# Patient Record
Sex: Female | Born: 1950 | Race: White | Hispanic: No | Marital: Single | Smoking: Never smoker
Health system: Southern US, Community
[De-identification: ages and names within clinical notes are randomized; demographics above are authoritative.]

## PROBLEM LIST (undated history)

## (undated) ENCOUNTER — Ambulatory Visit: Admission: EM

## (undated) DIAGNOSIS — M545 Low back pain, unspecified: Secondary | ICD-10-CM

## (undated) DIAGNOSIS — J45909 Unspecified asthma, uncomplicated: Secondary | ICD-10-CM

## (undated) DIAGNOSIS — K635 Polyp of colon: Secondary | ICD-10-CM

## (undated) DIAGNOSIS — J349 Unspecified disorder of nose and nasal sinuses: Secondary | ICD-10-CM

## (undated) DIAGNOSIS — K219 Gastro-esophageal reflux disease without esophagitis: Secondary | ICD-10-CM

## (undated) DIAGNOSIS — J40 Bronchitis, not specified as acute or chronic: Secondary | ICD-10-CM

## (undated) DIAGNOSIS — D509 Iron deficiency anemia, unspecified: Secondary | ICD-10-CM

## (undated) DIAGNOSIS — I10 Essential (primary) hypertension: Secondary | ICD-10-CM

## (undated) DIAGNOSIS — R7303 Prediabetes: Secondary | ICD-10-CM

## (undated) DIAGNOSIS — M1990 Unspecified osteoarthritis, unspecified site: Secondary | ICD-10-CM

## (undated) DIAGNOSIS — T7840XA Allergy, unspecified, initial encounter: Secondary | ICD-10-CM

## (undated) DIAGNOSIS — E538 Deficiency of other specified B group vitamins: Secondary | ICD-10-CM

## (undated) DIAGNOSIS — Z87442 Personal history of urinary calculi: Secondary | ICD-10-CM

## (undated) DIAGNOSIS — J189 Pneumonia, unspecified organism: Secondary | ICD-10-CM

## (undated) DIAGNOSIS — M549 Dorsalgia, unspecified: Secondary | ICD-10-CM

## (undated) HISTORY — DX: Low back pain, unspecified: M54.50

## (undated) HISTORY — DX: Dorsalgia, unspecified: M54.9

## (undated) HISTORY — PX: COLONOSCOPY: SHX174

## (undated) HISTORY — DX: Low back pain: M54.5

## (undated) HISTORY — PX: TONSILLECTOMY: SUR1361

## (undated) HISTORY — DX: Polyp of colon: K63.5

## (undated) HISTORY — DX: Deficiency of other specified B group vitamins: E53.8

## (undated) HISTORY — DX: Unspecified osteoarthritis, unspecified site: M19.90

## (undated) HISTORY — DX: Unspecified disorder of nose and nasal sinuses: J34.9

## (undated) HISTORY — DX: Gastro-esophageal reflux disease without esophagitis: K21.9

## (undated) HISTORY — DX: Unspecified asthma, uncomplicated: J45.909

## (undated) HISTORY — DX: Allergy, unspecified, initial encounter: T78.40XA

## (undated) HISTORY — PX: CHOLECYSTECTOMY: SHX55

## (undated) HISTORY — DX: Iron deficiency anemia, unspecified: D50.9

## (undated) HISTORY — PX: LUMBAR FUSION: SHX111

## (undated) HISTORY — DX: Essential (primary) hypertension: I10

---

## 1997-09-15 ENCOUNTER — Ambulatory Visit (HOSPITAL_COMMUNITY): Admission: RE | Admit: 1997-09-15 | Discharge: 1997-09-15 | Payer: Self-pay | Admitting: Internal Medicine

## 1997-09-20 ENCOUNTER — Ambulatory Visit (HOSPITAL_COMMUNITY): Admission: RE | Admit: 1997-09-20 | Discharge: 1997-09-20 | Payer: Self-pay | Admitting: General Surgery

## 1999-03-13 ENCOUNTER — Ambulatory Visit (HOSPITAL_COMMUNITY): Admission: RE | Admit: 1999-03-13 | Discharge: 1999-03-13 | Payer: Self-pay | Admitting: Internal Medicine

## 1999-03-13 ENCOUNTER — Other Ambulatory Visit: Admission: RE | Admit: 1999-03-13 | Discharge: 1999-03-13 | Payer: Self-pay | Admitting: Internal Medicine

## 1999-03-13 ENCOUNTER — Encounter: Payer: Self-pay | Admitting: Internal Medicine

## 1999-04-05 ENCOUNTER — Encounter: Admission: RE | Admit: 1999-04-05 | Discharge: 1999-04-05 | Payer: Self-pay | Admitting: Internal Medicine

## 1999-04-05 ENCOUNTER — Encounter: Payer: Self-pay | Admitting: Internal Medicine

## 2000-03-25 ENCOUNTER — Encounter: Admission: RE | Admit: 2000-03-25 | Discharge: 2000-03-25 | Payer: Self-pay | Admitting: Internal Medicine

## 2000-03-25 ENCOUNTER — Ambulatory Visit (HOSPITAL_COMMUNITY): Admission: RE | Admit: 2000-03-25 | Discharge: 2000-03-25 | Payer: Self-pay | Admitting: Internal Medicine

## 2000-03-25 ENCOUNTER — Other Ambulatory Visit: Admission: RE | Admit: 2000-03-25 | Discharge: 2000-03-25 | Payer: Self-pay | Admitting: Internal Medicine

## 2000-03-25 ENCOUNTER — Encounter: Payer: Self-pay | Admitting: Internal Medicine

## 2001-08-14 ENCOUNTER — Encounter: Payer: Self-pay | Admitting: Internal Medicine

## 2001-08-14 ENCOUNTER — Encounter: Admission: RE | Admit: 2001-08-14 | Discharge: 2001-08-14 | Payer: Self-pay | Admitting: Internal Medicine

## 2002-04-26 ENCOUNTER — Encounter: Payer: Self-pay | Admitting: Emergency Medicine

## 2002-04-26 ENCOUNTER — Emergency Department (HOSPITAL_COMMUNITY): Admission: EM | Admit: 2002-04-26 | Discharge: 2002-04-26 | Payer: Self-pay | Admitting: Emergency Medicine

## 2002-08-17 ENCOUNTER — Other Ambulatory Visit: Admission: RE | Admit: 2002-08-17 | Discharge: 2002-08-17 | Payer: Self-pay | Admitting: Internal Medicine

## 2002-09-23 ENCOUNTER — Other Ambulatory Visit: Admission: RE | Admit: 2002-09-23 | Discharge: 2002-09-23 | Payer: Self-pay | Admitting: Internal Medicine

## 2003-04-06 ENCOUNTER — Encounter: Admission: RE | Admit: 2003-04-06 | Discharge: 2003-04-06 | Payer: Self-pay | Admitting: Internal Medicine

## 2003-05-11 ENCOUNTER — Encounter: Admission: RE | Admit: 2003-05-11 | Discharge: 2003-06-17 | Payer: Self-pay | Admitting: Neurosurgery

## 2003-09-14 ENCOUNTER — Other Ambulatory Visit: Admission: RE | Admit: 2003-09-14 | Discharge: 2003-09-14 | Payer: Self-pay | Admitting: Internal Medicine

## 2003-10-06 DIAGNOSIS — K635 Polyp of colon: Secondary | ICD-10-CM | POA: Insufficient documentation

## 2003-10-06 HISTORY — PX: COLONOSCOPY W/ BIOPSIES: SHX1374

## 2003-10-06 HISTORY — DX: Polyp of colon: K63.5

## 2003-10-06 LAB — HM COLONOSCOPY: HM Colonoscopy: ABNORMAL

## 2003-12-10 ENCOUNTER — Ambulatory Visit: Payer: Self-pay | Admitting: Internal Medicine

## 2004-05-22 ENCOUNTER — Ambulatory Visit: Payer: Self-pay | Admitting: Internal Medicine

## 2004-08-30 ENCOUNTER — Other Ambulatory Visit: Admission: RE | Admit: 2004-08-30 | Discharge: 2004-08-30 | Payer: Self-pay | Admitting: *Deleted

## 2005-01-23 ENCOUNTER — Ambulatory Visit: Payer: Self-pay | Admitting: Internal Medicine

## 2005-04-03 ENCOUNTER — Ambulatory Visit: Payer: Self-pay | Admitting: Internal Medicine

## 2005-04-06 ENCOUNTER — Encounter: Admission: RE | Admit: 2005-04-06 | Discharge: 2005-04-06 | Payer: Self-pay | Admitting: Internal Medicine

## 2005-04-16 ENCOUNTER — Encounter: Admission: RE | Admit: 2005-04-16 | Discharge: 2005-04-16 | Payer: Self-pay | Admitting: Internal Medicine

## 2005-09-17 ENCOUNTER — Other Ambulatory Visit: Admission: RE | Admit: 2005-09-17 | Discharge: 2005-09-17 | Payer: Self-pay | Admitting: *Deleted

## 2006-02-26 HISTORY — PX: LUMBAR FUSION: SHX111

## 2006-03-25 ENCOUNTER — Ambulatory Visit: Payer: Self-pay | Admitting: Internal Medicine

## 2006-03-29 ENCOUNTER — Ambulatory Visit: Payer: Self-pay | Admitting: Internal Medicine

## 2006-05-17 ENCOUNTER — Encounter: Admission: RE | Admit: 2006-05-17 | Discharge: 2006-05-17 | Payer: Self-pay | Admitting: *Deleted

## 2006-09-19 ENCOUNTER — Other Ambulatory Visit: Admission: RE | Admit: 2006-09-19 | Discharge: 2006-09-19 | Payer: Self-pay | Admitting: *Deleted

## 2007-02-24 ENCOUNTER — Telehealth: Payer: Self-pay | Admitting: Internal Medicine

## 2007-02-27 HISTORY — PX: PARTIAL HIP ARTHROPLASTY: SHX733

## 2007-03-11 ENCOUNTER — Encounter: Payer: Self-pay | Admitting: Internal Medicine

## 2007-03-25 ENCOUNTER — Encounter: Payer: Self-pay | Admitting: Internal Medicine

## 2007-06-18 ENCOUNTER — Inpatient Hospital Stay (HOSPITAL_COMMUNITY): Admission: RE | Admit: 2007-06-18 | Discharge: 2007-06-21 | Payer: Self-pay | Admitting: Neurosurgery

## 2007-06-24 ENCOUNTER — Encounter: Admission: RE | Admit: 2007-06-24 | Discharge: 2007-06-24 | Payer: Self-pay | Admitting: Neurosurgery

## 2007-07-22 ENCOUNTER — Encounter: Payer: Self-pay | Admitting: Internal Medicine

## 2007-07-24 ENCOUNTER — Ambulatory Visit: Payer: Self-pay | Admitting: Internal Medicine

## 2007-07-24 DIAGNOSIS — M545 Low back pain, unspecified: Secondary | ICD-10-CM | POA: Insufficient documentation

## 2007-07-24 DIAGNOSIS — R609 Edema, unspecified: Secondary | ICD-10-CM | POA: Insufficient documentation

## 2007-07-24 DIAGNOSIS — K219 Gastro-esophageal reflux disease without esophagitis: Secondary | ICD-10-CM | POA: Insufficient documentation

## 2007-07-24 DIAGNOSIS — R339 Retention of urine, unspecified: Secondary | ICD-10-CM | POA: Insufficient documentation

## 2007-07-24 LAB — CONVERTED CEMR LAB
ALT: 19 units/L (ref 0–35)
AST: 29 units/L (ref 0–37)
Albumin: 3.6 g/dL (ref 3.5–5.2)
Alkaline Phosphatase: 45 units/L (ref 39–117)
BUN: 10 mg/dL (ref 6–23)
Basophils Absolute: 0 10*3/uL (ref 0.0–0.1)
Basophils Relative: 0.2 % (ref 0.0–1.0)
Bilirubin, Direct: 0.1 mg/dL (ref 0.0–0.3)
CO2: 25 meq/L (ref 19–32)
Calcium: 9.1 mg/dL (ref 8.4–10.5)
Chloride: 107 meq/L (ref 96–112)
Creatinine, Ser: 0.8 mg/dL (ref 0.4–1.2)
Eosinophils Absolute: 0.3 10*3/uL (ref 0.0–0.7)
Eosinophils Relative: 6 % — ABNORMAL HIGH (ref 0.0–5.0)
GFR calc Af Amer: 95 mL/min
GFR calc non Af Amer: 79 mL/min
Glucose, Bld: 78 mg/dL (ref 70–99)
HCT: 33 % — ABNORMAL LOW (ref 36.0–46.0)
Hemoglobin: 11 g/dL — ABNORMAL LOW (ref 12.0–15.0)
Lymphocytes Relative: 54.4 % — ABNORMAL HIGH (ref 12.0–46.0)
MCHC: 33.2 g/dL (ref 30.0–36.0)
MCV: 86 fL (ref 78.0–100.0)
Monocytes Absolute: 0.6 10*3/uL (ref 0.1–1.0)
Monocytes Relative: 10.1 % (ref 3.0–12.0)
Neutro Abs: 1.7 10*3/uL (ref 1.4–7.7)
Neutrophils Relative %: 29.3 % — ABNORMAL LOW (ref 43.0–77.0)
Platelets: 272 10*3/uL (ref 150–400)
Potassium: 4.3 meq/L (ref 3.5–5.1)
RBC: 3.84 M/uL — ABNORMAL LOW (ref 3.87–5.11)
RDW: 14.4 % (ref 11.5–14.6)
Sodium: 141 meq/L (ref 135–145)
Total Bilirubin: 0.5 mg/dL (ref 0.3–1.2)
Total Protein: 6.7 g/dL (ref 6.0–8.3)
WBC: 5.8 10*3/uL (ref 4.5–10.5)

## 2007-07-25 ENCOUNTER — Encounter: Payer: Self-pay | Admitting: Internal Medicine

## 2007-07-28 ENCOUNTER — Telehealth: Payer: Self-pay | Admitting: Internal Medicine

## 2007-07-30 ENCOUNTER — Encounter: Admission: RE | Admit: 2007-07-30 | Discharge: 2007-07-30 | Payer: Self-pay | Admitting: Internal Medicine

## 2007-07-31 ENCOUNTER — Telehealth: Payer: Self-pay | Admitting: *Deleted

## 2007-07-31 ENCOUNTER — Telehealth: Payer: Self-pay | Admitting: Internal Medicine

## 2007-08-07 ENCOUNTER — Telehealth: Payer: Self-pay | Admitting: Internal Medicine

## 2007-08-08 ENCOUNTER — Ambulatory Visit: Payer: Self-pay | Admitting: Internal Medicine

## 2007-08-08 LAB — CONVERTED CEMR LAB
Glucose, Urine, Semiquant: NEGATIVE
Ketones, urine, test strip: NEGATIVE
Nitrite: NEGATIVE
Protein, U semiquant: NEGATIVE
Specific Gravity, Urine: 1.015
Urobilinogen, UA: 0.2
pH: 5.5

## 2007-08-09 ENCOUNTER — Encounter: Payer: Self-pay | Admitting: Internal Medicine

## 2007-08-26 ENCOUNTER — Encounter: Payer: Self-pay | Admitting: Internal Medicine

## 2007-09-16 ENCOUNTER — Encounter: Payer: Self-pay | Admitting: Internal Medicine

## 2007-09-19 ENCOUNTER — Other Ambulatory Visit: Admission: RE | Admit: 2007-09-19 | Discharge: 2007-09-19 | Payer: Self-pay | Admitting: Gynecology

## 2007-10-08 ENCOUNTER — Ambulatory Visit: Payer: Self-pay | Admitting: Internal Medicine

## 2007-10-08 DIAGNOSIS — M1990 Unspecified osteoarthritis, unspecified site: Secondary | ICD-10-CM | POA: Insufficient documentation

## 2007-10-08 DIAGNOSIS — M169 Osteoarthritis of hip, unspecified: Secondary | ICD-10-CM | POA: Insufficient documentation

## 2007-10-08 DIAGNOSIS — I10 Essential (primary) hypertension: Secondary | ICD-10-CM

## 2007-10-08 HISTORY — DX: Essential (primary) hypertension: I10

## 2007-10-31 ENCOUNTER — Encounter: Payer: Self-pay | Admitting: Internal Medicine

## 2007-12-05 ENCOUNTER — Ambulatory Visit: Payer: Self-pay | Admitting: Internal Medicine

## 2008-02-16 ENCOUNTER — Ambulatory Visit: Payer: Self-pay | Admitting: Internal Medicine

## 2008-03-17 ENCOUNTER — Encounter: Payer: Self-pay | Admitting: Internal Medicine

## 2008-03-19 ENCOUNTER — Ambulatory Visit: Payer: Self-pay | Admitting: Internal Medicine

## 2008-03-19 DIAGNOSIS — M25559 Pain in unspecified hip: Secondary | ICD-10-CM | POA: Insufficient documentation

## 2008-03-19 DIAGNOSIS — M26622 Arthralgia of left temporomandibular joint: Secondary | ICD-10-CM | POA: Insufficient documentation

## 2008-03-29 ENCOUNTER — Encounter: Payer: Self-pay | Admitting: Internal Medicine

## 2008-03-29 ENCOUNTER — Ambulatory Visit: Payer: Self-pay

## 2008-03-29 ENCOUNTER — Encounter: Payer: Self-pay | Admitting: Cardiology

## 2008-04-12 ENCOUNTER — Ambulatory Visit: Payer: Self-pay | Admitting: Internal Medicine

## 2008-04-12 LAB — CONVERTED CEMR LAB
Bilirubin Urine: NEGATIVE
Glucose, Urine, Semiquant: NEGATIVE
Nitrite: NEGATIVE
Specific Gravity, Urine: 1.02
Urobilinogen, UA: 0.2
WBC Urine, dipstick: NEGATIVE
pH: 6

## 2008-04-13 ENCOUNTER — Encounter: Payer: Self-pay | Admitting: Internal Medicine

## 2008-05-08 ENCOUNTER — Encounter: Payer: Self-pay | Admitting: Internal Medicine

## 2008-06-16 ENCOUNTER — Ambulatory Visit: Payer: Self-pay | Admitting: Internal Medicine

## 2008-08-26 ENCOUNTER — Ambulatory Visit: Payer: Self-pay | Admitting: Internal Medicine

## 2008-08-26 LAB — CONVERTED CEMR LAB
ALT: 26 units/L (ref 0–35)
AST: 29 units/L (ref 0–37)
Albumin: 3.9 g/dL (ref 3.5–5.2)
Alkaline Phosphatase: 63 units/L (ref 39–117)
BUN: 12 mg/dL (ref 6–23)
Basophils Absolute: 0 10*3/uL (ref 0.0–0.1)
Basophils Relative: 0.8 % (ref 0.0–3.0)
Bilirubin Urine: NEGATIVE
Bilirubin, Direct: 0 mg/dL (ref 0.0–0.3)
CO2: 27 meq/L (ref 19–32)
Calcium: 9 mg/dL (ref 8.4–10.5)
Chloride: 103 meq/L (ref 96–112)
Cholesterol: 188 mg/dL (ref 0–200)
Creatinine, Ser: 0.7 mg/dL (ref 0.4–1.2)
Eosinophils Absolute: 0.1 10*3/uL (ref 0.0–0.7)
Eosinophils Relative: 2.4 % (ref 0.0–5.0)
Estradiol: 19.3 pg/mL
FSH: 70 milliintl units/mL
GFR calc non Af Amer: 91.41 mL/min (ref >60)
Glucose, Bld: 94 mg/dL (ref 70–99)
Glucose, Urine, Semiquant: NEGATIVE
HCT: 32.7 % — ABNORMAL LOW (ref 36.0–46.0)
HDL: 48.7 mg/dL (ref >39.00)
Hemoglobin: 10.7 g/dL — ABNORMAL LOW (ref 12.0–15.0)
Ketones, urine, test strip: NEGATIVE
LDL Cholesterol: 112 mg/dL — ABNORMAL HIGH (ref 0–99)
LH: 31.21 milliintl units/mL
Lymphocytes Relative: 54.1 % — ABNORMAL HIGH (ref 12.0–46.0)
Lymphs Abs: 2.7 10*3/uL (ref 0.7–4.0)
MCHC: 32.7 g/dL (ref 30.0–36.0)
MCV: 77.8 fL — ABNORMAL LOW (ref 78.0–100.0)
Monocytes Absolute: 0.4 10*3/uL (ref 0.1–1.0)
Monocytes Relative: 8.6 % (ref 3.0–12.0)
Neutro Abs: 1.6 10*3/uL (ref 1.4–7.7)
Neutrophils Relative %: 34.1 % — ABNORMAL LOW (ref 43.0–77.0)
Nitrite: NEGATIVE
Platelets: 284 10*3/uL (ref 150.0–400.0)
Potassium: 3.8 meq/L (ref 3.5–5.1)
Protein, U semiquant: NEGATIVE
RBC: 4.21 M/uL (ref 3.87–5.11)
RDW: 17.8 % — ABNORMAL HIGH (ref 11.5–14.6)
Sodium: 139 meq/L (ref 135–145)
Specific Gravity, Urine: 1.02
TSH: 1.67 microintl units/mL (ref 0.35–5.50)
Total Bilirubin: 0.6 mg/dL (ref 0.3–1.2)
Total CHOL/HDL Ratio: 4
Total Protein: 7.1 g/dL (ref 6.0–8.3)
Triglycerides: 138 mg/dL (ref 0.0–149.0)
Urobilinogen, UA: 0.2
VLDL: 27.6 mg/dL (ref 0.0–40.0)
WBC: 4.8 10*3/uL (ref 4.5–10.5)
pH: 5.5

## 2008-09-06 ENCOUNTER — Other Ambulatory Visit: Admission: RE | Admit: 2008-09-06 | Discharge: 2008-09-06 | Payer: Self-pay | Admitting: Internal Medicine

## 2008-09-06 ENCOUNTER — Ambulatory Visit: Payer: Self-pay | Admitting: Internal Medicine

## 2008-09-06 ENCOUNTER — Encounter: Payer: Self-pay | Admitting: Internal Medicine

## 2008-09-06 DIAGNOSIS — N951 Menopausal and female climacteric states: Secondary | ICD-10-CM | POA: Insufficient documentation

## 2008-09-21 ENCOUNTER — Encounter: Payer: Self-pay | Admitting: Internal Medicine

## 2009-02-12 ENCOUNTER — Ambulatory Visit: Payer: Self-pay | Admitting: Internal Medicine

## 2009-02-12 LAB — CONVERTED CEMR LAB
Bilirubin Urine: NEGATIVE
Blood in Urine, dipstick: NEGATIVE
Glucose, Urine, Semiquant: NEGATIVE
Ketones, urine, test strip: NEGATIVE
Nitrite: NEGATIVE
Specific Gravity, Urine: 1.025
Urobilinogen, UA: 0.2
WBC Urine, dipstick: NEGATIVE
pH: 6

## 2009-02-26 LAB — HM MAMMOGRAPHY: HM Mammogram: NORMAL

## 2009-03-14 ENCOUNTER — Telehealth: Payer: Self-pay | Admitting: Internal Medicine

## 2009-04-05 ENCOUNTER — Ambulatory Visit: Payer: Self-pay | Admitting: Internal Medicine

## 2009-04-05 DIAGNOSIS — J209 Acute bronchitis, unspecified: Secondary | ICD-10-CM | POA: Insufficient documentation

## 2009-04-06 ENCOUNTER — Telehealth: Payer: Self-pay | Admitting: Internal Medicine

## 2009-05-03 ENCOUNTER — Encounter: Payer: Self-pay | Admitting: Internal Medicine

## 2009-05-12 ENCOUNTER — Telehealth: Payer: Self-pay | Admitting: Internal Medicine

## 2009-06-29 ENCOUNTER — Ambulatory Visit: Payer: Self-pay | Admitting: Family Medicine

## 2009-06-29 ENCOUNTER — Telehealth: Payer: Self-pay | Admitting: Internal Medicine

## 2009-06-29 DIAGNOSIS — J309 Allergic rhinitis, unspecified: Secondary | ICD-10-CM | POA: Insufficient documentation

## 2009-06-29 HISTORY — DX: Allergic rhinitis, unspecified: J30.9

## 2009-07-01 ENCOUNTER — Telehealth: Payer: Self-pay | Admitting: Family Medicine

## 2009-07-01 LAB — CONVERTED CEMR LAB
ALT: 21 units/L (ref 0–35)
AST: 24 units/L (ref 0–37)
Albumin: 3.7 g/dL (ref 3.5–5.2)
Alkaline Phosphatase: 60 units/L (ref 39–117)
BUN: 14 mg/dL (ref 6–23)
Basophils Absolute: 0 10*3/uL (ref 0.0–0.1)
Basophils Relative: 0.4 % (ref 0.0–3.0)
Bilirubin, Direct: 0.1 mg/dL (ref 0.0–0.3)
CO2: 26 meq/L (ref 19–32)
Calcium: 9 mg/dL (ref 8.4–10.5)
Chloride: 107 meq/L (ref 96–112)
Creatinine, Ser: 0.6 mg/dL (ref 0.4–1.2)
Eosinophils Absolute: 0.1 10*3/uL (ref 0.0–0.7)
Eosinophils Relative: 2.8 % (ref 0.0–5.0)
GFR calc non Af Amer: 102.92 mL/min (ref >60)
Glucose, Bld: 106 mg/dL — ABNORMAL HIGH (ref 70–99)
HCT: 31.5 % — ABNORMAL LOW (ref 36.0–46.0)
Hemoglobin: 10.7 g/dL — ABNORMAL LOW (ref 12.0–15.0)
Lymphocytes Relative: 34.8 % (ref 12.0–46.0)
Lymphs Abs: 1.5 10*3/uL (ref 0.7–4.0)
MCHC: 34 g/dL (ref 30.0–36.0)
MCV: 80.9 fL (ref 78.0–100.0)
Monocytes Absolute: 0.4 10*3/uL (ref 0.1–1.0)
Monocytes Relative: 9.7 % (ref 3.0–12.0)
Neutro Abs: 2.2 10*3/uL (ref 1.4–7.7)
Neutrophils Relative %: 52.3 % (ref 43.0–77.0)
Platelets: 273 10*3/uL (ref 150.0–400.0)
Potassium: 3.6 meq/L (ref 3.5–5.1)
RBC: 3.9 M/uL (ref 3.87–5.11)
RDW: 17.5 % — ABNORMAL HIGH (ref 11.5–14.6)
Sodium: 141 meq/L (ref 135–145)
TSH: 1.09 microintl units/mL (ref 0.35–5.50)
Total Bilirubin: 0.2 mg/dL — ABNORMAL LOW (ref 0.3–1.2)
Total Protein: 6.8 g/dL (ref 6.0–8.3)
WBC: 4.3 10*3/uL — ABNORMAL LOW (ref 4.5–10.5)

## 2009-07-11 ENCOUNTER — Ambulatory Visit: Payer: Self-pay | Admitting: Internal Medicine

## 2009-07-11 LAB — CONVERTED CEMR LAB
OCCULT 1: NEGATIVE
OCCULT 2: NEGATIVE
OCCULT 3: NEGATIVE

## 2009-09-12 ENCOUNTER — Ambulatory Visit: Payer: Self-pay | Admitting: Internal Medicine

## 2009-09-12 LAB — CONVERTED CEMR LAB
ALT: 22 units/L (ref 0–35)
AST: 26 units/L (ref 0–37)
Albumin: 3.8 g/dL (ref 3.5–5.2)
Alkaline Phosphatase: 53 units/L (ref 39–117)
BUN: 14 mg/dL (ref 6–23)
Basophils Absolute: 0 10*3/uL (ref 0.0–0.1)
Basophils Relative: 0.6 % (ref 0.0–3.0)
Bilirubin Urine: NEGATIVE
Bilirubin, Direct: 0.1 mg/dL (ref 0.0–0.3)
Blood in Urine, dipstick: NEGATIVE
CO2: 31 meq/L (ref 19–32)
Calcium: 9.1 mg/dL (ref 8.4–10.5)
Chloride: 108 meq/L (ref 96–112)
Cholesterol: 188 mg/dL (ref 0–200)
Creatinine, Ser: 0.6 mg/dL (ref 0.4–1.2)
Direct LDL: 111.7 mg/dL
Eosinophils Absolute: 0.1 10*3/uL (ref 0.0–0.7)
Eosinophils Relative: 2.4 % (ref 0.0–5.0)
GFR calc non Af Amer: 102.85 mL/min (ref >60)
Glucose, Bld: 93 mg/dL (ref 70–99)
Glucose, Urine, Semiquant: NEGATIVE
HCT: 32.3 % — ABNORMAL LOW (ref 36.0–46.0)
HDL: 46.7 mg/dL (ref >39.00)
Hemoglobin: 10.9 g/dL — ABNORMAL LOW (ref 12.0–15.0)
Ketones, urine, test strip: NEGATIVE
Lymphocytes Relative: 51 % — ABNORMAL HIGH (ref 12.0–46.0)
Lymphs Abs: 2.3 10*3/uL (ref 0.7–4.0)
MCHC: 33.6 g/dL (ref 30.0–36.0)
MCV: 82.1 fL (ref 78.0–100.0)
Monocytes Absolute: 0.2 10*3/uL (ref 0.1–1.0)
Monocytes Relative: 5.4 % (ref 3.0–12.0)
Neutro Abs: 1.8 10*3/uL (ref 1.4–7.7)
Neutrophils Relative %: 40.6 % — ABNORMAL LOW (ref 43.0–77.0)
Nitrite: NEGATIVE
Platelets: 222 10*3/uL (ref 150.0–400.0)
Potassium: 4.7 meq/L (ref 3.5–5.1)
RBC: 3.94 M/uL (ref 3.87–5.11)
RDW: 17.7 % — ABNORMAL HIGH (ref 11.5–14.6)
Sodium: 140 meq/L (ref 135–145)
Specific Gravity, Urine: 1.025
TSH: 1.03 microintl units/mL (ref 0.35–5.50)
Total Bilirubin: 0.5 mg/dL (ref 0.3–1.2)
Total CHOL/HDL Ratio: 4
Total Protein: 6.6 g/dL (ref 6.0–8.3)
Triglycerides: 226 mg/dL — ABNORMAL HIGH (ref 0.0–149.0)
Urobilinogen, UA: 0.2
VLDL: 45.2 mg/dL — ABNORMAL HIGH (ref 0.0–40.0)
Vitamin B-12: 287 pg/mL (ref 211–911)
WBC Urine, dipstick: NEGATIVE
WBC: 4.5 10*3/uL (ref 4.5–10.5)
pH: 7.5

## 2009-09-21 ENCOUNTER — Other Ambulatory Visit: Admission: RE | Admit: 2009-09-21 | Discharge: 2009-09-21 | Payer: Self-pay | Admitting: Neurosurgery

## 2009-09-21 ENCOUNTER — Ambulatory Visit: Payer: Self-pay | Admitting: Internal Medicine

## 2009-09-21 DIAGNOSIS — E538 Deficiency of other specified B group vitamins: Secondary | ICD-10-CM | POA: Insufficient documentation

## 2009-09-27 ENCOUNTER — Encounter: Payer: Self-pay | Admitting: Internal Medicine

## 2009-09-28 LAB — CONVERTED CEMR LAB: Pap Smear: NEGATIVE

## 2010-01-10 ENCOUNTER — Ambulatory Visit: Payer: Self-pay | Admitting: Internal Medicine

## 2010-01-10 LAB — CONVERTED CEMR LAB
Basophils Absolute: 0 10*3/uL (ref 0.0–0.1)
Basophils Relative: 0.5 % (ref 0.0–3.0)
Bilirubin Urine: NEGATIVE
Eosinophils Absolute: 0.1 10*3/uL (ref 0.0–0.7)
Eosinophils Relative: 1.2 % (ref 0.0–5.0)
Glucose, Urine, Semiquant: NEGATIVE
HCT: 33.5 % — ABNORMAL LOW (ref 36.0–46.0)
Hemoglobin: 11.2 g/dL — ABNORMAL LOW (ref 12.0–15.0)
Lymphocytes Relative: 43.5 % (ref 12.0–46.0)
Lymphs Abs: 3.4 10*3/uL (ref 0.7–4.0)
MCHC: 33.3 g/dL (ref 30.0–36.0)
MCV: 85 fL (ref 78.0–100.0)
Monocytes Absolute: 0.5 10*3/uL (ref 0.1–1.0)
Monocytes Relative: 6.1 % (ref 3.0–12.0)
Neutro Abs: 3.8 10*3/uL (ref 1.4–7.7)
Neutrophils Relative %: 48.7 % (ref 43.0–77.0)
Nitrite: NEGATIVE
Platelets: 242 10*3/uL (ref 150.0–400.0)
RBC: 3.95 M/uL (ref 3.87–5.11)
RDW: 16.6 % — ABNORMAL HIGH (ref 11.5–14.6)
Specific Gravity, Urine: 1.025
Urobilinogen, UA: 0.2
Vitamin B-12: 1257 pg/mL — ABNORMAL HIGH (ref 211–911)
WBC: 7.8 10*3/uL (ref 4.5–10.5)
pH: 6.5

## 2010-01-16 ENCOUNTER — Telehealth: Payer: Self-pay | Admitting: Internal Medicine

## 2010-01-18 ENCOUNTER — Ambulatory Visit: Payer: Self-pay | Admitting: Internal Medicine

## 2010-03-23 ENCOUNTER — Ambulatory Visit
Admission: RE | Admit: 2010-03-23 | Discharge: 2010-03-23 | Payer: Self-pay | Source: Home / Self Care | Attending: Family Medicine | Admitting: Family Medicine

## 2010-03-30 NOTE — Progress Notes (Signed)
Summary: Diflucan  Phone Note Call from Patient   Caller: Patient Call For: Stacie Glaze MD Summary of Call: Pt. took antibiotics from dentist and would like Dr. Lovell Sheehan to prescribe Diflucan........she states she has a yeast infection. [phone] Initial call taken by: Lynann Beaver CMA,  May 12, 2009 1:10 PM    New/Updated Medications: DIFLUCAN 150 MG TABS (FLUCONAZOLE) one by mouth now Prescriptions: DIFLUCAN 150 MG TABS (FLUCONAZOLE) one by mouth now  #1 x 0   Entered by:   Lynann Beaver CMA   Authorized by:   Stacie Glaze MD   Signed by:   Lynann Beaver CMA on 05/12/2009   Method used:   [facility]        [facility] [address] [phone][facility] (retail)       [address]       [place], [place]  [address]       Ph: [phone] or [phone]       Fax: [phone]   RxID:   [id number]

## 2010-03-30 NOTE — Assessment & Plan Note (Signed)
Summary: 3 month rov/njr pt rsc/njr   Vital Signs:  Patient profile:   60 year old female Height:      66 inches Weight:      197 pounds BMI:     31.91 Temp:     98.8 degrees F oral Pulse rate:   72 / minute Resp:     14 per minute BP sitting:   132 / 70  (left arm)  Vitals Entered By: Willy Eddy, LPN (January 18, 2010 2:04 PM) CC: roa labs, Hypertension Management Is Patient Diabetic? No   Primary Care Provider:  Stacie Glaze MD  CC:  roa labs and Hypertension Management.  History of Present Illness: The pt had a UTI and cipro was caled in but she did not get the message today her symptoms are worse... some back pain but no chills or fever peristant worsening dysuria She has a hx of freguent UTI and states that her generalized pain is 7/10  follow up anemia with B12 and cbc monitering GI stable  Hypertension History:      She denies headache, chest pain, palpitations, dyspnea with exertion, orthopnea, PND, peripheral edema, visual symptoms, neurologic problems, syncope, and side effects from treatment.        Positive major cardiovascular risk factors include female age 46 years old or older and hypertension.  Negative major cardiovascular risk factors include negative family history for ischemic heart disease and non-tobacco-user status.     Preventive Screening-Counseling & Management  Alcohol-Tobacco     Smoking Status: never     Passive Smoke Exposure: no  Problems Prior to Update: 1)  B12 Deficiency  (ICD-266.2) 2)  Allergic Rhinitis  (ICD-477.9) 3)  Bronchitis, Acute With Mild Bronchospasm  (ICD-466.0) 4)  Influenza With Other Respiratory Manifestations  (ICD-487.1) 5)  Dysuria  (ICD-788.1) 6)  Symptomatic Menopausal/female Climacteric States  (ICD-627.2) 7)  Physical Examination  (ICD-V70.0) 8)  Acute Sinusitis, Unspecified  (ICD-461.9) 9)  Hip Pain  (ICD-719.45) 10)  Family History of Cad Female 1st Degree Relative <60  (ICD-V16.49) 11)  Family  History of Cad Female 1st Degree Relative <50  (ICD-V17.3) 12)  Hypertension  (ICD-401.9) 13)  Osteoarthritis  (ICD-715.90) 14)  Acute Cystitis  (ICD-595.0) 15)  Urinary Retention  (ICD-788.20) 16)  Edema  (ICD-782.3) 17)  Gerd  (ICD-530.81) 18)  Low Back Pain  (ICD-724.2)  Current Problems (verified): 1)  B12 Deficiency  (ICD-266.2) 2)  Allergic Rhinitis  (ICD-477.9) 3)  Bronchitis, Acute With Mild Bronchospasm  (ICD-466.0) 4)  Influenza With Other Respiratory Manifestations  (ICD-487.1) 5)  Dysuria  (ICD-788.1) 6)  Symptomatic Menopausal/female Climacteric States  (ICD-627.2) 7)  Physical Examination  (ICD-V70.0) 8)  Acute Sinusitis, Unspecified  (ICD-461.9) 9)  Hip Pain  (ICD-719.45) 10)  Family History of Cad Female 1st Degree Relative <60  (ICD-V16.49) 11)  Family History of Cad Female 1st Degree Relative <50  (ICD-V17.3) 12)  Hypertension  (ICD-401.9) 13)  Osteoarthritis  (ICD-715.90) 14)  Acute Cystitis  (ICD-595.0) 15)  Urinary Retention  (ICD-788.20) 16)  Edema  (ICD-782.3) 17)  Gerd  (ICD-530.81) 18)  Low Back Pain  (ICD-724.2)  Medications Prior to Update: 1)  Nexium 40 Mg  Cpdr (Esomeprazole Magnesium) .... Once Daily 2)  Vicodin Hp 10-660 Mg  Tabs (Hydrocodone-Acetaminophen) .... As Needed 3)  Micardis 40 Mg Tabs (Telmisartan) .... One By Mouth Daily 4)  Activella 0.5-0.1 Mg Tabs (Estradiol-Norethindrone Acet) .... One By Mouth Daily 5)  Multivitamins  Caps (Multiple Vitamin) .Marland KitchenMarland KitchenMarland Kitchen  1 Once Daily 6)  Folbee Plus  Tabs (B Complex-C-Folic Acid) .... One By Mouth Daily 7)  Fluconazole 150 Mg Tabs (Fluconazole) .[name].. 1 Today and Repeat in 5 Days 8)  Cipro 500 Mg Tabs (Ciprofloxacin Hcl) .... One By Mouth Two Times A Day X 10 Days  Current Medications (verified): 1)  Nexium 40 Mg  Cpdr (Esomeprazole Magnesium) .... Once Daily 2)  Vicodin Hp 10-660 Mg  Tabs (Hydrocodone-Acetaminophen) .... As Needed 3)  Micardis 40 Mg Tabs (Telmisartan) .... One By Mouth Daily 4)  Activella  0.5-0.1 Mg Tabs (Estradiol-Norethindrone Acet) .... One By Mouth Daily 5)  Multivitamins  Caps (Multiple Vitamin) .[name].. 1 Once Daily 6)  Folbee Plus  Tabs (B Complex-C-Folic Acid) .... One By Mouth Daily 7)  Fluconazole 150 Mg Tabs (Fluconazole) .[name].. 1 Today and Repeat in 5 Days 8)  Ciprofloxacin Hcl 500 Mg Tabs (Ciprofloxacin Hcl) .... One By Mouth Two Times A Day  Allergies (verified): 1)  ! * Protonix  Past History:  Family History: Last updated: 03/19/2008 Family History of CAD Female 1st degree relative <50   father diesd with COPD at 82 Family History of CAD Female 1st degree relative <60   CABG 34 Bother  50 with stent with 90 %  Social History: Last updated: 10/08/2007 Occupation: Runner, broadcasting/film/video Married  Risk Factors: Smoking Status: never (01/18/2010) Passive Smoke Exposure: no (01/18/2010)  Past medical, surgical, family and social histories (including risk factors) reviewed, and no changes noted (except as noted below).  Past Medical History: Reviewed history from 10/08/2007 and no changes required. Low back pain GERD Osteoarthritis Hypertension  Past Surgical History: Reviewed history from 06/16/2008 and no changes required. Lumbar fusion Cholecystectomy Tonsillectomy right hip replacement  Family History: Reviewed history from 03/19/2008 and no changes required. Family History of CAD Female 1st degree relative <50   father diesd with COPD at 45 Family History of CAD Female 1st degree relative <60   CABG 34 Bother  59 with stent with 90 %  Social History: Reviewed history from 10/08/2007 and no changes required. Occupation: Runner, broadcasting/film/video Married  Review of Systems  The patient denies anorexia, fever, weight loss, weight gain, vision loss, decreased hearing, hoarseness, chest pain, syncope, dyspnea on exertion, peripheral edema, prolonged cough, headaches, hemoptysis, abdominal pain, melena, hematochezia, severe indigestion/heartburn, hematuria, incontinence, genital  sores, muscle weakness, suspicious skin lesions, transient blindness, difficulty walking, depression, unusual weight change, abnormal bleeding, enlarged lymph nodes, angioedema, and breast masses.    Physical Exam  General:  alert and overweight-appearing.   Head:  normocephalic and atraumatic.   Ears:  R ear normal and L ear normal.   Mouth:  good dentition and pharynx pink and moist.   Neck:  No deformities, masses, or tenderness noted. Lungs:  Normal respiratory effort, chest expands symmetrically. Lungs are clear to auscultation, no crackles or wheezes. Heart:  Normal rate and regular rhythm. S1 and S2 normal without gallop, murmur, click, rub or other extra sounds. Abdomen:  Bowel sounds positive,abdomen soft and non-tender without masses, organomegaly or hernias noted. Msk:  No deformity or scoliosis noted of thoracic or lumbar spine.   Pulses:  nl cap refill  Extremities:  No clubbing, cyanosis, edema, or deformity noted with normal full range of motion of all joints.     Impression & Recommendations:  Problem # 1:  PYELONEPHRITIS, ACUTE (ICD-590.10) with the back pain and the il feeling and the length of infection she meets criteria will give rocephin 1 gm and cmplete the  cipro fro 7 additional days  Problem # 2:  B12 DEFICIENCY (ICD-266.2) improved  Problem # 3:  HYPERTENSION (ICD-401.9)  Her updated medication list for this problem includes:    Micardis 40 Mg Tabs (Telmisartan) ..... One by mouth daily  BP today: 132/70 Prior BP: 136/80 (09/21/2009)  10 Yr Risk Heart Disease: 9 % Prior 10 Yr Risk Heart Disease: 13 % (09/06/2008)  Labs Reviewed: K+: 4.7 (09/12/2009) Creat: : 0.6 (09/12/2009)   Chol: 188 (09/12/2009)   HDL: 46.70 (09/12/2009)   LDL: 112 (08/26/2008)   TG: 226.0 (09/12/2009)  Problem # 4:  OSTEOARTHRITIS (ICD-715.90)  Her updated medication list for this problem includes:    Vicodin Hp 10-660 Mg Tabs (Hydrocodone-acetaminophen) .[name]... As  needed  Discussed use of medications, application of heat or cold, and exercises.   Complete Medication List: 1)  Nexium 40 Mg Cpdr (Esomeprazole magnesium) .... Once daily 2)  Vicodin Hp 10-660 Mg Tabs (Hydrocodone-acetaminophen) .... As needed 3)  Micardis 40 Mg Tabs (Telmisartan) .... One by mouth daily 4)  Activella 0.5-0.1 Mg Tabs (Estradiol-norethindrone acet) .... One by mouth daily 5)  Multivitamins Caps (Multiple vitamin) .[name].. 1 once daily 6)  Folbee Plus Tabs (B complex-c-folic acid) .... One by mouth daily 7)  Fluconazole 150 Mg Tabs (Fluconazole) .[name].. 1 today and repeat in 5 days 8)  Ciprofloxacin Hcl 500 Mg Tabs (Ciprofloxacin hcl) .... One by mouth two times a day  Hypertension Assessment/Plan:      The patient's hypertensive risk group is category B: At least one risk factor (excluding diabetes) with no target organ damage.  Her calculated 10 year risk of coronary heart disease is 9 %.  Today's blood pressure is 132/70.  Her blood pressure goal is < 140/90.  Patient Instructions: 1)  July CPX Prescriptions: ACTIVELLA 0.5-0.1 MG TABS (ESTRADIOL-NORETHINDRONE ACET) one by mouth daily  #90 x 3   Entered and Authorized by:   Stacie Glaze MD   Signed by:   Stacie Glaze MD on 01/18/2010   Method used:   Faxed to ...       [facility] (mail-order)             , [place]         Ph: [phone]       Fax: [phone]   RxID:   [id number] NEXIUM 40 MG  CPDR (ESOMEPRAZOLE MAGNESIUM) once daily  #90 x 3   Entered and Authorized by:   Stacie Glaze MD   Signed by:   Stacie Glaze MD on 01/18/2010   Method used:   Faxed to ...       [facility] (mail-order)             , [place]         Ph: [phone]       Fax: [phone]   RxID:   [id number] FLUCONAZOLE 150 MG TABS (FLUCONAZOLE) 1 today and repeat in 5 days  #2 x 0   Entered and Authorized by:   Stacie Glaze MD   Signed by:   Stacie Glaze MD on 01/18/2010   Method used:   [facility] [address] [phone]* (retail)       [address]       [place], [place]  [id number]       Ph: [phone] or [phone]  Fax: [phone]   RxID:   [id number] CIPROFLOXACIN HCL 500 MG TABS (CIPROFLOXACIN HCL) one by mouth two times a day  #14 x 0   Entered and Authorized by:   Stacie Glaze MD   Signed by:   Stacie Glaze MD on 01/18/2010   Method used:   [facility]        [facility] [address] [phone]* (retail)       [address]       [place], [place]  [id number]       Ph: [phone] or [phone]       Fax: [phone]   RxID:   [id number]    Orders Added: 1)  Est. Patient Level IV [93235]  Appended Document: Orders Update    Clinical Lists Changes  Orders: Added new Service order of Rocephin  250mg  (T7322) - Signed Added new Service order of Admin of Therapeutic Inj  intramuscular or subcutaneous (02542) - Signed       Medication Administration  Injection # 1:    Medication: Rocephin  250mg     Diagnosis: PYELONEPHRITIS, ACUTE (ICD-590.10)    Route: IM    Site: LUOQ gluteus    Exp Date: 07/25/2012    Lot #: [id number]    Mfr: Nov[employer]is    Comments: 1gm given    Patient tolerated injection without complications    Given by: Willy Eddy, LPN (January 18, 2010 2:34 PM)  Orders Added: 1)  Rocephin  250mg  [J0696] 2)  Admin of Therapeutic Inj  intramuscular or subcutaneous [62831]

## 2010-03-30 NOTE — Progress Notes (Signed)
Summary: lab results  Phone Note Call from Patient   Caller: Patient Call For: Stacie Glaze MD Summary of Call: Pt would like lab results called to this number and leave a message, please. [phone] Initial call taken by: Lynann Beaver CMA,  Jul 01, 2009 10:26 AM  Follow-up for Phone Call        pt notified by phone . Follow-up by: Evelena Peat MD,  Jul 01, 2009 10:37 AM

## 2010-03-30 NOTE — Assessment & Plan Note (Signed)
Summary: cpx--pap//--ok per bonnye//ccm   Vital Signs:  Patient profile:   60 year old female Height:      66 inches Weight:      198 pounds BMI:     32.07 Temp:     98.2 degrees F oral Pulse rate:   80 / minute Resp:     14 per minute BP sitting:   136 / 80  (left arm)  Vitals Entered By: Willy Eddy, LPN (September 21, 2009 10:42 AM) CC: cpx and pap- needs mammogram Is Patient Diabetic? No   CC:  cpx and pap- needs mammogram.  History of Present Illness: Has been feeling exhausted has noted decreased indurance she notes a visit wiht Dr Caryl Never in which she was diagnosed with anemia she has been taking MV and this has helped some iron pill have in the past constipated her, negative stool cards. The pt was asked about all immunizations, health maint. services that are appropriate to their age and was given guidance on diet exercize  and weight management   Preventive Screening-Counseling & Management  Alcohol-Tobacco     Smoking Status: never     Passive Smoke Exposure: no  Problems Prior to Update: 1)  Allergic Rhinitis  (ICD-477.9) 2)  Bronchitis, Acute With Mild Bronchospasm  (ICD-466.0) 3)  Influenza With Other Respiratory Manifestations  (ICD-487.1) 4)  Dysuria  (ICD-788.1) 5)  Symptomatic Menopausal/female Climacteric States  (ICD-627.2) 6)  Physical Examination  (ICD-V70.0) 7)  Acute Sinusitis, Unspecified  (ICD-461.9) 8)  Hip Pain  (ICD-719.45) 9)  Family History of Cad Female 1st Degree Relative <60  (ICD-V16.49) 10)  Family History of Cad Female 1st Degree Relative <50  (ICD-V17.3) 11)  Hypertension  (ICD-401.9) 12)  Osteoarthritis  (ICD-715.90) 13)  Acute Cystitis  (ICD-595.0) 14)  Urinary Retention  (ICD-788.20) 15)  Edema  (ICD-782.3) 16)  Gerd  (ICD-530.81) 17)  Low Back Pain  (ICD-724.2)  Current Problems (verified): 1)  Allergic Rhinitis  (ICD-477.9) 2)  Bronchitis, Acute With Mild Bronchospasm  (ICD-466.0) 3)  Influenza With Other  Respiratory Manifestations  (ICD-487.1) 4)  Dysuria  (ICD-788.1) 5)  Symptomatic Menopausal/female Climacteric States  (ICD-627.2) 6)  Physical Examination  (ICD-V70.0) 7)  Acute Sinusitis, Unspecified  (ICD-461.9) 8)  Hip Pain  (ICD-719.45) 9)  Family History of Cad Female 1st Degree Relative <60  (ICD-V16.49) 10)  Family History of Cad Female 1st Degree Relative <50  (ICD-V17.3) 11)  Hypertension  (ICD-401.9) 12)  Osteoarthritis  (ICD-715.90) 13)  Acute Cystitis  (ICD-595.0) 14)  Urinary Retention  (ICD-788.20) 15)  Edema  (ICD-782.3) 16)  Gerd  (ICD-530.81) 17)  Low Back Pain  (ICD-724.2)  Medications Prior to Update: 1)  Nexium 40 Mg  Cpdr (Esomeprazole Magnesium) .... Once Daily 2)  Vicodin Hp 10-660 Mg  Tabs (Hydrocodone-Acetaminophen) .... As Needed 3)  Fish Oil Concentrate 1000 Mg  Caps (Omega-3 Fatty Acids) .... Two Caps By Mouth Bid 4)  Micardis 40 Mg Tabs (Telmisartan) .... One By Mouth Daily 5)  Nasonex 50 Mcg/act Susp (Mometasone Furoate) .... Two Spray in Each Nostril Daily 6)  Activella 0.5-0.1 Mg Tabs (Estradiol-Norethindrone Acet) .... One By Mouth Daily 7)  Azithromycin 250 Mg Tabs (Azithromycin) .... Two By Mouth Now Then On By Mouth Daily For 4 Days 8)  Atuss Ds 30-4-30 Mg/76ml Susp (Pseudoephed Hcl-Cpm-Dm Hbr Tan) .... Two Tsp By Mouth Two Times A Day 9)  Diflucan 150 Mg Tabs (Fluconazole) .... One By Mouth Now  Current Medications (verified): 1)  Nexium 40 Mg  Cpdr (Esomeprazole Magnesium) .... Once Daily 2)  Vicodin Hp 10-660 Mg  Tabs (Hydrocodone-Acetaminophen) .... As Needed 3)  Micardis 40 Mg Tabs (Telmisartan) .... One By Mouth Daily 4)  Activella 0.5-0.1 Mg Tabs (Estradiol-Norethindrone Acet) .... One By Mouth Daily 5)  Multivitamins  Caps (Multiple Vitamin) .[name].. 1 Once Daily 6)  Folbee Plus  Tabs (B Complex-C-Folic Acid) .... One By Mouth Daily  Allergies (verified): 1)  ! * Protonix  Past History:  Family History: Last updated: 03/19/2008 Family  History of CAD Female 1st degree relative <50   father diesd with COPD at 49 Family History of CAD Female 1st degree relative <60   CABG 80 Bother  41 with stent with 90 %  Social History: Last updated: 10/08/2007 Occupation: Runner, broadcasting/film/video Married  Risk Factors: Smoking Status: never (09/21/2009) Passive Smoke Exposure: no (09/21/2009)  Past medical, surgical, family and social histories (including risk factors) reviewed, and no changes noted (except as noted below).  Past Medical History: Reviewed history from 10/08/2007 and no changes required. Low back pain GERD Osteoarthritis Hypertension  Past Surgical History: Reviewed history from 06/16/2008 and no changes required. Lumbar fusion Cholecystectomy Tonsillectomy right hip replacement  Family History: Reviewed history from 03/19/2008 and no changes required. Family History of CAD Female 1st degree relative <50   father diesd with COPD at 65 Family History of CAD Female 1st degree relative <60   CABG 47 Bother  87 with stent with 90 %  Social History: Reviewed history from 10/08/2007 and no changes required. Occupation: Runner, broadcasting/film/video Married  Review of Systems       The patient complains of weight gain and peripheral edema.  The patient denies anorexia, fever, weight loss, vision loss, decreased hearing, hoarseness, chest pain, syncope, dyspnea on exertion, prolonged cough, headaches, hemoptysis, abdominal pain, melena, hematochezia, severe indigestion/heartburn, hematuria, incontinence, genital sores, muscle weakness, suspicious skin lesions, transient blindness, difficulty walking, depression, unusual weight change, abnormal bleeding, enlarged lymph nodes, angioedema, and breast masses.    Physical Exam  General:  alert and overweight-appearing.   Head:  normocephalic and atraumatic.   Eyes:  pupils equal and pupils round.   Ears:  R ear normal and L ear normal.   Nose:  no external deformity and no nasal discharge.   Mouth:   good dentition and pharynx pink and moist.   Neck:  No deformities, masses, or tenderness noted. Lungs:  Normal respiratory effort, chest expands symmetrically. Lungs are clear to auscultation, no crackles or wheezes. Heart:  Normal rate and regular rhythm. S1 and S2 normal without gallop, murmur, click, rub or other extra sounds. Abdomen:  Bowel sounds positive,abdomen soft and non-tender without masses, organomegaly or hernias noted. Msk:  No deformity or scoliosis noted of thoracic or lumbar spine.   Pulses:  nl cap refill  Extremities:  No clubbing, cyanosis, edema, or deformity noted with normal full range of motion of all joints.   Neurologic:  alert & oriented X3, DTRs symmetrical and normal, and finger-to-nose normal.   Skin:  turgor normal and color normal.   Cervical Nodes:  No lymphadenopathy noted Axillary Nodes:  No palpable lymphadenopathy Inguinal Nodes:  No significant adenopathy Psych:  Cognition and judgment appear intact. Alert and cooperative with normal attention span and concentration. No apparent delusions, illusions, hallucinations   Impression & Recommendations:  Problem # 1:  PHYSICAL EXAMINATION (ICD-V70.0) Assessment Unchanged  The pt was asked about all immunizations, health maint. services that are appropriate to their age  and was given guidance on diet exercize  and weight management   Mammogram: normal (02/26/2008) Pap smear: NEGATIVE FOR INTRAEPITHELIAL LESIONS OR MALIGNANCY. (09/06/2008) Colonoscopy: abnormal (10/06/2003) Td Booster: Tdap (09/06/2008)   Chol: 188 (09/12/2009)   HDL: 46.70 (09/12/2009)   LDL: 112 (08/26/2008)   TG: 226.0 (09/12/2009) TSH: 1.03 (09/12/2009)   Next mammogram due:: 02/2009 (09/06/2008) Next Colonoscopy due:: 09/2013 (09/06/2008)  Discussed using sunscreen, use of alcohol, drug use, self breast exam, routine dental care, routine eye care, schedule for GYN exam, routine physical exam, seat belts, multiple vitamins,  osteoporosis prevention, adequate calcium intake in diet, recommendations for immunizations, mammograms and Pap smears.  Discussed exercise and checking cholesterol.  Discussed gun safety, safe sex, and contraception.  Problem # 2:  B12 DEFICIENCY (ICD-266.2) increased fatique change in b12 injectons Orders: Vit B12 1000 mcg (J3420) Admin of Therapeutic Inj  intramuscular or subcutaneous (16109)  Problem # 3:  HYPERTENSION (ICD-401.9)  Her updated medication list for this problem includes:    Micardis 40 Mg Tabs (Telmisartan) ..... One by mouth daily  BP today: 136/80 Prior BP: 110/70 (06/29/2009)  Prior 10 Yr Risk Heart Disease: 13 % (09/06/2008)  Labs Reviewed: K+: 4.7 (09/12/2009) Creat: : 0.6 (09/12/2009)   Chol: 188 (09/12/2009)   HDL: 46.70 (09/12/2009)   LDL: 112 (08/26/2008)   TG: 226.0 (09/12/2009)  Complete Medication List: 1)  Nexium 40 Mg Cpdr (Esomeprazole magnesium) .... Once daily 2)  Vicodin Hp 10-660 Mg Tabs (Hydrocodone-acetaminophen) .... As needed 3)  Micardis 40 Mg Tabs (Telmisartan) .... One by mouth daily 4)  Activella 0.5-0.1 Mg Tabs (Estradiol-norethindrone acet) .... One by mouth daily 5)  Multivitamins Caps (Multiple vitamin) .[name].. 1 once daily 6)  Folbee Plus Tabs (B complex-c-folic acid) .... One by mouth daily  Patient Instructions: 1)  wieght loss 2)  take the b12 vitamins 3)  and add iron to your diet 4)  consider optifast at [facility] 5)  CBC w/ Diff prior to visit, ICD-9:  and b12   280.0 Prescriptions: FOLBEE PLUS  TABS (B COMPLEX-C-FOLIC ACID) one by mouth daily  #90 x 3   Entered by:   Willy Eddy, LPN   Authorized by:   Stacie Glaze MD   Signed by:   Willy Eddy, LPN on 60/45/4098   Method used:   Electronically to        [facility]* (retail)             ,          Ph: [phone]       Fax: [phone]   RxID:   [id number] ACTIVELLA 0.5-0.1 MG TABS (ESTRADIOL-NORETHINDRONE ACET) one by mouth daily   #90 x 3   Entered by:   Willy Eddy, LPN   Authorized by:   Stacie Glaze MD   Signed by:   Willy Eddy, LPN on 12/23/2534   Method used:   Electronically to        [facility]* (retail)             ,          Ph: [phone]       Fax: [phone]   RxID:   [id number] MICARDIS 40 MG TABS (TELMISARTAN) one by mouth daily  #90 x 3   Entered by:   Willy Eddy, LPN   Authorized by:   Stacie Glaze MD   Signed by:   Willy Eddy, LPN  on 09/22/2009   Method used:   Electronically to        [facility]* (retail)             ,          Ph: [phone]       Fax: [phone]   RxID:   [id number] NEXIUM 40 MG  CPDR (ESOMEPRAZOLE MAGNESIUM) once daily  #90 x 3   Entered by:   Willy Eddy, LPN   Authorized by:   Stacie Glaze MD   Signed by:   Willy Eddy, LPN on 12/23/2534   Method used:   Electronically to        [facility]* (retail)             ,          Ph: [phone]       Fax: [phone]   RxID:   [id number] ACTIVELLA 0.5-0.1 MG TABS (ESTRADIOL-NORETHINDRONE ACET) one by mouth daily  #90 x 3   Entered and Authorized by:   Stacie Glaze MD   Signed by:   Stacie Glaze MD on 09/21/2009   Method used:   Faxed to ...       [facility] (mail-order)             , [place]         Ph: [phone]       Fax: [phone]   RxID:   [id number] MICARDIS 40 MG TABS (TELMISARTAN) one by mouth daily  #90 x 3   Entered and Authorized by:   Stacie Glaze MD   Signed by:   Stacie Glaze MD on 09/21/2009   Method used:   Faxed to ...       [facility] (mail-order)             , [place]         Ph: [phone]       Fax: [phone]   RxID:   [id number] NEXIUM 40 MG  CPDR (ESOMEPRAZOLE MAGNESIUM) once daily  #90 x 3   Entered and Authorized by:   Stacie Glaze MD   Signed by:   Stacie Glaze MD on 09/21/2009   Method used:   Faxed to ...       [facility] (mail-order)             , [place]         Ph: [phone]       Fax:  [phone]   RxID:   [id number] FOLBEE PLUS  TABS (B COMPLEX-C-FOLIC ACID) one by mouth daily  #90 x 3   Entered and Authorized by:   Stacie Glaze MD   Signed by:   Stacie Glaze MD on 09/21/2009   Method used:   Faxed to ...       [facility] (mail-order)             , [place]         Ph: [phone]       Fax: [phone]   RxID:   [id number]    Medication Administration  Injection # 1:    Medication: Vit B12 1000 mcg    Diagnosis: B12 DEFICIENCY (ICD-266.2)    Route: IM    Site: L deltoid    Exp Date: 01/26/2011    Lot #: [id number]    Mfr: [employer] Regent    Comments: 1.44ml given    Patient  tolerated injection without complications    Given by: Willy Eddy, LPN (September 21, 2009 1:34 PM)  Orders Added: 1)  Vit B12 1000 mcg [J3420] 2)  Admin of Therapeutic Inj  intramuscular or subcutaneous [96372] 3)  Est. Patient 40-64 years [99396] 4)  Est. Patient Level II [04540]

## 2010-03-30 NOTE — Progress Notes (Signed)
Summary: meds not at drugstore  Phone Note Call from Patient   Summary of Call: Meds from ov not at [facility] [address]  Called in. Initial call taken by: Rudy Jew, RN,  April 06, 2009 2:41 PM

## 2010-03-30 NOTE — Progress Notes (Signed)
Summary: Pt req work in appt with Dr. Lovell Sheehan today  Phone Note Call from Patient Call back at Home Phone [phone]   Caller: Patient Reason for Call: Acute Illness Summary of Call: Pt has diarrhea for several days, no energy, hair falling out. Pt wants work in appt with Dr Lovell Sheehan today.  Initial call taken by: Lucy Antigua,  Jun 29, 2009 8:39 AM  Follow-up for Phone Call        appointm,ent given for today with dr burchette--decline d ov with dr Lovell Sheehan for tomorrow,stating she had to be seen today Follow-up by: Willy Eddy, LPN,  Jun 29, 1025 9:02 AM

## 2010-03-30 NOTE — Progress Notes (Signed)
Summary: Pt has sinus inf. Req zpak call in [facility] [address]  Phone Note Call from Patient Call back at [facility] Phone [phone]   Caller: Patient Summary of Call: Pt called and has a sinus inf. Pt [facility] [address]  Initial call taken by: Lucy Antigua,  March 14, 2009 2:34 PM  Follow-up for Phone Call        may have z pack per dr Lovell Sheehan Follow-up by: Willy Eddy, LPN,  March 14, 2009 2:39 PM  Additional Follow-up for Phone Call Additional follow up Details #1::        please call pt and tell her z pack was called in- thanks Additional Follow-up by: Willy Eddy, LPN,  March 14, 2009 3:02 PM    Additional Follow-up for Phone Call Additional follow up Details #2::    I called pt and notified pt that zpak has been called in as noted above.  Follow-up by: Lucy Antigua,  March 14, 2009 4:24 PM  New/Updated Medications: ZITHROMAX Z-PAK 250 MG TABS (AZITHROMYCIN) take as directed Prescriptions: ZITHROMAX Z-PAK 250 MG TABS (AZITHROMYCIN) take as directed  #1 x 0   Entered by:   Willy Eddy, LPN   Authorized by:   Stacie Glaze MD   Signed by:   Willy Eddy, LPN on 36/64/4034   Method used:   [facility]  [address] [phone][facility] (retail[facility]       [address]       [place], [place]  [id number]       Ph: [phone] or [phone]       Fax: [phone]   RxID:   [id number]

## 2010-03-30 NOTE — Letter (Signed)
Summary: [facility]  [facility]   Imported By: Maryln Gottron 05/26/2009 12:31:31  _____________________________________________________________________  External Attachment:    Type:   Image     Comment:   External Document

## 2010-03-30 NOTE — Assessment & Plan Note (Signed)
Summary: diarrhea,fatigoue/declined appointment with drjenkins tomorro...   Vital Signs:  Patient profile:   60 year old female Weight:      200 pounds Temp:     98.5 degrees F oral BP sitting:   110 / 70  (left arm) Cuff size:   large  Vitals Entered By: Sid Falcon LPN (Jun 29, 1189 4:03 PM) CC: diarrhea, headache   History of Present Illness: Patient seen today for the following.  Two day history of nonbloody diarrhea. 3-4 loose stools per day. Denies any nausea, vomiting, abdominal pain, recent travels, or antibiotics. Has not taken any Imodium or other medication.  colonoscopy 5 years ago normal.  Prior cholecystectomy  Extreme fatigue in the past couple months. Has noticed some diffuse alopecia for the past couple months as well. Generally sleeping well. No increased stressors. Denies depressive symptoms. Does have some significant allergy symptoms with nasal congestion. Not taking any allergy medications.  Had lab work last July with anemia hemoglobin 10 range. Thyroid functions normal. Denies any appetite or weight changes.  Allergies: 1)  ! * Protonix  Past History:  Past Medical History: Last updated: 10/08/2007 Low back pain GERD Osteoarthritis Hypertension  Past Surgical History: Last updated: 06/16/2008 Lumbar fusion Cholecystectomy Tonsillectomy right hip replacement  Family History: Last updated: 03/19/2008 Family History of CAD Female 1st degree relative <50   father diesd with COPD at 63 Family History of CAD Female 1st degree relative <60   CABG 65 Bother  58 with stent with 90 %  Social History: Last updated: 10/08/2007 Occupation: Runner, broadcasting/film/video Married  Risk Factors: Smoking Status: never (04/05/2009) Passive Smoke Exposure: no (04/05/2009)  Review of Systems  The patient denies anorexia, fever, weight loss, chest pain, syncope, dyspnea on exertion, peripheral edema, prolonged cough, headaches, hemoptysis, abdominal pain, melena, hematochezia,  and severe indigestion/heartburn.    Physical Exam  General:  Well-developed,well-nourished,in no acute distress; alert,appropriate and cooperative throughout examination Head:  Normocephalic and atraumatic without obvious abnormalities. No apparent alopecia or balding. Ears:  External ear exam shows no significant lesions or deformities.  Otoscopic examination reveals clear canals, tympanic membranes are intact bilaterally without bulging, retraction, inflammation or discharge. Hearing is grossly normal bilaterally. Nose:  External nasal examination shows no deformity or inflammation. Nasal mucosa are pink and moist without lesions or exudates. Mouth:  Oral mucosa and oropharynx without lesions or exudates.  Teeth in good repair. Neck:  No deformities, masses, or tenderness noted. Lungs:  Normal respiratory effort, chest expands symmetrically. Lungs are clear to auscultation, no crackles or wheezes. Heart:  Normal rate and regular rhythm. S1 and S2 normal without gallop, murmur, click, rub or other extra sounds. Abdomen:  Bowel sounds positive,abdomen soft and non-tender without masses, organomegaly or hernias noted. Extremities:  No clubbing, cyanosis, edema, or deformity noted with normal full range of motion of all joints.   Skin:  no rash   Impression & Recommendations:  Problem # 1:  DIARRHEA (ICD-787.91) Assessment New ?viral .  Pt does not appear dehydrated.  Try OTC Imodium. Orders: TLB-CBC Platelet - w/Differential (85025-CBCD) TLB-BMP (Basic Metabolic Panel-BMET) (80048-METABOL) TLB-Hepatic/Liver Function Pnl (80076-HEPATIC) TLB-TSH (Thyroid Stimulating Hormone) (84443-TSH) Venipuncture (47829)  Problem # 2:  FATIGUE (ICD-780.79) Assessment: New Check labs (esp repeat hgb with prior low). Orders: TLB-CBC Platelet - w/Differential (85025-CBCD) TLB-BMP (Basic Metabolic Panel-BMET) (80048-METABOL) TLB-Hepatic/Liver Function Pnl (80076-HEPATIC) TLB-TSH (Thyroid Stimulating  Hormone) (84443-TSH) Venipuncture (56213)  Problem # 3:  ALLERGIC RHINITIS (ICD-477.9) Assessment: Deteriorated try over-the-counter Allegra Her updated medication  list for this problem includes:    Nasonex 50 Mcg/act Susp (Mometasone furoate) .[name]..[name] Two spray in each nostril daily  Problem # 4:  ALOPECIA (ICD-704.00) Assessment: New by hx.  None evident on exam.  Check TSH.  Complete Medication List: 1)  Nexium 40 Mg Cpdr (Esomeprazole magnesium) .... Once daily 2)  Vicodin Hp 10-660 Mg Tabs (Hydrocodone-acetaminophen) .... As needed 3)  Fish Oil Concentrate 1000 Mg Caps (Omega-3 fatty acids) .... Two caps by mouth bid 4)  Micardis 40 Mg Tabs (Telmisartan) .... One by mouth daily 5)  Nasonex 50 Mcg/act Susp (Mometasone furoate) .... Two spray in each nostril daily 6)  Activella 0.5-0.1 Mg Tabs (Estradiol-norethindrone acet) .... One by mouth daily 7)  Azithromycin 250 Mg Tabs (Azithromycin) .... Two by mouth now then on by mouth daily for 4 days 8)  Atuss Ds 30-4-30 Mg/41ml Susp (Pseudoephed hcl-cpm-dm hbr tan) .... Two tsp by mouth two times a day 9)  Diflucan 150 Mg Tabs (Fluconazole) .... One by mouth now  Patient Instructions: 1)  try over-the-counter Allegra one tablet daily 2)  Consider Imodium as needed for diarrhea 3)  Eat more potassium rich foods such as bananas, oranje juice, and salt substitutes .

## 2010-03-30 NOTE — Consult Note (Signed)
Summary: [facility]  [facility]   Imported By: Maryln Gottron 05/06/2007 13:49:49  _____________________________________________________________________  External Attachment:    Type:   Image     Comment:   External Document

## 2010-03-30 NOTE — Letter (Signed)
Summary: Out of Work  Adult nurse at [employer]  [address]   [place], [place] [address]   Phone: [phone]  Fax: [phone]    April 05, 2009   Employee:  PELAGIA [id number]    To Whom It May Concern:   For Medical reasons, please excuse the above named employee from work for the following dates:  Start:   04/05/2009  End:   04/11/2009  return to work monday ( flu)  If you need additional information, please feel free to contact our office.         Sincerely,    Stacie Glaze MD

## 2010-03-30 NOTE — Consult Note (Signed)
Summary: [facility]  [facility] [place]   Imported By: Lanelle Bal 09/04/2007 15:27:51  _____________________________________________________________________  External Attachment:    Type:   Image     Comment:   External Document

## 2010-03-30 NOTE — Assessment & Plan Note (Signed)
Summary: ST, COUGH, CONGESTION, FEVER // RS   Vital Signs:  Patient profile:   60 year old female Weight:      197 pounds Temp:     98.6 degrees F oral BP sitting:   140 / 80  (left arm) Cuff size:   regular  Vitals Entered By: Sid Falcon LPN (March 23, 2010 11:52 AM)  History of Present Illness: Patient seen with one-day history of sore throat. Two-week history of postnasal drainage, mostly dry cough, and facial pain right maxillary greater than left. No fever. Delsym cough syrup with some improvement in cough. Persistent postnasal drainage. No alleviating factors.  Allergies: 1)  ! * Protonix  Past History:  Past Medical History: Last updated: 10/08/2007 Low back pain GERD Osteoarthritis Hypertension  Past Surgical History: Last updated: 06/16/2008 Lumbar fusion Cholecystectomy Tonsillectomy right hip replacement  Family History: Last updated: 03/19/2008 Family History of CAD Female 1st degree relative <50   father diesd with COPD at 24 Family History of CAD Female 1st degree relative <60   CABG 46 Bother  58 with stent with 90 %  Social History: Last updated: 10/08/2007 Occupation: Runner, broadcasting/film/video Married  Risk Factors: Smoking Status: never (01/18/2010) Passive Smoke Exposure: no (01/18/2010) PMH-FH-SH reviewed for relevance  Review of Systems  The patient denies fever, hoarseness, prolonged cough, and hemoptysis.    Physical Exam  General:  Well-developed,well-nourished,in no acute distress; alert,appropriate and cooperative throughout examination Ears:  External ear exam shows no significant lesions or deformities.  Otoscopic examination reveals clear canals, tympanic membranes are intact bilaterally without bulging, retraction, inflammation or discharge. Hearing is grossly normal bilaterally. Mouth:  Oral mucosa and oropharynx without lesions or exudates.  Teeth in good repair. Neck:  No deformities, masses, or tenderness noted. Lungs:  Normal  respiratory effort, chest expands symmetrically. Lungs are clear to auscultation, no crackles or wheezes. Heart:  normal rate and regular rhythm.   Cervical Nodes:  No lymphadenopathy noted   Impression & Recommendations:  Problem # 1:  SORE THROAT (ICD-462) rapid strep negative. Possible acute sinusitis with two-week history of facial pain The following medications were removed from the medication list:    Ciprofloxacin Hcl 500 Mg Tabs (Ciprofloxacin hcl) ..... One by mouth two times a day Her updated medication list for this problem includes:    Azithromycin 250 Mg Tabs (Azithromycin) .[name]... 2 by mouth today then one by mouth once daily for 4 days  Orders: Rapid Strep (09811)  Complete Medication List: 1)  Nexium 40 Mg Cpdr (Esomeprazole magnesium) .... Once daily 2)  Vicodin Hp 10-660 Mg Tabs (Hydrocodone-acetaminophen) .... As needed 3)  Micardis 40 Mg Tabs (Telmisartan) .... One by mouth daily 4)  Activella 0.5-0.1 Mg Tabs (Estradiol-norethindrone acet) .... One by mouth daily 5)  Multivitamins Caps (Multiple vitamin) .[name].. 1 once daily 6)  Folbee Plus Tabs (B complex-c-folic acid) .... One by mouth daily 7)  Fluconazole 150 Mg Tabs (Fluconazole) .[name].. 1 today and repeat in 5 days 8)  Azithromycin 250 Mg Tabs (Azithromycin) .... 2 by mouth today then one by mouth once daily for 4 days  Patient Instructions: 1)  Acute sinusitis symptoms for less than 10 days are not helped by antibiotics. Use warm moist compresses, and over the counter decongestants( only as directed). Call if no improvement in 5-7 days, sooner if increasing pain, fever, or new symptoms.  Prescriptions: AZITHROMYCIN 250 MG TABS (AZITHROMYCIN) 2 by mouth today then one by mouth once daily for 4 days  #6 x  0   Entered and Authorized by:   Evelena Peat MD   Signed by:   Evelena Peat MD on 03/23/2010   Method used:   [facility]        [facility]  [address] [phone]* (retail)       [address]        [place], [place]  [id number]       Ph: [phone] or [phone]       Fax: [phone]   RxID:   [id number]    Orders Added: 1)  Rapid Strep [40347] 2)  Est. Patient Level III [42595]

## 2010-03-30 NOTE — Progress Notes (Signed)
Summary: WANTS RESULTS  Phone Note Call from Patient Call back at Home Phone [phone]   Caller: Patient- VOICE MAIL Call For: DR Lennon Alstrom Reason for Call: Lab or Test Results Summary of Call: NEED TEST RESULTS FROM YESTERDAY. PLEASE ADVISE. Initial call taken by: Warnell Forester,  July 31, 2007 3:30 PM  Follow-up for Phone Call        pt informed about normal renal US and c and s of urine..was told if feet continue to swell after completing meds to call back Follow-up by: Willy Eddy, LPN,  July 30, 628 3:37 PM

## 2010-03-30 NOTE — Progress Notes (Signed)
Summary: refills  Phone Note Refill Request Message from:  Patient  Refills Requested: Medication #1:  NEXIUM 40 MG  CPDR once daily  Medication #2:  ACTIVELLA 0.5-0.1 MG TABS one by mouth daily send to [facility]  Initial call taken by: Warnell Forester,  January 16, 2010 10:39 AM    Prescriptions: NEXIUM 40 MG  CPDR (ESOMEPRAZOLE MAGNESIUM) once daily  #90 x 3   Entered by:   Willy Eddy, LPN   Authorized by:   Stacie Glaze MD   Signed by:   Willy Eddy, LPN on 30/86/5784   Method used:   Electronically to        [facility]* (retail)             ,          Ph: [phone]       Fax: [phone]   RxID:   [id number] ACTIVELLA 0.5-0.1 MG TABS (ESTRADIOL-NORETHINDRONE ACET) one by mouth daily  #90 x 3   Entered by:   Willy Eddy, LPN   Authorized by:   Stacie Glaze MD   Signed by:   Willy Eddy, LPN on 38/75/6433   Method used:   Electronically to        [facility]* (retail)             ,          Ph: [phone]       Fax: [phone]   RxID:   [id number]

## 2010-03-30 NOTE — Letter (Signed)
Summary: [facility]  [facility]   Imported By: Maryln Gottron 10/20/2009 14:39:10  _____________________________________________________________________  External Attachment:    Type:   Image     Comment:   External Document

## 2010-03-30 NOTE — Assessment & Plan Note (Signed)
Summary: sinus inf/fever/cjr   Vital Signs:  Patient profile:   60 year old female Height:      66 inches Weight:      202 pounds BMI:     32.72 Temp:     98.8 degrees F oral Pulse rate:   80 / minute Resp:     14 per minute BP sitting:   130 / 74  (right arm) Cuff size:   large  Vitals Entered By: Willy Eddy, LPN (April 05, 2009 4:12 PM) CC: c/o sinusitis like sx with cough and diarrhea   CC:  c/o sinusitis like sx with cough and diarrhea.  History of Present Illness: awoke sat AM with myalgias and fever to 102 and chills now with severe myagia and fevers have decreased advil helps diarrhea, no rash, head ache mild, cough increased went to schol nurse and had not strept  Preventive Screening-Counseling & Management  Alcohol-Tobacco     Smoking Status: never     Passive Smoke Exposure: no  Problems Prior to Update: 1)  Bronchitis, Acute With Mild Bronchospasm  (ICD-466.0) 2)  Influenza With Other Respiratory Manifestations  (ICD-487.1) 3)  Dysuria  (ICD-788.1) 4)  Symptomatic Menopausal/female Climacteric States  (ICD-627.2) 5)  Physical Examination  (ICD-V70.0) 6)  Acute Sinusitis, Unspecified  (ICD-461.9) 7)  Hip Pain  (ICD-719.45) 8)  Family History of Cad Female 1st Degree Relative <60  (ICD-V16.49) 9)  Family History of Cad Female 1st Degree Relative <50  (ICD-V17.3) 10)  Hypertension  (ICD-401.9) 11)  Osteoarthritis  (ICD-715.90) 12)  Acute Cystitis  (ICD-595.0) 13)  Urinary Retention  (ICD-788.20) 14)  Edema  (ICD-782.3) 15)  Gerd  (ICD-530.81) 16)  Low Back Pain  (ICD-724.2)  Current Problems (verified): 1)  Dysuria  (ICD-788.1) 2)  Symptomatic Menopausal/female Climacteric States  (ICD-627.2) 3)  Physical Examination  (ICD-V70.0) 4)  Acute Sinusitis, Unspecified  (ICD-461.9) 5)  Hip Pain  (ICD-719.45) 6)  Family History of Cad Female 1st Degree Relative <60  (ICD-V16.49) 7)  Family History of Cad Female 1st Degree Relative <50   (ICD-V17.3) 8)  Hypertension  (ICD-401.9) 9)  Osteoarthritis  (ICD-715.90) 10)  Acute Cystitis  (ICD-595.0) 11)  Urinary Retention  (ICD-788.20) 12)  Edema  (ICD-782.3) 13)  Gerd  (ICD-530.81) 14)  Low Back Pain  (ICD-724.2)  Medications Prior to Update: 1)  Nexium 40 Mg  Cpdr (Esomeprazole Magnesium) .... Once Daily 2)  Vicodin Hp 10-660 Mg  Tabs (Hydrocodone-Acetaminophen) .... As Needed 3)  Fish Oil Concentrate 1000 Mg  Caps (Omega-3 Fatty Acids) .... Two Caps By Mouth Bid 4)  Micardis 40 Mg Tabs (Telmisartan) .... One By Mouth Daily 5)  Nasonex 50 Mcg/act Susp (Mometasone Furoate) .... Two Spray in Each Nostril Daily 6)  Activella 0.5-0.1 Mg Tabs (Estradiol-Norethindrone Acet) .... One By Mouth Daily 7)  Smz-Tmp Ds 800-160 Mg Tabs (Sulfamethoxazole-Trimethoprim) .... Take 2 By Mouth X 1  Then 1 By Mouth Two Times A Day  For  3 Days 8)  Zithromax Z-Pak 250 Mg Tabs (Azithromycin) .... Take As Directed  Current Medications (verified): 1)  Nexium 40 Mg  Cpdr (Esomeprazole Magnesium) .... Once Daily 2)  Vicodin Hp 10-660 Mg  Tabs (Hydrocodone-Acetaminophen) .... As Needed 3)  Fish Oil Concentrate 1000 Mg  Caps (Omega-3 Fatty Acids) .... Two Caps By Mouth Bid 4)  Micardis 40 Mg Tabs (Telmisartan) .... One By Mouth Daily 5)  Nasonex 50 Mcg/act Susp (Mometasone Furoate) .... Two Spray in Each Nostril Daily 6)  Activella 0.5-0.1 Mg Tabs (Estradiol-Norethindrone Acet) .... One By Mouth Daily 7)  Azithromycin 250 Mg Tabs (Azithromycin) .... Two By Mouth Now Then On By Mouth Daily For 4 Days 8)  Atuss Ds 30-4-30 Mg/64ml Susp (Pseudoephed Hcl-Cpm-Dm Hbr Tan) .... Two Tsp By Mouth Two Times A Day  Allergies (verified): 1)  ! * Protonix  Past History:  Family History: Last updated: 03/19/2008 Family History of CAD Female 1st degree relative <50   father diesd with COPD at 24 Family History of CAD Female 1st degree relative <60   CABG 41 Bother  25 with stent with 90 %  Social History: Last  updated: 10/08/2007 Occupation: Runner, broadcasting/film/video Married  Risk Factors: Smoking Status: never (04/05/2009) Passive Smoke Exposure: no (04/05/2009)  Past medical, surgical, family and social histories (including risk factors) reviewed, and no changes noted (except as noted below).  Past Medical History: Reviewed history from 10/08/2007 and no changes required. Low back pain GERD Osteoarthritis Hypertension  Past Surgical History: Reviewed history from 06/16/2008 and no changes required. Lumbar fusion Cholecystectomy Tonsillectomy right hip replacement  Family History: Reviewed history from 03/19/2008 and no changes required. Family History of CAD Female 1st degree relative <50   father diesd with COPD at 52 Family History of CAD Female 1st degree relative <60   CABG 22 Bother  52 with stent with 90 %  Social History: Reviewed history from 10/08/2007 and no changes required. Occupation: Runner, broadcasting/film/video Married  Review of Systems       The patient complains of decreased hearing, hoarseness, prolonged cough, and headaches.  The patient denies anorexia, fever, weight loss, weight gain, vision loss, chest pain, syncope, dyspnea on exertion, peripheral edema, hemoptysis, abdominal pain, melena, hematochezia, severe indigestion/heartburn, hematuria, incontinence, genital sores, muscle weakness, suspicious skin lesions, transient blindness, difficulty walking, depression, unusual weight change, abnormal bleeding, enlarged lymph nodes, angioedema, and breast masses.    Physical Exam  General:  alert and well-developed.   Head:  normocephalic and atraumatic.   Eyes:  vision grossly intact and pupils equal.   Ears:  R ear normal and L ear normal.   Nose:  mucosal erythema and mucosal edema.   Mouth:  posterior lymphoid hypertrophy and postnasal drip.   Neck:  No deformities, masses, or tenderness noted. Lungs:  normal respiratory effort, no dullness, and no fremitus.   Heart:  normal rate and  regular rhythm.   Abdomen:  mild suprapubic tenderness  no g or rebound  and no flank pain.  BS pos  no hepatomegaly and no splenomegaly.     Impression & Recommendations:  Problem # 1:  INFLUENZA WITH OTHER RESPIRATORY MANIFESTATIONS (ICD-487.1) flu like ilness with exposure at work to influenza Rest, increase fluids, use Tylenol [phone] mg every 4-6 hours, and avoid contact with others. call office if no improvement in 5-7 days or if symptoms worsen.   Problem # 2:  BRONCHITIS, ACUTE WITH MILD BRONCHOSPASM (ICD-466.0) since she had the flu shot more likely bacterial bronchitis The following medications were removed from the medication list:    Smz-tmp Ds 800-160 Mg Tabs (Sulfamethoxazole-trimethoprim) .[name]... Take 2 by mouth x 1  then 1 by mouth two times a day  for  3 days    Zithromax Z-pak 250 Mg Tabs (Azithromycin) .[name]... Take as directed Her updated medication list for this problem includes:    Azithromycin 250 Mg Tabs (Azithromycin) .[name]..[name] Two by mouth now then on by mouth daily for 4 days    Atuss Ds 30-4-30 Mg/60ml  Susp (Pseudoephed hcl-cpm-dm hbr tan) .[name]..[name] Two tsp by mouth two times a day  Complete Medication List: 1)  Nexium 40 Mg Cpdr (Esomeprazole magnesium) .... Once daily 2)  Vicodin Hp 10-660 Mg Tabs (Hydrocodone-acetaminophen) .... As needed 3)  Fish Oil Concentrate 1000 Mg Caps (Omega-3 fatty acids) .... Two caps by mouth bid 4)  Micardis 40 Mg Tabs (Telmisartan) .... One by mouth daily 5)  Nasonex 50 Mcg/act Susp (Mometasone furoate) .... Two spray in each nostril daily 6)  Activella 0.5-0.1 Mg Tabs (Estradiol-norethindrone acet) .... One by mouth daily 7)  Azithromycin 250 Mg Tabs (Azithromycin) .... Two by mouth now then on by mouth daily for 4 days 8)  Atuss Ds 30-4-30 Mg/46ml Susp (Pseudoephed hcl-cpm-dm hbr tan) .... Two tsp by mouth two times a day  Patient Instructions: 1)  Take your antibiotic as prescribed until ALL of it is gone, but stop if you develop a rash  or swelling and contact our office as soon as possible. Prescriptions: ATUSS DS 30-4-30 MG/5ML SUSP (PSEUDOEPHED HCL-CPM-DM HBR TAN) two tsp by mouth two times a day  #6 oz x 1   Entered and Authorized by:   Stacie Glaze MD   Signed by:   Rudy Jew, RN on 04/06/2009   Method used:   [facility]  [address] [phone][facility] (retail)       [address]       [place], [place]  [address]       Ph: [phone] or [phone]       Fax: [phone]   RxID:   [id number] AZITHROMYCIN 250 MG TABS (AZITHROMYCIN) two by mouth now then on by mouth daily for 4 days  #6 x 0   Entered and Authorized by:   Stacie Glaze MD   Signed by:   Rudy Jew, RN on 04/06/2009   Method used:   [facility]        [facility]  [address] [phone]* (retail)       [address]       [place], [place]  [address]       Ph: [phone] or [phone]       Fax: [phone]   RxID:   [id number]

## 2010-03-30 NOTE — Consult Note (Signed)
Summary: [facility]  [facility]   Imported By: Esmeralda Links D'jimraou 08/07/2007 15:15:00  _____________________________________________________________________  External Attachment:    Type:   Image     Comment:   External Document

## 2010-04-03 ENCOUNTER — Ambulatory Visit (INDEPENDENT_AMBULATORY_CARE_PROVIDER_SITE_OTHER): Payer: Self-pay | Admitting: Internal Medicine

## 2010-04-03 ENCOUNTER — Encounter: Payer: Self-pay | Admitting: *Deleted

## 2010-04-03 ENCOUNTER — Encounter: Payer: Self-pay | Admitting: Internal Medicine

## 2010-04-03 VITALS — BP 144/80 | HR 80 | Temp 98.7°F | Resp 16 | Ht 63.0 in | Wt 202.0 lb

## 2010-04-03 DIAGNOSIS — J060 Acute laryngopharyngitis: Secondary | ICD-10-CM

## 2010-04-03 DIAGNOSIS — R05 Cough: Secondary | ICD-10-CM

## 2010-04-03 DIAGNOSIS — R059 Cough, unspecified: Secondary | ICD-10-CM

## 2010-04-03 DIAGNOSIS — J069 Acute upper respiratory infection, unspecified: Secondary | ICD-10-CM

## 2010-04-03 DIAGNOSIS — J019 Acute sinusitis, unspecified: Secondary | ICD-10-CM

## 2010-04-03 MED ORDER — LEVOFLOXACIN 500 MG PO TABS: 500.0000 mg | ORAL_TABLET | Freq: Every day | ORAL | Status: AC

## 2010-04-03 MED ORDER — BROMPHENIRAMINE-PSEUDOEPH 4-60 MG PO TABS: 1.0000 | ORAL_TABLET | Freq: Four times a day (QID) | ORAL | Status: AC | PRN

## 2010-04-03 MED ORDER — HYDROCOD POLST-CHLORPHEN POLST 10-8 MG/5ML PO LQCR: 5.0000 mL | Freq: Two times a day (BID) | ORAL | Status: DC | PRN

## 2010-04-03 NOTE — Progress Notes (Signed)
  Subjective:    Patient ID: Brandy Dunlap, female    DOB: [birth year], 60 y.o.   MRN: [id number]  HPI Patient is a 60 year old white female who presents for followup visit for an upper respiratory tract infection which has not resolved after a visit with her previous provider who gave her a prescription for a Z-Pak and a cough medicine.   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    Review of Systems  Constitutional: Negative for activity change, appetite change and fatigue.  HENT: Positive for congestion, sore throat, rhinorrhea, sneezing, voice change, postnasal drip and sinus pressure. Negative for ear pain and neck pain.   Eyes: Negative for redness and visual disturbance.  Respiratory: Positive for cough and chest tightness. Negative for shortness of breath and wheezing.   Gastrointestinal: Negative for abdominal pain and abdominal distention.  Genitourinary: Negative for dysuria, frequency and menstrual problem.  Musculoskeletal: Negative for myalgias, joint swelling and arthralgias.  Skin: Negative for rash and wound.  Neurological: Negative for dizziness, weakness and headaches.  Hematological: Negative for adenopathy. Does not bruise/bleed easily.  Psychiatric/Behavioral: Negative for sleep disturbance and decreased concentration.       I have reviewed with patient her past medical history social history surgical history problem list medication list and allergy  list Objective:   Physical Exam  on physical examination she is an obese white female in no apparent distress his blood pressure was 144/80 pulse was 80 temperature was 98.7 respiratory rate was 16 neck was supple with tender adenopathy in the posterior cervical chain pupils are equal round reactive to light and accommodation turbinates were swollen and red with a mucopurulent discharge from the sinuses oropharynx showed posterior erythema with cobblestoning lung fields were clear bilaterally to auscultation and percussion heart examination revealed a regular rate and rhythm abdomen was soft extremity examination revealed no cyanosis clubbing or edema neurological examination was nonfocal she is alert and oriented       Assessment & Plan:  1. Prolonged upper respiratory tract infection the patient is a prolonged upper tract respiratory tract infection which may indeed be chronic sinusitis on physical examination her symptoms and presentation best fit this diagnosis although early pneumonia is also a diagnosis that we must entertain we will prescribe Levaquin 500 mg daily for 14 days Bromfed one by mouth 4 hours as needed and Tussionex for cough. She is instructed if her symptoms persist or worsen we would obtain a chest x-ray and consideration for a limited sinus CT she will monitor her symptoms carefully and call us within 48 hours of initiating the antibiotic therapy.   Should she develop a yeast infection we have told her that Diflucan is contraindicated with the fluoroquinolone she is taking so she will need to use a Monistat suppository.   I spent 30 minutes with this patient face-to-face of which over half was counseling

## 2010-04-03 NOTE — Patient Instructions (Signed)
Complete all 14 days of antibiotics she developed a yeast infection I recommend Monistat vaginal suppositories one now and repeat in 7 days

## 2010-04-21 ENCOUNTER — Ambulatory Visit (INDEPENDENT_AMBULATORY_CARE_PROVIDER_SITE_OTHER): Payer: BC Managed Care – PPO | Admitting: Internal Medicine

## 2010-04-21 ENCOUNTER — Encounter: Payer: Self-pay | Admitting: Internal Medicine

## 2010-04-21 VITALS — BP 128/80 | HR 76 | Temp 98.7°F | Resp 16 | Ht 62.0 in | Wt 202.0 lb

## 2010-04-21 DIAGNOSIS — J019 Acute sinusitis, unspecified: Secondary | ICD-10-CM

## 2010-04-21 MED ORDER — DOXYCYCLINE HYCLATE 100 MG PO TABS: 100.0000 mg | ORAL_TABLET | Freq: Two times a day (BID) | ORAL | Status: AC

## 2010-04-21 MED ORDER — FLUCONAZOLE 100 MG PO TABS: 100.0000 mg | ORAL_TABLET | Freq: Every day | ORAL | Status: AC

## 2010-04-21 MED ORDER — PHENYLEPHRINE-APAP-GUAIFENESIN 10-650-400 MG/20ML PO LIQD: 5.0000 mL | Freq: Four times a day (QID) | ORAL | Status: DC | PRN

## 2010-04-21 MED ORDER — METHYLPREDNISOLONE (PAK) 4 MG PO TABS: 4.0000 mg | ORAL_TABLET | Freq: Every day | ORAL | Status: DC

## 2010-04-21 NOTE — Progress Notes (Signed)
Subjective:    Patient ID: Brandy Dunlap, female    DOB: 12/28/1950, 60 y.o.   MRN: [id number]  HPI   patient is a 60 year old white female who presents for followup of an upper respiratory tract infection proximally 2 weeks ago she was seen in the office and treated for acute sinusitis she presents today much improvement with a persistent hacking cough and persistent postnasal drip.   she denies any fever chills nausea vomiting she states her cough is nonproductive she states that she does feel fatigued but no shortness of breath. Review of her previous note and any previous antibiotic therapy she is currently not taking any medication at this time  Review of Systems  Constitutional: Negative for activity change, appetite change and fatigue.  HENT: Positive for rhinorrhea, sneezing, postnasal drip and sinus pressure. Negative for ear pain, congestion and neck pain.   Eyes: Negative for redness and visual disturbance.  Respiratory: Negative for cough, shortness of breath and wheezing.   Gastrointestinal: Negative for abdominal pain and abdominal distention.  Genitourinary: Negative for dysuria, frequency and menstrual problem.  Musculoskeletal: Negative for myalgias, joint swelling and arthralgias.  Skin: Negative for rash and wound.  Neurological: Negative for dizziness, weakness and headaches.  Hematological: Negative for adenopathy. Does not bruise/bleed easily.  Psychiatric/Behavioral: Negative for sleep disturbance and decreased concentration.   Past Medical History  Diagnosis Date  . Low back pain   . GERD (gastroesophageal reflux disease)   . Hypertension   . Osteoarthritis    Past Surgical History  Procedure Date  . Spine surgery     lumbar fusion  . Cholecystectomy   . Tonsillectomy   . Partial hip arthroplasty     right hip replacement    reports that she has never smoked. She does not have any smokeless tobacco history on file. She reports that she drinks alcohol.  She reports that she does not use illicit drugs. family history includes COPD in her father and mother and Heart disease in her brother and mother.         Objective:   Physical Exam  Constitutional: She is oriented to person, place, and time. She appears well-developed and well-nourished. No distress.  HENT:  Head: Normocephalic and atraumatic.  Right Ear: External ear normal.  Left Ear: External ear normal.        Her examination of her turbinates revealed them to be swollen glistening purple in color oropharynx shows crusting discharge without cobblestoning neck is supple  Eyes: Conjunctivae and EOM are normal. Pupils are equal, round, and reactive to light.  Neck: Normal range of motion. Neck supple. No JVD present. No tracheal deviation present. No thyromegaly present.  Cardiovascular: Normal rate, regular rhythm, normal heart sounds and intact distal pulses.   No murmur heard. Pulmonary/Chest: Effort normal and breath sounds normal. She has no wheezes. She exhibits no tenderness.  Abdominal: Soft. Bowel sounds are normal.  Musculoskeletal: Normal range of motion. She exhibits no edema and no tenderness.  Lymphadenopathy:    She has no cervical adenopathy.  Neurological: She is alert and oriented to person, place, and time. She has normal reflexes. No cranial nerve deficit.  Skin: Skin is warm and dry. She is not diaphoretic.  Psychiatric: She has a normal mood and affect. Her behavior is normal.          Assessment & Plan:   the differential diagnosis for this visit includes a post infectious cough versus a persistent infection.  Because of  her history of chronic sinusitis it is prudent to air on the side of a chronic infection and we'll put her on doxycycline for 21 days to eliminate any staph or strep from her sinuses she also is given a prescription for Mucinex liquid cold and sinus to take 4 times a day as needed for cough and congestion as well as a Medrol Dosepak which  might be more applicable to the allergic component in the swelling of her turbinates which is present on examination.   should her symptoms continue to persist the next step would be a CT scan and referral to an ENT physician she is aware of our differential diagnosis

## 2010-04-21 NOTE — Assessment & Plan Note (Signed)
The patient was treated earlier this month for acute bronchitis she presents with persistent symptoms of a hacking cough nasal congestion and her physical examination is consistent with probable acute on chronic sinusitis Will treat with a prolonged antibiotic course decongestants and a Medrol Dosepak

## 2010-06-22 ENCOUNTER — Telehealth: Payer: Self-pay | Admitting: Internal Medicine

## 2010-06-22 NOTE — Telephone Encounter (Signed)
Pt called and has a ?shin splint or pulled muscle at the site of hip replacement. Pt having pain in muscle in back of leg. Pt is req a work in appt with Dr Lovell Sheehan either today or tomorrow. Pt refuses to see another doctor.

## 2010-06-22 NOTE — Telephone Encounter (Signed)
Per dr Lovell Sheehan- to ortho that did surgery- pt agrees and will call  For possible pa to see her today

## 2010-07-11 NOTE — Discharge Summary (Signed)
[name], Brandy Dunlap               ACCOUNT NO.:  [id number]   MEDICAL RECORD NO.:  [id number]          PATIENT TYPE:  INP   LOCATION:  3011                         FACILITY:  [facility]   PHYSICIAN:  Coletta Memos, M.D.     DATE OF BIRTH:  Jan 19, 1951   DATE OF ADMISSION:  06/18/2007  DATE OF DISCHARGE:  06/21/2007                               DISCHARGE SUMMARY   ADMITTING DIAGNOSES:  1. L4-5 degenerative disk disease.  2. L4-5 spondylosis.  3. Low back pain.   DISCHARGE DIAGNOSES:  1. L4-5 degenerative disk disease.  2. L4-5 spondylosis.  3. Low back pain.   PROCEDURE:  Decompression L4-5 posterior lumbar interbody fusion,  pedicle screw fixation.   COMPLICATIONS:  None.   DISCHARGE STATUS:  Alive and well.  Wound clean, dry, no signs of  infection.  Brandy Dunlap was tolerating a regular diet, walking and  voiding without difficulty.  She will be sent home.   MEDICATIONS FOR DISCHARGE:  Percocet and Valium.   She will be seen by Dr. Lovell Sheehan in his office for postoperative care.  She has done quite well in the hospital and at discharge continues to do  well.           ______________________________  Coletta Memos, M.D.     KC/MEDQ  D:  06/21/2007  T:  06/21/2007  Job:  [id number]

## 2010-07-11 NOTE — Op Note (Signed)
Brandy Dunlap, Brandy Dunlap NO.:  [id number]   MEDICAL RECORD NO.:  [id number]          PATIENT TYPE:  INP   LOCATION:  3011                         FACILITY:  [facility]   PHYSICIAN:  Cristi Loron, M.D.DATE OF BIRTH:  [birth year]   DATE OF PROCEDURE:  06/18/2007  DATE OF DISCHARGE:                               OPERATIVE REPORT   BRIEF HISTORY:  The patient is a 60 year old white female who has  suffered from back and leg pain.  She has failed medical management and  was worked up with lumbar x-rays and MRI, which demonstrated the patient  had disk degeneration and multifactorial spinal stenosis at L4-5.  The  patient's symptoms were consistent with neurogenic claudication.  I  discussed the various treatment options with the patient including  surgery.  The patient was explained the risks, benefits, and alternative  of surgery so I will proceed with a L4-5 decompression and  instrumentation and fusion.   PREOPERATIVE DIAGNOSIS:  L4-5 degenerative disk disease, spinal  stenosis, facet arthropathy, lumbar radiculopathy and myelopathy.   POSTOPERATIVE DIAGNOSIS:  L4-5 degenerative  disk disease, spinal  stenosis,  facet arthropathy, lumbar radiculopathy and myelopathy.   PROCEDURE:  Bilateral L4 laminotomies and foraminotomies to decompress  the bilateral L4 and L5 nerve roots; L4-5 transforaminal lumbar  interbody fusion with local autograft bone and Vitoss bone graft  extender; insertion of L4-5 interbody prosthesis (Capstone PEEK  interbody prosthesis); L4-5 posterior nonsegmental instrumentation with  Legacy titanium pedicle screws and rods; L4-5 posterolateral arthrodesis  with local morselized autograft bone and Vitoss bone graft extender.   SURGEON:  Dr. Delma Officer.   ASSISTANT:  Dr. Colon Branch.   ANESTHESIA:  General endotracheal.   ESTIMATED BLOOD LOSS:  250 mL.   SPECIMEN:  None.   DRAINS:  None.   COMPLICATIONS:  None.   DESCRIPTION OF  PROCEDURE:  The patient was brought to the operating room  by anesthesia team.  General endotracheal anesthesia was induced.  The  patient was then turned to the prone position on the Wilson frame.  The  lumbosacral region was then prepared with Betadine scrub and Betadine  solution.  Sterile drapes were applied.  I then injected the area to be  incised with Marcaine with epinephrine solution. I used a scalpel made a  linear midline incision over the L4-5 interspace.  I used electrocautery  to perform a bilateral subperiosteal dissection exposing the spinous  process lamina of L3-4 and L5.  We obtained intraoperative radiograph to  confirm our location.  We then inserted the [facility] retractor for  exposure.  We began the decompression by using a high-speed drill to  perform bilateral foraminotomies.  We widened these laminotomies with  Kerrison punch.  Of note, because of the severe multifactorial spinal  stenosis at this level, the extent of decompression was much greater  than I made it for the interbody fusion; i.e., we performed wide  foraminotomies and removal of the medial aspect of face joints, etc.  We  performed foraminotomies about the bilateral L4 and L5 nerve roots.  We  removed the  cephalad aspect of the L5 lamina and we got good  decompression of the thecal sac and bilateral L4 and L5 nerve roots.   Having completed the decompression was now turned attention to the  arthrodesis.  I removed the anterior facet of L4 on the right and this  gave wide lateral exposure to right L4-5 intervertebral disk.  I incised  the disk with a 15 blade scalpel and performed the partial  intervertebral discectomy with the pituitary forceps and curettes.  We  prepared vertebral endplates for the fusion by removing soft tissue  using the curettes.  We then used a trial spacer and determined to use a  10 x 26 mm interbody prosthesis.  We prefilled this PEEK Capstone  prosthesis with a  combination of local autograft bone, we obtained  during the decompression as well as Vitoss bone graft extender.  We also  filled the ventral disk space with Vitoss and autograft bone.  We then  placed the prosthesis in the interspace from the right, of course, after  retracting the neural structures out of harms way.  We then turned the  prosthesis laterally with the bone tamps.  We then filled posterior  prosthesis with local autograft bone and Vitoss completing the  transforaminal lumbar interbody fusion.   We then turned attention to the instrumentation under fluoroscopic  guidance.  We cannulated the bilateral L4 and L5 pedicles with pedicle  probes.  We tapped the pedicles with a 5.5 mm tap.  We then inserted 6.5  x 45 mm pedicle screws bilaterally at L4-L5 under fluoroscopic guidance.  We then palpated along the medial aspect of the L4 and L5 pedicles and  noted there was no cortical breeches and that the L4-5 nerve roots were  not injured.  We then connected unilateral pedicle screws with lordotic  rod.  We compressed the construct, secured the rod in place with the  capsule. We tightened appropriately, which completed the  instrumentation.   We now turned attention to the posterolateral arthrodesis.  This had to  be drilled to decorticate the remainder of the left L4-5 facet joint as  well as the bilateral transverse process and pars region.  We then laid  a combination of local autograft bone and Vitoss bone graft extender  over these decorticated posterior structures completing the  posterolateral arthrodesis.   We then obtained hemostasis using bipolar electrocautery.  We irrigated  the wound with bacitracin solution.  We then inspected the thecal sac  about L4 and L5 nerve roots and noted they were well decompressed.  We  then removed the retractor and reapproximated the patient's  thoracolumbar fascia with interrupted # 1 Vicryl suture.  The  subcutaneous tissue then  interrupted with 2-0 Vicryl suture and skin  with Steri-Strips and Benzoin.  The wound was then coated bacitracin  ointment and sterile dressing was applied.  The drapes were removed and  the patient was subsequently returned to supine position where she was  extubated by anesthesia team and transported to the Post Anesthesia Care  Unit in stable condition.  All sponge, instrument, and needle counts  were correct at the end of this case.      Cristi Loron, M.D.  Electronically Signed     JDJ/MEDQ  D:  06/18/2007  T:  06/19/2007  Job:  [id number]

## 2010-08-08 ENCOUNTER — Ambulatory Visit (INDEPENDENT_AMBULATORY_CARE_PROVIDER_SITE_OTHER): Payer: BC Managed Care – PPO | Admitting: Internal Medicine

## 2010-08-08 ENCOUNTER — Encounter: Payer: Self-pay | Admitting: Internal Medicine

## 2010-08-08 VITALS — BP 118/70 | Temp 97.8°F

## 2010-08-08 DIAGNOSIS — N390 Urinary tract infection, site not specified: Secondary | ICD-10-CM

## 2010-08-08 DIAGNOSIS — N3000 Acute cystitis without hematuria: Secondary | ICD-10-CM

## 2010-08-08 LAB — POCT URINALYSIS DIPSTICK
Bilirubin, UA: NEGATIVE
Blood, UA: NEGATIVE
Glucose, UA: NEGATIVE
Ketones, UA: NEGATIVE
Nitrite, UA: NEGATIVE
Protein, UA: NEGATIVE
Spec Grav, UA: 1.03
Urobilinogen, UA: 0.2
pH, UA: 5.5

## 2010-08-08 MED ORDER — CIPROFLOXACIN HCL 500 MG PO TABS: 500.0000 mg | ORAL_TABLET | Freq: Two times a day (BID) | ORAL | Status: AC

## 2010-08-08 NOTE — Progress Notes (Signed)
  Subjective:    Patient ID: Brandy Dunlap, female    DOB: 10-27-1950, 60 y.o.   MRN: [id number]  HPI 60 year old patient who presents with a couple day history of urinary frequency and some dysuria. She has had some lower abdominal discomfort. She has had urinary tract infections in the past. UA revealed mild pyuria;  no fever chills or flank pain   Review of Systems  Genitourinary: Positive for dysuria, frequency and difficulty urinating.       Objective:   Physical Exam  Constitutional: She appears well-developed and well-nourished. No distress.       Blood pressure 118/70. Afebrile          Assessment & Plan:   UTI. Will treat with Cipro 500 twice a day

## 2010-08-08 NOTE — Patient Instructions (Signed)
Take your antibiotic as prescribed until ALL of it is gone, but stop if you develop a rash, swelling, or any side effects of the medication.  Contact our office as soon as possible if  there are side effects of the medication.  Call or return to clinic prn if these symptoms worsen or fail to improve as anticipated.  

## 2010-08-23 ENCOUNTER — Other Ambulatory Visit: Payer: Self-pay

## 2010-08-23 ENCOUNTER — Other Ambulatory Visit: Payer: Self-pay | Admitting: Internal Medicine

## 2010-08-23 DIAGNOSIS — Z1231 Encounter for screening mammogram for malignant neoplasm of breast: Secondary | ICD-10-CM

## 2010-08-24 ENCOUNTER — Encounter: Payer: Self-pay | Admitting: Family Medicine

## 2010-08-24 ENCOUNTER — Ambulatory Visit (INDEPENDENT_AMBULATORY_CARE_PROVIDER_SITE_OTHER): Payer: BC Managed Care – PPO | Admitting: Family Medicine

## 2010-08-24 VITALS — BP 130/80 | Temp 98.7°F

## 2010-08-24 DIAGNOSIS — M545 Low back pain, unspecified: Secondary | ICD-10-CM

## 2010-08-24 MED ORDER — KETOROLAC TROMETHAMINE 60 MG/2ML IM SOLN
60.0000 mg | Freq: Once | INTRAMUSCULAR | Status: AC
  Administered 2010-08-24: 60 mg via INTRAMUSCULAR

## 2010-08-24 MED ORDER — CYCLOBENZAPRINE HCL 10 MG PO TABS: 10.0000 mg | ORAL_TABLET | Freq: Three times a day (TID) | ORAL | Status: AC | PRN

## 2010-08-24 NOTE — Progress Notes (Signed)
  Subjective:    Patient ID: Brandy Dunlap, female    DOB: January 15, 1951, 60 y.o.   MRN: [id number]  HPI Patient seen with right low back pain. Noted after twisting in shower Tuesday. She has a sensation of tightness. No radiculopathy symptoms. Back surgery 3 years ago this pain is different. She's tried heat and ice and topical rubs without improvement. Took some Aleve this morning without much improvement. No urine incontinence.   Review of Systems  Gastrointestinal: Negative for abdominal pain.  Genitourinary: Negative for dysuria and urgency.  Musculoskeletal: Positive for back pain.       Objective:   Physical Exam  Constitutional: She appears well-developed and well-nourished.  Cardiovascular: Normal rate and regular rhythm.   Pulmonary/Chest: Effort normal and breath sounds normal. No respiratory distress. She has no wheezes. She has no rales.  Musculoskeletal:       Tender right lower lumbar region muscular. No spinal tenderness. Right straight leg raise negative.  Neurological:       Full-strength with plantar and dorsi flexion on the right side. 2+ knee and 1+ ankle reflex right and left. No sensory impairment.          Assessment & Plan:  Low back pain. Suspect muscular. Continue topical treatment. Flexeril 10 mg every 8 hours when necessary with caution about sedation.  Toradol 60 mg IM

## 2010-08-24 NOTE — Patient Instructions (Signed)
Follow up with Dr Lovell Sheehan if back pain no better in 2-3 weeks. Continue with heat and ice and topical rubs.

## 2010-08-31 ENCOUNTER — Ambulatory Visit
Admission: RE | Admit: 2010-08-31 | Discharge: 2010-08-31 | Disposition: A | Discharge status: Home / Self Care | Payer: BC Managed Care – PPO | Source: Ambulatory Visit | Attending: Internal Medicine | Admitting: Internal Medicine

## 2010-08-31 DIAGNOSIS — Z1231 Encounter for screening mammogram for malignant neoplasm of breast: Secondary | ICD-10-CM

## 2010-09-01 ENCOUNTER — Ambulatory Visit (INDEPENDENT_AMBULATORY_CARE_PROVIDER_SITE_OTHER): Payer: BC Managed Care – PPO

## 2010-09-01 ENCOUNTER — Inpatient Hospital Stay (INDEPENDENT_AMBULATORY_CARE_PROVIDER_SITE_OTHER)
Admission: RE | Admit: 2010-09-01 | Discharge: 2010-09-01 | Disposition: A | Discharge status: Home / Self Care | Payer: BC Managed Care – PPO | Source: Ambulatory Visit | Attending: Family Medicine | Admitting: Family Medicine

## 2010-09-01 DIAGNOSIS — M25559 Pain in unspecified hip: Secondary | ICD-10-CM

## 2010-09-01 LAB — POCT URINALYSIS DIP (DEVICE)
Bilirubin Urine: NEGATIVE
Glucose, UA: NEGATIVE mg/dL
Ketones, ur: NEGATIVE mg/dL
Leukocytes, UA: NEGATIVE
Nitrite: NEGATIVE
Protein, ur: NEGATIVE mg/dL
Specific Gravity, Urine: 1.01 (ref 1.005–1.030)
Urobilinogen, UA: 0.2 mg/dL (ref 0.0–1.0)
pH: 5.5 (ref 5.0–8.0)

## 2010-09-11 ENCOUNTER — Other Ambulatory Visit (INDEPENDENT_AMBULATORY_CARE_PROVIDER_SITE_OTHER): Payer: BC Managed Care – PPO

## 2010-09-11 DIAGNOSIS — Z0000 Encounter for general adult medical examination without abnormal findings: Secondary | ICD-10-CM

## 2010-09-11 LAB — LIPID PANEL
Cholesterol: 210 mg/dL — ABNORMAL HIGH (ref 0–200)
HDL: 63.2 mg/dL (ref >39.00)
Total CHOL/HDL Ratio: 3
Triglycerides: 114 mg/dL (ref 0.0–149.0)
VLDL: 22.8 mg/dL (ref 0.0–40.0)

## 2010-09-11 LAB — POCT URINALYSIS DIPSTICK
Bilirubin, UA: NEGATIVE
Glucose, UA: NEGATIVE
Ketones, UA: NEGATIVE
Nitrite, UA: NEGATIVE
Protein, UA: NEGATIVE
Spec Grav, UA: 1.005
Urobilinogen, UA: 0.2
pH, UA: 5.5

## 2010-09-11 LAB — BASIC METABOLIC PANEL
BUN: 7 mg/dL (ref 6–23)
CO2: 25 mEq/L (ref 19–32)
Calcium: 8.9 mg/dL (ref 8.4–10.5)
Chloride: 100 mEq/L (ref 96–112)
Creatinine, Ser: 0.7 mg/dL (ref 0.4–1.2)
GFR: 92.28 mL/min (ref >60.00)
Glucose, Bld: 105 mg/dL — ABNORMAL HIGH (ref 70–99)
Potassium: 4.5 mEq/L (ref 3.5–5.1)
Sodium: 135 mEq/L (ref 135–145)

## 2010-09-11 LAB — HEPATIC FUNCTION PANEL
ALT: 23 U/L (ref 0–35)
AST: 29 U/L (ref 0–37)
Albumin: 4.2 g/dL (ref 3.5–5.2)
Alkaline Phosphatase: 48 U/L (ref 39–117)
Bilirubin, Direct: 0 mg/dL (ref 0.0–0.3)
Total Bilirubin: 0.5 mg/dL (ref 0.3–1.2)
Total Protein: 7.3 g/dL (ref 6.0–8.3)

## 2010-09-11 LAB — CBC WITH DIFFERENTIAL/PLATELET
Basophils Absolute: 0 10*3/uL (ref 0.0–0.1)
Basophils Relative: 0.4 % (ref 0.0–3.0)
Eosinophils Absolute: 0.1 10*3/uL (ref 0.0–0.7)
Eosinophils Relative: 2 % (ref 0.0–5.0)
HCT: 36.9 % (ref 36.0–46.0)
Hemoglobin: 12.1 g/dL (ref 12.0–15.0)
Lymphocytes Relative: 36.6 % (ref 12.0–46.0)
Lymphs Abs: 1.9 10*3/uL (ref 0.7–4.0)
MCHC: 32.8 g/dL (ref 30.0–36.0)
MCV: 86.8 fl (ref 78.0–100.0)
Monocytes Absolute: 0.4 10*3/uL (ref 0.1–1.0)
Monocytes Relative: 6.8 % (ref 3.0–12.0)
Neutro Abs: 2.8 10*3/uL (ref 1.4–7.7)
Neutrophils Relative %: 54.2 % (ref 43.0–77.0)
Platelets: 153 10*3/uL (ref 150.0–400.0)
RBC: 4.25 Mil/uL (ref 3.87–5.11)
RDW: 16.4 % — ABNORMAL HIGH (ref 11.5–14.6)
WBC: 5.3 10*3/uL (ref 4.5–10.5)

## 2010-09-11 LAB — TSH: TSH: 1.16 u[IU]/mL (ref 0.35–5.50)

## 2010-09-11 LAB — LDL CHOLESTEROL, DIRECT: Direct LDL: 149.9 mg/dL

## 2010-09-18 ENCOUNTER — Other Ambulatory Visit: Payer: Self-pay

## 2010-09-25 ENCOUNTER — Ambulatory Visit (INDEPENDENT_AMBULATORY_CARE_PROVIDER_SITE_OTHER): Payer: BC Managed Care – PPO | Admitting: Internal Medicine

## 2010-09-25 ENCOUNTER — Encounter: Payer: Self-pay | Admitting: Internal Medicine

## 2010-09-25 VITALS — BP 134/80 | HR 76 | Temp 98.2°F | Resp 16 | Ht 63.0 in | Wt 200.0 lb

## 2010-09-25 DIAGNOSIS — K219 Gastro-esophageal reflux disease without esophagitis: Secondary | ICD-10-CM

## 2010-09-25 DIAGNOSIS — N951 Menopausal and female climacteric states: Secondary | ICD-10-CM

## 2010-09-25 DIAGNOSIS — M792 Neuralgia and neuritis, unspecified: Secondary | ICD-10-CM

## 2010-09-25 DIAGNOSIS — M25569 Pain in unspecified knee: Secondary | ICD-10-CM

## 2010-09-25 DIAGNOSIS — M130 Polyarthritis, unspecified: Secondary | ICD-10-CM

## 2010-09-25 DIAGNOSIS — M1710 Unilateral primary osteoarthritis, unspecified knee: Secondary | ICD-10-CM

## 2010-09-25 DIAGNOSIS — Z0000 Encounter for general adult medical examination without abnormal findings: Secondary | ICD-10-CM

## 2010-09-25 DIAGNOSIS — I10 Essential (primary) hypertension: Secondary | ICD-10-CM

## 2010-09-25 MED ORDER — FOLIC ACID-VIT B6-VIT B12 2.5-25-1 MG PO TABS: 1.0000 | ORAL_TABLET | Freq: Every day | ORAL | Status: DC

## 2010-09-25 MED ORDER — METHYLPREDNISOLONE ACETATE 40 MG/ML IJ SUSP: 40.0000 mg | Freq: Once | INTRAMUSCULAR | Status: DC

## 2010-09-25 MED ORDER — ESOMEPRAZOLE MAGNESIUM 40 MG PO CPDR: 40.0000 mg | DELAYED_RELEASE_CAPSULE | Freq: Every day | ORAL | Status: DC

## 2010-09-25 MED ORDER — TELMISARTAN 40 MG PO TABS: 40.0000 mg | ORAL_TABLET | Freq: Every day | ORAL | Status: DC

## 2010-09-25 MED ORDER — ESTRADIOL-NORETHINDRONE ACET 0.5-0.1 MG PO TABS: 1.0000 | ORAL_TABLET | Freq: Every day | ORAL | Status: DC

## 2010-09-25 NOTE — Progress Notes (Signed)
Subjective:    Patient ID: Brandy Dunlap, female    DOB: 1950/06/03, 60 y.o.   MRN: [id number]  HPI patient presents for complete physical examination.  She has additional complaints of multiple joint arthritis including significant pain in her left knee.  She states that she saw her neurosurgeon for back pain was placed on a prednisone Dosepak which alleviated the symptoms but now she has swelling in her left knee and is tender and having difficulty with ambulation.  She also has a history of hypertension is stable at the current time she has a history of allergic rhinitis which is stable she has a history of L5 radicular pain    Review of Systems  Constitutional: Negative for activity change, appetite change and fatigue.  HENT: Negative for ear pain, congestion, neck pain, postnasal drip and sinus pressure.   Eyes: Negative for redness and visual disturbance.  Respiratory: Negative for cough, shortness of breath and wheezing.   Gastrointestinal: Negative for abdominal pain and abdominal distention.  Genitourinary: Negative for dysuria, frequency and menstrual problem.  Musculoskeletal: Negative for myalgias, joint swelling and arthralgias.  Skin: Negative for rash and wound.  Neurological: Negative for dizziness, weakness and headaches.  Hematological: Negative for adenopathy. Does not bruise/bleed easily.  Psychiatric/Behavioral: Negative for sleep disturbance and decreased concentration.   Past Medical History  Diagnosis Date  . Low back pain   . GERD (gastroesophageal reflux disease)   . Hypertension   . Osteoarthritis    Past Surgical History  Procedure Date  . Spine surgery     lumbar fusion  . Cholecystectomy   . Tonsillectomy   . Partial hip arthroplasty     right hip replacement    reports that she has never smoked. She does not have any smokeless tobacco history on file. She reports that she drinks alcohol. She reports that she does not use illicit  drugs. family history includes COPD in her father and mother and Heart disease in her brother and mother. Allergies  Allergen Reactions  . Pantoprazole Sodium        Objective:   Physical Exam  Constitutional: She is oriented to person, place, and time. She appears well-developed and well-nourished. No distress.  HENT:  Head: Normocephalic and atraumatic.  Right Ear: External ear normal.  Left Ear: External ear normal.  Nose: Nose normal.  Mouth/Throat: Oropharynx is clear and moist.  Eyes: Conjunctivae and EOM are normal. Pupils are equal, round, and reactive to light.  Neck: Normal range of motion. Neck supple. No JVD present. No tracheal deviation present. No thyromegaly present.  Cardiovascular: Normal rate, regular rhythm, normal heart sounds and intact distal pulses.   No murmur heard. Pulmonary/Chest: Effort normal and breath sounds normal. She has no wheezes. She exhibits no tenderness.  Abdominal: Soft. Bowel sounds are normal.  Musculoskeletal: Normal range of motion. She exhibits no edema and no tenderness.  Lymphadenopathy:    She has no cervical adenopathy.  Neurological: She is alert and oriented to person, place, and time. She has normal reflexes. No cranial nerve deficit.  Skin: Skin is warm and dry. She is not diaphoretic.  Psychiatric: She has a normal mood and affect. Her behavior is normal.          Assessment & Plan:   This is a routine physical examination for this healthy  Female. Reviewed all health maintenance protocols including mammography colonoscopy bone density and reviewed appropriate screening labs. Her immunization history was reviewed as well as her  current medications and allergies refills of her chronic medications were given and the plan for yearly health maintenance was discussed all orders and referrals were made as appropriate.  The patient has a history of chronic back pain has seen the neurosurgeon now DTs some gait issues with her back  pain she has hurt her knee she has a hot tender left knee that may be gout.  We'll check a uric acid today and refer her to a rheumatologist for further evaluation per her request.  Her blood pressure stable in her current medications refill sent to her pharmacy for B12 deficiency is currently stable and we refilled her medications. She also has struggled with weight and again we reiterated the need for weight loss and exercise.   As having the severe knee pain and erythema she did have some effusion and he presented like either gouty arthritis in the knee or a meniscal tear.  Patient gave informed consent and 40 mg of Depo-Medrol was injected into the intra-articular space on the left knee the patient tolerated the procedure well   No problem-specific assessment & plan notes found for this encounter.

## 2010-11-21 LAB — DIFFERENTIAL
Basophils Absolute: 0
Basophils Relative: 1
Eosinophils Absolute: 0.1
Eosinophils Relative: 1
Lymphocytes Relative: 45
Lymphs Abs: 2.7
Monocytes Absolute: 0.4
Monocytes Relative: 7
Neutro Abs: 2.8
Neutrophils Relative %: 46

## 2010-11-21 LAB — URINALYSIS, ROUTINE W REFLEX MICROSCOPIC
Bilirubin Urine: NEGATIVE
Glucose, UA: NEGATIVE
Ketones, ur: 15 — AB
Leukocytes, UA: NEGATIVE
Nitrite: NEGATIVE
Protein, ur: 30 — AB
Specific Gravity, Urine: 1.021
Urobilinogen, UA: 0.2
pH: 7

## 2010-11-21 LAB — CBC
HCT: 33.9 — ABNORMAL LOW
HCT: 37.5
Hemoglobin: 11.9 — ABNORMAL LOW
Hemoglobin: 12.6
MCHC: 33.8
MCHC: 35.1
MCV: 84.8
MCV: 85.1
Platelets: 235
Platelets: 292
RBC: 4
RBC: 4.41
RDW: 14.7
RDW: 15.1
WBC: 6.1
WBC: 9.6

## 2010-11-21 LAB — URINE MICROSCOPIC-ADD ON

## 2010-11-21 LAB — ABO/RH: ABO/RH(D): A POS

## 2010-11-21 LAB — BASIC METABOLIC PANEL
BUN: 5 — ABNORMAL LOW
BUN: 6
CO2: 26
CO2: 33 — ABNORMAL HIGH
Calcium: 8.5
Calcium: 8.6
Chloride: 104
Chloride: 98
Creatinine, Ser: 0.67
Creatinine, Ser: 0.68
GFR calc Af Amer: >60
GFR calc Af Amer: >60
GFR calc non Af Amer: >60
GFR calc non Af Amer: >60
Glucose, Bld: 117 — ABNORMAL HIGH
Glucose, Bld: 140 — ABNORMAL HIGH
Potassium: 2.8 — ABNORMAL LOW
Potassium: 3.5
Sodium: 137
Sodium: 139

## 2010-11-21 LAB — TYPE AND SCREEN
ABO/RH(D): A POS
Antibody Screen: NEGATIVE

## 2010-11-21 LAB — URINE CULTURE
Colony Count: NO GROWTH
Culture: NO GROWTH

## 2011-01-17 ENCOUNTER — Ambulatory Visit (INDEPENDENT_AMBULATORY_CARE_PROVIDER_SITE_OTHER): Payer: BC Managed Care – PPO | Admitting: Internal Medicine

## 2011-01-17 ENCOUNTER — Encounter: Payer: Self-pay | Admitting: Internal Medicine

## 2011-01-17 VITALS — BP 140/80 | HR 76 | Temp 98.2°F | Resp 16 | Ht 63.0 in | Wt 200.0 lb

## 2011-01-17 DIAGNOSIS — I10 Essential (primary) hypertension: Secondary | ICD-10-CM

## 2011-01-17 DIAGNOSIS — R109 Unspecified abdominal pain: Secondary | ICD-10-CM

## 2011-01-17 DIAGNOSIS — R102 Pelvic and perineal pain: Secondary | ICD-10-CM

## 2011-01-17 DIAGNOSIS — N12 Tubulo-interstitial nephritis, not specified as acute or chronic: Secondary | ICD-10-CM

## 2011-01-17 DIAGNOSIS — N390 Urinary tract infection, site not specified: Secondary | ICD-10-CM

## 2011-01-17 LAB — POCT URINALYSIS DIPSTICK
Bilirubin, UA: NEGATIVE
Glucose, UA: NEGATIVE
Ketones, UA: NEGATIVE
Nitrite, UA: NEGATIVE
Protein, UA: NEGATIVE
Spec Grav, UA: 1.025
Urobilinogen, UA: 0.2
pH, UA: 5.5

## 2011-01-17 MED ORDER — CEFTRIAXONE SODIUM 1 G IJ SOLR
1.0000 g | INTRAMUSCULAR | Status: AC
  Administered 2011-01-17: 1 g via INTRAMUSCULAR

## 2011-01-17 NOTE — Progress Notes (Signed)
  Subjective:    Patient ID: Brandy Dunlap, female    DOB: 1950-10-15, 60 y.o.   MRN: [id number]  HPI  UTI symptoms off and on for weeks UA at office today shows 1+ leukocytes but no nitrates she is symptomatic with dysuria and frequency.  She has a history of chronic pyelonephritis she is also followed for hypertension and a history of overactive bladder  Review of Systems  Constitutional: Negative for activity change, appetite change and fatigue.  HENT: Negative for ear pain, congestion, neck pain, postnasal drip and sinus pressure.   Eyes: Negative for redness and visual disturbance.  Respiratory: Negative for cough, shortness of breath and wheezing.   Gastrointestinal: Negative for abdominal pain and abdominal distention.  Genitourinary: Negative for dysuria, frequency and menstrual problem.  Musculoskeletal: Negative for myalgias, joint swelling and arthralgias.  Skin: Negative for rash and wound.  Neurological: Negative for dizziness, weakness and headaches.  Hematological: Negative for adenopathy. Does not bruise/bleed easily.  Psychiatric/Behavioral: Negative for sleep disturbance and decreased concentration.       Objective:   Physical Exam  Nursing note and vitals reviewed. Constitutional: She is oriented to person, place, and time. She appears well-developed and well-nourished. No distress.  HENT:  Head: Normocephalic and atraumatic.  Right Ear: External ear normal.  Left Ear: External ear normal.  Nose: Nose normal.  Mouth/Throat: Oropharynx is clear and moist.  Eyes: Conjunctivae and EOM are normal. Pupils are equal, round, and reactive to light.  Neck: Normal range of motion. Neck supple. No JVD present. No tracheal deviation present. No thyromegaly present.  Cardiovascular: Normal rate, regular rhythm, normal heart sounds and intact distal pulses.   No murmur heard. Pulmonary/Chest: Effort normal and breath sounds normal. She has no wheezes. She exhibits no  tenderness.  Abdominal: Soft. Bowel sounds are normal.  Musculoskeletal: Normal range of motion. She exhibits no edema and no tenderness.  Lymphadenopathy:    She has no cervical adenopathy.  Neurological: She is alert and oriented to person, place, and time. She has normal reflexes. No cranial nerve deficit.  Skin: Skin is warm and dry. She is not diaphoretic.  Psychiatric: She has a normal mood and affect. Her behavior is normal.          Assessment & Plan:  Treat her for an acute urinary tract infection with antibiotic called into her pharmacy Stable hypertension

## 2011-01-17 NOTE — Patient Instructions (Signed)
The patient is instructed to continue all medications as prescribed. Schedule followup with check out clerk upon leaving the clinic  

## 2011-04-16 ENCOUNTER — Telehealth: Payer: Self-pay | Admitting: Internal Medicine

## 2011-04-16 NOTE — Telephone Encounter (Signed)
Needs to see Padonda- dr Lovell Sheehan just doesn't have any openeings

## 2011-04-16 NOTE — Telephone Encounter (Signed)
Patient called stating that she has congestion, fever,and  body aches. Patient refuses to see another MD and would like to be worked in late today or first thing in the a.m. Please advise.

## 2011-04-16 NOTE — Telephone Encounter (Signed)
Called pt at work number and was told she is in a workshop at work.

## 2011-04-16 NOTE — Telephone Encounter (Signed)
Left message to call back  

## 2011-04-17 ENCOUNTER — Ambulatory Visit (INDEPENDENT_AMBULATORY_CARE_PROVIDER_SITE_OTHER): Payer: BC Managed Care – PPO | Admitting: Family

## 2011-04-17 ENCOUNTER — Encounter: Payer: Self-pay | Admitting: Family

## 2011-04-17 VITALS — BP 152/90 | HR 90 | Temp 98.8°F

## 2011-04-17 DIAGNOSIS — R05 Cough: Secondary | ICD-10-CM

## 2011-04-17 DIAGNOSIS — R059 Cough, unspecified: Secondary | ICD-10-CM

## 2011-04-17 DIAGNOSIS — J019 Acute sinusitis, unspecified: Secondary | ICD-10-CM

## 2011-04-17 MED ORDER — AZITHROMYCIN 250 MG PO TABS: ORAL_TABLET | ORAL | Status: AC

## 2011-04-17 MED ORDER — HYDROCOD POLST-CHLORPHEN POLST 10-8 MG/5ML PO LQCR: 5.0000 mL | Freq: Two times a day (BID) | ORAL | Status: DC | PRN

## 2011-04-17 NOTE — Patient Instructions (Signed)

## 2011-04-17 NOTE — Progress Notes (Signed)
Subjective:    Patient ID: Brandy Dunlap, female    DOB: 11-29-1950, 61 y.o.   MRN: [id number]  HPI A 61 year old white female, nonsmoker, patient of Dr. Lovell Sheehan presents today with sinus pressure and pain, hacking cough, sneezing, nasal congestion that's been going on for one week and worsening. She's been taking over-the-counter Alka-Seltzer plus with no relief. Denies any fever or muscle aches.   Review of Systems  Constitutional: Negative.   HENT: Positive for congestion, sore throat, rhinorrhea, sneezing, postnasal drip and sinus pressure.   Eyes: Negative.   Respiratory: Positive for cough.   Cardiovascular: Negative.   Skin: Negative.   Hematological: Negative.    Past Medical History  Diagnosis Date  . Low back pain   . GERD (gastroesophageal reflux disease)   . Hypertension   . Osteoarthritis     History   Social History  . Marital Status: Married    Spouse Name: N/A    Number of Children: N/A  . Years of Education: N/A   Occupational History  . Not on file.   Social History Main Topics  . Smoking status: Never Smoker   . Smokeless tobacco: Not on file  . Alcohol Use: Yes  . Drug Use: No  . Sexually Active: Yes   Other Topics Concern  . Not on file   Social History Narrative  . No narrative on file    Past Surgical History  Procedure Date  . Spine surgery     lumbar fusion  . Cholecystectomy   . Tonsillectomy   . Partial hip arthroplasty     right hip replacement    Family History  Problem Relation Age of Onset  . COPD Father     died at age 17  . Heart disease Brother     stent with 90%  . COPD Mother   . Heart disease Mother     Allergies  Allergen Reactions  . Pantoprazole Sodium     Current Outpatient Prescriptions on File Prior to Visit  Medication Sig Dispense Refill  . esomeprazole (NEXIUM) 40 MG capsule Take 1 capsule (40 mg total) by mouth daily before breakfast.  90 capsule  3  . Estradiol-Norethindrone Acet (ACTIVELLA)  0.5-0.1 MG per tablet Take 1 tablet by mouth daily.  90 tablet  3  . Folic Acid-Vit B6-Vit B12 (FOLBEE) 2.5-25-1 MG TABS Take 1 tablet by mouth daily.  90 tablet  3  . Hydrocodone-Acetaminophen 10-660 MG TABS Take by mouth as needed.        . Multiple Vitamin (MULTIVITAMIN) capsule Take 1 capsule by mouth daily.        [name] telmisartan (MICARDIS) 40 MG tablet Take 1 tablet (40 mg total) by mouth daily.  90 tablet  3  . SOMA 350 MG tablet         BP 152/90  Pulse 90  Temp(Src) 98.8 F (37.1 C) (Oral)  SpO2 98%chart    Objective:   Physical Exam  Constitutional: She is oriented to person, place, and time. She appears well-developed and well-nourished.  HENT:  Right Ear: External ear normal.  Left Ear: External ear normal.  Nose: Nose normal.  Mouth/Throat: Oropharynx is clear and moist.       Right maxillary sinus tenderness to palpation.  Neck: Normal range of motion. Neck supple.  Cardiovascular: Normal rate, regular rhythm and normal heart sounds.   Pulmonary/Chest: Effort normal and breath sounds normal.  Neurological: She is alert and oriented to person, place,  and time.  Skin: Skin is warm and dry.  Psychiatric: She has a normal mood and affect.          Assessment & Plan:  Assessment: Acute sinusitis, cough  Plan: Z-Pak as directed. Continue Alka-Seltzer plus. Call the office if symptoms worsen or persist, recheck as scheduled and when necessary. Rest. Drink clear fluids.

## 2011-05-16 ENCOUNTER — Other Ambulatory Visit: Payer: Self-pay | Admitting: *Deleted

## 2011-05-16 MED ORDER — HYDROCODONE-ACETAMINOPHEN 10-660 MG PO TABS: 10.0000 mg | ORAL_TABLET | ORAL | Status: DC | PRN

## 2011-05-23 ENCOUNTER — Ambulatory Visit: Payer: BC Managed Care – PPO | Admitting: Internal Medicine

## 2011-06-06 ENCOUNTER — Ambulatory Visit (INDEPENDENT_AMBULATORY_CARE_PROVIDER_SITE_OTHER): Payer: BC Managed Care – PPO | Admitting: Internal Medicine

## 2011-06-06 VITALS — BP 122/72 | HR 72 | Temp 98.2°F | Resp 16 | Ht 63.0 in | Wt 199.0 lb

## 2011-06-06 DIAGNOSIS — N390 Urinary tract infection, site not specified: Secondary | ICD-10-CM

## 2011-06-06 DIAGNOSIS — R300 Dysuria: Secondary | ICD-10-CM

## 2011-06-06 LAB — POCT URINALYSIS DIPSTICK
Bilirubin, UA: NEGATIVE
Glucose, UA: NEGATIVE
Nitrite, UA: NEGATIVE
Spec Grav, UA: >=1.03
Urobilinogen, UA: 0.2
pH, UA: 6

## 2011-06-06 MED ORDER — CIPROFLOXACIN HCL 500 MG PO TABS: 500.0000 mg | ORAL_TABLET | Freq: Two times a day (BID) | ORAL | Status: AC

## 2011-06-08 ENCOUNTER — Encounter: Payer: Self-pay | Admitting: Internal Medicine

## 2011-06-08 NOTE — Progress Notes (Signed)
  Subjective:    Patient ID: Brandy Dunlap, female    DOB: [birth year], 61 y.o.   MRN: [id number]  HPI  Patient is a 61 year old female who presents for an acute urinary tract infection.  Should symptoms of dysuria for about 3-4 days prior to the office visit.  She had a urinalysis which showed nitrates and leukocytes.  A culture will be obtained The patient denies back pain fever or chill  Review of Systems  Genitourinary: Positive for dysuria and frequency.       Objective:   Physical Exam  Nursing note and vitals reviewed. Constitutional: She is oriented to person, place, and time. She appears well-developed and well-nourished. No distress.  HENT:  Head: Normocephalic and atraumatic.  Right Ear: External ear normal.  Left Ear: External ear normal.  Nose: Nose normal.  Mouth/Throat: Oropharynx is clear and moist.  Eyes: Conjunctivae and EOM are normal. Pupils are equal, round, and reactive to light.  Neck: Normal range of motion. Neck supple. No JVD present. No tracheal deviation present. No thyromegaly present.  Cardiovascular: Normal rate, regular rhythm, normal heart sounds and intact distal pulses.   No murmur heard. Pulmonary/Chest: Effort normal and breath sounds normal. She has no wheezes. She exhibits no tenderness.  Abdominal: Soft. Bowel sounds are normal.  Musculoskeletal: Normal range of motion. She exhibits no edema and no tenderness.  Lymphadenopathy:    She has no cervical adenopathy.  Neurological: She is alert and oriented to person, place, and time. She has normal reflexes. No cranial nerve deficit.  Skin: Skin is warm and dry. She is not diaphoretic.  Psychiatric: She has a normal mood and affect. Her behavior is normal.          Assessment & Plan:  Probably uncomplicated urinary tract infection with the patient with a history of multiple urinary tract infections related to mild bladder retention and obesity.  We'll treat acute infection continue  to monitor Antibiotics and sent to pharmacy

## 2011-07-18 ENCOUNTER — Other Ambulatory Visit: Payer: Self-pay | Admitting: *Deleted

## 2011-07-18 MED ORDER — HYDROCODONE-ACETAMINOPHEN 10-660 MG PO TABS: 10.0000 mg | ORAL_TABLET | ORAL | Status: DC | PRN

## 2011-08-10 ENCOUNTER — Other Ambulatory Visit: Payer: Self-pay | Admitting: *Deleted

## 2011-08-10 DIAGNOSIS — I10 Essential (primary) hypertension: Secondary | ICD-10-CM

## 2011-08-10 MED ORDER — TELMISARTAN 40 MG PO TABS: 40.0000 mg | ORAL_TABLET | Freq: Every day | ORAL | Status: DC

## 2011-08-20 ENCOUNTER — Other Ambulatory Visit: Payer: Self-pay | Admitting: *Deleted

## 2011-08-20 MED ORDER — HYDROCODONE-ACETAMINOPHEN 10-660 MG PO TABS: 10.0000 mg | ORAL_TABLET | ORAL | Status: DC | PRN

## 2011-09-26 ENCOUNTER — Other Ambulatory Visit (INDEPENDENT_AMBULATORY_CARE_PROVIDER_SITE_OTHER): Payer: BC Managed Care – PPO

## 2011-09-26 DIAGNOSIS — Z0000 Encounter for general adult medical examination without abnormal findings: Secondary | ICD-10-CM

## 2011-09-26 LAB — CBC WITH DIFFERENTIAL/PLATELET
Basophils Absolute: 0.1 10*3/uL (ref 0.0–0.1)
Basophils Relative: 0.9 % (ref 0.0–3.0)
Eosinophils Absolute: 0.1 10*3/uL (ref 0.0–0.7)
Eosinophils Relative: 1.3 % (ref 0.0–5.0)
HCT: 36.5 % (ref 36.0–46.0)
Hemoglobin: 12 g/dL (ref 12.0–15.0)
Lymphocytes Relative: 39.4 % (ref 12.0–46.0)
Lymphs Abs: 2.5 10*3/uL (ref 0.7–4.0)
MCHC: 32.9 g/dL (ref 30.0–36.0)
MCV: 88.9 fl (ref 78.0–100.0)
Monocytes Absolute: 0.5 10*3/uL (ref 0.1–1.0)
Monocytes Relative: 7.7 % (ref 3.0–12.0)
Neutro Abs: 3.2 10*3/uL (ref 1.4–7.7)
Neutrophils Relative %: 50.7 % (ref 43.0–77.0)
Platelets: 185 10*3/uL (ref 150.0–400.0)
RBC: 4.1 Mil/uL (ref 3.87–5.11)
RDW: 15.4 % — ABNORMAL HIGH (ref 11.5–14.6)
WBC: 6.3 10*3/uL (ref 4.5–10.5)

## 2011-09-26 LAB — POCT URINALYSIS DIPSTICK
Bilirubin, UA: NEGATIVE
Glucose, UA: NEGATIVE
Nitrite, UA: NEGATIVE
Protein, UA: NEGATIVE
Spec Grav, UA: 1.025
Urobilinogen, UA: 0.2
pH, UA: 6.5

## 2011-09-26 LAB — HEPATIC FUNCTION PANEL
ALT: 25 U/L (ref 0–35)
AST: 24 U/L (ref 0–37)
Albumin: 3.8 g/dL (ref 3.5–5.2)
Alkaline Phosphatase: 43 U/L (ref 39–117)
Bilirubin, Direct: 0.1 mg/dL (ref 0.0–0.3)
Total Bilirubin: 0.4 mg/dL (ref 0.3–1.2)
Total Protein: 6.8 g/dL (ref 6.0–8.3)

## 2011-09-26 LAB — LIPID PANEL
Cholesterol: 189 mg/dL (ref 0–200)
HDL: 56.6 mg/dL (ref >39.00)
LDL Cholesterol: 109 mg/dL — ABNORMAL HIGH (ref 0–99)
Total CHOL/HDL Ratio: 3
Triglycerides: 116 mg/dL (ref 0.0–149.0)
VLDL: 23.2 mg/dL (ref 0.0–40.0)

## 2011-09-26 LAB — BASIC METABOLIC PANEL
BUN: 13 mg/dL (ref 6–23)
CO2: 26 mEq/L (ref 19–32)
Calcium: 9.6 mg/dL (ref 8.4–10.5)
Chloride: 105 mEq/L (ref 96–112)
Creatinine, Ser: 0.7 mg/dL (ref 0.4–1.2)
GFR: 91.96 mL/min (ref >60.00)
Glucose, Bld: 88 mg/dL (ref 70–99)
Potassium: 4.3 mEq/L (ref 3.5–5.1)
Sodium: 139 mEq/L (ref 135–145)

## 2011-09-26 LAB — TSH: TSH: 0.96 u[IU]/mL (ref 0.35–5.50)

## 2011-10-03 ENCOUNTER — Encounter: Payer: Self-pay | Admitting: Internal Medicine

## 2011-10-03 ENCOUNTER — Ambulatory Visit (INDEPENDENT_AMBULATORY_CARE_PROVIDER_SITE_OTHER): Payer: BC Managed Care – PPO | Admitting: Internal Medicine

## 2011-10-03 VITALS — BP 160/90 | HR 76 | Temp 98.2°F | Resp 16 | Ht 63.0 in | Wt 196.0 lb

## 2011-10-03 DIAGNOSIS — Z2911 Encounter for prophylactic immunotherapy for respiratory syncytial virus (RSV): Secondary | ICD-10-CM

## 2011-10-03 DIAGNOSIS — G4733 Obstructive sleep apnea (adult) (pediatric): Secondary | ICD-10-CM

## 2011-10-03 DIAGNOSIS — R82998 Other abnormal findings in urine: Secondary | ICD-10-CM

## 2011-10-03 DIAGNOSIS — N951 Menopausal and female climacteric states: Secondary | ICD-10-CM

## 2011-10-03 DIAGNOSIS — Z0000 Encounter for general adult medical examination without abnormal findings: Secondary | ICD-10-CM

## 2011-10-03 DIAGNOSIS — R079 Chest pain, unspecified: Secondary | ICD-10-CM

## 2011-10-03 DIAGNOSIS — I10 Essential (primary) hypertension: Secondary | ICD-10-CM

## 2011-10-03 MED ORDER — TELMISARTAN 80 MG PO TABS: 80.0000 mg | ORAL_TABLET | Freq: Every day | ORAL | Status: DC

## 2011-10-03 NOTE — Addendum Note (Signed)
Addended by: Willy Eddy on: 10/03/2011 03:29 PM   Modules accepted: Orders

## 2011-10-03 NOTE — Progress Notes (Signed)
  Subjective:    Patient ID: Brandy Dunlap, female    DOB: 1950/05/23, 61 y.o.   MRN: [id number]  HPI    Review of Systems  Constitutional: Negative for activity change, appetite change and fatigue.  HENT: Negative for ear pain, congestion, neck pain, postnasal drip and sinus pressure.   Eyes: Negative for redness and visual disturbance.  Respiratory: Negative for cough, shortness of breath and wheezing.   Gastrointestinal: Negative for abdominal pain and abdominal distention.  Genitourinary: Negative for dysuria, frequency and menstrual problem.  Musculoskeletal: Negative for myalgias, joint swelling and arthralgias.  Skin: Negative for rash and wound.  Neurological: Negative for dizziness, weakness and headaches.  Hematological: Negative for adenopathy. Does not bruise/bleed easily.  Psychiatric/Behavioral: Negative for disturbed wake/sleep cycle and decreased concentration.       Objective:   Physical Exam  Nursing note and vitals reviewed. Constitutional: She is oriented to person, place, and time. She appears well-developed and well-nourished. No distress.  HENT:  Head: Normocephalic and atraumatic.  Right Ear: External ear normal.  Left Ear: External ear normal.  Nose: Nose normal.  Mouth/Throat: Oropharynx is clear and moist.  Eyes: Conjunctivae and EOM are normal. Pupils are equal, round, and reactive to light.  Neck: Normal range of motion. Neck supple. No JVD present. No tracheal deviation present. No thyromegaly present.  Cardiovascular: Normal rate, regular rhythm, normal heart sounds and intact distal pulses.   No murmur heard.      Tender at the xiphoid process  Pulmonary/Chest: Effort normal and breath sounds normal. She has no wheezes. She exhibits no tenderness.  Abdominal: Soft. Bowel sounds are normal.  Musculoskeletal: Normal range of motion. She exhibits no edema and no tenderness.  Lymphadenopathy:    She has no cervical adenopathy.  Neurological: She  is alert and oriented to person, place, and time. She has normal reflexes. No cranial nerve deficit.  Skin: Skin is warm and dry. She is not diaphoretic.  Psychiatric: She has a normal mood and affect. Her behavior is normal.          Assessment & Plan:  Atypical chest pain referred for Cardiolite.  The xiphoid process tenderness and pain injected with point injection for pain.  Shingles vaccination given today.  4 hypertension increase Micardis to 80 mg by mouth daily.  Followup in 4 weeks.  Symptomatic menopause monitor her [facility] LH and estrogen levels.   This is a routine physical examination for this healthy  Female. Reviewed all health maintenance protocols including mammography colonoscopy bone density and reviewed appropriate screening labs. Her immunization history was reviewed as well as her current medications and allergies refills of her chronic medications were given and the plan for yearly health maintenance was discussed all orders and referrals were made as appropriate.

## 2011-10-03 NOTE — Progress Notes (Signed)
  Subjective:    Patient ID: Brandy Dunlap, female    DOB: [birth year], 61 y.o.   MRN: [id number]  HPI Severe fatigue, night sweats, intermittent chest pain without SOB, No reported GERD symptoms, Can walk without chest pain.  Presents for CPX Screening labs reviewed Sleep disorder more acute. Elevated blood pressure    Review of Systems     Objective:   Physical Exam        Assessment & Plan:

## 2011-10-04 ENCOUNTER — Other Ambulatory Visit: Payer: Self-pay | Admitting: Internal Medicine

## 2011-10-04 DIAGNOSIS — R0681 Apnea, not elsewhere classified: Secondary | ICD-10-CM

## 2011-10-04 LAB — LUTEINIZING HORMONE: LH: 19.71 m[IU]/mL

## 2011-10-04 LAB — FOLLICLE STIMULATING HORMONE: FSH: 50.6 m[IU]/mL

## 2011-10-05 ENCOUNTER — Other Ambulatory Visit: Payer: Self-pay | Admitting: *Deleted

## 2011-10-05 LAB — URINE CULTURE: Colony Count: 40000

## 2011-10-05 MED ORDER — HYDROCODONE-ACETAMINOPHEN 10-660 MG PO TABS: 10.0000 mg | ORAL_TABLET | ORAL | Status: DC | PRN

## 2011-10-10 ENCOUNTER — Ambulatory Visit (HOSPITAL_COMMUNITY): Payer: BC Managed Care – PPO | Attending: Internal Medicine | Admitting: Radiology

## 2011-10-10 VITALS — BP 139/79 | Ht 63.0 in | Wt 198.0 lb

## 2011-10-10 DIAGNOSIS — I10 Essential (primary) hypertension: Secondary | ICD-10-CM

## 2011-10-10 DIAGNOSIS — R5383 Other fatigue: Secondary | ICD-10-CM | POA: Insufficient documentation

## 2011-10-10 DIAGNOSIS — R079 Chest pain, unspecified: Secondary | ICD-10-CM

## 2011-10-10 DIAGNOSIS — Z136 Encounter for screening for cardiovascular disorders: Secondary | ICD-10-CM | POA: Insufficient documentation

## 2011-10-10 DIAGNOSIS — R5381 Other malaise: Secondary | ICD-10-CM | POA: Insufficient documentation

## 2011-10-10 MED ORDER — TECHNETIUM TC 99M TETROFOSMIN IV KIT
11.0000 | PACK | Freq: Once | INTRAVENOUS | Status: AC | PRN
  Administered 2011-10-10: 11 via INTRAVENOUS

## 2011-10-10 MED ORDER — TECHNETIUM TC 99M TETROFOSMIN IV KIT
33.0000 | PACK | Freq: Once | INTRAVENOUS | Status: AC | PRN
  Administered 2011-10-10: 33 via INTRAVENOUS

## 2011-10-10 NOTE — Progress Notes (Signed)
[facility] SITE 3 NUCLEAR MED [address] [place] [address] [phone]  Cardiology Nuclear Med Study  Brandy Dunlap is a 61 y.o. female     MRN : [id number]     DOB: 06/06/1950  Procedure Date: 10/10/2011  Nuclear Med Background Indication for Stress Test:  Evaluation for Ischemia History:  2010 MPS EF 76%, Normal Cardiac Risk Factors: Family History - CAD and Hypertension  Symptoms:  Chest Pain and Fatigue   Nuclear Pre-Procedure Caffeine/Decaff Intake:  6:30am NPO After: 10:00pm   Lungs:  clear O2 Sat: 99% on room air. IV 0.9% NS with Angio Cath:  22g  IV Site: R Hand  IV Started by:  Bonnita Levan, RN  Chest Size (in):  38 Cup Size: C  Height: 5\' 3"  (1.6 m)  Weight:  198 lb (89.812 kg)  BMI:  Body mass index is 35.07 kg/(m^2). Tech Comments:  N/A    Nuclear Med Study 1 or 2 day study: 1 day  Stress Test Type:  Stress  Reading MD: Kristeen Miss, MD  Order Authorizing Provider:  Darryll Capers, MD  Resting Radionuclide: Technetium 54m Tetrofosmin  Resting Radionuclide Dose: 11.0 mCi   Stress Radionuclide:  Technetium 23m Tetrofosmin  Stress Radionuclide Dose: 33.0 mCi           Stress Protocol Rest HR: 64 Stress HR: 144  Rest BP: 139/79 Stress BP: 213/73  Exercise Time (min): 7:02 METS: 7.8   Predicted Max HR: 160 bpm % Max HR: 90 bpm Rate Pressure Product: 21308   Dose of Adenosine (mg):  n/a Dose of Lexiscan: n/a mg  Dose of Atropine (mg): n/a Dose of Dobutamine: n/a mcg/kg/min (at max HR)  Stress Test Technologist: Bonnita Levan, RN  Nuclear Technologist:  Domenic Polite, CNMT     Rest Procedure:  Myocardial perfusion imaging was performed at rest 45 minutes following the intravenous administration of Technetium 59m Tetrofosmin. Rest ECG: Sinus Rhythm with T wave abnormality    Stress Procedure:  The patient performed treadmill exercise using a Bruce  Protocol for 7:02 minutes. The patient stopped due to fatigue and dyspnea and c/o CP  (2/10) with exercise, which resolved in recovery.  There were nonspecific ST-T wave changes.  Technetium 83m Tetrofosmin was injected at peak exercise and myocardial perfusion imaging was performed after a brief delay. Stress ECG: No significant change from baseline ECG  QPS Raw Data Images:  Normal; no motion artifact; normal heart/lung ratio. Stress Images:  Normal homogeneous uptake in all areas of the myocardium. Rest Images:  Normal homogeneous uptake in all areas of the myocardium. Subtraction (SDS):  No evidence of ischemia. Transient Ischemic Dilatation (Normal <1.22):  0.89 Lung/Heart Ratio (Normal <0.45):  0.30  Quantitative Gated Spect Images QGS EDV:  65 ml QGS ESV:  17 ml  Impression Exercise Capacity:  Good exercise capacity. BP Response:  Normal blood pressure response. Clinical Symptoms:  Mild chest pain/dyspnea. ECG Impression:  No significant ST segment change suggestive of ischemia. Comparison with Prior Nuclear Study: No images to compare  Overall Impression:  Normal stress nuclear study.  No evidence of ischemia.  Normal LV function. LV Ejection Fraction: 73%.  LV Wall Motion:  NL LV Function; NL Wall Motion.    Vesta Mixer, Montez Hageman., MD, [facility] 10/10/2011, 5:38 PM Office - [phone] Pager [phone]

## 2011-10-12 ENCOUNTER — Telehealth: Payer: Self-pay | Admitting: Internal Medicine

## 2011-10-12 DIAGNOSIS — K219 Gastro-esophageal reflux disease without esophagitis: Secondary | ICD-10-CM

## 2011-10-12 MED ORDER — ESOMEPRAZOLE MAGNESIUM 40 MG PO CPDR: 40.0000 mg | DELAYED_RELEASE_CAPSULE | Freq: Every day | ORAL | Status: DC

## 2011-10-12 MED ORDER — CONJ ESTROG-MEDROXYPROGEST ACE 0.45-1.5 MG PO TABS: 1.0000 | ORAL_TABLET | Freq: Every day | ORAL | Status: DC

## 2011-10-12 NOTE — Telephone Encounter (Signed)
Per dr Estrella Deeds menopausal but needs to change activella to prempro .45/1.5 qd-cardiolite was normal per dr Lovell Sheehan- pt informed-meds to [facility]

## 2011-10-12 NOTE — Telephone Encounter (Signed)
done

## 2011-10-12 NOTE — Telephone Encounter (Signed)
Patient called stating that she would like a call back with all test and lab results and she need a refill of nexium 40mg  and activella for a year supply sent to [facility]. Please assist.

## 2011-11-09 ENCOUNTER — Encounter (HOSPITAL_BASED_OUTPATIENT_CLINIC_OR_DEPARTMENT_OTHER): Payer: BC Managed Care – PPO

## 2011-11-21 ENCOUNTER — Ambulatory Visit: Payer: BC Managed Care – PPO | Admitting: Internal Medicine

## 2011-11-23 ENCOUNTER — Ambulatory Visit (HOSPITAL_BASED_OUTPATIENT_CLINIC_OR_DEPARTMENT_OTHER): Payer: BC Managed Care – PPO | Attending: Internal Medicine

## 2011-11-23 VITALS — Ht 63.0 in | Wt 190.0 lb

## 2011-11-23 DIAGNOSIS — G4710 Hypersomnia, unspecified: Secondary | ICD-10-CM | POA: Insufficient documentation

## 2011-11-23 DIAGNOSIS — G4733 Obstructive sleep apnea (adult) (pediatric): Secondary | ICD-10-CM

## 2011-11-23 DIAGNOSIS — G4730 Sleep apnea, unspecified: Secondary | ICD-10-CM | POA: Insufficient documentation

## 2011-11-23 DIAGNOSIS — R0681 Apnea, not elsewhere classified: Secondary | ICD-10-CM

## 2011-12-01 DIAGNOSIS — G4710 Hypersomnia, unspecified: Secondary | ICD-10-CM

## 2011-12-01 DIAGNOSIS — G4730 Sleep apnea, unspecified: Secondary | ICD-10-CM

## 2011-12-01 NOTE — Procedures (Signed)
Brandy Dunlap, Brandy Dunlap               ACCOUNT NO.:  [id number]  MEDICAL RECORD NO.:  [id number]          PATIENT TYPE:  OUT  LOCATION:  SLEEP CENTER                 FACILITY:  [facility]  PHYSICIAN:  Barbaraann Share, MD,FCCPDATE OF BIRTH:  1950/04/[birth year]  DATE OF STUDY:  11/23/2011                           NOCTURNAL POLYSOMNOGRAM  REFERRING PHYSICIAN:  Stacie Glaze, MD  INDICATION FOR STUDY:  Hypersomnia with sleep apnea.  EPWORTH SLEEPINESS SCORE:  14.  SLEEP ARCHITECTURE:  The patient had a total sleep time of 375 minutes with decreased slow wave sleep as well as REM.  Sleep onset latency was normal at 16 minutes, and REM onset was prolonged at 208 minutes.  Sleep efficiency was poor at 67%.  RESPIRATORY DATA:  The patient was found to have 2 apneas and 21 obstructive hypopneas, giving her an apnea/hypopnea index of only 4 events per hour.  The events occurred in all body positions, but were most common during REM.  Loud snoring was noted throughout.  The patient did not meet split night protocol secondary to her small numbers of events.  OXYGEN DATA:  There was O2 desaturation as low as 88% during the night, and primarily associated with her obstructive events.  CARDIAC DATA:  No clinically significant arrhythmias were noted.  MOVEMENT/PARASOMNIA:  The patient had no significant leg jerks or other abnormal behavior seen.  IMPRESSION/RECOMMENDATION:  Small numbers of obstructive events, which do not meet the AHI criteria for the obstructive sleep apnea syndrome. The patient was noted to have loud snoring as well as an abnormal Epworth sleepiness score, and therefore may have a presleep apnea condition such as the upper airway resistance syndrome.  Treatment for this can include aggressive weight loss, positional therapy, upper airway surgery, as well as dental appliance.     Barbaraann Share, MD,FCCP Diplomate, American Board of Sleep Medicine   KMC/MEDQ  D:  12/01/2011  14:37:33  T:  12/01/2011 20:19:19  Job:  [id number]

## 2011-12-04 ENCOUNTER — Ambulatory Visit: Payer: BC Managed Care – PPO | Admitting: Internal Medicine

## 2011-12-09 ENCOUNTER — Other Ambulatory Visit: Payer: Self-pay | Admitting: Internal Medicine

## 2012-01-01 ENCOUNTER — Encounter: Payer: Self-pay | Admitting: Internal Medicine

## 2012-01-01 ENCOUNTER — Ambulatory Visit (INDEPENDENT_AMBULATORY_CARE_PROVIDER_SITE_OTHER): Payer: BC Managed Care – PPO | Admitting: Internal Medicine

## 2012-01-01 ENCOUNTER — Other Ambulatory Visit: Payer: Self-pay | Admitting: *Deleted

## 2012-01-01 VITALS — BP 150/80 | HR 72 | Temp 98.2°F | Resp 16 | Ht 63.0 in | Wt 202.0 lb

## 2012-01-01 DIAGNOSIS — R300 Dysuria: Secondary | ICD-10-CM

## 2012-01-01 DIAGNOSIS — M545 Low back pain, unspecified: Secondary | ICD-10-CM

## 2012-01-01 DIAGNOSIS — I10 Essential (primary) hypertension: Secondary | ICD-10-CM

## 2012-01-01 LAB — POCT URINALYSIS DIPSTICK
Bilirubin, UA: NEGATIVE
Glucose, UA: NEGATIVE
Ketones, UA: NEGATIVE
Nitrite, UA: NEGATIVE
Protein, UA: NEGATIVE
Spec Grav, UA: 1.01
Urobilinogen, UA: 0.2
pH, UA: 7

## 2012-01-01 MED ORDER — SULFAMETHOXAZOLE-TRIMETHOPRIM 800-160 MG PO TABS: 1.0000 | ORAL_TABLET | Freq: Two times a day (BID) | ORAL | Status: DC

## 2012-01-01 NOTE — Patient Instructions (Addendum)
Practical paleo looked this up on [employer].com you can download it for $7 or get the book for 20

## 2012-01-01 NOTE — Progress Notes (Signed)
  Subjective:    Patient ID: Brandy Dunlap, female    DOB: January 02, 1951, 61 y.o.   MRN: [id number]  HPI Weight issues and sleep apnea screening Weight loss is the key to this reviewed the paleo diet Reviewed the cardiac study   Review of Systems  Constitutional: Negative for activity change, appetite change and fatigue.  HENT: Negative for ear pain, congestion, neck pain, postnasal drip and sinus pressure.   Eyes: Negative for redness and visual disturbance.  Respiratory: Positive for apnea and wheezing. Negative for cough and shortness of breath.   Gastrointestinal: Negative for abdominal pain and abdominal distention.  Genitourinary: Negative for dysuria, frequency and menstrual problem.  Musculoskeletal: Negative for myalgias, joint swelling and arthralgias.  Skin: Negative for rash and wound.  Neurological: Negative for dizziness, weakness and headaches.  Hematological: Negative for adenopathy. Does not bruise/bleed easily.  Psychiatric/Behavioral: Negative for sleep disturbance and decreased concentration.       Objective:   Physical Exam  Nursing note and vitals reviewed. Constitutional: She is oriented to person, place, and time. She appears well-developed and well-nourished. No distress.  HENT:  Head: Normocephalic and atraumatic.  Right Ear: External ear normal.  Left Ear: External ear normal.  Nose: Nose normal.  Mouth/Throat: Oropharynx is clear and moist.  Eyes: Conjunctivae normal and EOM are normal. Pupils are equal, round, and reactive to light.  Neck: Normal range of motion. Neck supple. No JVD present. No tracheal deviation present. No thyromegaly present.  Cardiovascular: Normal rate, regular rhythm, normal heart sounds and intact distal pulses.   No murmur heard. Pulmonary/Chest: Effort normal and breath sounds normal. She has no wheezes. She exhibits no tenderness.  Abdominal: Soft. Bowel sounds are normal.  Musculoskeletal: Normal range of motion. She  exhibits no edema and no tenderness.  Lymphadenopathy:    She has no cervical adenopathy.  Neurological: She is alert and oriented to person, place, and time. She has normal reflexes. No cranial nerve deficit.  Skin: Skin is warm and dry. She is not diaphoretic.  Psychiatric: She has a normal mood and affect. Her behavior is normal.          Assessment & Plan:  Out sleep apnea.  Weight loss is the best intervention at this time.  Normal cardiovascular study with no evidence of ischemic disease.  Stable hypertension on her current medications with repeat blood pressure 140/80  Positive UTI will treated with septraDS

## 2012-01-04 LAB — URINE CULTURE: Colony Count: 90000

## 2012-02-07 ENCOUNTER — Telehealth: Payer: Self-pay | Admitting: Internal Medicine

## 2012-02-07 MED ORDER — NITROFURANTOIN MONOHYD MACRO 100 MG PO CAPS: 100.0000 mg | ORAL_CAPSULE | Freq: Two times a day (BID) | ORAL | Status: DC

## 2012-02-07 NOTE — Telephone Encounter (Signed)
Per dr Lovell Sheehan may have macrobid 100 bid for 14 days--pt informed

## 2012-02-07 NOTE — Telephone Encounter (Signed)
Was here 2wks ago - rx'd abx for kidney infection. Abx done, kidney infection has come right back. Wants to know if she can get diff abx called in? [facility] [address] Please advise. 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

## 2012-02-07 NOTE — Telephone Encounter (Signed)
done

## 2012-02-12 ENCOUNTER — Other Ambulatory Visit: Payer: Self-pay | Admitting: Internal Medicine

## 2012-02-15 ENCOUNTER — Ambulatory Visit (INDEPENDENT_AMBULATORY_CARE_PROVIDER_SITE_OTHER): Payer: BC Managed Care – PPO | Admitting: Internal Medicine

## 2012-02-15 ENCOUNTER — Encounter: Payer: Self-pay | Admitting: Internal Medicine

## 2012-02-15 VITALS — BP 130/80 | HR 84 | Temp 98.3°F | Resp 20

## 2012-02-15 DIAGNOSIS — I10 Essential (primary) hypertension: Secondary | ICD-10-CM

## 2012-02-15 DIAGNOSIS — J069 Acute upper respiratory infection, unspecified: Secondary | ICD-10-CM

## 2012-02-15 MED ORDER — HYDROCOD POLST-CHLORPHEN POLST 10-8 MG/5ML PO LQCR: 5.0000 mL | Freq: Two times a day (BID) | ORAL | Status: DC | PRN

## 2012-02-15 NOTE — Patient Instructions (Signed)
Take over-the-counter expectorants and cough medications such as  Mucinex DM.  Call if there is no improvement in 5 to 7 days or if he developed worsening cough, fever, or new symptoms, such as shortness of breath or chest pain. 

## 2012-02-15 NOTE — Progress Notes (Signed)
Subjective:    Patient ID: Brandy Dunlap, female    DOB: Mar 27, 1950, 61 y.o.   MRN: [id number]  HPI  61 year old patient who has treated hypertension. She presents today with a two-week history of nonproductive cough congestion and a general sense of unwellness. 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 Has been no fever. She has been treated recently for a UTI with sulfa antibiotics and presently Macrobid. Past Medical History  Diagnosis Date  . Low back pain   . GERD (gastroesophageal reflux disease)   . Hypertension   . Osteoarthritis     History   Social History  . Marital Status: Married    Spouse Name: N/A    Number of Children: N/A  . Years of Education: N/A   Occupational History  . Not on file.   Social History Main Topics  . Smoking status: Never Smoker   . Smokeless tobacco: Not on file  . Alcohol Use: Yes  . Drug Use: No  . Sexually Active: Yes   Other Topics Concern  . Not on file   Social History Narrative  . No narrative on file    Past Surgical History  Procedure Date  . Spine surgery     lumbar fusion  . Cholecystectomy   . Tonsillectomy   . Partial hip arthroplasty     right hip replacement    Family History  Problem Relation Age of Onset  . COPD Father     died at age 61  . Heart disease Brother     stent with 90%  . COPD Mother   . Heart disease Mother     Allergies  Allergen Reactions  . Pantoprazole Sodium     Current Outpatient Prescriptions on File Prior to Visit  Medication Sig Dispense Refill  . chlorpheniramine-HYDROcodone (TUSSIONEX PENNKINETIC ER) 10-8 MG/5ML LQCR Take 5 mLs by mouth every 12 (twelve) hours as needed.  140 mL  0  . esomeprazole (NEXIUM) 40 MG capsule Take 1 capsule (40 mg total) by mouth daily before breakfast.  90 capsule  3  . estrogen, conjugated,-medroxyprogesterone (PREMPRO) 0.45-1.5 MG per tablet Take 1 tablet by mouth daily.  90 tablet  3  . HYDROcodone-acetaminophen (NORCO) 10-325 MG  per tablet TAKE 1 TABLET BY MOUTH EVERY 6 HOURS AS NEEDED FOR PAIN  30 tablet  1  . Hydrocodone-Acetaminophen 10-660 MG TABS Take 10 mg by mouth as needed.  30 each  1  . Multiple Vitamin (MULTIVITAMIN) capsule Take 1 capsule by mouth daily.        [name] telmisartan (MICARDIS) 80 MG tablet Take 1 tablet (80 mg total) by mouth daily.  90 tablet  3  . nitrofurantoin, macrocrystal-monohydrate, (MACROBID) 100 MG capsule Take 1 capsule (100 mg total) by mouth 2 (two) times daily.  28 capsule  0  . sulfamethoxazole-trimethoprim (BACTRIM DS,SEPTRA DS) 800-160 MG per tablet Take 1 tablet by mouth 2 (two) times daily.  20 tablet  0    BP 130/80  Pulse 84  Temp 98.3 F (36.8 C) (Oral)  Resp 20  SpO2 97%        Review of Systems  Constitutional: Positive for fatigue.  HENT: Positive for congestion and sore throat. Negative for hearing loss, rhinorrhea, dental problem, sinus pressure and tinnitus.   Eyes: Negative for pain, discharge and visual disturbance.  Respiratory: Positive for cough. Negative for shortness of breath.   Cardiovascular: Negative for chest pain, palpitations and leg swelling.  Gastrointestinal:  Negative for nausea, vomiting, abdominal pain, diarrhea, constipation, blood in stool and abdominal distention.  Genitourinary: Negative for dysuria, urgency, frequency, hematuria, flank pain, vaginal bleeding, vaginal discharge, difficulty urinating, vaginal pain and pelvic pain.  Musculoskeletal: Negative for joint swelling, arthralgias and gait problem.  Skin: Negative for rash.  Neurological: Negative for dizziness, syncope, speech difficulty, weakness, numbness and headaches.  Hematological: Negative for adenopathy.  Psychiatric/Behavioral: Negative for behavioral problems, dysphoric mood and agitation. The patient is not nervous/anxious.        Objective:   Physical Exam  Constitutional: She is oriented to person, place, and time. She appears well-developed and well-nourished.   HENT:  Head: Normocephalic.  Right Ear: External ear normal.  Left Ear: External ear normal.  Mouth/Throat: Oropharynx is clear and moist.  Eyes: Conjunctivae normal and EOM are normal. Pupils are equal, round, and reactive to light.  Neck: Normal range of motion. Neck supple. No thyromegaly present.  Cardiovascular: Normal rate, regular rhythm, normal heart sounds and intact distal pulses.   Pulmonary/Chest: Effort normal and breath sounds normal.  Abdominal: Soft. Bowel sounds are normal. She exhibits no mass. There is no tenderness.  Musculoskeletal: Normal range of motion.  Lymphadenopathy:    She has no cervical adenopathy.  Neurological: She is alert and oriented to person, place, and time.  Skin: Skin is warm and dry. No rash noted.  Psychiatric: She has a normal mood and affect. Her behavior is normal.          Assessment & Plan:   Viral URI with cough. Patient has been to with Tussionex in the past will refill

## 2012-04-18 ENCOUNTER — Ambulatory Visit (INDEPENDENT_AMBULATORY_CARE_PROVIDER_SITE_OTHER): Payer: BC Managed Care – PPO | Admitting: Family Medicine

## 2012-04-18 ENCOUNTER — Encounter: Payer: Self-pay | Admitting: Family Medicine

## 2012-04-18 VITALS — BP 132/80 | HR 101 | Temp 100.4°F | Wt 197.0 lb

## 2012-04-18 DIAGNOSIS — J069 Acute upper respiratory infection, unspecified: Secondary | ICD-10-CM

## 2012-04-18 DIAGNOSIS — J111 Influenza due to unidentified influenza virus with other respiratory manifestations: Secondary | ICD-10-CM

## 2012-04-18 MED ORDER — HYDROCODONE-HOMATROPINE 5-1.5 MG/5ML PO SYRP: 5.0000 mL | ORAL_SOLUTION | Freq: Three times a day (TID) | ORAL | Status: DC | PRN

## 2012-04-18 MED ORDER — AZITHROMYCIN 250 MG PO TABS: ORAL_TABLET | ORAL | Status: DC

## 2012-04-18 NOTE — Patient Instructions (Addendum)

## 2012-04-18 NOTE — Progress Notes (Addendum)
Chief Complaint  Patient presents with  . Cough    congestion, body ahces, fever of 103, per patient hurts all over, headache, ear pain     HPI:  Acute Visit for ? Bronchitis: -started: upper resp inf in December then got better symptoms improved but cough lingered, symptoms started again yesterday -symptoms:nasal congestion, sore throat, cough, drainage, max sinus pain, ears hurt, HA, body aches, fever up to 103 last night -denies:SOB, NVD, tooth pain -has tried:  Advil, Nyquil -sick contacts: daughter and grandaughter sick recently -did not have flu shot -Hx of: no history of DM or chronic lung disease    ROS: See pertinent positives and negatives per HPI.  Past Medical History  Diagnosis Date  . Low back pain   . GERD (gastroesophageal reflux disease)   . Hypertension   . Osteoarthritis     Family History  Problem Relation Age of Onset  . COPD Father     died at age 69  . Heart disease Brother     stent with 90%  . COPD Mother   . Heart disease Mother     History   Social History  . Marital Status: Married    Spouse Name: N/A    Number of Children: N/A  . Years of Education: N/A   Social History Main Topics  . Smoking status: Never Smoker   . Smokeless tobacco: None  . Alcohol Use: Yes  . Drug Use: No  . Sexually Active: Yes   Other Topics Concern  . None   Social History Narrative  . None    Current outpatient prescriptions:chlorpheniramine-HYDROcodone (TUSSIONEX PENNKINETIC ER) 10-8 MG/5ML LQCR, Take 5 mLs by mouth every 12 (twelve) hours as needed., Disp: 140 mL, Rfl: 0;  esomeprazole (NEXIUM) 40 MG capsule, Take 1 capsule (40 mg total) by mouth daily before breakfast., Disp: 90 capsule, Rfl: 3;  estrogen, conjugated,-medroxyprogesterone (PREMPRO) 0.45-1.5 MG per tablet, Take 1 tablet by mouth daily., Disp: 90 tablet, Rfl: 3 HYDROcodone-acetaminophen (NORCO) 10-325 MG per tablet, TAKE 1 TABLET BY MOUTH EVERY 6 HOURS AS NEEDED FOR PAIN, Disp: 30  tablet, Rfl: 1;  Hydrocodone-Acetaminophen 10-660 MG TABS, Take 10 mg by mouth as needed., Disp: 30 each, Rfl: 1;  Multiple Vitamin (MULTIVITAMIN) capsule, Take 1 capsule by mouth daily.  , Disp: , Rfl:  nitrofurantoin, macrocrystal-monohydrate, (MACROBID) 100 MG capsule, Take 1 capsule (100 mg total) by mouth 2 (two) times daily., Disp: 28 capsule, Rfl: 0;  sulfamethoxazole-trimethoprim (BACTRIM DS,SEPTRA DS) 800-160 MG per tablet, Take 1 tablet by mouth 2 (two) times daily., Disp: 20 tablet, Rfl: 0;  telmisartan (MICARDIS) 80 MG tablet, Take 1 tablet (80 mg total) by mouth daily., Disp: 90 tablet, Rfl: 3 azithromycin (ZITHROMAX) 250 MG tablet, 2 tabs on first day and one tab daily after that, Disp: 6 tablet, Rfl: 0;  HYDROcodone-homatropine (HYCODAN) 5-1.5 MG/5ML syrup, Take 5 mLs by mouth every 8 (eight) hours as needed for cough., Disp: 120 mL, Rfl: 0  EXAM:  Filed Vitals:   04/18/12 1325  BP: 132/80  Pulse: 101  Temp: 100.4 F (38 C)    Body mass index is 34.91 kg/(m^2).  GENERAL: vitals reviewed and listed above, alert, oriented, appears well hydrated and in no acute distress  HEENT: atraumatic, conjunttiva clear, no obvious abnormalities on inspection of external nose and ears, normal appearance of ear canals and TMs, lots of clear nasal congestion, mild post oropharyngeal erythema with PND, no tonsillar edema or exudate, no sinus TTP  NECK:  no obvious masses on inspection  LUNGS: clear to auscultation bilaterally, no wheezes, rales or rhonchi, good air movement  CV: HRRR, no peripheral edema  MS: moves all extremities without noticeable abnormality  PSYCH: pleasant and cooperative, no obvious depression or anxiety  ASSESSMENT AND PLAN:  Discussed the following assessment and plan:  Influenza-like illness  Acute upper respiratory infections of unspecified site - Plan: azithromycin (ZITHROMAX) 250 MG tablet, HYDROcodone-homatropine (HYCODAN) 5-1.5 MG/5ML syrup  -likley  influenza like illness - discussed risks/benefits of tamiflu and pt prefers not to take this, not high risk and not very ill so this is reasonable -advised given lingering cough and sinus pain abx may be indicated in case of bacterial infection - pt would like to do azithromycin - risks disussed -cough medication given - risks discussed -advised supportive care and return precuations -Patient advised to return or notify a doctor immediately if symptoms worsen or persist or new concerns arise.  Patient Instructions  INSTRUCTIONS FOR UPPER RESPIRATORY INFECTION:  -plenty of rest and fluids  -nasal saline wash 2-3 times daily (use prepackaged nasal saline or bottled/distilled water if making your own)   -can use sinex nasal spray for drainage and nasal congestion - but do NOT use longer then 3-4 days  -can use tylenol or ibuprofen as directed for aches and sorethroat  -in the winter time, using a humidifier at night is helpful (please follow cleaning instructions)  -if you are taking a cough medication - use only as directed, may also try a teaspoon of honey to coat the throat and throat lozenges  -for sore throat, salt water gargles can help  -follow up if you have fevers, facial pain, tooth pain, difficulty breathing or are worsening or not getting better in 5-7 days      Arlette Schaad R.

## 2012-04-23 ENCOUNTER — Encounter: Payer: Self-pay | Admitting: Family Medicine

## 2012-04-23 ENCOUNTER — Ambulatory Visit (INDEPENDENT_AMBULATORY_CARE_PROVIDER_SITE_OTHER): Payer: BC Managed Care – PPO | Admitting: Family Medicine

## 2012-04-23 VITALS — BP 124/80 | HR 73 | Temp 98.3°F | Wt 200.0 lb

## 2012-04-23 DIAGNOSIS — J209 Acute bronchitis, unspecified: Secondary | ICD-10-CM

## 2012-04-23 DIAGNOSIS — N760 Acute vaginitis: Secondary | ICD-10-CM

## 2012-04-23 MED ORDER — BENZONATATE 100 MG PO CAPS: 100.0000 mg | ORAL_CAPSULE | Freq: Three times a day (TID) | ORAL | Status: DC | PRN

## 2012-04-23 MED ORDER — FLUCONAZOLE 150 MG PO TABS: 150.0000 mg | ORAL_TABLET | Freq: Once | ORAL | Status: DC

## 2012-04-23 MED ORDER — ALBUTEROL SULFATE HFA 108 (90 BASE) MCG/ACT IN AERS: 2.0000 | INHALATION_SPRAY | Freq: Four times a day (QID) | RESPIRATORY_TRACT | Status: DC | PRN

## 2012-04-23 NOTE — Progress Notes (Signed)
Chief Complaint  Patient presents with  . follow up bronchitis    HPI:  Acute visit for "follow up bronchitis": -seen on 2/21 with cough, nasal congestion, body aches and fevers and thought to have influenza like illness and given zpack in case of bacterial illness -reports: doing much better, fevers resolved, SOB much improved  - but has continued coughing and when talking all day with work coughing is bad -can't take the codeine during the day as makes her drowsy -denies: SOB, wheezing, fevers, chills  Yeast infection: -from abx - always gets this -vulvovaginal pruritis - OTC products dont' work  ROS: See pertinent positives and negatives per HPI.  Past Medical History  Diagnosis Date  . Low back pain   . GERD (gastroesophageal reflux disease)   . Hypertension   . Osteoarthritis     Family History  Problem Relation Age of Onset  . COPD Father     died at age 78  . Heart disease Brother     stent with 90%  . COPD Mother   . Heart disease Mother     History   Social History  . Marital Status: Married    Spouse Name: N/A    Number of Children: N/A  . Years of Education: N/A   Social History Main Topics  . Smoking status: Never Smoker   . Smokeless tobacco: None  . Alcohol Use: Yes  . Drug Use: No  . Sexually Active: Yes   Other Topics Concern  . None   Social History Narrative  . None    Current outpatient prescriptions:azithromycin (ZITHROMAX) 250 MG tablet, 2 tabs on first day and one tab daily after that, Disp: 6 tablet, Rfl: 0;  chlorpheniramine-HYDROcodone (TUSSIONEX PENNKINETIC ER) 10-8 MG/5ML LQCR, Take 5 mLs by mouth every 12 (twelve) hours as needed., Disp: 140 mL, Rfl: 0;  esomeprazole (NEXIUM) 40 MG capsule, Take 1 capsule (40 mg total) by mouth daily before breakfast., Disp: 90 capsule, Rfl: 3 estrogen, conjugated,-medroxyprogesterone (PREMPRO) 0.45-1.5 MG per tablet, Take 1 tablet by mouth daily., Disp: 90 tablet, Rfl: 3;   HYDROcodone-acetaminophen (NORCO) 10-325 MG per tablet, TAKE 1 TABLET BY MOUTH EVERY 6 HOURS AS NEEDED FOR PAIN, Disp: 30 tablet, Rfl: 1;  Hydrocodone-Acetaminophen 10-660 MG TABS, Take 10 mg by mouth as needed., Disp: 30 each, Rfl: 1 HYDROcodone-homatropine (HYCODAN) 5-1.5 MG/5ML syrup, Take 5 mLs by mouth every 8 (eight) hours as needed for cough., Disp: 120 mL, Rfl: 0;  Multiple Vitamin (MULTIVITAMIN) capsule, Take 1 capsule by mouth daily.  , Disp: , Rfl: ;  nitrofurantoin, macrocrystal-monohydrate, (MACROBID) 100 MG capsule, Take 1 capsule (100 mg total) by mouth 2 (two) times daily., Disp: 28 capsule, Rfl: 0 sulfamethoxazole-trimethoprim (BACTRIM DS,SEPTRA DS) 800-160 MG per tablet, Take 1 tablet by mouth 2 (two) times daily., Disp: 20 tablet, Rfl: 0;  telmisartan (MICARDIS) 80 MG tablet, Take 1 tablet (80 mg total) by mouth daily., Disp: 90 tablet, Rfl: 3;  albuterol (PROVENTIL HFA;VENTOLIN HFA) 108 (90 BASE) MCG/ACT inhaler, Inhale 2 puffs into the lungs every 6 (six) hours as needed for wheezing., Disp: 1 Inhaler, Rfl: 0 benzonatate (TESSALON PERLES) 100 MG capsule, Take 1 capsule (100 mg total) by mouth 3 (three) times daily as needed for cough., Disp: 20 capsule, Rfl: 0;  fluconazole (DIFLUCAN) 150 MG tablet, Take 1 tablet (150 mg total) by mouth once., Disp: 1 tablet, Rfl: 0  EXAM:  Filed Vitals:   04/23/12 1004  BP: 124/80  Pulse: 73  Temp:  98.3 F (36.8 C)    Body mass index is 35.44 kg/(m^2).  GENERAL: vitals reviewed and listed above, alert, oriented, appears well hydrated and in no acute distress  HEENT: atraumatic, conjunttiva clear, no obvious abnormalities on inspection of external nose and ears  NECK: no obvious masses on inspection  LUNGS: clear to auscultation bilaterally, no wheezes, rales or rhonchi, good air movement  CV: HRRR, no peripheral edema  MS: moves all extremities without noticeable abnormality  PSYCH: pleasant and cooperative, no obvious depression or  anxiety  ASSESSMENT AND PLAN:  Discussed the following assessment and plan:  Acute bronchitis - Plan: albuterol (PROVENTIL HFA;VENTOLIN HFA) 108 (90 BASE) MCG/ACT inhaler, benzonatate (TESSALON PERLES) 100 MG capsule  Vaginitis and vulvovaginitis - Plan: fluconazole (DIFLUCAN) 150 MG tablet  -much improved and suspect post infectious bronchial inflammation. tx per orders and instrutction - risks for meds discussed. -tx for yeast infection with diflucan -Patient advised to return or notify a doctor immediately if symptoms worsen or persist or new concerns arise.  There are no Patient Instructions on file for this visit.   Kriste Basque R.

## 2012-04-23 NOTE — Patient Instructions (Addendum)
-  use menthol cough drops, tessalon perles for the cough during the day  -can try albuterol for the cough to see if this helps  -take the diflucan for the yeast infection  -follow up as needed

## 2012-04-30 ENCOUNTER — Ambulatory Visit: Payer: BC Managed Care – PPO | Admitting: Internal Medicine

## 2012-05-14 DIAGNOSIS — Z96649 Presence of unspecified artificial hip joint: Secondary | ICD-10-CM | POA: Insufficient documentation

## 2012-05-14 HISTORY — DX: Presence of unspecified artificial hip joint: Z96.649

## 2012-06-09 ENCOUNTER — Telehealth: Payer: Self-pay | Admitting: Internal Medicine

## 2012-06-09 DIAGNOSIS — R053 Chronic cough: Secondary | ICD-10-CM

## 2012-06-09 DIAGNOSIS — R05 Cough: Secondary | ICD-10-CM

## 2012-06-09 NOTE — Telephone Encounter (Signed)
Per dr Lovell Sheehan- she needs referral to pulmonary

## 2012-06-09 NOTE — Telephone Encounter (Signed)
Refer to pulmonary

## 2012-06-09 NOTE — Telephone Encounter (Signed)
Pt stating that she has been sick since December, and has shown no signs of getting better since seeing Dr. Selena Batten. Pt states that she is needs to her her doctor. Please assist.

## 2012-06-09 NOTE — Telephone Encounter (Signed)
States she has had bromchitis with a bough for 4 months and nothing is helping- she has started stating allergy meds now and they are not helping.  The pnd drainage that she has is clear. Please advise. She has been unable to see dr Lovell Sheehan and wants to see him-

## 2012-06-26 ENCOUNTER — Encounter: Payer: Self-pay | Admitting: Emergency Medicine

## 2012-06-26 ENCOUNTER — Ambulatory Visit (INDEPENDENT_AMBULATORY_CARE_PROVIDER_SITE_OTHER): Payer: BC Managed Care – PPO | Admitting: Emergency Medicine

## 2012-06-26 VITALS — BP 130/84 | HR 79 | Temp 98.4°F | Ht 63.0 in | Wt 202.6 lb

## 2012-06-26 DIAGNOSIS — R05 Cough: Secondary | ICD-10-CM

## 2012-06-26 DIAGNOSIS — J209 Acute bronchitis, unspecified: Secondary | ICD-10-CM

## 2012-06-26 DIAGNOSIS — R059 Cough, unspecified: Secondary | ICD-10-CM

## 2012-06-26 MED ORDER — LORATADINE 10 MG PO TABS: 10.0000 mg | ORAL_TABLET | Freq: Every day | ORAL | Status: DC

## 2012-06-26 MED ORDER — HYDROCOD POLST-CHLORPHEN POLST 10-8 MG/5ML PO LQCR: 5.0000 mL | Freq: Two times a day (BID) | ORAL | Status: DC

## 2012-06-26 MED ORDER — BENZONATATE 100 MG PO CAPS: 100.0000 mg | ORAL_CAPSULE | Freq: Three times a day (TID) | ORAL | Status: DC | PRN

## 2012-06-26 NOTE — Patient Instructions (Addendum)
We will increase nexium to twice a day for 2 weeks, then back to daily Start loratadine 10mg  daily Start nasonex 2 sprays each nostril daily We will use tussionex 1tsp twice a day and tessalon perles for cough suppression You will need to practice voice rest, avoid cough and throat clearing (just swallow instead). Try using sugar free candies instead of menthol or mint drops.  Follow with Dr Delton Coombes in 1 month

## 2012-06-26 NOTE — Progress Notes (Signed)
Subjective:    Patient ID: Brandy Dunlap, female    DOB: June 25, 1950, 62 y.o.   MRN: [id number]  HPI 62 yo never smoker, hx of HTN, GERD, OA, "sinus trouble".  She is referred for chronic cough. States that she caught URI 12/13, all of the cold sx improved, then she caught the flu. She has kept a dry cough remained and has persisted. She does get some breaks for a few days but then it returns. She has been tried on tessalon, cough syrup + codeine. The cough happens all day, non-productive.   She was formerly on allergy shots but stopped these years ago.  She was changed from brand name micardis to generic telmisartan   Review of Systems  Constitutional: Negative for fever and unexpected weight change.  HENT: Positive for ear pain and sneezing. Negative for nosebleeds, congestion, sore throat, rhinorrhea, trouble swallowing, dental problem, postnasal drip and sinus pressure.   Eyes: Negative for redness and itching.  Respiratory: Positive for cough. Negative for chest tightness, shortness of breath and wheezing.   Cardiovascular: Negative for palpitations and leg swelling.  Gastrointestinal: Negative for nausea and vomiting.  Genitourinary: Negative for dysuria.  Musculoskeletal: Negative for joint swelling.  Skin: Negative for rash.  Neurological: Negative for headaches.  Hematological: Does not bruise/bleed easily.  Psychiatric/Behavioral: Negative for dysphoric mood. The patient is not nervous/anxious.     Past Medical History  Diagnosis Date  . Low back pain   . GERD (gastroesophageal reflux disease)   . Hypertension   . Osteoarthritis   . Sinus trouble      Family History  Problem Relation Age of Onset  . COPD Father     died at age 26  . Heart disease Brother     stent with 90%  . COPD Mother   . Heart disease Mother   . Cancer Mother     ovarian  . Emphysema Father   . Emphysema Mother      History   Social History  . Marital Status: Married    Spouse Name:  N/A    Number of Children: N/A  . Years of Education: N/A   Occupational History  . Not on file.   Social History Main Topics  . Smoking status: Never Smoker   . Smokeless tobacco: Never Used  . Alcohol Use: Yes  . Drug Use: No  . Sexually Active: Yes   Other Topics Concern  . Not on file   Social History Narrative  . No narrative on file     Allergies  Allergen Reactions  . Pantoprazole Sodium      Outpatient Prescriptions Prior to Visit  Medication Sig Dispense Refill  . albuterol (PROVENTIL HFA;VENTOLIN HFA) 108 (90 BASE) MCG/ACT inhaler Inhale 2 puffs into the lungs every 6 (six) hours as needed for wheezing.  1 Inhaler  0  . azithromycin (ZITHROMAX) 250 MG tablet 2 tabs on first day and one tab daily after that  6 tablet  0  . esomeprazole (NEXIUM) 40 MG capsule Take 1 capsule (40 mg total) by mouth daily before breakfast.  90 capsule  3  . estrogen, conjugated,-medroxyprogesterone (PREMPRO) 0.45-1.5 MG per tablet Take 1 tablet by mouth daily.  90 tablet  3  . HYDROcodone-acetaminophen (NORCO) 10-325 MG per tablet TAKE 1 TABLET BY MOUTH EVERY 6 HOURS AS NEEDED FOR PAIN  30 tablet  1  . HYDROcodone-homatropine (HYCODAN) 5-1.5 MG/5ML syrup Take 5 mLs by mouth every 8 (eight) hours as  needed for cough.  120 mL  0  . Multiple Vitamin (MULTIVITAMIN) capsule Take 1 capsule by mouth daily.        [name] sulfamethoxazole-trimethoprim (BACTRIM DS,SEPTRA DS) 800-160 MG per tablet Take 1 tablet by mouth 2 (two) times daily.  20 tablet  0  . telmisartan (MICARDIS) 80 MG tablet Take 1 tablet (80 mg total) by mouth daily.  90 tablet  3  . benzonatate (TESSALON PERLES) 100 MG capsule Take 1 capsule (100 mg total) by mouth 3 (three) times daily as needed for cough.  20 capsule  0  . chlorpheniramine-HYDROcodone (TUSSIONEX PENNKINETIC ER) 10-8 MG/5ML LQCR Take 5 mLs by mouth every 12 (twelve) hours as needed.  140 mL  0  . fluconazole (DIFLUCAN) 150 MG tablet Take 1 tablet (150 mg total) by  mouth once.  1 tablet  0  . Hydrocodone-Acetaminophen 10-660 MG TABS Take 10 mg by mouth as needed.  30 each  1  . nitrofurantoin, macrocrystal-monohydrate, (MACROBID) 100 MG capsule Take 1 capsule (100 mg total) by mouth 2 (two) times daily.  28 capsule  0   No facility-administered medications prior to visit.         Objective:   Physical Exam Filed Vitals:   06/26/12 1541  BP: 130/84  Pulse: 79  Temp: 98.4 F (36.9 C)  TempSrc: Oral  Height: 5\' 3"  (1.6 m)  Weight: 202 lb 9.6 oz (91.899 kg)  SpO2: 94%   Gen: Pleasant, well-nourished, in no distress,  normal affect  ENT: No lesions,  mouth clear,  oropharynx clear, no postnasal drip  Neck: No JVD, no TMG, no carotid bruits  Lungs: No use of accessory muscles, no dullness to percussion, clear without rales or rhonchi  Cardiovascular: RRR, heart sounds normal, no murmur or gallops, no peripheral edema  Musculoskeletal: No deformities, no cyanosis or clubbing  Neuro: alert, non focal  Skin: Warm, no lesions or rashes     Assessment & Plan:  Cough Cyclical cough protocol with focus on ramp up rx for allergies and GERD, both of which she has - loratadine - nasonex - increase nexium to bid x 2 weeks then back down to qd - voice rest - ov 1 month

## 2012-06-26 NOTE — Assessment & Plan Note (Signed)
Cyclical cough protocol with focus on ramp up rx for allergies and GERD, both of which she has - loratadine - nasonex - increase nexium to bid x 2 weeks then back down to qd - voice rest - ov 1 month

## 2012-07-25 ENCOUNTER — Other Ambulatory Visit: Payer: Self-pay | Admitting: Internal Medicine

## 2012-07-29 ENCOUNTER — Ambulatory Visit: Payer: BC Managed Care – PPO | Admitting: Emergency Medicine

## 2012-08-05 ENCOUNTER — Encounter: Payer: Self-pay | Admitting: Family

## 2012-08-05 ENCOUNTER — Ambulatory Visit (INDEPENDENT_AMBULATORY_CARE_PROVIDER_SITE_OTHER): Payer: BC Managed Care – PPO | Admitting: Family

## 2012-08-05 VITALS — BP 126/84 | HR 83 | Wt 202.0 lb

## 2012-08-05 DIAGNOSIS — N390 Urinary tract infection, site not specified: Secondary | ICD-10-CM

## 2012-08-05 DIAGNOSIS — R350 Frequency of micturition: Secondary | ICD-10-CM

## 2012-08-05 LAB — POCT URINALYSIS DIPSTICK
Bilirubin, UA: NEGATIVE
Glucose, UA: NEGATIVE
Ketones, UA: NEGATIVE
Leukocytes, UA: NEGATIVE
Nitrite, UA: NEGATIVE
Protein, UA: NEGATIVE
Spec Grav, UA: 1.025
Urobilinogen, UA: 0.2
pH, UA: 6

## 2012-08-05 MED ORDER — SULFAMETHOXAZOLE-TRIMETHOPRIM 800-160 MG PO TABS: 1.0000 | ORAL_TABLET | Freq: Two times a day (BID) | ORAL | Status: AC

## 2012-08-05 NOTE — Progress Notes (Signed)
Subjective:    Patient ID: Brandy Dunlap, female    DOB: 1950/05/27, 62 y.o.   MRN: [id number]  HPI 62 year old white female presents to PCP for burning, pressure and urgency with urination. States symptoms have been present x 5 days. Denies any vaginal discharge or odor. States symptoms feel like "previous UTIs". Denies any aggravating or relieving factors or current treatments.    Review of Systems  Constitutional: Negative.   HENT: Negative.   Eyes: Negative.   Respiratory: Negative.   Cardiovascular: Negative.   Gastrointestinal: Negative.   Endocrine: Negative.   Genitourinary: Positive for dysuria and urgency.  Musculoskeletal: Negative.   Skin: Negative.   Allergic/Immunologic: Negative.   Neurological: Negative.   Hematological: Negative.   Psychiatric/Behavioral: Negative.    Past Medical History  Diagnosis Date  . Low back pain   . GERD (gastroesophageal reflux disease)   . Hypertension   . Osteoarthritis   . Sinus trouble     History   Social History  . Marital Status: Married    Spouse Name: N/A    Number of Children: N/A  . Years of Education: N/A   Occupational History  . Not on file.   Social History Main Topics  . Smoking status: Never Smoker   . Smokeless tobacco: Never Used  . Alcohol Use: Yes  . Drug Use: No  . Sexually Active: Yes   Other Topics Concern  . Not on file   Social History Narrative  . No narrative on file    Past Surgical History  Procedure Laterality Date  . Spine surgery      lumbar fusion  . Cholecystectomy    . Tonsillectomy    . Partial hip arthroplasty      right hip replacement    Family History  Problem Relation Age of Onset  . COPD Father     died at age 16  . Heart disease Brother     stent with 90%  . COPD Mother   . Heart disease Mother   . Cancer Mother     ovarian  . Emphysema Father   . Emphysema Mother     Allergies  Allergen Reactions  . Pantoprazole Sodium     Current Outpatient  Prescriptions on File Prior to Visit  Medication Sig Dispense Refill  . albuterol (PROVENTIL HFA;VENTOLIN HFA) 108 (90 BASE) MCG/ACT inhaler Inhale 2 puffs into the lungs every 6 (six) hours as needed for wheezing.  1 Inhaler  0  . estrogen, conjugated,-medroxyprogesterone (PREMPRO) 0.45-1.5 MG per tablet Take 1 tablet by mouth daily.  90 tablet  3  . Multiple Vitamin (MULTIVITAMIN) capsule Take 1 capsule by mouth daily.        [name] NEXIUM 40 MG capsule TAKE 1 CAPSULE DAILY BEFOREBREAKFAST  90 capsule  3  . telmisartan (MICARDIS) 80 MG tablet Take 1 tablet (80 mg total) by mouth daily.  90 tablet  3  . azithromycin (ZITHROMAX) 250 MG tablet 2 tabs on first day and one tab daily after that  6 tablet  0  . benzonatate (TESSALON PERLES) 100 MG capsule Take 1 capsule (100 mg total) by mouth 3 (three) times daily as needed for cough.  20 capsule  0  . chlorpheniramine-HYDROcodone (TUSSIONEX) 10-8 MG/5ML LQCR Take 5 mLs by mouth every 12 (twelve) hours.  140 mL  0  . HYDROcodone-acetaminophen (NORCO) 10-325 MG per tablet TAKE 1 TABLET BY MOUTH EVERY 6 HOURS AS NEEDED FOR PAIN  30  tablet  1  . loratadine (CLARITIN) 10 MG tablet Take 1 tablet (10 mg total) by mouth daily.  30 tablet  3   No current facility-administered medications on file prior to visit.    BP 126/84  Pulse 83  Wt 202 lb (91.627 kg)  BMI 35.79 kg/m2  SpO2 98%chart    Objective:   Physical Exam  Constitutional: She is oriented to person, place, and time. She appears well-developed and well-nourished.  HENT:  Head: Normocephalic and atraumatic.  Eyes: Pupils are equal, round, and reactive to light.  Neck: Normal range of motion.  Cardiovascular: Normal rate, regular rhythm and normal heart sounds.   Pulmonary/Chest: Effort normal and breath sounds normal.  Abdominal: Soft. Bowel sounds are normal.  Musculoskeletal: Normal range of motion.  Neurological: She is alert and oriented to person, place, and time.  Skin: Skin is warm  and dry.          Assessment & Plan:  1. Urinary Tract Infection  Pt prescribed Bactrim for UTI. Educated on avoidance of aggravating factors. Instructed to return to PCP if sx worsen or persist.

## 2012-08-05 NOTE — Patient Instructions (Signed)
Urinary Tract Infection  Urinary tract infections (UTIs) can develop anywhere along your urinary tract. Your urinary tract is your body's drainage system for removing wastes and extra water. Your urinary tract includes two kidneys, two ureters, a bladder, and a urethra. Your kidneys are a pair of bean-shaped organs. Each kidney is about the size of your fist. They are located below your ribs, one on each side of your spine.  CAUSES  Infections are caused by microbes, which are microscopic organisms, including fungi, viruses, and bacteria. These organisms are so small that they can only be seen through a microscope. Bacteria are the microbes that most commonly cause UTIs.  SYMPTOMS   Symptoms of UTIs may vary by age and gender of the patient and by the location of the infection. Symptoms in young women typically include a frequent and intense urge to urinate and a painful, burning feeling in the bladder or urethra during urination. Older women and men are more likely to be tired, shaky, and weak and have muscle aches and abdominal pain. A fever may mean the infection is in your kidneys. Other symptoms of a kidney infection include pain in your back or sides below the ribs, nausea, and vomiting.  DIAGNOSIS  To diagnose a UTI, your caregiver will ask you about your symptoms. Your caregiver also will ask to provide a urine sample. The urine sample will be tested for bacteria and white blood cells. White blood cells are made by your body to help fight infection.  TREATMENT   Typically, UTIs can be treated with medication. Because most UTIs are caused by a bacterial infection, they usually can be treated with the use of antibiotics. The choice of antibiotic and length of treatment depend on your symptoms and the type of bacteria causing your infection.  HOME CARE INSTRUCTIONS   If you were prescribed antibiotics, take them exactly as your caregiver instructs you. Finish the medication even if you feel better after you  have only taken some of the medication.   Drink enough water and fluids to keep your urine clear or pale yellow.   Avoid caffeine, tea, and carbonated beverages. They tend to irritate your bladder.   Empty your bladder often. Avoid holding urine for long periods of time.   Empty your bladder before and after sexual intercourse.   After a bowel movement, women should cleanse from front to back. Use each tissue only once.  SEEK MEDICAL CARE IF:    You have back pain.   You develop a fever.   Your symptoms do not begin to resolve within 3 days.  SEEK IMMEDIATE MEDICAL CARE IF:    You have severe back pain or lower abdominal pain.   You develop chills.   You have nausea or vomiting.   You have continued burning or discomfort with urination.  MAKE SURE YOU:    Understand these instructions.   Will watch your condition.   Will get help right away if you are not doing well or get worse.  Document Released: 11/22/2004 Document Revised: 08/14/2011 Document Reviewed: 03/23/2011  [employer] Patient Information 2014 [employer]

## 2012-08-07 LAB — URINE CULTURE: Colony Count: 8000

## 2012-08-08 ENCOUNTER — Ambulatory Visit (INDEPENDENT_AMBULATORY_CARE_PROVIDER_SITE_OTHER): Payer: BC Managed Care – PPO | Admitting: Emergency Medicine

## 2012-08-08 ENCOUNTER — Encounter: Payer: Self-pay | Admitting: Emergency Medicine

## 2012-08-08 ENCOUNTER — Encounter: Payer: Self-pay | Admitting: Internal Medicine

## 2012-08-08 VITALS — BP 130/72 | HR 81 | Temp 98.3°F | Ht 61.0 in | Wt 201.0 lb

## 2012-08-08 DIAGNOSIS — R059 Cough, unspecified: Secondary | ICD-10-CM

## 2012-08-08 DIAGNOSIS — R05 Cough: Secondary | ICD-10-CM

## 2012-08-08 NOTE — Patient Instructions (Addendum)
Please increase Nexium back to twice a day Continue loratadine and nasonex We will refer you to see gastroenterology Follow with Dr Delton Coombes if your breathing or cough worsen in any way

## 2012-08-08 NOTE — Assessment & Plan Note (Signed)
Suspect both GERD and rhinitis are factors. She feels she needs full dose PPI, probably should be seen by GI for further eval. Continue loratadine and nasonex.

## 2012-08-08 NOTE — Progress Notes (Signed)
  Subjective:    Patient ID: Brandy Dunlap, female    DOB: 10/28/1950, 62 y.o.   MRN: [id number]  HPI 62 yo never smoker, hx of HTN, GERD, OA, "sinus trouble".  She is referred for chronic cough. States that she caught URI 12/13, all of the cold sx improved, then she caught the flu. She has kept a dry cough remained and has persisted. She does get some breaks for a few days but then it returns. She has been tried on tessalon, cough syrup + codeine. The cough happens all day, non-productive.   She was formerly on allergy shots but stopped these years ago.  She was changed from brand name micardis to generic telmisartan   ROV 08/08/12 -- f/u for chronic cough. Last time we increased nexium temporarily, added loratadine and nasonex, voice rest. She may have benefited some, especially when she was on double Nexium.    Review of Systems  Constitutional: Negative for fever and unexpected weight change.  HENT: Negative for ear pain, nosebleeds, congestion, sore throat, rhinorrhea, sneezing, trouble swallowing, dental problem, postnasal drip and sinus pressure.   Eyes: Negative for redness and itching.  Respiratory: Positive for cough. Negative for chest tightness, shortness of breath and wheezing.   Cardiovascular: Negative for palpitations and leg swelling.  Gastrointestinal: Negative for nausea and vomiting.  Genitourinary: Negative for dysuria.  Musculoskeletal: Negative for joint swelling.  Skin: Negative for rash.  Neurological: Negative for headaches.  Hematological: Does not bruise/bleed easily.  Psychiatric/Behavioral: Negative for dysphoric mood. The patient is not nervous/anxious.       Objective:   Physical Exam Filed Vitals:   08/08/12 1517  BP: 130/72  Pulse: 81  Temp: 98.3 F (36.8 C)  TempSrc: Oral  Height: 5\' 1"  (1.549 m)  Weight: 201 lb (91.173 kg)  SpO2: 95%   Gen: Pleasant, well-nourished, in no distress,  normal affect  ENT: No lesions,  mouth clear,  oropharynx  clear, no postnasal drip  Neck: No JVD, no TMG, no carotid bruits  Lungs: No use of accessory muscles, clear without rales or rhonchi  Cardiovascular: RRR, heart sounds normal, no murmur or gallops, no peripheral edema  Musculoskeletal: No deformities, no cyanosis or clubbing  Neuro: alert, non focal  Skin: Warm, no lesions or rashes     Assessment & Plan:  Cough Suspect both GERD and rhinitis are factors. She feels she needs full dose PPI, probably should be seen by GI for further eval. Continue loratadine and nasonex.

## 2012-09-03 ENCOUNTER — Other Ambulatory Visit: Payer: Self-pay

## 2012-09-03 DIAGNOSIS — Z1231 Encounter for screening mammogram for malignant neoplasm of breast: Secondary | ICD-10-CM

## 2012-09-05 ENCOUNTER — Ambulatory Visit (INDEPENDENT_AMBULATORY_CARE_PROVIDER_SITE_OTHER): Payer: BC Managed Care – PPO | Admitting: Internal Medicine

## 2012-09-05 ENCOUNTER — Encounter: Payer: Self-pay | Admitting: Internal Medicine

## 2012-09-05 VITALS — BP 124/74 | HR 76 | Ht 62.25 in | Wt 203.1 lb

## 2012-09-05 DIAGNOSIS — R0989 Other specified symptoms and signs involving the circulatory and respiratory systems: Secondary | ICD-10-CM

## 2012-09-05 DIAGNOSIS — R198 Other specified symptoms and signs involving the digestive system and abdomen: Secondary | ICD-10-CM

## 2012-09-05 DIAGNOSIS — R05 Cough: Secondary | ICD-10-CM

## 2012-09-05 DIAGNOSIS — F458 Other somatoform disorders: Secondary | ICD-10-CM

## 2012-09-05 DIAGNOSIS — K219 Gastro-esophageal reflux disease without esophagitis: Secondary | ICD-10-CM

## 2012-09-05 DIAGNOSIS — R059 Cough, unspecified: Secondary | ICD-10-CM

## 2012-09-05 MED ORDER — DEXLANSOPRAZOLE 60 MG PO CPDR: 60.0000 mg | DELAYED_RELEASE_CAPSULE | Freq: Every day | ORAL | Status: DC

## 2012-09-05 NOTE — Progress Notes (Signed)
  Subjective:  Referred by: Leslye Peer, MD   Patient ID: Brandy Dunlap, female    DOB: 1950-05-31, 62 y.o.   MRN: [id number]  HPI The patient is here at the request of Dr. Delton Coombes regarding a cough. She developed an upper for infection and an influenza and earlier in the year. She has had a persistent but intermittent dry cough. She also has a globus sensation. She saw Dr. Delton Coombes and among other things he tried her on Nexium twice a day for a while and she said she felt somewhat better. He was wondering about the possibility of a reflux component to this. She had been on Nexium daily for many years with good control of typical reflux symptoms. She has taken some cough medicine, narcotic but has stopped that to Dr. Kavin Leech request. What she says now she'll have intermittent days of dry cough, particularly in the evening and in 90 though it doesn't seem to awaken her, and in the early morning. She has a lump sensation in her throat. She did have postnasal drip but is on medication for that with success. She denies dysphagia or odynophagia and she really does not get hoarse. At one point she did hold off on speaking for several days and I think she is better overall but no cough will not disappear. She does have her head of bed raised, there is only one caffeinated beverage a day at most and she is not a smoker. She has gained some weight. She notices that she is very busy she does not seem to have a cough.  Medications, allergies, past medical history, past surgical history, family history and social history are reviewed and updated in the EMR.    Review of Systems This is positive for back pain joint pain, fatigue and stress urinary incontinence. All other review of systems negative. Except as per history of present illness.    Objective:   Physical Exam General:  Pleasant, obese, Well-developed, well-nourished and in no acute distress Eyes:  anicteric. ENT:   Mouth and posterior pharynx free of  lesions.  Neck:   supple w/o thyromegaly or mass.  Lungs: Clear to auscultation bilaterally. Heart:  S1S2, no rubs, murmurs, gallops. Abdomen:  soft, non-tender, no hepatosplenomegaly, hernia, or mass and BS+.  Lymph:  no cervical or supraclavicular adenopathy. Extremities:   no edema Neuro:  A&O x 3.  Psych:  appropriate mood and  Affect.   Data Reviewed: Dr. Kavin Leech notes 12/07/1999 - EGD - small hiatal hernia 10/06/2003 colonoscopy mucosal polyp (not pre-cancerous)    Assessment & Plan:   1. Cough   2. Globus sensation   3. GERD (gastroesophageal reflux disease)      1. Change Nexium to Dexilant 60 mg daily. I doubt EGD would tell us anything new. 2. REV 2 months 3. Keep HOB up, GERD diet review (only on 1 soda/day max) 4. If not better on this regimen could consider pH testing - I have explained that the yield of endoscopy is extremely low in work-up of cough thought to be related to reflux.  Cc: Levy Pupa, MD

## 2012-09-05 NOTE — Patient Instructions (Addendum)
We have sent the following medications to your pharmacy for you to pick up at your convenience: Dexilant, coupon card given to patient.  Today you have been given a GERD handout to read and follow.  Follow up with [name] in 2 months.  I appreciate the opportunity to care for you.

## 2012-09-15 ENCOUNTER — Other Ambulatory Visit: Payer: Self-pay | Admitting: *Deleted

## 2012-09-15 ENCOUNTER — Telehealth: Payer: Self-pay | Admitting: Emergency Medicine

## 2012-09-15 MED ORDER — CONJ ESTROG-MEDROXYPROGEST ACE 0.45-1.5 MG PO TABS: 1.0000 | ORAL_TABLET | Freq: Every day | ORAL | Status: DC

## 2012-09-15 NOTE — Telephone Encounter (Signed)
Appointment Request From: Brandy Dunlap With Provider: Leslye Peer., MD [facility]]  Preferred Date Range: From 09/15/2012 To 09/25/2012 Preferred Times: Monday Morning, Thursday Morning, Tuesday Afternoon, Wednesday Afternoon, Friday Afternoon  Reason for visit:  Office Visit Comments: In the afternoon after 3 please.Marland KitchenMarland KitchenI am still having problems with cough and I now seem to be wheezing..Would like a chest xray just to be sure my lungs and air way are okay. Pls call and leave message on [phone] or cell [phone] thanks

## 2012-09-15 NOTE — Telephone Encounter (Signed)
done

## 2012-09-15 NOTE — Telephone Encounter (Signed)
Made the patient an appt for  09/18/12 at 11:00 this was the only opening for [place] per pt's request.

## 2012-09-18 ENCOUNTER — Ambulatory Visit (INDEPENDENT_AMBULATORY_CARE_PROVIDER_SITE_OTHER): Payer: BC Managed Care – PPO | Admitting: Emergency Medicine

## 2012-09-18 ENCOUNTER — Encounter: Payer: Self-pay | Admitting: Emergency Medicine

## 2012-09-18 VITALS — BP 116/80 | HR 67 | Temp 98.4°F | Ht 62.5 in | Wt 203.6 lb

## 2012-09-18 DIAGNOSIS — R059 Cough, unspecified: Secondary | ICD-10-CM

## 2012-09-18 DIAGNOSIS — R05 Cough: Secondary | ICD-10-CM

## 2012-09-18 NOTE — Patient Instructions (Addendum)
Please continue your loratadine, nasonex as you are taking them Restart nasal saline washes daily Try using an OTC decongestant that contains chlorpheniramine or brompheniramine as directed, especially at night Continue your Dexilant as directed by Dr Leone Payor Call to follow up with Dr Delton Coombes if you are still having symptoms.

## 2012-09-18 NOTE — Progress Notes (Signed)
  Subjective:    Patient ID: Brandy Dunlap, female    DOB: 1950/12/15, 62 y.o.   MRN: [id number]  HPI 62 yo never smoker, hx of HTN, GERD, OA, "sinus trouble".  She is referred for chronic cough. States that she caught URI 12/13, all of the cold sx improved, then she caught the flu. She has kept a dry cough remained and has persisted. She does get some breaks for a few days but then it returns. She has been tried on tessalon, cough syrup + codeine. The cough happens all day, non-productive.   She was formerly on allergy shots but stopped these years ago.  She was changed from brand name micardis to generic telmisartan   ROV 08/08/12 -- f/u for chronic cough. Last time we increased nexium temporarily, added loratadine and nasonex, voice rest. She may have benefited some, especially when she was on double Nexium.   ROV 09/18/12 -- hx chronic cough. She has seen GI, changed to dexilant 60mg , has been adjusting to this, having diarrhea. Reflux is better. Still w some nasal gtt, esp at night.    Review of Systems  Constitutional: Negative for fever and unexpected weight change.  HENT: Negative for ear pain, nosebleeds, congestion, sore throat, rhinorrhea, sneezing, trouble swallowing, dental problem, postnasal drip and sinus pressure.   Eyes: Negative for redness and itching.  Respiratory: Positive for cough. Negative for chest tightness, shortness of breath and wheezing.   Cardiovascular: Negative for palpitations and leg swelling.  Gastrointestinal: Negative for nausea and vomiting.  Genitourinary: Negative for dysuria.  Musculoskeletal: Negative for joint swelling.  Skin: Negative for rash.  Neurological: Negative for headaches.  Hematological: Does not bruise/bleed easily.  Psychiatric/Behavioral: Negative for dysphoric mood. The patient is not nervous/anxious.       Objective:   Physical Exam Filed Vitals:   09/18/12 1111  BP: 116/80  Pulse: 67  Temp: 98.4 F (36.9 C)  TempSrc:  Oral  Height: 5' 2.5" (1.588 m)  Weight: 203 lb 9.6 oz (92.352 kg)  SpO2: 97%   Gen: Pleasant, well-nourished, in no distress,  normal affect  ENT: No lesions,  mouth clear,  oropharynx clear, no postnasal drip  Neck: No JVD, no TMG, no carotid bruits  Lungs: No use of accessory muscles, clear without rales or rhonchi  Cardiovascular: RRR, heart sounds normal, no murmur or gallops, no peripheral edema  Musculoskeletal: No deformities, no cyanosis or clubbing  Neuro: alert, non focal  Skin: Warm, no lesions or rashes     Assessment & Plan:  Cough GERD seems to be treated. She still has some PND. If the cough or "rattling" in her chest continues then will work up further w PFT,  Please continue your loratadine, nasonex as you are taking them Restart nasal saline washes daily Try using an OTC decongestant that contains chlorpheniramine or brompheniramine as directed, especially at night Continue your Dexilant as directed by Dr Leone Payor Call to follow up with Dr Delton Coombes if you are still having symptoms.

## 2012-09-18 NOTE — Assessment & Plan Note (Signed)
GERD seems to be treated. She still has some PND. If the cough or "rattling" in her chest continues then will work up further w PFT,  Please continue your loratadine, nasonex as you are taking them Restart nasal saline washes daily Try using an OTC decongestant that contains chlorpheniramine or brompheniramine as directed, especially at night Continue your Dexilant as directed by Dr Leone Payor Call to follow up with Dr Delton Coombes if you are still having symptoms.

## 2012-09-26 ENCOUNTER — Other Ambulatory Visit (INDEPENDENT_AMBULATORY_CARE_PROVIDER_SITE_OTHER): Payer: BC Managed Care – PPO

## 2012-09-26 DIAGNOSIS — Z0000 Encounter for general adult medical examination without abnormal findings: Secondary | ICD-10-CM

## 2012-09-26 LAB — HEPATIC FUNCTION PANEL
ALT: 20 U/L (ref 0–35)
AST: 24 U/L (ref 0–37)
Albumin: 3.4 g/dL — ABNORMAL LOW (ref 3.5–5.2)
Alkaline Phosphatase: 38 U/L — ABNORMAL LOW (ref 39–117)
Bilirubin, Direct: 0.1 mg/dL (ref 0.0–0.3)
Total Bilirubin: 0.6 mg/dL (ref 0.3–1.2)
Total Protein: 6.6 g/dL (ref 6.0–8.3)

## 2012-09-26 LAB — CBC WITH DIFFERENTIAL/PLATELET
Basophils Absolute: 0 10*3/uL (ref 0.0–0.1)
Basophils Relative: 1.3 % (ref 0.0–3.0)
Eosinophils Absolute: 0.2 10*3/uL (ref 0.0–0.7)
Eosinophils Relative: 7.7 % — ABNORMAL HIGH (ref 0.0–5.0)
HCT: 36.8 % (ref 36.0–46.0)
Hemoglobin: 12.4 g/dL (ref 12.0–15.0)
Lymphocytes Relative: 55.6 % — ABNORMAL HIGH (ref 12.0–46.0)
Lymphs Abs: 1.4 10*3/uL (ref 0.7–4.0)
MCHC: 33.6 g/dL (ref 30.0–36.0)
MCV: 89.1 fl (ref 78.0–100.0)
Monocytes Absolute: 0.2 10*3/uL (ref 0.1–1.0)
Monocytes Relative: 7.8 % (ref 3.0–12.0)
Neutro Abs: 0.7 10*3/uL — ABNORMAL LOW (ref 1.4–7.7)
Neutrophils Relative %: 27.6 % — ABNORMAL LOW (ref 43.0–77.0)
Platelets: 270 10*3/uL (ref 150.0–400.0)
RBC: 4.13 Mil/uL (ref 3.87–5.11)
RDW: 14.9 % — ABNORMAL HIGH (ref 11.5–14.6)
WBC: 2.5 10*3/uL — ABNORMAL LOW (ref 4.5–10.5)

## 2012-09-26 LAB — POCT URINALYSIS DIPSTICK
Blood, UA: NEGATIVE
Glucose, UA: NEGATIVE
Nitrite, UA: NEGATIVE
Protein, UA: NEGATIVE
Spec Grav, UA: >=1.03
Urobilinogen, UA: 0.2
pH, UA: 5.5

## 2012-09-26 LAB — BASIC METABOLIC PANEL
BUN: 11 mg/dL (ref 6–23)
CO2: 28 mEq/L (ref 19–32)
Calcium: 9.1 mg/dL (ref 8.4–10.5)
Chloride: 106 mEq/L (ref 96–112)
Creatinine, Ser: 0.8 mg/dL (ref 0.4–1.2)
GFR: 79.57 mL/min (ref >60.00)
Glucose, Bld: 95 mg/dL (ref 70–99)
Potassium: 4.6 mEq/L (ref 3.5–5.1)
Sodium: 140 mEq/L (ref 135–145)

## 2012-09-26 LAB — LIPID PANEL
Cholesterol: 181 mg/dL (ref 0–200)
HDL: 44.7 mg/dL (ref >39.00)
LDL Cholesterol: 111 mg/dL — ABNORMAL HIGH (ref 0–99)
Total CHOL/HDL Ratio: 4
Triglycerides: 126 mg/dL (ref 0.0–149.0)
VLDL: 25.2 mg/dL (ref 0.0–40.0)

## 2012-09-26 LAB — TSH: TSH: 1.45 u[IU]/mL (ref 0.35–5.50)

## 2012-09-29 ENCOUNTER — Ambulatory Visit
Admission: RE | Admit: 2012-09-29 | Discharge: 2012-09-29 | Disposition: A | Discharge status: Home / Self Care | Payer: BC Managed Care – PPO | Source: Ambulatory Visit

## 2012-09-29 DIAGNOSIS — Z1231 Encounter for screening mammogram for malignant neoplasm of breast: Secondary | ICD-10-CM

## 2012-10-03 ENCOUNTER — Other Ambulatory Visit (HOSPITAL_COMMUNITY)
Admission: RE | Admit: 2012-10-03 | Discharge: 2012-10-03 | Disposition: A | Discharge status: Home / Self Care | Payer: BC Managed Care – PPO | Source: Ambulatory Visit | Attending: Internal Medicine | Admitting: Internal Medicine

## 2012-10-03 ENCOUNTER — Ambulatory Visit (INDEPENDENT_AMBULATORY_CARE_PROVIDER_SITE_OTHER): Payer: BC Managed Care – PPO | Admitting: Internal Medicine

## 2012-10-03 ENCOUNTER — Encounter: Payer: Self-pay | Admitting: Internal Medicine

## 2012-10-03 VITALS — BP 140/80 | HR 72 | Temp 98.6°F | Resp 16 | Ht 62.5 in | Wt 203.0 lb

## 2012-10-03 DIAGNOSIS — F458 Other somatoform disorders: Secondary | ICD-10-CM

## 2012-10-03 DIAGNOSIS — R05 Cough: Secondary | ICD-10-CM

## 2012-10-03 DIAGNOSIS — R059 Cough, unspecified: Secondary | ICD-10-CM

## 2012-10-03 DIAGNOSIS — Z0000 Encounter for general adult medical examination without abnormal findings: Secondary | ICD-10-CM

## 2012-10-03 DIAGNOSIS — R198 Other specified symptoms and signs involving the digestive system and abdomen: Secondary | ICD-10-CM

## 2012-10-03 DIAGNOSIS — N959 Unspecified menopausal and perimenopausal disorder: Secondary | ICD-10-CM

## 2012-10-03 DIAGNOSIS — K219 Gastro-esophageal reflux disease without esophagitis: Secondary | ICD-10-CM

## 2012-10-03 DIAGNOSIS — Z01419 Encounter for gynecological examination (general) (routine) without abnormal findings: Secondary | ICD-10-CM | POA: Insufficient documentation

## 2012-10-03 DIAGNOSIS — R0989 Other specified symptoms and signs involving the circulatory and respiratory systems: Secondary | ICD-10-CM

## 2012-10-03 DIAGNOSIS — R09A2 Foreign body sensation, throat: Secondary | ICD-10-CM

## 2012-10-03 DIAGNOSIS — J209 Acute bronchitis, unspecified: Secondary | ICD-10-CM

## 2012-10-03 DIAGNOSIS — I10 Essential (primary) hypertension: Secondary | ICD-10-CM

## 2012-10-03 MED ORDER — ALBUTEROL SULFATE HFA 108 (90 BASE) MCG/ACT IN AERS: 2.0000 | INHALATION_SPRAY | Freq: Four times a day (QID) | RESPIRATORY_TRACT | Status: DC | PRN

## 2012-10-03 MED ORDER — DEXLANSOPRAZOLE 60 MG PO CPDR: 60.0000 mg | DELAYED_RELEASE_CAPSULE | Freq: Every day | ORAL | Status: DC

## 2012-10-03 MED ORDER — CONJ ESTROG-MEDROXYPROGEST ACE 0.45-1.5 MG PO TABS: 1.0000 | ORAL_TABLET | Freq: Every day | ORAL | Status: DC

## 2012-10-03 MED ORDER — TELMISARTAN 80 MG PO TABS: 80.0000 mg | ORAL_TABLET | Freq: Every day | ORAL | Status: DC

## 2012-10-03 NOTE — Progress Notes (Signed)
Subjective:    Patient ID: Brandy Dunlap, female    DOB: Apr 18, 1950, 62 y.o.   MRN: [id number]  HPI CPX Still on HRT  When she try to stop has excessive vasomotor symptoms    Review of Systems  Constitutional: Negative for activity change, appetite change and fatigue.  HENT: Negative for ear pain, congestion, neck pain, postnasal drip and sinus pressure.   Eyes: Negative for redness and visual disturbance.  Respiratory: Negative for cough, shortness of breath and wheezing.   Gastrointestinal: Negative for abdominal pain and abdominal distention.  Genitourinary: Negative for dysuria, frequency and menstrual problem.  Musculoskeletal: Negative for myalgias, joint swelling and arthralgias.  Skin: Negative for rash and wound.  Neurological: Negative for dizziness, weakness and headaches.  Hematological: Negative for adenopathy. Does not bruise/bleed easily.  Psychiatric/Behavioral: Negative for sleep disturbance and decreased concentration.   Past Medical History  Diagnosis Date  . Low back pain   . GERD (gastroesophageal reflux disease)   . Hypertension   . Osteoarthritis   . Sinus trouble   . Colon polyp 10/06/2003  . B12 deficiency     History   Social History  . Marital Status: Married    Spouse Name: N/A    Number of Children: 1  . Years of Education: N/A   Occupational History  . teacher    Social History Main Topics  . Smoking status: Never Smoker   . Smokeless tobacco: Never Used  . Alcohol Use: No  . Drug Use: No  . Sexually Active: Yes   Other Topics Concern  . Not on file   Social History Narrative  . No narrative on file    Past Surgical History  Procedure Laterality Date  . Lumbar fusion  2008  . Cholecystectomy    . Tonsillectomy    . Partial hip arthroplasty Right 2009     hip replacement  . Colonoscopy w/ biopsies  10/06/2003    Family History  Problem Relation Age of Onset  . COPD Father     died at age 67  . Heart disease Brother      stent with 90%  . COPD Mother   . Heart disease Mother   . Ovarian cancer Mother   . Emphysema Father   . Emphysema Mother     Allergies  Allergen Reactions  . Pantoprazole Sodium     Current Outpatient Prescriptions on File Prior to Visit  Medication Sig Dispense Refill  . albuterol (PROVENTIL HFA;VENTOLIN HFA) 108 (90 BASE) MCG/ACT inhaler Inhale 2 puffs into the lungs every 6 (six) hours as needed for wheezing.  1 Inhaler  0  . dexlansoprazole (DEXILANT) 60 MG capsule Take 1 capsule (60 mg total) by mouth daily.  30 capsule  2  . estrogen, conjugated,-medroxyprogesterone (PREMPRO) 0.45-1.5 MG per tablet Take 1 tablet by mouth daily.  90 tablet  3  . loratadine (CLARITIN) 10 MG tablet Take 1 tablet (10 mg total) by mouth daily.  30 tablet  3  . Multiple Vitamin (MULTIVITAMIN) capsule Take 1 capsule by mouth daily.        [name] telmisartan (MICARDIS) 80 MG tablet Take 1 tablet (80 mg total) by mouth daily.  90 tablet  3   No current facility-administered medications on file prior to visit.    BP 140/80  Pulse 72  Temp(Src) 98.6 F (37 C)  Resp 16  Ht 5' 2.5" (1.588 m)  Wt 203 lb (92.08 kg)  BMI 36.51 kg/m2  Objective:   Physical Exam  Constitutional: She is oriented to person, place, and time. She appears well-developed and well-nourished. No distress.  HENT:  Head: Normocephalic and atraumatic.  Right Ear: External ear normal.  Left Ear: External ear normal.  Nose: Nose normal.  Mouth/Throat: Oropharynx is clear and moist.  Eyes: Conjunctivae and EOM are normal. Pupils are equal, round, and reactive to light.  Neck: Normal range of motion. Neck supple. No JVD present. No tracheal deviation present. No thyromegaly present.  Cardiovascular: Normal rate, regular rhythm, normal heart sounds and intact distal pulses.   No murmur heard. Pulmonary/Chest: Effort normal and breath sounds normal. She has no wheezes. She exhibits no tenderness.  Abdominal: Soft. Bowel  sounds are normal.  Musculoskeletal: Normal range of motion. She exhibits no edema and no tenderness.  Lymphadenopathy:    She has no cervical adenopathy.  Neurological: She is alert and oriented to person, place, and time. She has normal reflexes. No cranial nerve deficit.  Skin: Skin is warm and dry. She is not diaphoretic.  Psychiatric: She has a normal mood and affect. Her behavior is normal.          Assessment & Plan:   This is a routine physical examination for this healthy  Female. Reviewed all health maintenance protocols including mammography colonoscopy bone density and reviewed appropriate screening labs. Her immunization history was reviewed as well as her current medications and allergies refills of her chronic medications were given and the plan for yearly health maintenance was discussed all orders and referrals were made as appropriate.

## 2012-10-03 NOTE — Addendum Note (Signed)
Addended by: Willy Eddy on: 10/03/2012 12:46 PM   Modules accepted: Orders

## 2012-10-03 NOTE — Patient Instructions (Signed)
The patient is instructed to continue all medications as prescribed. Schedule followup with check out clerk upon leaving the clinic  

## 2012-10-10 ENCOUNTER — Other Ambulatory Visit: Payer: Self-pay | Admitting: *Deleted

## 2012-10-10 MED ORDER — FLUCONAZOLE 100 MG PO TABS: 100.0000 mg | ORAL_TABLET | Freq: Every day | ORAL | Status: DC

## 2012-10-16 ENCOUNTER — Other Ambulatory Visit: Payer: Self-pay | Admitting: *Deleted

## 2012-10-16 ENCOUNTER — Telehealth: Payer: Self-pay | Admitting: *Deleted

## 2012-10-16 MED ORDER — AZITHROMYCIN 250 MG PO TABS: ORAL_TABLET | ORAL | Status: DC

## 2012-10-16 NOTE — Telephone Encounter (Signed)
Pt aware it will be tomorrow before dr Lovell Sheehan answers questions

## 2012-10-16 NOTE — Telephone Encounter (Signed)
Per dr Lovell Sheehan- may have a z pack-sent  In.

## 2012-10-16 NOTE — Telephone Encounter (Signed)
Pt c/ sinusitis like sx with earache- tried everything dr Lovell Sheehan had told her to do , but has gotten worse- requesting something be called to pharmacy

## 2012-11-06 ENCOUNTER — Ambulatory Visit (INDEPENDENT_AMBULATORY_CARE_PROVIDER_SITE_OTHER): Payer: BC Managed Care – PPO | Admitting: Internal Medicine

## 2012-11-06 ENCOUNTER — Encounter: Payer: Self-pay | Admitting: Internal Medicine

## 2012-11-06 VITALS — BP 130/72 | HR 92 | Ht 62.5 in | Wt 201.8 lb

## 2012-11-06 DIAGNOSIS — R05 Cough: Secondary | ICD-10-CM

## 2012-11-06 DIAGNOSIS — K219 Gastro-esophageal reflux disease without esophagitis: Secondary | ICD-10-CM

## 2012-11-06 DIAGNOSIS — R059 Cough, unspecified: Secondary | ICD-10-CM

## 2012-11-06 NOTE — Patient Instructions (Addendum)
Sorry you are still coughing. I hope the antibiotics make a difference but if not then I would try the Neurontin as Dr. Jenne Pane suggest. See you next year for routine colonoscopy. Call sooner if needed.  I appreciate the opportunity to care for you.  Iva Boop, MD, [facility]

## 2012-11-06 NOTE — Assessment & Plan Note (Signed)
Being treated for sinusitis - CT + by Dr. Dub Amis does not treat cough considering Neurontin - I think that is appropriate to try and doubt further GI testing would reveal anything

## 2012-11-06 NOTE — Progress Notes (Signed)
  Subjective:    Patient ID: Brandy Dunlap, female    DOB: June 19, 1950, 62 y.o.   MRN: [id number]  HPI The patient returns for follow-up of GERD and cough. She was changed to Dexilant 60 mg daily 2 months ago. Heartburn controlled but cough persists. Had Ear sxs and was treated w/ azithromycin in August and then saw Dr. Jenne Pane of ENT. CT sinuses showed fluid/sinusitis and she has been on amoxicillin/clavulanate with 3 days to go. Still has cough. If that fails to work Dr. Jenne Pane is suggesting gabapentin for neurogenic cough. Records reviewed. Medications, allergies, past medical history, past surgical history, family history and social history are reviewed and updated in the EMR.  Review of Systems As above    Objective:   Physical Exam NAD    Assessment & Plan:  Cough  GERD  [id number] Jenne Pane, MD

## 2012-11-06 NOTE — Assessment & Plan Note (Signed)
Controlled by Dexilant Doubt related to cough

## 2012-11-21 ENCOUNTER — Ambulatory Visit (INDEPENDENT_AMBULATORY_CARE_PROVIDER_SITE_OTHER): Payer: BC Managed Care – PPO | Admitting: Emergency Medicine

## 2012-11-21 ENCOUNTER — Encounter: Payer: Self-pay | Admitting: Emergency Medicine

## 2012-11-21 VITALS — BP 130/76 | HR 80 | Temp 98.1°F | Ht 63.0 in | Wt 202.0 lb

## 2012-11-21 DIAGNOSIS — R05 Cough: Secondary | ICD-10-CM

## 2012-11-21 DIAGNOSIS — R059 Cough, unspecified: Secondary | ICD-10-CM

## 2012-11-21 NOTE — Progress Notes (Signed)
  Subjective:    Patient ID: Brandy Dunlap, female    DOB: 1950/04/03, 62 y.o.   MRN: [id number]  HPI 62 yo never smoker, hx of HTN, GERD, OA, "sinus trouble".  She is referred for chronic cough. States that she caught URI 12/13, all of the cold sx improved, then she caught the flu. She has kept a dry cough remained and has persisted. She does get some breaks for a few days but then it returns. She has been tried on tessalon, cough syrup + codeine. The cough happens all day, non-productive.   She was formerly on allergy shots but stopped these years ago.  She was changed from brand name micardis to generic telmisartan   ROV 08/08/12 -- f/u for chronic cough. Last time we increased nexium temporarily, added loratadine and nasonex, voice rest. She may have benefited some, especially when she was on double Nexium.   ROV 09/18/12 -- hx chronic cough. She has seen GI, changed to dexilant 60mg , has been adjusting to this, having diarrhea. Reflux is better. Still w some nasal gtt, esp at night.   ROV 11/21/12 -- follows up for her cough. Last time we ramped up her allergy regimen, she continued the GERD regimen. Her cough continued. CT scan sinuses showed fluid and chronic sinusitis. She was treated with augmentin x 10 by Dr Jenne Pane.    Review of Systems  Constitutional: Negative for fever and unexpected weight change.  HENT: Negative for ear pain, nosebleeds, congestion, sore throat, rhinorrhea, sneezing, trouble swallowing, dental problem, postnasal drip and sinus pressure.   Eyes: Negative for redness and itching.  Respiratory: Positive for cough. Negative for chest tightness, shortness of breath and wheezing.   Cardiovascular: Negative for palpitations and leg swelling.  Gastrointestinal: Negative for nausea and vomiting.  Genitourinary: Negative for dysuria.  Musculoskeletal: Negative for joint swelling.  Skin: Negative for rash.  Neurological: Negative for headaches.  Hematological: Does not  bruise/bleed easily.  Psychiatric/Behavioral: Negative for dysphoric mood. The patient is not nervous/anxious.       Objective:   Physical Exam Filed Vitals:   11/21/12 1606  BP: 130/76  Pulse: 80  Temp: 98.1 F (36.7 C)  TempSrc: Oral  Height: 5\' 3"  (1.6 m)  Weight: 202 lb (91.627 kg)  SpO2: 97%   Gen: Pleasant, well-nourished, in no distress,  normal affect  ENT: No lesions,  mouth clear,  oropharynx clear, no postnasal drip  Neck: No JVD, no TMG, no carotid bruits  Lungs: No use of accessory muscles, clear without rales or rhonchi  Cardiovascular: RRR, heart sounds normal, no murmur or gallops, no peripheral edema  Musculoskeletal: No deformities, no cyanosis or clubbing  Neuro: alert, non focal  Skin: Warm, no lesions or rashes     Assessment & Plan:  Cough Please continue your medications as you have been taking them Depending on your evaluation with Dr Jenne Pane he may decide to treat you longer for chronic sinusitis.  We will perform full pulmonary function testing We will schedule bronchoscopy to rule out an anatomical contributor to your cough Follow with Dr Delton Coombes in 1 month

## 2012-11-21 NOTE — Assessment & Plan Note (Signed)
Please continue your medications as you have been taking them Depending on your evaluation with Dr Jenne Pane he may decide to treat you longer for chronic sinusitis.  We will perform full pulmonary function testing We will schedule bronchoscopy to rule out an anatomical contributor to your cough Follow with Dr Delton Coombes in 1 month

## 2012-11-21 NOTE — Patient Instructions (Addendum)
Please continue your medications as you have been taking them Depending on your evaluation with Dr Bates he may decide to treat you longer for chronic sinusitis.  We will perform full pulmonary function testing We will schedule bronchoscopy to rule out an anatomical contributor to your cough Follow with Dr Shrita Thien in 1 month 

## 2012-11-24 ENCOUNTER — Telehealth: Payer: Self-pay | Admitting: Emergency Medicine

## 2012-11-24 NOTE — Telephone Encounter (Signed)
Pt was seen on 11/21/12 by RB with the following instructions:  Patient Instructions    Please continue your medications as you have been taking them  Depending on your evaluation with Dr Jenne Pane he may decide to treat you longer for chronic sinusitis.  We will perform full pulmonary function testing  We will schedule bronchoscopy to rule out an anatomical contributor to your cough  Follow with Dr Delton Coombes in 1 month   ------  Called, spoke with pt.  She hasn't heard anything about the bronch date yet.  She is checking on the status of this.  Pt states a Thursday or Friday will be better for her work scheduled.  She can do a Wednesday if neither of these days work with RB's schedule.  Pt states she will need a 3-4 days notice, though, to make arrangements at work.  RB, pls advise.  Thank you.

## 2012-11-25 NOTE — Telephone Encounter (Signed)
Spoke with Marcelino Duster in Respiratory and cancelled procedure. Paged Dr. Delton Coombes and also informed him patient unable to have procedure done Will forward msg to Dr. Delton Coombes please advise and date you are available.  Patient available on Wednesday, Thursday and Friday of next week Thank You

## 2012-11-25 NOTE — Telephone Encounter (Signed)
Need to set up FOB at [facility] - no fluoro, no TB risk.

## 2012-11-25 NOTE — Telephone Encounter (Signed)
FOB has been scheduled for 11/26/2012 @ 1pm @ [facility]  ATC patient to inform her of this ( and that Respiratory Dept will contact with further directions ie. Register with admitting, NPO after midnight, per Marcelino Duster in respiratory patient needs to be there at 1145am)  No answer on home or mobile number, [email or web address]    Will forward to Dr. Delton Coombes as Lorain Childes

## 2012-11-25 NOTE — Telephone Encounter (Signed)
Patient calling stating she can not do 10/1.   Asking for Wed, Thurs, or Friday of next week.  Please advise Lauren.

## 2012-11-26 ENCOUNTER — Inpatient Hospital Stay (HOSPITAL_COMMUNITY): Admission: RE | Admit: 2012-11-26 | Payer: BC Managed Care – PPO | Source: Ambulatory Visit

## 2012-11-26 ENCOUNTER — Encounter (HOSPITAL_COMMUNITY): Admission: RE | Payer: Self-pay | Source: Ambulatory Visit

## 2012-11-26 ENCOUNTER — Ambulatory Visit (HOSPITAL_COMMUNITY)
Admission: RE | Admit: 2012-11-26 | Payer: BC Managed Care – PPO | Source: Ambulatory Visit | Admitting: Emergency Medicine

## 2012-11-26 SURGERY — VIDEO BRONCHOSCOPY WITHOUT FLUORO
Anesthesia: Moderate Sedation | Laterality: Bilateral

## 2012-11-26 NOTE — Telephone Encounter (Signed)
I called pt and had to [facility]. I advised her we are still waiting on a date RB can do. I advised her will call back once he lets [name] know. Please advise RB thanks

## 2012-11-26 NOTE — Telephone Encounter (Signed)
Patient calling to see if broch has been rescheduled.  [phone]  Okay to leave message on machine.

## 2012-11-27 NOTE — Telephone Encounter (Signed)
Pt aware of Bronch date and time scheduled.  Aware of pre-procedure instructions.   Will send to RB as FYI of date/time: Dec 05, 2012 at 7:30 am at [facility].

## 2012-11-27 NOTE — Telephone Encounter (Signed)
I can do early afternoon on Monday 10/6 or in the morning 10/7 - 10/10.

## 2012-11-27 NOTE — Telephone Encounter (Signed)
FOB scheduled for Dec 05, 2012 at 7:30 am at [facility].  Pt to arrive at 6:15 am and NPO after MN.   I have lmomtcb on pt's home and cell #s. Once we confirm this date is ok with pt, we will need to send to RB so he is aware of date, time, and location.

## 2012-12-05 ENCOUNTER — Ambulatory Visit (HOSPITAL_COMMUNITY)
Admission: RE | Admit: 2012-12-05 | Discharge: 2012-12-05 | Disposition: A | Discharge status: Home / Self Care | Payer: BC Managed Care – PPO | Source: Ambulatory Visit | Attending: Emergency Medicine | Admitting: Emergency Medicine

## 2012-12-05 ENCOUNTER — Encounter (HOSPITAL_COMMUNITY): Payer: Self-pay | Admitting: Respiratory Therapy

## 2012-12-05 ENCOUNTER — Encounter (HOSPITAL_COMMUNITY)
Admission: RE | Disposition: A | Discharge status: Home / Self Care | Payer: BC Managed Care – PPO | Source: Ambulatory Visit | Attending: Emergency Medicine

## 2012-12-05 DIAGNOSIS — R059 Cough, unspecified: Secondary | ICD-10-CM

## 2012-12-05 DIAGNOSIS — I10 Essential (primary) hypertension: Secondary | ICD-10-CM | POA: Insufficient documentation

## 2012-12-05 DIAGNOSIS — K219 Gastro-esophageal reflux disease without esophagitis: Secondary | ICD-10-CM | POA: Insufficient documentation

## 2012-12-05 DIAGNOSIS — J329 Chronic sinusitis, unspecified: Secondary | ICD-10-CM | POA: Insufficient documentation

## 2012-12-05 DIAGNOSIS — R05 Cough: Secondary | ICD-10-CM

## 2012-12-05 HISTORY — PX: VIDEO BRONCHOSCOPY: SHX5072

## 2012-12-05 SURGERY — VIDEO BRONCHOSCOPY WITHOUT FLUORO
Anesthesia: Moderate Sedation | Laterality: Bilateral

## 2012-12-05 MED ORDER — SODIUM CHLORIDE 0.9 % IV SOLN: INTRAVENOUS | Status: DC

## 2012-12-05 MED ORDER — LIDOCAINE HCL 2 % EX GEL
Freq: Once | CUTANEOUS | Status: DC
  Filled 2012-12-05: qty 5

## 2012-12-05 MED ORDER — PHENYLEPHRINE HCL 0.25 % NA SOLN
1.0000 | Freq: Four times a day (QID) | NASAL | Status: DC | PRN
  Filled 2012-12-05: qty 15

## 2012-12-05 MED ORDER — LIDOCAINE HCL 2 % EX GEL
CUTANEOUS | Status: DC | PRN
  Administered 2012-12-05: 1

## 2012-12-05 MED ORDER — MIDAZOLAM HCL 10 MG/2ML IJ SOLN
INTRAMUSCULAR | Status: DC | PRN
  Administered 2012-12-05: 2 mg via INTRAVENOUS
  Administered 2012-12-05: 4 mg via INTRAVENOUS

## 2012-12-05 MED ORDER — MIDAZOLAM HCL 10 MG/2ML IJ SOLN
INTRAMUSCULAR | Status: AC
  Filled 2012-12-05: qty 4

## 2012-12-05 MED ORDER — PHENYLEPHRINE HCL 0.25 % NA SOLN
NASAL | Status: DC | PRN
  Administered 2012-12-05: 2 via NASAL

## 2012-12-05 MED ORDER — LIDOCAINE HCL 1 % IJ SOLN
INTRAMUSCULAR | Status: DC | PRN
  Administered 2012-12-05: 6 mL via RESPIRATORY_TRACT

## 2012-12-05 MED ORDER — FENTANYL CITRATE 0.05 MG/ML IJ SOLN
INTRAMUSCULAR | Status: AC
  Filled 2012-12-05: qty 4

## 2012-12-05 MED ORDER — FENTANYL CITRATE 0.05 MG/ML IJ SOLN
INTRAMUSCULAR | Status: DC | PRN
  Administered 2012-12-05: 50 ug via INTRAVENOUS
  Administered 2012-12-05: 25 ug via INTRAVENOUS

## 2012-12-05 NOTE — Interval H&P Note (Signed)
PCCM Interval Note  No new issues - she continues to have cough, little change  Filed Vitals:   12/05/12 0651 12/05/12 0731 12/05/12 0732  BP: 145/81    Pulse: 70 65   Resp: 25 35   SpO2: 95% 97% 97%   Plans:  Will proceed with FOB for inspection airways, eval for etiology for cough.   Levy Pupa, MD, PhD 12/05/2012, 7:47 AM [facility] [phone] or if no answer [phone]

## 2012-12-05 NOTE — Progress Notes (Signed)
Video Bronchoscopy done  Intervention Bronchial washing done Intervention Bronchial biopsy done Intervention bronchial brushings done  Procedure tolerated well  Levy Pupa, MD, PhD 12/05/2012, 6:13 PM [facility] [phone] or if no answer [phone]

## 2012-12-05 NOTE — Op Note (Signed)
Video Bronchoscopy Procedure Note  Date of Operation: 12/05/2012  Pre-op Diagnosis: chronic cough  Post-op Diagnosis: Same  Surgeon: Levy Pupa  Assistants: none  Anesthesia: conscious sedation, moderate sedation  Meds Given: fentanyl , versed 6mg  in divided doses, 1% lidocaine 18cc total  Operation: Flexible video fiberoptic bronchoscopy and biopsies.  Estimated Blood Loss: 0cc  Complications: none noted  Indications and History: Brandy Dunlap is 62 y.o. with history of cough.  Recommendation was to perform video fiberoptic bronchoscopy to visualize the airways. The risks, benefits, complications, treatment options and expected outcomes were discussed with the patient.  The possibilities of pneumothorax, pneumonia, reaction to medication, pulmonary aspiration, perforation of a viscus, bleeding, failure to diagnose a condition and creating a complication requiring transfusion or operation were discussed with the patient who freely signed the consent.    Description of Procedure: The patient was seen in the Preoperative Area, was examined and was deemed appropriate to proceed.  The patient was taken to [facility], identified as Brandy Dunlap and the procedure verified as Flexible Video Fiberoptic Bronchoscopy.  A Time Out was held and the above information confirmed.   Conscious sedation was initiated as indicated above. The video fiberoptic bronchoscope was introduced via the oropharynx and a general inspection was performed which showed cobblestoning of the posterior pharynx, normal false and true cords. The trachea showed multiple sub-centimeter pale, raised lesions on the anterior wall mucosa. There was no ulceration. Photos were taken of this region and then endotracheal brushings and forceps biopsies were take. Final tracheal washings were collected for cytology and microbiology. Distal inspection showed normal main carina. The R sided airways were inspected and  showed normal RUL, BI, RML and RLL. The L side was then inspected. The LLL, Lingular and LUL airways were normal. There were none of the pale lesions in the distal airways, only the trachea. The patient tolerated the procedure well. The bronchoscope was removed. There were no obvious complications.   Samples: 1. Endobronchial brushings from trachea 2. Endobronchial forceps biopsies from trachea 3. Bronchial washings from trachea  Plans:  We will review the cytology, pathology and microbiology results with the patient when they become available.  Outpatient followup will be with Dr Delton Coombes.    Levy Pupa, MD, PhD 12/05/2012, 8:14 AM [facility] [phone] or if no answer [phone]

## 2012-12-05 NOTE — H&P (View-Only) (Signed)
  Subjective:    Patient ID: Brandy Dunlap, female    DOB: 1950-11-27, 62 y.o.   MRN: [id number]  HPI 62 yo never smoker, hx of HTN, GERD, OA, "sinus trouble".  She is referred for chronic cough. States that she caught URI 12/13, all of the cold sx improved, then she caught the flu. She has kept a dry cough remained and has persisted. She does get some breaks for a few days but then it returns. She has been tried on tessalon, cough syrup + codeine. The cough happens all day, non-productive.   She was formerly on allergy shots but stopped these years ago.  She was changed from brand name micardis to generic telmisartan   ROV 08/08/12 -- f/u for chronic cough. Last time we increased nexium temporarily, added loratadine and nasonex, voice rest. She may have benefited some, especially when she was on double Nexium.   ROV 09/18/12 -- hx chronic cough. She has seen GI, changed to dexilant 60mg , has been adjusting to this, having diarrhea. Reflux is better. Still w some nasal gtt, esp at night.   ROV 11/21/12 -- follows up for her cough. Last time we ramped up her allergy regimen, she continued the GERD regimen. Her cough continued. CT scan sinuses showed fluid and chronic sinusitis. She was treated with augmentin x 10 by Dr Jenne Pane.    Review of Systems  Constitutional: Negative for fever and unexpected weight change.  HENT: Negative for ear pain, nosebleeds, congestion, sore throat, rhinorrhea, sneezing, trouble swallowing, dental problem, postnasal drip and sinus pressure.   Eyes: Negative for redness and itching.  Respiratory: Positive for cough. Negative for chest tightness, shortness of breath and wheezing.   Cardiovascular: Negative for palpitations and leg swelling.  Gastrointestinal: Negative for nausea and vomiting.  Genitourinary: Negative for dysuria.  Musculoskeletal: Negative for joint swelling.  Skin: Negative for rash.  Neurological: Negative for headaches.  Hematological: Does not  bruise/bleed easily.  Psychiatric/Behavioral: Negative for dysphoric mood. The patient is not nervous/anxious.       Objective:   Physical Exam Filed Vitals:   11/21/12 1606  BP: 130/76  Pulse: 80  Temp: 98.1 F (36.7 C)  TempSrc: Oral  Height: 5\' 3"  (1.6 m)  Weight: 202 lb (91.627 kg)  SpO2: 97%   Gen: Pleasant, well-nourished, in no distress,  normal affect  ENT: No lesions,  mouth clear,  oropharynx clear, no postnasal drip  Neck: No JVD, no TMG, no carotid bruits  Lungs: No use of accessory muscles, clear without rales or rhonchi  Cardiovascular: RRR, heart sounds normal, no murmur or gallops, no peripheral edema  Musculoskeletal: No deformities, no cyanosis or clubbing  Neuro: alert, non focal  Skin: Warm, no lesions or rashes     Assessment & Plan:  Cough Please continue your medications as you have been taking them Depending on your evaluation with Dr Jenne Pane he may decide to treat you longer for chronic sinusitis.  We will perform full pulmonary function testing We will schedule bronchoscopy to rule out an anatomical contributor to your cough Follow with Dr Delton Coombes in 1 month

## 2012-12-07 LAB — CULTURE, BAL-QUANTITATIVE W GRAM STAIN

## 2012-12-07 LAB — CULTURE, BAL-QUANTITATIVE
Colony Count: >=100000
Gram Stain: NONE SEEN

## 2012-12-08 ENCOUNTER — Encounter (HOSPITAL_COMMUNITY): Payer: Self-pay | Admitting: Emergency Medicine

## 2012-12-08 ENCOUNTER — Telehealth: Payer: Self-pay | Admitting: Emergency Medicine

## 2012-12-08 NOTE — Telephone Encounter (Signed)
Copied from MyChart:  Appointment Request From: Brandy Dunlap  With Provider: Leslye Peer., MD [facility]]  Preferred Date Range: From 12/09/2012 To 12/10/2012 Preferred Times: Tuesday Afternoon, Wednesday Afternoon  Reason for visit:  Office Visit Comments: Would it be possible to see Dr Delton Coombes about test results on Tuesday at 4:00?? YOu have me an appt on Oct 29th at 2:30 . 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 Thanks, Brandy Dunlap

## 2012-12-08 NOTE — Telephone Encounter (Signed)
Returning call can be reached at [phone].Brandy Dunlap

## 2012-12-08 NOTE — Telephone Encounter (Signed)
lmomtcb x1 for pt 

## 2012-12-08 NOTE — Telephone Encounter (Signed)
I spoke with the pt and she states she has an appt on 12-24-12 but does not want to wait until then for results. Please advise. Carron Curie, CMA

## 2012-12-09 ENCOUNTER — Telehealth: Payer: Self-pay | Admitting: Emergency Medicine

## 2012-12-09 NOTE — Telephone Encounter (Signed)
Reviewed results with the patient. Will follow viral studies. May need repeat FOB, other procedure, to identify the hypopigmented areas in the trachea

## 2012-12-10 NOTE — Telephone Encounter (Signed)
Per appt notes, may double-book per RB Appt moved down to the 3.30p slot for 10.29.14 Called pt's cell, left detailed message informing her of the above. Will sign off

## 2012-12-10 NOTE — Telephone Encounter (Signed)
Pt has left a message on MyChart regarding the same-copied below.    Appointment Request From: Brandy Dunlap  With Provider: Leslye Peer., MD [facility]]  Preferred Date Range: Any date 12/24/2012 or later  Preferred Times:  Any Reason for visit: Annual Physical  Comments: You have me scheduled for a 2:30 appt. 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 Thanks, Brandy Dunlap  Please advise.  Antionette Fairy

## 2012-12-24 ENCOUNTER — Encounter: Payer: Self-pay | Admitting: Emergency Medicine

## 2012-12-24 ENCOUNTER — Ambulatory Visit (INDEPENDENT_AMBULATORY_CARE_PROVIDER_SITE_OTHER): Payer: BC Managed Care – PPO | Admitting: Emergency Medicine

## 2012-12-24 VITALS — BP 122/78 | HR 80 | Ht 63.0 in | Wt 201.2 lb

## 2012-12-24 DIAGNOSIS — J398 Other specified diseases of upper respiratory tract: Secondary | ICD-10-CM

## 2012-12-24 DIAGNOSIS — R05 Cough: Secondary | ICD-10-CM

## 2012-12-24 DIAGNOSIS — R059 Cough, unspecified: Secondary | ICD-10-CM

## 2012-12-24 DIAGNOSIS — J988 Other specified respiratory disorders: Secondary | ICD-10-CM | POA: Insufficient documentation

## 2012-12-24 HISTORY — DX: Other specified diseases of upper respiratory tract: J39.8

## 2012-12-24 NOTE — Assessment & Plan Note (Signed)
Not clear to me that the tracheal lesions are related to her cough, but they could be. Her cough has improved on current regimen. Will continue same. She will follow with Dr Jenne Pane

## 2012-12-24 NOTE — Assessment & Plan Note (Signed)
Unclear etiology - will discuss with [facility] about possible causes, possible utility of a rigid bronchoscopy. I will call her after discussing

## 2012-12-24 NOTE — Progress Notes (Signed)
Subjective:    Patient ID: Brandy Dunlap, female    DOB: [birth year], 62 y.o.   MRN: [id number]  Cough Pertinent negatives include no ear pain, eye redness, fever, headaches, postnasal drip, rash, rhinorrhea, sore throat, shortness of breath or wheezing.   62 yo never smoker, hx of HTN, GERD, OA, "sinus trouble".  She is referred for chronic cough. States that she caught URI 12/13, all of the cold sx improved, then she caught the flu. She has kept a dry cough remained and has persisted. She does get some breaks for a few days but then it returns. She has been tried on tessalon, cough syrup + codeine. The cough happens all day, non-productive.   She was formerly on allergy shots but stopped these years ago.  She was changed from brand name micardis to generic telmisartan   ROV 08/08/12 -- f/u for chronic cough. Last time we increased nexium temporarily, added loratadine and nasonex, voice rest. She may have benefited some, especially when she was on double Nexium.   ROV 09/18/12 -- hx chronic cough. She has seen GI, changed to dexilant 60mg , has been adjusting to this, having diarrhea. Reflux is better. Still w some nasal gtt, esp at night.   ROV 11/21/12 -- follows up for her cough. Last time we ramped up her allergy regimen, she continued the GERD regimen. Her cough continued. CT scan sinuses showed fluid and chronic sinusitis. She was treated with augmentin x 10 by Dr Jenne Pane.   ROV 12/24/12 -- follow up for cough, allergies, GERD. She is s/p FOB 10/10 that showed some raised hypopigmented lesions in the trachea. Bx and micro negative, not clear that viral cx was done. Suspicion for possible granulomatous inflammation, ? Possible viral process. Did not look like thrush. Her cough is better on current regimen.    Review of Systems  Constitutional: Negative for fever and unexpected weight change.  HENT: Negative for congestion, dental problem, ear pain, nosebleeds, postnasal drip, rhinorrhea, sinus  pressure, sneezing, sore throat and trouble swallowing.   Eyes: Negative for redness and itching.  Respiratory: Positive for cough. Negative for chest tightness, shortness of breath and wheezing.   Cardiovascular: Negative for palpitations and leg swelling.  Gastrointestinal: Negative for nausea and vomiting.  Genitourinary: Negative for dysuria.  Musculoskeletal: Negative for joint swelling.  Skin: Negative for rash.  Neurological: Negative for headaches.  Hematological: Does not bruise/bleed easily.  Psychiatric/Behavioral: Negative for dysphoric mood. The patient is not nervous/anxious.       Objective:   Physical Exam Filed Vitals:   12/24/12 1541  BP: 122/78  Pulse: 80  Height: 5\' 3"  (1.6 m)  Weight: 201 lb 3.2 oz (91.264 kg)  SpO2: 96%   Gen: Pleasant, well-nourished, in no distress,  normal affect  ENT: No lesions,  mouth clear,  oropharynx clear, no postnasal drip  Neck: No JVD, no TMG, no carotid bruits  Lungs: No use of accessory muscles, clear without rales or rhonchi  Cardiovascular: RRR, heart sounds normal, no murmur or gallops, no peripheral edema  Musculoskeletal: No deformities, no cyanosis or clubbing  Neuro: alert, non focal  Skin: Warm, no lesions or rashes     Assessment & Plan:  Cough Not clear to me that the tracheal lesions are related to her cough, but they could be. Her cough has improved on current regimen. Will continue same. She will follow with Dr Jenne Pane  Tracheal nodule Unclear etiology - will discuss with [facility] about possible causes, possible utility  of a rigid bronchoscopy. I will call her after discussing

## 2012-12-24 NOTE — Patient Instructions (Signed)
Please continue your medications as you are taking them Follow with Dr Jenne Pane as planned Dr Delton Coombes will call you after discussing your case with thoracic surgery  We will plan follow up after speaking by phone

## 2012-12-25 ENCOUNTER — Encounter: Payer: Self-pay | Admitting: Emergency Medicine

## 2012-12-25 DIAGNOSIS — J988 Other specified respiratory disorders: Secondary | ICD-10-CM

## 2012-12-25 DIAGNOSIS — J398 Other specified diseases of upper respiratory tract: Secondary | ICD-10-CM

## 2013-01-01 ENCOUNTER — Other Ambulatory Visit: Payer: Self-pay

## 2013-01-01 LAB — FUNGUS CULTURE W SMEAR: Fungal Smear: NONE SEEN

## 2013-01-07 NOTE — Telephone Encounter (Signed)
Per OV 12/24/12: Patient Instructions    Please continue your medications as you are taking them  Follow with Dr Jenne Pane as planned  Dr Delton Coombes will call you after discussing your case with thoracic surgery  We will plan follow up after speaking by phone  ---  [facility] x1 for pt. RB is off until Monday and will have to speak with him then.

## 2013-01-17 LAB — AFB CULTURE WITH SMEAR (NOT AT ARMC): Acid Fast Smear: NONE SEEN

## 2013-01-19 NOTE — Telephone Encounter (Signed)
Per pt requests I LM on voicemail advising of the below and order has been placed for appt with Dr. Jenne Pane. Carron Curie, CMA

## 2013-01-19 NOTE — Telephone Encounter (Signed)
Dr Jenne Pane called me about the next steps to evaluate her airway > he believes that he can get to the area of interest without needing to go to [facility] or have another kind of bronchoscopy. Next step will be to set up visit and probably procedure with dr Jenne Pane.

## 2013-01-24 ENCOUNTER — Emergency Department (INDEPENDENT_AMBULATORY_CARE_PROVIDER_SITE_OTHER): Payer: BC Managed Care – PPO

## 2013-01-24 ENCOUNTER — Encounter (HOSPITAL_COMMUNITY): Payer: Self-pay | Admitting: Emergency Medicine

## 2013-01-24 ENCOUNTER — Emergency Department (INDEPENDENT_AMBULATORY_CARE_PROVIDER_SITE_OTHER)
Admission: EM | Admit: 2013-01-24 | Discharge: 2013-01-24 | Disposition: A | Discharge status: Other Acute Inpatient Hospital | Payer: BC Managed Care – PPO | Source: Home / Self Care

## 2013-01-24 ENCOUNTER — Inpatient Hospital Stay (HOSPITAL_COMMUNITY)
Admission: EM | Admit: 2013-01-24 | Discharge: 2013-01-26 | DRG: 871 | Disposition: A | Discharge status: Home / Self Care | Payer: BC Managed Care – PPO | Attending: Internal Medicine | Admitting: Internal Medicine

## 2013-01-24 DIAGNOSIS — N289 Disorder of kidney and ureter, unspecified: Secondary | ICD-10-CM

## 2013-01-24 DIAGNOSIS — R05 Cough: Secondary | ICD-10-CM

## 2013-01-24 DIAGNOSIS — Z8041 Family history of malignant neoplasm of ovary: Secondary | ICD-10-CM

## 2013-01-24 DIAGNOSIS — J398 Other specified diseases of upper respiratory tract: Secondary | ICD-10-CM

## 2013-01-24 DIAGNOSIS — J189 Pneumonia, unspecified organism: Secondary | ICD-10-CM

## 2013-01-24 DIAGNOSIS — A419 Sepsis, unspecified organism: Principal | ICD-10-CM | POA: Diagnosis present

## 2013-01-24 DIAGNOSIS — J309 Allergic rhinitis, unspecified: Secondary | ICD-10-CM

## 2013-01-24 DIAGNOSIS — Z8249 Family history of ischemic heart disease and other diseases of the circulatory system: Secondary | ICD-10-CM

## 2013-01-24 DIAGNOSIS — Z96649 Presence of unspecified artificial hip joint: Secondary | ICD-10-CM

## 2013-01-24 DIAGNOSIS — M545 Low back pain, unspecified: Secondary | ICD-10-CM

## 2013-01-24 DIAGNOSIS — I10 Essential (primary) hypertension: Secondary | ICD-10-CM | POA: Diagnosis present

## 2013-01-24 DIAGNOSIS — Z981 Arthrodesis status: Secondary | ICD-10-CM

## 2013-01-24 DIAGNOSIS — Z23 Encounter for immunization: Secondary | ICD-10-CM

## 2013-01-24 DIAGNOSIS — E860 Dehydration: Secondary | ICD-10-CM | POA: Diagnosis present

## 2013-01-24 DIAGNOSIS — E538 Deficiency of other specified B group vitamins: Secondary | ICD-10-CM | POA: Diagnosis present

## 2013-01-24 DIAGNOSIS — N951 Menopausal and female climacteric states: Secondary | ICD-10-CM

## 2013-01-24 DIAGNOSIS — M1990 Unspecified osteoarthritis, unspecified site: Secondary | ICD-10-CM | POA: Diagnosis present

## 2013-01-24 DIAGNOSIS — N179 Acute kidney failure, unspecified: Secondary | ICD-10-CM | POA: Diagnosis present

## 2013-01-24 DIAGNOSIS — J988 Other specified respiratory disorders: Secondary | ICD-10-CM

## 2013-01-24 DIAGNOSIS — R609 Edema, unspecified: Secondary | ICD-10-CM

## 2013-01-24 DIAGNOSIS — K219 Gastro-esophageal reflux disease without esophagitis: Secondary | ICD-10-CM | POA: Diagnosis present

## 2013-01-24 DIAGNOSIS — R059 Cough, unspecified: Secondary | ICD-10-CM

## 2013-01-24 LAB — COMPREHENSIVE METABOLIC PANEL
ALT: 10 U/L (ref 0–35)
AST: 13 U/L (ref 0–37)
Albumin: 2.9 g/dL — ABNORMAL LOW (ref 3.5–5.2)
Alkaline Phosphatase: 97 U/L (ref 39–117)
BUN: 38 mg/dL — ABNORMAL HIGH (ref 6–23)
CO2: 21 mEq/L (ref 19–32)
Calcium: 8.6 mg/dL (ref 8.4–10.5)
Chloride: 98 mEq/L (ref 96–112)
Creatinine, Ser: 3.5 mg/dL — ABNORMAL HIGH (ref 0.50–1.10)
GFR calc Af Amer: 15 mL/min — ABNORMAL LOW (ref >90)
GFR calc non Af Amer: 13 mL/min — ABNORMAL LOW (ref >90)
Glucose, Bld: 95 mg/dL (ref 70–99)
Potassium: 3.9 mEq/L (ref 3.5–5.1)
Sodium: 133 mEq/L — ABNORMAL LOW (ref 135–145)
Total Bilirubin: 0.4 mg/dL (ref 0.3–1.2)
Total Protein: 7.1 g/dL (ref 6.0–8.3)

## 2013-01-24 LAB — CG4 I-STAT (LACTIC ACID): Lactic Acid, Venous: 1.01 mmol/L (ref 0.5–2.2)

## 2013-01-24 LAB — URINALYSIS, ROUTINE W REFLEX MICROSCOPIC
Bilirubin Urine: NEGATIVE
Glucose, UA: NEGATIVE mg/dL
Ketones, ur: NEGATIVE mg/dL
Leukocytes, UA: NEGATIVE
Nitrite: NEGATIVE
Protein, ur: 30 mg/dL — AB
Specific Gravity, Urine: 1.014 (ref 1.005–1.030)
Urobilinogen, UA: 0.2 mg/dL (ref 0.0–1.0)
pH: 5.5 (ref 5.0–8.0)

## 2013-01-24 LAB — CBC WITH DIFFERENTIAL/PLATELET
Basophils Absolute: 0 10*3/uL (ref 0.0–0.1)
Basophils Relative: 0 % (ref 0–1)
Eosinophils Absolute: 0.1 10*3/uL (ref 0.0–0.7)
Eosinophils Relative: 1 % (ref 0–5)
HCT: 35 % — ABNORMAL LOW (ref 36.0–46.0)
Hemoglobin: 12.2 g/dL (ref 12.0–15.0)
Lymphocytes Relative: 8 % — ABNORMAL LOW (ref 12–46)
Lymphs Abs: 1.1 10*3/uL (ref 0.7–4.0)
MCH: 29.3 pg (ref 26.0–34.0)
MCHC: 34.9 g/dL (ref 30.0–36.0)
MCV: 84.1 fL (ref 78.0–100.0)
Monocytes Absolute: 1.9 10*3/uL — ABNORMAL HIGH (ref 0.1–1.0)
Monocytes Relative: 14 % — ABNORMAL HIGH (ref 3–12)
Neutro Abs: 10.4 10*3/uL — ABNORMAL HIGH (ref 1.7–7.7)
Neutrophils Relative %: 77 % (ref 43–77)
Platelets: 258 10*3/uL (ref 150–400)
RBC: 4.16 MIL/uL (ref 3.87–5.11)
RDW: 15 % (ref 11.5–15.5)
WBC: 13.5 10*3/uL — ABNORMAL HIGH (ref 4.0–10.5)

## 2013-01-24 LAB — POCT RAPID STREP A: Streptococcus, Group A Screen (Direct): NEGATIVE

## 2013-01-24 LAB — POCT I-STAT TROPONIN I: Troponin i, poc: 0.03 ng/mL (ref 0.00–0.08)

## 2013-01-24 LAB — URINE MICROSCOPIC-ADD ON

## 2013-01-24 MED ORDER — TRAMADOL HCL 50 MG PO TABS
100.0000 mg | ORAL_TABLET | Freq: Once | ORAL | Status: AC
  Administered 2013-01-24: 100 mg via ORAL
  Filled 2013-01-24: qty 2

## 2013-01-24 MED ORDER — ALBUTEROL SULFATE (5 MG/ML) 0.5% IN NEBU
5.0000 mg | INHALATION_SOLUTION | Freq: Once | RESPIRATORY_TRACT | Status: AC
  Administered 2013-01-24: 5 mg via RESPIRATORY_TRACT
  Filled 2013-01-24: qty 1

## 2013-01-24 MED ORDER — ACETAMINOPHEN 650 MG RE SUPP: 650.0000 mg | Freq: Four times a day (QID) | RECTAL | Status: DC | PRN

## 2013-01-24 MED ORDER — SODIUM CHLORIDE 0.9 % IV SOLN
1000.0000 mL | INTRAVENOUS | Status: DC
  Administered 2013-01-24: 1000 mL via INTRAVENOUS

## 2013-01-24 MED ORDER — MULTIVITAMINS PO CAPS: 1.0000 | ORAL_CAPSULE | Freq: Every day | ORAL | Status: DC

## 2013-01-24 MED ORDER — POLYETHYLENE GLYCOL 3350 17 G PO PACK
17.0000 g | PACK | Freq: Every day | ORAL | Status: DC | PRN
  Filled 2013-01-24: qty 1

## 2013-01-24 MED ORDER — SODIUM CHLORIDE 0.9 % IV SOLN
INTRAVENOUS | Status: DC
  Administered 2013-01-24 – 2013-01-25 (×5): via INTRAVENOUS

## 2013-01-24 MED ORDER — GABAPENTIN 300 MG PO CAPS
300.0000 mg | ORAL_CAPSULE | Freq: Two times a day (BID) | ORAL | Status: DC
  Administered 2013-01-24 – 2013-01-26 (×4): 300 mg via ORAL
  Filled 2013-01-24 (×5): qty 1

## 2013-01-24 MED ORDER — ACETAMINOPHEN 325 MG PO TABS: 650.0000 mg | ORAL_TABLET | Freq: Four times a day (QID) | ORAL | Status: DC | PRN

## 2013-01-24 MED ORDER — AZITHROMYCIN 500 MG IV SOLR: 500.0000 mg | INTRAVENOUS | Status: DC

## 2013-01-24 MED ORDER — SODIUM CHLORIDE 0.9 % IV SOLN
1000.0000 mL | Freq: Once | INTRAVENOUS | Status: AC
  Administered 2013-01-24: 1000 mL via INTRAVENOUS

## 2013-01-24 MED ORDER — IPRATROPIUM BROMIDE 0.02 % IN SOLN
0.5000 mg | Freq: Once | RESPIRATORY_TRACT | Status: AC
  Administered 2013-01-24: 0.5 mg via RESPIRATORY_TRACT
  Filled 2013-01-24: qty 2.5

## 2013-01-24 MED ORDER — DEXTROSE 5 % IV SOLN
1.0000 g | INTRAVENOUS | Status: DC
  Administered 2013-01-24 – 2013-01-25 (×2): 1 g via INTRAVENOUS
  Filled 2013-01-24 (×3): qty 10

## 2013-01-24 MED ORDER — ALBUTEROL SULFATE (5 MG/ML) 0.5% IN NEBU
2.5000 mg | INHALATION_SOLUTION | Freq: Two times a day (BID) | RESPIRATORY_TRACT | Status: DC
  Administered 2013-01-25 (×2): 2.5 mg via RESPIRATORY_TRACT
  Filled 2013-01-24 (×3): qty 0.5

## 2013-01-24 MED ORDER — ONDANSETRON HCL 4 MG/2ML IJ SOLN: 4.0000 mg | Freq: Four times a day (QID) | INTRAMUSCULAR | Status: DC | PRN

## 2013-01-24 MED ORDER — DEXTROSE 5 % IV SOLN
500.0000 mg | Freq: Once | INTRAVENOUS | Status: AC
  Administered 2013-01-24: 500 mg via INTRAVENOUS

## 2013-01-24 MED ORDER — PREDNISONE 20 MG PO TABS
60.0000 mg | ORAL_TABLET | Freq: Once | ORAL | Status: AC
  Administered 2013-01-24: 60 mg via ORAL
  Filled 2013-01-24: qty 3

## 2013-01-24 MED ORDER — ALBUTEROL SULFATE (5 MG/ML) 0.5% IN NEBU
2.5000 mg | INHALATION_SOLUTION | Freq: Four times a day (QID) | RESPIRATORY_TRACT | Status: DC
  Administered 2013-01-24: 2.5 mg via RESPIRATORY_TRACT
  Filled 2013-01-24: qty 0.5

## 2013-01-24 MED ORDER — ONDANSETRON HCL 4 MG PO TABS: 4.0000 mg | ORAL_TABLET | Freq: Four times a day (QID) | ORAL | Status: DC | PRN

## 2013-01-24 MED ORDER — ACETAMINOPHEN 325 MG PO TABS: 650.0000 mg | ORAL_TABLET | Freq: Once | ORAL | Status: DC

## 2013-01-24 MED ORDER — DEXTROSE 5 % IV SOLN
1.0000 g | Freq: Once | INTRAVENOUS | Status: AC
  Administered 2013-01-24: 1 g via INTRAVENOUS
  Filled 2013-01-24: qty 10

## 2013-01-24 MED ORDER — SODIUM CHLORIDE 0.9 % IV SOLN: 1000.0000 mL | Freq: Once | INTRAVENOUS | Status: DC

## 2013-01-24 MED ORDER — ALBUTEROL SULFATE HFA 108 (90 BASE) MCG/ACT IN AERS: 2.0000 | INHALATION_SPRAY | Freq: Four times a day (QID) | RESPIRATORY_TRACT | Status: DC | PRN

## 2013-01-24 MED ORDER — ALBUTEROL SULFATE (5 MG/ML) 0.5% IN NEBU: 2.5000 mg | INHALATION_SOLUTION | RESPIRATORY_TRACT | Status: DC | PRN

## 2013-01-24 MED ORDER — HEPARIN SODIUM (PORCINE) 5000 UNIT/ML IJ SOLN
5000.0000 [IU] | Freq: Three times a day (TID) | INTRAMUSCULAR | Status: DC
  Filled 2013-01-24 (×3): qty 1

## 2013-01-24 MED ORDER — DEXTROSE 5 % IV SOLN: 1.0000 g | INTRAVENOUS | Status: DC

## 2013-01-24 MED ORDER — INFLUENZA VAC SPLIT QUAD 0.5 ML IM SUSP
0.5000 mL | INTRAMUSCULAR | Status: DC
  Filled 2013-01-24: qty 0.5

## 2013-01-24 MED ORDER — AZITHROMYCIN 500 MG PO TABS
500.0000 mg | ORAL_TABLET | ORAL | Status: DC
  Administered 2013-01-24 – 2013-01-26 (×3): 500 mg via ORAL
  Filled 2013-01-24 (×3): qty 1

## 2013-01-24 MED ORDER — ADULT MULTIVITAMIN W/MINERALS CH
1.0000 | ORAL_TABLET | Freq: Every day | ORAL | Status: DC
  Administered 2013-01-24 – 2013-01-25 (×2): 1 via ORAL
  Filled 2013-01-24 (×3): qty 1

## 2013-01-24 NOTE — ED Notes (Signed)
The pt feels better and her pain has lessened.  Friend at the bedside still

## 2013-01-24 NOTE — ED Notes (Signed)
Report  Given to 5 w rn rachel

## 2013-01-24 NOTE — Progress Notes (Signed)
ANTIBIOTIC CONSULT NOTE - INITIAL  Pharmacy Consult for Rocephin and Azithromycin Indication: CAP  Allergies  Allergen Reactions  . Pantoprazole Sodium     Patient Measurements: Weight: 203 lb (92.08 kg)  Vital Signs: Temp: 100.5 F (38.1 C) (11/29 1327) Temp src: Oral (11/29 1327) BP: 133/58 mmHg (11/29 1327) Pulse Rate: 102 (11/29 1327) Intake/Output from previous day:   Intake/Output from this shift:    Labs: No results found for this basename: WBC, HGB, PLT, LABCREA, CREATININE,  in the last 72 hours The CrCl is unknown because both a height and weight (above a minimum accepted value) are required for this calculation. No results found for this basename: VANCOTROUGH, VANCOPEAK, VANCORANDOM, GENTTROUGH, GENTPEAK, GENTRANDOM, TOBRATROUGH, TOBRAPEAK, TOBRARND, AMIKACINPEAK, AMIKACINTROU, AMIKACIN,  in the last 72 hours   Microbiology: No results found for this or any previous visit (from the past 720 hour(s)).  Medical History: Past Medical History  Diagnosis Date  . Low back pain   . GERD (gastroesophageal reflux disease)   . Hypertension   . Osteoarthritis   . Sinus trouble   . Colon polyp 10/06/2003  . B12 deficiency     Medications:  See electronic med rec  Assessment: 62 y.o. female presents from urgent care with SOB, chest pain. CXR revealed LLL PNA. To begin CAP treatment with Rocephin and Azithromycin.  Goal of Therapy:  Eradication of infection  Plan:  1. Rocephin 1gm IV q24h. 2. Azithromycin 500mg  IV q24h. 3. Will f/u micro data, pt's clinical condition 4. Pharmacy will sign off unless other antibiotics are ordered upon admit as Rocephin and Azithromycin do not require any renal adjustments. Please reconsult if needed.  Christoper Fabian, PharmD, BCPS Clinical pharmacist, pager [phone] 01/24/2013,2:16 PM

## 2013-01-24 NOTE — ED Notes (Signed)
2 sets of cultures obtained; pt placed on bedpan for urine sample.

## 2013-01-24 NOTE — ED Notes (Signed)
Pt masked.

## 2013-01-24 NOTE — Progress Notes (Signed)
4:45 PM attempted to get report for transfer to 5W28.  4:57 PM Report received from [facility], [place].

## 2013-01-24 NOTE — H&P (Signed)
[facility] History and Physical  Brandy Dunlap ZOX:[id number] DOB: 28-Nov-1950 DOA: 01/24/2013  Referring physician: Dr. Fonnie Jarvis PCP: Carrie Mew, MD  Specialists: none  Chief Complaint: SOB  HPI: Brandy Dunlap is a 62 y.o. female  With past medical history of hypertension that comes in for fever and started 4 days prior to admission progressively getting worst. We'll also decrease oral intake with dizziness upon standing. Some shortness of breath with exertion. Cough that started 3 days prior to admission progressively getting worse. She relates she took her temperature at home and it was 103.0 which improves with Advil.  Review of Systems: The patient denies , weight loss,, vision loss, decreased hearing, hoarseness, chest pain, syncope,  peripheral edema, balance deficits, hemoptysis, abdominal pain, melena, hematochezia, severe indigestion/heartburn, hematuria, incontinence, genital sores, muscle weakness, suspicious skin lesions, transient blindness, difficulty walking, depression, unusual weight change, abnormal bleeding, enlarged lymph nodes, angioedema, and breast masses.    Past Medical History  Diagnosis Date  . Low back pain   . GERD (gastroesophageal reflux disease)   . Hypertension   . Osteoarthritis   . Sinus trouble   . Colon polyp 10/06/2003  . B12 deficiency    Past Surgical History  Procedure Laterality Date  . Lumbar fusion  2008  . Cholecystectomy    . Tonsillectomy    . Partial hip arthroplasty Right 2009     hip replacement  . Colonoscopy w/ biopsies  10/06/2003  . Video bronchoscopy Bilateral 12/05/2012    Procedure: VIDEO BRONCHOSCOPY WITHOUT FLUORO;  Surgeon: Leslye Peer, MD;  Location: [facility];  Service: Cardiopulmonary;  Laterality: Bilateral;   Social History:  reports that she has never smoked. She has never used smokeless tobacco. She reports that she does not drink alcohol or use illicit drugs. Lives at home with  family  Allergies  Allergen Reactions  . Pantoprazole Sodium Other (See Comments)    Racing heart    Family History  Problem Relation Age of Onset  . COPD Father     died at age 1  . Emphysema Father   . Heart disease Brother     stent with 90%  . COPD Mother   . Heart disease Mother   . Ovarian cancer Mother   . Emphysema Mother     Prior to Admission medications   Medication Sig Start Date End Date Taking? Authorizing Provider  dexlansoprazole (DEXILANT) 60 MG capsule Take 60 mg by mouth daily.   Yes Historical Provider, MD  estrogen, conjugated,-medroxyprogesterone (PREMPRO) 0.45-1.5 MG per tablet Take 1 tablet by mouth daily. 10/03/12 10/03/13 Yes Stacie Glaze, MD  gabapentin (NEURONTIN) 300 MG capsule Take 300 mg by mouth 2 (two) times daily.    Yes Historical Provider, MD  Multiple Vitamin (MULTIVITAMIN) capsule Take 1 capsule by mouth daily.     Yes Historical Provider, MD  telmisartan (MICARDIS) 80 MG tablet Take 1 tablet (80 mg total) by mouth daily. 10/03/12  Yes Stacie Glaze, MD  albuterol (PROVENTIL HFA;VENTOLIN HFA) 108 (90 BASE) MCG/ACT inhaler Inhale 2 puffs into the lungs every 6 (six) hours as needed for wheezing. 10/03/12   Stacie Glaze, MD   Physical Exam: Filed Vitals:   01/24/13 1623  BP: 129/50  Pulse: 108  Temp:   Resp: 20    BP 129/50  Pulse 108  Temp(Src) 100.5 F (38.1 C) (Oral)  Resp 20  Wt 92.08 kg (203 lb)  SpO2 97%  General Appearance:  Alert, cooperative, no distress, appears stated age              Throat:   Lips, mucosa, and tongue dry  Neck:   Supple, symmetrical, trachea midline, no adenopathy;    thyroid:  no  JVD     Lungs:     and movement with crackles on the left       Heart:    Regular rate and rhythm, S1 and S2 normal, no murmur, rub   or gallop     Abdomen:     Soft, non-tender, bowel sounds active all four quadrants,    no masses, no organomegaly        Extremities:   Extremities normal, atraumatic, no  cyanosis or edema  Pulses:   2+ and symmetric all extremities        Neurologic:   CNII-XII intact, normal strength, sensation and reflexes    throughout     Labs on Admission:  Basic Metabolic Panel:  Recent Labs Lab 01/24/13 1356  NA 133*  K 3.9  CL 98  CO2 21  GLUCOSE 95  BUN 38*  CREATININE 3.50*  CALCIUM 8.6   Liver Function Tests:  Recent Labs Lab 01/24/13 1356  AST 13  ALT 10  ALKPHOS 97  BILITOT 0.4  PROT 7.1  ALBUMIN 2.9*   No results found for this basename: LIPASE, AMYLASE,  in the last 168 hours No results found for this basename: AMMONIA,  in the last 168 hours CBC:  Recent Labs Lab 01/24/13 1356  WBC 13.5*  NEUTROABS 10.4*  HGB 12.2  HCT 35.0*  MCV 84.1  PLT 258   Cardiac Enzymes: No results found for this basename: CKTOTAL, CKMB, CKMBINDEX, TROPONINI,  in the last 168 hours  BNP (last 3 results) No results found for this basename: PROBNP,  in the last 8760 hours CBG: No results found for this basename: GLUCAP,  in the last 168 hours  Radiological Exams on Admission: Dg Chest 2 View  01/24/2013   CLINICAL DATA:  Fever of unknown origin, chronic cough  EXAM: CHEST  2 VIEW  COMPARISON:  04/26/2002  FINDINGS: Left perihilar/lower lobe patchy opacity, suspicious for pneumonia. No pleural effusion or pneumothorax.  The heart is normal in size.  Mild degenerative changes of the visualized thoracolumbar spine.  Cholecystectomy clips.  IMPRESSION: Left perihilar/lower lobe opacity, suspicious for pneumonia.   Electronically Signed   By: Charline Bills M.D.   On: 01/24/2013 12:11    EKG: Independently reviewed. Sinus tach with right bundle branch block  Assessment/Plan CAP (community acquired pneumonia)/ Sepsis - I agree with antibiotics Rocephin and azithromycin, data urine Legionella, sputum cultures, use albuterol when necessary, she's currently satting greater than 90% on room air head without wheezing on physical exam. Check an HIV and  an occlusive PCR. - Tylenol for fevers, daily weights and a regular diet we'll monitor strict I's and O's.  AKI (acute kidney injury) - We'll go ahead and hold her ARB which is probably contributing to her acute kidney injury. Will start her on IV fluids aggressively measure strict I.'s and O.'s and recheck a basic metabolic panel in the morning.  HYPERTENSION - Hold ARB II Use hydralazine when necessary for blood pressure greater than 180.    Code Status: full Family Communication: sister Disposition Plan: inpatient  Time spent: 80 minutes  Marinda Elk [facility] Pager [phone]  If 7PM-7AM, please contact night-coverage [email or web address] Password [email or web address] 01/24/2013, 4:55 PM

## 2013-01-24 NOTE — ED Notes (Signed)
The pt is c/o chest pain and when she coughs.

## 2013-01-24 NOTE — ED Provider Notes (Signed)
CSN: [id number]     Arrival date & time 01/24/13  1008 History   None    Chief Complaint  Patient presents with  . Cough   (Consider location/radiation/quality/duration/timing/severity/associated sxs/prior Treatment) Patient is a 62 y.o. female presenting with cough.  Cough Associated symptoms: chest pain, chills, fever, shortness of breath and sore throat   Associated symptoms: no diaphoresis, no ear pain, no headaches and no wheezing    The patient reports onset of "not feeling well" this past Tuesday. Patient states she in running a fever that evening with a high of 103.3.  And states she has had continuous fever for several days which she has been treating with Advil.  In addition the patient states that she had some dizziness yesterday which caused her to fall. He should also reports nonradiating left anterior chest pain. Denies nausea, vomiting, or diarrhea.  Patient reports positive chills with fever, but denies diaphoresis.  Past Medical History  Diagnosis Date  . Low back pain   . GERD (gastroesophageal reflux disease)   . Hypertension   . Osteoarthritis   . Sinus trouble   . Colon polyp 10/06/2003  . B12 deficiency    Past Surgical History  Procedure Laterality Date  . Lumbar fusion  2008  . Cholecystectomy    . Tonsillectomy    . Partial hip arthroplasty Right 2009     hip replacement  . Colonoscopy w/ biopsies  10/06/2003  . Video bronchoscopy Bilateral 12/05/2012    Procedure: VIDEO BRONCHOSCOPY WITHOUT FLUORO;  Surgeon: Leslye Peer, MD;  Location: [facility];  Service: Cardiopulmonary;  Laterality: Bilateral;   Family History  Problem Relation Age of Onset  . COPD Father     died at age 67  . Emphysema Father   . Heart disease Brother     stent with 90%  . COPD Mother   . Heart disease Mother   . Ovarian cancer Mother   . Emphysema Mother    History  Substance Use Topics  . Smoking status: Never Smoker   . Smokeless tobacco: Never Used  . Alcohol  Use: No   OB History   Grav Para Term Preterm Abortions TAB SAB Ect Mult Living                 Review of Systems  Constitutional: Positive for fever, chills, activity change and fatigue. Negative for diaphoresis.  HENT: Positive for sore throat. Negative for congestion, ear pain and sinus pressure.        The patient states that there is a growth that was found in her trachea that was originally biopsied and found to be negative.  The patient states the growth has caused persistent cough which is treated with Neurontin. Patient states a second biopsy is scheduled for March 06, 2013.  Eyes: Negative.   Respiratory: Positive for cough, chest tightness and shortness of breath. Negative for wheezing.   Cardiovascular: Positive for chest pain. Negative for palpitations.  Gastrointestinal: Negative for nausea, vomiting, abdominal pain and diarrhea.  Endocrine: Negative.   Genitourinary: Negative.   Musculoskeletal: Negative.   Skin: Negative.   Allergic/Immunologic: Negative.   Neurological: Positive for dizziness, weakness and light-headedness. Negative for speech difficulty and headaches.       The patient denies and sensation of "spinning" or movement of room with rapid movement of head.    Hematological: Negative.   Psychiatric/Behavioral: Negative.     Allergies  Pantoprazole sodium  Home Medications   Current Outpatient  Rx  Name  Route  Sig  Dispense  Refill  . albuterol (PROVENTIL HFA;VENTOLIN HFA) 108 (90 BASE) MCG/ACT inhaler   Inhalation   Inhale 2 puffs into the lungs every 6 (six) hours as needed for wheezing.   1 Inhaler   0   . estrogen, conjugated,-medroxyprogesterone (PREMPRO) 0.45-1.5 MG per tablet   Oral   Take 1 tablet by mouth daily.   90 tablet   3   . gabapentin (NEURONTIN) 300 MG capsule   Oral   Take 300 mg by mouth 3 (three) times daily.         [name] loratadine (CLARITIN) 10 MG tablet   Oral   Take 1 tablet (10 mg total) by mouth daily.   30  tablet   3   . Multiple Vitamin (MULTIVITAMIN) capsule   Oral   Take 1 capsule by mouth daily.           [name] telmisartan (MICARDIS) 80 MG tablet   Oral   Take 1 tablet (80 mg total) by mouth daily.   90 tablet   3    BP 117/60  Pulse 96  Temp(Src) 99.6 F (37.6 C) (Oral)  Resp 19  SpO2 97%  Physical Exam  Nursing note and vitals reviewed. Constitutional: She is oriented to person, place, and time. She appears well-developed and well-nourished. No distress.  HENT:  Head: Normocephalic and atraumatic.  Right Ear: External ear normal.  Left Ear: External ear normal.  Neck: Normal range of motion. Neck supple. No tracheal deviation present.  Mild anterior cervical lymphadenopathy present.  Cardiovascular: Normal rate, regular rhythm, normal heart sounds and intact distal pulses.  Exam reveals no gallop and no friction rub.   No murmur heard. No pedal or ankle edema noted.    Pulmonary/Chest: Effort normal. No respiratory distress. She has decreased breath sounds. She has wheezes in the right lower field and the left lower field. She has no rhonchi. She has no rales.    Mild to moderate expiratory wheezes noted in bilateral bases. E to A changes noted with egophony in left lower lobes.  Musculoskeletal: Normal range of motion. She exhibits no edema and no tenderness.  Lymphadenopathy:    She has cervical adenopathy.  Neurological: She is alert and oriented to person, place, and time. She exhibits normal muscle tone. Coordination normal.  Skin: Skin is warm and dry. No rash noted. She is not diaphoretic. No erythema. No pallor.  Psychiatric: She has a normal mood and affect. Her behavior is normal.   On reevaluation prior to transfer, patient has begun to shiver, fever appears to be returning.   ED Course  Procedures (including critical care time) Labs Review Labs Reviewed  POCT RAPID STREP A ([facility] CARE ONLY)   Imaging Review Dg Chest 2 View  01/24/2013   CLINICAL  DATA:  Fever of unknown origin, chronic cough  EXAM: CHEST  2 VIEW  COMPARISON:  04/26/2002  FINDINGS: Left perihilar/lower lobe patchy opacity, suspicious for pneumonia. No pleural effusion or pneumothorax.  The heart is normal in size.  Mild degenerative changes of the visualized thoracolumbar spine.  Cholecystectomy clips.  IMPRESSION: Left perihilar/lower lobe opacity, suspicious for pneumonia.   Electronically Signed   By: Charline Bills M.D.   On: 01/24/2013 12:11         EKG Interpretation  Date/Time: 11:56:51 24-Jan-2013 Ventricular Rate: 89 bpm PR Interval: 118 ms QRS duration: 84 ms QT/QTc 334/406 ms P-R-T axes 56   -  2  39 Text Interpretation:  Normal Sinus Rhythm, Nonspecific ST abnormality, Abnormal EKG  MDM   1. Community acquired pneumonia    Plan of care discussed with Dr. Denyse Amass.  Patient to be transferred to [facility] ED for further evaluation.    Weber Cooks, NP 01/24/13 1236  Weber Cooks, NP 01/24/13 1238

## 2013-01-24 NOTE — ED Notes (Signed)
Pt sent here by [facility] for eval of st changes to EKG.  Dx with pnx at [facility] today after c/o sob and L chest pain.

## 2013-01-24 NOTE — ED Notes (Signed)
Pt  Reports  Symptoms  Of  Cough        Fever    As  Well   As  Some  Malaise    X  3  Days    She  Reports    Pain l  Side chest  Worse  When  She  Takes  A  Deep  Breath          She  Reports    Symptoms  Of a  sorethroat  As  Well

## 2013-01-24 NOTE — Progress Notes (Signed)
Brandy Dunlap [id number] Code Status: FULL  Admission Data: 01/24/2013 6:08 PM Attending Provider:  David Stall MD WUJ:[id number],WGNF [name], MD Consults/ Treatment Team:    Brandy Dunlap is a 62 y.o. female patient admitted from ED awake, alert - oriented  X 3 - no acute distress noted.  VSS - Blood pressure 125/75, pulse 102, temperature 99.5 F (37.5 C), temperature source Oral, resp. rate 18, height 5\' 3"  (1.6 m), weight 86.002 kg (189 lb 9.6 oz), SpO2 100.00%.  no c/o shortness of breath, no c/o chest pain.  IV Fluids:  IV in place, occlusive dsg intact without redness, IV cath forearm left, condition patent and no redness normal saline.  Allergies:   Allergies  Allergen Reactions  . Pantoprazole Sodium Other (See Comments)    Racing heart     Past Medical History  Diagnosis Date  . Low back pain   . GERD (gastroesophageal reflux disease)   . Hypertension   . Osteoarthritis   . Sinus trouble   . Colon polyp 10/06/2003  . B12 deficiency    Medications Prior to Admission  Medication Sig Dispense Refill  . dexlansoprazole (DEXILANT) 60 MG capsule Take 60 mg by mouth daily.      [name] estrogen, conjugated,-medroxyprogesterone (PREMPRO) 0.45-1.5 MG per tablet Take 1 tablet by mouth daily.  90 tablet  3  . gabapentin (NEURONTIN) 300 MG capsule Take 300 mg by mouth 2 (two) times daily.       . Multiple Vitamin (MULTIVITAMIN) capsule Take 1 capsule by mouth daily.        [name] telmisartan (MICARDIS) 80 MG tablet Take 1 tablet (80 mg total) by mouth daily.  90 tablet  3  . albuterol (PROVENTIL HFA;VENTOLIN HFA) 108 (90 BASE) MCG/ACT inhaler Inhale 2 puffs into the lungs every 6 (six) hours as needed for wheezing.  1 Inhaler  0   History:  obtained from the patient. Tobacco/alcohol: denied none  Orientation to room, and floor completed with information packet given to patient/family.  Patient declined safety video at this time.  Admission INP armband ID verified with patient/family, and  in place.   SR up x 2, fall assessment complete, with patient and family able to verbalize understanding of risk associated with falls, and verbalized understanding to call nsg before up out of bed.  Call light within reach, patient able to voice, and demonstrate understanding.  Skin, clean-dry- intact without evidence of bruising, or skin tears.   No evidence of skin break down noted on exam.     Will cont to eval and treat per MD orders.  Aldean Ast, RN 01/24/2013 6:08 PM

## 2013-01-24 NOTE — ED Provider Notes (Signed)
CSN: [id number]     Arrival date & time 01/24/13  1314 History   First MD Initiated Contact with Patient 01/24/13 1339     Chief Complaint  Patient presents with  . Shortness of Breath  . Chest Pain   (Consider location/radiation/quality/duration/timing/severity/associated sxs/prior Treatment) HPI This 62 year old female has 4 days of fever to 103 cough chills body aches fatigue generalized weakness dehydration decreased urine output constant chest pain worse with cough and mild shortness of breath intermittently with no confusion no rash no vomiting no diarrhea no treatment prior to arrival other than chest x-ray from urgent care which revealed left-sided lower pneumonia and patient was transferred to the emergency department for further evaluation. Past Medical History  Diagnosis Date  . Low back pain   . GERD (gastroesophageal reflux disease)   . Hypertension   . Osteoarthritis   . Sinus trouble   . Colon polyp 10/06/2003  . B12 deficiency    Past Surgical History  Procedure Laterality Date  . Lumbar fusion  2008  . Cholecystectomy    . Tonsillectomy    . Partial hip arthroplasty Right 2009     hip replacement  . Colonoscopy w/ biopsies  10/06/2003  . Video bronchoscopy Bilateral 12/05/2012    Procedure: VIDEO BRONCHOSCOPY WITHOUT FLUORO;  Surgeon: Leslye Peer, MD;  Location: [facility];  Service: Cardiopulmonary;  Laterality: Bilateral;   Family History  Problem Relation Age of Onset  . COPD Father     died at age 30  . Emphysema Father   . Heart disease Brother     stent with 90%  . COPD Mother   . Heart disease Mother   . Ovarian cancer Mother   . Emphysema Mother    History  Substance Use Topics  . Smoking status: Never Smoker   . Smokeless tobacco: Never Used  . Alcohol Use: No   OB History   Grav Para Term Preterm Abortions TAB SAB Ect Mult Living                 Review of Systems 10 Systems reviewed and are negative for acute change except as  noted in the HPI. Allergies  Pantoprazole sodium  Home Medications   No current outpatient prescriptions on file. BP 105/63  Pulse 85  Temp(Src) 98.2 F (36.8 C) (Oral)  Resp 18  Ht 5\' 3"  (1.6 m)  Wt 189 lb 9.6 oz (86.002 kg)  BMI 33.59 kg/m2  SpO2 96% Physical Exam  Nursing note and vitals reviewed. Constitutional:  Awake, alert, nontoxic appearance.  HENT:  Head: Atraumatic.  Eyes: Right eye exhibits no discharge. Left eye exhibits no discharge.  Neck: Neck supple.  Cardiovascular: Regular rhythm.   No murmur heard. Tachycardic  Pulmonary/Chest: She is in respiratory distress. She has wheezes. She has rales. She exhibits tenderness.  Mild respiratory distress at rest, speaks full sentences, pulse oximetry normal on room air at 97%, left-sided posterior lung fields reveal crackles rhonchi and wheezing with no retractions no accessory muscle usage; mild diffuse tenderness to chest wall without rash  Abdominal: Soft. Bowel sounds are normal. She exhibits no distension and no mass. There is no tenderness. There is no rebound and no guarding.  Musculoskeletal: She exhibits tenderness. She exhibits no edema.  Baseline ROM, no obvious new focal weakness. Mild diffuse tenderness to back in all 4 extremities  Neurological:  Mental status and motor strength appears baseline for patient and situation.  Skin: No rash noted.  Psychiatric: She  has a normal mood and affect.    ED Course  Procedures (including critical care time) Patient / Family / Caregiver informed of clinical course, understand medical decision-making process, and agree with plan.d/w [facility] for admit. Labs Review Labs Reviewed  CBC WITH DIFFERENTIAL - Abnormal; Notable for the following:    WBC 13.5 (*)    HCT 35.0 (*)    Neutro Abs 10.4 (*)    Lymphocytes Relative 8 (*)    Monocytes Relative 14 (*)    Monocytes Absolute 1.9 (*)    All other components within normal limits  COMPREHENSIVE METABOLIC PANEL -  Abnormal; Notable for the following:    Sodium 133 (*)    BUN 38 (*)    Creatinine, Ser 3.50 (*)    Albumin 2.9 (*)    GFR calc non Af Amer 13 (*)    GFR calc Af Amer 15 (*)    All other components within normal limits  URINALYSIS, ROUTINE W REFLEX MICROSCOPIC - Abnormal; Notable for the following:    APPearance CLOUDY (*)    Hgb urine dipstick MODERATE (*)    Protein, ur 30 (*)    All other components within normal limits  URINE MICROSCOPIC-ADD ON - Abnormal; Notable for the following:    Squamous Epithelial / LPF FEW (*)    Casts HYALINE CASTS (*)    All other components within normal limits  CULTURE, GROUP A STREP  URINE CULTURE  CULTURE, BLOOD (ROUTINE X 2)  CULTURE, BLOOD (ROUTINE X 2)  CULTURE, EXPECTORATED SPUTUM-ASSESSMENT  GRAM STAIN  LEGIONELLA ANTIGEN, URINE  CBC  BASIC METABOLIC PANEL  INFLUENZA PANEL BY PCR  HIV ANTIBODY (ROUTINE TESTING)  CG4 I-STAT (LACTIC ACID)  POCT I-STAT TROPONIN I   Imaging Review Dg Chest 2 View  01/24/2013   CLINICAL DATA:  Fever of unknown origin, chronic cough  EXAM: CHEST  2 VIEW  COMPARISON:  04/26/2002  FINDINGS: Left perihilar/lower lobe patchy opacity, suspicious for pneumonia. No pleural effusion or pneumothorax.  The heart is normal in size.  Mild degenerative changes of the visualized thoracolumbar spine.  Cholecystectomy clips.  IMPRESSION: Left perihilar/lower lobe opacity, suspicious for pneumonia.   Electronically Signed   By: Charline Bills M.D.   On: 01/24/2013 12:11    EKG Interpretation    Date/Time:  Saturday January 24 2013 13:25:00 EST Ventricular Rate:  104 PR Interval:  131 QRS Duration: 124 QT Interval:  367 QTC Calculation: 483 R Axis:   71 Text Interpretation:  Sinus tachycardia IVCD, consider atypical RBBB Consider anterior infarct No significant change since last tracing Confirmed by [facility]  MD, Alayha Babineaux ([id number]) on 01/24/2013 1:55:53 PM            MDM   1. CAP (community acquired pneumonia)    2. Renal insufficiency   3. AKI (acute kidney injury)   4. Sepsis   5. Unspecified essential hypertension    The patient appears reasonably stabilized for admission considering the current resources, flow, and capabilities available in the ED at this time, and I doubt any other [facility] requiring further screening and/or treatment in the ED prior to admission.    Hurman Horn, MD 01/25/13 0300

## 2013-01-24 NOTE — ED Provider Notes (Signed)
Medical screening examination/treatment/procedure(s) were performed by a resident physician or non-physician practitioner and as the supervising physician I was immediately available for consultation/collaboration.  Clementeen Graham, MD    Rodolph Bong, MD 01/24/13 [phone]

## 2013-01-24 NOTE — ED Notes (Signed)
The pt appears comfortable.  Visiting a friend

## 2013-01-24 NOTE — ED Notes (Signed)
2nd liter hung nss

## 2013-01-25 DIAGNOSIS — K219 Gastro-esophageal reflux disease without esophagitis: Secondary | ICD-10-CM

## 2013-01-25 DIAGNOSIS — R059 Cough, unspecified: Secondary | ICD-10-CM

## 2013-01-25 DIAGNOSIS — R05 Cough: Secondary | ICD-10-CM

## 2013-01-25 LAB — URINE CULTURE: Colony Count: 40000

## 2013-01-25 LAB — LEGIONELLA ANTIGEN, URINE: Legionella Antigen, Urine: NEGATIVE

## 2013-01-25 LAB — CBC
HCT: 29.1 % — ABNORMAL LOW (ref 36.0–46.0)
Hemoglobin: 10.4 g/dL — ABNORMAL LOW (ref 12.0–15.0)
MCH: 29.5 pg (ref 26.0–34.0)
MCHC: 35.7 g/dL (ref 30.0–36.0)
MCV: 82.7 fL (ref 78.0–100.0)
Platelets: 253 10*3/uL (ref 150–400)
RBC: 3.52 MIL/uL — ABNORMAL LOW (ref 3.87–5.11)
RDW: 15.2 % (ref 11.5–15.5)
WBC: 10.4 10*3/uL (ref 4.0–10.5)

## 2013-01-25 LAB — INFLUENZA PANEL BY PCR (TYPE A & B)
H1N1 flu by pcr: NOT DETECTED
Influenza A By PCR: NEGATIVE
Influenza B By PCR: NEGATIVE

## 2013-01-25 LAB — BASIC METABOLIC PANEL
BUN: 25 mg/dL — ABNORMAL HIGH (ref 6–23)
CO2: 18 mEq/L — ABNORMAL LOW (ref 19–32)
Calcium: 8.2 mg/dL — ABNORMAL LOW (ref 8.4–10.5)
Chloride: 109 mEq/L (ref 96–112)
Creatinine, Ser: 1.51 mg/dL — ABNORMAL HIGH (ref 0.50–1.10)
GFR calc Af Amer: 42 mL/min — ABNORMAL LOW (ref >90)
GFR calc non Af Amer: 36 mL/min — ABNORMAL LOW (ref >90)
Glucose, Bld: 124 mg/dL — ABNORMAL HIGH (ref 70–99)
Potassium: 3.4 mEq/L — ABNORMAL LOW (ref 3.5–5.1)
Sodium: 141 mEq/L (ref 135–145)

## 2013-01-25 LAB — CLOSTRIDIUM DIFFICILE BY PCR: Toxigenic C. Difficile by PCR: NEGATIVE

## 2013-01-25 LAB — HIV ANTIBODY (ROUTINE TESTING W REFLEX): HIV: NONREACTIVE

## 2013-01-25 MED ORDER — LOPERAMIDE HCL 2 MG PO CAPS
4.0000 mg | ORAL_CAPSULE | ORAL | Status: DC | PRN
  Administered 2013-01-25 – 2013-01-26 (×2): 4 mg via ORAL
  Filled 2013-01-25 (×3): qty 2

## 2013-01-25 MED ORDER — POTASSIUM CHLORIDE CRYS ER 20 MEQ PO TBCR
40.0000 meq | EXTENDED_RELEASE_TABLET | Freq: Four times a day (QID) | ORAL | Status: AC
  Administered 2013-01-25 (×2): 40 meq via ORAL
  Filled 2013-01-25 (×2): qty 2

## 2013-01-25 MED ORDER — GUAIFENESIN ER 600 MG PO TB12
1200.0000 mg | ORAL_TABLET | Freq: Two times a day (BID) | ORAL | Status: DC
  Administered 2013-01-25: 1200 mg via ORAL
  Filled 2013-01-25 (×4): qty 2

## 2013-01-25 MED ORDER — GUAIFENESIN-DM 100-10 MG/5ML PO SYRP
5.0000 mL | ORAL_SOLUTION | ORAL | Status: DC | PRN
  Administered 2013-01-25 – 2013-01-26 (×3): 5 mL via ORAL
  Filled 2013-01-25 (×3): qty 5

## 2013-01-25 NOTE — Progress Notes (Signed)
[facility] PROGRESS NOTE  Brandy Dunlap WJX:[id number] DOB: [birth year] DOA: 01/24/2013 PCP: Carrie Mew, MD  HPI/Subjective: Complaining about dry nonproductive cough. Otherwise her shortness of breath is better.  Assessment/Plan: Principal Problem:   CAP (community acquired pneumonia) Active Problems:   HYPERTENSION   AKI (acute kidney injury)   Sepsis   Community acquired pneumonia -Chest x-ray showed left lower lobe pneumonia. -Patient started on Rocephin and azithromycin. -Supportive management with antitussives, mucolytics, bronchodilators and oxygen.  Acute renal failure -Patient presented with creatinine of 3.5, likely secondary to dehydration and ARB. -Started on aggressive hydration with IV fluids, her Cozaar held. -Creatinine is already improving today is 1.5, continue IV fluids.  Hypertension -Hold Cozaar for now.  Code Status: Full code Family Communication: Plan discussed with the patient. Disposition Plan: Remains inpatient   Consultants:  None  Procedures:  None  Antibiotics:  None   Objective: Filed Vitals:   01/25/13 0542  BP: 114/74  Pulse: 82  Temp: 98.8 F (37.1 C)  Resp: 18    Intake/Output Summary (Last 24 hours) at 01/25/13 1044 Last data filed at 01/24/13 1922  Gross per 24 hour  Intake   2222 ml  Output      0 ml  Net   2222 ml   Filed Weights   01/24/13 1321 01/24/13 1740 01/25/13 0651  Weight: 92.08 kg (203 lb) 86.002 kg (189 lb 9.6 oz) 85.9 kg (189 lb 6 oz)    Exam: General: Alert and awake, oriented x3, not in any acute distress. HEENT: anicteric sclera, pupils reactive to light and accommodation, EOMI CVS: S1-S2 clear, no murmur rubs or gallops Chest: clear to auscultation bilaterally, no wheezing, rales or rhonchi Abdomen: soft nontender, nondistended, normal bowel sounds, no organomegaly Extremities: no cyanosis, clubbing or edema noted bilaterally Neuro: Cranial nerves II-XII intact, no  focal neurological deficits  Data Reviewed: Basic Metabolic Panel:  Recent Labs Lab 01/24/13 1356 01/25/13 0600  NA 133* 141  K 3.9 3.4*  CL 98 109  CO2 21 18*  GLUCOSE 95 124*  BUN 38* 25*  CREATININE 3.50* 1.51*  CALCIUM 8.6 8.2*   Liver Function Tests:  Recent Labs Lab 01/24/13 1356  AST 13  ALT 10  ALKPHOS 97  BILITOT 0.4  PROT 7.1  ALBUMIN 2.9*   No results found for this basename: LIPASE, AMYLASE,  in the last 168 hours No results found for this basename: AMMONIA,  in the last 168 hours CBC:  Recent Labs Lab 01/24/13 1356 01/25/13 0600  WBC 13.5* 10.4  NEUTROABS 10.4*  --   HGB 12.2 10.4*  HCT 35.0* 29.1*  MCV 84.1 82.7  PLT 258 253   Cardiac Enzymes: No results found for this basename: CKTOTAL, CKMB, CKMBINDEX, TROPONINI,  in the last 168 hours BNP (last 3 results) No results found for this basename: PROBNP,  in the last 8760 hours CBG: No results found for this basename: GLUCAP,  in the last 168 hours  Micro Recent Results (from the past 240 hour(s))  CULTURE, BLOOD (ROUTINE X 2)     Status: None   Collection Time    01/24/13  2:15 PM      Result Value Range Status   Specimen Description BLOOD RIGHT ARM   Final   Special Requests BOTTLES DRAWN AEROBIC AND ANAEROBIC 5CC   Final   Culture  Setup Time     Final   Value: 01/24/2013 17:23     Performed at [facility]  Culture     Final   Value:        BLOOD CULTURE RECEIVED NO GROWTH TO DATE CULTURE WILL BE HELD FOR 5 DAYS BEFORE ISSUING A FINAL NEGATIVE REPORT     Performed at [facility]   Report Status PENDING   Incomplete  CULTURE, BLOOD (ROUTINE X 2)     Status: None   Collection Time    01/24/13  2:35 PM      Result Value Range Status   Specimen Description BLOOD LEFT HAND   Final   Special Requests BOTTLES DRAWN AEROBIC AND ANAEROBIC B 10CC R 5CC   Final   Culture  Setup Time     Final   Value: 01/24/2013 17:23     Performed at [facility]   Culture      Final   Value:        BLOOD CULTURE RECEIVED NO GROWTH TO DATE CULTURE WILL BE HELD FOR 5 DAYS BEFORE ISSUING A FINAL NEGATIVE REPORT     Performed at [facility]   Report Status PENDING   Incomplete     Studies: Dg Chest 2 View  01/24/2013   CLINICAL DATA:  Fever of unknown origin, chronic cough  EXAM: CHEST  2 VIEW  COMPARISON:  04/26/2002  FINDINGS: Left perihilar/lower lobe patchy opacity, suspicious for pneumonia. No pleural effusion or pneumothorax.  The heart is normal in size.  Mild degenerative changes of the visualized thoracolumbar spine.  Cholecystectomy clips.  IMPRESSION: Left perihilar/lower lobe opacity, suspicious for pneumonia.   Electronically Signed   By: Charline Bills M.D.   On: 01/24/2013 12:11    Scheduled Meds: . sodium chloride  1,000 mL Intravenous Once  . albuterol  2.5 mg Nebulization BID  . azithromycin  500 mg Oral Q24H  . cefTRIAXone (ROCEPHIN)  IV  1 g Intravenous Q24H  . gabapentin  300 mg Oral BID  . influenza vac split quadrivalent PF  0.5 mL Intramuscular Tomorrow-1000  . multivitamin with minerals  1 tablet Oral Daily   Continuous Infusions: . sodium chloride 125 mL/hr at 01/25/13 0659       Time spent: 35 minutes    [facility]  [facility] Pager [phone] If 7PM-7AM, please contact night-coverage at [email or web address], password [facility] 01/25/2013, 10:44 AM  LOS: 1 day

## 2013-01-25 NOTE — Progress Notes (Signed)
Utilization Review Completed.Brandy Dunlap T11/30/2014  

## 2013-01-26 DIAGNOSIS — R609 Edema, unspecified: Secondary | ICD-10-CM

## 2013-01-26 LAB — BASIC METABOLIC PANEL
BUN: 17 mg/dL (ref 6–23)
CO2: 17 mEq/L — ABNORMAL LOW (ref 19–32)
Calcium: 8.2 mg/dL — ABNORMAL LOW (ref 8.4–10.5)
Chloride: 117 mEq/L — ABNORMAL HIGH (ref 96–112)
Creatinine, Ser: 0.97 mg/dL (ref 0.50–1.10)
GFR calc Af Amer: 71 mL/min — ABNORMAL LOW (ref >90)
GFR calc non Af Amer: 61 mL/min — ABNORMAL LOW (ref >90)
Glucose, Bld: 103 mg/dL — ABNORMAL HIGH (ref 70–99)
Potassium: 4.1 mEq/L (ref 3.5–5.1)
Sodium: 143 mEq/L (ref 135–145)

## 2013-01-26 LAB — CBC
HCT: 28.4 % — ABNORMAL LOW (ref 36.0–46.0)
Hemoglobin: 9.7 g/dL — ABNORMAL LOW (ref 12.0–15.0)
MCH: 28.6 pg (ref 26.0–34.0)
MCHC: 34.2 g/dL (ref 30.0–36.0)
MCV: 83.8 fL (ref 78.0–100.0)
Platelets: 273 10*3/uL (ref 150–400)
RBC: 3.39 MIL/uL — ABNORMAL LOW (ref 3.87–5.11)
RDW: 15.6 % — ABNORMAL HIGH (ref 11.5–15.5)
WBC: 8.1 10*3/uL (ref 4.0–10.5)

## 2013-01-26 LAB — CULTURE, GROUP A STREP

## 2013-01-26 LAB — EXPECTORATED SPUTUM ASSESSMENT W GRAM STAIN, RFLX TO RESP C

## 2013-01-26 LAB — EXPECTORATED SPUTUM ASSESSMENT W REFEX TO RESP CULTURE

## 2013-01-26 MED ORDER — IRBESARTAN 300 MG PO TABS
300.0000 mg | ORAL_TABLET | Freq: Every day | ORAL | Status: DC
  Filled 2013-01-26: qty 1

## 2013-01-26 MED ORDER — LEVOFLOXACIN 750 MG PO TABS: 750.0000 mg | ORAL_TABLET | Freq: Every day | ORAL | Status: DC

## 2013-01-26 MED ORDER — HYDROCODONE-HOMATROPINE 5-1.5 MG/5ML PO SYRP: 5.0000 mL | ORAL_SOLUTION | Freq: Four times a day (QID) | ORAL | Status: DC | PRN

## 2013-01-26 MED ORDER — GUAIFENESIN ER 600 MG PO TB12: 1200.0000 mg | ORAL_TABLET | Freq: Two times a day (BID) | ORAL | Status: DC

## 2013-01-26 NOTE — Progress Notes (Signed)
IV discontinued, discharged instructions reviewed and patient taught back instructions, skin intact, clean and dry.

## 2013-01-26 NOTE — Discharge Summary (Signed)
Addendum  Patient seen and examined, chart and data base reviewed.  I agree with the above assessment and plan.  For full details please see Mrs. Algis Downs PA note.   Clint Lipps, MD [facility] Pager: [phone] 01/26/2013, 1:35 PM

## 2013-01-26 NOTE — Discharge Summary (Signed)
Physician Discharge Summary  Brandy Dunlap UJW:[id number] DOB: 1950/08/16 DOA: 01/24/2013  PCP: Brandy Mew, MD  Admit date: 01/24/2013 Discharge date: 01/26/2013  Time spent: 45 minutes  Recommendations for Outpatient Follow-up:  Will follow up with primary care, Dr. Darryll Dunlap in 1 week for BMET and CBC. Hospitalized with community acquired pneumonia and acute renal failure.  Also noted to be anemic after receiving IVF.   Discharge Diagnoses:  Principal Problem:   CAP (community acquired pneumonia) Active Problems:   HYPERTENSION   AKI (acute kidney injury)   Sepsis   Discharge Condition: stable for discharge to home  Diet recommendation: regular diet  Filed Weights   01/24/13 1321 01/24/13 1740 01/25/13 0651  Weight: 92.08 kg (203 lb) 86.002 kg (189 lb 9.6 oz) 85.9 kg (189 lb 6 oz)    History of present illness:  Brandy Dunlap is a 62 year old female with past medical history of hypertension that comes in for fever x 4 days prior to admission. Temperature at home was 103.  She also had decreased oral intake, dizziness upon standing,  shortness of breath with exertion, and productive cough.  Hospital Course:  Community acquired pneumonia  -Chest x-ray showed left lower lobe pneumonia.  -Started on Rocephin and Azithromycin on 11/29 -Supportive management with antitussives, mucolytics, bronchodilators and oxygen was initiated in hospital. -Patient will be discharged with five days Levaquin. Total antibiotic treatment equals 7 days. -Was told to eat yogurt and keep hydrated with diarrhea from antibiotics. (C. diff negative) -Will discharge with hycodan syrup, and mucinex for cough.  Acute renal failure  -Patient presented with creatinine of 3.5, likely secondary to dehydration and ARB.  -Started on aggressive hydration with IV fluids, her Cozaar was held until the day of discharge. -Creatinine improved. On discharge = 0.97. Continue oral hydration. -See PCP to  reassess kidney function  Hypertension  -Will resume Micardis at discharge   Discharge Exam: Filed Vitals:   01/26/13 1012  BP: 142/80  Pulse: 82  Temp:   Resp:     General: NAD, well appearing, alert and oriented x3, sitting on side of bed eating breakfast. Pleasant. Cardiovascular: RRR, no murmur/gallop/rub Respiratory: CTAB, no signs of consolidation, no crackles, rales, rhonchi Extremities: Full ROM in all four, no edema or erythema.  Discharge Instructions      Discharge Orders   Future Appointments Provider Department Dept Phone   04/20/2013 4:00 PM Brandy Glaze, MD [facility] [phone]   Future Orders Complete By Expires   Diet - low sodium heart healthy  As directed    Increase activity slowly  As directed        Medication List         albuterol 108 (90 BASE) MCG/ACT inhaler  Commonly known as:  PROVENTIL HFA;VENTOLIN HFA  Inhale 2 puffs into the lungs every 6 (six) hours as needed for wheezing.     DEXILANT 60 MG capsule  Generic drug:  dexlansoprazole  Take 60 mg by mouth daily.     estrogen (conjugated)-medroxyprogesterone 0.45-1.5 MG per tablet  Commonly known as:  PREMPRO  Take 1 tablet by mouth daily.     gabapentin 300 MG capsule  Commonly known as:  NEURONTIN  Take 300 mg by mouth 2 (two) times daily.     guaiFENesin 600 MG 12 hr tablet  Commonly known as:  MUCINEX  Take 2 tablets (1,200 mg total) by mouth 2 (two) times daily.     HYDROcodone-homatropine 5-1.5 MG/5ML  syrup  Commonly known as:  HYCODAN  Take 5 mLs by mouth every 6 (six) hours as needed for cough.     levofloxacin 750 MG tablet  Commonly known as:  LEVAQUIN  Take 1 tablet (750 mg total) by mouth daily.     multivitamin capsule  Take 1 capsule by mouth daily.     telmisartan 80 MG tablet  Commonly known as:  MICARDIS  Take 1 tablet (80 mg total) by mouth daily.       Allergies  Allergen Reactions  . Pantoprazole Sodium Other (See  Comments)    Racing heart   Follow-up Information   Follow up with Brandy Mew, MD In 7 days. (follow up in one week.)    Specialty:  Internal Medicine   Contact information:   [address] Christena Flake [facility] [place] [address] [phone]        The results of significant diagnostics from this hospitalization (including imaging, microbiology, ancillary and laboratory) are listed below for reference.    Significant Diagnostic Studies: Dg Chest 2 View  01/24/2013   CLINICAL DATA:  Fever of unknown origin, chronic cough  EXAM: CHEST  2 VIEW  COMPARISON:  04/26/2002  FINDINGS: Left perihilar/lower lobe patchy opacity, suspicious for pneumonia. No pleural effusion or pneumothorax.  The heart is normal in size.  Mild degenerative changes of the visualized thoracolumbar spine.  Cholecystectomy clips.  IMPRESSION: Left perihilar/lower lobe opacity, suspicious for pneumonia.   Electronically Signed   By: Brandy Dunlap M.D.   On: 01/24/2013 12:11    Microbiology: Recent Results (from the past 240 hour(s))  CULTURE, GROUP A STREP     Status: None   Collection Time    01/24/13 11:50 AM      Result Value Range Status   Specimen Description THROAT   Final   Special Requests NONE   Final   Culture     Final   Value: No Beta Hemolytic Streptococci Isolated     Performed at [facility]   Report Status 01/26/2013 FINAL   Final  CULTURE, BLOOD (ROUTINE X 2)     Status: None   Collection Time    01/24/13  2:15 PM      Result Value Range Status   Specimen Description BLOOD RIGHT ARM   Final   Special Requests BOTTLES DRAWN AEROBIC AND ANAEROBIC 5CC   Final   Culture  Setup Time     Final   Value: 01/24/2013 17:23     Performed at [facility]   Culture     Final   Value:        BLOOD CULTURE RECEIVED NO GROWTH TO DATE CULTURE WILL BE HELD FOR 5 DAYS BEFORE ISSUING A FINAL NEGATIVE REPORT     Performed at [facility]   Report Status PENDING   Incomplete   URINE CULTURE     Status: None   Collection Time    01/24/13  2:32 PM      Result Value Range Status   Specimen Description URINE, CLEAN CATCH   Final   Special Requests NONE   Final   Culture  Setup Time     Final   Value: 01/24/2013 17:57     Performed at [facility] Count     Final   Value: 40,000 COLONIES/ML     Performed at [facility]   Culture     Final   Value: Multiple bacterial morphotypes  present, none predominant. Suggest appropriate recollection if clinically indicated.     Performed at [facility]   Report Status 01/25/2013 FINAL   Final  CULTURE, BLOOD (ROUTINE X 2)     Status: None   Collection Time    01/24/13  2:35 PM      Result Value Range Status   Specimen Description BLOOD LEFT HAND   Final   Special Requests BOTTLES DRAWN AEROBIC AND ANAEROBIC B 10CC R 5CC   Final   Culture  Setup Time     Final   Value: 01/24/2013 17:23     Performed at [facility]   Culture     Final   Value:        BLOOD CULTURE RECEIVED NO GROWTH TO DATE CULTURE WILL BE HELD FOR 5 DAYS BEFORE ISSUING A FINAL NEGATIVE REPORT     Performed at [facility]   Report Status PENDING   Incomplete  CLOSTRIDIUM DIFFICILE BY PCR     Status: None   Collection Time    01/25/13  3:31 PM      Result Value Range Status   C difficile by pcr NEGATIVE  NEGATIVE Final  CULTURE, EXPECTORATED SPUTUM-ASSESSMENT     Status: None   Collection Time    01/25/13 10:56 PM      Result Value Range Status   Specimen Description SPUTUM   Final   Special Requests NONE   Final   Sputum evaluation     Final   Value: THIS SPECIMEN IS ACCEPTABLE. RESPIRATORY CULTURE REPORT TO FOLLOW.   Report Status 01/26/2013 FINAL   Final     Labs: Basic Metabolic Panel:  Recent Labs Lab 01/24/13 1356 01/25/13 0600 01/26/13 0558  NA 133* 141 143  K 3.9 3.4* 4.1  CL 98 109 117*  CO2 21 18* 17*  GLUCOSE 95 124* 103*  BUN 38* 25* 17  CREATININE 3.50* 1.51* 0.97   CALCIUM 8.6 8.2* 8.2*   Liver Function Tests:  Recent Labs Lab 01/24/13 1356  AST 13  ALT 10  ALKPHOS 97  BILITOT 0.4  PROT 7.1  ALBUMIN 2.9*   CBC:  Recent Labs Lab 01/24/13 1356 01/25/13 0600 01/26/13 0558  WBC 13.5* 10.4 8.1  NEUTROABS 10.4*  --   --   HGB 12.2 10.4* 9.7*  HCT 35.0* 29.1* 28.4*  MCV 84.1 82.7 83.8  PLT 258 253 273       Signed:  Georgina Quint, PA-S [facility], [place] [phone]  [facility] 01/26/2013, 12:00 PM

## 2013-01-26 NOTE — Care Management Note (Signed)
    Page 1 of 1   01/26/2013     12:24:30 PM   CARE MANAGEMENT NOTE 01/26/2013  Patient:  Brandy Dunlap, Brandy Dunlap   Account Number:  [id number]  Date Initiated:  01/26/2013  Documentation initiated by:  Letha Cape  Subjective/Objective Assessment:   dx cap  admit- lives alone. pta indep.     Action/Plan:   Anticipated DC Date:  01/26/2013   Anticipated DC Plan:  HOME/SELF CARE      DC Planning Services  CM consult      Choice offered to / List presented to:             Status of service:  Completed, signed off Medicare Important Message given?   (If response is "NO", the following Medicare IM given date fields will be blank) Date Medicare IM given:   Date Additional Medicare IM given:    Discharge Disposition:  HOME/SELF CARE  Per UR Regulation:  Reviewed for med. necessity/level of care/duration of stay  If discussed at Long Length of Stay Meetings, dates discussed:    Comments:  01/26/13 12:23 Letha Cape RN, BSN [phone] patient lives alone, pta indep. No NCM referral received, No needs anticpated.

## 2013-01-27 ENCOUNTER — Encounter: Payer: Self-pay | Admitting: Internal Medicine

## 2013-01-27 ENCOUNTER — Encounter: Payer: Self-pay | Admitting: Emergency Medicine

## 2013-01-28 LAB — CULTURE, RESPIRATORY W GRAM STAIN: Culture: NORMAL

## 2013-01-28 LAB — CULTURE, RESPIRATORY

## 2013-01-30 LAB — CULTURE, BLOOD (ROUTINE X 2)
Culture: NO GROWTH
Culture: NO GROWTH

## 2013-02-03 ENCOUNTER — Ambulatory Visit (INDEPENDENT_AMBULATORY_CARE_PROVIDER_SITE_OTHER): Payer: BC Managed Care – PPO | Admitting: Family

## 2013-02-03 ENCOUNTER — Encounter: Payer: Self-pay | Admitting: Family

## 2013-02-03 VITALS — BP 140/80 | HR 88 | Wt 201.0 lb

## 2013-02-03 DIAGNOSIS — N289 Disorder of kidney and ureter, unspecified: Secondary | ICD-10-CM

## 2013-02-03 DIAGNOSIS — R05 Cough: Secondary | ICD-10-CM

## 2013-02-03 DIAGNOSIS — R059 Cough, unspecified: Secondary | ICD-10-CM

## 2013-02-03 DIAGNOSIS — J189 Pneumonia, unspecified organism: Secondary | ICD-10-CM

## 2013-02-03 LAB — POCT URINALYSIS DIPSTICK
Bilirubin, UA: NEGATIVE
Glucose, UA: NEGATIVE
Ketones, UA: NEGATIVE
Leukocytes, UA: NEGATIVE
Nitrite, UA: NEGATIVE
Protein, UA: NEGATIVE
Spec Grav, UA: 1.015
Urobilinogen, UA: 0.2
pH, UA: 6

## 2013-02-03 LAB — COMPREHENSIVE METABOLIC PANEL
ALT: 14 U/L (ref 0–35)
AST: 18 U/L (ref 0–37)
Albumin: 3.5 g/dL (ref 3.5–5.2)
Alkaline Phosphatase: 46 U/L (ref 39–117)
BUN: 9 mg/dL (ref 6–23)
CO2: 25 mEq/L (ref 19–32)
Calcium: 8.5 mg/dL (ref 8.4–10.5)
Chloride: 104 mEq/L (ref 96–112)
Creatinine, Ser: 0.8 mg/dL (ref 0.4–1.2)
GFR: 78.31 mL/min (ref >60.00)
Glucose, Bld: 104 mg/dL — ABNORMAL HIGH (ref 70–99)
Potassium: 3.5 mEq/L (ref 3.5–5.1)
Sodium: 136 mEq/L (ref 135–145)
Total Bilirubin: 0.3 mg/dL (ref 0.3–1.2)
Total Protein: 7 g/dL (ref 6.0–8.3)

## 2013-02-03 MED ORDER — FLUCONAZOLE 150 MG PO TABS: 150.0000 mg | ORAL_TABLET | Freq: Once | ORAL | Status: DC

## 2013-02-03 MED ORDER — HYDROCODONE-HOMATROPINE 5-1.5 MG/5ML PO SYRP: 5.0000 mL | ORAL_SOLUTION | Freq: Four times a day (QID) | ORAL | Status: DC | PRN

## 2013-02-03 NOTE — Progress Notes (Signed)
Subjective:    Patient ID: Brandy Dunlap, female    DOB: May 05, 1950, 62 y.o.   MRN: [id number]  HPI 62 year old white female, patient of Dr. Lovell Sheehan is in today as a post hospital followup from 01/24/2013. She was hospitalized with community acquired pneumonia was given Rocephin and a Z-Pak intravenously and discharged home on Levaquin. She has recovered with progressive shortness of breath and chest pain. Continues to have a lingering cough particularly at night. She scheduled to go back to work tomorrow. She's requesting a cough medication for that time. Otherwise she feels well. She was also found to have renal insufficiency while in the hospital. Therefore, she needs her renal function reassessed.   Review of Systems  Constitutional: Negative.   HENT: Negative.   Respiratory: Negative.   Cardiovascular: Negative.   Gastrointestinal: Negative.   Endocrine: Negative.   Genitourinary: Negative.   Musculoskeletal: Negative.   Skin: Negative.   Allergic/Immunologic: Negative.   Neurological: Negative.   Hematological: Negative.   Psychiatric/Behavioral: Negative.    Past Medical History  Diagnosis Date  . Low back pain   . GERD (gastroesophageal reflux disease)   . Hypertension   . Osteoarthritis   . Sinus trouble   . Colon polyp 10/06/2003  . B12 deficiency     History   Social History  . Marital Status: Married    Spouse Name: N/A    Number of Children: 1  . Years of Education: N/A   Occupational History  . teacher    Social History Main Topics  . Smoking status: Never Smoker   . Smokeless tobacco: Never Used  . Alcohol Use: No  . Drug Use: No  . Sexual Activity: Yes   Other Topics Concern  . Not on file   Social History Narrative  . No narrative on file    Past Surgical History  Procedure Laterality Date  . Lumbar fusion  2008  . Cholecystectomy    . Tonsillectomy    . Partial hip arthroplasty Right 2009     hip replacement  . Colonoscopy w/  biopsies  10/06/2003  . Video bronchoscopy Bilateral 12/05/2012    Procedure: VIDEO BRONCHOSCOPY WITHOUT FLUORO;  Surgeon: Leslye Peer, MD;  Location: [facility];  Service: Cardiopulmonary;  Laterality: Bilateral;    Family History  Problem Relation Age of Onset  . COPD Father     died at age 60  . Emphysema Father   . Heart disease Brother     stent with 90%  . COPD Mother   . Heart disease Mother   . Ovarian cancer Mother   . Emphysema Mother     Allergies  Allergen Reactions  . Pantoprazole Sodium Other (See Comments)    Racing heart    Current Outpatient Prescriptions on File Prior to Visit  Medication Sig Dispense Refill  . albuterol (PROVENTIL HFA;VENTOLIN HFA) 108 (90 BASE) MCG/ACT inhaler Inhale 2 puffs into the lungs every 6 (six) hours as needed for wheezing.  1 Inhaler  0  . dexlansoprazole (DEXILANT) 60 MG capsule Take 60 mg by mouth daily.      [name] estrogen, conjugated,-medroxyprogesterone (PREMPRO) 0.45-1.5 MG per tablet Take 1 tablet by mouth daily.  90 tablet  3  . gabapentin (NEURONTIN) 300 MG capsule Take 300 mg by mouth 2 (two) times daily.       . Multiple Vitamin (MULTIVITAMIN) capsule Take 1 capsule by mouth daily.        [name] telmisartan (  MICARDIS) 80 MG tablet Take 1 tablet (80 mg total) by mouth daily.  90 tablet  3  . levofloxacin (LEVAQUIN) 750 MG tablet Take 1 tablet (750 mg total) by mouth daily.  5 tablet  0   No current facility-administered medications on file prior to visit.    BP 140/80  Pulse 88  Wt 201 lb (91.173 kg)chart    Objective:   Physical Exam  Constitutional: She is oriented to person, place, and time. She appears well-developed and well-nourished.  HENT:  Right Ear: External ear normal.  Left Ear: External ear normal.  Nose: Nose normal.  Mouth/Throat: Oropharynx is clear and moist.  Neck: Normal range of motion. Neck supple. No thyromegaly present.  Cardiovascular: Normal rate, regular rhythm and normal heart sounds.     Pulmonary/Chest: Effort normal and breath sounds normal.  Abdominal: Soft. Bowel sounds are normal.  Musculoskeletal: Normal range of motion.  Neurological: She is alert and oriented to person, place, and time.  Skin: Skin is warm and dry.  Psychiatric: She has a normal mood and affect.          Assessment & Plan:  Assessment: 1. Community-acquired pneumonia 2. Cough 3. Renal insufficiency  Plan: CMP and UA sent today one of her vision for the results. Drink plenty of fluids. Refill prescription for Hycodan syrup.

## 2013-02-03 NOTE — Patient Instructions (Signed)

## 2013-02-06 ENCOUNTER — Ambulatory Visit: Payer: BC Managed Care – PPO | Admitting: Internal Medicine

## 2013-02-24 ENCOUNTER — Ambulatory Visit: Payer: BC Managed Care – PPO | Admitting: Family Medicine

## 2013-02-24 ENCOUNTER — Ambulatory Visit (INDEPENDENT_AMBULATORY_CARE_PROVIDER_SITE_OTHER): Payer: BC Managed Care – PPO | Admitting: Family Medicine

## 2013-02-24 ENCOUNTER — Encounter: Payer: Self-pay | Admitting: Family Medicine

## 2013-02-24 VITALS — BP 160/100 | HR 113 | Temp 100.2°F

## 2013-02-24 DIAGNOSIS — R509 Fever, unspecified: Secondary | ICD-10-CM

## 2013-02-24 DIAGNOSIS — J111 Influenza due to unidentified influenza virus with other respiratory manifestations: Secondary | ICD-10-CM

## 2013-02-24 LAB — POCT INFLUENZA A/B
Influenza A, POC: NEGATIVE
Influenza B, POC: NEGATIVE

## 2013-02-24 MED ORDER — AZITHROMYCIN 250 MG PO TABS: ORAL_TABLET | ORAL | Status: DC

## 2013-02-24 MED ORDER — HYDROCODONE-HOMATROPINE 5-1.5 MG/5ML PO SYRP: 5.0000 mL | ORAL_SOLUTION | Freq: Three times a day (TID) | ORAL | Status: DC | PRN

## 2013-02-24 MED ORDER — OSELTAMIVIR PHOSPHATE 75 MG PO CAPS: 75.0000 mg | ORAL_CAPSULE | Freq: Two times a day (BID) | ORAL | Status: DC

## 2013-02-24 NOTE — Patient Instructions (Signed)
Influenza, Adult Influenza ("the flu") is a viral infection of the respiratory tract. It occurs more often in winter months because people spend more time in close contact with one another. Influenza can make you feel very sick. Influenza easily spreads from person to person (contagious). CAUSES  Influenza is caused by a virus that infects the respiratory tract. You can catch the virus by breathing in droplets from an infected person's cough or sneeze. You can also catch the virus by touching something that was recently contaminated with the virus and then touching your mouth, nose, or eyes. SYMPTOMS  Symptoms typically last 4 to 10 days and may include:  Fever.  Chills.  Headache, body aches, and muscle aches.  Sore throat.  Chest discomfort and cough.  Poor appetite.  Weakness or feeling tired.  Dizziness.  Nausea or vomiting. DIAGNOSIS  Diagnosis of influenza is often made based on your history and a physical exam. A nose or throat swab test can be done to confirm the diagnosis. RISKS AND COMPLICATIONS You may be at risk for a more severe case of influenza if you smoke cigarettes, have diabetes, have chronic heart disease (such as heart failure) or lung disease (such as asthma), or if you have a weakened immune system. Elderly people and pregnant women are also at risk for more serious infections. The most common complication of influenza is a lung infection (pneumonia). Sometimes, this complication can require emergency medical care and may be life-threatening. PREVENTION  An annual influenza vaccination (flu shot) is the best way to avoid getting influenza. An annual flu shot is now routinely recommended for all adults in the [place] TREATMENT  In mild cases, influenza goes away on its own. Treatment is directed at relieving symptoms. For more severe cases, your caregiver may prescribe antiviral medicines to shorten the sickness. Antibiotic medicines are not effective, because the  infection is caused by a virus, not by bacteria. HOME CARE INSTRUCTIONS  Only take over-the-counter or prescription medicines for pain, discomfort, or fever as directed by your caregiver.  Use a cool mist humidifier to make breathing easier.  Get plenty of rest until your temperature returns to normal. This usually takes 3 to 4 days.  Drink enough fluids to keep your urine clear or pale yellow.  Cover your mouth and nose when coughing or sneezing, and wash your hands well to avoid spreading the virus.  Stay home from work or school until your fever has been gone for at least 1 full day. SEEK MEDICAL CARE IF:   You have chest pain or a deep cough that worsens or produces more mucus.  You have nausea, vomiting, or diarrhea. SEEK IMMEDIATE MEDICAL CARE IF:   You have difficulty breathing, shortness of breath, or your skin or nails turn bluish.  You have a severe headache, facial pain, or earache.  You have a worsening or recurring fever.  You have nausea or vomiting that cannot be controlled. MAKE SURE YOU:  Understand these instructions.  Will watch your condition.  Will get help right away if you are not doing well or get worse. Document Released: 02/10/2000 Document Revised: 08/14/2011 Document Reviewed: 05/14/2011 Exit[employer] Patient Information 2014 [employer].  

## 2013-02-24 NOTE — Progress Notes (Signed)
Pre visit review using our clinic review tool, if applicable. No additional management support is needed unless otherwise documented below in the visit note. 

## 2013-02-24 NOTE — Progress Notes (Signed)
Chief Complaint  Patient presents with  . Cough    sore throat, body aches, chills     HPI:  -started: yesterday -symptoms:nasal congestion, sore throat, cough, sinus pain, body aches, chills fever -denies:fever, SOB, NVD, tooth pain -has tried: nothing -sick contacts/travel/risks:  Exposure to confirmed flu in son inlaw last week and a few days ago overnight while he was sick -Hx of: pneumonia requiring hospitalization in last month, felt well after discharge until yesterday ROS: See pertinent positives and negatives per HPI.  Past Medical History  Diagnosis Date  . Low back pain   . GERD (gastroesophageal reflux disease)   . Hypertension   . Osteoarthritis   . Sinus trouble   . Colon polyp 10/06/2003  . B12 deficiency     Past Surgical History  Procedure Laterality Date  . Lumbar fusion  2008  . Cholecystectomy    . Tonsillectomy    . Partial hip arthroplasty Right 2009     hip replacement  . Colonoscopy w/ biopsies  10/06/2003  . Video bronchoscopy Bilateral 12/05/2012    Procedure: VIDEO BRONCHOSCOPY WITHOUT FLUORO;  Surgeon: Leslye Peer, MD;  Location: [facility];  Service: Cardiopulmonary;  Laterality: Bilateral;    Family History  Problem Relation Age of Onset  . COPD Father     died at age 35  . Emphysema Father   . Heart disease Brother     stent with 90%  . COPD Mother   . Heart disease Mother   . Ovarian cancer Mother   . Emphysema Mother     History   Social History  . Marital Status: Married    Spouse Name: N/A    Number of Children: 1  . Years of Education: N/A   Occupational History  . teacher    Social History Main Topics  . Smoking status: Never Smoker   . Smokeless tobacco: Never Used  . Alcohol Use: No  . Drug Use: No  . Sexual Activity: Yes   Other Topics Concern  . None   Social History Narrative  . None    Current outpatient prescriptions:albuterol (PROVENTIL HFA;VENTOLIN HFA) 108 (90 BASE) MCG/ACT inhaler, Inhale 2  puffs into the lungs every 6 (six) hours as needed for wheezing., Disp: 1 Inhaler, Rfl: 0;  dexlansoprazole (DEXILANT) 60 MG capsule, Take 60 mg by mouth daily., Disp: , Rfl: ;  estrogen, conjugated,-medroxyprogesterone (PREMPRO) 0.45-1.5 MG per tablet, Take 1 tablet by mouth daily., Disp: 90 tablet, Rfl: 3 fluconazole (DIFLUCAN) 150 MG tablet, Take 1 tablet (150 mg total) by mouth once., Disp: 2 tablet, Rfl: 0;  gabapentin (NEURONTIN) 300 MG capsule, Take 300 mg by mouth 2 (two) times daily. , Disp: , Rfl: ;  Multiple Vitamin (MULTIVITAMIN) capsule, Take 1 capsule by mouth daily.  , Disp: , Rfl: ;  telmisartan (MICARDIS) 80 MG tablet, Take 1 tablet (80 mg total) by mouth daily., Disp: 90 tablet, Rfl: 3 azithromycin (ZITHROMAX) 250 MG tablet, 2 tabs on first day then 1 tab daily for 4 more days, Disp: 6 tablet, Rfl: 0;  HYDROcodone-homatropine (HYCODAN) 5-1.5 MG/5ML syrup, Take 5 mLs by mouth every 8 (eight) hours as needed for cough., Disp: 120 mL, Rfl: 0;  oseltamivir (TAMIFLU) 75 MG capsule, Take 1 capsule (75 mg total) by mouth 2 (two) times daily., Disp: 10 capsule, Rfl: 0  EXAM:  Filed Vitals:   02/24/13 1357  BP: 160/100  Pulse: 113  Temp: 100.2 F (37.9 C)    Body mass  index is 0.00 kg/(m^2).  GENERAL: vitals reviewed and listed above, alert, oriented, appears well hydrated and in no acute distress  HEENT: atraumatic, conjunttiva clear, no obvious abnormalities on inspection of external nose and ears, normal appearance of ear canals and TMs, clear nasal congestion, mild post oropharyngeal erythema with PND, no tonsillar edema or exudate, no sinus TTP  NECK: no obvious masses on inspection  LUNGS: difuse wheezing  CV: HRRR, no peripheral edema  MS: moves all extremities without noticeable abnormality  PSYCH: pleasant and cooperative, no obvious depression or anxiety  ASSESSMENT AND PLAN:  Discussed the following assessment and plan:  Fever - Plan: POC Influenza  A/B  Influenza with other respiratory manifestations - Plan: azithromycin (ZITHROMAX) 250 MG tablet, oseltamivir (TAMIFLU) 75 MG capsule, HYDROcodone-homatropine (HYCODAN) 5-1.5 MG/5ML syrup  -given HPI and exam findings today, a serious infection or illness is unlikely. We discussed potential etiologies, with influenza and bronchial/pulm involvement likely despite rapid flu neg. We discussed that neg rapid flu test does not rule out influenza and given exposure hx, symptoms, recent severe illness advised abx and option of tamiflu as in treatment window. After discussion risks benefits she opted for both of these. Treatment side effects, likely course, antibiotic misuse, transmission, and signs of developing a serious illness. -of course, we advised to return or notify a doctor immediately if symptoms worsen or persist or new concerns arise.    Patient Instructions  Influenza, Adult Influenza ("the flu") is a viral infection of the respiratory tract. It occurs more often in winter months because people spend more time in close contact with one another. Influenza can make you feel very sick. Influenza easily spreads from person to person (contagious). CAUSES  Influenza is caused by a virus that infects the respiratory tract. You can catch the virus by breathing in droplets from an infected person's cough or sneeze. You can also catch the virus by touching something that was recently contaminated with the virus and then touching your mouth, nose, or eyes. SYMPTOMS  Symptoms typically last 4 to 10 days and may include:  Fever.  Chills.  Headache, body aches, and muscle aches.  Sore throat.  Chest discomfort and cough.  Poor appetite.  Weakness or feeling tired.  Dizziness.  Nausea or vomiting. DIAGNOSIS  Diagnosis of influenza is often made based on your history and a physical exam. A nose or throat swab test can be done to confirm the diagnosis. RISKS AND COMPLICATIONS You may be  at risk for a more severe case of influenza if you smoke cigarettes, have diabetes, have chronic heart disease (such as heart failure) or lung disease (such as asthma), or if you have a weakened immune system. Elderly people and pregnant women are also at risk for more serious infections. The most common complication of influenza is a lung infection (pneumonia). Sometimes, this complication can require emergency medical care and may be life-threatening. PREVENTION  An annual influenza vaccination (flu shot) is the best way to avoid getting influenza. An annual flu shot is now routinely recommended for all adults in the [place] TREATMENT  In mild cases, influenza goes away on its own. Treatment is directed at relieving symptoms. For more severe cases, your caregiver may prescribe antiviral medicines to shorten the sickness. Antibiotic medicines are not effective, because the infection is caused by a virus, not by bacteria. HOME CARE INSTRUCTIONS  Only take over-the-counter or prescription medicines for pain, discomfort, or fever as directed by your caregiver.  Use a cool  mist humidifier to make breathing easier.  Get plenty of rest until your temperature returns to normal. This usually takes 3 to 4 days.  Drink enough fluids to keep your urine clear or pale yellow.  Cover your mouth and nose when coughing or sneezing, and wash your hands well to avoid spreading the virus.  Stay home from work or school until your fever has been gone for at least 1 full day. SEEK MEDICAL CARE IF:   You have chest pain or a deep cough that worsens or produces more mucus.  You have nausea, vomiting, or diarrhea. SEEK IMMEDIATE MEDICAL CARE IF:   You have difficulty breathing, shortness of breath, or your skin or nails turn bluish.  You have a severe headache, facial pain, or earache.  You have a worsening or recurring fever.  You have nausea or vomiting that cannot be controlled. MAKE SURE YOU:  Understand  these instructions.  Will watch your condition.  Will get help right away if you are not doing well or get worse. Document Released: 02/10/2000 Document Revised: 08/14/2011 Document Reviewed: 05/14/2011 [facility] Patient Information 2014 [place], Lona Kettle, Dahlia Client R.

## 2013-03-25 ENCOUNTER — Encounter: Payer: Self-pay | Admitting: Emergency Medicine

## 2013-03-25 ENCOUNTER — Ambulatory Visit (INDEPENDENT_AMBULATORY_CARE_PROVIDER_SITE_OTHER): Payer: BC Managed Care – PPO | Admitting: Emergency Medicine

## 2013-03-25 VITALS — BP 130/90 | HR 99 | Ht 65.0 in | Wt 198.0 lb

## 2013-03-25 DIAGNOSIS — R05 Cough: Secondary | ICD-10-CM

## 2013-03-25 DIAGNOSIS — R059 Cough, unspecified: Secondary | ICD-10-CM

## 2013-03-25 NOTE — Assessment & Plan Note (Signed)
This has been multifactorial. Her recent course has been c/b LLL CAP, and then by a viral URI. She seemed to respond clinically to antibiotics on both occasions. Her FOB was significant only for some raised hypopigmented nodular changes, bx's negative. She has been seen by Dr Redmond Baseman and is on Neurontin. I would like for her to have a repeat layngoscopy / tracheoscopy to assess the nodules, preferably by Dr Redmond Baseman for a second opinion on what these are.

## 2013-03-25 NOTE — Patient Instructions (Signed)
Try using Delsym as needed to suppress your cough Follow with Dr Lamonte Sakai in March to discuss the timing of a repeat laryngoscopy / bronchoscopy.  Call our our office or follow up sooner if you have more problems with the cough in the meantime.

## 2013-03-25 NOTE — Progress Notes (Signed)
Subjective:    Patient ID: Brandy Dunlap, female    DOB: 25-Feb-1951, 63 y.o.   MRN: [id number]  Cough Pertinent negatives include no ear pain, eye redness, fever, headaches, postnasal drip, rash, rhinorrhea, sore throat, shortness of breath or wheezing.   63 yo never smoker, hx of HTN, GERD, OA, "sinus trouble".  She is referred for chronic cough. States that she caught URI 12/13, all of the cold sx improved, then she caught the flu. She has kept a dry cough remained and has persisted. She does get some breaks for a few days but then it returns. She has been tried on tessalon, cough syrup + codeine. The cough happens all day, non-productive.   She was formerly on allergy shots but stopped these years ago.  She was changed from brand name micardis to generic telmisartan   ROV 08/08/12 -- f/u for chronic cough. Last time we increased nexium temporarily, added loratadine and nasonex, voice rest. She may have benefited some, especially when she was on double Nexium.   ROV 09/18/12 -- hx chronic cough. She has seen GI, changed to dexilant 60mg , has been adjusting to this, having diarrhea. Reflux is better. Still w some nasal gtt, esp at night.   ROV 11/21/12 -- follows up for her cough. Last time we ramped up her allergy regimen, she continued the GERD regimen. Her cough continued. CT scan sinuses showed fluid and chronic sinusitis. She was treated with augmentin x 10 by Dr Redmond Baseman.   ROV 12/24/12 -- follow up for cough, allergies, GERD. She is s/p FOB 10/10 that showed some raised hypopigmented lesions in the trachea. Bx and micro negative, not clear that viral cx was done. Suspicion for possible granulomatous inflammation, ? Possible viral process. Did not look like thrush. Her cough is better on current regimen.   ROV 03/25/13 -- follow up for cough, allergies, GERD. Raised hypopigmented lesions on FOB on trachea. She has been rx with neurontin with some improvement. She was hospitalized in November for  LLL CAP. Has had difficulty w cough on multiple occasions since and has been rx with abx x1 beginning of January. She is now much improved.     Review of Systems  Constitutional: Negative for fever and unexpected weight change.  HENT: Negative for congestion, dental problem, ear pain, nosebleeds, postnasal drip, rhinorrhea, sinus pressure, sneezing, sore throat and trouble swallowing.   Eyes: Negative for redness and itching.  Respiratory: Positive for cough. Negative for chest tightness, shortness of breath and wheezing.   Cardiovascular: Negative for palpitations and leg swelling.  Gastrointestinal: Negative for nausea and vomiting.  Genitourinary: Negative for dysuria.  Musculoskeletal: Negative for joint swelling.  Skin: Negative for rash.  Neurological: Negative for headaches.  Hematological: Does not bruise/bleed easily.  Psychiatric/Behavioral: Negative for dysphoric mood. The patient is not nervous/anxious.       Objective:   Physical Exam Filed Vitals:   03/25/13 1545  BP: 130/90  Pulse: 99  Height: 5\' 5"  (1.651 m)  Weight: 198 lb (89.812 kg)  SpO2: 94%   Gen: Pleasant, well-nourished, in no distress,  normal affect  ENT: No lesions,  mouth clear,  oropharynx clear, no postnasal drip  Neck: No JVD, no TMG, no carotid bruits  Lungs: No use of accessory muscles, clear without rales or rhonchi  Cardiovascular: RRR, heart sounds normal, no murmur or gallops, no peripheral edema  Musculoskeletal: No deformities, no cyanosis or clubbing  Neuro: alert, non focal  Skin: Warm, no lesions or  rashes     Assessment & Plan:  Cough This has been multifactorial. Her recent course has been c/b LLL CAP, and then by a viral URI. She seemed to respond clinically to antibiotics on both occasions. Her FOB was significant only for some raised hypopigmented nodular changes, bx's negative. She has been seen by Dr Redmond Baseman and is on Neurontin. I would like for her to have a repeat  layngoscopy / tracheoscopy to assess the nodules, preferably by Dr Redmond Baseman for a second opinion on what these are.

## 2013-04-20 ENCOUNTER — Ambulatory Visit: Payer: BC Managed Care – PPO | Admitting: Internal Medicine

## 2013-05-04 ENCOUNTER — Encounter: Payer: Self-pay | Admitting: Emergency Medicine

## 2013-05-04 DIAGNOSIS — J9811 Atelectasis: Secondary | ICD-10-CM

## 2013-05-04 NOTE — Telephone Encounter (Signed)
Patient Instructions oV 03/25/13     Try using Delsym as needed to suppress your cough  Follow with Dr Lamonte Sakai in March to discuss the timing of a repeat laryngoscopy / bronchoscopy.  Call our our office or follow up sooner if you have more problems with the cough in the meantime.   --  Spoke with pt and she reports her cough is not better. She is wanting to have CXR or CT scan done before she see's RB on 05/25/13 before deciding next step. She is fine with a call back tomorrow. Please advise RB thanks

## 2013-05-06 NOTE — Telephone Encounter (Signed)
I reviewed our notes from last visit. She did have a LLL infiltrate or atx on her CXR from 11/'14. She should have a repeat CXR (PA/lat) now to compare. If the film remains abnormal then we will order the CT scan before our office visit. I am putting in the order for the CXR. Please let her know the plan

## 2013-05-07 NOTE — Telephone Encounter (Signed)
Message sent advising the pt. [place] Bing, CMA

## 2013-05-12 ENCOUNTER — Ambulatory Visit (INDEPENDENT_AMBULATORY_CARE_PROVIDER_SITE_OTHER)
Admission: RE | Admit: 2013-05-12 | Discharge: 2013-05-12 | Disposition: A | Discharge status: Home / Self Care | Payer: BC Managed Care – PPO | Source: Ambulatory Visit | Attending: Emergency Medicine | Admitting: Emergency Medicine

## 2013-05-12 DIAGNOSIS — J9819 Other pulmonary collapse: Secondary | ICD-10-CM

## 2013-05-12 DIAGNOSIS — J9811 Atelectasis: Secondary | ICD-10-CM

## 2013-05-15 ENCOUNTER — Encounter: Payer: Self-pay | Admitting: Family

## 2013-05-15 ENCOUNTER — Ambulatory Visit (INDEPENDENT_AMBULATORY_CARE_PROVIDER_SITE_OTHER): Payer: BC Managed Care – PPO | Admitting: Family

## 2013-05-15 VITALS — BP 120/80 | HR 78 | Temp 98.1°F

## 2013-05-15 DIAGNOSIS — J209 Acute bronchitis, unspecified: Secondary | ICD-10-CM

## 2013-05-15 DIAGNOSIS — I10 Essential (primary) hypertension: Secondary | ICD-10-CM

## 2013-05-15 MED ORDER — HYDROCOD POLST-CHLORPHEN POLST 10-8 MG/5ML PO LQCR: 5.0000 mL | Freq: Two times a day (BID) | ORAL | Status: DC | PRN

## 2013-05-15 MED ORDER — PREDNISONE 20 MG PO TABS: ORAL_TABLET | ORAL | Status: DC

## 2013-05-15 NOTE — Patient Instructions (Signed)
1. CONSIDER ALLERGIST 2. CONSIDER LONG ACTING INHALER SUCH AS SYMBICORT   Acute Bronchitis Bronchitis is inflammation of the airways that extend from the windpipe into the lungs (bronchi). The inflammation often causes mucus to develop. This leads to a cough, which is the most common symptom of bronchitis.  In acute bronchitis, the condition usually develops suddenly and goes away over time, usually in a couple weeks. Smoking, allergies, and asthma can make bronchitis worse. Repeated episodes of bronchitis may cause further lung problems.  CAUSES Acute bronchitis is most often caused by the same virus that causes a cold. The virus can spread from person to person (contagious).  SIGNS AND SYMPTOMS   Cough.   Fever.   Coughing up mucus.   Body aches.   Chest congestion.   Chills.   Shortness of breath.   Sore throat.  DIAGNOSIS  Acute bronchitis is usually diagnosed through a physical exam. Tests, such as chest X-rays, are sometimes done to rule out other conditions.  TREATMENT  Acute bronchitis usually goes away in a couple weeks. Often times, no medical treatment is necessary. Medicines are sometimes given for relief of fever or cough. Antibiotics are usually not needed but may be prescribed in certain situations. In some cases, an inhaler may be recommended to help reduce shortness of breath and control the cough. A cool mist vaporizer may also be used to help thin bronchial secretions and make it easier to clear the chest.  HOME CARE INSTRUCTIONS  Get plenty of rest.   Drink enough fluids to keep your urine clear or pale yellow (unless you have a medical condition that requires fluid restriction). Increasing fluids may help thin your secretions and will prevent dehydration.   Only take over-the-counter or prescription medicines as directed by your health care provider.   Avoid smoking and secondhand smoke. Exposure to cigarette smoke or irritating chemicals will  make bronchitis worse. If you are a smoker, consider using nicotine gum or skin patches to help control withdrawal symptoms. Quitting smoking will help your lungs heal faster.   Reduce the chances of another bout of acute bronchitis by washing your hands frequently, avoiding people with cold symptoms, and trying not to touch your hands to your mouth, nose, or eyes.   Follow up with your health care provider as directed.  SEEK MEDICAL CARE IF: Your symptoms do not improve after 1 week of treatment.  SEEK IMMEDIATE MEDICAL CARE IF:  You develop an increased fever or chills.   You have chest pain.   You have severe shortness of breath.  You have bloody sputum.   You develop dehydration.  You develop fainting.  You develop repeated vomiting.  You develop a severe headache. MAKE SURE YOU:   Understand these instructions.  Will watch your condition.  Will get help right away if you are not doing well or get worse. Document Released: 03/22/2004 Document Revised: 10/15/2012 Document Reviewed: 08/05/2012 [facility] Patient Information 2014 [place].

## 2013-05-15 NOTE — Progress Notes (Signed)
Subjective:    Patient ID: Brandy Dunlap, female    DOB: 12-07-1950, 63 y.o.   MRN: [id number]  HPI  63 year old white female, nonsmoker, with a history of allergic rhinitis and past medical history of asthma is in today with complaints of cough, congestion, wheezing x2 weeks. It is accompanied by sore throat. Reports having to use her Proventil inhaler approximately once a day. She hasn't taken Alka-Seltzer cold and sinus that helps but does not get rid of her symptoms. Denies any fever, muscle aches or pain. Had been on allergy injections in the past for control of allergic rhinitis. She is due to see pulmonology March 30  Review of Systems  Constitutional: Negative.   Cardiovascular: Negative.   Gastrointestinal: Negative.   Genitourinary: Negative.   Musculoskeletal: Negative.   Skin: Negative.   Neurological: Negative.   Psychiatric/Behavioral: Negative.    Past Medical History  Diagnosis Date  . Low back pain   . GERD (gastroesophageal reflux disease)   . Hypertension   . Osteoarthritis   . Sinus trouble   . Colon polyp 10/06/2003  . B12 deficiency     History   Social History  . Marital Status: Married    Spouse Name: N/A    Number of Children: 1  . Years of Education: N/A   Occupational History  . teacher    Social History Main Topics  . Smoking status: Never Smoker   . Smokeless tobacco: Never Used  . Alcohol Use: No  . Drug Use: No  . Sexual Activity: Yes   Other Topics Concern  . Not on file   Social History Narrative  . No narrative on file    Past Surgical History  Procedure Laterality Date  . Lumbar fusion  2008  . Cholecystectomy    . Tonsillectomy    . Partial hip arthroplasty Right 2009     hip replacement  . Colonoscopy w/ biopsies  10/06/2003  . Video bronchoscopy Bilateral 12/05/2012    Procedure: VIDEO BRONCHOSCOPY WITHOUT FLUORO;  Surgeon: Brandy Gobble, MD;  Location: [facility];  Service: Cardiopulmonary;  Laterality:  Bilateral;    Family History  Problem Relation Age of Onset  . COPD Father     died at age 39  . Emphysema Father   . Heart disease Brother     stent with 90%  . COPD Mother   . Heart disease Mother   . Ovarian cancer Mother   . Emphysema Mother     Allergies  Allergen Reactions  . Pantoprazole Sodium Other (See Comments)    Racing heart    Current Outpatient Prescriptions on File Prior to Visit  Medication Sig Dispense Refill  . albuterol (PROVENTIL HFA;VENTOLIN HFA) 108 (90 BASE) MCG/ACT inhaler Inhale 2 puffs into the lungs every 6 (six) hours as needed for wheezing.  1 Inhaler  0  . dexlansoprazole (DEXILANT) 60 MG capsule Take 60 mg by mouth daily.      [name] estrogen, conjugated,-medroxyprogesterone (PREMPRO) 0.45-1.5 MG per tablet Take 1 tablet by mouth daily.  90 tablet  3  . Multiple Vitamin (MULTIVITAMIN) capsule Take 1 capsule by mouth daily.        [name] telmisartan (MICARDIS) 80 MG tablet Take 1 tablet (80 mg total) by mouth daily.  90 tablet  3  . gabapentin (NEURONTIN) 300 MG capsule Take 300 mg by mouth 2 (two) times daily.       [name] HYDROcodone-homatropine (HYCODAN) 5-1.5 MG/5ML syrup Take 5 mLs  by mouth every 8 (eight) hours as needed for cough.  120 mL  0   No current facility-administered medications on file prior to visit.    BP 120/80  Pulse 78  Temp(Src) 98.1 F (36.7 C) (Oral)  SpO2 98%chart and    Objective:   Physical Exam  Constitutional: She is oriented to person, place, and time. She appears well-developed and well-nourished.  HENT:  Right Ear: External ear normal.  Left Ear: External ear normal.  Nose: Nose normal.  Mouth/Throat: Oropharynx is clear and moist.  Neck: Normal range of motion. Neck supple.  Cardiovascular: Normal rate, regular rhythm and normal heart sounds.   Pulmonary/Chest: Effort normal. She has wheezes. She has no rales.  Musculoskeletal: Normal range of motion.  Neurological: She is alert and oriented to person, place, and  time.  Skin: Skin is warm and dry.  Psychiatric: She has a normal mood and affect.          Assessment & Plan:  Brandy Dunlap was seen today for cough, sore throat and otalgia.  Diagnoses and associated orders for this visit:  Acute bronchitis  Unspecified essential hypertension  Other Orders - predniSONE (DELTASONE) 20 MG tablet; 60mg  PO qam x 3 days, 40mg  po qam x 3 days, 20mg  qam x 3 days - chlorpheniramine-HYDROcodone (TUSSIONEX PENNKINETIC ER) 10-8 MG/5ML LQCR; Take 5 mLs by mouth every 12 (twelve) hours as needed for cough.    Call with any questions or concerns. Recheck with pulmonology and here as needed. Consider allergist.

## 2013-05-15 NOTE — Progress Notes (Signed)
Pre visit review using our clinic review tool, if applicable. No additional management support is needed unless otherwise documented below in the visit note. 

## 2013-05-18 ENCOUNTER — Telehealth: Payer: Self-pay | Admitting: Internal Medicine

## 2013-05-18 NOTE — Telephone Encounter (Signed)
Relevant patient education assigned to patient using Emmi. ° °

## 2013-05-25 ENCOUNTER — Encounter: Payer: Self-pay | Admitting: Emergency Medicine

## 2013-05-25 ENCOUNTER — Ambulatory Visit (INDEPENDENT_AMBULATORY_CARE_PROVIDER_SITE_OTHER): Payer: BC Managed Care – PPO | Admitting: Emergency Medicine

## 2013-05-25 VITALS — BP 130/78 | HR 76 | Ht 63.0 in | Wt 197.0 lb

## 2013-05-25 DIAGNOSIS — R05 Cough: Secondary | ICD-10-CM

## 2013-05-25 DIAGNOSIS — R059 Cough, unspecified: Secondary | ICD-10-CM

## 2013-05-25 NOTE — Assessment & Plan Note (Signed)
With allergic and GERD components.   Please continue your allegra daily Please restart the fluticasone nasal spray, 2 sprays each nostril twice a day.  Continue your nasal saline washes Follow with Dr Redmond Baseman - get your airway exam. Alternatively this could be done by Dr Lamonte Sakai Follow with Dr Lamonte Sakai in 4 months or sooner if you have any problems.

## 2013-05-25 NOTE — Progress Notes (Signed)
Subjective:    Patient ID: Brandy Dunlap, female    DOB: [birth year], 63 y.o.   MRN: [id number]  Cough Pertinent negatives include no ear pain, eye redness, fever, headaches, postnasal drip, rash, rhinorrhea, sore throat, shortness of breath or wheezing.   63 yo never smoker, hx of HTN, GERD, OA, "sinus trouble".  She is referred for chronic cough. States that she caught URI 12/13, all of the cold sx improved, then she caught the flu. She has kept a dry cough remained and has persisted. She does get some breaks for a few days but then it returns. She has been tried on tessalon, cough syrup + codeine. The cough happens all day, non-productive.   She was formerly on allergy shots but stopped these years ago.  She was changed from brand name micardis to generic telmisartan   ROV 08/08/12 -- f/u for chronic cough. Last time we increased nexium temporarily, added loratadine and nasonex, voice rest. She may have benefited some, especially when she was on double Nexium.   ROV 09/18/12 -- hx chronic cough. She has seen GI, changed to dexilant 60mg , has been adjusting to this, having diarrhea. Reflux is better. Still w some nasal gtt, esp at night.   ROV 11/21/12 -- follows up for her cough. Last time we ramped up her allergy regimen, she continued the GERD regimen. Her cough continued. CT scan sinuses showed fluid and chronic sinusitis. She was treated with augmentin x 10 by Dr Redmond Baseman.   ROV 12/24/12 -- follow up for cough, allergies, GERD. She is s/p FOB 10/10 that showed some raised hypopigmented lesions in the trachea. Bx and micro negative, not clear that viral cx was done. Suspicion for possible granulomatous inflammation, ? Possible viral process. Did not look like thrush. Her cough is better on current regimen.   ROV 03/25/13 -- follow up for cough, allergies, GERD. Raised hypopigmented lesions on FOB on trachea. She has been rx with neurontin with some improvement. She was hospitalized in November for  LLL CAP. Has had difficulty w cough on multiple occasions since and has been rx with abx x1 beginning of January. She is now much improved.    ROV 05/25/13 -- follow up for cough, allergies, GERD. Raised hypopigmented lesions on FOB on trachea. She is seen by Dr Redmond Baseman also, has been rx with neurontin now down to qd. She was seen again recently and treated with prednisone. She is now taking OTC allegra.  She is using nasal saline washes.     Review of Systems  Constitutional: Negative for fever and unexpected weight change.  HENT: Negative for congestion, dental problem, ear pain, nosebleeds, postnasal drip, rhinorrhea, sinus pressure, sneezing, sore throat and trouble swallowing.   Eyes: Negative for redness and itching.  Respiratory: Positive for cough. Negative for chest tightness, shortness of breath and wheezing.   Cardiovascular: Negative for palpitations and leg swelling.  Gastrointestinal: Negative for nausea and vomiting.  Genitourinary: Negative for dysuria.  Musculoskeletal: Negative for joint swelling.  Skin: Negative for rash.  Neurological: Negative for headaches.  Hematological: Does not bruise/bleed easily.  Psychiatric/Behavioral: Negative for dysphoric mood. The patient is not nervous/anxious.       Objective:   Physical Exam Filed Vitals:   05/25/13 1637  BP: 130/78  Pulse: 76  Height: 5\' 3"  (1.6 m)  Weight: 197 lb (89.359 kg)  SpO2: 98%   Gen: Pleasant, well-nourished, in no distress,  normal affect  ENT: No lesions,  mouth clear,  oropharynx  clear, no postnasal drip  Neck: No JVD, no TMG, no carotid bruits  Lungs: No use of accessory muscles, clear without rales or rhonchi  Cardiovascular: RRR, heart sounds normal, no murmur or gallops, no peripheral edema  Musculoskeletal: No deformities, no cyanosis or clubbing  Neuro: alert, non focal  Skin: Warm, no lesions or rashes     Assessment & Plan:  Cough With allergic and GERD components.   Please  continue your allegra daily Please restart the fluticasone nasal spray, 2 sprays each nostril twice a day.  Continue your nasal saline washes Follow with Dr Redmond Baseman - get your airway exam. Alternatively this could be done by Dr Lamonte Sakai Follow with Dr Lamonte Sakai in 4 months or sooner if you have any problems.

## 2013-05-25 NOTE — Patient Instructions (Signed)
Please continue your allegra daily Please restart the fluticasone nasal spray, 2 sprays each nostril twice a day.  Continue your nasal saline washes Follow with Dr Redmond Baseman - get your airway exam. Alternatively this could be done by Dr Lamonte Sakai Follow with Dr Lamonte Sakai in 4 months or sooner if you have any problems.

## 2013-06-24 ENCOUNTER — Ambulatory Visit (INDEPENDENT_AMBULATORY_CARE_PROVIDER_SITE_OTHER): Payer: BC Managed Care – PPO | Admitting: Family

## 2013-06-24 ENCOUNTER — Encounter: Payer: Self-pay | Admitting: Family

## 2013-06-24 VITALS — BP 120/80 | HR 76 | Temp 98.6°F | Wt 194.0 lb

## 2013-06-24 DIAGNOSIS — R059 Cough, unspecified: Secondary | ICD-10-CM

## 2013-06-24 DIAGNOSIS — J209 Acute bronchitis, unspecified: Secondary | ICD-10-CM

## 2013-06-24 DIAGNOSIS — R05 Cough: Secondary | ICD-10-CM

## 2013-06-24 DIAGNOSIS — J309 Allergic rhinitis, unspecified: Secondary | ICD-10-CM

## 2013-06-24 MED ORDER — PREDNISONE 20 MG PO TABS: ORAL_TABLET | ORAL | Status: AC

## 2013-06-24 NOTE — Patient Instructions (Signed)

## 2013-06-24 NOTE — Progress Notes (Signed)
Subjective:    Patient ID: Brandy Dunlap, female    DOB: May 17, 1950, 63 y.o.   MRN: [id number]  HPI 63 year old white female, nonsmoker is in today with complaints of cough, wheezing that comes and goes. She was treated one month ago for bronchitis the prednisone that worked well. She seen pulmonology and believes that her larynx and trachea are close together potentially causing her to have issues. She has a history of allergic rhinitis in the past. Has not seen an allergist in many years but was previously on allergy injections. Cough is nonproductive. Denies any known allergens.   Review of Systems  Constitutional: Negative.   HENT: Negative.   Respiratory: Positive for cough and wheezing.   Cardiovascular: Negative.   Gastrointestinal: Negative.   Genitourinary: Negative.   Musculoskeletal: Negative.   Skin: Negative.   Allergic/Immunologic: Negative.        History of allergies, unknown source  Psychiatric/Behavioral: Negative.    Past Medical History  Diagnosis Date  . Low back pain   . GERD (gastroesophageal reflux disease)   . Hypertension   . Osteoarthritis   . Sinus trouble   . Colon polyp 10/06/2003  . B12 deficiency     History   Social History  . Marital Status: Married    Spouse Name: N/A    Number of Children: 1  . Years of Education: N/A   Occupational History  . teacher    Social History Main Topics  . Smoking status: Never Smoker   . Smokeless tobacco: Never Used  . Alcohol Use: No  . Drug Use: No  . Sexual Activity: Yes   Other Topics Concern  . Not on file   Social History Narrative  . No narrative on file    Past Surgical History  Procedure Laterality Date  . Lumbar fusion  2008  . Cholecystectomy    . Tonsillectomy    . Partial hip arthroplasty Right 2009     hip replacement  . Colonoscopy w/ biopsies  10/06/2003  . Video bronchoscopy Bilateral 12/05/2012    Procedure: VIDEO BRONCHOSCOPY WITHOUT FLUORO;  Surgeon: Collene Gobble,  MD;  Location: [facility];  Service: Cardiopulmonary;  Laterality: Bilateral;    Family History  Problem Relation Age of Onset  . COPD Father     died at age 58  . Emphysema Father   . Heart disease Brother     stent with 90%  . COPD Mother   . Heart disease Mother   . Ovarian cancer Mother   . Emphysema Mother     Allergies  Allergen Reactions  . Pantoprazole Sodium Other (See Comments)    Racing heart    Current Outpatient Prescriptions on File Prior to Visit  Medication Sig Dispense Refill  . albuterol (PROVENTIL HFA;VENTOLIN HFA) 108 (90 BASE) MCG/ACT inhaler Inhale 2 puffs into the lungs every 6 (six) hours as needed for wheezing.  1 Inhaler  0  . dexlansoprazole (DEXILANT) 60 MG capsule Take 60 mg by mouth daily.      [name] estrogen, conjugated,-medroxyprogesterone (PREMPRO) 0.45-1.5 MG per tablet Take 1 tablet by mouth daily.  90 tablet  3  . gabapentin (NEURONTIN) 300 MG capsule Take 300 mg by mouth 2 (two) times daily.       . Multiple Vitamin (MULTIVITAMIN) capsule Take 1 capsule by mouth daily.        [name] telmisartan (MICARDIS) 80 MG tablet Take 1 tablet (80 mg total) by mouth daily.  [place]  tablet  3  . HYDROcodone-homatropine (HYCODAN) 5-1.5 MG/5ML syrup Take 5 mLs by mouth every 8 (eight) hours as needed for cough.  120 mL  0   No current facility-administered medications on file prior to visit.    BP 120/80  Pulse 76  Temp(Src) 98.6 F (37 C) (Oral)  Wt 194 lb (87.998 kg)  SpO2 97%chart    Objective:   Physical Exam  Constitutional: She is oriented to person, place, and time. She appears well-developed and well-nourished.  HENT:  Right Ear: External ear normal.  Left Ear: External ear normal.  Nose: Nose normal.  Mouth/Throat: Oropharynx is clear and moist.  Neck: Normal range of motion. Neck supple.  Cardiovascular: Normal rate, regular rhythm and normal heart sounds.   Pulmonary/Chest: Effort normal and breath sounds normal.  Musculoskeletal: Normal range  of motion.  Neurological: She is alert and oriented to person, place, and time.  Skin: Skin is warm and dry.  Psychiatric: She has a normal mood and affect.          Assessment & Plan:  Welda was seen today for no specified reason.  Diagnoses and associated orders for this visit:  Acute bronchitis - [place] allergy panel  Cough - [place] allergy panel  Allergic rhinitis  Other Orders - predniSONE (DELTASONE) 20 MG tablet; 60mg  PO qam x 3 days, 40mg  po qam x 3 days, 20mg  qam x 3 days    advised to consult with the allergist. Patient verbalized understanding. The office with any questions or concerns. Recheck as scheduled and as needed.

## 2013-06-24 NOTE — Progress Notes (Signed)
Pre visit review using our clinic review tool, if applicable. No additional management support is needed unless otherwise documented below in the visit note. 

## 2013-06-25 LAB — ~~LOC~~ ALLERGY PANEL
Allergen, Cedar tree, t12: 25.6 kU/L — ABNORMAL HIGH
Allergen, Comm Silver Birch, t9: <0.1 kU/L
Allergen, D pternoyssinus,d7: <0.1 kU/L
Allergen, Mulberry, t76: <0.1 kU/L
Alternaria Alternata: <0.1 kU/L
Aspergillus fumigatus, m3: <0.1 kU/L
Bahia Grass: <0.1 kU/L
Bermuda Grass: <0.1 kU/L
Box Elder IgE: 0.12 kU/L — ABNORMAL HIGH
Cat Dander: <0.1 kU/L
Cladosporium Herbarum: <0.1 kU/L
Cockroach: <0.1 kU/L
Common Ragweed: <0.1 kU/L
D. farinae: <0.1 kU/L
Dog Dander: 2.54 kU/L — ABNORMAL HIGH
Elm IgE: <0.1 kU/L
Johnson Grass: <0.1 kU/L
Mucor Racemosus: <0.1 kU/L
Mugwort: <0.1 kU/L
Nettle: <0.1 kU/L
Oak: <0.1 kU/L
Pecan/Hickory Tree IgE: <0.1 kU/L
Penicillium Notatum: <0.1 kU/L
Plantain: <0.1 kU/L
Rough Pigweed  IgE: <0.1 kU/L
Sheep Sorrel IgE: <0.1 kU/L
Stemphylium Botryosum: <0.1 kU/L
Sweet Gum: <0.1 kU/L
Timothy Grass: 1.67 kU/L — ABNORMAL HIGH

## 2013-08-02 ENCOUNTER — Other Ambulatory Visit: Payer: Self-pay | Admitting: Internal Medicine

## 2013-08-07 ENCOUNTER — Ambulatory Visit (INDEPENDENT_AMBULATORY_CARE_PROVIDER_SITE_OTHER): Payer: BC Managed Care – PPO | Admitting: Family

## 2013-08-07 ENCOUNTER — Encounter: Payer: Self-pay | Admitting: Family

## 2013-08-07 VITALS — BP 120/74 | HR 67 | Resp 16 | Wt 207.0 lb

## 2013-08-07 DIAGNOSIS — R059 Cough, unspecified: Secondary | ICD-10-CM

## 2013-08-07 DIAGNOSIS — R053 Chronic cough: Secondary | ICD-10-CM

## 2013-08-07 DIAGNOSIS — R062 Wheezing: Secondary | ICD-10-CM

## 2013-08-07 DIAGNOSIS — R05 Cough: Secondary | ICD-10-CM

## 2013-08-07 DIAGNOSIS — J309 Allergic rhinitis, unspecified: Secondary | ICD-10-CM

## 2013-08-07 MED ORDER — PREDNISONE 20 MG PO TABS: ORAL_TABLET | ORAL | Status: AC

## 2013-08-07 MED ORDER — MONTELUKAST SODIUM 10 MG PO TABS: 10.0000 mg | ORAL_TABLET | Freq: Every day | ORAL | Status: DC

## 2013-08-07 MED ORDER — BUDESONIDE-FORMOTEROL FUMARATE 80-4.5 MCG/ACT IN AERO: 2.0000 | INHALATION_SPRAY | Freq: Two times a day (BID) | RESPIRATORY_TRACT | Status: DC

## 2013-08-07 MED ORDER — ALBUTEROL SULFATE (2.5 MG/3ML) 0.083% IN NEBU
2.5000 mg | INHALATION_SOLUTION | Freq: Once | RESPIRATORY_TRACT | Status: AC
  Administered 2013-08-07: 2.5 mg via RESPIRATORY_TRACT

## 2013-08-07 NOTE — Progress Notes (Signed)
Pre visit review using our clinic review tool, if applicable. No additional management support is needed unless otherwise documented below in the visit note. 

## 2013-08-07 NOTE — Patient Instructions (Signed)
Allergic Rhinitis Allergic rhinitis is when the mucous membranes in the nose respond to allergens. Allergens are particles in the air that cause your body to have an allergic reaction. This causes you to release allergic antibodies. Through a chain of events, these eventually cause you to release histamine into the blood stream. Although meant to protect the body, it is this release of histamine that causes your discomfort, such as frequent sneezing, congestion, and an itchy, runny nose.  CAUSES  Seasonal allergic rhinitis (hay fever) is caused by pollen allergens that may come from grasses, trees, and weeds. Year-round allergic rhinitis (perennial allergic rhinitis) is caused by allergens such as house dust mites, pet dander, and mold spores.  SYMPTOMS   Nasal stuffiness (congestion).  Itchy, runny nose with sneezing and tearing of the eyes. DIAGNOSIS  Your health care provider can help you determine the allergen or allergens that trigger your symptoms. If you and your health care provider are unable to determine the allergen, skin or blood testing may be used. TREATMENT  Allergic Rhinitis does not have a cure, but it can be controlled by:  Medicines and allergy shots (immunotherapy).  Avoiding the allergen. Hay fever may often be treated with antihistamines in pill or nasal spray forms. Antihistamines block the effects of histamine. There are over-the-counter medicines that may help with nasal congestion and swelling around the eyes. Check with your health care provider before taking or giving this medicine.  If avoiding the allergen or the medicine prescribed do not work, there are many new medicines your health care provider can prescribe. Stronger medicine may be used if initial measures are ineffective. Desensitizing injections can be used if medicine and avoidance does not work. Desensitization is when a patient is given ongoing shots until the body becomes less sensitive to the allergen.  Make sure you follow up with your health care provider if problems continue. HOME CARE INSTRUCTIONS It is not possible to completely avoid allergens, but you can reduce your symptoms by taking steps to limit your exposure to them. It helps to know exactly what you are allergic to so that you can avoid your specific triggers. SEEK MEDICAL CARE IF:   You have a fever.  You develop a cough that does not stop easily (persistent).  You have shortness of breath.  You start wheezing.  Symptoms interfere with normal daily activities. Document Released: 11/07/2000 Document Revised: 12/03/2012 Document Reviewed: 10/20/2012 [facility] Patient Information 2014 [place].  Cough, Adult  A cough is a reflex that helps clear your throat and airways. It can help heal the body or may be a reaction to an irritated airway. A cough may only last 2 or 3 weeks (acute) or may last more than 8 weeks (chronic).  CAUSES Acute cough:  Viral or bacterial infections. Chronic cough:  Infections.  Allergies.  Asthma.  Post-nasal drip.  Smoking.  Heartburn or acid reflux.  Some medicines.  Chronic lung problems (COPD).  Cancer. SYMPTOMS   Cough.  Fever.  Chest pain.  Increased breathing rate.  High-pitched whistling sound when breathing (wheezing).  Colored mucus that you cough up (sputum). TREATMENT   A bacterial cough may be treated with antibiotic medicine.  A viral cough must run its course and will not respond to antibiotics.  Your caregiver may recommend other treatments if you have a chronic cough. HOME CARE INSTRUCTIONS   Only take over-the-counter or prescription medicines for pain, discomfort, or fever as directed by your caregiver. Use cough suppressants only  as directed by your caregiver.  Use a cold steam vaporizer or humidifier in your bedroom or home to help loosen secretions.  Sleep in a semi-upright position if your cough is worse at night.  Rest as  needed.  Stop smoking if you smoke. SEEK IMMEDIATE MEDICAL CARE IF:   You have pus in your sputum.  Your cough starts to worsen.  You cannot control your cough with suppressants and are losing sleep.  You begin coughing up blood.  You have difficulty breathing.  You develop pain which is getting worse or is uncontrolled with medicine.  You have a fever. MAKE SURE YOU:   Understand these instructions.  Will watch your condition.  Will get help right away if you are not doing well or get worse. Document Released: 08/11/2010 Document Revised: 05/07/2011 Document Reviewed: 08/11/2010 [facility] Patient Information 2014 [place].

## 2013-08-07 NOTE — Progress Notes (Signed)
Subjective:    Patient ID: Brandy Dunlap, female    DOB: [birth year], 63 y.o.   MRN: [id number]  HPI  63 year old non-smoking Caucasian female presents today with c/o recurrent dry, hacking cough. She is not on an Ace-Inhibitor. She has an appointment with allergist on July 7. Symptoms include sneezing, watery eyes. The cough is described as dry, hacking and is disruptive at night.  Her PMH includes GERD, for which she takes Dexilant. She reports symptoms are managed with this medication.    Review of Systems  Constitutional: Negative.   HENT: Negative.   Eyes: Negative.   Cardiovascular: Negative.   Gastrointestinal: Negative.   Endocrine: Negative.   Genitourinary: Negative.   Musculoskeletal: Negative.   Skin: Negative.   Neurological: Negative.   Hematological: Negative.   Psychiatric/Behavioral: Negative.    Past Medical History  Diagnosis Date  . Low back pain   . GERD (gastroesophageal reflux disease)   . Hypertension   . Osteoarthritis   . Sinus trouble   . Colon polyp 10/06/2003  . B12 deficiency     History   Social History  . Marital Status: Married    Spouse Name: N/A    Number of Children: 1  . Years of Education: N/A   Occupational History  . teacher    Social History Main Topics  . Smoking status: Never Smoker   . Smokeless tobacco: Never Used  . Alcohol Use: No  . Drug Use: No  . Sexual Activity: Yes   Other Topics Concern  . Not on file   Social History Narrative  . No narrative on file    Past Surgical History  Procedure Laterality Date  . Lumbar fusion  2008  . Cholecystectomy    . Tonsillectomy    . Partial hip arthroplasty Right 2009     hip replacement  . Colonoscopy w/ biopsies  10/06/2003  . Video bronchoscopy Bilateral 12/05/2012    Procedure: VIDEO BRONCHOSCOPY WITHOUT FLUORO;  Surgeon: Collene Gobble, MD;  Location: [facility];  Service: Cardiopulmonary;  Laterality: Bilateral;    Family History  Problem Relation  Age of Onset  . COPD Father     died at age 49  . Emphysema Father   . Heart disease Brother     stent with 90%  . COPD Mother   . Heart disease Mother   . Ovarian cancer Mother   . Emphysema Mother     Allergies  Allergen Reactions  . Pantoprazole Sodium Other (See Comments)    Racing heart    Current Outpatient Prescriptions on File Prior to Visit  Medication Sig Dispense Refill  . albuterol (PROVENTIL HFA;VENTOLIN HFA) 108 (90 BASE) MCG/ACT inhaler Inhale 2 puffs into the lungs every 6 (six) hours as needed for wheezing.  1 Inhaler  0  . dexlansoprazole (DEXILANT) 60 MG capsule Take 60 mg by mouth daily.      [name] gabapentin (NEURONTIN) 300 MG capsule Take 300 mg by mouth 2 (two) times daily.       [name] HYDROcodone-homatropine (HYCODAN) 5-1.5 MG/5ML syrup Take 5 mLs by mouth every 8 (eight) hours as needed for cough.  120 mL  0  . Multiple Vitamin (MULTIVITAMIN) capsule Take 1 capsule by mouth daily.        [name] PREMPRO 0.45-1.5 MG per tablet TAKE 1 TABLET DAILY  84 tablet  1  . telmisartan (MICARDIS) 80 MG tablet Take 1 tablet (80 mg total) by mouth daily.  90 tablet  3   No current facility-administered medications on file prior to visit.    BP 120/74  Pulse 67  Resp 16  Wt 207 lb (93.895 kg)  SpO2 97%chart     Objective:   Physical Exam  Constitutional: She is oriented to person, place, and time. She appears well-developed and well-nourished. No distress.  HENT:  Head: Normocephalic and atraumatic.  Eyes: Conjunctivae and EOM are normal. Pupils are equal, round, and reactive to light.  Neck: Normal range of motion. Neck supple.  Cardiovascular: Normal rate, regular rhythm and normal heart sounds.  Exam reveals no gallop and no friction rub.   Pulmonary/Chest: She has wheezes.  Diffuse inspiratory wheezes throughout all lung fields.     Neurological: She is alert and oriented to person, place, and time.  Skin: Skin is warm and dry. No rash noted. She is not diaphoretic.    Psychiatric: She has a normal mood and affect. Her behavior is normal. Judgment and thought content normal.   Relief with in-office nebulizer treatment.     Assessment & Plan:  Brandy Dunlap was seen today for cough.  Diagnoses and associated orders for this visit:  Wheezing - Discontinue: montelukast (SINGULAIR) 10 MG tablet; Take 1 tablet (10 mg total) by mouth at bedtime. - montelukast (SINGULAIR) 10 MG tablet; Take 1 tablet (10 mg total) by mouth at bedtime.  Persistent dry cough - Discontinue: montelukast (SINGULAIR) 10 MG tablet; Take 1 tablet (10 mg total) by mouth at bedtime. - montelukast (SINGULAIR) 10 MG tablet; Take 1 tablet (10 mg total) by mouth at bedtime.  Allergic rhinitis - Discontinue: montelukast (SINGULAIR) 10 MG tablet; Take 1 tablet (10 mg total) by mouth at bedtime. - montelukast (SINGULAIR) 10 MG tablet; Take 1 tablet (10 mg total) by mouth at bedtime.  Other Orders - Discontinue: budesonide-formoterol (SYMBICORT) 80-4.5 MCG/ACT inhaler; Inhale 2 puffs into the lungs 2 (two) times daily. - predniSONE (DELTASONE) 20 MG tablet; 60mg  PO qam x 3 days, 40mg  po qam x 3 days, 20mg  qam x 3 days - budesonide-formoterol (SYMBICORT) 80-4.5 MCG/ACT inhaler; Inhale 2 puffs into the lungs 2 (two) times daily.   See allergist as discussed. Call the office with any questions or concerns.

## 2013-08-13 ENCOUNTER — Ambulatory Visit: Payer: BC Managed Care – PPO | Admitting: Family

## 2013-08-24 ENCOUNTER — Telehealth: Payer: Self-pay | Admitting: Family

## 2013-08-24 DIAGNOSIS — R053 Chronic cough: Secondary | ICD-10-CM

## 2013-08-24 DIAGNOSIS — R05 Cough: Secondary | ICD-10-CM

## 2013-08-24 DIAGNOSIS — R062 Wheezing: Secondary | ICD-10-CM

## 2013-08-24 MED ORDER — MONTELUKAST SODIUM 10 MG PO TABS: 10.0000 mg | ORAL_TABLET | Freq: Every day | ORAL | Status: DC

## 2013-08-24 NOTE — Telephone Encounter (Signed)
Done

## 2013-08-24 NOTE — Telephone Encounter (Signed)
[facility] [place] is requesting montelukast (SINGULAIR) 10 MG tablet

## 2013-09-28 ENCOUNTER — Other Ambulatory Visit (INDEPENDENT_AMBULATORY_CARE_PROVIDER_SITE_OTHER): Payer: BC Managed Care – PPO

## 2013-09-28 DIAGNOSIS — Z0000 Encounter for general adult medical examination without abnormal findings: Secondary | ICD-10-CM

## 2013-09-28 LAB — CBC WITH DIFFERENTIAL/PLATELET
Basophils Absolute: 0.1 10*3/uL (ref 0.0–0.1)
Basophils Relative: 0.8 % (ref 0.0–3.0)
Eosinophils Absolute: 0.3 10*3/uL (ref 0.0–0.7)
Eosinophils Relative: 4.2 % (ref 0.0–5.0)
HCT: 37.3 % (ref 36.0–46.0)
Hemoglobin: 12.5 g/dL (ref 12.0–15.0)
Lymphocytes Relative: 42.1 % (ref 12.0–46.0)
Lymphs Abs: 2.8 10*3/uL (ref 0.7–4.0)
MCHC: 33.5 g/dL (ref 30.0–36.0)
MCV: 89.1 fl (ref 78.0–100.0)
Monocytes Absolute: 0.4 10*3/uL (ref 0.1–1.0)
Monocytes Relative: 6.7 % (ref 3.0–12.0)
Neutro Abs: 3 10*3/uL (ref 1.4–7.7)
Neutrophils Relative %: 46.2 % (ref 43.0–77.0)
Platelets: 278 10*3/uL (ref 150.0–400.0)
RBC: 4.19 Mil/uL (ref 3.87–5.11)
RDW: 14.8 % (ref 11.5–15.5)
WBC: 6.6 10*3/uL (ref 4.0–10.5)

## 2013-09-28 LAB — LIPID PANEL
Cholesterol: 189 mg/dL (ref 0–200)
HDL: 58.8 mg/dL (ref >39.00)
LDL Cholesterol: 102 mg/dL — ABNORMAL HIGH (ref 0–99)
NonHDL: 130.2
Total CHOL/HDL Ratio: 3
Triglycerides: 140 mg/dL (ref 0.0–149.0)
VLDL: 28 mg/dL (ref 0.0–40.0)

## 2013-09-28 LAB — POCT URINALYSIS DIPSTICK
Bilirubin, UA: NEGATIVE
Blood, UA: NEGATIVE
Glucose, UA: NEGATIVE
Ketones, UA: NEGATIVE
Nitrite, UA: NEGATIVE
Spec Grav, UA: 1.015
Urobilinogen, UA: 0.2
pH, UA: 7

## 2013-09-28 LAB — BASIC METABOLIC PANEL
BUN: 10 mg/dL (ref 6–23)
CO2: 23 mEq/L (ref 19–32)
Calcium: 8.8 mg/dL (ref 8.4–10.5)
Chloride: 102 mEq/L (ref 96–112)
Creatinine, Ser: 0.8 mg/dL (ref 0.4–1.2)
GFR: 77.02 mL/min (ref >60.00)
Glucose, Bld: 88 mg/dL (ref 70–99)
Potassium: 4.6 mEq/L (ref 3.5–5.1)
Sodium: 135 mEq/L (ref 135–145)

## 2013-09-28 LAB — HEPATIC FUNCTION PANEL
ALT: 15 U/L (ref 0–35)
AST: 16 U/L (ref 0–37)
Albumin: 3.5 g/dL (ref 3.5–5.2)
Alkaline Phosphatase: 46 U/L (ref 39–117)
Bilirubin, Direct: 0 mg/dL (ref 0.0–0.3)
Total Bilirubin: 0.4 mg/dL (ref 0.2–1.2)
Total Protein: 6.8 g/dL (ref 6.0–8.3)

## 2013-09-28 LAB — TSH: TSH: 1.03 u[IU]/mL (ref 0.35–4.50)

## 2013-10-05 ENCOUNTER — Ambulatory Visit (INDEPENDENT_AMBULATORY_CARE_PROVIDER_SITE_OTHER): Payer: BC Managed Care – PPO | Admitting: Family

## 2013-10-05 ENCOUNTER — Encounter: Payer: Self-pay | Admitting: Family

## 2013-10-05 ENCOUNTER — Encounter: Payer: BC Managed Care – PPO | Admitting: Internal Medicine

## 2013-10-05 VITALS — BP 120/80 | HR 83 | Ht 62.0 in | Wt 209.0 lb

## 2013-10-05 DIAGNOSIS — A499 Bacterial infection, unspecified: Secondary | ICD-10-CM

## 2013-10-05 DIAGNOSIS — Z1231 Encounter for screening mammogram for malignant neoplasm of breast: Secondary | ICD-10-CM

## 2013-10-05 DIAGNOSIS — B9689 Other specified bacterial agents as the cause of diseases classified elsewhere: Secondary | ICD-10-CM

## 2013-10-05 DIAGNOSIS — Z0000 Encounter for general adult medical examination without abnormal findings: Secondary | ICD-10-CM

## 2013-10-05 DIAGNOSIS — I10 Essential (primary) hypertension: Secondary | ICD-10-CM

## 2013-10-05 DIAGNOSIS — N760 Acute vaginitis: Secondary | ICD-10-CM

## 2013-10-05 DIAGNOSIS — J309 Allergic rhinitis, unspecified: Secondary | ICD-10-CM

## 2013-10-05 MED ORDER — CONJ ESTROG-MEDROXYPROGEST ACE 0.45-1.5 MG PO TABS: ORAL_TABLET | ORAL | Status: DC

## 2013-10-05 MED ORDER — TELMISARTAN 80 MG PO TABS: 80.0000 mg | ORAL_TABLET | Freq: Every day | ORAL | Status: DC

## 2013-10-05 MED ORDER — METRONIDAZOLE 500 MG PO TABS: 500.0000 mg | ORAL_TABLET | Freq: Two times a day (BID) | ORAL | Status: DC

## 2013-10-05 NOTE — Progress Notes (Signed)
Pre visit review using our clinic review tool, if applicable. No additional management support is needed unless otherwise documented below in the visit note. 

## 2013-10-05 NOTE — Patient Instructions (Signed)
Bacterial Vaginosis Bacterial vaginosis is a vaginal infection that occurs when the normal balance of bacteria in the vagina is disrupted. It results from an overgrowth of certain bacteria. This is the most common vaginal infection in women of childbearing age. Treatment is important to prevent complications, especially in pregnant women, as it can cause a premature delivery. CAUSES  Bacterial vaginosis is caused by an increase in harmful bacteria that are normally present in smaller amounts in the vagina. Several different kinds of bacteria can cause bacterial vaginosis. However, the reason that the condition develops is not fully understood. RISK FACTORS Certain activities or behaviors can put you at an increased risk of developing bacterial vaginosis, including:  Having a new sex partner or multiple sex partners.  Douching.  Using an intrauterine device (IUD) for contraception. Women do not get bacterial vaginosis from toilet seats, bedding, swimming pools, or contact with objects around them. SIGNS AND SYMPTOMS  Some women with bacterial vaginosis have no signs or symptoms. Common symptoms include:  Grey vaginal discharge.  A fishlike odor with discharge, especially after sexual intercourse.  Itching or burning of the vagina and vulva.  Burning or pain with urination. DIAGNOSIS  Your health care provider will take a medical history and examine the vagina for signs of bacterial vaginosis. A sample of vaginal fluid may be taken. Your health care provider will look at this sample under a microscope to check for bacteria and abnormal cells. A vaginal pH test may also be done.  TREATMENT  Bacterial vaginosis may be treated with antibiotic medicines. These may be given in the form of a pill or a vaginal cream. A second round of antibiotics may be prescribed if the condition comes back after treatment.  HOME CARE INSTRUCTIONS   Only take over-the-counter or prescription medicines as  directed by your health care provider.  If antibiotic medicine was prescribed, take it as directed. Make sure you finish it even if you start to feel better.  Do not have sex until treatment is completed.  Tell all sexual partners that you have a vaginal infection. They should see their health care provider and be treated if they have problems, such as a mild rash or itching.  Practice safe sex by using condoms and only having one sex partner. SEEK MEDICAL CARE IF:   Your symptoms are not improving after 3 days of treatment.  You have increased discharge or pain.  You have a fever. MAKE SURE YOU:   Understand these instructions.  Will watch your condition.  Will get help right away if you are not doing well or get worse. FOR MORE INFORMATION  Centers for Disease Control and Prevention, Division of STD Prevention: www.cdc.gov/std American Sexual Health Association (ASHA): www.ashastd.org  Document Released: 02/12/2005 Document Revised: 12/03/2012 Document Reviewed: 09/24/2012 [employer] Patient Information 2015 [employer]. This information is not intended to replace advice given to you by your health care provider. Make sure you discuss any questions you have with your health care provider.  

## 2013-10-05 NOTE — Progress Notes (Signed)
Subjective:    Patient ID: Brandy Dunlap, female    DOB: 23-Jun-1950, 63 y.o.   MRN: [id number]  HPI 63 year old white female, nonsmoker with a history of hypertension, osteoarthritis, and chronic bronchitis is in today for complete physical exam. She is due to undergo total knee replacement of the right knee and requesting surgical clearance. Has had several cortisone injections and eventually stop working. Also reports having vaginal discharge and odor times one month she describes as fishy. She's not sexually active. Denies any thick discharge or itchiness.   Review of Systems  Constitutional: Negative.   HENT: Negative.   Eyes: Negative.   Respiratory: Negative.   Cardiovascular: Negative.   Gastrointestinal: Negative.   Endocrine: Negative.   Genitourinary: Positive for vaginal discharge. Negative for hematuria, vaginal bleeding, genital sores and vaginal pain.  Musculoskeletal: Positive for arthralgias.       Right knee pain  Skin: Negative.   Allergic/Immunologic: Negative.   Neurological: Negative.   Hematological: Negative.   Psychiatric/Behavioral: Negative.    Past Medical History  Diagnosis Date  . Low back pain   . GERD (gastroesophageal reflux disease)   . Hypertension   . Osteoarthritis   . Sinus trouble   . Colon polyp 10/06/2003  . B12 deficiency     History   Social History  . Marital Status: Married    Spouse Name: N/A    Number of Children: 1  . Years of Education: N/A   Occupational History  . teacher    Social History Main Topics  . Smoking status: Never Smoker   . Smokeless tobacco: Never Used  . Alcohol Use: No  . Drug Use: No  . Sexual Activity: Yes   Other Topics Concern  . Not on file   Social History Narrative  . No narrative on file    Past Surgical History  Procedure Laterality Date  . Lumbar fusion  2008  . Cholecystectomy    . Tonsillectomy    . Partial hip arthroplasty Right 2009     hip replacement  . Colonoscopy  w/ biopsies  10/06/2003  . Video bronchoscopy Bilateral 12/05/2012    Procedure: VIDEO BRONCHOSCOPY WITHOUT FLUORO;  Surgeon: Collene Gobble, MD;  Location: [facility];  Service: Cardiopulmonary;  Laterality: Bilateral;    Family History  Problem Relation Age of Onset  . COPD Father     died at age 44  . Emphysema Father   . Heart disease Brother     stent with 90%  . COPD Mother   . Heart disease Mother   . Ovarian cancer Mother   . Emphysema Mother     Allergies  Allergen Reactions  . Pantoprazole Sodium Other (See Comments)    Racing heart    Current Outpatient Prescriptions on File Prior to Visit  Medication Sig Dispense Refill  . albuterol (PROVENTIL HFA;VENTOLIN HFA) 108 (90 BASE) MCG/ACT inhaler Inhale 2 puffs into the lungs every 6 (six) hours as needed for wheezing.  1 Inhaler  0  . dexlansoprazole (DEXILANT) 60 MG capsule Take 60 mg by mouth daily.      [name] gabapentin (NEURONTIN) 300 MG capsule Take 300 mg by mouth 2 (two) times daily.       . montelukast (SINGULAIR) 10 MG tablet Take 1 tablet (10 mg total) by mouth at bedtime.  90 tablet  0  . Multiple Vitamin (MULTIVITAMIN) capsule Take 1 capsule by mouth daily.         No  current facility-administered medications on file prior to visit.    BP 120/80  Pulse 83  Ht 5\' 2"  (1.575 m)  Wt 209 lb (94.802 kg)  BMI 38.22 kg/m2chart    Objective:   Physical Exam  Constitutional: She is oriented to person, place, and time. She appears well-developed and well-nourished.  HENT:  Head: Normocephalic.  Right Ear: External ear normal.  Left Ear: External ear normal.  Nose: Nose normal.  Mouth/Throat: Oropharynx is clear and moist.  Eyes: Conjunctivae and EOM are normal. Pupils are equal, round, and reactive to light.  Neck: Normal range of motion. Neck supple. No thyromegaly present.  Cardiovascular: Normal rate, regular rhythm and normal heart sounds.   Pulmonary/Chest: Effort normal and breath sounds normal.    Abdominal: Soft. Bowel sounds are normal.  Genitourinary:  Declined  Musculoskeletal: Normal range of motion. She exhibits no edema.  Neurological: She is alert and oriented to person, place, and time. She has normal reflexes. She displays normal reflexes. No cranial nerve deficit. Coordination normal.  Skin: Skin is dry.  Psychiatric: She has a normal mood and affect.          Assessment & Plan:  Brandy Dunlap was seen today for annual exam.  Diagnoses and associated orders for this visit:  Preventative health care - EKG 12-Lead  Unspecified essential hypertension - telmisartan (MICARDIS) 80 MG tablet; Take 1 tablet (80 mg total) by mouth daily.  Allergic rhinitis, unspecified allergic rhinitis type  Bacterial vaginosis  Essential hypertension - telmisartan (MICARDIS) 80 MG tablet; Take 1 tablet (80 mg total) by mouth daily. - EKG 12-Lead  HYPERTENSION - telmisartan (MICARDIS) 80 MG tablet; Take 1 tablet (80 mg total) by mouth daily.  Screening mammogram for high-risk patient - [facility] DIGITAL SCREENING BILATERAL; Future  Other Orders - estrogen, conjugated,-medroxyprogesterone (PREMPRO) 0.45-1.5 MG per tablet; TAKE 1 TABLET DAILY - metroNIDAZOLE (FLAGYL) 500 MG tablet; Take 1 tablet (500 mg total) by mouth 2 (two) times daily.   Call the office with any questions or concerns. Recheck in 6 months and sooner as needed.[name]

## 2013-10-14 ENCOUNTER — Telehealth: Payer: Self-pay | Admitting: Family

## 2013-10-14 MED ORDER — DEXLANSOPRAZOLE 60 MG PO CPDR: 60.0000 mg | DELAYED_RELEASE_CAPSULE | Freq: Every day | ORAL | Status: DC

## 2013-10-14 NOTE — Telephone Encounter (Signed)
[facility] is requesting re-fill ondexlansoprazole (DEXILANT) 60 MG capsule

## 2013-10-14 NOTE — Telephone Encounter (Signed)
Done

## 2013-10-20 ENCOUNTER — Other Ambulatory Visit: Payer: Self-pay | Admitting: Family

## 2013-10-20 ENCOUNTER — Ambulatory Visit
Admission: RE | Admit: 2013-10-20 | Discharge: 2013-10-20 | Disposition: A | Discharge status: Home / Self Care | Payer: BC Managed Care – PPO | Source: Ambulatory Visit | Attending: Family | Admitting: Family

## 2013-10-20 ENCOUNTER — Other Ambulatory Visit: Payer: Self-pay | Admitting: Orthopedic Surgery

## 2013-10-20 DIAGNOSIS — Z1231 Encounter for screening mammogram for malignant neoplasm of breast: Secondary | ICD-10-CM

## 2013-10-21 ENCOUNTER — Other Ambulatory Visit: Payer: Self-pay

## 2013-10-21 MED ORDER — DEXLANSOPRAZOLE 60 MG PO CPDR: 60.0000 mg | DELAYED_RELEASE_CAPSULE | Freq: Every day | ORAL | Status: DC

## 2013-11-04 ENCOUNTER — Encounter (HOSPITAL_COMMUNITY): Payer: Self-pay | Admitting: Pharmacy Technician

## 2013-11-05 ENCOUNTER — Encounter (HOSPITAL_COMMUNITY)
Admission: RE | Admit: 2013-11-05 | Discharge: 2013-11-05 | Disposition: A | Discharge status: Home / Self Care | Payer: BC Managed Care – PPO | Source: Ambulatory Visit | Attending: Orthopedic Surgery | Admitting: Orthopedic Surgery

## 2013-11-05 ENCOUNTER — Encounter (HOSPITAL_COMMUNITY): Payer: Self-pay

## 2013-11-05 DIAGNOSIS — Z01812 Encounter for preprocedural laboratory examination: Secondary | ICD-10-CM | POA: Insufficient documentation

## 2013-11-05 DIAGNOSIS — M1710 Unilateral primary osteoarthritis, unspecified knee: Secondary | ICD-10-CM | POA: Diagnosis not present

## 2013-11-05 HISTORY — DX: Bronchitis, not specified as acute or chronic: J40

## 2013-11-05 LAB — CBC WITH DIFFERENTIAL/PLATELET
Basophils Absolute: 0.1 10*3/uL (ref 0.0–0.1)
Basophils Relative: 1 % (ref 0–1)
Eosinophils Absolute: 0.2 10*3/uL (ref 0.0–0.7)
Eosinophils Relative: 4 % (ref 0–5)
HCT: 37.4 % (ref 36.0–46.0)
Hemoglobin: 12.6 g/dL (ref 12.0–15.0)
Lymphocytes Relative: 41 % (ref 12–46)
Lymphs Abs: 2.5 10*3/uL (ref 0.7–4.0)
MCH: 29.9 pg (ref 26.0–34.0)
MCHC: 33.7 g/dL (ref 30.0–36.0)
MCV: 88.6 fL (ref 78.0–100.0)
Monocytes Absolute: 0.5 10*3/uL (ref 0.1–1.0)
Monocytes Relative: 8 % (ref 3–12)
Neutro Abs: 2.8 10*3/uL (ref 1.7–7.7)
Neutrophils Relative %: 46 % (ref 43–77)
Platelets: 295 10*3/uL (ref 150–400)
RBC: 4.22 MIL/uL (ref 3.87–5.11)
RDW: 14.3 % (ref 11.5–15.5)
WBC: 6.1 10*3/uL (ref 4.0–10.5)

## 2013-11-05 LAB — URINALYSIS, ROUTINE W REFLEX MICROSCOPIC
Bilirubin Urine: NEGATIVE
Glucose, UA: NEGATIVE mg/dL
Ketones, ur: NEGATIVE mg/dL
Nitrite: NEGATIVE
Protein, ur: NEGATIVE mg/dL
Specific Gravity, Urine: 1.01 (ref 1.005–1.030)
Urobilinogen, UA: 0.2 mg/dL (ref 0.0–1.0)
pH: 5.5 (ref 5.0–8.0)

## 2013-11-05 LAB — COMPREHENSIVE METABOLIC PANEL
ALT: 17 U/L (ref 0–35)
AST: 18 U/L (ref 0–37)
Albumin: 3.6 g/dL (ref 3.5–5.2)
Alkaline Phosphatase: 56 U/L (ref 39–117)
Anion gap: 13 (ref 5–15)
BUN: 10 mg/dL (ref 6–23)
CO2: 23 mEq/L (ref 19–32)
Calcium: 9.1 mg/dL (ref 8.4–10.5)
Chloride: 103 mEq/L (ref 96–112)
Creatinine, Ser: 0.72 mg/dL (ref 0.50–1.10)
GFR calc Af Amer: >90 mL/min (ref >90)
GFR calc non Af Amer: 89 mL/min — ABNORMAL LOW (ref >90)
Glucose, Bld: 88 mg/dL (ref 70–99)
Potassium: 4.1 mEq/L (ref 3.7–5.3)
Sodium: 139 mEq/L (ref 137–147)
Total Bilirubin: 0.3 mg/dL (ref 0.3–1.2)
Total Protein: 6.9 g/dL (ref 6.0–8.3)

## 2013-11-05 LAB — URINE MICROSCOPIC-ADD ON

## 2013-11-05 LAB — SURGICAL PCR SCREEN
MRSA, PCR: NEGATIVE
Staphylococcus aureus: POSITIVE — AB

## 2013-11-05 LAB — PROTIME-INR
INR: 0.99 (ref 0.00–1.49)
Prothrombin Time: 13.1 seconds (ref 11.6–15.2)

## 2013-11-05 LAB — APTT: aPTT: 28 seconds (ref 24–37)

## 2013-11-05 MED ORDER — CHLORHEXIDINE GLUCONATE 4 % EX LIQD: 60.0000 mL | Freq: Once | CUTANEOUS | Status: DC

## 2013-11-05 NOTE — Pre-Procedure Instructions (Signed)
Brandy Dunlap  11/05/2013   Your procedure is scheduled on:  11/16/13  Report to [facility] Admitting at 8 AM.  Call this number if you have problems the morning of surgery: [phone]   Remember:   Do not eat food or drink liquids after midnight.   Take these medicines the morning of surgery with A SIP OF WATER: all inhalers,estrogen   Do not wear jewelry, make-up or nail polish.  Do not wear lotions, powders, or perfumes. You may wear deodorant.  Do not shave 48 hours prior to surgery. Men may shave face and neck.  Do not bring valuables to the hospital.  [facility] is not responsible                  for any belongings or valuables.               Contacts, dentures or bridgework may not be worn into surgery.  Leave suitcase in the car. After surgery it may be brought to your room.  For patients admitted to the hospital, discharge time is determined by your                treatment team.               Patients discharged the day of surgery will not be allowed to drive  home.  Name and phone number of your driver: family  Special Instructions: Shower using CHG 2 nights before surgery and the night before surgery.  If you shower the day of surgery use CHG.  Use special wash - you have one bottle of CHG for all showers.  You should use approximately 1/3 of the bottle for each shower.   Please read over the following fact sheets that you were given: Pain Booklet, Coughing and Deep Breathing, Blood Transfusion Information, Total Joint Packet, MRSA Information and Surgical Site Infection Prevention

## 2013-11-06 DIAGNOSIS — M1711 Unilateral primary osteoarthritis, right knee: Secondary | ICD-10-CM | POA: Insufficient documentation

## 2013-11-09 ENCOUNTER — Encounter: Payer: Self-pay | Admitting: Internal Medicine

## 2013-11-15 MED ORDER — CEFAZOLIN SODIUM-DEXTROSE 2-3 GM-% IV SOLR
2.0000 g | INTRAVENOUS | Status: AC
  Administered 2013-11-16: 2 g via INTRAVENOUS
  Filled 2013-11-15: qty 50

## 2013-11-15 MED ORDER — TRANEXAMIC ACID 100 MG/ML IV SOLN
1000.0000 mg | INTRAVENOUS | Status: AC
  Administered 2013-11-16: 1000 mg via INTRAVENOUS
  Filled 2013-11-15 (×2): qty 10

## 2013-11-15 MED ORDER — BUPIVACAINE LIPOSOME 1.3 % IJ SUSP
20.0000 mL | Freq: Once | INTRAMUSCULAR | Status: DC
  Filled 2013-11-15 (×2): qty 20

## 2013-11-16 ENCOUNTER — Encounter (HOSPITAL_COMMUNITY)
Admission: RE | Disposition: A | Discharge status: Home Health | Payer: Self-pay | Source: Ambulatory Visit | Attending: Orthopedic Surgery

## 2013-11-16 ENCOUNTER — Encounter (HOSPITAL_COMMUNITY): Payer: BC Managed Care – PPO | Admitting: Anesthesiology

## 2013-11-16 ENCOUNTER — Inpatient Hospital Stay (HOSPITAL_COMMUNITY)
Admission: RE | Admit: 2013-11-16 | Discharge: 2013-11-18 | DRG: 470 | Disposition: A | Discharge status: Home Health | Payer: BC Managed Care – PPO | Source: Ambulatory Visit | Attending: Orthopedic Surgery | Admitting: Orthopedic Surgery

## 2013-11-16 ENCOUNTER — Encounter (HOSPITAL_COMMUNITY): Payer: Self-pay | Admitting: *Deleted

## 2013-11-16 ENCOUNTER — Ambulatory Visit (HOSPITAL_COMMUNITY): Payer: BC Managed Care – PPO | Admitting: Anesthesiology

## 2013-11-16 DIAGNOSIS — I10 Essential (primary) hypertension: Secondary | ICD-10-CM | POA: Diagnosis present

## 2013-11-16 DIAGNOSIS — Z96659 Presence of unspecified artificial knee joint: Secondary | ICD-10-CM

## 2013-11-16 DIAGNOSIS — M1710 Unilateral primary osteoarthritis, unspecified knee: Secondary | ICD-10-CM | POA: Diagnosis present

## 2013-11-16 DIAGNOSIS — K219 Gastro-esophageal reflux disease without esophagitis: Secondary | ICD-10-CM | POA: Diagnosis present

## 2013-11-16 DIAGNOSIS — Z981 Arthrodesis status: Secondary | ICD-10-CM | POA: Diagnosis not present

## 2013-11-16 DIAGNOSIS — D62 Acute posthemorrhagic anemia: Secondary | ICD-10-CM | POA: Diagnosis not present

## 2013-11-16 DIAGNOSIS — Z96649 Presence of unspecified artificial hip joint: Secondary | ICD-10-CM

## 2013-11-16 DIAGNOSIS — R112 Nausea with vomiting, unspecified: Secondary | ICD-10-CM | POA: Diagnosis not present

## 2013-11-16 DIAGNOSIS — M25569 Pain in unspecified knee: Secondary | ICD-10-CM | POA: Diagnosis present

## 2013-11-16 DIAGNOSIS — Z96651 Presence of right artificial knee joint: Secondary | ICD-10-CM

## 2013-11-16 HISTORY — DX: Presence of unspecified artificial knee joint: Z96.659

## 2013-11-16 HISTORY — PX: TOTAL KNEE ARTHROPLASTY: SHX125

## 2013-11-16 LAB — TYPE AND SCREEN
ABO/RH(D): A POS
Antibody Screen: NEGATIVE

## 2013-11-16 LAB — CBC
HCT: 34.5 % — ABNORMAL LOW (ref 36.0–46.0)
Hemoglobin: 11.4 g/dL — ABNORMAL LOW (ref 12.0–15.0)
MCH: 29.1 pg (ref 26.0–34.0)
MCHC: 33 g/dL (ref 30.0–36.0)
MCV: 88 fL (ref 78.0–100.0)
Platelets: 281 10*3/uL (ref 150–400)
RBC: 3.92 MIL/uL (ref 3.87–5.11)
RDW: 14.6 % (ref 11.5–15.5)
WBC: 11.7 10*3/uL — ABNORMAL HIGH (ref 4.0–10.5)

## 2013-11-16 LAB — CREATININE, SERUM
Creatinine, Ser: 0.69 mg/dL (ref 0.50–1.10)
GFR calc Af Amer: >90 mL/min (ref >90)
GFR calc non Af Amer: >90 mL/min (ref >90)

## 2013-11-16 SURGERY — ARTHROPLASTY, KNEE, TOTAL
Anesthesia: General | Site: Knee | Laterality: Right

## 2013-11-16 MED ORDER — BISACODYL 5 MG PO TBEC: 5.0000 mg | DELAYED_RELEASE_TABLET | Freq: Every day | ORAL | Status: DC | PRN

## 2013-11-16 MED ORDER — METHOCARBAMOL 1000 MG/10ML IJ SOLN
500.0000 mg | Freq: Four times a day (QID) | INTRAMUSCULAR | Status: DC | PRN
  Administered 2013-11-16: 500 mg via INTRAVENOUS
  Filled 2013-11-16: qty 5

## 2013-11-16 MED ORDER — PROPOFOL 10 MG/ML IV BOLUS
INTRAVENOUS | Status: DC | PRN
  Administered 2013-11-16: 150 mg via INTRAVENOUS
  Administered 2013-11-16: 50 mg via INTRAVENOUS

## 2013-11-16 MED ORDER — FENTANYL CITRATE 0.05 MG/ML IJ SOLN
INTRAMUSCULAR | Status: AC
  Administered 2013-11-16: 10:00:00
  Filled 2013-11-16: qty 2

## 2013-11-16 MED ORDER — ARTIFICIAL TEARS OP OINT
TOPICAL_OINTMENT | OPHTHALMIC | Status: DC | PRN
  Administered 2013-11-16: 1 via OPHTHALMIC

## 2013-11-16 MED ORDER — BUPIVACAINE-EPINEPHRINE (PF) 0.5% -1:200000 IJ SOLN
INTRAMUSCULAR | Status: AC
  Filled 2013-11-16: qty 30

## 2013-11-16 MED ORDER — ROCURONIUM BROMIDE 100 MG/10ML IV SOLN
INTRAVENOUS | Status: DC | PRN
  Administered 2013-11-16: 50 mg via INTRAVENOUS

## 2013-11-16 MED ORDER — ZOLPIDEM TARTRATE 5 MG PO TABS: 5.0000 mg | ORAL_TABLET | Freq: Every evening | ORAL | Status: DC | PRN

## 2013-11-16 MED ORDER — ENOXAPARIN SODIUM 30 MG/0.3ML ~~LOC~~ SOLN
30.0000 mg | Freq: Two times a day (BID) | SUBCUTANEOUS | Status: DC
  Administered 2013-11-17 (×2): 30 mg via SUBCUTANEOUS
  Filled 2013-11-16 (×5): qty 0.3

## 2013-11-16 MED ORDER — SODIUM CHLORIDE 0.9 % IV SOLN
INTRAVENOUS | Status: DC
  Administered 2013-11-16: 22:00:00 via INTRAVENOUS

## 2013-11-16 MED ORDER — MEDROXYPROGESTERONE ACETATE 2.5 MG PO TABS
1.2500 mg | ORAL_TABLET | Freq: Every day | ORAL | Status: DC
  Administered 2013-11-16 – 2013-11-17 (×2): 1.25 mg via ORAL
  Filled 2013-11-16 (×3): qty 0.5

## 2013-11-16 MED ORDER — OXYCODONE HCL ER 10 MG PO T12A
10.0000 mg | EXTENDED_RELEASE_TABLET | Freq: Two times a day (BID) | ORAL | Status: DC
  Administered 2013-11-16 – 2013-11-17 (×2): 10 mg via ORAL
  Filled 2013-11-16 (×2): qty 1

## 2013-11-16 MED ORDER — ONDANSETRON HCL 4 MG/2ML IJ SOLN
INTRAMUSCULAR | Status: AC
  Filled 2013-11-16: qty 2

## 2013-11-16 MED ORDER — FLEET ENEMA 7-19 GM/118ML RE ENEM
1.0000 | ENEMA | Freq: Once | RECTAL | Status: AC | PRN
Start: 2013-11-16 — End: 2013-11-16

## 2013-11-16 MED ORDER — FENTANYL CITRATE 0.05 MG/ML IJ SOLN
INTRAMUSCULAR | Status: DC | PRN
  Administered 2013-11-16 (×5): 50 ug via INTRAVENOUS

## 2013-11-16 MED ORDER — METHOCARBAMOL 500 MG PO TABS
500.0000 mg | ORAL_TABLET | Freq: Four times a day (QID) | ORAL | Status: DC | PRN
  Administered 2013-11-16 – 2013-11-18 (×5): 500 mg via ORAL
  Filled 2013-11-16 (×6): qty 1

## 2013-11-16 MED ORDER — SENNOSIDES-DOCUSATE SODIUM 8.6-50 MG PO TABS: 1.0000 | ORAL_TABLET | Freq: Every evening | ORAL | Status: DC | PRN

## 2013-11-16 MED ORDER — ACETAMINOPHEN 10 MG/ML IV SOLN
INTRAVENOUS | Status: DC | PRN
  Administered 2013-11-16: 1000 mg via INTRAVENOUS

## 2013-11-16 MED ORDER — ACETAMINOPHEN 325 MG PO TABS: 650.0000 mg | ORAL_TABLET | Freq: Four times a day (QID) | ORAL | Status: DC | PRN

## 2013-11-16 MED ORDER — MIDAZOLAM HCL 5 MG/ML IJ SOLN: 2.0000 mg | Freq: Once | INTRAMUSCULAR | Status: DC

## 2013-11-16 MED ORDER — BUPIVACAINE-EPINEPHRINE 0.5% -1:200000 IJ SOLN
INTRAMUSCULAR | Status: DC | PRN
  Administered 2013-11-16: 20 mL

## 2013-11-16 MED ORDER — GLYCOPYRROLATE 0.2 MG/ML IJ SOLN
INTRAMUSCULAR | Status: DC | PRN
  Administered 2013-11-16: 0.2 mg via INTRAVENOUS
  Administered 2013-11-16: 0.4 mg via INTRAVENOUS

## 2013-11-16 MED ORDER — IRBESARTAN 300 MG PO TABS
300.0000 mg | ORAL_TABLET | Freq: Every day | ORAL | Status: DC
  Administered 2013-11-17: 300 mg via ORAL
  Filled 2013-11-16 (×3): qty 1

## 2013-11-16 MED ORDER — ESTROGENS CONJUGATED 0.45 MG PO TABS
0.4500 mg | ORAL_TABLET | Freq: Every day | ORAL | Status: DC
  Administered 2013-11-16 – 2013-11-17 (×2): 0.45 mg via ORAL
  Filled 2013-11-16 (×3): qty 1

## 2013-11-16 MED ORDER — CEFAZOLIN SODIUM-DEXTROSE 2-3 GM-% IV SOLR
2.0000 g | Freq: Four times a day (QID) | INTRAVENOUS | Status: AC
  Administered 2013-11-16 – 2013-11-17 (×2): 2 g via INTRAVENOUS
  Filled 2013-11-16 (×3): qty 50

## 2013-11-16 MED ORDER — PROMETHAZINE HCL 25 MG/ML IJ SOLN
6.2500 mg | INTRAMUSCULAR | Status: DC | PRN
  Administered 2013-11-16: 6.25 mg via INTRAVENOUS

## 2013-11-16 MED ORDER — FENTANYL CITRATE 0.05 MG/ML IJ SOLN
INTRAMUSCULAR | Status: AC
  Filled 2013-11-16: qty 5

## 2013-11-16 MED ORDER — DIPHENHYDRAMINE HCL 12.5 MG/5ML PO ELIX: 12.5000 mg | ORAL_SOLUTION | ORAL | Status: DC | PRN

## 2013-11-16 MED ORDER — HYDROMORPHONE HCL 1 MG/ML IJ SOLN
INTRAMUSCULAR | Status: AC
  Administered 2013-11-16: 13:00:00
  Filled 2013-11-16: qty 1

## 2013-11-16 MED ORDER — LIDOCAINE HCL (CARDIAC) 20 MG/ML IV SOLN
INTRAVENOUS | Status: DC | PRN
  Administered 2013-11-16: 80 mg via INTRAVENOUS

## 2013-11-16 MED ORDER — ONDANSETRON HCL 4 MG PO TABS: 4.0000 mg | ORAL_TABLET | Freq: Four times a day (QID) | ORAL | Status: DC | PRN

## 2013-11-16 MED ORDER — PROPOFOL 10 MG/ML IV BOLUS
INTRAVENOUS | Status: AC
  Filled 2013-11-16: qty 20

## 2013-11-16 MED ORDER — ALUM & MAG HYDROXIDE-SIMETH 200-200-20 MG/5ML PO SUSP: 30.0000 mL | ORAL | Status: DC | PRN

## 2013-11-16 MED ORDER — ONDANSETRON HCL 4 MG/2ML IJ SOLN
INTRAMUSCULAR | Status: DC | PRN
  Administered 2013-11-16: 4 mg via INTRAVENOUS

## 2013-11-16 MED ORDER — FENTANYL CITRATE 0.05 MG/ML IJ SOLN
100.0000 ug | Freq: Once | INTRAMUSCULAR | Status: AC
  Administered 2013-11-16: 100 ug via INTRAVENOUS

## 2013-11-16 MED ORDER — GLYCOPYRROLATE 0.2 MG/ML IJ SOLN
INTRAMUSCULAR | Status: AC
  Filled 2013-11-16: qty 1

## 2013-11-16 MED ORDER — LACTATED RINGERS IV SOLN
INTRAVENOUS | Status: DC
  Administered 2013-11-16: 50 mL/h via INTRAVENOUS

## 2013-11-16 MED ORDER — NEOSTIGMINE METHYLSULFATE 10 MG/10ML IV SOLN
INTRAVENOUS | Status: DC | PRN
Start: 2013-11-16 — End: 2013-11-16
  Administered 2013-11-16: 3 mg via INTRAVENOUS

## 2013-11-16 MED ORDER — SODIUM CHLORIDE 0.9 % IR SOLN
Status: DC | PRN
  Administered 2013-11-16: 1000 mL

## 2013-11-16 MED ORDER — PHENOL 1.4 % MT LIQD: 1.0000 | OROMUCOSAL | Status: DC | PRN

## 2013-11-16 MED ORDER — LIDOCAINE HCL (CARDIAC) 20 MG/ML IV SOLN
INTRAVENOUS | Status: AC
  Filled 2013-11-16: qty 5

## 2013-11-16 MED ORDER — CONJ ESTROG-MEDROXYPROGEST ACE 0.45-1.5 MG PO TABS: 1.0000 | ORAL_TABLET | Freq: Every day | ORAL | Status: DC

## 2013-11-16 MED ORDER — BUDESONIDE-FORMOTEROL FUMARATE 160-4.5 MCG/ACT IN AERO
2.0000 | INHALATION_SPRAY | Freq: Two times a day (BID) | RESPIRATORY_TRACT | Status: DC
  Administered 2013-11-17: 2 via RESPIRATORY_TRACT
  Filled 2013-11-16: qty 6

## 2013-11-16 MED ORDER — ONDANSETRON HCL 4 MG/2ML IJ SOLN
4.0000 mg | Freq: Four times a day (QID) | INTRAMUSCULAR | Status: DC | PRN
Start: 2013-11-16 — End: 2013-11-18
  Administered 2013-11-16 – 2013-11-17 (×2): 4 mg via INTRAVENOUS
  Filled 2013-11-16 (×2): qty 2

## 2013-11-16 MED ORDER — LACTATED RINGERS IV SOLN
INTRAVENOUS | Status: DC | PRN
  Administered 2013-11-16 (×2): via INTRAVENOUS

## 2013-11-16 MED ORDER — CELECOXIB 200 MG PO CAPS
200.0000 mg | ORAL_CAPSULE | Freq: Two times a day (BID) | ORAL | Status: DC
  Administered 2013-11-17 (×2): 200 mg via ORAL
  Filled 2013-11-16 (×5): qty 1

## 2013-11-16 MED ORDER — OXYCODONE HCL 5 MG PO TABS
5.0000 mg | ORAL_TABLET | ORAL | Status: DC | PRN
  Administered 2013-11-16 – 2013-11-17 (×4): 10 mg via ORAL
  Filled 2013-11-16 (×4): qty 2

## 2013-11-16 MED ORDER — HYDROMORPHONE HCL 1 MG/ML IJ SOLN
INTRAMUSCULAR | Status: AC
  Administered 2013-11-16: 14:00:00
  Filled 2013-11-16: qty 1

## 2013-11-16 MED ORDER — SODIUM CHLORIDE 0.9 % IV SOLN
INTRAVENOUS | Status: DC
Start: 2013-11-16 — End: 2013-11-16

## 2013-11-16 MED ORDER — ACETAMINOPHEN 10 MG/ML IV SOLN
INTRAVENOUS | Status: AC
  Filled 2013-11-16: qty 100

## 2013-11-16 MED ORDER — MENTHOL 3 MG MT LOZG: 1.0000 | LOZENGE | OROMUCOSAL | Status: DC | PRN

## 2013-11-16 MED ORDER — BUPIVACAINE LIPOSOME 1.3 % IJ SUSP
INTRAMUSCULAR | Status: DC | PRN
  Administered 2013-11-16: 20 mL

## 2013-11-16 MED ORDER — ACETAMINOPHEN 650 MG RE SUPP: 650.0000 mg | Freq: Four times a day (QID) | RECTAL | Status: DC | PRN

## 2013-11-16 MED ORDER — DOCUSATE SODIUM 100 MG PO CAPS
100.0000 mg | ORAL_CAPSULE | Freq: Two times a day (BID) | ORAL | Status: DC
  Administered 2013-11-17 – 2013-11-18 (×3): 100 mg via ORAL
  Filled 2013-11-16 (×4): qty 1

## 2013-11-16 MED ORDER — HYDROMORPHONE HCL 1 MG/ML IJ SOLN
0.2500 mg | INTRAMUSCULAR | Status: DC | PRN
  Administered 2013-11-16 (×4): 0.5 mg via INTRAVENOUS

## 2013-11-16 MED ORDER — MIDAZOLAM HCL 2 MG/2ML IJ SOLN
INTRAMUSCULAR | Status: AC
  Administered 2013-11-16: 2 mg
  Filled 2013-11-16: qty 2

## 2013-11-16 MED ORDER — METOCLOPRAMIDE HCL 5 MG/ML IJ SOLN: 5.0000 mg | Freq: Three times a day (TID) | INTRAMUSCULAR | Status: DC | PRN

## 2013-11-16 MED ORDER — METOCLOPRAMIDE HCL 10 MG PO TABS: 5.0000 mg | ORAL_TABLET | Freq: Three times a day (TID) | ORAL | Status: DC | PRN

## 2013-11-16 MED ORDER — HYDROMORPHONE HCL 1 MG/ML IJ SOLN
1.0000 mg | INTRAMUSCULAR | Status: DC | PRN
  Administered 2013-11-16 (×2): 1 mg via INTRAVENOUS
  Filled 2013-11-16 (×2): qty 1

## 2013-11-16 MED ORDER — PROMETHAZINE HCL 25 MG/ML IJ SOLN
INTRAMUSCULAR | Status: AC
  Administered 2013-11-16: 13:00:00
  Filled 2013-11-16: qty 1

## 2013-11-16 SURGICAL SUPPLY — 58 items
BANDAGE ESMARK 6X9 LF (GAUZE/BANDAGES/DRESSINGS) ×1 IMPLANT
BLADE SAGITTAL 13X1.27X60 (BLADE) ×2 IMPLANT
BLADE SAW SGTL 83.5X18.5 (BLADE) ×2 IMPLANT
BLADE SURG 10 STRL SS (BLADE) ×1 IMPLANT
BNDG CMPR 9X6 STRL LF SNTH (GAUZE/BANDAGES/DRESSINGS) ×1
BNDG ESMARK 6X9 LF (GAUZE/BANDAGES/DRESSINGS) ×2
BOWL SMART MIX CTS (DISPOSABLE) ×2 IMPLANT
CAP POR NKTM CP VIT E LN CER ×1 IMPLANT
CEMENT BONE SIMPLEX SPEEDSET (Cement) ×4 IMPLANT
COVER SURGICAL LIGHT HANDLE (MISCELLANEOUS) ×2 IMPLANT
CUFF TOURNIQUET SINGLE 34IN LL (TOURNIQUET CUFF) ×2 IMPLANT
DRAPE EXTREMITY T 121X128X90 (DRAPE) ×2 IMPLANT
DRAPE INCISE IOBAN 66X45 STRL (DRAPES) ×4 IMPLANT
DRAPE PROXIMA HALF (DRAPES) ×2 IMPLANT
DRAPE U-SHAPE 47X51 STRL (DRAPES) ×2 IMPLANT
DRSG ADAPTIC 3X8 NADH LF (GAUZE/BANDAGES/DRESSINGS) ×2 IMPLANT
DRSG PAD ABDOMINAL 8X10 ST (GAUZE/BANDAGES/DRESSINGS) ×2 IMPLANT
DURAPREP 26ML APPLICATOR (WOUND CARE) ×4 IMPLANT
ELECT REM PT RETURN 9FT ADLT (ELECTROSURGICAL) ×2
ELECTRODE REM PT RTRN 9FT ADLT (ELECTROSURGICAL) ×1 IMPLANT
EVACUATOR 1/8 PVC DRAIN (DRAIN) ×2 IMPLANT
GAUZE SPONGE 4X4 12PLY STRL (GAUZE/BANDAGES/DRESSINGS) ×2 IMPLANT
GLOVE BIOGEL M 7.0 STRL (GLOVE) IMPLANT
GLOVE BIOGEL PI IND STRL 7.5 (GLOVE) IMPLANT
GLOVE BIOGEL PI IND STRL 8.5 (GLOVE) ×2 IMPLANT
GLOVE BIOGEL PI INDICATOR 7.5 (GLOVE)
GLOVE BIOGEL PI INDICATOR 8.5 (GLOVE) ×2
GLOVE SURG ORTHO 8.0 STRL STRW (GLOVE) ×4 IMPLANT
GOWN STRL REUS W/ TWL LRG LVL3 (GOWN DISPOSABLE) ×2 IMPLANT
GOWN STRL REUS W/ TWL XL LVL3 (GOWN DISPOSABLE) ×2 IMPLANT
GOWN STRL REUS W/TWL LRG LVL3 (GOWN DISPOSABLE) ×2
GOWN STRL REUS W/TWL XL LVL3 (GOWN DISPOSABLE) ×6
HANDPIECE INTERPULSE COAX TIP (DISPOSABLE) ×2
HOOD PEEL AWAY FACE SHEILD DIS (HOOD) ×8 IMPLANT
KIT BASIN OR (CUSTOM PROCEDURE TRAY) ×2 IMPLANT
KIT ROOM TURNOVER OR (KITS) ×2 IMPLANT
MANIFOLD NEPTUNE II (INSTRUMENTS) ×2 IMPLANT
NEEDLE 22X1 1/2 (OR ONLY) (NEEDLE) ×4 IMPLANT
NS IRRIG 1000ML POUR BTL (IV SOLUTION) ×2 IMPLANT
PACK TOTAL JOINT (CUSTOM PROCEDURE TRAY) ×2 IMPLANT
PAD ARMBOARD 7.5X6 YLW CONV (MISCELLANEOUS) ×4 IMPLANT
PADDING CAST COTTON 6X4 STRL (CAST SUPPLIES) ×2 IMPLANT
SET HNDPC FAN SPRY TIP SCT (DISPOSABLE) ×1 IMPLANT
SPONGE GAUZE 4X4 12PLY STER LF (GAUZE/BANDAGES/DRESSINGS) ×1 IMPLANT
STAPLER VISISTAT 35W (STAPLE) ×2 IMPLANT
SUCTION FRAZIER TIP 10 FR DISP (SUCTIONS) ×2 IMPLANT
SUT BONE WAX W31G (SUTURE) ×2 IMPLANT
SUT VIC AB 0 CTB1 27 (SUTURE) ×4 IMPLANT
SUT VIC AB 1 CT1 27 (SUTURE) ×4
SUT VIC AB 1 CT1 27XBRD ANBCTR (SUTURE) ×2 IMPLANT
SUT VIC AB 2-0 CT1 27 (SUTURE) ×4
SUT VIC AB 2-0 CT1 TAPERPNT 27 (SUTURE) ×2 IMPLANT
SYR 20CC LL (SYRINGE) ×2 IMPLANT
SYR CONTROL 10ML LL (SYRINGE) ×2 IMPLANT
TOWEL OR 17X24 6PK STRL BLUE (TOWEL DISPOSABLE) ×2 IMPLANT
TOWEL OR 17X26 10 PK STRL BLUE (TOWEL DISPOSABLE) ×2 IMPLANT
TRAY FOLEY CATH 14FR (SET/KITS/TRAYS/PACK) ×2 IMPLANT
WATER STERILE IRR 1000ML POUR (IV SOLUTION) ×4 IMPLANT

## 2013-11-16 NOTE — Transfer of Care (Signed)
Immediate Anesthesia Transfer of Care Note  Patient: Brandy Dunlap  Procedure(s) Performed: Procedure(s): RIGHT TOTAL KNEE ARTHROPLASTY (Right)  Patient Location: PACU  Anesthesia Type:General  Level of Consciousness: awake, alert  and oriented  Airway & Oxygen Therapy: Patient Spontanous Breathing and Patient connected to nasal cannula oxygen  Post-op Assessment: Report given to PACU RN and Post -op Vital signs reviewed and stable  Post vital signs: Reviewed and stable  Complications: No apparent anesthesia complications

## 2013-11-16 NOTE — Anesthesia Procedure Notes (Addendum)
Anesthesia Regional Block:  Femoral nerve block  Pre-Anesthetic Checklist: ,, timeout performed, Correct Patient, Correct Site, Correct Laterality, Correct Procedure, Correct Position, site marked, Risks and benefits discussed,  Surgical consent,  Pre-op evaluation,  At surgeon's request and post-op pain management  Laterality: Right  Prep: chloraprep       Needles:  Injection technique: Single-shot  Needle Type: Echogenic Stimulator Needle     Needle Length: 9cm 9 cm Needle Gauge: 21 and 21 G    Additional Needles:  Procedures: ultrasound guided (picture in chart) Femoral nerve block  Nerve Stimulator or Paresthesia:  Response: 0.5 mA,   Additional Responses:   Narrative:  Injection made incrementally with aspirations every 5 mL.  Performed by: Personally  Anesthesiologist: Myrtie Soman MD  Additional Notes: Patient tolerated the procedure well without complications   Procedure Name: Intubation Date/Time: 11/16/2013 10:44 AM Performed by: Susa Loffler Pre-anesthesia Checklist: Patient identified, Timeout performed, Emergency Drugs available, Suction available and Patient being monitored Patient Re-evaluated:Patient Re-evaluated prior to inductionOxygen Delivery Method: Circle system utilized Preoxygenation: Pre-oxygenation with 100% oxygen Intubation Type: IV induction Ventilation: Mask ventilation without difficulty Laryngoscope Size: Mac and 3 Grade View: Grade II Tube type: Oral Tube size: 7.0 mm Number of attempts: 1 Airway Equipment and Method: Stylet Placement Confirmation: ETT inserted through vocal cords under direct vision,  positive ETCO2 and breath sounds checked- equal and bilateral Secured at: 21 cm Tube secured with: Tape Dental Injury: Teeth and Oropharynx as per pre-operative assessment

## 2013-11-16 NOTE — H&P (Signed)
Brandy Dunlap MRN:  [id number] DOB/SEX:  [birth year]/female  CHIEF COMPLAINT:  Painful right Knee  HISTORY: Patient is a 63 y.o. female presented with a history of pain in the right knee. Onset of symptoms was gradual starting several years ago with gradually worsening course since that time. Prior procedures on the knee include none. Patient has been treated conservatively with over-the-counter NSAIDs and activity modification. Patient currently rates pain in the knee at 9 out of 10 with activity. There is pain at night.  PAST MEDICAL HISTORY: Patient Active Problem List   Diagnosis Date Noted  . CAP (community acquired pneumonia) 01/24/2013  . AKI (acute kidney injury) 01/24/2013  . Sepsis 01/24/2013  . 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 Cough 06/26/2012  . B12 DEFICIENCY 09/21/2009  . ALLERGIC RHINITIS 06/29/2009  . SYMPTOMATIC MENOPAUSAL/FEMALE CLIMACTERIC STATES 09/06/2008  . HYPERTENSION 10/08/2007  . OSTEOARTHRITIS 10/08/2007  . GERD 07/24/2007  . LOW BACK PAIN 07/24/2007  . EDEMA 07/24/2007   Past Medical History  Diagnosis Date  . Low back pain   . GERD (gastroesophageal reflux disease)   . Hypertension   . Osteoarthritis   . Sinus trouble   . Colon polyp 10/06/2003  . B12 deficiency   . Bronchitis     hx   Past Surgical History  Procedure Laterality Date  . Lumbar fusion  2008  . Cholecystectomy    . Tonsillectomy    . Partial hip arthroplasty Right 2009     hip replacement  . Colonoscopy w/ biopsies  10/06/2003  . Video bronchoscopy Bilateral 12/05/2012    Procedure: VIDEO BRONCHOSCOPY WITHOUT FLUORO;  Surgeon: Collene Gobble, MD;  Location: [facility];  Service: Cardiopulmonary;  Laterality: Bilateral;  . Back surgery       MEDICATIONS:   No prescriptions prior to admission    ALLERGIES:   Allergies  Allergen Reactions  . Pantoprazole Sodium Other (See Comments)    Racing heart    REVIEW OF SYSTEMS:  Pertinent items are noted in HPI.   FAMILY  HISTORY:   Family History  Problem Relation Age of Onset  . COPD Father     died at age 39  . Emphysema Father   . Heart disease Brother     stent with 90%  . COPD Mother   . Heart disease Mother   . Ovarian cancer Mother   . Emphysema Mother     SOCIAL HISTORY:   History  Substance Use Topics  . Smoking status: Never Smoker   . Smokeless tobacco: Never Used  . Alcohol Use: No     EXAMINATION:  Vital signs in last 24 hours:    General appearance: alert, cooperative and no distress Lungs: clear to auscultation bilaterally Heart: regular rate and rhythm, S1, S2 normal, no murmur, click, rub or gallop Abdomen: soft, non-tender; bowel sounds normal; no masses,  no organomegaly Extremities: extremities normal, atraumatic, no cyanosis or edema and Homans sign is negative, no sign of DVT Pulses: 2+ and symmetric Skin: Skin color, texture, turgor normal. No rashes or lesions Neurologic: Alert and oriented X 3, normal strength and tone. Normal symmetric reflexes. Normal coordination and gait  Musculoskeletal:  ROM 0-115, Ligaments intact,  Imaging Review Plain radiographs demonstrate severe degenerative joint disease of the right knee. The overall alignment is significant varus. The bone quality appears to be good for age and reported activity level.  Assessment/Plan: End stage arthritis, right knee   The patient history, physical examination and imaging studies are  consistent with advanced degenerative joint disease of the right knee. The patient has failed conservative treatment.  The clearance notes were reviewed.  After discussion with the patient it was felt that Total Knee Replacement was indicated. The procedure,  risks, and benefits of total knee arthroplasty were presented and reviewed. The risks including but not limited to aseptic loosening, infection, blood clots, vascular injury, stiffness, patella tracking problems complications among others were discussed. The patient  acknowledged the explanation, agreed to proceed with the plan.  Brandy Dunlap 11/16/2013, 6:41 AM

## 2013-11-16 NOTE — Anesthesia Preprocedure Evaluation (Signed)
Anesthesia Evaluation  Patient identified by MRN, date of birth, ID band Patient awake    Reviewed: Allergy & Precautions, H&P , NPO status , Patient's Chart, lab work & pertinent test results  Airway Mallampati: III TM Distance: <3 FB Neck ROM: Full    Dental no notable dental hx.    Pulmonary neg pulmonary ROS,  breath sounds clear to auscultation  Pulmonary exam normal       Cardiovascular hypertension, Pt. on medications Rhythm:Regular Rate:Normal     Neuro/Psych negative neurological ROS  negative psych ROS   GI/Hepatic negative GI ROS, Neg liver ROS,   Endo/Other  Morbid obesity  Renal/GU negative Renal ROS  negative genitourinary   Musculoskeletal negative musculoskeletal ROS (+)   Abdominal   Peds negative pediatric ROS (+)  Hematology negative hematology ROS (+)   Anesthesia Other Findings   Reproductive/Obstetrics negative OB ROS                           Anesthesia Physical Anesthesia Plan  ASA: II  Anesthesia Plan: General   Post-op Pain Management:    Induction: Intravenous  Airway Management Planned: Oral ETT  Additional Equipment:   Intra-op Plan:   Post-operative Plan: Extubation in OR  Informed Consent: I have reviewed the patients History and Physical, chart, labs and discussed the procedure including the risks, benefits and alternatives for the proposed anesthesia with the patient or authorized representative who has indicated his/her understanding and acceptance.   Dental advisory given  Plan Discussed with: CRNA and Surgeon  Anesthesia Plan Comments:         Anesthesia Quick Evaluation

## 2013-11-16 NOTE — Anesthesia Postprocedure Evaluation (Signed)
  Anesthesia Post-op Note  Patient: Brandy Dunlap  Procedure(s) Performed: Procedure(s) (LRB): RIGHT TOTAL KNEE ARTHROPLASTY (Right)  Patient Location: PACU  Anesthesia Type: GA combined with regional for post-op pain  Level of Consciousness: awake and alert   Airway and Oxygen Therapy: Patient Spontanous Breathing  Post-op Pain: mild  Post-op Assessment: Post-op Vital signs reviewed, Patient's Cardiovascular Status Stable, Respiratory Function Stable, Patent Airway and No signs of Nausea or vomiting  Last Vitals:  Filed Vitals:   11/16/13 1230  BP:   Pulse:   Temp: 36.4 C  Resp:     Post-op Vital Signs: stable   Complications: No apparent anesthesia complications

## 2013-11-16 NOTE — Progress Notes (Signed)
Orthopedic Tech Progress Note Patient Details:  Brandy Dunlap 06/03/1950 [id number] CPM applied to RLE with appropriate settings. OHF applied to bed. Footsie roll provided. CPM Right Knee CPM Right Knee: On Right Knee Flexion (Degrees): 90 Right Knee Extension (Degrees): 0   Asia R Thompson 11/16/2013, 3:57 PM

## 2013-11-17 ENCOUNTER — Encounter (HOSPITAL_COMMUNITY): Payer: Self-pay | Admitting: Orthopedic Surgery

## 2013-11-17 LAB — CBC
HCT: 34.4 % — ABNORMAL LOW (ref 36.0–46.0)
Hemoglobin: 11.2 g/dL — ABNORMAL LOW (ref 12.0–15.0)
MCH: 28.9 pg (ref 26.0–34.0)
MCHC: 32.6 g/dL (ref 30.0–36.0)
MCV: 88.9 fL (ref 78.0–100.0)
Platelets: 245 10*3/uL (ref 150–400)
RBC: 3.87 MIL/uL (ref 3.87–5.11)
RDW: 14.5 % (ref 11.5–15.5)
WBC: 8.8 10*3/uL (ref 4.0–10.5)

## 2013-11-17 LAB — BASIC METABOLIC PANEL
Anion gap: 11 (ref 5–15)
BUN: 10 mg/dL (ref 6–23)
CO2: 26 mEq/L (ref 19–32)
Calcium: 8.6 mg/dL (ref 8.4–10.5)
Chloride: 99 mEq/L (ref 96–112)
Creatinine, Ser: 0.78 mg/dL (ref 0.50–1.10)
GFR calc Af Amer: >90 mL/min (ref >90)
GFR calc non Af Amer: 87 mL/min — ABNORMAL LOW (ref >90)
Glucose, Bld: 120 mg/dL — ABNORMAL HIGH (ref 70–99)
Potassium: 4.3 mEq/L (ref 3.7–5.3)
Sodium: 136 mEq/L — ABNORMAL LOW (ref 137–147)

## 2013-11-17 MED ORDER — OXYCODONE HCL ER 20 MG PO T12A: 20.0000 mg | EXTENDED_RELEASE_TABLET | Freq: Two times a day (BID) | ORAL | Status: DC

## 2013-11-17 MED ORDER — ONDANSETRON HCL 4 MG PO TABS: 4.0000 mg | ORAL_TABLET | Freq: Four times a day (QID) | ORAL | Status: DC | PRN

## 2013-11-17 MED ORDER — ENOXAPARIN SODIUM 40 MG/0.4ML ~~LOC~~ SOLN: 40.0000 mg | SUBCUTANEOUS | Status: DC

## 2013-11-17 MED ORDER — METHOCARBAMOL 500 MG PO TABS: 500.0000 mg | ORAL_TABLET | Freq: Four times a day (QID) | ORAL | Status: DC | PRN

## 2013-11-17 MED ORDER — OXYCODONE HCL 5 MG PO TABS
5.0000 mg | ORAL_TABLET | ORAL | Status: DC | PRN
  Administered 2013-11-17: 10 mg via ORAL
  Administered 2013-11-17 (×2): 15 mg via ORAL
  Filled 2013-11-17 (×3): qty 3

## 2013-11-17 MED ORDER — OXYCODONE HCL 5 MG PO TABS: 5.0000 mg | ORAL_TABLET | ORAL | Status: DC | PRN

## 2013-11-17 MED ORDER — OXYCODONE HCL ER 20 MG PO T12A
20.0000 mg | EXTENDED_RELEASE_TABLET | Freq: Two times a day (BID) | ORAL | Status: DC
  Administered 2013-11-17 – 2013-11-18 (×2): 20 mg via ORAL
  Filled 2013-11-17 (×2): qty 1

## 2013-11-17 NOTE — Progress Notes (Signed)
Utilization review completed.  

## 2013-11-17 NOTE — Evaluation (Signed)
Occupational Therapy Evaluation Patient Details Name: Brandy Dunlap MRN: [id number] DOB: 1950/07/22 Today's Date: 11/17/2013    History of Present Illness s/p R TKA   Clinical Impression   Pt admitted with the above diagnoses and presents with below problem list. Pt will benefit from continued acute OT to address the below listed deficits and maximize independence with basic ADLs prior to d/c home with family. PTA pt was independent with ADLs. Pt currently at min A for LB ADLs. Pain is a limiting factor this date.     Follow Up Recommendations  Supervision/Assistance - 24 hour;No OT follow up    Equipment Recommendations  3 in 1 bedside comode    Recommendations for Other Services       Precautions / Restrictions Precautions Precautions: Knee Precaution Booklet Issued: No Precaution Comments: reviewed precautions Restrictions Weight Bearing Restrictions: Yes RLE Weight Bearing: Weight bearing as tolerated      Mobility Bed Mobility Overal bed mobility: Needs Assistance Bed Mobility: Supine to Sit;Sit to Supine     Supine to sit: Min guard;HOB elevated Sit to supine: Min guard   General bed mobility comments: Pt able to power up leg across bed and to/from floor. Guarding of RLE at heel as it lowered/raised to/from floor provided by therapist but no physical assistance.  Transfers                 General transfer comment: Pt declined OOB this session due to pain.    Balance                                            ADL Overall ADL's : Needs assistance/impaired Eating/Feeding: Set up;Sitting   Grooming: Set up;Sitting   Upper Body Bathing: Set up;Sitting   Lower Body Bathing: Minimal assistance;With adaptive equipment;Sit to/from stand   Upper Body Dressing : Set up;Sitting   Lower Body Dressing: Minimal assistance;With adaptive equipment;Sit to/from stand   Toilet Transfer: Minimal assistance;Stand-pivot;BSC;RW Armed forces technical officer  Details (indicate cue type and reason): Per pt report stand pivot transfer to [facility] early this morning with assistance from [place] and Hygiene: Minimal assistance;Sit to/from stand   Tub/ Shower Transfer: Minimal assistance;Stand-pivot;3 in Optometrist ADL Comments: Educated pt and daughter on techniques and AE for safe completion of LB ADLs. Discussed home setup for bed mobility, toilet and shower transfers. Pt currently at min A level for OOB activities due to pain.      Vision                     Perception     Praxis      Pertinent Vitals/Pain Pain Assessment: 0-10 Pain Score: 8  Pain Location: R knee Pain Descriptors / Indicators: Constant;Aching Pain Intervention(s): Limited activity within patient's tolerance;Monitored during session;RN gave pain meds during session;Repositioned     Hand Dominance     Extremity/Trunk Assessment Upper Extremity Assessment Upper Extremity Assessment: Overall WFL for tasks assessed   Lower Extremity Assessment Lower Extremity Assessment: Defer to PT evaluation       Communication Communication Communication: No difficulties   Cognition Arousal/Alertness: Awake/alert Behavior During Therapy: WFL for tasks assessed/performed Overall Cognitive Status: Within Functional Limits for tasks assessed                     General  Comments       Exercises       Shoulder Instructions      Home Living Family/patient expects to be discharged to:: Private residence Living Arrangements: Other relatives;Children Available Help at Discharge: Family;Available 24 hours/day Type of Home: House Home Access: Stairs to enter [employer] of Steps: 1 Entrance Stairs-Rails: None (walls on both sides she can reach) Home Layout: One level     Bathroom Shower/Tub: Teacher, early years/pre: Handicapped height Bathroom Accessibility: Yes How Accessible:  Accessible via walker;Other (comment) (may need to sidestep) Home Equipment: Walker - 2 wheels;Grab bars - toilet;Grab bars - tub/shower;Hand held shower head;Adaptive equipment Adaptive Equipment: Reacher        Prior Functioning/Environment Level of Independence: Independent             OT Diagnosis: Acute pain   OT Problem List: Decreased activity tolerance;Impaired balance (sitting and/or standing);Decreased knowledge of use of DME or AE;Decreased knowledge of precautions;Pain   OT Treatment/Interventions: Self-care/ADL training;Therapeutic exercise;DME and/or AE instruction;Therapeutic activities;Patient/family education;Balance training    OT Goals(Current goals can be found in the care plan section) Acute Rehab OT Goals Patient Stated Goal: not stated OT Goal Formulation: With patient/family Time For Goal Achievement: 11/24/13 Potential to Achieve Goals: Good ADL Goals Pt Will Perform Lower Body Bathing: with supervision;with adaptive equipment;sit to/from stand Pt Will Perform Lower Body Dressing: with supervision;with adaptive equipment;sit to/from stand Pt Will Transfer to Toilet: with supervision;ambulating (3n1 over toilet) Pt Will Perform Toileting - Clothing Manipulation and hygiene: with supervision;sit to/from stand Pt Will Perform Tub/Shower Transfer: with supervision;ambulating;3 in 1;rolling walker Additional ADL Goal #1: Pt will perform sit<>supine at mod I level to prepare for OOB ADLs.  OT Frequency: Min 2X/week   Barriers to D/C:            Co-evaluation              End of Session CPM Right Knee CPM Right Knee: Off  Activity Tolerance: Patient limited by pain;Patient tolerated treatment well Patient left: in bed;with call bell/phone within reach;with family/visitor present   Time: 8546-2703 OT Time Calculation (min): 21 min Charges:  OT General Charges $OT Visit: 1 Procedure OT Evaluation $Initial OT Evaluation Tier I: 1 Procedure OT  Treatments $Self Care/Home Management : 8-22 mins G-Codes:    Hortencia Pilar 2013-12-03, 9:34 AM

## 2013-11-17 NOTE — Evaluation (Signed)
Physical Therapy Evaluation Patient Details Name: Brandy Dunlap MRN: [id number] DOB: 10-11-1950 Today's Date: 11/17/2013   History of Present Illness  63 y.o. female s/p right total knee arthroplasty. Hx of HTN, GERD, B12 deficiency, and acute kidney injury.  Clinical Impression  Pt is s/p right TKA, presenting with the deficits listed below (see PT Problem List). Pt ambulating with min guard for majority of distance covered (80 feet) this AM however, had one instance of posterior loss of balance requiring min assist to correct. Reviewed knee precautions, use of footsie roll, and therapeutic exercises. Pt will benefit from skilled PT to increase their independence and safety with mobility to allow discharge to the venue listed below.     Follow Up Recommendations Home health PT;Supervision for mobility/OOB    Equipment Recommendations  3in1 (PT)    Recommendations for Other Services OT consult     Precautions / Restrictions Precautions Precautions: Knee Precaution Booklet Issued: Yes (comment) Precaution Comments: Reviewed knee precautions Restrictions Weight Bearing Restrictions: Yes RLE Weight Bearing: Weight bearing as tolerated      Mobility  Bed Mobility Overal bed mobility: Needs Assistance Bed Mobility: Supine to Sit     Supine to sit: Min assist Sit to supine: Min guard   General bed mobility comments: Min assist for RLE support. VC for technique. Educated to use LLE to support RLE out of bed. Requires extra time.  Transfers Overall transfer level: Needs assistance Equipment used: Rolling walker (2 wheeled) Transfers: Sit to/from Stand Sit to Stand: Min guard         General transfer comment: Min guard for safety. VC for hand placement on stable surface for boost to rise. Encouraged to incrase WB through RLE for assist.  Ambulation/Gait Ambulation/Gait assistance: Min assist Ambulation Distance (Feet): 80 Feet Assistive device: Rolling walker (2  wheeled) Gait Pattern/deviations: Step-to pattern;Decreased step length - left;Decreased stance time - right;Antalgic   Gait velocity interpretation: Below normal speed for age/gender General Gait Details: Educated on safe DME use. One instance of loss of balance to posterior due to Rt knee buckling required min assist to correct. Min guard for majority of ambulatory distance. VC for Rt knee extension in stance phase for quad activation.  Stairs            Wheelchair Mobility    Modified Rankin (Stroke Patients Only)       Balance Overall balance assessment: Needs assistance Sitting-balance support: Feet supported;No upper extremity supported Sitting balance-Leahy Scale: Good     Standing balance support: Bilateral upper extremity supported Standing balance-Leahy Scale: Poor                               Pertinent Vitals/Pain Pain Assessment: 0-10 Pain Score: 7  Pain Location: Rt knee Pain Descriptors / Indicators: Constant;Aching Pain Intervention(s): Limited activity within patient's tolerance;Monitored during session;Repositioned    Home Living Family/patient expects to be discharged to:: Private residence Living Arrangements: Other relatives;Children Available Help at Discharge: Family;Available 24 hours/day Type of Home: House Home Access: Stairs to enter Entrance Stairs-Rails: None Entrance Stairs-Number of Steps: 1 Home Layout: One level Home Equipment: Walker - 2 wheels;Grab bars - toilet;Grab bars - tub/shower;Hand held shower head;Adaptive equipment      Prior Function Level of Independence: Independent               Hand Dominance   Dominant Hand: Right    Extremity/Trunk Assessment   Upper  Extremity Assessment: Defer to OT evaluation           Lower Extremity Assessment: RLE deficits/detail RLE Deficits / Details: decreased strength and ROM as expected post op       Communication   Communication: No difficulties   Cognition Arousal/Alertness: Awake/alert Behavior During Therapy: WFL for tasks assessed/performed Overall Cognitive Status: Within Functional Limits for tasks assessed                      General Comments General comments (skin integrity, edema, etc.): Pt reports pain medication does not work    Exercises Total Joint Exercises Ankle Circles/Pumps: AROM;Both;10 reps;Supine Quad Sets: AROM;Right;10 reps;Supine      Assessment/Plan    PT Assessment Patient needs continued PT services  PT Diagnosis Difficulty walking;Abnormality of gait;Acute pain   PT Problem List Decreased strength;Decreased range of motion;Decreased activity tolerance;Decreased balance;Decreased mobility;Decreased knowledge of use of DME;Decreased knowledge of precautions;Pain  PT Treatment Interventions DME instruction;Gait training;Functional mobility training;Therapeutic activities;Therapeutic exercise;Balance training;Neuromuscular re-education;Patient/family education;Modalities   PT Goals (Current goals can be found in the Care Plan section) Acute Rehab PT Goals Patient Stated Goal: Go home PT Goal Formulation: With patient Time For Goal Achievement: 11/24/13 Potential to Achieve Goals: Good    Frequency 7X/week   Barriers to discharge        Co-evaluation               End of Session   Activity Tolerance: Patient tolerated treatment well Patient left: in chair;with call bell/phone within reach;with family/visitor present           Time: 1016-1050 PT Time Calculation (min): 34 min   Charges:   PT Evaluation $Initial PT Evaluation Tier I: 1 Procedure PT Treatments $Gait Training: 8-22 mins $Therapeutic Activity: 8-22 mins   PT G Codes:        Elayne Snare, [place]   Ellouise Newer 11/17/2013, 11:41 AM

## 2013-11-17 NOTE — Discharge Instructions (Signed)
Diet: As you were doing prior to hospitalization   Activity:  Increase activity slowly as tolerated                  No lifting or driving for 6 weeks  Shower:  May shower without a dressing once there is no drainage from your wound.                 Do NOT wash over the wound.                 Dressing:  You may change your dressing on Thursday                    Then change the dressing daily with sterile 4"x4"s gauze dressing                     And TED hose for knees.  Weight Bearing:  Weight bearing as tolerated as taught in physical therapy.  Use a                                walker or Crutches as instructed.  To prevent constipation: you may use a stool softener such as -               Colace ( over the counter) 100 mg by mouth twice a day                Drink plenty of fluids ( prune juice may be helpful) and high fiber foods                Miralax ( over the counter) for constipation as needed.    Precautions:  If you experience chest pain or shortness of breath - call 911 immediately               For transfer to the hospital emergency department!!               If you develop a fever greater that 101 F, purulent drainage from wound,                             increased redness or drainage from wound, or calf pain -- Call the office.  Follow- Up Appointment:  Please call for an appointment to be seen on 12/01/13                                              [facility] office:  [phone]            [address] [place], [place] [address]

## 2013-11-17 NOTE — Progress Notes (Signed)
Physical Therapy Treatment Patient Details Name: Brandy Dunlap MRN: [id number] DOB: Jul 05, 1950 Today's Date: 11/17/2013    History of Present Illness 63 y.o. female s/p right total knee arthroplasty. Hx of HTN, GERD, B12 deficiency, and acute kidney injury.    PT Comments    Patient making good progress with mobility. Patient feels good with the way she is moving however has concerns about discharging home due to feeling like she is in a "fog" and still "out of it" with the meds. Patient safe to D/C from a mobility standpoint based on progression towards goals set on PT eval. And patient will have 24/7 care at home.    Follow Up Recommendations  Home health PT;Supervision for mobility/OOB     Equipment Recommendations  3in1 (PT)    Recommendations for Other Services       Precautions / Restrictions Precautions Precautions: Knee Precaution Comments: Reviewed knee precautions Restrictions RLE Weight Bearing: Weight bearing as tolerated    Mobility  Bed Mobility               General bed mobility comments: Patient up in recliner before and after session  Transfers Overall transfer level: Needs assistance Equipment used: Rolling walker (2 wheeled)   Sit to Stand: Supervision         General transfer comment: Cues for safe hand placement  Ambulation/Gait Ambulation/Gait assistance: Min guard Ambulation Distance (Feet): 180 Feet Assistive device: Rolling walker (2 wheeled) Gait Pattern/deviations: Step-to pattern   Gait velocity interpretation: Below normal speed for age/gender General Gait Details: attempted step through pattern but unable at this time. no LOB this session. Progressing well   Stairs            Wheelchair Mobility    Modified Rankin (Stroke Patients Only)       Balance                                    Cognition Arousal/Alertness: Awake/alert Behavior During Therapy: WFL for tasks assessed/performed Overall  Cognitive Status: Within Functional Limits for tasks assessed                      Exercises Total Joint Exercises Quad Sets: AROM;Right;10 reps;Supine Heel Slides: AAROM;Right;10 reps Hip ABduction/ADduction: AAROM;Right;10 reps Straight Leg Raises: AAROM;Right;10 reps    General Comments        Pertinent Vitals/Pain Pain Score: 5  Pain Location: Rt knee Pain Descriptors / Indicators: Sore;Burning Pain Intervention(s): Monitored during session    Home Living                      Prior Function            PT Goals (current goals can now be found in the care plan section) Progress towards PT goals: Progressing toward goals    Frequency  7X/week    PT Plan Current plan remains appropriate    Co-evaluation             End of Session Equipment Utilized During Treatment: Gait belt Activity Tolerance: Patient tolerated treatment well Patient left: in chair;with call bell/phone within reach     Time: 1401-1444 PT Time Calculation (min): 43 min  Charges:  $Gait Training: 23-37 mins $Therapeutic Exercise: 8-22 mins                    G Codes:  Jacqualyn Posey 11/17/2013, 3:20 PM 11/17/2013 Jacqualyn Posey PTA [phone] pager [phone] office

## 2013-11-17 NOTE — Progress Notes (Signed)
SPORTS MEDICINE AND JOINT REPLACEMENT  Brandy Mulch, MD   Carlynn Spry, PA-C [place], [place], [place]  [address]                             [phone]   PROGRESS NOTE  Subjective:  negative for Chest Pain  negative for Shortness of Breath  positive for Nausea/Vomiting   negative for Calf Pain  negative for Bowel Movement   Tolerating Diet: yes         Patient reports pain as 8 on 0-10 scale.    Objective: Vital signs in last 24 hours:   Patient Vitals for the past 24 hrs:  BP Temp Temp src Pulse Resp SpO2 Height Weight  11/17/13 0733 138/70 mmHg 98.6 F (37 C) Oral 83 - 95 % - -  11/17/13 0512 121/64 mmHg 97.9 F (36.6 C) Oral 88 - 95 % - -  11/17/13 0302 - - - - 16 98 % - -  11/17/13 0054 132/58 mmHg 98.4 F (36.9 C) Oral 97 - 96 % - -  11/17/13 0000 - - - - 16 95 % 5\' 2"  (1.575 m) 92.534 kg (204 lb)  11/16/13 2136 141/68 mmHg 98.5 F (36.9 C) Oral 76 - 94 % - -  11/16/13 1953 - - - - 14 98 % - -  11/16/13 1750 122/53 mmHg 97.8 F (36.6 C) - 58 16 97 % - -  11/16/13 1530 124/69 mmHg 97.7 F (36.5 C) - 66 16 95 % - -  11/16/13 1400 - 98 F (36.7 C) - 54 10 96 % - -  11/16/13 1359 130/57 mmHg - - 54 10 96 % - -  11/16/13 1345 - - - 52 11 96 % - -  11/16/13 1344 129/66 mmHg - - 56 11 96 % - -  11/16/13 1330 - - - 59 12 96 % - -  11/16/13 1329 120/63 mmHg - - 57 11 96 % - -  11/16/13 1315 - - - 51 13 98 % - -  11/16/13 1314 131/62 mmHg - - 55 11 96 % - -  11/16/13 1300 - - - 70 15 94 % - -  11/16/13 1259 130/58 mmHg - - 60 13 98 % - -  11/16/13 1245 - - - 70 15 97 % - -  11/16/13 1244 138/64 mmHg - - 62 18 98 % - -  11/16/13 1230 - 97.5 F (36.4 C) - 85 15 98 % - -  11/16/13 1229 143/60 mmHg - - 88 13 99 % - -  11/16/13 1015 136/60 mmHg - - 73 13 98 % - -  11/16/13 1010 120/56 mmHg - - 85 13 97 % - -  11/16/13 1005 138/64 mmHg - - 79 13 94 % - -  11/16/13 1000 129/63 mmHg - - 80 10 98 % - -  11/16/13 0955 132/60 mmHg - - 82 10 95 % - -   11/16/13 0950 144/60 mmHg - - 85 12 99 % - -  11/16/13 0945 132/67 mmHg - - 77 16 97 % - -  11/16/13 0940 136/57 mmHg - - 78 13 98 % - -  11/16/13 0935 164/96 mmHg - - 83 20 98 % - -  11/16/13 0930 150/60 mmHg - - 86 17 98 % - -  11/16/13 0815 129/75 mmHg 97.6 F (36.4 C) Oral 89 20 97 % - 92.534  kg (204 lb)    @flow {1959:LAST@   Intake/Output from previous day:   09/21 0701 - 09/22 0700 In: 2378.8 [P.O.:480; I.V.:1798.8] Out: 355 [Drains:355]   Intake/Output this shift:       Intake/Output     09/21 0701 - 09/22 0700 09/22 0701 - 09/23 0700   P.O. 480    I.V. (mL/kg) 1798.8 (19.4)    IV Piggyback 100    Total Intake(mL/kg) 2378.8 (25.7)    Drains 355    Total Output 355     Net +2023.8             LABORATORY DATA:  Recent Labs  11/16/13 1936 11/17/13 0546  WBC 11.7* 8.8  HGB 11.4* 11.2*  HCT 34.5* 34.4*  PLT 281 245    Recent Labs  11/16/13 1936 11/17/13 0546  NA  --  136*  K  --  4.3  CL  --  99  CO2  --  26  BUN  --  10  CREATININE 0.69 0.78  GLUCOSE  --  120*  CALCIUM  --  8.6   Lab Results  Component Value Date   INR 0.99 11/05/2013    Examination:    Wound Exam: clean, dry, intact   Drainage:  Scant/small amount Serosanguinous exudate  Motor Exam: EHL and FHL Intact  Sensory Exam: Deep Peroneal normal   Assessment:    1 Day Post-Op  Procedure(s) (LRB): RIGHT TOTAL KNEE ARTHROPLASTY (Right)  ADDITIONAL DIAGNOSIS:  Active Problems:   S/P total knee arthroplasty  Acute Blood Loss Anemia   Plan: Physical Therapy as ordered Weight Bearing as Tolerated (WBAT)  DVT Prophylaxis:  Lovenox  DISCHARGE PLAN: Home  DISCHARGE NEEDS: HHPT, CPM, Walker and 3-in-1 comode seat         Brandy Dunlap 11/17/2013, 7:40 AM

## 2013-11-17 NOTE — Op Note (Signed)
TOTAL KNEE REPLACEMENT OPERATIVE NOTE:  11/16/2013  10:47 AM  PATIENT:  Brandy Dunlap  63 y.o. female  PRE-OPERATIVE DIAGNOSIS:  ostoearthritis right knee  POST-OPERATIVE DIAGNOSIS:  OA right knee  PROCEDURE:  Procedure(s): RIGHT TOTAL KNEE ARTHROPLASTY  SURGEON:  Surgeon(s): Vickey Huger, MD  PHYSICIAN ASSISTANT: Carlynn Spry, [facility]  ANESTHESIA:   general  DRAINS: Hemovac  SPECIMEN: None  COUNTS:  Correct  TOURNIQUET:   Total Tourniquet Time Documented: Thigh (Right) - 41 minutes Total: Thigh (Right) - 41 minutes   DICTATION:  Indication for procedure:    The patient is a 63 y.o. female who has failed conservative treatment for ostoearthritis right knee.  Informed consent was obtained prior to anesthesia. The risks versus benefits of the operation were explain and in a way the patient can, and did, understand.   On the implant demand matching protocol, this patient scored 15.  Therefore, this patient was receive a polyethylene insert with vitamin E which is a high demand implant.  Description of procedure:     The patient was taken to the operating room and placed under anesthesia.  The patient was positioned in the usual fashion taking care that all body parts were adequately padded and/or protected.  I foley catheter was not placed.  A tourniquet was applied and the leg prepped and draped in the usual sterile fashion.  The extremity was exsanguinated with the esmarch and tourniquet inflated to 350 mmHg.  Pre-operative range of motion was normal.  The knee was in 5 degree of mild varus.  A midline incision approximately 6-7 inches long was made with a #10 blade.  A new blade was used to make a parapatellar arthrotomy going 2-3 cm into the quadriceps tendon, over the patella, and alongside the medial aspect of the patellar tendon.  A synovectomy was then performed with the #10 blade and forceps. I then elevated the deep MCL off the medial tibial metaphysis  subperiosteally around to the semimembranosus attachment.    I everted the patella and used calipers to measure patellar thickness.  I used the reamer to ream down to appropriate thickness to recreate the native thickness.  I then removed excess bone with the rongeur and sagittal saw.  I used the appropriately sized template and drilled the three lug holes.  I then put the trial in place and measured the thickness with the calipers to ensure recreation of the native thickness.  The trial was then removed and the patella subluxed and the knee brought into flexion.  A homan retractor was place to retract and protect the patella and lateral structures.  A Z-retractor was place medially to protect the medial structures.  The extra-medullary alignment system was used to make cut the tibial articular surface perpendicular to the anamotic axis of the tibia and in 3 degrees of posterior slope.  The cut surface and alignment jig was removed.  I then used the intramedullary alignment guide to make a 6 valgus cut on the distal femur.  I then marked out the epicondylar axis on the distal femur.  The posterior condylar axis measured 3 degrees.  I then used the anterior referencing sizer and measured the femur to be a size 5.  The 4-In-1 cutting block was screwed into place in external rotation matching the posterior condylar angle, making our cuts perpendicular to the epicondylar axis.  Anterior, posterior and chamfer cuts were made with the sagittal saw.  The cutting block and cut pieces were removed.  A  lamina spreader was placed in 90 degrees of flexion.  The ACL, PCL, menisci, and posterior condylar osteophytes were removed.  A 10 mm spacer blocked was found to offer good flexion and extension gap balance after minimal in degree releasing.   The scoop retractor was then placed and the femoral finishing block was pinned in place.  The small sagittal saw was used as well as the lug drill to finish the femur.  The block  and cut surfaces were removed and the medullary canal hole filled with autograft bone from the cut pieces.  The tibia was delivered forward in deep flexion and external rotation.  A size D tray was selected and pinned into place centered on the medial 1/3 of the tibial tubercle.  The reamer and keel was used to prepare the tibia through the tray.    I then trialed with the size 5 femur, size D tibia, a 10 mm insert and the 32 patella.  I had excellent flexion/extension gap balance, excellent patella tracking.  Flexion was full and beyond 120 degrees; extension was zero.  These components were chosen and the staff opened them to me on the back table while the knee was lavaged copiously and the cement mixed.  The soft tissue was infiltrated with 60cc of exparel 1.3% through a 21 gauge needle.  I cemented in the components and removed all excess cement.  The polyethylene tibial component was snapped into place and the knee placed in extension while cement was hardening.  The capsule was infilltrated with 30cc of .25% Marcaine with epinephrine.  A hemovac was place in the joint exiting superolaterally.  A pain pump was place superomedially superficial to the arthrotomy.  Once the cement was hard, the tourniquet was let down.  Hemostasis was obtained.  The arthrotomy was closed with figure-8 #1 vicryl sutures.  The deep soft tissues were closed with #0 vicryls and the subcuticular layer closed with a running #2-0 vicryl.  The skin was reapproximated and closed with skin staples.  The wound was dressed with xeroform, 4 x4's, 2 ABD sponges, a single layer of webril and a TED stocking.   The patient was then awakened, extubated, and taken to the recovery room in stable condition.  BLOOD LOSS:  300cc DRAINS: 1 hemovac, 1 pain catheter COMPLICATIONS:  None.  PLAN OF CARE: Admit to inpatient   PATIENT DISPOSITION:  PACU - hemodynamically stable.   Delay start of Pharmacological VTE agent (>24hrs) due to  surgical blood loss or risk of bleeding:  not applicable  Please fax a copy of this op note to my office at [phone] (please only include page 1 and 2 of the Case Information op note)

## 2013-11-17 NOTE — Plan of Care (Signed)
Problem: Consults Goal: Diagnosis- Total Joint Replacement Primary Total Knee     

## 2013-11-18 LAB — CBC
HCT: 32.8 % — ABNORMAL LOW (ref 36.0–46.0)
Hemoglobin: 10.6 g/dL — ABNORMAL LOW (ref 12.0–15.0)
MCH: 29.3 pg (ref 26.0–34.0)
MCHC: 32.3 g/dL (ref 30.0–36.0)
MCV: 90.6 fL (ref 78.0–100.0)
Platelets: 212 10*3/uL (ref 150–400)
RBC: 3.62 MIL/uL — ABNORMAL LOW (ref 3.87–5.11)
RDW: 14.3 % (ref 11.5–15.5)
WBC: 8.8 10*3/uL (ref 4.0–10.5)

## 2013-11-18 NOTE — Progress Notes (Signed)
Physical Therapy Treatment Patient Details Name: Brandy Dunlap MRN: [id number] DOB: November 14, 1950 Today's Date: 11/18/2013    History of Present Illness 63 y.o. female s/p right total knee arthroplasty. Hx of HTN, GERD, B12 deficiency, and acute kidney injury.    PT Comments    Patient stating that she feels much better this morning and is eager to DC home. Safe with ambulation and use of RW. Patient did practice one step as she has to get into the front door. Patient safe to D/C from a mobility standpoint based on progression towards goals set on PT eval.    Follow Up Recommendations  Home health PT;Supervision for mobility/OOB     Equipment Recommendations  3in1 (PT)    Recommendations for Other Services       Precautions / Restrictions Precautions Precautions: Knee Precaution Comments: Reviewed knee precautions Restrictions Weight Bearing Restrictions: Yes RLE Weight Bearing: Weight bearing as tolerated    Mobility  Bed Mobility               General bed mobility comments: Patient up in recliner before and after session  Transfers Overall transfer level: Modified independent                  Ambulation/Gait Ambulation/Gait assistance: Supervision Ambulation Distance (Feet): 300 Feet Assistive device: Rolling walker (2 wheeled) Gait Pattern/deviations: Step-through pattern;Decreased stride length Gait velocity: decreased and guarded   General Gait Details: Patient walking with step through gait pattern   Stairs Stairs: Yes Stairs assistance: Min guard Stair Management: Step to pattern;Forwards;With walker;No rails Number of Stairs: 1 General stair comments: Cued for sequence  Wheelchair Mobility    Modified Rankin (Stroke Patients Only)       Balance                                    Cognition Arousal/Alertness: Awake/alert Behavior During Therapy: WFL for tasks assessed/performed Overall Cognitive Status: Within  Functional Limits for tasks assessed                      Exercises Total Joint Exercises Quad Sets: AROM;Right;10 reps;Supine Heel Slides: AAROM;Right;10 reps Hip ABduction/ADduction: AAROM;Right;10 reps Straight Leg Raises: AAROM;Right;10 reps Long Arc Quad: AROM;Right;10 reps    General Comments        Pertinent Vitals/Pain Pain Assessment: No/denies pain    Home Living                      Prior Function            PT Goals (current goals can now be found in the care plan section) Progress towards PT goals: Progressing toward goals    Frequency  7X/week    PT Plan Current plan remains appropriate    Co-evaluation             End of Session Equipment Utilized During Treatment: Gait belt Activity Tolerance: Patient tolerated treatment well Patient left: in chair;with call bell/phone within reach     Time: 0755-0819 PT Time Calculation (min): 24 min  Charges:  $Gait Training: 8-22 mins $Therapeutic Exercise: 8-22 mins                    G Codes:      Brandy Dunlap 11/18/2013, 8:25 AM 11/18/2013 Brandy Dunlap PTA [phone] pager [phone] office

## 2013-11-18 NOTE — Care Management Note (Signed)
CARE MANAGEMENT NOTE 11/18/2013  Patient:  Brandy Dunlap, Brandy Dunlap   Account Number:  [id number]  Date Initiated:  11/18/2013  Documentation initiated by:  Ricki Miller  Subjective/Objective Assessment:   63 yr old female admitted with osteoarthritis of right knee, s/p right total knee arthroplasty.     Action/Plan:   case manager spoke with patient concerning home health and DME needs. Patient preoperatively setup with [facility], no changes. Has family support at discharge.   Anticipated DC Date:  11/18/2013   Anticipated DC Plan:  [facility]  CM consult      [facility] Choice  HOME HEALTH  DURABLE MEDICAL EQUIPMENT   Choice offered to / List presented to:  C-1 Patient   DME arranged  CPM  [facility] [employer]      DME agency  [facility] arranged  [place]   Status of service:  Completed, signed off Medicare Important Message given?   (If response is "NO", the following Medicare IM given date fields will be blank) Date Medicare IM given:   Medicare IM given by:   Date Additional Medicare IM given:   Additional Medicare IM given by:    Discharge Disposition:  [place]  Per UR Regulation:  Reviewed for med. necessity/level of care/duration of stay

## 2013-11-19 NOTE — Progress Notes (Signed)
I have reviewed the following note and agree with updated plan of care.  [address] [place], [place]

## 2013-11-28 NOTE — Discharge Summary (Signed)
SPORTS MEDICINE & JOINT REPLACEMENT   Brandy Mulch, MD   Brandy Spry, PA-C [place], [place], [place]  [address]                             [phone]  PATIENT ID: Brandy Dunlap        MRN:  [id number]          DOB/AGE: 03/17/1950 / 63 y.o.    DISCHARGE SUMMARY  ADMISSION DATE:    11/16/2013 DISCHARGE DATE:   11/18/2013  ADMISSION DIAGNOSIS: ostoearthritis right knee    DISCHARGE DIAGNOSIS:  ostoearthritis right knee    ADDITIONAL DIAGNOSIS: Active Problems:   S/P total knee arthroplasty  Past Medical History  Diagnosis Date  . Low back pain   . GERD (gastroesophageal reflux disease)   . Hypertension   . Osteoarthritis   . Sinus trouble   . Colon polyp 10/06/2003  . B12 deficiency   . Bronchitis     hx    PROCEDURE: Procedure(s): RIGHT TOTAL KNEE ARTHROPLASTY on 11/16/2013  CONSULTS:     HISTORY:  See H&P in chart  HOSPITAL COURSE:  Brandy Dunlap is a 63 y.o. admitted on 11/16/2013 and found to have a diagnosis of ostoearthritis right knee.  After appropriate laboratory studies were obtained  they were taken to the operating room on 11/16/2013 and underwent Procedure(s): RIGHT TOTAL KNEE ARTHROPLASTY.   They were given perioperative antibiotics:  Anti-infectives   Start     Dose/Rate Route Frequency Ordered Stop   11/16/13 1600  ceFAZolin (ANCEF) IVPB 2 g/50 mL premix     2 g 100 mL/hr over 30 Minutes Intravenous Every 6 hours 11/16/13 1537 11/17/13 0118   11/16/13 0600  ceFAZolin (ANCEF) IVPB 2 g/50 mL premix     2 g 100 mL/hr over 30 Minutes Intravenous On call to O.R. 11/15/13 1342 11/16/13 1045    .  Tolerated the procedure well.  Placed with a foley intraoperatively.  Given Ofirmev at induction and for 48 hours.    POD# 1: Vital signs were stable.  Patient denied Chest pain, shortness of breath, or calf pain.  Patient was started on Lovenox 30 mg subcutaneously twice daily at 8am.  Consults to PT, OT, and care management were made.  The  patient was weight bearing as tolerated.  CPM was placed on the operative leg 0-90 degrees for 6-8 hours a day.  Incentive spirometry was taught.  Dressing was changed.  Hemovac was discontinued.      POD #2, Continued  PT for ambulation and exercise program.  IV saline locked.  O2 discontinued.    The remainder of the hospital course was dedicated to ambulation and strengthening.   The patient was discharged on post op day 2 in  Good condition.  Blood products given:none  DIAGNOSTIC STUDIES: Recent vital signs: No data found.      Recent laboratory studies: No results found for this basename: WBC, HGB, HCT, PLT,  in the last 168 hours No results found for this basename: NA, K, CL, CO2, BUN, CREATININE, GLUCOSE, CALCIUM,  in the last 168 hours Lab Results  Component Value Date   INR 0.99 11/05/2013     Recent Radiographic Studies :  No results found.  DISCHARGE INSTRUCTIONS: Discharge Instructions   CPM    Complete by:  As directed   Continuous passive motion machine (CPM):      Use the  CPM from 0 to 90 for 6-8 hours per day.      You may increase by 10 per day.  You may break it up into 2 or 3 sessions per day.      Use CPM for 2 weeks or until you are told to stop.     Call MD / Call 911    Complete by:  As directed   If you experience chest pain or shortness of breath, CALL 911 and be transported to the hospital emergency room.  If you develope a fever above 101 F, pus (white drainage) or increased drainage or redness at the wound, or calf pain, call your surgeon's office.     Change dressing    Complete by:  As directed   Change dressing on Thursday, then change the dressing daily with sterile 4 x 4 inch gauze dressing and apply TED hose.     Constipation Prevention    Complete by:  As directed   Drink plenty of fluids.  Prune juice may be helpful.  You may use a stool softener, such as Colace (over the counter) 100 mg twice a day.  Use MiraLax (over the counter) for  constipation as needed.     Diet - low sodium heart healthy    Complete by:  As directed      Do not put a pillow under the knee. Place it under the heel.    Complete by:  As directed      Driving restrictions    Complete by:  As directed   No driving for 6 weeks     Increase activity slowly as tolerated    Complete by:  As directed      Lifting restrictions    Complete by:  As directed   No lifting for 6 weeks     TED hose    Complete by:  As directed   Use stockings (TED hose) for 2 weeks on both leg(s).  You may remove them at night for sleeping.           DISCHARGE MEDICATIONS:     Medication List         budesonide-formoterol 160-4.5 MCG/ACT inhaler  Commonly known as:  SYMBICORT  Inhale 2 puffs into the lungs 2 (two) times daily.     dexlansoprazole 60 MG capsule  Commonly known as:  DEXILANT  Take 1 capsule (60 mg total) by mouth daily.     enoxaparin 40 MG/0.4ML injection  Commonly known as:  LOVENOX  Inject 0.4 mLs (40 mg total) into the skin daily.     estrogen (conjugated)-medroxyprogesterone 0.45-1.5 MG per tablet  Commonly known as:  PREMPRO  Take 1 tablet by mouth daily.     methocarbamol 500 MG tablet  Commonly known as:  ROBAXIN  Take 1-2 tablets (500-1,000 mg total) by mouth every 6 (six) hours as needed for muscle spasms.     multivitamin capsule  Take 1 capsule by mouth daily.     ondansetron 4 MG tablet  Commonly known as:  ZOFRAN  Take 1 tablet (4 mg total) by mouth every 6 (six) hours as needed for nausea.     oxyCODONE 5 MG immediate release tablet  Commonly known as:  Oxy IR/ROXICODONE  Take 1-3 tablets (5-15 mg total) by mouth every 4 (four) hours as needed for breakthrough pain.     OxyCODONE 20 mg T12a 12 hr tablet  Commonly known as:  OXYCONTIN  Take 1 tablet (  20 mg total) by mouth every 12 (twelve) hours.     telmisartan 80 MG tablet  Commonly known as:  MICARDIS  Take 1 tablet (80 mg total) by mouth daily.        FOLLOW  UP VISIT:       Follow-up Information   Follow up with Rudean Haskell, MD. Call on 12/01/2013.   Specialty:  Orthopedic Surgery   Contact information:   [place] [address] [phone]       Follow up with [facility]. (Someone from [facility] will contact you concerning start date and time for physical therapy.)    Contact information:   [place] [address] [phone]       DISPOSITION: HOME   CONDITION:  Good   Hasset Chaviano 11/28/2013, 8:46 AM

## 2013-12-16 ENCOUNTER — Other Ambulatory Visit: Payer: Self-pay | Admitting: Family

## 2014-03-24 ENCOUNTER — Encounter: Payer: Self-pay | Admitting: Family

## 2014-03-24 ENCOUNTER — Ambulatory Visit (INDEPENDENT_AMBULATORY_CARE_PROVIDER_SITE_OTHER): Payer: BC Managed Care – PPO | Admitting: Family

## 2014-03-24 VITALS — BP 156/84 | HR 88 | Temp 98.8°F | Ht 62.0 in | Wt 202.0 lb

## 2014-03-24 DIAGNOSIS — R600 Localized edema: Secondary | ICD-10-CM

## 2014-03-24 DIAGNOSIS — R0789 Other chest pain: Secondary | ICD-10-CM

## 2014-03-24 DIAGNOSIS — Z8249 Family history of ischemic heart disease and other diseases of the circulatory system: Secondary | ICD-10-CM

## 2014-03-24 DIAGNOSIS — R079 Chest pain, unspecified: Secondary | ICD-10-CM

## 2014-03-24 DIAGNOSIS — R609 Edema, unspecified: Secondary | ICD-10-CM

## 2014-03-24 DIAGNOSIS — I10 Essential (primary) hypertension: Secondary | ICD-10-CM

## 2014-03-24 LAB — BASIC METABOLIC PANEL
BUN: 11 mg/dL (ref 6–23)
CO2: 26 mEq/L (ref 19–32)
Calcium: 9.1 mg/dL (ref 8.4–10.5)
Chloride: 106 mEq/L (ref 96–112)
Creatinine, Ser: 0.72 mg/dL (ref 0.40–1.20)
GFR: 86.84 mL/min (ref >60.00)
Glucose, Bld: 108 mg/dL — ABNORMAL HIGH (ref 70–99)
Potassium: 4.5 mEq/L (ref 3.5–5.1)
Sodium: 138 mEq/L (ref 135–145)

## 2014-03-24 LAB — BRAIN NATRIURETIC PEPTIDE: Pro B Natriuretic peptide (BNP): 28 pg/mL (ref 0.0–100.0)

## 2014-03-24 MED ORDER — MONTELUKAST SODIUM 10 MG PO TABS: 10.0000 mg | ORAL_TABLET | Freq: Every day | ORAL | Status: DC

## 2014-03-24 MED ORDER — DEXLANSOPRAZOLE 60 MG PO CPDR: 60.0000 mg | DELAYED_RELEASE_CAPSULE | Freq: Every day | ORAL | Status: DC

## 2014-03-24 MED ORDER — TELMISARTAN 80 MG PO TABS: 80.0000 mg | ORAL_TABLET | Freq: Every day | ORAL | Status: DC

## 2014-03-24 NOTE — Progress Notes (Signed)
Pre visit review using our clinic review tool, if applicable. No additional management support is needed unless otherwise documented below in the visit note. 

## 2014-03-24 NOTE — Progress Notes (Signed)
Subjective:    Patient ID: Brandy Dunlap, female    DOB: [birth year], 64 y.o.   MRN: [id number]  HPI 64 y.o. White female presents today with chief complaints of left leg swelling and chest pressure. Pt states that the chest pressure began one week ago, it occurs "on and off". Pt states the pressure does not radiate but the pressure IS reproducible with pressure places to chest wall. Denies nausea, chest pain, SOB, dizziness. Pt does have extensive family history of heart disease; brother had myocardial infarction at age 59 and mother had open heart surgery at age 95 per patient. Pt denies taking any medication for chest pressure. Pt states that left leg swelling began 2 weeks ago, it is more severe some days.Pt states that the swelling feels like pressure and gives her an itching sensation. Denies warmth, tenderness and difficulty walking on left leg. Pt eats a heart healthy, low salt diet and denies increased activity or prolonged ambulation. Pt denies SOB, chest pain, chills, fatigue and changes in appetite.   Past Medical History  Diagnosis Date  . Low back pain   . GERD (gastroesophageal reflux disease)   . Hypertension   . Osteoarthritis   . Sinus trouble   . Colon polyp 10/06/2003  . B12 deficiency   . Bronchitis     hx    History   Social History  . Marital Status: Married    Spouse Name: N/A    Number of Children: 1  . Years of Education: N/A   Occupational History  . teacher    Social History Main Topics  . Smoking status: Never Smoker   . Smokeless tobacco: Never Used  . Alcohol Use: No  . Drug Use: No  . Sexual Activity: Yes   Other Topics Concern  . Not on file   Social History Narrative    Past Surgical History  Procedure Laterality Date  . Lumbar fusion  2008  . Cholecystectomy    . Tonsillectomy    . Partial hip arthroplasty Right 2009     hip replacement  . Colonoscopy w/ biopsies  10/06/2003  . Video bronchoscopy Bilateral 12/05/2012    Procedure:  VIDEO BRONCHOSCOPY WITHOUT FLUORO;  Surgeon: Collene Gobble, MD;  Location: [facility];  Service: Cardiopulmonary;  Laterality: Bilateral;  . Back surgery    . Total knee arthroplasty Right 11/16/2013    Procedure: RIGHT TOTAL KNEE ARTHROPLASTY;  Surgeon: Vickey Huger, MD;  Location: [place];  Service: Orthopedics;  Laterality: Right;    Family History  Problem Relation Age of Onset  . COPD Father     died at age 67  . Emphysema Father   . Heart disease Brother     stent with 90%  . COPD Mother   . Heart disease Mother   . Ovarian cancer Mother   . Emphysema Mother     Allergies  Allergen Reactions  . Pantoprazole Sodium Other (See Comments)    Racing heart    Current Outpatient Prescriptions on File Prior to Visit  Medication Sig Dispense Refill  . budesonide-formoterol (SYMBICORT) 160-4.5 MCG/ACT inhaler Inhale 2 puffs into the lungs 2 (two) times daily.    [name] estrogen, conjugated,-medroxyprogesterone (PREMPRO) 0.45-1.5 MG per tablet Take 1 tablet by mouth daily.    . Multiple Vitamin (MULTIVITAMIN) capsule Take 1 capsule by mouth daily.       No current facility-administered medications on file prior to visit.    BP 156/84 mmHg  Pulse  88  Temp(Src) 98.8 F (37.1 C) (Oral)  Ht 5\' 2"  (1.575 m)  Wt 202 lb (91.627 kg)  BMI 36.94 kg/m2chart  Review of Systems  Constitutional: Negative.  Negative for chills, activity change and appetite change.  Respiratory: Negative.  Negative for cough, chest tightness and shortness of breath.   Cardiovascular: Positive for leg swelling. Negative for chest pain.       Pt acknowledges chest pressure x 1 week. Acknowledges left leg swelling x 2 weeks.   Gastrointestinal: Negative.   Endocrine: Positive for cold intolerance.  Musculoskeletal: Negative.   Neurological: Negative.  Negative for dizziness, weakness and light-headedness.  Psychiatric/Behavioral: Negative.  The patient is not nervous/anxious.   All other systems reviewed and  are negative.      Objective:   Physical Exam  Constitutional: She is oriented to person, place, and time. She appears well-developed and well-nourished. She is active.  HENT:  Right Ear: External ear normal.  Left Ear: External ear normal.  Nose: Nose normal.  Mouth/Throat: Oropharynx is clear and moist.  Neck: Normal range of motion. Neck supple.  Cardiovascular: Normal rate, regular rhythm, normal heart sounds and normal pulses.   Chest pressure reproducible with pressure placed to chest.   Pulmonary/Chest: Effort normal and breath sounds normal.  Abdominal: Soft. Normal appearance and bowel sounds are normal.  Musculoskeletal: Normal range of motion.  Neurological: She is alert and oriented to person, place, and time.  Skin: Skin is warm and dry.  Psychiatric: She has a normal mood and affect. Her speech is normal and behavior is normal. Her mood appears not anxious. She does not exhibit a depressed mood.          Assessment & Plan:  64 y.o. White female presents today with chief complaints of chest pressure x 1 week and left leg swelling x 2 weeks.  - Chest pressure: EKG performed and consistent with EKG from one year ago which shows right bundle branch block.   - Due to strong family hx of heart disease and for pt reassurance,  will order Nuclear stress test.   - Chest pressure is reproducible when manually putting pressure to  chest wall.  - Left leg swelling- BNP   - BMP to examine electrolytes and kidney function since pt has hx   Of kidney disease. Education: Discussed atypical symptoms of myocardial infarction in women since patient has a strong family hx of heart diease. Discussed low sodium, heart healthy diet.   Follow up - pending results of test.

## 2014-03-24 NOTE — Patient Instructions (Signed)
Peripheral Edema °You have swelling in your legs (peripheral edema). This swelling is due to excess accumulation of salt and water in your body. Edema may be a sign of heart, kidney or liver disease, or a side effect of a medication. It may also be due to problems in the leg veins. Elevating your legs and using special support stockings may be very helpful, if the cause of the swelling is due to poor venous circulation. Avoid long periods of standing, whatever the cause. °Treatment of edema depends on identifying the cause. Chips, pretzels, pickles and other salty foods should be avoided. Restricting salt in your diet is almost always needed. Water pills (diuretics) are often used to remove the excess salt and water from your body via urine. These medicines prevent the kidney from reabsorbing sodium. This increases urine flow. °Diuretic treatment may also result in lowering of potassium levels in your body. Potassium supplements may be needed if you have to use diuretics daily. Daily weights can help you keep track of your progress in clearing your edema. You should call your caregiver for follow up care as recommended. °SEEK IMMEDIATE MEDICAL CARE IF:  °· You have increased swelling, pain, redness, or heat in your legs. °· You develop shortness of breath, especially when lying down. °· You develop chest or abdominal pain, weakness, or fainting. °· You have a fever. °Document Released: 03/22/2004 Document Revised: 05/07/2011 Document Reviewed: 03/02/2009 °ExitCare® Patient Information ©2015 [employer]. This information is not intended to replace advice given to you by your health care provider. Make sure you discuss any questions you have with your health care provider. ° °

## 2014-03-29 ENCOUNTER — Ambulatory Visit (HOSPITAL_COMMUNITY): Payer: BC Managed Care – PPO | Attending: Cardiovascular Disease | Admitting: Radiology

## 2014-03-29 DIAGNOSIS — I4510 Unspecified right bundle-branch block: Secondary | ICD-10-CM | POA: Insufficient documentation

## 2014-03-29 DIAGNOSIS — I10 Essential (primary) hypertension: Secondary | ICD-10-CM | POA: Insufficient documentation

## 2014-03-29 DIAGNOSIS — R609 Edema, unspecified: Secondary | ICD-10-CM

## 2014-03-29 DIAGNOSIS — R0789 Other chest pain: Secondary | ICD-10-CM | POA: Diagnosis present

## 2014-03-29 DIAGNOSIS — Z8249 Family history of ischemic heart disease and other diseases of the circulatory system: Secondary | ICD-10-CM | POA: Insufficient documentation

## 2014-03-29 MED ORDER — TECHNETIUM TC 99M SESTAMIBI GENERIC - CARDIOLITE
30.0000 | Freq: Once | INTRAVENOUS | Status: AC | PRN
  Administered 2014-03-29: 30 via INTRAVENOUS

## 2014-03-29 MED ORDER — REGADENOSON 0.4 MG/5ML IV SOLN
0.4000 mg | Freq: Once | INTRAVENOUS | Status: AC
  Administered 2014-03-29: 0.4 mg via INTRAVENOUS

## 2014-03-29 MED ORDER — TECHNETIUM TC 99M SESTAMIBI GENERIC - CARDIOLITE
10.0000 | Freq: Once | INTRAVENOUS | Status: AC | PRN
  Administered 2014-03-29: 10 via INTRAVENOUS

## 2014-03-29 MED ORDER — AMINOPHYLLINE 25 MG/ML IV SOLN
75.0000 mg | Freq: Once | INTRAVENOUS | Status: AC
  Administered 2014-03-29: 75 mg via INTRAVENOUS

## 2014-03-29 NOTE — Progress Notes (Signed)
[facility]UCLEAR [facility] [address] [place], [place] [address] [phone]    Cardiology Nuclear Med Study  Brandy Dunlap is a 64 y.o. female     MRN : [id number]     DOB: 1950-11-16  Procedure Date: 03/29/2014  Nuclear Med Background Indication for Stress Test:  Evaluation for Ischemia History:  Asthma and 10/10/11 NL EF: 73% Cardiac Risk Factors: Family History - CAD, Hypertension and IRBBB  Symptoms:  Chest Pressure.  (last date of chest discomfort 2 days ago)   Nuclear Pre-Procedure Caffeine/Decaff Intake:  None> 12 hrs NPO After: 7:00pm   Lungs:  clear O2 Sat: 98% on room air. IV 0.9% NS with Angio Cath:  24g  IV Site: R Hand x 1, tolerated well IV Started by:  Irven Baltimore, RN  Chest Size (in):  40 Cup Size: C  Height: 5\' 2"  (1.575 m)  Weight:  201 lb (91.173 kg)  BMI:  Body mass index is 36.75 kg/(m^2). Tech Comments:  Patient used Symbicort Inhaler at [employer] today, and [employer] last night. Irven Baltimore, RN. Aminophylline 75 mg IV given for symptoms.    Nuclear Med Study 1 or 2 day study: 1 day  Stress Test Type:  Lexiscan  Reading MD: N/A  Order Authorizing Provider:  Roxy Cedar, FNP  Resting Radionuclide: Technetium 13m Sestamibi  Resting Radionuclide Dose: 11.0 mCi   Stress Radionuclide:  Technetium 60m Sestamibi  Stress Radionuclide Dose: 33.0 mCi           Stress Protocol Rest HR: 86 Stress HR: 117  Rest BP: 160/83 Stress BP: 172/75  Exercise Time (min): n/a METS: n/a   Predicted Max HR: 157 bpm % Max HR: 74.52 bpm Rate Pressure Product: 20124   Dose of Adenosine (mg):  n/a Dose of Lexiscan: 0.4 mg  Dose of Atropine (mg): n/a Dose of Dobutamine: n/a mcg/kg/min (at max HR)  Stress Test Technologist: Perrin Maltese, EMT-P  Nuclear Technologist:  Earl Many, CNMT     Rest Procedure:  Myocardial perfusion imaging was performed at rest 45 minutes following the intravenous administration of Technetium 79m Sestamibi. Rest ECG:  NSR with non-specific ST-T wave changes  Stress Procedure:  The patient received IV Lexiscan 0.4 mg over 15-seconds.  Technetium 64m Sestamibi injected at 30-seconds. This patient had chest pressure, was hot,and had a headache with the Lexiscan injection. Quantitative spect images were obtained after a 45 minute delay. Stress ECG: No significant change from baseline ECG  QPS Raw Data Images:  Normal; no motion artifact; normal heart/lung ratio. Stress Images:  Normal homogeneous uptake in all areas of the myocardium. Rest Images:  Normal homogeneous uptake in all areas of the myocardium. Subtraction (SDS):  No evidence of ischemia. Transient Ischemic Dilatation (Normal <1.22):  0.96 Lung/Heart Ratio (Normal <0.45):  0.25  Quantitative Gated Spect Images QGS EDV:  61 ml QGS ESV:  12 ml  Impression Exercise Capacity:  Lexiscan with no exercise. BP Response:  Hypertensive blood pressure response. Clinical Symptoms:  No significant symptoms noted. ECG Impression:  No significant ECG changes with Lexiscan. Comparison with Prior Nuclear Study: No significant change from previous study  Overall Impression:  Normal stress nuclear study.  LV Ejection Fraction: 80%.  LV Wall Motion:  NL LV Function; NL Wall Motion   Sanda Klein, MD, [facility] [phone] office [phone] pager

## 2014-04-01 ENCOUNTER — Other Ambulatory Visit: Payer: Self-pay

## 2014-04-01 ENCOUNTER — Encounter: Payer: Self-pay | Admitting: Family

## 2014-04-01 MED ORDER — CONJ ESTROG-MEDROXYPROGEST ACE 0.45-1.5 MG PO TABS: 1.0000 | ORAL_TABLET | Freq: Every day | ORAL | Status: DC

## 2014-05-10 ENCOUNTER — Ambulatory Visit (INDEPENDENT_AMBULATORY_CARE_PROVIDER_SITE_OTHER): Payer: BC Managed Care – PPO | Admitting: Internal Medicine

## 2014-05-10 ENCOUNTER — Encounter: Payer: Self-pay | Admitting: Internal Medicine

## 2014-05-10 VITALS — BP 130/62 | HR 91 | Temp 98.6°F | Wt 202.0 lb

## 2014-05-10 DIAGNOSIS — J3089 Other allergic rhinitis: Secondary | ICD-10-CM

## 2014-05-10 DIAGNOSIS — I10 Essential (primary) hypertension: Secondary | ICD-10-CM

## 2014-05-10 MED ORDER — FLUTICASONE PROPIONATE 50 MCG/ACT NA SUSP: 2.0000 | Freq: Every day | NASAL | Status: DC

## 2014-05-10 MED ORDER — PREDNISONE 20 MG PO TABS: 20.0000 mg | ORAL_TABLET | Freq: Two times a day (BID) | ORAL | Status: DC

## 2014-05-10 NOTE — Progress Notes (Signed)
Subjective:    Patient ID: Brandy Dunlap, female    DOB: January 02, 1951, 64 y.o.   MRN: [id number]  HPI  64 year old patient who has a history of allergic rhinitis and COPD.  She presents with a two-week history of sinus congestion and headaches.  She has tried the Zyrtec and remains symptomatic.  She has also used fluticasone sparingly.  She is leaving on vacation in 2 weeks and was concerned about her lack of progress.  There's been no fever, focal sinus pain.  No pain or purulent discharge.  She remains on Symbicort but no significant pulmonary symptoms  Past Medical History  Diagnosis Date  . Low back pain   . GERD (gastroesophageal reflux disease)   . Hypertension   . Osteoarthritis   . Sinus trouble   . Colon polyp 10/06/2003  . B12 deficiency   . Bronchitis     hx    History   Social History  . Marital Status: Married    Spouse Name: N/A  . Number of Children: 1  . Years of Education: N/A   Occupational History  . teacher    Social History Main Topics  . Smoking status: Never Smoker   . Smokeless tobacco: Never Used  . Alcohol Use: No  . Drug Use: No  . Sexual Activity: Yes   Other Topics Concern  . Not on file   Social History Narrative    Past Surgical History  Procedure Laterality Date  . Lumbar fusion  2008  . Cholecystectomy    . Tonsillectomy    . Partial hip arthroplasty Right 2009     hip replacement  . Colonoscopy w/ biopsies  10/06/2003  . Video bronchoscopy Bilateral 12/05/2012    Procedure: VIDEO BRONCHOSCOPY WITHOUT FLUORO;  Surgeon: Collene Gobble, MD;  Location: [facility];  Service: Cardiopulmonary;  Laterality: Bilateral;  . Back surgery    . Total knee arthroplasty Right 11/16/2013    Procedure: RIGHT TOTAL KNEE ARTHROPLASTY;  Surgeon: Vickey Huger, MD;  Location: [facility];  Service: Orthopedics;  Laterality: Right;    Family History  Problem Relation Age of Onset  . COPD Father     died at age 38  . Emphysema Father   . Heart  disease Brother     stent with 90%  . COPD Mother   . Heart disease Mother   . Ovarian cancer Mother   . Emphysema Mother     Allergies  Allergen Reactions  . Pantoprazole Sodium Other (See Comments)    Racing heart    Current Outpatient Prescriptions on File Prior to Visit  Medication Sig Dispense Refill  . budesonide-formoterol (SYMBICORT) 160-4.5 MCG/ACT inhaler Inhale 2 puffs into the lungs 2 (two) times daily.    [name] dexlansoprazole (DEXILANT) 60 MG capsule Take 1 capsule (60 mg total) by mouth daily. 90 capsule 1  . estrogen, conjugated,-medroxyprogesterone (PREMPRO) 0.45-1.5 MG per tablet Take 1 tablet by mouth daily. 90 tablet 1  . montelukast (SINGULAIR) 10 MG tablet Take 1 tablet (10 mg total) by mouth at bedtime. 90 tablet 1  . Multiple Vitamin (MULTIVITAMIN) capsule Take 1 capsule by mouth daily.      [name] PROAIR RESPICLICK 330 (90 BASE) MCG/ACT AEPB Inhale 2 puffs into the lungs every 6 (six) hours as needed.  0  . telmisartan (MICARDIS) 80 MG tablet Take 1 tablet (80 mg total) by mouth daily. 90 tablet 1   No current facility-administered medications on file prior to visit.  BP 130/62 mmHg  Pulse 91  Temp(Src) 98.6 F (37 C)  Wt 202 lb (91.627 kg)  SpO2 95%     Review of Systems  Constitutional: Positive for fatigue.  HENT: Positive for congestion, postnasal drip, rhinorrhea and sinus pressure. Negative for dental problem, hearing loss, sore throat and tinnitus.   Eyes: Negative for pain, discharge and visual disturbance.  Respiratory: Positive for cough. Negative for shortness of breath.   Cardiovascular: Negative for chest pain, palpitations and leg swelling.  Gastrointestinal: Negative for nausea, vomiting, abdominal pain, diarrhea, constipation, blood in stool and abdominal distention.  Genitourinary: Negative for dysuria, urgency, frequency, hematuria, flank pain, vaginal bleeding, vaginal discharge, difficulty urinating, vaginal pain and pelvic pain.    Musculoskeletal: Negative for joint swelling, arthralgias and gait problem.  Skin: Negative for rash.  Neurological: Negative for dizziness, syncope, speech difficulty, weakness, numbness and headaches.  Hematological: Negative for adenopathy.  Psychiatric/Behavioral: Negative for behavioral problems, dysphoric mood and agitation. The patient is not nervous/anxious.        Objective:   Physical Exam  Constitutional: She is oriented to person, place, and time. She appears well-developed and well-nourished.  HENT:  Head: Normocephalic.  Right Ear: External ear normal.  Left Ear: External ear normal.  Mouth/Throat: Oropharynx is clear and moist.  Eyes: Conjunctivae and EOM are normal. Pupils are equal, round, and reactive to light.  Neck: Normal range of motion. Neck supple. No thyromegaly present.  Cardiovascular: Normal rate, regular rhythm, normal heart sounds and intact distal pulses.   Pulmonary/Chest: No respiratory distress. She has no wheezes.  Abdominal: Soft. Bowel sounds are normal. She exhibits no mass. There is no tenderness.  Musculoskeletal: Normal range of motion.  Lymphadenopathy:    She has no cervical adenopathy.  Neurological: She is alert and oriented to person, place, and time.  Skin: Skin is warm and dry. No rash noted.  Psychiatric: She has a normal mood and affect. Her behavior is normal.          Assessment & Plan:   Viral URI.  Will treat symptomatically Patient instructions discussed and dispensed

## 2014-05-10 NOTE — Progress Notes (Signed)
Pre visit review using our clinic review tool, if applicable. No additional management support is needed unless otherwise documented below in the visit note. 

## 2014-05-10 NOTE — Patient Instructions (Addendum)
Acute sinusitis symptoms are generally not helped by antibiotic therapy.  Use saline irrigation, warm  moist compresses and over-the-counter decongestants only as directed.  Call if there is no improvement in 5 to 7 days, or sooner if you develop increasing pain, fever, or any new symptoms.  Mucinex D twice daily   Most cases of acute sinusitis are related to a viral infection and will resolve on their own within 10 days. Sometimes medicines are prescribed to help relieve symptoms (pain medicine, decongestants, nasal steroid sprays, or saline sprays).    HOME CARE INSTRUCTIONS   Drink plenty of water. Water helps thin the mucus so your sinuses can drain more easily.  Use a humidifier.  Inhale steam 3 to 4 times a day (for example, sit in the bathroom with the shower running).  Apply a warm, moist washcloth to your face 3 to 4 times a day, or as directed by your health care provider.  Use saline nasal sprays to help moisten and clean your sinuses.  Take medicines only as directed by your health care provider.  If you were prescribed either an antibiotic or antifungal medicine, finish it all even if you start to feel better.   Call if you develop fever, worsening headaches or you develop new or worsening symptoms

## 2014-07-19 ENCOUNTER — Ambulatory Visit (INDEPENDENT_AMBULATORY_CARE_PROVIDER_SITE_OTHER): Payer: BC Managed Care – PPO | Admitting: Adult Health

## 2014-07-19 ENCOUNTER — Encounter: Payer: Self-pay | Admitting: Adult Health

## 2014-07-19 VITALS — BP 110/82 | HR 87 | Temp 98.2°F | Wt 201.1 lb

## 2014-07-19 DIAGNOSIS — R05 Cough: Secondary | ICD-10-CM | POA: Diagnosis not present

## 2014-07-19 DIAGNOSIS — R059 Cough, unspecified: Secondary | ICD-10-CM

## 2014-07-19 DIAGNOSIS — H65193 Other acute nonsuppurative otitis media, bilateral: Secondary | ICD-10-CM

## 2014-07-19 MED ORDER — BENZONATATE 200 MG PO CAPS: 200.0000 mg | ORAL_CAPSULE | Freq: Three times a day (TID) | ORAL | Status: DC | PRN

## 2014-07-19 MED ORDER — CEFDINIR 300 MG PO CAPS: 300.0000 mg | ORAL_CAPSULE | Freq: Two times a day (BID) | ORAL | Status: DC

## 2014-07-19 NOTE — Progress Notes (Signed)
Subjective:    Patient ID: Brandy Dunlap, female    DOB: [birth year], 64 y.o.   MRN: [id number]  HPI  Patient presents to the office today for bilateral ear pain L>R, sore throat and dry cough for two weeks. She has been trying to treat it with Advil. She also endorses that " glands down my throat are swollen."   No fever, no sick contacts, nausea, vomiting, diarrhea.   Review of Systems  Constitutional: Negative.  Negative for fever, chills and fatigue.  HENT: Positive for ear pain (L>R), postnasal drip, rhinorrhea and sore throat. Negative for ear discharge, facial swelling, hearing loss, sinus pressure, sneezing and tinnitus.   Eyes: Negative for pain and itching.  Respiratory: Positive for cough (non productive). Negative for shortness of breath and wheezing.   Cardiovascular: Negative for chest pain and palpitations.  Musculoskeletal: Negative for myalgias.  Neurological: Negative for dizziness, weakness, light-headedness and headaches.  Hematological: Positive for adenopathy.  All other systems reviewed and are negative.  Past Medical History  Diagnosis Date  . Low back pain   . GERD (gastroesophageal reflux disease)   . Hypertension   . Osteoarthritis   . Sinus trouble   . Colon polyp 10/06/2003  . B12 deficiency   . Bronchitis     hx    History   Social History  . Marital Status: Married    Spouse Name: N/A  . Number of Children: 1  . Years of Education: N/A   Occupational History  . teacher    Social History Main Topics  . Smoking status: Never Smoker   . Smokeless tobacco: Never Used  . Alcohol Use: No  . Drug Use: No  . Sexual Activity: Yes   Other Topics Concern  . Not on file   Social History Narrative    Past Surgical History  Procedure Laterality Date  . Lumbar fusion  2008  . Cholecystectomy    . Tonsillectomy    . Partial hip arthroplasty Right 2009     hip replacement  . Colonoscopy w/ biopsies  10/06/2003  . Video bronchoscopy  Bilateral 12/05/2012    Procedure: VIDEO BRONCHOSCOPY WITHOUT FLUORO;  Surgeon: Collene Gobble, MD;  Location: [facility];  Service: Cardiopulmonary;  Laterality: Bilateral;  . Back surgery    . Total knee arthroplasty Right 11/16/2013    Procedure: RIGHT TOTAL KNEE ARTHROPLASTY;  Surgeon: Vickey Huger, MD;  Location: [place];  Service: Orthopedics;  Laterality: Right;    Family History  Problem Relation Age of Onset  . COPD Father     died at age 66  . Emphysema Father   . Heart disease Brother     stent with 90%  . COPD Mother   . Heart disease Mother   . Ovarian cancer Mother   . Emphysema Mother     Allergies  Allergen Reactions  . Pantoprazole Sodium Other (See Comments)    Racing heart    Current Outpatient Prescriptions on File Prior to Visit  Medication Sig Dispense Refill  . budesonide-formoterol (SYMBICORT) 160-4.5 MCG/ACT inhaler Inhale 2 puffs into the lungs 2 (two) times daily.    [name] dexlansoprazole (DEXILANT) 60 MG capsule Take 1 capsule (60 mg total) by mouth daily. 90 capsule 1  . estrogen, conjugated,-medroxyprogesterone (PREMPRO) 0.45-1.5 MG per tablet Take 1 tablet by mouth daily. 90 tablet 1  . fluticasone (FLONASE) 50 MCG/ACT nasal spray Place 2 sprays into both nostrils daily. 16 g 6  . montelukast (SINGULAIR)  10 MG tablet Take 1 tablet (10 mg total) by mouth at bedtime. 90 tablet 1  . Multiple Vitamin (MULTIVITAMIN) capsule Take 1 capsule by mouth daily.      [name] PROAIR RESPICLICK 924 (90 BASE) MCG/ACT AEPB Inhale 2 puffs into the lungs every 6 (six) hours as needed.  0  . telmisartan (MICARDIS) 80 MG tablet Take 1 tablet (80 mg total) by mouth daily. 90 tablet 1   No current facility-administered medications on file prior to visit.    BP 110/82 mmHg  Pulse 87  Temp(Src) 98.2 F (36.8 C) (Oral)  Wt 201 lb 1.6 oz (91.218 kg)  SpO2 99%         Objective:   Physical Exam  Constitutional: She is oriented to person, place, and time. She appears  well-developed and well-nourished. No distress.  HENT:  Head: Normocephalic and atraumatic.  Right Ear: External ear normal.  Left Ear: External ear normal.  Mouth/Throat: Oropharyngeal exudate present.  Otitis media without effusion in left ear.  Otitis media without effusion in right ear.   No discharge  Cardiovascular: Normal rate, regular rhythm, normal heart sounds and intact distal pulses.  Exam reveals no gallop and no friction rub.   No murmur heard. Pulmonary/Chest: Effort normal and breath sounds normal. No respiratory distress. She has no wheezes. She has no rales. She exhibits no tenderness.  Lymphadenopathy:    She has cervical adenopathy (superficial cervical, deep cervical and tonsilar( bilateral)).  Neurological: She is alert and oriented to person, place, and time.  Skin: Skin is warm and dry. She is not diaphoretic.  Psychiatric: She has a normal mood and affect. Her behavior is normal. Judgment and thought content normal.  Vitals reviewed.      Assessment & Plan:  1. Acute nonsuppurative otitis media of both ears - cefdinir (OMNICEF) 300 MG capsule; Take 1 capsule (300 mg total) by mouth 2 (two) times daily.  Dispense: 10 capsule; Refill: 0 - Follow up if no improvement in 2-3 days.  - Follow up sooner if symptoms worsen.  2. Cough - benzonatate (TESSALON) 200 MG capsule; Take 1 capsule (200 mg total) by mouth 3 (three) times daily as needed for cough.  Dispense: 20 capsule; Refill: 0 -- Follow up if no improvement in 2-3 days.  - Follow up sooner if symptoms worsen.

## 2014-07-19 NOTE — Progress Notes (Signed)
Pre visit review using our clinic review tool, if applicable. No additional management support is needed unless otherwise documented below in the visit note. 

## 2014-07-19 NOTE — Patient Instructions (Addendum)
I have sent a prescription to the pharmacy for [facility], this is your antibiotic. Please take it as directed. The tessalon pearls for you cough can be taken up to three times a day as needed. If you are not feeling any better in the next 2-3 days then please follow up. Otitis Media Otitis media is redness, soreness, and inflammation of the middle ear. Otitis media may be caused by allergies or, most commonly, by infection. Often it occurs as a complication of the common cold. SIGNS AND SYMPTOMS Symptoms of otitis media may include:  Earache.  Fever.  Ringing in your ear.  Headache.  Leakage of fluid from the ear. DIAGNOSIS To diagnose otitis media, your health care provider will examine your ear with an otoscope. This is an instrument that allows your health care provider to see into your ear in order to examine your eardrum. Your health care provider also will ask you questions about your symptoms. TREATMENT  Typically, otitis media resolves on its own within 3-5 days. Your health care provider may prescribe medicine to ease your symptoms of pain. If otitis media does not resolve within 5 days or is recurrent, your health care provider may prescribe antibiotic medicines if he or she suspects that a bacterial infection is the cause. HOME CARE INSTRUCTIONS   If you were prescribed an antibiotic medicine, finish it all even if you start to feel better.  Take medicines only as directed by your health care provider.  Keep all follow-up visits as directed by your health care provider. SEEK MEDICAL CARE IF:  You have otitis media only in one ear, or bleeding from your nose, or both.  You notice a lump on your neck.  You are not getting better in 3-5 days.  You feel worse instead of better. SEEK IMMEDIATE MEDICAL CARE IF:   You have pain that is not controlled with medicine.  You have swelling, redness, or pain around your ear or stiffness in your neck.  You notice that part of your  face is paralyzed.  You notice that the bone behind your ear (mastoid) is tender when you touch it. MAKE SURE YOU:   Understand these instructions.  Will watch your condition.  Will get help right away if you are not doing well or get worse. Document Released: 11/18/2003 Document Revised: 06/29/2013 Document Reviewed: 09/09/2012 [facility] Patient Information 2015 [place], [place]. This information is not intended to replace advice given to you by your health care provider. Make sure you discuss any questions you have with your health care provider.

## 2014-07-27 ENCOUNTER — Other Ambulatory Visit: Payer: Self-pay | Admitting: Adult Health

## 2014-07-27 DIAGNOSIS — R05 Cough: Secondary | ICD-10-CM

## 2014-07-27 DIAGNOSIS — R059 Cough, unspecified: Secondary | ICD-10-CM

## 2014-07-27 MED ORDER — BENZONATATE 200 MG PO CAPS: 200.0000 mg | ORAL_CAPSULE | Freq: Three times a day (TID) | ORAL | Status: DC | PRN

## 2014-07-27 NOTE — Addendum Note (Signed)
Addended by: Apolinar Junes on: 07/27/2014 01:59 PM   Modules accepted: Orders

## 2014-10-01 ENCOUNTER — Encounter: Payer: Self-pay | Admitting: Family

## 2014-10-04 ENCOUNTER — Encounter: Payer: BC Managed Care – PPO | Admitting: Family

## 2014-10-05 ENCOUNTER — Other Ambulatory Visit: Payer: Self-pay | Admitting: Family

## 2014-10-08 ENCOUNTER — Encounter: Payer: Self-pay | Admitting: Family

## 2014-10-08 ENCOUNTER — Ambulatory Visit (INDEPENDENT_AMBULATORY_CARE_PROVIDER_SITE_OTHER): Payer: BC Managed Care – PPO | Admitting: Family

## 2014-10-08 VITALS — BP 120/80 | HR 85 | Temp 98.6°F | Ht 63.0 in | Wt 200.5 lb

## 2014-10-08 DIAGNOSIS — J45901 Unspecified asthma with (acute) exacerbation: Secondary | ICD-10-CM | POA: Diagnosis not present

## 2014-10-08 DIAGNOSIS — Z0000 Encounter for general adult medical examination without abnormal findings: Secondary | ICD-10-CM

## 2014-10-08 DIAGNOSIS — E538 Deficiency of other specified B group vitamins: Secondary | ICD-10-CM | POA: Diagnosis not present

## 2014-10-08 DIAGNOSIS — K219 Gastro-esophageal reflux disease without esophagitis: Secondary | ICD-10-CM

## 2014-10-08 DIAGNOSIS — J45909 Unspecified asthma, uncomplicated: Secondary | ICD-10-CM | POA: Insufficient documentation

## 2014-10-08 DIAGNOSIS — I10 Essential (primary) hypertension: Secondary | ICD-10-CM

## 2014-10-08 LAB — LIPID PANEL
Cholesterol: 194 mg/dL (ref 0–200)
HDL: 54.5 mg/dL (ref >39.00)
LDL Cholesterol: 112 mg/dL — ABNORMAL HIGH (ref 0–99)
NonHDL: 139.81
Total CHOL/HDL Ratio: 4
Triglycerides: 141 mg/dL (ref 0.0–149.0)
VLDL: 28.2 mg/dL (ref 0.0–40.0)

## 2014-10-08 LAB — TSH: TSH: 1.24 u[IU]/mL (ref 0.35–4.50)

## 2014-10-08 LAB — URINALYSIS, ROUTINE W REFLEX MICROSCOPIC
Bilirubin Urine: NEGATIVE
Ketones, ur: NEGATIVE
Leukocytes, UA: NEGATIVE
Nitrite: NEGATIVE
RBC / HPF: NONE SEEN (ref 0–?)
Specific Gravity, Urine: 1.02 (ref 1.000–1.030)
Total Protein, Urine: NEGATIVE
Urine Glucose: NEGATIVE
Urobilinogen, UA: 0.2 (ref 0.0–1.0)
pH: 6 (ref 5.0–8.0)

## 2014-10-08 LAB — CBC WITH DIFFERENTIAL/PLATELET
Basophils Absolute: 0 10*3/uL (ref 0.0–0.1)
Basophils Relative: 0.8 % (ref 0.0–3.0)
Eosinophils Absolute: 0.4 10*3/uL (ref 0.0–0.7)
Eosinophils Relative: 7.5 % — ABNORMAL HIGH (ref 0.0–5.0)
HCT: 37.2 % (ref 36.0–46.0)
Hemoglobin: 12.2 g/dL (ref 12.0–15.0)
Lymphocytes Relative: 40.8 % (ref 12.0–46.0)
Lymphs Abs: 2.4 10*3/uL (ref 0.7–4.0)
MCHC: 32.8 g/dL (ref 30.0–36.0)
MCV: 84.4 fl (ref 78.0–100.0)
Monocytes Absolute: 0.4 10*3/uL (ref 0.1–1.0)
Monocytes Relative: 6.4 % (ref 3.0–12.0)
Neutro Abs: 2.6 10*3/uL (ref 1.4–7.7)
Neutrophils Relative %: 44.5 % (ref 43.0–77.0)
Platelets: 271 10*3/uL (ref 150.0–400.0)
RBC: 4.41 Mil/uL (ref 3.87–5.11)
RDW: 16.6 % — ABNORMAL HIGH (ref 11.5–15.5)
WBC: 5.9 10*3/uL (ref 4.0–10.5)

## 2014-10-08 LAB — HEPATIC FUNCTION PANEL
ALT: 14 U/L (ref 0–35)
AST: 17 U/L (ref 0–37)
Albumin: 4 g/dL (ref 3.5–5.2)
Alkaline Phosphatase: 53 U/L (ref 39–117)
Bilirubin, Direct: 0.1 mg/dL (ref 0.0–0.3)
Total Bilirubin: 0.4 mg/dL (ref 0.2–1.2)
Total Protein: 7.2 g/dL (ref 6.0–8.3)

## 2014-10-08 LAB — BASIC METABOLIC PANEL
BUN: 9 mg/dL (ref 6–23)
CO2: 24 mEq/L (ref 19–32)
Calcium: 9.2 mg/dL (ref 8.4–10.5)
Chloride: 104 mEq/L (ref 96–112)
Creatinine, Ser: 0.73 mg/dL (ref 0.40–1.20)
GFR: 85.33 mL/min (ref >60.00)
Glucose, Bld: 88 mg/dL (ref 70–99)
Potassium: 3.9 mEq/L (ref 3.5–5.1)
Sodium: 137 mEq/L (ref 135–145)

## 2014-10-08 LAB — VITAMIN B12: Vitamin B-12: 312 pg/mL (ref 211–911)

## 2014-10-08 MED ORDER — CONJ ESTROG-MEDROXYPROGEST ACE 0.45-1.5 MG PO TABS: 1.0000 | ORAL_TABLET | Freq: Every day | ORAL | Status: DC

## 2014-10-08 MED ORDER — PREDNISONE 10 MG PO TABS: ORAL_TABLET | ORAL | Status: DC

## 2014-10-08 NOTE — Patient Instructions (Signed)
Complete lab work prior to leaving.  Restart zyrtec. Start prednisone taper. Call if cough worsens, if you develop fever or if cough does not improve. Schedule complete physical at the front desk.

## 2014-10-08 NOTE — Assessment & Plan Note (Signed)
Not currently on a supplement. Obtain b12 level.

## 2014-10-08 NOTE — Assessment & Plan Note (Signed)
Stable on dexilant, continue same.

## 2014-10-08 NOTE — Assessment & Plan Note (Signed)
Will rx with short course of prednisone, advised pt to resume zyrtec. Continue symbicort, prn albuterol.

## 2014-10-08 NOTE — Progress Notes (Signed)
Subjective:    Patient ID: Brandy Dunlap, female    DOB: 1951/01/04, 64 y.o.   MRN: [id number]  HPI  [name] is a 64 yr old female who presents today to establish care. She was previously followed by a another provider in our group.    GERD- on dexilant. Reports well controlled on dexilant. Reports that nexium had stopped working.   B12 deficiency- not currently on supplement.   HTN- on micardis.   BP Readings from Last 3 Encounters:  10/08/14 120/80  07/19/14 110/82  05/10/14 130/62   Asthma- non-smoker, sees Dr. Fredderick Phenix.  On singulair, symbicort, albuterol prn.  Reports feeling well o nthis regimen.  Notes that she is prone to bronchitis. She is undergoing allergy shots.   Osteoarthritis- s/p R TKA.  L knee feels ok.  Cough-  Reports that cough started several weeks ago and has progressively gotten worse.  Reports feeling tired but does not feel sick. Cough is dry.  She has been on micardis since 2009.  Denies fever.  Does report occasional nasal drainage.  Zyrtec.    Review of Systems See HPI  Past Medical History  Diagnosis Date  . Low back pain   . GERD (gastroesophageal reflux disease)   . Hypertension   . Osteoarthritis   . Sinus trouble   . Colon polyp 10/06/2003  . B12 deficiency   . Bronchitis     hx    Social History   Social History  . Marital Status: Married    Spouse Name: N/A  . Number of Children: 1  . Years of Education: N/A   Occupational History  . teacher    Social History Main Topics  . Smoking status: Never Smoker   . Smokeless tobacco: Never Used  . Alcohol Use: No  . Drug Use: No  . Sexual Activity: Yes   Other Topics Concern  . Not on file   Social History Narrative   Divorced   One daughter- lives locally and one grandson   Retired Pharmacist, hospital,  Has masters degree   Enjoys reading, spending time with her grandson    Past Surgical History  Procedure Laterality Date  . Lumbar fusion  2008  . Cholecystectomy    .  Tonsillectomy    . Partial hip arthroplasty Right 2009     hip replacement  . Colonoscopy w/ biopsies  10/06/2003  . Video bronchoscopy Bilateral 12/05/2012    Procedure: VIDEO BRONCHOSCOPY WITHOUT FLUORO;  Surgeon: Collene Gobble, MD;  Location: [facility];  Service: Cardiopulmonary;  Laterality: Bilateral;  . Back surgery    . Total knee arthroplasty Right 11/16/2013    Procedure: RIGHT TOTAL KNEE ARTHROPLASTY;  Surgeon: Vickey Huger, MD;  Location: [place];  Service: Orthopedics;  Laterality: Right;    Family History  Problem Relation Age of Onset  . COPD Father     died at age 15  . Emphysema Father   . Heart disease Brother     stent with 90%  . COPD Mother   . Heart disease Mother   . Ovarian cancer Mother   . Emphysema Mother     Allergies  Allergen Reactions  . Pantoprazole Sodium Other (See Comments)    Racing heart    Current Outpatient Prescriptions on File Prior to Visit  Medication Sig Dispense Refill  . budesonide-formoterol (SYMBICORT) 160-4.5 MCG/ACT inhaler Inhale 2 puffs into the lungs 2 (two) times daily.    . cetirizine (ZYRTEC) 10 MG tablet  Take 10 mg by mouth at bedtime as needed.  3  . DEXILANT 60 MG capsule TAKE 1 CAPSULE (60 MG TOTAL) BY MOUTH DAILY. 90 capsule 0  . EPIPEN 2-PAK 0.3 MG/0.3ML SOAJ injection   1  . montelukast (SINGULAIR) 10 MG tablet Take 1 tablet (10 mg total) by mouth at bedtime. 90 tablet 1  . Multiple Vitamin (MULTIVITAMIN) capsule Take 1 capsule by mouth daily.      [name] PROAIR RESPICLICK 048 (90 BASE) MCG/ACT AEPB Inhale 2 puffs into the lungs every 6 (six) hours as needed.  0  . telmisartan (MICARDIS) 80 MG tablet TAKE 1 TABLET (80 MG TOTAL) BY MOUTH DAILY. 90 tablet 0   No current facility-administered medications on file prior to visit.    BP 120/80 mmHg  Pulse 85  Temp(Src) 98.6 F (37 C) (Oral)  Ht 5\' 3"  (1.6 m)  Wt 200 lb 8 oz (90.946 kg)  BMI 35.53 kg/m2  SpO2 95%       Objective:   Physical Exam    Constitutional: She is oriented to person, place, and time. She appears well-developed and well-nourished.  Cardiovascular: Normal rate, regular rhythm and normal heart sounds.   No murmur heard. Pulmonary/Chest: Effort normal. No respiratory distress. She has wheezes.  Soft left sided end expiratory wheeze  Musculoskeletal: She exhibits no edema.  Lymphadenopathy:    She has no cervical adenopathy.  Neurological: She is alert and oriented to person, place, and time.  Skin: Skin is warm and dry.  Psychiatric: She has a normal mood and affect. Her behavior is normal. Judgment and thought content normal.          Assessment & Plan:  She is requesting to complete her lab work for her upcoming physical today.

## 2014-10-08 NOTE — Assessment & Plan Note (Signed)
Stable on micardis. Obtain follow up bmet.

## 2014-10-10 ENCOUNTER — Encounter: Payer: Self-pay | Admitting: Family

## 2014-10-13 MED ORDER — SULFAMETHOXAZOLE-TRIMETHOPRIM 800-160 MG PO TABS: 1.0000 | ORAL_TABLET | Freq: Two times a day (BID) | ORAL | Status: DC

## 2014-10-14 ENCOUNTER — Telehealth: Payer: Self-pay | Admitting: Family

## 2014-10-14 NOTE — Telephone Encounter (Signed)
pre visit letter mailed 10/04/14

## 2014-10-22 ENCOUNTER — Telehealth: Payer: Self-pay | Admitting: *Deleted

## 2014-10-22 NOTE — Telephone Encounter (Signed)
Patient declines PVC due to being at a work lunch.

## 2014-10-25 ENCOUNTER — Ambulatory Visit (INDEPENDENT_AMBULATORY_CARE_PROVIDER_SITE_OTHER): Payer: BC Managed Care – PPO | Admitting: Family

## 2014-10-25 ENCOUNTER — Encounter: Payer: Self-pay | Admitting: Family

## 2014-10-25 VITALS — BP 120/82 | HR 83 | Temp 98.0°F | Resp 16 | Ht 63.0 in | Wt 205.1 lb

## 2014-10-25 DIAGNOSIS — Z0000 Encounter for general adult medical examination without abnormal findings: Secondary | ICD-10-CM | POA: Diagnosis not present

## 2014-10-25 DIAGNOSIS — E2839 Other primary ovarian failure: Secondary | ICD-10-CM | POA: Diagnosis not present

## 2014-10-25 DIAGNOSIS — Z01810 Encounter for preprocedural cardiovascular examination: Secondary | ICD-10-CM | POA: Insufficient documentation

## 2014-10-25 NOTE — Progress Notes (Signed)
Pre visit review using our clinic review tool, if applicable. No additional management support is needed unless otherwise documented below in the visit note. 

## 2014-10-25 NOTE — Progress Notes (Signed)
Subjective:    Patient ID: Brandy Dunlap, female    DOB: [birth year], 64 y.o.   MRN: [id number]  HPI  Brandy Dunlap is a 64 yr old female here today for cpx.  Immunizations: up to date Diet: fair diet Exercise: not exercising enough.  Rides bike and walks  Colonoscopy: due- pt will contact [facility]: Had done with Dr. Estanislado Pandy 5 yrs ago. Pap Smear: 2014- normal.  Mammogram: due- pt will schedule.      Review of Systems  Constitutional:       Has gained 5 pounds  HENT: Negative for rhinorrhea.   Respiratory:       Mild cough  Gastrointestinal:       Mild diarrhea x a few days- attributes to heat  Genitourinary: Negative for dysuria and frequency.  Musculoskeletal: Negative for myalgias and arthralgias.  Skin: Negative for rash.  Neurological: Negative for headaches.  Hematological: Negative for adenopathy.  Psychiatric/Behavioral:       Denies depression/anxiety   Past Medical History  Diagnosis Date  . Low back pain   . GERD (gastroesophageal reflux disease)   . Hypertension   . Osteoarthritis   . Sinus trouble   . Colon polyp 10/06/2003  . B12 deficiency   . Bronchitis     hx    Social History   Social History  . Marital Status: Married    Spouse Name: N/A  . Number of Children: 1  . Years of Education: N/A   Occupational History  . teacher    Social History Main Topics  . Smoking status: Never Smoker   . Smokeless tobacco: Never Used  . Alcohol Use: No  . Drug Use: No  . Sexual Activity: Yes   Other Topics Concern  . Not on file   Social History Narrative   Divorced   One daughter- lives locally and one grandson   Retired Pharmacist, hospital,  Has masters degree   Enjoys reading, spending time with her grandson    Past Surgical History  Procedure Laterality Date  . Lumbar fusion  2008  . Cholecystectomy    . Tonsillectomy    . Partial hip arthroplasty Right 2009     hip replacement  . Colonoscopy w/ biopsies  10/06/2003  . Video  bronchoscopy Bilateral 12/05/2012    Procedure: VIDEO BRONCHOSCOPY WITHOUT FLUORO;  Surgeon: Collene Gobble, MD;  Location: [facility];  Service: Cardiopulmonary;  Laterality: Bilateral;  . Back surgery    . Total knee arthroplasty Right 11/16/2013    Procedure: RIGHT TOTAL KNEE ARTHROPLASTY;  Surgeon: Vickey Huger, MD;  Location: [place];  Service: Orthopedics;  Laterality: Right;    Family History  Problem Relation Age of Onset  . COPD Father     died at age 66  . Emphysema Father   . Heart disease Brother     stent with 90%  . COPD Mother   . Heart disease Mother   . Ovarian cancer Mother   . Emphysema Mother     Allergies  Allergen Reactions  . Pantoprazole Sodium Other (See Comments)    Racing heart    Current Outpatient Prescriptions on File Prior to Visit  Medication Sig Dispense Refill  . budesonide-formoterol (SYMBICORT) 160-4.5 MCG/ACT inhaler Inhale 2 puffs into the lungs 2 (two) times daily.    . cetirizine (ZYRTEC) 10 MG tablet Take 10 mg by mouth at bedtime as needed.  3  . DEXILANT 60 MG capsule TAKE 1 CAPSULE (60  MG TOTAL) BY MOUTH DAILY. 90 capsule 0  . EPIPEN 2-PAK 0.3 MG/0.3ML SOAJ injection   1  . estrogen, conjugated,-medroxyprogesterone (PREMPRO) 0.45-1.5 MG per tablet Take 1 tablet by mouth daily. 90 tablet 0  . montelukast (SINGULAIR) 10 MG tablet Take 1 tablet (10 mg total) by mouth at bedtime. 90 tablet 1  . Multiple Vitamin (MULTIVITAMIN) capsule Take 1 capsule by mouth daily.      [name] PROAIR RESPICLICK 790 (90 BASE) MCG/ACT AEPB Inhale 2 puffs into the lungs every 6 (six) hours as needed.  0  . telmisartan (MICARDIS) 80 MG tablet TAKE 1 TABLET (80 MG TOTAL) BY MOUTH DAILY. 90 tablet 0  . predniSONE (DELTASONE) 10 MG tablet 4 tabs by mouth once daily x 2 days, then 3 tabs daily x 2 days, then 2 tabs daily x 2 days, then 1 tab daily x 2 days. (Patient not taking: Reported on 10/25/2014) 20 tablet 0  . sulfamethoxazole-trimethoprim (BACTRIM DS,SEPTRA DS)  800-160 MG per tablet Take 1 tablet by mouth 2 (two) times daily. (Patient not taking: Reported on 10/25/2014) 6 tablet 0   No current facility-administered medications on file prior to visit.    BP 120/82 mmHg  Pulse 83  Temp(Src) 98 F (36.7 C) (Oral)  Resp 16  Ht 5\' 3"  (1.6 m)  Wt 205 lb 2 oz (93.044 kg)  BMI 36.35 kg/m2  SpO2 97%       Objective:   Physical Exam Physical Exam  Constitutional: She is oriented to person, place, and time. She appears well-developed and well-nourished. No distress.  HENT:  Head: Normocephalic and atraumatic.  Right Ear: Tympanic membrane and ear canal normal.  Left Ear: Tympanic membrane and ear canal normal.  Mouth/Throat: Oropharynx is clear and moist.  Eyes: Pupils are equal, round, and reactive to light. No scleral icterus.  Neck: Normal range of motion. No thyromegaly present.  Cardiovascular: Normal rate and regular rhythm.   No murmur heard. Pulmonary/Chest: Effort normal and breath sounds normal. No respiratory distress. He has no wheezes. She has no rales. She exhibits no tenderness.  Abdominal: Soft. Bowel sounds are normal. He exhibits no distension and no mass. There is no tenderness. There is no rebound and no guarding.  Musculoskeletal: She exhibits no edema.  Lymphadenopathy:    She has no cervical adenopathy.  Neurological: She is alert and oriented to person, place, and time. She exhibits normal muscle tone. Coordination normal.  Skin: Skin is warm and dry.  Psychiatric: She has a normal mood and affect. Her behavior is normal. Judgment and thought content normal.  Breasts: Examined lying Right: Without masses, retractions, discharge or axillary adenopathy.  Left: Without masses, retractions, discharge or axillary adenopathy.           Assessment & Plan:          Assessment & Plan:

## 2014-10-25 NOTE — Patient Instructions (Signed)
Please schedule colonoscopy and mammogram. Try setting up a profile on my fitness pal to help log calories and exercise with goal weight loss of 1 pound/week. Schedule bone density on the first floor in imaging.

## 2014-10-25 NOTE — Assessment & Plan Note (Signed)
Immunizations reviewed and up to date.  Discussed healthy diet, exercise, weight loss. Labs reviewed and up to date.

## 2014-11-09 ENCOUNTER — Other Ambulatory Visit: Payer: Self-pay

## 2014-11-09 DIAGNOSIS — Z1231 Encounter for screening mammogram for malignant neoplasm of breast: Secondary | ICD-10-CM

## 2014-11-15 ENCOUNTER — Ambulatory Visit (HOSPITAL_BASED_OUTPATIENT_CLINIC_OR_DEPARTMENT_OTHER)
Admission: RE | Admit: 2014-11-15 | Discharge: 2014-11-15 | Disposition: A | Discharge status: Home / Self Care | Payer: BC Managed Care – PPO | Source: Ambulatory Visit | Attending: Family | Admitting: Family

## 2014-11-15 ENCOUNTER — Encounter: Payer: Self-pay | Admitting: Family

## 2014-11-15 DIAGNOSIS — Z780 Asymptomatic menopausal state: Secondary | ICD-10-CM | POA: Diagnosis not present

## 2014-11-15 DIAGNOSIS — E2839 Other primary ovarian failure: Secondary | ICD-10-CM

## 2014-11-22 ENCOUNTER — Ambulatory Visit
Admission: RE | Admit: 2014-11-22 | Discharge: 2014-11-22 | Disposition: A | Discharge status: Home / Self Care | Payer: BC Managed Care – PPO | Source: Ambulatory Visit

## 2014-11-22 DIAGNOSIS — Z1231 Encounter for screening mammogram for malignant neoplasm of breast: Secondary | ICD-10-CM

## 2014-12-02 ENCOUNTER — Other Ambulatory Visit: Payer: Self-pay | Admitting: Family

## 2014-12-02 ENCOUNTER — Encounter: Payer: Self-pay | Admitting: Family

## 2014-12-03 MED ORDER — DEXLANSOPRAZOLE 60 MG PO CPDR: DELAYED_RELEASE_CAPSULE | ORAL | Status: DC

## 2014-12-03 NOTE — Addendum Note (Signed)
Addended by: Kelle Darting A on: 12/03/2014 03:57 PM   Modules accepted: Orders

## 2014-12-13 ENCOUNTER — Ambulatory Visit: Payer: BC Managed Care – PPO | Admitting: Family

## 2014-12-13 ENCOUNTER — Encounter: Payer: Self-pay | Admitting: Family

## 2014-12-13 ENCOUNTER — Ambulatory Visit (INDEPENDENT_AMBULATORY_CARE_PROVIDER_SITE_OTHER): Payer: BC Managed Care – PPO | Admitting: Family

## 2014-12-13 VITALS — BP 129/70 | HR 84 | Temp 99.1°F | Ht 63.0 in | Wt 206.8 lb

## 2014-12-13 DIAGNOSIS — J019 Acute sinusitis, unspecified: Secondary | ICD-10-CM

## 2014-12-13 MED ORDER — AMOXICILLIN-POT CLAVULANATE 875-125 MG PO TABS: 1.0000 | ORAL_TABLET | Freq: Two times a day (BID) | ORAL | Status: DC

## 2014-12-13 MED ORDER — GUAIFENESIN-CODEINE 100-10 MG/5ML PO SYRP: 5.0000 mL | ORAL_SOLUTION | Freq: Three times a day (TID) | ORAL | Status: DC | PRN

## 2014-12-13 NOTE — Progress Notes (Signed)
Subjective:    Patient ID: Brandy Dunlap, female    DOB: [birth year], 64 y.o.   MRN: [id number]  HPI  Brandy Dunlap is a 64 yr old female who presents today with report of 1 week hx of cough. Reports associated pleuritic chest pain with cough as well as bilateral ear pain and throat pain. She denies known fever at home.  She reports + wheezing.  Energy is poor.   She is taking Zyrtec at night.   Review of Systems     Past Medical History  Diagnosis Date  . Low back pain   . GERD (gastroesophageal reflux disease)   . Hypertension   . Osteoarthritis   . Sinus trouble   . Colon polyp 10/06/2003  . B12 deficiency   . Bronchitis     hx    Social History   Social History  . Marital Status: Married    Spouse Name: N/A  . Number of Children: 1  . Years of Education: N/A   Occupational History  . teacher    Social History Main Topics  . Smoking status: Never Smoker   . Smokeless tobacco: Never Used  . Alcohol Use: No  . Drug Use: No  . Sexual Activity: Yes   Other Topics Concern  . Not on file   Social History Narrative   Divorced   One daughter- lives locally and one grandson   Retired Pharmacist, hospital,  Has masters degree   Enjoys reading, spending time with her grandson    Past Surgical History  Procedure Laterality Date  . Lumbar fusion  2008  . Cholecystectomy    . Tonsillectomy    . Partial hip arthroplasty Right 2009     hip replacement  . Colonoscopy w/ biopsies  10/06/2003  . Video bronchoscopy Bilateral 12/05/2012    Procedure: VIDEO BRONCHOSCOPY WITHOUT FLUORO;  Surgeon: Collene Gobble, MD;  Location: [facility];  Service: Cardiopulmonary;  Laterality: Bilateral;  . Back surgery    . Total knee arthroplasty Right 11/16/2013    Procedure: RIGHT TOTAL KNEE ARTHROPLASTY;  Surgeon: Vickey Huger, MD;  Location: [place];  Service: Orthopedics;  Laterality: Right;    Family History  Problem Relation Age of Onset  . COPD Father     died at age 79  . Emphysema  Father   . Heart disease Brother     stent with 90%  . COPD Mother   . Heart disease Mother   . Ovarian cancer Mother   . Emphysema Mother     Allergies  Allergen Reactions  . Pantoprazole Sodium Other (See Comments)    Racing heart    Current Outpatient Prescriptions on File Prior to Visit  Medication Sig Dispense Refill  . budesonide-formoterol (SYMBICORT) 160-4.5 MCG/ACT inhaler Inhale 2 puffs into the lungs 2 (two) times daily.    . cetirizine (ZYRTEC) 10 MG tablet Take 10 mg by mouth at bedtime as needed.  3  . dexlansoprazole (DEXILANT) 60 MG capsule TAKE 1 CAPSULE (60 MG TOTAL) BY MOUTH DAILY. 90 capsule 1  . EPIPEN 2-PAK 0.3 MG/0.3ML SOAJ injection   1  . estrogen, conjugated,-medroxyprogesterone (PREMPRO) 0.45-1.5 MG per tablet Take 1 tablet by mouth daily. 90 tablet 0  . montelukast (SINGULAIR) 10 MG tablet Take 1 tablet (10 mg total) by mouth at bedtime. 90 tablet 1  . Multiple Vitamin (MULTIVITAMIN) capsule Take 1 capsule by mouth daily.      [name] PROAIR RESPICLICK 122 (90 BASE) MCG/ACT AEPB  Inhale 2 puffs into the lungs every 6 (six) hours as needed.  0  . telmisartan (MICARDIS) 80 MG tablet TAKE 1 TABLET (80 MG TOTAL) BY MOUTH DAILY. 90 tablet 0   No current facility-administered medications on file prior to visit.    BP 129/70 mmHg  Pulse 84  Temp(Src) 99.1 F (37.3 C) (Oral)  Ht 5\' 3"  (1.6 m)  Wt 206 lb 12.8 oz (93.804 kg)  BMI 36.64 kg/m2  SpO2 98%    Objective:   Physical Exam  Constitutional: She is oriented to person, place, and time. She appears well-developed and well-nourished. No distress.  HENT:  Head: Normocephalic and atraumatic.  Right Ear: Tympanic membrane and ear canal normal.  Left Ear: Tympanic membrane and ear canal normal.  Nose: Right sinus exhibits maxillary sinus tenderness and frontal sinus tenderness. Left sinus exhibits maxillary sinus tenderness and frontal sinus tenderness.  Mouth/Throat: No oropharyngeal exudate, posterior  oropharyngeal edema or posterior oropharyngeal erythema.  Difficult to visualize posterior oropharynx due to thick tongue  Eyes: No scleral icterus.  Cardiovascular: Normal rate.   No murmur heard. Pulmonary/Chest: Effort normal and breath sounds normal. No respiratory distress. She has no wheezes. She has no rales. She exhibits no tenderness.  Musculoskeletal: She exhibits no edema.  Lymphadenopathy:    She has no cervical adenopathy.  Neurological: She is alert and oriented to person, place, and time.  Skin: Skin is warm and dry.  Psychiatric: She has a normal mood and affect. Her behavior is normal. Thought content normal.          Assessment & Plan:  Sinusitis with cough- rx with augmentin, add cheratussin prn cough. Call if symptoms worsen or do not improve in 1 week. Pt verbalizes understanding.

## 2014-12-13 NOTE — Progress Notes (Signed)
Pre visit review using our clinic review tool, if applicable. No additional management support is needed unless otherwise documented below in the visit note. 

## 2014-12-13 NOTE — Patient Instructions (Addendum)
Start Augmentin twice daily for sinus infection.  Continue symbicort and proair.   You may use cheratussin as needed for cough (may cause drowsiness). Call if symptoms worsen or if symptoms are not improved in 1 week.

## 2014-12-16 ENCOUNTER — Telehealth: Payer: Self-pay | Admitting: *Deleted

## 2014-12-16 NOTE — Telephone Encounter (Signed)
PA for dexilant 60 mg cap approved through 07/09/2015.

## 2014-12-31 ENCOUNTER — Other Ambulatory Visit: Payer: Self-pay | Admitting: Family

## 2015-01-05 ENCOUNTER — Other Ambulatory Visit: Payer: Self-pay | Admitting: Family

## 2015-02-10 ENCOUNTER — Ambulatory Visit (INDEPENDENT_AMBULATORY_CARE_PROVIDER_SITE_OTHER): Payer: BC Managed Care – PPO | Admitting: Medical

## 2015-02-10 ENCOUNTER — Encounter: Payer: Self-pay | Admitting: Medical

## 2015-02-10 VITALS — BP 126/78 | HR 84 | Temp 98.6°F | Ht 63.0 in | Wt 206.0 lb

## 2015-02-10 DIAGNOSIS — J209 Acute bronchitis, unspecified: Secondary | ICD-10-CM

## 2015-02-10 DIAGNOSIS — R062 Wheezing: Secondary | ICD-10-CM | POA: Diagnosis not present

## 2015-02-10 MED ORDER — PREDNISONE 20 MG PO TABS: ORAL_TABLET | ORAL | Status: DC

## 2015-02-10 MED ORDER — FLUTICASONE PROPIONATE 50 MCG/ACT NA SUSP: 2.0000 | Freq: Every day | NASAL | Status: DC

## 2015-02-10 MED ORDER — HYDROCODONE-HOMATROPINE 5-1.5 MG/5ML PO SYRP: 5.0000 mL | ORAL_SOLUTION | Freq: Three times a day (TID) | ORAL | Status: DC | PRN

## 2015-02-10 MED ORDER — AZITHROMYCIN 250 MG PO TABS: ORAL_TABLET | ORAL | Status: DC

## 2015-02-10 NOTE — Patient Instructions (Addendum)
For probable early bronchitis will rx azithromycin antibiotic.  For your cough, I will rx hycodan cough syrup.   For nasal congestion flonase.  Continue your current inhalers. But if you wheeze/worsening  despite the 3 inhalers then would start 3 day taper dose of prednisone.  Follow up in 7 days or as needed  If not significantly improved by this coming Monday then would recommend cxr (not indicated presenlty). If worsens over weekend then ED evaluation.

## 2015-02-10 NOTE — Progress Notes (Signed)
Pre visit review using our clinic review tool, if applicable. No additional management support is needed unless otherwise documented below in the visit note. 

## 2015-02-10 NOTE — Progress Notes (Signed)
Subjective:    Patient ID: Brandy Dunlap, female    DOB: February 24, 1951, 64 y.o.   MRN: [id number]  HPI  Pt in reporting on Monday mild nasal congestion. Is getting worse with nasal congestion , worse chest congestion, left ear and throat pain.  Dry cough recently.   Pt get bronchitis moderate. Also she developed adult. Pt has been wheezing recently. Pt is on symbicort and spiriva. Pt has proair if she neededs. She sees specialist.  Review of Systems  Constitutional: Negative for fever, chills, diaphoresis and fatigue.  HENT: Positive for congestion, postnasal drip and sore throat. Negative for sinus pressure and sneezing.   Respiratory: Positive for cough and wheezing. Negative for shortness of breath.   Cardiovascular: Negative for chest pain and palpitations.  Gastrointestinal: Negative for abdominal pain.  Musculoskeletal: Negative for back pain.  Neurological: Negative for dizziness and headaches.  Hematological: Negative for adenopathy. Does not bruise/bleed easily.  Psychiatric/Behavioral: Negative for behavioral problems and confusion.    Past Medical History  Diagnosis Date  . Low back pain   . GERD (gastroesophageal reflux disease)   . Hypertension   . Osteoarthritis   . Sinus trouble   . Colon polyp 10/06/2003  . B12 deficiency   . Bronchitis     hx    Social History   Social History  . Marital Status: Married    Spouse Name: N/A  . Number of Children: 1  . Years of Education: N/A   Occupational History  . teacher    Social History Main Topics  . Smoking status: Never Smoker   . Smokeless tobacco: Never Used  . Alcohol Use: No  . Drug Use: No  . Sexual Activity: Yes   Other Topics Concern  . Not on file   Social History Narrative   Divorced   One daughter- lives locally and one grandson   Retired Pharmacist, hospital,  Has masters degree   Enjoys reading, spending time with her grandson    Past Surgical History  Procedure Laterality Date  . Lumbar fusion   2008  . Cholecystectomy    . Tonsillectomy    . Partial hip arthroplasty Right 2009     hip replacement  . Colonoscopy w/ biopsies  10/06/2003  . Video bronchoscopy Bilateral 12/05/2012    Procedure: VIDEO BRONCHOSCOPY WITHOUT FLUORO;  Surgeon: Collene Gobble, MD;  Location: [facility];  Service: Cardiopulmonary;  Laterality: Bilateral;  . Back surgery    . Total knee arthroplasty Right 11/16/2013    Procedure: RIGHT TOTAL KNEE ARTHROPLASTY;  Surgeon: Vickey Huger, MD;  Location: [place];  Service: Orthopedics;  Laterality: Right;    Family History  Problem Relation Age of Onset  . COPD Father     died at age 59  . Emphysema Father   . Heart disease Brother     stent with 90%  . COPD Mother   . Heart disease Mother   . Ovarian cancer Mother   . Emphysema Mother     Allergies  Allergen Reactions  . Pantoprazole Sodium Other (See Comments)    Racing heart    Current Outpatient Prescriptions on File Prior to Visit  Medication Sig Dispense Refill  . budesonide-formoterol (SYMBICORT) 160-4.5 MCG/ACT inhaler Inhale 2 puffs into the lungs 2 (two) times daily.    . cetirizine (ZYRTEC) 10 MG tablet Take 10 mg by mouth at bedtime as needed.  3  . dexlansoprazole (DEXILANT) 60 MG capsule TAKE 1 CAPSULE (60 MG TOTAL)  BY MOUTH DAILY. 90 capsule 1  . EPIPEN 2-PAK 0.3 MG/0.3ML SOAJ injection   1  . guaiFENesin-codeine (CHERATUSSIN AC) 100-10 MG/5ML syrup Take 5 mLs by mouth 3 (three) times daily as needed for cough. 120 mL 0  . montelukast (SINGULAIR) 10 MG tablet Take 1 tablet (10 mg total) by mouth at bedtime. 90 tablet 1  . Multiple Vitamin (MULTIVITAMIN) capsule Take 1 capsule by mouth daily.      [name] PREMPRO 0.45-1.5 MG tablet TAKE 1 TABLET BY MOUTH DAILY. 84 tablet 1  . PROAIR RESPICLICK 123XX123 (90 BASE) MCG/ACT AEPB Inhale 2 puffs into the lungs every 6 (six) hours as needed.  0  . telmisartan (MICARDIS) 80 MG tablet TAKE 1 TABLET (80 MG TOTAL) BY MOUTH DAILY. 90 tablet 0   No current  facility-administered medications on file prior to visit.    BP 126/78 mmHg  Pulse 84  Temp(Src) 98.6 F (37 C) (Oral)  Ht 5\' 3"  (1.6 m)  Wt 206 lb (93.441 kg)  BMI 36.50 kg/m2  SpO2 98%       Objective:   Physical Exam  General  Mental Status - Alert. General Appearance - Well groomed. Not in acute distress.  Skin Rashes- No Rashes.  HEENT Head- Normal. Ear Auditory Canal - Left- Normal. Right - Normal.Tympanic Membrane- Left- Normal. Right- Normal. Eye Sclera/Conjunctiva- Left- Normal. Right- Normal. Nose & Sinuses Nasal Mucosa- Left-  Boggy and Congested. Right-  Boggy and  Congested.Bilateral faint  maxillary pressure but  frontal sinus pressure. Mouth & Throat Lips: Upper Lip- Normal: no dryness, cracking, pallor, cyanosis, or vesicular eruption. Lower Lip-Normal: no dryness, cracking, pallor, cyanosis or vesicular eruption. Buccal Mucosa- Bilateral- No Aphthous ulcers. Oropharynx- No Discharge or Erythema. Tonsils: Characteristics- Bilateral- No Erythema or Congestion. Size/Enlargement- Bilateral- No enlargement. Discharge- bilateral-None.  Neck Neck- Supple. No Masses.   Chest and Lung Exam Auscultation: Breath Sounds:- even and unlabored, but some expiratory wheeze and shallow.  Cardiovascular Auscultation:Rythm- Regular, rate and rhythm. Murmurs & Other Heart Sounds:Ausculatation of the heart reveal- No Murmurs.  Lymphatic Head & Neck General Head & Neck Lymphatics: Bilateral: Description- No Localized lymphadenopathy.       Assessment & Plan:  For probable early bronchitis will rx azithromycin antibiotic.  For your cough, I will rx hycodan cough syrup.   For nasal congestion flonase.  Continue your current inhalers. But if you wheeze/worsening  despite the 3 inhalers then would start 3 day taper dose of prednisone.  Follow up in 7 days or as needed  If not significantly improved by this coming Monday then would recommend cxr (not indicated  presenlty). If worsens over weekend then ED evaluation

## 2015-03-01 DIAGNOSIS — G8929 Other chronic pain: Secondary | ICD-10-CM

## 2015-03-01 DIAGNOSIS — M542 Cervicalgia: Secondary | ICD-10-CM | POA: Insufficient documentation

## 2015-03-01 DIAGNOSIS — M5412 Radiculopathy, cervical region: Secondary | ICD-10-CM | POA: Insufficient documentation

## 2015-03-01 HISTORY — DX: Other chronic pain: G89.29

## 2015-03-01 HISTORY — DX: Cervicalgia: M54.2

## 2015-03-13 ENCOUNTER — Encounter: Payer: Self-pay | Admitting: Family

## 2015-03-14 MED ORDER — AMLODIPINE BESYLATE 5 MG PO TABS: 5.0000 mg | ORAL_TABLET | Freq: Every day | ORAL | Status: DC

## 2015-04-25 ENCOUNTER — Ambulatory Visit (INDEPENDENT_AMBULATORY_CARE_PROVIDER_SITE_OTHER): Payer: BC Managed Care – PPO | Admitting: Family

## 2015-04-25 ENCOUNTER — Encounter: Payer: Self-pay | Admitting: Family

## 2015-04-25 VITALS — BP 134/72 | HR 69 | Temp 98.3°F | Resp 18 | Ht 63.0 in | Wt 207.8 lb

## 2015-04-25 DIAGNOSIS — J45909 Unspecified asthma, uncomplicated: Secondary | ICD-10-CM

## 2015-04-25 DIAGNOSIS — E66811 Obesity, class 1: Secondary | ICD-10-CM | POA: Insufficient documentation

## 2015-04-25 DIAGNOSIS — I10 Essential (primary) hypertension: Secondary | ICD-10-CM | POA: Diagnosis not present

## 2015-04-25 DIAGNOSIS — L659 Nonscarring hair loss, unspecified: Secondary | ICD-10-CM

## 2015-04-25 LAB — CBC WITH DIFFERENTIAL/PLATELET
Basophils Absolute: 0 10*3/uL (ref 0.0–0.1)
Basophils Relative: 0.5 % (ref 0.0–3.0)
Eosinophils Absolute: 0.4 10*3/uL (ref 0.0–0.7)
Eosinophils Relative: 5.8 % — ABNORMAL HIGH (ref 0.0–5.0)
HCT: 38.1 % (ref 36.0–46.0)
Hemoglobin: 12.5 g/dL (ref 12.0–15.0)
Lymphocytes Relative: 38.6 % (ref 12.0–46.0)
Lymphs Abs: 2.7 10*3/uL (ref 0.7–4.0)
MCHC: 32.9 g/dL (ref 30.0–36.0)
MCV: 83 fl (ref 78.0–100.0)
Monocytes Absolute: 0.5 10*3/uL (ref 0.1–1.0)
Monocytes Relative: 6.8 % (ref 3.0–12.0)
Neutro Abs: 3.3 10*3/uL (ref 1.4–7.7)
Neutrophils Relative %: 48.3 % (ref 43.0–77.0)
Platelets: 277 10*3/uL (ref 150.0–400.0)
RBC: 4.59 Mil/uL (ref 3.87–5.11)
RDW: 16.9 % — ABNORMAL HIGH (ref 11.5–15.5)
WBC: 6.9 10*3/uL (ref 4.0–10.5)

## 2015-04-25 LAB — BASIC METABOLIC PANEL
BUN: 11 mg/dL (ref 6–23)
CO2: 25 mEq/L (ref 19–32)
Calcium: 9.2 mg/dL (ref 8.4–10.5)
Chloride: 105 mEq/L (ref 96–112)
Creatinine, Ser: 0.69 mg/dL (ref 0.40–1.20)
GFR: 90.9 mL/min (ref >60.00)
Glucose, Bld: 82 mg/dL (ref 70–99)
Potassium: 3.9 mEq/L (ref 3.5–5.1)
Sodium: 138 mEq/L (ref 135–145)

## 2015-04-25 LAB — TSH: TSH: 1.3 u[IU]/mL (ref 0.35–4.50)

## 2015-04-25 MED ORDER — TIOTROPIUM BROMIDE MONOHYDRATE 2.5 MCG/ACT IN AERS: 2.0000 | INHALATION_SPRAY | Freq: Every day | RESPIRATORY_TRACT | Status: DC

## 2015-04-25 NOTE — Progress Notes (Addendum)
Subjective:    Patient ID: Brandy Dunlap, female    DOB: May 11, 1950, 65 y.o.   MRN: [id number]  HPI   Brandy Dunlap is a 65 yr old female who presents today for follow up.   HTN- currently maintained on amlodipine. Reports improvement in her cough since she came off of micardis. BP Readings from Last 3 Encounters:  04/25/15 134/72  02/10/15 126/78  12/13/14 129/70   Obesity- frustrated with lack of weight loss- reports that she is exercising 5 days a week.  Wt Readings from Last 3 Encounters:  04/25/15 207 lb 12.8 oz (94.257 kg)  02/10/15 206 lb (93.441 kg)  12/13/14 206 lb 12.8 oz (93.804 kg)   Asthma- reports that she saw Dr. Fredderick Phenix her allergist.  She is working with him. She is on spiriva. Seems to be helping. She continues zyrtec and spiriva. No longer on symbicort.   Review of Systems Reports dry skin and her hair is falling out.     Past Medical History  Diagnosis Date  . Low back pain   . GERD (gastroesophageal reflux disease)   . Hypertension   . Osteoarthritis   . Sinus trouble   . Colon polyp 10/06/2003  . B12 deficiency   . Bronchitis     hx    Social History   Social History  . Marital Status: Married    Spouse Name: Brandy Dunlap  . Number of Children: 1  . Years of Education: Brandy Dunlap   Occupational History  . teacher    Social History Main Topics  . Smoking status: Never Smoker   . Smokeless tobacco: Never Used  . Alcohol Use: No  . Drug Use: No  . Sexual Activity: Yes   Other Topics Concern  . Not on file   Social History Narrative   Divorced   One daughter- lives locally and one grandson   Retired Pharmacist, hospital,  Has masters degree   Enjoys reading, spending time with her grandson    Past Surgical History  Procedure Laterality Date  . Lumbar fusion  2008  . Cholecystectomy    . Tonsillectomy    . Partial hip arthroplasty Right 2009     hip replacement  . Colonoscopy w/ biopsies  10/06/2003  . Video bronchoscopy Bilateral 12/05/2012   Procedure: VIDEO BRONCHOSCOPY WITHOUT FLUORO;  Surgeon: Collene Gobble, MD;  Location: [facility];  Service: Cardiopulmonary;  Laterality: Bilateral;  . Back surgery    . Total knee arthroplasty Right 11/16/2013    Procedure: RIGHT TOTAL KNEE ARTHROPLASTY;  Surgeon: Vickey Huger, MD;  Location: [place];  Service: Orthopedics;  Laterality: Right;    Family History  Problem Relation Age of Onset  . COPD Father     died at age 65  . Emphysema Father   . Heart disease Brother     stent with 90%  . COPD Mother   . Heart disease Mother   . Ovarian cancer Mother   . Emphysema Mother     Allergies  Allergen Reactions  . Pantoprazole Sodium Other (See Comments)    Racing heart    Current Outpatient Prescriptions on File Prior to Visit  Medication Sig Dispense Refill  . amLODipine (NORVASC) 5 MG tablet Take 1 tablet (5 mg total) by mouth daily. 90 tablet 1  . cetirizine (ZYRTEC) 10 MG tablet Take 10 mg by mouth at bedtime as needed.  3  . dexlansoprazole (DEXILANT) 60 MG capsule TAKE 1 CAPSULE (60 MG TOTAL) BY MOUTH  DAILY. 90 capsule 1  . EPIPEN 2-PAK 0.3 MG/0.3ML SOAJ injection   1  . montelukast (SINGULAIR) 10 MG tablet Take 1 tablet (10 mg total) by mouth at bedtime. 90 tablet 1  . Multiple Vitamin (MULTIVITAMIN) capsule Take 1 capsule by mouth daily.      [name] PREMPRO 0.45-1.5 MG tablet TAKE 1 TABLET BY MOUTH DAILY. 84 tablet 1  . PROAIR RESPICLICK 123XX123 (90 BASE) MCG/ACT AEPB Inhale 2 puffs into the lungs every 6 (six) hours as needed.  0   No current facility-administered medications on file prior to visit.    BP 134/72 mmHg  Pulse 69  Temp(Src) 98.3 F (36.8 C) (Oral)  Resp 18  Ht 5\' 3"  (1.6 m)  Wt 207 lb 12.8 oz (94.257 kg)  BMI 36.82 kg/m2  SpO2 98%    Objective:   Physical Exam  Constitutional: She is oriented to person, place, and time. She appears well-developed and well-nourished.  Cardiovascular: Normal rate, regular rhythm and normal heart sounds.   No murmur  heard. Pulmonary/Chest: Effort normal and breath sounds normal. No respiratory distress. She has no wheezes.  Musculoskeletal: She exhibits no edema.  Neurological: She is alert and oriented to person, place, and time.  Skin: Skin is warm and dry.  Psychiatric: She has a normal mood and affect. Her behavior is normal. Judgment and thought content normal.          Assessment & Plan:

## 2015-04-25 NOTE — Progress Notes (Signed)
Pre visit review using our clinic review tool, if applicable. No additional management support is needed unless otherwise documented below in the visit note. 

## 2015-04-25 NOTE — Addendum Note (Signed)
Addended by: [name] on: 04/25/2015 02:11 PM   Modules accepted: Miquel Dunn

## 2015-04-25 NOTE — Assessment & Plan Note (Signed)
BP stable on current meds, continue same.  

## 2015-04-25 NOTE — Assessment & Plan Note (Signed)
We discussed importance of counting calories and setting up a plan such as my fitness pal with goal weight loss of 1-2 pounds per week. Encouraged her to continue regular exercise. Her weight is stable per our records.  However I will check TSH due to report of hair loss and dry skin.

## 2015-04-25 NOTE — Assessment & Plan Note (Signed)
Stable, management per allergist.

## 2015-04-25 NOTE — Patient Instructions (Signed)
Please complete lab work prior to leaving. Set up a plan using my fitness pal with a goal weight loss of 1-2 pounds per week.

## 2015-04-26 ENCOUNTER — Encounter: Payer: Self-pay | Admitting: Family

## 2015-05-16 ENCOUNTER — Encounter: Payer: Self-pay | Admitting: Family

## 2015-05-17 ENCOUNTER — Encounter: Payer: Self-pay | Admitting: Family

## 2015-05-17 MED ORDER — MOMETASONE FURO-FORMOTEROL FUM 200-5 MCG/ACT IN AERO: 1.0000 | INHALATION_SPRAY | Freq: Two times a day (BID) | RESPIRATORY_TRACT | Status: DC

## 2015-05-17 MED ORDER — BECLOMETHASONE DIPROPIONATE 80 MCG/ACT NA AERS: INHALATION_SPRAY | NASAL | Status: DC

## 2015-05-23 ENCOUNTER — Encounter: Payer: Self-pay | Admitting: Family

## 2015-05-23 ENCOUNTER — Ambulatory Visit (INDEPENDENT_AMBULATORY_CARE_PROVIDER_SITE_OTHER): Payer: BC Managed Care – PPO | Admitting: Family

## 2015-05-23 VITALS — BP 140/74 | HR 86 | Temp 98.1°F | Resp 16 | Ht 63.0 in | Wt 210.0 lb

## 2015-05-23 DIAGNOSIS — E669 Obesity, unspecified: Secondary | ICD-10-CM

## 2015-05-23 DIAGNOSIS — I10 Essential (primary) hypertension: Secondary | ICD-10-CM

## 2015-05-23 DIAGNOSIS — B999 Unspecified infectious disease: Secondary | ICD-10-CM

## 2015-05-23 DIAGNOSIS — L659 Nonscarring hair loss, unspecified: Secondary | ICD-10-CM

## 2015-05-23 DIAGNOSIS — Z6831 Body mass index (BMI) 31.0-31.9, adult: Secondary | ICD-10-CM

## 2015-05-23 HISTORY — DX: Unspecified infectious disease: B99.9

## 2015-05-23 MED ORDER — NALTREXONE-BUPROPION HCL ER 8-90 MG PO TB12: ORAL_TABLET | ORAL | Status: DC

## 2015-05-23 NOTE — Progress Notes (Signed)
Subjective:    Patient ID: Brandy Dunlap, female    DOB: 1950-07-06, 65 y.o.   MRN: [id number]  HPI  Brandy Dunlap is a 65 yr old female who presents today to discuss obesity and weight loss. She reports that she has increased her exercise and is working on diet.  Reports that she has not lost weight despite these measures.  She also reports that her hair continues to fall out.  Last visit TSH was normal. CBC was also normal.   Patient has some concerns with hair loss and dry skin.  Patient has been very active exercising and changed eating habits but still no weight loss has gained instead.     Wt Readings from Last 3 Encounters:  05/23/15 210 lb (95.255 kg)  04/25/15 207 lb 12.8 oz (94.257 kg)  02/10/15 206 lb (93.441 kg)     Review of Systems See HPI  Past Medical History  Diagnosis Date  . Low back pain   . GERD (gastroesophageal reflux disease)   . Hypertension   . Osteoarthritis   . Sinus trouble   . Colon polyp 10/06/2003  . B12 deficiency   . Bronchitis     hx    Social History   Social History  . Marital Status: Married    Spouse Name: N/A  . Number of Children: 1  . Years of Education: N/A   Occupational History  . teacher    Social History Main Topics  . Smoking status: Never Smoker   . Smokeless tobacco: Never Used  . Alcohol Use: No  . Drug Use: No  . Sexual Activity: Yes   Other Topics Concern  . Not on file   Social History Narrative   Divorced   One daughter- lives locally and one grandson   Retired Pharmacist, hospital,  Has masters degree   Enjoys reading, spending time with her grandson    Past Surgical History  Procedure Laterality Date  . Lumbar fusion  2008  . Cholecystectomy    . Tonsillectomy    . Partial hip arthroplasty Right 2009     hip replacement  . Colonoscopy w/ biopsies  10/06/2003  . Video bronchoscopy Bilateral 12/05/2012    Procedure: VIDEO BRONCHOSCOPY WITHOUT FLUORO;  Surgeon: Collene Gobble, MD;  Location: [facility];   Service: Cardiopulmonary;  Laterality: Bilateral;  . Back surgery    . Total knee arthroplasty Right 11/16/2013    Procedure: RIGHT TOTAL KNEE ARTHROPLASTY;  Surgeon: Vickey Huger, MD;  Location: [place];  Service: Orthopedics;  Laterality: Right;    Family History  Problem Relation Age of Onset  . COPD Father     died at age 74  . Emphysema Father   . Heart disease Brother     stent with 90%  . COPD Mother   . Heart disease Mother   . Ovarian cancer Mother   . Emphysema Mother     Allergies  Allergen Reactions  . Pantoprazole Sodium Other (See Comments)    Racing heart    Current Outpatient Prescriptions on File Prior to Visit  Medication Sig Dispense Refill  . amLODipine (NORVASC) 5 MG tablet Take 1 tablet (5 mg total) by mouth daily. 90 tablet 1  . Beclomethasone Dipropionate (QNASL) 80 MCG/ACT AERS As needed.    . cetirizine (ZYRTEC) 10 MG tablet Take 10 mg by mouth at bedtime as needed.  3  . dexlansoprazole (DEXILANT) 60 MG capsule TAKE 1 CAPSULE (60 MG TOTAL) BY MOUTH  DAILY. 90 capsule 1  . EPIPEN 2-PAK 0.3 MG/0.3ML SOAJ injection   1  . mometasone-formoterol (DULERA) 200-5 MCG/ACT AERO Inhale 1 puff into the lungs 2 (two) times daily.    . montelukast (SINGULAIR) 10 MG tablet Take 1 tablet (10 mg total) by mouth at bedtime. 90 tablet 1  . Multiple Vitamin (MULTIVITAMIN) capsule Take 1 capsule by mouth daily.      [name] PREMPRO 0.45-1.5 MG tablet TAKE 1 TABLET BY MOUTH DAILY. 84 tablet 1  . PROAIR RESPICLICK 123XX123 (90 BASE) MCG/ACT AEPB Inhale 2 puffs into the lungs every 6 (six) hours as needed.  0  . Tiotropium Bromide Monohydrate (SPIRIVA RESPIMAT) 2.5 MCG/ACT AERS Inhale 2 puffs into the lungs daily.     No current facility-administered medications on file prior to visit.    BP 140/74 mmHg  Pulse 86  Temp(Src) 98.1 F (36.7 C) (Oral)  Resp 16  Ht 5\' 3"  (1.6 m)  Wt 210 lb (95.255 kg)  BMI 37.21 kg/m2  SpO2 98%       Objective:   Physical Exam  Constitutional:  She is oriented to person, place, and time. She appears well-developed and well-nourished.  HENT:  Head: Normocephalic and atraumatic.  Musculoskeletal: She exhibits no edema.  Neurological: She is alert and oriented to person, place, and time.  Skin: Skin is warm and dry.  Mild thinning noted on top of head  Psychiatric: She has a normal mood and affect. Her behavior is normal. Judgment and thought content normal.          Assessment & Plan:  Hair loss- Normal lab work.  Monitor.  We did discuss that women can have some pattern baldness.  Monitor.   20 min spent with pt today.  >50% of this time was spent counseling pt on diet/exercise/weight loss.

## 2015-05-23 NOTE — Assessment & Plan Note (Signed)
Discussed calorie counting, continuing exercise.  Rx provided for Contrave. Will obtain UDS today. Follow up in 1 month. Controlled substance contract filled today.

## 2015-05-23 NOTE — Patient Instructions (Signed)
Measure calories.

## 2015-05-23 NOTE — Progress Notes (Signed)
Pre visit review using our clinic review tool, if applicable. No additional management support is needed unless otherwise documented below in the visit note. 

## 2015-05-24 ENCOUNTER — Encounter: Payer: Self-pay | Admitting: Family

## 2015-05-24 NOTE — Telephone Encounter (Signed)
Spoke with pharmacist and received # for [facility], [phone]; pt ID [id number].  Spoke with Calvin and received approval x 4 months. After 4 months of medication, diet and exercise we will have to obtain approval to extend therapy if still needed at that time. Sent mychart message to pt.

## 2015-06-20 ENCOUNTER — Ambulatory Visit: Payer: BC Managed Care – PPO | Admitting: Family

## 2015-06-29 ENCOUNTER — Encounter: Payer: Self-pay | Admitting: Family

## 2015-06-29 ENCOUNTER — Ambulatory Visit (INDEPENDENT_AMBULATORY_CARE_PROVIDER_SITE_OTHER): Payer: BC Managed Care – PPO | Admitting: Family

## 2015-06-29 DIAGNOSIS — R300 Dysuria: Secondary | ICD-10-CM

## 2015-06-29 LAB — POCT URINALYSIS DIPSTICK
Bilirubin, UA: NEGATIVE
Blood, UA: NEGATIVE
Glucose, UA: NEGATIVE
Ketones, UA: NEGATIVE
Leukocytes, UA: NEGATIVE
Nitrite, UA: NEGATIVE
Protein, UA: NEGATIVE
Spec Grav, UA: 1.015
Urobilinogen, UA: NEGATIVE
pH, UA: 6

## 2015-06-29 MED ORDER — NALTREXONE-BUPROPION HCL ER 8-90 MG PO TB12: 2.0000 | ORAL_TABLET | Freq: Two times a day (BID) | ORAL | Status: DC

## 2015-06-29 MED ORDER — DEXLANSOPRAZOLE 60 MG PO CPDR: DELAYED_RELEASE_CAPSULE | ORAL | Status: DC

## 2015-06-29 NOTE — Progress Notes (Signed)
Pre visit review using our clinic review tool, if applicable. No additional management support is needed unless otherwise documented below in the visit note. 

## 2015-06-29 NOTE — Addendum Note (Signed)
Addended by: Kelle Darting A on: 06/29/2015 11:54 AM   Modules accepted: Orders

## 2015-06-29 NOTE — Assessment & Plan Note (Signed)
Commended patient on weight loss. She is working hard on diet, exercise. Tolerating Contrave. Continue same, follow up in 3 months.

## 2015-06-29 NOTE — Progress Notes (Signed)
Subjective:    Patient ID: Brandy Dunlap, female    DOB: 1950/06/10, 65 y.o.   MRN: [id number]  HPI  [name] is a 65 yr old female who presents today for follow up of obesity and weight loss.  Last visit we discussed calorie counting, exercise, and began contrave.  Reports that it is helping her know when she is full. She is watching her diet.  Exercising daily.  Yoga 2x a week, aerobics (low impact 3 times a week).  Wt Readings from Last 3 Encounters:  06/29/15 210 lb (95.255 kg)  05/23/15 210 lb (95.255 kg)  04/25/15 207 lb 12.8 oz (94.257 kg)   She does report some bladder pain/fullness and requests to leave a urine sample today.   Review of Systems See HPI  Past Medical History  Diagnosis Date  . Low back pain   . GERD (gastroesophageal reflux disease)   . Hypertension   . Osteoarthritis   . Sinus trouble   . Colon polyp 10/06/2003  . B12 deficiency   . Bronchitis     hx     Social History   Social History  . Marital Status: Married    Spouse Name: N/A  . Number of Children: 1  . Years of Education: N/A   Occupational History  . teacher    Social History Main Topics  . Smoking status: Never Smoker   . Smokeless tobacco: Never Used  . Alcohol Use: No  . Drug Use: No  . Sexual Activity: Yes   Other Topics Concern  . Not on file   Social History Narrative   Divorced   One daughter- lives locally and one grandson   Retired Pharmacist, hospital,  Has masters degree   Enjoys reading, spending time with her grandson    Past Surgical History  Procedure Laterality Date  . Lumbar fusion  2008  . Cholecystectomy    . Tonsillectomy    . Partial hip arthroplasty Right 2009     hip replacement  . Colonoscopy w/ biopsies  10/06/2003  . Video bronchoscopy Bilateral 12/05/2012    Procedure: VIDEO BRONCHOSCOPY WITHOUT FLUORO;  Surgeon: Collene Gobble, MD;  Location: [facility];  Service: Cardiopulmonary;  Laterality: Bilateral;  . Back surgery    . Total knee  arthroplasty Right 11/16/2013    Procedure: RIGHT TOTAL KNEE ARTHROPLASTY;  Surgeon: Vickey Huger, MD;  Location: [place];  Service: Orthopedics;  Laterality: Right;    Family History  Problem Relation Age of Onset  . COPD Father     died at age 51  . Emphysema Father   . Heart disease Brother     stent with 90%  . COPD Mother   . Heart disease Mother   . Ovarian cancer Mother   . Emphysema Mother     Allergies  Allergen Reactions  . Pantoprazole Sodium Other (See Comments)    Racing heart    Current Outpatient Prescriptions on File Prior to Visit  Medication Sig Dispense Refill  . amLODipine (NORVASC) 5 MG tablet Take 1 tablet (5 mg total) by mouth daily. 90 tablet 1  . Beclomethasone Dipropionate (QNASL) 80 MCG/ACT AERS As needed.    . cetirizine (ZYRTEC) 10 MG tablet Take 10 mg by mouth at bedtime as needed.  3  . dexlansoprazole (DEXILANT) 60 MG capsule TAKE 1 CAPSULE (60 MG TOTAL) BY MOUTH DAILY. 90 capsule 1  . EPIPEN 2-PAK 0.3 MG/0.3ML SOAJ injection   1  . mometasone-formoterol (DULERA)  200-5 MCG/ACT AERO Inhale 1 puff into the lungs 2 (two) times daily.    . montelukast (SINGULAIR) 10 MG tablet Take 1 tablet (10 mg total) by mouth at bedtime. 90 tablet 1  . Multiple Vitamin (MULTIVITAMIN) capsule Take 1 capsule by mouth daily.      . Naltrexone-Bupropion HCl ER (CONTRAVE) 8-90 MG TB12 One tablet (naltrexone 8 mg/bupropion 90 mg) once daily in the morning for 1 week; at week 2, increase to 1 tablet twice daily administered in the morning and evening and continue for 1 week; at week 3, increase to 2 tablets in the morning and 1 tablet in the evening and continue for 1 week; at week 4, increase to 2 tablets twice daily administered in the morning and evening and continue for the remainder of the treatment course. 70 tablet 0  . omalizumab (XOLAIR) 150 MG injection Inject 150 mg into the skin every 14 (fourteen) days.    [name] PREMPRO 0.45-1.5 MG tablet TAKE 1 TABLET BY MOUTH DAILY.  84 tablet 1  . PROAIR RESPICLICK 123XX123 (90 BASE) MCG/ACT AEPB Inhale 2 puffs into the lungs every 6 (six) hours as needed.  0   No current facility-administered medications on file prior to visit.    BP 138/84 mmHg  Pulse 66  Temp(Src) 98.2 F (36.8 C) (Oral)  Resp 16  Ht 5\' 3"  (1.6 m)  Wt 210 lb (95.255 kg)  BMI 37.21 kg/m2  SpO2 99%       Objective:   Physical Exam  Constitutional: She is oriented to person, place, and time. She appears well-developed and well-nourished. No distress.  Neurological: She is alert and oriented to person, place, and time.  Psychiatric: She has a normal mood and affect. Her behavior is normal. Judgment and thought content normal.          Assessment & Plan:  Dysuria- UA normal. Will send for culture to exclude infection.

## 2015-06-29 NOTE — Patient Instructions (Signed)
Please continue Contrave.

## 2015-06-29 NOTE — Addendum Note (Signed)
Addended by: Kelle Darting A on: 06/29/2015 12:14 PM   Modules accepted: Orders

## 2015-07-02 LAB — URINE CULTURE: Colony Count: 50000

## 2015-07-04 ENCOUNTER — Encounter: Payer: Self-pay | Admitting: Family

## 2015-07-04 ENCOUNTER — Telehealth: Payer: Self-pay | Admitting: Family

## 2015-07-04 MED ORDER — SULFAMETHOXAZOLE-TRIMETHOPRIM 400-80 MG PO TABS: 1.0000 | ORAL_TABLET | Freq: Two times a day (BID) | ORAL | Status: AC

## 2015-07-04 NOTE — Telephone Encounter (Signed)
Urine grew some bacteria. Pt should begin bactrim x 3 days.

## 2015-07-04 NOTE — Telephone Encounter (Signed)
Attempted to reach pt and left message to check mychart. Message sent. 

## 2015-08-01 ENCOUNTER — Encounter: Payer: Self-pay | Admitting: Family

## 2015-08-01 MED ORDER — NALTREXONE-BUPROPION HCL ER 8-90 MG PO TB12: 2.0000 | ORAL_TABLET | Freq: Two times a day (BID) | ORAL | Status: DC

## 2015-08-01 NOTE — Telephone Encounter (Signed)
Last Rx 06/29/15, #120. Pt last seen 06/29/15 and due for 3 month f/u on 09/29/15.  Rx printed and forwarded to PCP for signature.

## 2015-08-09 ENCOUNTER — Encounter: Payer: Self-pay | Admitting: Family

## 2015-08-22 ENCOUNTER — Encounter: Payer: Self-pay | Admitting: Family

## 2015-08-22 ENCOUNTER — Ambulatory Visit (INDEPENDENT_AMBULATORY_CARE_PROVIDER_SITE_OTHER): Payer: BC Managed Care – PPO | Admitting: Family

## 2015-08-22 VITALS — BP 130/80 | HR 85 | Temp 98.5°F | Resp 16 | Ht 63.0 in

## 2015-08-22 DIAGNOSIS — H6092 Unspecified otitis externa, left ear: Secondary | ICD-10-CM | POA: Diagnosis not present

## 2015-08-22 MED ORDER — NEOMYCIN-POLYMYXIN-HC 3.5-10000-1 OT SUSP: 3.0000 [drp] | Freq: Four times a day (QID) | OTIC | Status: AC

## 2015-08-22 NOTE — Patient Instructions (Signed)
Begin cortisporin otic drops 4 times a day to left ear for 1 week. For pain you may use tylenol as needed. Call if symptoms worsen or if not improved in 1 week.

## 2015-08-22 NOTE — Progress Notes (Signed)
Subjective:    Patient ID: Brandy Dunlap, female    DOB: Sep 17, 1950, 65 y.o.   MRN: [id number]  HPI  Brandy Dunlap is a 65 yr old female who presents today with chief complaint of otalgia left ear. Has been present x 1 week. Was told that she has wax. Has been doing earwax softening drops an flushing left ear but still having pain.   Asthma- reports that she is following with Dr. Harold Dunlap (Allergist) and is now on xolair. Has had a + cough recently, denies fever.   Review of Systems See HPI  Past Medical History  Diagnosis Date  . Low back pain   . GERD (gastroesophageal reflux disease)   . Hypertension   . Osteoarthritis   . Sinus trouble   . Colon polyp 10/06/2003  . B12 deficiency   . Bronchitis     hx     Social History   Social History  . Marital Status: Married    Spouse Name: N/A  . Number of Children: 1  . Years of Education: N/A   Occupational History  . teacher    Social History Main Topics  . Smoking status: Never Smoker   . Smokeless tobacco: Never Used  . Alcohol Use: No  . Drug Use: No  . Sexual Activity: Yes   Other Topics Concern  . Not on file   Social History Narrative   Divorced   One daughter- lives locally and one grandson   Retired Pharmacist, hospital,  Has masters degree   Enjoys reading, spending time with her grandson    Past Surgical History  Procedure Laterality Date  . Lumbar fusion  2008  . Cholecystectomy    . Tonsillectomy    . Partial hip arthroplasty Right 2009     hip replacement  . Colonoscopy w/ biopsies  10/06/2003  . Video bronchoscopy Bilateral 12/05/2012    Procedure: VIDEO BRONCHOSCOPY WITHOUT FLUORO;  Surgeon: Collene Gobble, MD;  Location: [facility];  Service: Cardiopulmonary;  Laterality: Bilateral;  . Back surgery    . Total knee arthroplasty Right 11/16/2013    Procedure: RIGHT TOTAL KNEE ARTHROPLASTY;  Surgeon: Vickey Huger, MD;  Location: [place];  Service: Orthopedics;  Laterality: Right;    Family History    Problem Relation Age of Onset  . COPD Father     died at age 10  . Emphysema Father   . Heart disease Brother     stent with 90%  . COPD Mother   . Heart disease Mother   . Ovarian cancer Mother   . Emphysema Mother     Allergies  Allergen Reactions  . Pantoprazole Sodium Other (See Comments)    Racing heart    Current Outpatient Prescriptions on File Prior to Visit  Medication Sig Dispense Refill  . amLODipine (NORVASC) 5 MG tablet Take 1 tablet (5 mg total) by mouth daily. 90 tablet 1  . cetirizine (ZYRTEC) 10 MG tablet Take 10 mg by mouth at bedtime as needed.  3  . dexlansoprazole (DEXILANT) 60 MG capsule TAKE 1 CAPSULE (60 MG TOTAL) BY MOUTH DAILY. 90 capsule 1  . EPIPEN 2-PAK 0.3 MG/0.3ML SOAJ injection   1  . mometasone-formoterol (DULERA) 200-5 MCG/ACT AERO Inhale 1 puff into the lungs 2 (two) times daily.    . Multiple Vitamin (MULTIVITAMIN) capsule Take 1 capsule by mouth daily.      . Naltrexone-Bupropion HCl ER (CONTRAVE) 8-90 MG TB12 Take 2 tablets by mouth 2 (  two) times daily. 120 tablet 0  . omalizumab (XOLAIR) 150 MG injection Inject 150 mg into the skin every 14 (fourteen) days.    [name] PROAIR RESPICLICK 123XX123 (90 BASE) MCG/ACT AEPB Inhale 2 puffs into the lungs every 6 (six) hours as needed.  0   No current facility-administered medications on file prior to visit.    BP 130/80 mmHg  Pulse 85  Temp(Src) 98.5 F (36.9 C) (Oral)  Resp 16  Ht 5\' 3"  (1.6 m)  Wt   SpO2 97%       Objective:   Physical Exam  Constitutional: She appears well-developed and well-nourished.  HENT:  + cerumen left TM. After irrigation, cerumen was removed and normal L TM is revealed.  L ear canal is mildly erythematous  Cardiovascular: Normal rate, regular rhythm and normal heart sounds.   No murmur heard. Pulmonary/Chest: Effort normal. No respiratory distress.  Soft left sided expiratory wheeze noted.   Psychiatric: She has a normal mood and affect. Her behavior is normal.  Judgment and thought content normal.          Assessment & Plan:  L otitis externa- L ear flushed with warm water. Cerumen plug removed with irrigation. Pt tolerated well. Will rx with cortisporin otic.    Asthma- mild wheeze noted.  Afebrile- advised pt to continue to use her rescue inhaler as well as dulera.  Call if symptoms worsen or do not improve.

## 2015-08-22 NOTE — Progress Notes (Signed)
Pre visit review using our clinic review tool, if applicable. No additional management support is needed unless otherwise documented below in the visit note. 

## 2015-08-24 ENCOUNTER — Encounter: Payer: Self-pay | Admitting: Family

## 2015-09-01 ENCOUNTER — Encounter: Payer: Self-pay | Admitting: Family

## 2015-09-01 MED ORDER — AMLODIPINE BESYLATE 5 MG PO TABS: 5.0000 mg | ORAL_TABLET | Freq: Every day | ORAL | Status: DC

## 2015-09-01 NOTE — Telephone Encounter (Signed)
Amlodipine Rx sent

## 2015-09-18 ENCOUNTER — Other Ambulatory Visit: Payer: Self-pay | Admitting: Family

## 2015-09-18 ENCOUNTER — Encounter: Payer: Self-pay | Admitting: Family

## 2015-09-19 MED ORDER — NALTREXONE-BUPROPION HCL ER 8-90 MG PO TB12: 2.0000 | ORAL_TABLET | Freq: Two times a day (BID) | ORAL | 0 refills | Status: DC

## 2015-09-19 NOTE — Telephone Encounter (Signed)
Pt has follow up 10/24/15. Dexilant should still have 1 refill remaining from 06/29/15 Rx. Contrave last filled 08/01/15, #120. Contrave Rx sent. Message sent to pt.

## 2015-09-20 ENCOUNTER — Encounter: Payer: Self-pay | Admitting: Family

## 2015-09-20 NOTE — Telephone Encounter (Signed)
Spoke with Vincente Liberty at [employer], [phone]; pt ID [id number]. Obtained approval  For Contrave for 3 yrs (09/20/18) and next fill date with be 10/12/15. States they are showing a paid claim for 09/20/15. Spoke with Lanelle Bal at [facility] and they have Contrave Rx ready for pick up. Message sent to pt.

## 2015-09-21 NOTE — Telephone Encounter (Signed)
Received PA approval letter for Contrave. Effective: 09/20/2015 through 09/20/2018. PA approval sent for scanning.

## 2015-09-23 DIAGNOSIS — M47812 Spondylosis without myelopathy or radiculopathy, cervical region: Secondary | ICD-10-CM | POA: Insufficient documentation

## 2015-10-24 ENCOUNTER — Other Ambulatory Visit (HOSPITAL_COMMUNITY)
Admission: RE | Admit: 2015-10-24 | Discharge: 2015-10-24 | Disposition: A | Discharge status: Home / Self Care | Payer: BC Managed Care – PPO | Source: Ambulatory Visit | Attending: Family | Admitting: Family

## 2015-10-24 ENCOUNTER — Ambulatory Visit (INDEPENDENT_AMBULATORY_CARE_PROVIDER_SITE_OTHER): Payer: BC Managed Care – PPO | Admitting: Family

## 2015-10-24 ENCOUNTER — Encounter: Payer: Self-pay | Admitting: Family

## 2015-10-24 VITALS — BP 133/72 | HR 77 | Temp 98.5°F | Resp 16 | Ht 63.0 in | Wt 189.5 lb

## 2015-10-24 DIAGNOSIS — R350 Frequency of micturition: Secondary | ICD-10-CM

## 2015-10-24 DIAGNOSIS — Z0000 Encounter for general adult medical examination without abnormal findings: Secondary | ICD-10-CM | POA: Diagnosis not present

## 2015-10-24 DIAGNOSIS — N760 Acute vaginitis: Secondary | ICD-10-CM | POA: Insufficient documentation

## 2015-10-24 DIAGNOSIS — N898 Other specified noninflammatory disorders of vagina: Secondary | ICD-10-CM | POA: Diagnosis not present

## 2015-10-24 DIAGNOSIS — Z01419 Encounter for gynecological examination (general) (routine) without abnormal findings: Secondary | ICD-10-CM | POA: Diagnosis not present

## 2015-10-24 LAB — HEPATIC FUNCTION PANEL
ALT: 30 U/L (ref 0–35)
AST: 25 U/L (ref 0–37)
Albumin: 4 g/dL (ref 3.5–5.2)
Alkaline Phosphatase: 63 U/L (ref 39–117)
Bilirubin, Direct: 0 mg/dL (ref 0.0–0.3)
Total Bilirubin: 0.4 mg/dL (ref 0.2–1.2)
Total Protein: 6.9 g/dL (ref 6.0–8.3)

## 2015-10-24 LAB — CBC WITH DIFFERENTIAL/PLATELET
Basophils Absolute: 0 10*3/uL (ref 0.0–0.1)
Basophils Relative: 0.7 % (ref 0.0–3.0)
Eosinophils Absolute: 0.4 10*3/uL (ref 0.0–0.7)
Eosinophils Relative: 6.1 % — ABNORMAL HIGH (ref 0.0–5.0)
HCT: 37.3 % (ref 36.0–46.0)
Hemoglobin: 12.4 g/dL (ref 12.0–15.0)
Lymphocytes Relative: 43.3 % (ref 12.0–46.0)
Lymphs Abs: 2.9 10*3/uL (ref 0.7–4.0)
MCHC: 33.4 g/dL (ref 30.0–36.0)
MCV: 83.1 fl (ref 78.0–100.0)
Monocytes Absolute: 0.4 10*3/uL (ref 0.1–1.0)
Monocytes Relative: 5.9 % (ref 3.0–12.0)
Neutro Abs: 2.9 10*3/uL (ref 1.4–7.7)
Neutrophils Relative %: 44 % (ref 43.0–77.0)
Platelets: 266 10*3/uL (ref 150.0–400.0)
RBC: 4.48 Mil/uL (ref 3.87–5.11)
RDW: 16.4 % — ABNORMAL HIGH (ref 11.5–15.5)
WBC: 6.6 10*3/uL (ref 4.0–10.5)

## 2015-10-24 LAB — URINALYSIS, ROUTINE W REFLEX MICROSCOPIC
Bilirubin Urine: NEGATIVE
Ketones, ur: NEGATIVE
Nitrite: NEGATIVE
Specific Gravity, Urine: 1.02 (ref 1.000–1.030)
Total Protein, Urine: NEGATIVE
Urine Glucose: NEGATIVE
Urobilinogen, UA: 0.2 (ref 0.0–1.0)
pH: 6 (ref 5.0–8.0)

## 2015-10-24 LAB — LIPID PANEL
Cholesterol: 187 mg/dL (ref 0–200)
HDL: 56.6 mg/dL (ref >39.00)
LDL Cholesterol: 109 mg/dL — ABNORMAL HIGH (ref 0–99)
NonHDL: 130.68
Total CHOL/HDL Ratio: 3
Triglycerides: 108 mg/dL (ref 0.0–149.0)
VLDL: 21.6 mg/dL (ref 0.0–40.0)

## 2015-10-24 LAB — BASIC METABOLIC PANEL
BUN: 12 mg/dL (ref 6–23)
CO2: 27 mEq/L (ref 19–32)
Calcium: 9 mg/dL (ref 8.4–10.5)
Chloride: 104 mEq/L (ref 96–112)
Creatinine, Ser: 0.74 mg/dL (ref 0.40–1.20)
GFR: 83.72 mL/min (ref >60.00)
Glucose, Bld: 95 mg/dL (ref 70–99)
Potassium: 4 mEq/L (ref 3.5–5.1)
Sodium: 139 mEq/L (ref 135–145)

## 2015-10-24 LAB — TSH: TSH: 1.15 u[IU]/mL (ref 0.35–4.50)

## 2015-10-24 MED ORDER — MOMETASONE FUROATE 50 MCG/ACT NA SUSP: 2.0000 | Freq: Every day | NASAL | 12 refills | Status: DC

## 2015-10-24 MED ORDER — NALTREXONE-BUPROPION HCL ER 8-90 MG PO TB12: 2.0000 | ORAL_TABLET | Freq: Two times a day (BID) | ORAL | 0 refills | Status: DC

## 2015-10-24 NOTE — Patient Instructions (Signed)
For cough and left ear pain- please continue nasonex, zyrtec, and allergy shots. Let me know if ear pain worsens and we can set you up to see ENT. Let me know if cough worsens or does not improve and we can set you up with pulmonology.  Also, please let us know if you develop fever.  Complete lab work prior to leaving.

## 2015-10-24 NOTE — Progress Notes (Signed)
Pre visit review using our clinic review tool, if applicable. No additional management support is needed unless otherwise documented below in the visit note. 

## 2015-10-24 NOTE — Progress Notes (Signed)
Subjective:    Patient ID: Brandy Dunlap, female    DOB: 11/09/1950, 65 y.o.   MRN: [id number]  HPI  Brandy Dunlap is a 65 year old female who presents today for cpx.    Immunizations: declines prevnar today, declines flu shot Diet:  healthy Exercise: every day, yoga, low impact aerobics Colonoscopy: 2005 Dexa: 9/16- normal Pap Smear: 2014 normal Mammogram: due 11/24/15  Left ear pain- reports some weeks it is fine. However she has intermittent aching, "like a headache."   Review of Systems  Constitutional: Negative for unexpected weight change.  HENT: Negative for rhinorrhea.   Respiratory: Positive for cough.        Has had a cough for the last few weeks.    Cardiovascular: Negative for leg swelling.  Gastrointestinal: Negative for blood in stool, constipation and diarrhea.  Genitourinary: Positive for frequency. Negative for dysuria.       + pelvic pressure  Musculoskeletal: Negative for arthralgias and myalgias.  Neurological: Negative for headaches.  Hematological: Negative for adenopathy.  Psychiatric/Behavioral:       Denies depression/anxiety   Past Medical History:  Diagnosis Date  . B12 deficiency   . Bronchitis    hx  . Colon polyp 10/06/2003  . GERD (gastroesophageal reflux disease)   . Hypertension   . Low back pain   . Osteoarthritis   . Sinus trouble      Social History   Social History  . Marital status: Married    Spouse name: N/A  . Number of children: 1  . Years of education: N/A   Occupational History  . teacher [employer]   Social History Main Topics  . Smoking status: Never Smoker  . Smokeless tobacco: Never Used  . Alcohol use No  . Drug use: No  . Sexual activity: Yes   Other Topics Concern  . Not on file   Social History Narrative   Divorced   One daughter- lives locally and one grandson   Retired Pharmacist, hospital,  Has masters degree   Enjoys reading, spending time with her grandson    Past Surgical History:    Procedure Laterality Date  . BACK SURGERY    . CHOLECYSTECTOMY    . COLONOSCOPY W/ BIOPSIES  10/06/2003  . LUMBAR FUSION  2008  . PARTIAL HIP ARTHROPLASTY Right 2009    hip replacement  . TONSILLECTOMY    . TOTAL KNEE ARTHROPLASTY Right 11/16/2013   Procedure: RIGHT TOTAL KNEE ARTHROPLASTY;  Surgeon: Vickey Huger, MD;  Location: [place];  Service: Orthopedics;  Laterality: Right;  [name] VIDEO BRONCHOSCOPY Bilateral 12/05/2012   Procedure: VIDEO BRONCHOSCOPY WITHOUT FLUORO;  Surgeon: Collene Gobble, MD;  Location: [facility];  Service: Cardiopulmonary;  Laterality: Bilateral;    Family History  Problem Relation Age of Onset  . COPD Father     died at age 72  . Emphysema Father   . Heart disease Brother     stent with 90%  . COPD Mother   . Heart disease Mother   . Ovarian cancer Mother   . Emphysema Mother     Allergies  Allergen Reactions  . Pantoprazole Sodium Other (See Comments)    Racing heart    Current Outpatient Prescriptions on File Prior to Visit  Medication Sig Dispense Refill  . amLODipine (NORVASC) 5 MG tablet Take 1 tablet (5 mg total) by mouth daily. 90 tablet 1  . cetirizine (ZYRTEC) 10 MG tablet Take 10 mg by mouth  at bedtime as needed.  3  . dexlansoprazole (DEXILANT) 60 MG capsule TAKE 1 CAPSULE (60 MG TOTAL) BY MOUTH DAILY. 90 capsule 1  . EPIPEN 2-PAK 0.3 MG/0.3ML SOAJ injection   1  . mometasone-formoterol (DULERA) 200-5 MCG/ACT AERO Inhale 1 puff into the lungs 2 (two) times daily.    . Multiple Vitamin (MULTIVITAMIN) capsule Take 1 capsule by mouth daily.      . Naltrexone-Bupropion HCl ER (CONTRAVE) 8-90 MG TB12 Take 2 tablets by mouth 2 (two) times daily. 120 tablet 0  . omalizumab (XOLAIR) 150 MG injection Inject 150 mg into the skin every 14 (fourteen) days.    [name] PROAIR RESPICLICK 123XX123 (90 BASE) MCG/ACT AEPB Inhale 2 puffs into the lungs every 6 (six) hours as needed.  0   No current facility-administered medications on file prior to visit.     BP  133/72 (BP Location: Right Arm, Patient Position: Sitting, Cuff Size: Normal)   Pulse 77   Temp 98.5 F (36.9 C) (Oral)   Resp 16   Ht 5\' 3"  (1.6 m)   Wt 189 lb 8 oz (86 kg)   SpO2 96%   BMI 33.57 kg/m       Objective:   Physical Exam  Physical Exam  Constitutional: She is oriented to person, place, and time. She appears well-developed and well-nourished. No distress.  HENT:  Head: Normocephalic and atraumatic.  Right Ear: Tympanic membrane and ear canal normal.  Left Ear: Tympanic membrane and ear canal normal.  Mouth/Throat: Oropharynx is clear and moist.  Eyes: Pupils are equal, round, and reactive to light. No scleral icterus.  Neck: Normal range of motion. No thyromegaly present.  Cardiovascular: Normal rate and regular rhythm.   No murmur heard. Pulmonary/Chest: Effort normal and breath sounds normal. No respiratory distress. He has no wheezes. She has no rales. She exhibits no tenderness.  Abdominal: Soft. Bowel sounds are normal. She exhibits no distension and no mass. There is no tenderness. There is no rebound and no guarding.  Musculoskeletal: She exhibits no edema.  Lymphadenopathy:    She has no cervical adenopathy.  Neurological: She is alert and oriented to person, place, and time. She has normal patellar reflexes. She exhibits normal muscle tone. Coordination normal.  Skin: Skin is warm and dry.  Psychiatric: She has a normal mood and affect. Her behavior is normal. Judgment and thought content normal.  Breasts: Examined lying Right: Without masses, retractions, discharge or axillary adenopathy.  Left: Without masses, retractions, discharge or axillary adenopathy.  Inguinal/mons: Normal without inguinal adenopathy  External genitalia: Normal  BUS/Urethra/Skene's glands: Normal  Bladder: Normal  Vagina: Normal  Cervix: Normal  Uterus: normal in size, shape and contour. Midline and mobile  Adnexa/parametria:  Rt: Without masses or tenderness.  Lt: Without  masses or tenderness.  Anus and perineum: Normal           Assessment & Plan:   Preventative care- declines flu and prevnar today. Agreeable to prevnar after age 89. Difficult pap due to patient discomfort during pap.  Would plan referral to GYN for future paps if necessary.   L ear pain- suspect eustachian tube dysfunction. Advised pt to continue nasonex, zyrtec, let me know if symptoms worsen or do not improve and we can refer to ENT.  Cough- suspect allergy related- continue PPI, zyrtec, xolair/proair, dulara, allergy shots, let me know if symptoms worsen or do not improve, will consider referral to pulmonology at that time.  Urinary frequency- will obtain  UA and culture, to rule out UTI.  Vaginal odor- send pap specimen for ancillary testing (garnerella/yeast).        Assessment & Plan:

## 2015-10-25 LAB — URINE CULTURE

## 2015-10-25 LAB — CYTOLOGY - PAP

## 2015-10-26 ENCOUNTER — Telehealth: Payer: Self-pay | Admitting: Family

## 2015-10-26 MED ORDER — CEPHALEXIN 500 MG PO CAPS: 500.0000 mg | ORAL_CAPSULE | Freq: Four times a day (QID) | ORAL | 0 refills | Status: AC

## 2015-10-26 NOTE — Telephone Encounter (Signed)
Attempted to reach pt via phone. Call was disconnected, did not go through. Sent message via mychart.

## 2015-10-26 NOTE — Telephone Encounter (Signed)
rx shows mild UTI. Begin keflex, call if symptoms worsen or do not improve. Other lab work looks good.

## 2015-10-27 LAB — CERVICOVAGINAL ANCILLARY ONLY
Bacterial vaginitis: NEGATIVE
Candida vaginitis: NEGATIVE

## 2015-11-24 ENCOUNTER — Encounter: Payer: Self-pay | Admitting: Internal Medicine

## 2015-11-24 ENCOUNTER — Encounter: Payer: Self-pay | Admitting: Family

## 2015-12-08 ENCOUNTER — Other Ambulatory Visit: Payer: Self-pay | Admitting: Family

## 2015-12-20 ENCOUNTER — Encounter: Payer: Self-pay | Admitting: Family

## 2016-01-23 ENCOUNTER — Encounter: Payer: BC Managed Care – PPO | Admitting: Internal Medicine

## 2016-02-14 ENCOUNTER — Ambulatory Visit (INDEPENDENT_AMBULATORY_CARE_PROVIDER_SITE_OTHER): Payer: Medicare Other | Admitting: Family

## 2016-02-14 ENCOUNTER — Ambulatory Visit: Payer: Self-pay | Admitting: Family

## 2016-02-14 ENCOUNTER — Encounter: Payer: Self-pay | Admitting: Family

## 2016-02-14 VITALS — BP 149/67 | HR 72 | Temp 98.1°F | Ht 63.0 in

## 2016-02-14 DIAGNOSIS — J069 Acute upper respiratory infection, unspecified: Secondary | ICD-10-CM | POA: Diagnosis not present

## 2016-02-14 DIAGNOSIS — J029 Acute pharyngitis, unspecified: Secondary | ICD-10-CM | POA: Diagnosis not present

## 2016-02-14 DIAGNOSIS — H65197 Other acute nonsuppurative otitis media recurrent, unspecified ear: Secondary | ICD-10-CM

## 2016-02-14 DIAGNOSIS — B9789 Other viral agents as the cause of diseases classified elsewhere: Secondary | ICD-10-CM

## 2016-02-14 LAB — POCT RAPID STREP A (OFFICE): Rapid Strep A Screen: NEGATIVE

## 2016-02-14 MED ORDER — BENZONATATE 100 MG PO CAPS: 100.0000 mg | ORAL_CAPSULE | Freq: Three times a day (TID) | ORAL | 0 refills | Status: DC | PRN

## 2016-02-14 NOTE — Patient Instructions (Signed)
You may use tessalon as needed for cough. For congestion you may use mucinex as needed. You may also use nasal saline spray. You may use proair as needed for wheezing/tightness. Call if new/worsening symptoms or if not improved in 3 days.

## 2016-02-14 NOTE — Progress Notes (Signed)
Pre visit review using our clinic review tool, if applicable. No additional management support is needed unless otherwise documented below in the visit note. 

## 2016-02-14 NOTE — Progress Notes (Signed)
Subjective:    Patient ID: Brandy Dunlap, female    DOB: [birth year], 65 y.o.   MRN: [id number]  HPI  Brandy Dunlap is a 65 yr old female who presents today with chief complaint of cough.  Associated symptoms include chest tightness, nasal drainage/congestion.  Symptoms began 5 days ago.  Reports sore throat.  Has clear nasal drainage. Denies fever.   She reports that she has been seeing the allergist and has been treated for 4 ear infections in the last year. She is requesting referral to ENT. Review of Systems See HPI  Past Medical History:  Diagnosis Date  . B12 deficiency   . Bronchitis    hx  . Colon polyp 10/06/2003  . GERD (gastroesophageal reflux disease)   . Hypertension   . Low back pain   . Osteoarthritis   . Sinus trouble      Social History   Social History  . Marital status: Married    Spouse name: N/A  . Number of children: 1  . Years of education: N/A   Occupational History  . teacher [employer]   Social History Main Topics  . Smoking status: Never Smoker  . Smokeless tobacco: Never Used  . Alcohol use No  . Drug use: No  . Sexual activity: Yes   Other Topics Concern  . Not on file   Social History Narrative   Divorced   One daughter- lives locally and one grandson   Retired Pharmacist, hospital,  Has masters degree   Enjoys reading, spending time with her grandson    Past Surgical History:  Procedure Laterality Date  . BACK SURGERY    . CHOLECYSTECTOMY    . COLONOSCOPY W/ BIOPSIES  10/06/2003  . LUMBAR FUSION  2008  . PARTIAL HIP ARTHROPLASTY Right 2009    hip replacement  . TONSILLECTOMY    . TOTAL KNEE ARTHROPLASTY Right 11/16/2013   Procedure: RIGHT TOTAL KNEE ARTHROPLASTY;  Surgeon: Vickey Huger, MD;  Location: [place];  Service: Orthopedics;  Laterality: Right;  [name] VIDEO BRONCHOSCOPY Bilateral 12/05/2012   Procedure: VIDEO BRONCHOSCOPY WITHOUT FLUORO;  Surgeon: Collene Gobble, MD;  Location: [facility];  Service: Cardiopulmonary;   Laterality: Bilateral;    Family History  Problem Relation Age of Onset  . COPD Father     died at age 37  . Emphysema Father   . Heart disease Brother     stent with 90%  . COPD Mother   . Heart disease Mother   . Ovarian cancer Mother   . Emphysema Mother     Allergies  Allergen Reactions  . Pantoprazole Sodium Other (See Comments)    Racing heart    Current Outpatient Prescriptions on File Prior to Visit  Medication Sig Dispense Refill  . amLODipine (NORVASC) 5 MG tablet Take 1 tablet (5 mg total) by mouth daily. 90 tablet 1  . cetirizine (ZYRTEC) 10 MG tablet Take 10 mg by mouth at bedtime as needed.  3  . DEXILANT 60 MG capsule TAKE ONE CAPSULE EVERY DAY 90 capsule 1  . EPIPEN 2-PAK 0.3 MG/0.3ML SOAJ injection   1  . mometasone (NASONEX) 50 MCG/ACT nasal spray Place 2 sprays into the nose daily. 17 g 12  . mometasone-formoterol (DULERA) 200-5 MCG/ACT AERO Inhale 1 puff into the lungs 2 (two) times daily.    . Multiple Vitamin (MULTIVITAMIN) capsule Take 1 capsule by mouth daily.      [name] omalizumab (XOLAIR) 150 MG injection  Inject 150 mg into the skin every 14 (fourteen) days.    [name] PROAIR RESPICLICK 123XX123 (90 BASE) MCG/ACT AEPB Inhale 2 puffs into the lungs every 6 (six) hours as needed.  0   No current facility-administered medications on file prior to visit.     BP (!) 149/67   Pulse 72   Temp 98.1 F (36.7 C) (Oral)   Ht 5\' 3"  (1.6 m)   SpO2 97%       Objective:   Physical Exam  Constitutional: She is oriented to person, place, and time. She appears well-developed and well-nourished.  HENT:  Head: Normocephalic and atraumatic.  Right Ear: Tympanic membrane and ear canal normal.  Left Ear: Tympanic membrane and ear canal normal.  Difficulty visualizing throat behind large tongue  Cardiovascular: Normal rate, regular rhythm and normal heart sounds.   No murmur heard. Pulmonary/Chest: Effort normal and breath sounds normal. No respiratory distress. She has no  wheezes.  Lymphadenopathy:    She has no cervical adenopathy.  Neurological: She is alert and oriented to person, place, and time.  Skin: Skin is warm and dry.  Psychiatric: She has a normal mood and affect. Her behavior is normal. Judgment and thought content normal.          Assessment & Plan:  Recurrent OM- no infection noted today. Refer to ENT for further evaluation.  Viral uri with cough-rapid strep is negative.  Advised pt for congestion you may use mucinex as needed. You may also use nasal saline spray. You may use proair as needed for wheezing/tightness. Call if new/worsening symptoms or if not improved in 3 days.

## 2016-03-05 ENCOUNTER — Ambulatory Visit (AMBULATORY_SURGERY_CENTER): Payer: Self-pay | Admitting: *Deleted

## 2016-03-05 VITALS — Ht 63.0 in | Wt 194.0 lb

## 2016-03-05 DIAGNOSIS — Z86010 Personal history of colonic polyps: Secondary | ICD-10-CM

## 2016-03-05 NOTE — Progress Notes (Signed)
J MOnday CRNA reviewed pt's bronchoscopy- ok to proceed  No egg or soy allergy  No anesthesia or intubation problems per pt  No diet medications taken  No home oxygen used or hx of sleep apnea

## 2016-03-07 ENCOUNTER — Other Ambulatory Visit: Payer: Self-pay | Admitting: Family

## 2016-03-12 ENCOUNTER — Other Ambulatory Visit: Payer: Self-pay | Admitting: Family

## 2016-03-12 ENCOUNTER — Encounter: Payer: Self-pay | Admitting: Internal Medicine

## 2016-03-23 ENCOUNTER — Encounter: Payer: Self-pay | Admitting: Internal Medicine

## 2016-03-23 ENCOUNTER — Ambulatory Visit (AMBULATORY_SURGERY_CENTER): Payer: Medicare Other | Admitting: Internal Medicine

## 2016-03-23 VITALS — BP 129/57 | HR 75 | Temp 97.8°F | Resp 13 | Ht 63.0 in | Wt 194.0 lb

## 2016-03-23 DIAGNOSIS — K635 Polyp of colon: Secondary | ICD-10-CM

## 2016-03-23 DIAGNOSIS — Z86010 Personal history of colonic polyps: Secondary | ICD-10-CM

## 2016-03-23 DIAGNOSIS — D124 Benign neoplasm of descending colon: Secondary | ICD-10-CM | POA: Diagnosis not present

## 2016-03-23 MED ORDER — SODIUM CHLORIDE 0.9 % IV SOLN: 500.0000 mL | INTRAVENOUS | Status: DC

## 2016-03-23 NOTE — Progress Notes (Signed)
To recovery vss report to [employer]

## 2016-03-23 NOTE — Op Note (Signed)
[place] Patient Name: Brandy Dunlap Procedure Date: 03/23/2016 11:29 AM MRN: [id number] Endoscopist: Gatha Mayer , MD Age: 66 Referring MD:  Date of Birth: 05/16/1950 Gender: Female Account #: [id number] Procedure:                Colonoscopy Indications:              Screening for colorectal malignant neoplasm, Last                            colonoscopy: 2005 Medicines:                Propofol per Anesthesia, Monitored Anesthesia Care Procedure:                Pre-Anesthesia Assessment:                           - Prior to the procedure, a History and Physical                            was performed, and patient medications and                            allergies were reviewed. The patient's tolerance of                            previous anesthesia was also reviewed. The risks                            and benefits of the procedure and the sedation                            options and risks were discussed with the patient.                            All questions were answered, and informed consent                            was obtained. Prior Anticoagulants: The patient has                            taken no previous anticoagulant or antiplatelet                            agents. ASA Grade Assessment: III - A patient with                            severe systemic disease. After reviewing the risks                            and benefits, the patient was deemed in                            satisfactory condition to undergo the procedure.  After obtaining informed consent, the colonoscope                            was passed under direct vision. Throughout the                            procedure, the patient's blood pressure, pulse, and                            oxygen saturations were monitored continuously. The                            Model PCF-H190DL [id number]) scope was introduced                            through the  anus and advanced to the the cecum,                            identified by appendiceal orifice and ileocecal                            valve. The colonoscopy was performed without                            difficulty. The patient tolerated the procedure                            well. The quality of the bowel preparation was                            good. The bowel preparation used was Miralax. The                            ileocecal valve, appendiceal orifice, and rectum                            were photographed. Scope In: 11:33:02 AM Scope Out: 11:44:12 AM Scope Withdrawal Time: 0 hours 9 minutes 33 seconds  Total Procedure Duration: 0 hours 11 minutes 10 seconds  Findings:                 The perianal and digital rectal examinations were                            normal.                           A 4 mm polyp was found in the descending colon. The                            polyp was sessile. The polyp was removed with a                            cold snare. Resection and retrieval were complete.  Verification of patient identification for the                            specimen was done. Estimated blood loss was minimal.                           Multiple small and large-mouthed diverticula were                            found in the sigmoid colon and descending colon.                           The exam was otherwise without abnormality on                            direct and retroflexion views. Complications:            No immediate complications. Estimated Blood Loss:     Estimated blood loss was minimal. Impression:               - One 4 mm polyp in the descending colon, removed                            with a cold snare. Resected and retrieved.                           - Diverticulosis in the sigmoid colon and in the                            descending colon.                           - The examination was otherwise normal on direct                             and retroflexion views. Recommendation:           - Patient has a contact number available for                            emergencies. The signs and symptoms of potential                            delayed complications were discussed with the                            patient. Return to normal activities tomorrow.                            Written discharge instructions were provided to the                            patient.                           - Resume previous diet.                           -  Continue present medications.                           - Repeat colonoscopy is recommended. The                            colonoscopy date will be determined after pathology                            results from today's exam become available for                            review. Gatha Mayer, MD 03/23/2016 11:48:36 AM This report has been signed electronically.

## 2016-03-23 NOTE — Patient Instructions (Addendum)
   I found and removed one tiny polyp. I will let you know pathology results and when to have another routine colonoscopy by mail.  I appreciate the opportunity to care for you. Gatha Mayer, MD, FACG YOU HAD AN ENDOSCOPIC PROCEDURE TODAY AT [facility]:   Refer to the procedure report that was given to you for any specific questions about what was found during the examination.  If the procedure report does not answer your questions, please call your gastroenterologist to clarify.  If you requested that your care partner not be given the details of your procedure findings, then the procedure report has been included in a sealed envelope for you to review at your convenience later.  YOU SHOULD EXPECT: Some feelings of bloating in the abdomen. Passage of more gas than usual.  Walking can help get rid of the air that was put into your GI tract during the procedure and reduce the bloating. If you had a lower endoscopy (such as a colonoscopy or flexible sigmoidoscopy) you may notice spotting of blood in your stool or on the toilet paper. If you underwent a bowel prep for your procedure, you may not have a normal bowel movement for a few days.  Please Note:  You might notice some irritation and congestion in your nose or some drainage.  This is from the oxygen used during your procedure.  There is no need for concern and it should clear up in a day or so.  SYMPTOMS TO REPORT IMMEDIATELY:   Following lower endoscopy (colonoscopy or flexible sigmoidoscopy):  Excessive amounts of blood in the stool  Significant tenderness or worsening of abdominal pains  Swelling of the abdomen that is new, acute  Fever of 100F or higher  For urgent or emergent issues, a gastroenterologist can be reached at any hour by calling [phone].   DIET:  We do recommend a small meal at first, but then you may proceed to your regular diet.  Drink plenty of fluids but you should avoid alcoholic  beverages for 24 hours.  ACTIVITY:  You should plan to take it easy for the rest of today and you should NOT DRIVE or use heavy machinery until tomorrow (because of the sedation medicines used during the test).    FOLLOW UP: Our staff will call the number listed on your records the next business day following your procedure to check on you and address any questions or concerns that you may have regarding the information given to you following your procedure. If we do not reach you, we will leave a message.  However, if you are feeling well and you are not experiencing any problems, there is no need to return our call.  We will assume that you have returned to your regular daily activities without incident.  If any biopsies were taken you will be contacted by phone or by letter within the next 1-3 weeks.  Please call us at [phone] if you have not heard about the biopsies in 3 weeks.   Await biopsy results to determined next repeat Colonoscopy screening Polyps (handout given) Diverticulosis (handout given)   SIGNATURES/CONFIDENTIALITY: You and/or your care partner have signed paperwork which will be entered into your electronic medical record.  These signatures attest to the fact that that the information above on your After Visit Summary has been reviewed and is understood.  Full responsibility of the confidentiality of this discharge information lies with you and/or your care-partner.

## 2016-03-26 ENCOUNTER — Telehealth: Payer: Self-pay

## 2016-03-26 DIAGNOSIS — H9202 Otalgia, left ear: Secondary | ICD-10-CM | POA: Insufficient documentation

## 2016-03-26 HISTORY — DX: Otalgia, left ear: H92.02

## 2016-03-26 NOTE — Telephone Encounter (Signed)
Left message on answering machine. 

## 2016-03-27 ENCOUNTER — Encounter: Payer: Self-pay | Admitting: Internal Medicine

## 2016-03-27 ENCOUNTER — Telehealth: Payer: Self-pay

## 2016-03-27 NOTE — Progress Notes (Signed)
Letter sent by MyChart

## 2016-03-27 NOTE — Progress Notes (Signed)
Hyperplastic polyp  Recall 2028

## 2016-03-27 NOTE — Telephone Encounter (Signed)
  Follow up Call-  Call back number 03/23/2016  Post procedure Call Back phone  # [phone]  Permission to leave phone message Yes  Some recent data might be hidden    Patient was called for follow up after her procedure on 03/23/2016. No answer at the number given for follow up phone call. A message was left on the answering machine.

## 2016-04-03 ENCOUNTER — Encounter (INDEPENDENT_AMBULATORY_CARE_PROVIDER_SITE_OTHER): Payer: Self-pay | Admitting: Family Medicine

## 2016-04-06 ENCOUNTER — Encounter: Payer: Self-pay | Admitting: Family

## 2016-04-06 ENCOUNTER — Ambulatory Visit (INDEPENDENT_AMBULATORY_CARE_PROVIDER_SITE_OTHER): Payer: Medicare Other | Admitting: Family

## 2016-04-06 VITALS — BP 140/72 | HR 65 | Temp 98.2°F | Resp 16 | Ht 63.0 in

## 2016-04-06 DIAGNOSIS — M545 Low back pain, unspecified: Secondary | ICD-10-CM

## 2016-04-06 LAB — POCT URINALYSIS DIPSTICK
Bilirubin, UA: NEGATIVE
Blood, UA: NEGATIVE
Glucose, UA: NEGATIVE
Ketones, UA: NEGATIVE
Leukocytes, UA: NEGATIVE
Nitrite, UA: NEGATIVE
Protein, UA: NEGATIVE
Spec Grav, UA: 1.02
Urobilinogen, UA: 0.2
pH, UA: 6

## 2016-04-06 MED ORDER — MELOXICAM 7.5 MG PO TABS: 7.5000 mg | ORAL_TABLET | Freq: Every day | ORAL | 0 refills | Status: DC

## 2016-04-06 MED ORDER — CYCLOBENZAPRINE HCL 5 MG PO TABS: 5.0000 mg | ORAL_TABLET | Freq: Three times a day (TID) | ORAL | 0 refills | Status: DC | PRN
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
## 2016-04-06 NOTE — Progress Notes (Signed)
Pre visit review using our clinic review tool, if applicable. No additional management support is needed unless otherwise documented below in the visit note. 

## 2016-04-06 NOTE — Patient Instructions (Addendum)
Please begin meloxicam once daily for back pain and flexeril as needed for pain.  Call if new/worsening pain or if symptoms are not improved in 1 week.

## 2016-04-12 ENCOUNTER — Encounter: Payer: Self-pay | Admitting: Family

## 2016-04-13 ENCOUNTER — Encounter: Payer: Self-pay | Admitting: Family

## 2016-04-13 ENCOUNTER — Ambulatory Visit (INDEPENDENT_AMBULATORY_CARE_PROVIDER_SITE_OTHER): Payer: Medicare Other | Admitting: Family

## 2016-04-13 VITALS — BP 138/68 | HR 91 | Temp 99.4°F | Ht 63.0 in

## 2016-04-13 DIAGNOSIS — R059 Cough, unspecified: Secondary | ICD-10-CM

## 2016-04-13 DIAGNOSIS — R05 Cough: Secondary | ICD-10-CM | POA: Diagnosis not present

## 2016-04-13 DIAGNOSIS — J029 Acute pharyngitis, unspecified: Secondary | ICD-10-CM | POA: Diagnosis not present

## 2016-04-13 LAB — POC INFLUENZA A&B (BINAX/QUICKVUE)
Influenza A, POC: NEGATIVE
Influenza B, POC: NEGATIVE

## 2016-04-13 LAB — POCT RAPID STREP A (OFFICE): Rapid Strep A Screen: NEGATIVE

## 2016-04-13 NOTE — Addendum Note (Signed)
Addended by: Kelle Darting A on: 04/13/2016 03:55 PM   Modules accepted: Orders

## 2016-04-13 NOTE — Progress Notes (Signed)
Subjective:    Patient ID: Brandy Dunlap, female    DOB: 02/06/1951, 66 y.o.   MRN: [id number]  HPI   Brandy Dunlap is a 66 yr old female who presents today with chief complaint of sore throat and fever.  Symptoms began 6 days ago. Had Tmax of 101 last fever was last night. Denies body aches.     Review of Systems See HPI  Past Medical History:  Diagnosis Date  . Allergy   . Asthma   . B12 deficiency   . Bronchitis    hx  . Colon polyp 10/06/2003  . GERD (gastroesophageal reflux disease)   . Hypertension   . Low back pain   . Osteoarthritis   . Sinus trouble      Social History   Social History  . Marital status: Married    Spouse name: N/A  . Number of children: 1  . Years of education: N/A   Occupational History  . teacher [employer]   Social History Main Topics  . Smoking status: Never Smoker  . Smokeless tobacco: Never Used  . Alcohol use No  . Drug use: No  . Sexual activity: Yes   Other Topics Concern  . Not on file   Social History Narrative   Divorced   One daughter- lives locally and one grandson   Retired Pharmacist, hospital,  Has masters degree   Enjoys reading, spending time with her grandson    Past Surgical History:  Procedure Laterality Date  . CHOLECYSTECTOMY    . COLONOSCOPY    . COLONOSCOPY W/ BIOPSIES  10/06/2003  . LUMBAR FUSION  2008  . PARTIAL HIP ARTHROPLASTY Right 2009    hip replacement  . TONSILLECTOMY    . TOTAL KNEE ARTHROPLASTY Right 11/16/2013   Procedure: RIGHT TOTAL KNEE ARTHROPLASTY;  Surgeon: Vickey Huger, MD;  Location: [facility];  Service: Orthopedics;  Laterality: Right;  [name] VIDEO BRONCHOSCOPY Bilateral 12/05/2012   Procedure: VIDEO BRONCHOSCOPY WITHOUT FLUORO;  Surgeon: Collene Gobble, MD;  Location: [facility];  Service: Cardiopulmonary;  Laterality: Bilateral;    Family History  Problem Relation Age of Onset  . COPD Father     died at age 64  . Emphysema Father   . Heart disease Brother     stent with  90%  . COPD Mother   . Heart disease Mother   . Ovarian cancer Mother   . Emphysema Mother   . Colon cancer Neg Hx   . Esophageal cancer Neg Hx   . Rectal cancer Neg Hx   . Stomach cancer Neg Hx     Allergies  Allergen Reactions  . Pantoprazole Sodium Other (See Comments)    Racing heart    Current Outpatient Prescriptions on File Prior to Visit  Medication Sig Dispense Refill  . amLODipine (NORVASC) 5 MG tablet TAKE 1 TABLET (5 MG TOTAL) BY MOUTH DAILY. 90 tablet 1  . cyclobenzaprine (FLEXERIL) 5 MG tablet Take 1 tablet (5 mg total) by mouth 3 (three) times daily as needed for muscle spasms. 20 tablet 0  . DEXILANT 60 MG capsule TAKE ONE CAPSULE EVERY DAY 90 capsule 1  . EPIPEN 2-PAK 0.3 MG/0.3ML SOAJ injection   1  . meloxicam (MOBIC) 7.5 MG tablet Take 1 tablet (7.5 mg total) by mouth daily. 14 tablet 0  . mometasone (NASONEX) 50 MCG/ACT nasal spray Place 2 sprays into the nose daily. 17 g 12  . mometasone-formoterol (DULERA) 200-5 MCG/ACT AERO  Inhale 1 puff into the lungs 2 (two) times daily.    . Multiple Vitamin (MULTIVITAMIN) capsule Take 1 capsule by mouth daily.      [name] omalizumab (XOLAIR) 150 MG injection Inject 150 mg into the skin every 14 (fourteen) days.    [name] PROAIR RESPICLICK 123XX123 (90 BASE) MCG/ACT AEPB Inhale 2 puffs into the lungs every 6 (six) hours as needed.  0   Current Facility-Administered Medications on File Prior to Visit  Medication Dose Route Frequency Provider Last Rate Last Dose  . 0.9 %  sodium chloride infusion  500 mL Intravenous Continuous Gatha Mayer, MD        BP 138/68 (BP Location: Right Arm, Patient Position: Sitting, Cuff Size: Large)   Pulse 91   Temp 99.4 F (37.4 C) (Oral)   Ht 5\' 3"  (1.6 m)   SpO2 99%       Objective:   Physical Exam  Constitutional: She appears well-developed and well-nourished.  HENT:  Head: Normocephalic and atraumatic.  Right Ear: Tympanic membrane and ear canal normal.  Left Ear: Tympanic membrane and  ear canal normal.  Difficulty visualizing posterior oropharynx.  Cardiovascular: Normal rate, regular rhythm and normal heart sounds.   No murmur heard. Pulmonary/Chest: Effort normal and breath sounds normal. No respiratory distress. She has no wheezes.  Psychiatric: She has a normal mood and affect. Her behavior is normal. Judgment and thought content normal.          Assessment & Plan:  Viral pharyngitis- Rapid flu is negative.  Rapid strep is negative. Advised pt as follows:  For Throat pain you my use ibuprofen and/or cepacol lozenges.  Call if new/worsening symptoms or if symptoms are not improved in 3 days.

## 2016-04-13 NOTE — Patient Instructions (Signed)
For Throat pain you my use ibuprofen and/or cepacol lozenges.  Call if new/worsening symptoms or if symptoms are not improved in 3 days.

## 2016-04-13 NOTE — Progress Notes (Signed)
Pre visit review using our clinic review tool, if applicable. No additional management support is needed unless otherwise documented below in th

## 2016-04-17 ENCOUNTER — Ambulatory Visit (INDEPENDENT_AMBULATORY_CARE_PROVIDER_SITE_OTHER): Payer: Medicare Other | Admitting: Family Medicine

## 2016-04-17 ENCOUNTER — Encounter (INDEPENDENT_AMBULATORY_CARE_PROVIDER_SITE_OTHER): Payer: Self-pay | Admitting: Family Medicine

## 2016-04-17 VITALS — BP 147/74 | HR 82 | Temp 98.6°F | Resp 12 | Ht 63.0 in | Wt 195.0 lb

## 2016-04-17 DIAGNOSIS — Z1331 Encounter for screening for depression: Secondary | ICD-10-CM

## 2016-04-17 DIAGNOSIS — R0602 Shortness of breath: Secondary | ICD-10-CM

## 2016-04-17 DIAGNOSIS — Z1389 Encounter for screening for other disorder: Secondary | ICD-10-CM

## 2016-04-17 DIAGNOSIS — E669 Obesity, unspecified: Secondary | ICD-10-CM

## 2016-04-17 DIAGNOSIS — Z9189 Other specified personal risk factors, not elsewhere classified: Secondary | ICD-10-CM

## 2016-04-17 DIAGNOSIS — Z0289 Encounter for other administrative examinations: Secondary | ICD-10-CM

## 2016-04-17 DIAGNOSIS — Z6834 Body mass index (BMI) 34.0-34.9, adult: Secondary | ICD-10-CM

## 2016-04-17 DIAGNOSIS — J45909 Unspecified asthma, uncomplicated: Secondary | ICD-10-CM

## 2016-04-17 DIAGNOSIS — I10 Essential (primary) hypertension: Secondary | ICD-10-CM

## 2016-04-17 DIAGNOSIS — J45998 Other asthma: Secondary | ICD-10-CM

## 2016-04-17 DIAGNOSIS — R5383 Other fatigue: Secondary | ICD-10-CM

## 2016-04-17 NOTE — Progress Notes (Signed)
Office: [phone]  /  Fax: [phone]   HPI:   Chief Complaint: OBESITY  Brandy Dunlap (MR# [id number]) is a 66 y.o. female who presents on 04/17/2016 for obesity evaluation and treatment. Current BMI is Body mass index is 34.54 kg/m.Brandy Dunlap has struggled with obesity for years and has been unsuccessful in either losing weight or maintaining long term weight loss. Brandy Dunlap attended our information session and states she is currently in the action stage of change and ready to dedicate time achieving and maintaining a healthier weight.  Brandy Dunlap states her desired weight loss is 55lbs.  she started gaining weight at the age of 66 yrs old.  her heaviest weight ever was 210 lbs. she snacks frequently in the evenings she skips meals frequently she frequently makes poor food choices she has problems with excessive hunger  she frequently eats larger portions than normal  she struggles with emotional eating    Fatigue Brandy Dunlap feels her energy is lower than it should be. This has worsened with weight gain and has worsened recently. Brandy Dunlap admits to daytime somnolence and  admits to waking up still tired. Patient is at risk for obstructive sleep apnea. Patent has a history of symptoms of morning fatigue. Patient generally gets 7 or 8 hours of sleep per night, and states they generally have generally restful sleep. Snoring is present. Apneic episodes is not present. Epworth Sleepiness Score is 10  Dyspnea on exertion Brandy Dunlap notes increasing shortness of breath with exercising and seems to be worsening over time with weight gain. She notes getting out of breath sooner with activity than she used to. This has not gotten worse recently. Brandy Dunlap denies orthopnea.  Severe Asthma - Currently controlled She is currently on Xolair, Dulara and Albuterol as needed without recent exacerbations and no history of intubation.   Hypertension Brandy Dunlap is a 66 y.o. female with hypertension.  Brandy Dunlap denies  chest pain or shortness of breath on exertion. She is working weight loss to help control her blood pressure with the goal of decreasing her risk of heart attack and stroke. [name] blood pressure is not currently controlled. It is normally controlled on meds and has had a cold recently. She denies headaches and chest pain.   At risk for cardiovascular disease Brandy Dunlap is at a higher than average risk for cardiovascular disease due to obesity. She has a strong family history of CAD and Mi. EKG shows mild changes consistent with her history of asthma.  She currently denies any chest pain.   Depression Screen Brandy Dunlap's Food and Mood (modified PHQ-9) score was  Depression screen PHQ 2/9 04/17/2016  Decreased Interest 1  Down, Depressed, Hopeless 0  PHQ - 2 Score 1  Altered sleeping 1  Tired, decreased energy 2  Change in appetite 1  Feeling bad or failure about yourself  1  Trouble concentrating 1  Moving slowly or fidgety/restless 0  Suicidal thoughts 0  PHQ-9 Score 7    ALLERGIES: Allergies  Allergen Reactions  . Pantoprazole Sodium Other (See Comments)    Racing heart    MEDICATIONS: Current Outpatient Prescriptions on File Prior to Visit  Medication Sig Dispense Refill  . amLODipine (NORVASC) 5 MG tablet TAKE 1 TABLET (5 MG TOTAL) BY MOUTH DAILY. 90 tablet 1  . DEXILANT 60 MG capsule TAKE ONE CAPSULE EVERY DAY 90 capsule 1  . EPIPEN 2-PAK 0.3 MG/0.3ML SOAJ injection   1  . mometasone (NASONEX) 50 MCG/ACT nasal spray Place 2 sprays  into the nose daily. 17 g 12  . mometasone-formoterol (DULERA) 200-5 MCG/ACT AERO Inhale 1 puff into the lungs 2 (two) times daily.    . Multiple Vitamin (MULTIVITAMIN) capsule Take 1 capsule by mouth daily.      Brandy Dunlap omalizumab (XOLAIR) 150 MG injection Inject 150 mg into the skin every 14 (fourteen) days.    Brandy Dunlap PROAIR RESPICLICK 123XX123 (90 BASE) MCG/ACT AEPB Inhale 2 puffs into the lungs every 6 (six) hours as needed.  0  . cyclobenzaprine (FLEXERIL) 5 MG tablet  Take 1 tablet (5 mg total) by mouth 3 (three) times daily as needed for muscle spasms. (Patient not taking: Reported on 04/17/2016) 20 tablet 0  . meloxicam (MOBIC) 7.5 MG tablet Take 1 tablet (7.5 mg total) by mouth daily. (Patient not taking: Reported on 04/17/2016) 14 tablet 0   No current facility-administered medications on file prior to visit.     PAST MEDICAL HISTORY: Past Medical History:  Diagnosis Date  . Allergy   . Asthma   . B12 deficiency   . Back pain   . Bronchitis    hx  . Colon polyp 10/06/2003  . GERD (gastroesophageal reflux disease)   . Hypertension   . Low back pain   . Osteoarthritis   . Sinus trouble     PAST SURGICAL HISTORY: Past Surgical History:  Procedure Laterality Date  . CHOLECYSTECTOMY    . COLONOSCOPY    . COLONOSCOPY W/ BIOPSIES  10/06/2003  . LUMBAR FUSION  2008  . PARTIAL HIP ARTHROPLASTY Right 2009    hip replacement  . TONSILLECTOMY    . TOTAL KNEE ARTHROPLASTY Right 11/16/2013   Procedure: RIGHT TOTAL KNEE ARTHROPLASTY;  Surgeon: Vickey Huger, MD;  Location: [facility];  Service: Orthopedics;  Laterality: Right;  Brandy Dunlap VIDEO BRONCHOSCOPY Bilateral 12/05/2012   Procedure: VIDEO BRONCHOSCOPY WITHOUT FLUORO;  Surgeon: Collene Gobble, MD;  Location: [facility];  Service: Cardiopulmonary;  Laterality: Bilateral;    SOCIAL HISTORY: Social History  Substance Use Topics  . Smoking status: Never Smoker  . Smokeless tobacco: Never Used  . Alcohol use No    FAMILY HISTORY: Family History  Problem Relation Age of Onset  . COPD Father     died at age 2  . Emphysema Father   . Heart Problems Father   . Heart disease Brother     stent with 90%  . COPD Mother   . Heart disease Mother   . Ovarian cancer Mother   . Emphysema Mother   . Hypertension Mother   . Hyperlipidemia Mother   . Heart Problems Mother   . Stroke Mother   . Thyroid disease Mother   . Cancer Mother   . Colon cancer Neg Hx   . Esophageal cancer Neg Hx   . Rectal cancer  Neg Hx   . Stomach cancer Neg Hx     ROS: Review of Systems  Constitutional: Positive for malaise/fatigue.  HENT: Positive for hearing loss.   Respiratory: Positive for cough and wheezing.        Nose Discharge  Cardiovascular:       Leg cramping  Gastrointestinal: Positive for heartburn.  Musculoskeletal: Positive for joint pain.       Neck stiffness  Skin:       Dryness Hair  Or Nail changes  All other systems reviewed and are negative.   PHYSICAL EXAM: Blood pressure (!) 147/74, pulse 82, temperature 98.6 F (37 C), temperature source Oral, resp. rate 12,  height 5\' 3"  (1.6 m), weight 195 lb (88.5 kg), SpO2 95 %. Body mass index is 34.54 kg/m. Physical Exam  Constitutional: She is oriented to person, place, and time. She appears well-developed and well-nourished.  HENT:  Head: Normocephalic and atraumatic.  Eyes: EOM are normal. Pupils are equal, round, and reactive to light.  Neck: Normal range of motion. Neck supple.  Cardiovascular: Normal rate and regular rhythm.   Pulmonary/Chest: Effort normal and breath sounds normal.  Abdominal: Soft. Bowel sounds are normal.  Musculoskeletal: She exhibits edema (on BLE).  Neurological: She is alert and oriented to person, place, and time.  Skin: Skin is warm and dry.  Psychiatric: She has a normal mood and affect. Her behavior is normal.  Vitals reviewed.   RECENT LABS AND TESTS: BMET    Component Value Date/Time   NA 139 10/24/2015 1054   K 4.0 10/24/2015 1054   CL 104 10/24/2015 1054   CO2 27 10/24/2015 1054   GLUCOSE 95 10/24/2015 1054   BUN 12 10/24/2015 1054   CREATININE 0.74 10/24/2015 1054   CALCIUM 9.0 10/24/2015 1054   GFRNONAA 87 (L) 11/17/2013 0546   GFRAA >90 11/17/2013 0546   No results found for: HGBA1C No results found for: INSULIN CBC    Component Value Date/Time   WBC 6.6 10/24/2015 1054   RBC 4.48 10/24/2015 1054   HGB 12.4 10/24/2015 1054   HCT 37.3 10/24/2015 1054   PLT 266.0 10/24/2015  1054   MCV 83.1 10/24/2015 1054   MCH 29.3 11/18/2013 0705   MCHC 33.4 10/24/2015 1054   RDW 16.4 (H) 10/24/2015 1054   LYMPHSABS 2.9 10/24/2015 1054   MONOABS 0.4 10/24/2015 1054   EOSABS 0.4 10/24/2015 1054   BASOSABS 0.0 10/24/2015 1054   Iron/TIBC/Ferritin/ %Sat No results found for: IRON, TIBC, FERRITIN, IRONPCTSAT Lipid Panel     Component Value Date/Time   CHOL 187 10/24/2015 1054   TRIG 108.0 10/24/2015 1054   HDL 56.60 10/24/2015 1054   CHOLHDL 3 10/24/2015 1054   VLDL 21.6 10/24/2015 1054   LDLCALC 109 (H) 10/24/2015 1054   LDLDIRECT 149.9 09/11/2010 0908   Hepatic Function Panel     Component Value Date/Time   PROT 6.9 10/24/2015 1054   ALBUMIN 4.0 10/24/2015 1054   AST 25 10/24/2015 1054   ALT 30 10/24/2015 1054   ALKPHOS 63 10/24/2015 1054   BILITOT 0.4 10/24/2015 1054   BILIDIR 0.0 10/24/2015 1054      Component Value Date/Time   TSH 1.15 10/24/2015 1054   TSH 1.30 04/25/2015 1132   TSH 1.24 10/08/2014 1204    ECG  shows NSR with a rate of 74 bpm INDIRECT CALORIMETER done today shows a VO2 of 167 and a REE of 1161.    ASSESSMENT AND PLAN: Other fatigue - Plan: EKG 12-Lead, CBC With Differential, Comprehensive metabolic panel, Folate, Hemoglobin A1c, Insulin, random, Lipid Panel With LDL/HDL Ratio, T3, T4, free, VITAMIN D 25 Hydroxy (Vit-D Deficiency, Fractures), TSH, Vitamin B12  Shortness of breath on exertion  Depression screening  Essential hypertension  Severe asthma, unspecified whether complicated, unspecified whether persistent  At risk for heart disease  Morbid obesity ([place])  PLAN:  Fatigue Junella was informed that her fatigue may be related to obesity, depression or many other causes. Labs will be ordered, and in the meanwhile Jubilee has agreed to work on diet, exercise and weight loss to help with fatigue. Proper sleep hygiene was discussed including the need for 7-8 hours of  quality sleep each night. A sleep study was not ordered  based on symptoms and Epworth score.  Dyspnea on exertion Rabia's shortness of breath appears to be obesity related and exercise induced. She has agreed to work on weight loss and gradually increase exercise to treat her exercise induced shortness of breath. If Tyja follows our instructions and loses weight without improvement of her shortness of breath, we will plan to refer to pulmonology. We will monitor this condition regularly. Caffie agrees to this plan.  Severe Asthma She will continue medications as prescribed. Patient was advised that weight loss will also help to control asthma. Will follow up in 2 weeks to assess.   Hypertension We discussed sodium restriction, working on healthy weight loss, and a regular exercise program as the means to achieve improved blood pressure control. Ilyn agreed with this plan and agreed to follow up in 2 weeks. We will continue to monitor her blood pressure as well as her progress with the above lifestyle modifications. She will continue her medications as prescribed and will watch for signs of hypotension as she continues her lifestyle modifications.   Cardiovascular risk counselling Arron was given extended (at least 15 minutes) coronary artery disease prevention counseling today. She is 66 y.o. female and has risk factors for heart disease including obesity. We discussed intensive lifestyle modifications today with an emphasis on specific weight loss instructions and strategies. Pt was also informed of the importance of increasing exercise and decreasing saturated fats to help prevent heart disease.   Depression Screen Callie had a mildly positive depression screening. Depression is commonly associated with obesity and often results in emotional eating behaviors. We will monitor this closely and work on CBT to help improve the non-hunger eating patterns. Referral to Psychology may be required if no improvement is seen as she continues in our  clinic.  Obesity Lakeema is currently in the action stage of change and her goal is to continue with weight loss efforts She has agreed to follow the Category 1 plan Ursela has been instructed to work up to a goal of 150 minutes of combined cardio and strengthening exercise per week for weight loss and overall health benefits. We discussed the following Behavioral Modification Stratagies today: increasing lean protein intake, decreasing simple carbohydrates  and decrease eating out  Amarely has agreed to follow up with our clinic in 2 weeks. She was informed of the importance of frequent follow up visits to maximize her success with intensive lifestyle modifications for her multiple health conditions. She was informed we would discuss her lab results at her next visit unless there is a critical issue that needs to be addressed sooner. Ezmae agreed to keep her next visit at the agreed upon time to discuss these results.  I, April Moore , am acting as Education administrator for Dennard Nip, MD  I have reviewed the above documentation for accuracy and completeness, and I agree with the above. -Dennard Nip, MD

## 2016-04-18 LAB — CBC WITH DIFFERENTIAL
Basophils Absolute: 0 10*3/uL (ref 0.0–0.2)
Basos: 1 %
EOS (ABSOLUTE): 0.2 10*3/uL (ref 0.0–0.4)
Eos: 3 %
Hematocrit: 39.5 % (ref 34.0–46.6)
Hemoglobin: 12.7 g/dL (ref 11.1–15.9)
Immature Grans (Abs): 0 10*3/uL (ref 0.0–0.1)
Immature Granulocytes: 0 %
Lymphocytes Absolute: 2.7 10*3/uL (ref 0.7–3.1)
Lymphs: 53 %
MCH: 27.1 pg (ref 26.6–33.0)
MCHC: 32.2 g/dL (ref 31.5–35.7)
MCV: 84 fL (ref 79–97)
Monocytes Absolute: 0.3 10*3/uL (ref 0.1–0.9)
Monocytes: 7 %
Neutrophils Absolute: 1.8 10*3/uL (ref 1.4–7.0)
Neutrophils: 36 %
RBC: 4.68 x10E6/uL (ref 3.77–5.28)
RDW: 16 % — ABNORMAL HIGH (ref 12.3–15.4)
WBC: 5.1 10*3/uL (ref 3.4–10.8)

## 2016-04-18 LAB — LIPID PANEL WITH LDL/HDL RATIO
Cholesterol, Total: 157 mg/dL (ref 100–199)
HDL: 42 mg/dL (ref >39)
LDL Calculated: 71 (ref 0–99)
LDl/HDL Ratio: 1.7 (ref 0.0–3.2)
Triglycerides: 222 mg/dL — ABNORMAL HIGH (ref 0–149)
VLDL Cholesterol Cal: 44 — ABNORMAL HIGH (ref 5–40)

## 2016-04-18 LAB — COMPREHENSIVE METABOLIC PANEL
ALT: 28 IU/L (ref 0–32)
AST: 28 IU/L (ref 0–40)
Albumin/Globulin Ratio: 1.8 (ref 1.2–2.2)
Albumin: 4.3 g/dL (ref 3.6–4.8)
Alkaline Phosphatase: 78 IU/L (ref 39–117)
BUN/Creatinine Ratio: 14 (ref 12–28)
BUN: 10 mg/dL (ref 8–27)
Bilirubin Total: 0.3 mg/dL (ref 0.0–1.2)
CO2: 23 mmol/L (ref 18–29)
Calcium: 8.9 mg/dL (ref 8.7–10.3)
Chloride: 103 mmol/L (ref 96–106)
Creatinine, Ser: 0.72 mg/dL (ref 0.57–1.00)
GFR calc Af Amer: 102 (ref >59)
GFR calc non Af Amer: 88 (ref >59)
Globulin, Total: 2.4 (ref 1.5–4.5)
Glucose: 83 mg/dL (ref 65–99)
Potassium: 3.8 mmol/L (ref 3.5–5.2)
Sodium: 140 mmol/L (ref 134–144)
Total Protein: 6.7 g/dL (ref 6.0–8.5)

## 2016-04-18 LAB — VITAMIN B12: Vitamin B-12: 456 pg/mL (ref 232–1245)

## 2016-04-18 LAB — INSULIN, RANDOM: INSULIN: 13.9 u[IU]/mL (ref 2.6–24.9)

## 2016-04-18 LAB — T3: T3, Total: 122 ng/dL (ref 71–180)

## 2016-04-18 LAB — FOLATE: Folate: 11.8 ng/mL (ref >3.0)

## 2016-04-18 LAB — T4, FREE: Free T4: 1.39 ng/dL (ref 0.82–1.77)

## 2016-04-18 LAB — HEMOGLOBIN A1C
Est. average glucose Bld gHb Est-mCnc: 120
Hgb A1c MFr Bld: 5.8 % — ABNORMAL HIGH (ref 4.8–5.6)

## 2016-04-18 LAB — TSH: TSH: 1.89 u[IU]/mL (ref 0.450–4.500)

## 2016-04-18 LAB — VITAMIN D 25 HYDROXY (VIT D DEFICIENCY, FRACTURES): Vit D, 25-Hydroxy: 16.6 ng/mL — ABNORMAL LOW (ref 30.0–100.0)

## 2016-04-27 ENCOUNTER — Ambulatory Visit: Payer: BC Managed Care – PPO | Admitting: Family

## 2016-05-02 ENCOUNTER — Ambulatory Visit (INDEPENDENT_AMBULATORY_CARE_PROVIDER_SITE_OTHER): Payer: Medicare Other | Admitting: Family Medicine

## 2016-05-02 ENCOUNTER — Encounter (INDEPENDENT_AMBULATORY_CARE_PROVIDER_SITE_OTHER): Payer: Self-pay | Admitting: Family Medicine

## 2016-05-02 VITALS — BP 140/81 | HR 83 | Temp 98.7°F | Resp 14 | Ht 63.0 in | Wt 190.0 lb

## 2016-05-02 DIAGNOSIS — E669 Obesity, unspecified: Secondary | ICD-10-CM

## 2016-05-02 DIAGNOSIS — R7303 Prediabetes: Secondary | ICD-10-CM

## 2016-05-02 DIAGNOSIS — Z6833 Body mass index (BMI) 33.0-33.9, adult: Secondary | ICD-10-CM | POA: Diagnosis not present

## 2016-05-02 DIAGNOSIS — Z9189 Other specified personal risk factors, not elsewhere classified: Secondary | ICD-10-CM | POA: Diagnosis not present

## 2016-05-02 DIAGNOSIS — E559 Vitamin D deficiency, unspecified: Secondary | ICD-10-CM

## 2016-05-02 MED ORDER — METFORMIN HCL 500 MG PO TABS: 500.0000 mg | ORAL_TABLET | Freq: Every day | ORAL | 0 refills | Status: DC

## 2016-05-02 MED ORDER — VITAMIN D3 1.25 MG (50000 UT) PO CAPS: 1.0000 | ORAL_CAPSULE | ORAL | 0 refills | Status: DC

## 2016-05-02 NOTE — Progress Notes (Signed)
Office: [phone]  /  Fax: [phone]   HPI:   Chief Complaint: OBESITY Brandy Dunlap is here to discuss her progress with her obesity treatment plan. She is following her eating plan approximately 95 % of the time and states she is exercising 40 to 45 minutes 5 times per week. Brandy Dunlap has done well with weight loss. She wanted to eat more vegetables for dinner. She would like for dinner to be more interesting as she is getting bored.  Her weight is 190 lb (86.2 kg) today and has had a weight loss of 5 pounds over a period of 2 weeks since her last visit. She has lost 5 lbs since starting treatment with [name].  Vitamin D deficiency Brandy Dunlap has a new diagnosis of vitamin D deficiency. She is not currently taking vit D. She admits fatigue and denies nausea, vomiting or muscle weakness.  Pre-Diabetes Brandy Dunlap has a diagnosis of prediabetes based on her elevated Hgb A1c at 5.8 and was informed this puts her at greater risk of developing diabetes. She is not taking metformin currently and continues to work on diet and exercise to decrease risk of diabetes. She admits polyphagia and denies nausea or hypoglycemia.  At risk for diabetes Brandy Dunlap is at higher than average risk for developing diabetes due to her prediabetes and obesity. She currently denies polyuria or polydipsia.   Wt Readings from Last 500 Encounters:  05/02/16 190 lb (86.2 kg)  04/17/16 195 lb (88.5 kg)  03/23/16 194 lb (88 kg)  03/05/16 194 lb (88 kg)  10/24/15 189 lb 8 oz (86 kg)  06/29/15 200 lb 12.8 oz (91.1 kg)  05/23/15 210 lb (95.3 kg)  04/25/15 207 lb 12.8 oz (94.3 kg)  02/10/15 206 lb (93.4 kg)  12/13/14 206 lb 12.8 oz (93.8 kg)  10/25/14 205 lb 2 oz (93 kg)  10/08/14 200 lb 8 oz (90.9 kg)  07/19/14 201 lb 1.6 oz (91.2 kg)  05/10/14 202 lb (91.6 kg)  03/29/14 201 lb (91.2 kg)  03/24/14 202 lb (91.6 kg)  11/17/13 204 lb (92.5 kg)  11/05/13 204 lb 2 oz (92.6 kg)  10/05/13 209 lb (94.8 kg)  08/07/13 207 lb (93.9 kg)  06/24/13  194 lb (88 kg)  05/25/13 197 lb (89.4 kg)  03/25/13 198 lb (89.8 kg)  02/03/13 201 lb (91.2 kg)  01/25/13 189 lb 6 oz (85.9 kg)  12/24/12 201 lb 3.2 oz (91.3 kg)  11/21/12 202 lb (91.6 kg)  11/06/12 201 lb 12.8 oz (91.5 kg)  10/03/12 203 lb (92.1 kg)  09/18/12 203 lb 9.6 oz (92.4 kg)  09/05/12 203 lb 2 oz (92.1 kg)  08/08/12 201 lb (91.2 kg)  08/05/12 202 lb (91.6 kg)  06/26/12 202 lb 9.6 oz (91.9 kg)  04/23/12 200 lb (90.7 kg)  04/18/12 197 lb (89.4 kg)  01/01/12 202 lb (91.6 kg)  11/23/11 190 lb (86.2 kg)  10/10/11 198 lb (89.8 kg)  10/03/11 196 lb (88.9 kg)  06/06/11 199 lb (90.3 kg)  01/17/11 200 lb (90.7 kg)  09/25/10 200 lb (90.7 kg)  04/21/10 202 lb (91.6 kg)  04/03/10 202 lb (91.6 kg)  03/23/10 197 lb (89.4 kg)  01/18/10 197 lb (89.4 kg)  09/21/09 198 lb (89.8 kg)  06/29/09 200 lb (90.7 kg)  04/05/09 202 lb (91.6 kg)  02/12/09 202 lb (91.6 kg)  09/06/08 195 lb (88.5 kg)  06/16/08 191 lb (86.6 kg)  02/16/08 188 lb (85.3 kg)  12/05/07 190 lb (86.2 kg)  10/08/07 199 lb (90.3 kg)  07/24/07 204 lb (92.5 kg)     ALLERGIES: Allergies  Allergen Reactions  . Pantoprazole Sodium Other (See Comments)    Racing heart    MEDICATIONS: Current Outpatient Prescriptions on File Prior to Visit  Medication Sig Dispense Refill  . amLODipine (NORVASC) 5 MG tablet TAKE 1 TABLET (5 MG TOTAL) BY MOUTH DAILY. 90 tablet 1  . cyclobenzaprine (FLEXERIL) 5 MG tablet Take 1 tablet (5 mg total) by mouth 3 (three) times daily as needed for muscle spasms. (Patient not taking: Reported on 04/17/2016) 20 tablet 0  . DEXILANT 60 MG capsule TAKE ONE CAPSULE EVERY DAY 90 capsule 1  . EPIPEN 2-PAK 0.3 MG/0.3ML SOAJ injection   1  . meloxicam (MOBIC) 7.5 MG tablet Take 1 tablet (7.5 mg total) by mouth daily. (Patient not taking: Reported on 04/17/2016) 14 tablet 0  . mometasone (NASONEX) 50 MCG/ACT nasal spray Place 2 sprays into the nose daily. 17 g 12  . mometasone-formoterol (DULERA) 200-5  MCG/ACT AERO Inhale 1 puff into the lungs 2 (two) times daily. (Patient not taking: Reported on 05/02/2016)    . Multiple Vitamin (MULTIVITAMIN) capsule Take 1 capsule by mouth daily.      [name] omalizumab (XOLAIR) 150 MG injection Inject 150 mg into the skin every 14 (fourteen) days.    [name] PROAIR RESPICLICK 034 (90 BASE) MCG/ACT AEPB Inhale 2 puffs into the lungs every 6 (six) hours as needed.  0   No current facility-administered medications on file prior to visit.     PAST MEDICAL HISTORY: Past Medical History:  Diagnosis Date  . Allergy   . Asthma   . B12 deficiency   . Back pain   . Bronchitis    hx  . Colon polyp 10/06/2003  . GERD (gastroesophageal reflux disease)   . Hypertension   . Low back pain   . Osteoarthritis   . Sinus trouble     PAST SURGICAL HISTORY: Past Surgical History:  Procedure Laterality Date  . CHOLECYSTECTOMY    . COLONOSCOPY    . COLONOSCOPY W/ BIOPSIES  10/06/2003  . LUMBAR FUSION  2008  . PARTIAL HIP ARTHROPLASTY Right 2009    hip replacement  . TONSILLECTOMY    . TOTAL KNEE ARTHROPLASTY Right 11/16/2013   Procedure: RIGHT TOTAL KNEE ARTHROPLASTY;  Surgeon: Vickey Huger, MD;  Location: [facility];  Service: Orthopedics;  Laterality: Right;  [name] VIDEO BRONCHOSCOPY Bilateral 12/05/2012   Procedure: VIDEO BRONCHOSCOPY WITHOUT FLUORO;  Surgeon: Collene Gobble, MD;  Location: [facility];  Service: Cardiopulmonary;  Laterality: Bilateral;    SOCIAL HISTORY: Social History  Substance Use Topics  . Smoking status: Never Smoker  . Smokeless tobacco: Never Used  . Alcohol use No    FAMILY HISTORY: Family History  Problem Relation Age of Onset  . COPD Father     died at age 58  . Emphysema Father   . Heart Problems Father   . Heart disease Brother     stent with 90%  . COPD Mother   . Heart disease Mother   . Ovarian cancer Mother   . Emphysema Mother   . Hypertension Mother   . Hyperlipidemia Mother   . Heart Problems Mother   . Stroke Mother   .  Thyroid disease Mother   . Cancer Mother   . Colon cancer Neg Hx   . Esophageal cancer Neg Hx   . Rectal cancer Neg Hx   . Stomach cancer  Neg Hx     ROS: Review of Systems  Constitutional: Positive for malaise/fatigue and weight loss.  Gastrointestinal: Negative for nausea and vomiting.  Musculoskeletal:       Negative muscle weakness  Endo/Heme/Allergies:       Polyphagia    PHYSICAL EXAM: Blood pressure 140/81, pulse 83, temperature 98.7 F (37.1 C), temperature source Oral, resp. rate 14, height 5\' 3"  (1.6 m), weight 190 lb (86.2 kg), SpO2 97 %. Body mass index is 33.66 kg/m. Physical Exam  Constitutional: She is oriented to person, place, and time. She appears well-developed and well-nourished.  Cardiovascular: Normal rate.   Pulmonary/Chest: Effort normal.  Musculoskeletal: Normal range of motion.  Neurological: She is oriented to person, place, and time.  Skin: Skin is warm and dry.  Psychiatric: She has a normal mood and affect. Her behavior is normal.  Vitals reviewed.   RECENT LABS AND TESTS: BMET    Component Value Date/Time   NA 140 04/17/2016 1150   K 3.8 04/17/2016 1150   CL 103 04/17/2016 1150   CO2 23 04/17/2016 1150   GLUCOSE 83 04/17/2016 1150   GLUCOSE 95 10/24/2015 1054   BUN 10 04/17/2016 1150   CREATININE 0.72 04/17/2016 1150   CALCIUM 8.9 04/17/2016 1150   GFRNONAA 88 04/17/2016 1150   GFRAA 102 04/17/2016 1150   Lab Results  Component Value Date   HGBA1C 5.8 (H) 04/17/2016   Lab Results  Component Value Date   INSULIN 13.9 04/17/2016   CBC    Component Value Date/Time   WBC 5.1 04/17/2016 1150   WBC 6.6 10/24/2015 1054   RBC 4.68 04/17/2016 1150   RBC 4.48 10/24/2015 1054   HGB 12.4 10/24/2015 1054   HCT 39.5 04/17/2016 1150   PLT 266.0 10/24/2015 1054   MCV 84 04/17/2016 1150   MCH 27.1 04/17/2016 1150   MCH 29.3 11/18/2013 0705   MCHC 32.2 04/17/2016 1150   MCHC 33.4 10/24/2015 1054   RDW 16.0 (H) 04/17/2016 1150    LYMPHSABS 2.7 04/17/2016 1150   MONOABS 0.4 10/24/2015 1054   EOSABS 0.2 04/17/2016 1150   BASOSABS 0.0 04/17/2016 1150   Iron/TIBC/Ferritin/ %Sat No results found for: IRON, TIBC, FERRITIN, IRONPCTSAT Lipid Panel     Component Value Date/Time   CHOL 157 04/17/2016 1150   TRIG 222 (H) 04/17/2016 1150   HDL 42 04/17/2016 1150   CHOLHDL 3 10/24/2015 1054   VLDL 21.6 10/24/2015 1054   LDLCALC 71 04/17/2016 1150   LDLDIRECT 149.9 09/11/2010 0908   Hepatic Function Panel     Component Value Date/Time   PROT 6.7 04/17/2016 1150   ALBUMIN 4.3 04/17/2016 1150   AST 28 04/17/2016 1150   ALT 28 04/17/2016 1150   ALKPHOS 78 04/17/2016 1150   BILITOT 0.3 04/17/2016 1150   BILIDIR 0.0 10/24/2015 1054      Component Value Date/Time   TSH 1.890 04/17/2016 1150   TSH 1.15 10/24/2015 1054   TSH 1.30 04/25/2015 1132    ASSESSMENT AND PLAN: Vitamin D deficiency - Plan: Cholecalciferol (VITAMIN D3) 50000 units CAPS  Prediabetes - Plan: metFORMIN (GLUCOPHAGE) 500 MG tablet  At risk for diabetes mellitus  Class 1 obesity without serious comorbidity with body mass index (BMI) of 33.0 to 33.9 in adult, unspecified obesity type  PLAN:  Vitamin D Deficiency Brandy Dunlap was informed that low vitamin D levels contributes to fatigue and are associated with obesity, breast, and colon cancer. She agrees to start to take prescription Vit  D @50 ,000 IU every week #4 with no refills and will follow up for routine testing of vitamin D, at least 2-3 times per year. She was informed of the risk of over-replacement of vitamin D and agrees to not increase her dose unless he discusses this with [name] first.  Pre-Diabetes Brandy Dunlap will continue to work on weight loss, exercise, and decreasing simple carbohydrates in her diet to help decrease the risk of diabetes. We dicussed metformin including benefits and risks. She was informed that eating too many simple carbohydrates or too many calories at one sitting increases the  likelihood of GI side effects. Brandy Dunlap requested metformin for now and a prescription was written today for Metformin 500 mg every morning #30 with no refills. Brandy Dunlap agreed to follow up with [name] as directed to monitor her progress.  Diabetes risk counselling Brandy Dunlap was given extended (at least 15 minutes) diabetes prevention counseling today. She is 66 y.o. female and has risk factors for diabetes including obesity. We discussed intensive lifestyle modifications today with an emphasis on weight loss as well as increasing exercise and decreasing simple carbohydrates in her diet.  Obesity Brandy Dunlap is currently in the action stage of change. As such, her goal is to continue with weight loss efforts She has agreed to follow the Category 1 plan +100 calories for sauces with dinner and 1 more cup of vegetables. Brandy Dunlap has been instructed to work up to a goal of 150 minutes of combined cardio and strengthening exercise per week for weight loss and overall health benefits. We discussed the following Behavioral Modification Stratagies today: increasing lean protein intake, decreasing simple carbohydrates  and increasing lower sugar fruits  Brandy Dunlap has agreed to follow up with our clinic in 2 weeks. She was informed of the importance of frequent follow up visits to maximize her success with intensive lifestyle modifications for her multiple health conditions.  I, Doreene Nest, am acting as scribe for Dennard Nip, MD  I have reviewed the above documentation for accuracy and completeness, and I agree with the above. -Dennard Nip, MD

## 2016-05-09 ENCOUNTER — Ambulatory Visit: Payer: Self-pay | Admitting: Family

## 2016-05-16 ENCOUNTER — Encounter (INDEPENDENT_AMBULATORY_CARE_PROVIDER_SITE_OTHER): Payer: Self-pay | Admitting: Family Medicine

## 2016-05-16 ENCOUNTER — Ambulatory Visit (INDEPENDENT_AMBULATORY_CARE_PROVIDER_SITE_OTHER): Payer: Medicare Other | Admitting: Family Medicine

## 2016-05-16 VITALS — BP 144/73 | HR 81 | Temp 98.2°F | Resp 14 | Ht 63.0 in | Wt 188.0 lb

## 2016-05-16 DIAGNOSIS — Z6833 Body mass index (BMI) 33.0-33.9, adult: Secondary | ICD-10-CM

## 2016-05-16 DIAGNOSIS — E669 Obesity, unspecified: Secondary | ICD-10-CM | POA: Diagnosis not present

## 2016-05-16 DIAGNOSIS — R7303 Prediabetes: Secondary | ICD-10-CM

## 2016-05-16 NOTE — Progress Notes (Signed)
Office: [phone]  /  Fax: [phone]   HPI:   Chief Complaint: OBESITY Brandy Dunlap is here to discuss her progress with her obesity treatment plan. She is following her eating plan approximately 95 % of the time and states she is no exercising yet. Brandy Dunlap is doing well with weight loss but somewhat discouraged she isn't losing faster. She would like more breakfast options. Her weight is 188 lb (85.3 kg) today and has had a weight loss of 2 pounds over a period of 2 weeks since her last visit. She has lost 7 lbs since starting treatment with [name].  Pre-Diabetes Brandy Dunlap has a diagnosis of prediabetes based on her elevated Hgb A1c and was informed this puts her at greater risk of developing diabetes. She is taking metformin currently and is doing well., no GI upset and she continues to work on diet and exercise to decrease risk of diabetes. She admits polyphagia and denies nausea or hypoglycemia.   Wt Readings from Last 500 Encounters:  05/16/16 188 lb (85.3 kg)  05/02/16 190 lb (86.2 kg)  04/17/16 195 lb (88.5 kg)  03/23/16 194 lb (88 kg)  03/05/16 194 lb (88 kg)  10/24/15 189 lb 8 oz (86 kg)  06/29/15 200 lb 12.8 oz (91.1 kg)  05/23/15 210 lb (95.3 kg)  04/25/15 207 lb 12.8 oz (94.3 kg)  02/10/15 206 lb (93.4 kg)  12/13/14 206 lb 12.8 oz (93.8 kg)  10/25/14 205 lb 2 oz (93 kg)  10/08/14 200 lb 8 oz (90.9 kg)  07/19/14 201 lb 1.6 oz (91.2 kg)  05/10/14 202 lb (91.6 kg)  03/29/14 201 lb (91.2 kg)  03/24/14 202 lb (91.6 kg)  11/17/13 204 lb (92.5 kg)  11/05/13 204 lb 2 oz (92.6 kg)  10/05/13 209 lb (94.8 kg)  08/07/13 207 lb (93.9 kg)  06/24/13 194 lb (88 kg)  05/25/13 197 lb (89.4 kg)  03/25/13 198 lb (89.8 kg)  02/03/13 201 lb (91.2 kg)  01/25/13 189 lb 6 oz (85.9 kg)  12/24/12 201 lb 3.2 oz (91.3 kg)  11/21/12 202 lb (91.6 kg)  11/06/12 201 lb 12.8 oz (91.5 kg)  10/03/12 203 lb (92.1 kg)  09/18/12 203 lb 9.6 oz (92.4 kg)  09/05/12 203 lb 2 oz (92.1 kg)  08/08/12 201 lb (91.2  kg)  08/05/12 202 lb (91.6 kg)  06/26/12 202 lb 9.6 oz (91.9 kg)  04/23/12 200 lb (90.7 kg)  04/18/12 197 lb (89.4 kg)  01/01/12 202 lb (91.6 kg)  11/23/11 190 lb (86.2 kg)  10/10/11 198 lb (89.8 kg)  10/03/11 196 lb (88.9 kg)  06/06/11 199 lb (90.3 kg)  01/17/11 200 lb (90.7 kg)  09/25/10 200 lb (90.7 kg)  04/21/10 202 lb (91.6 kg)  04/03/10 202 lb (91.6 kg)  03/23/10 197 lb (89.4 kg)  01/18/10 197 lb (89.4 kg)  09/21/09 198 lb (89.8 kg)  06/29/09 200 lb (90.7 kg)  04/05/09 202 lb (91.6 kg)  02/12/09 202 lb (91.6 kg)  09/06/08 195 lb (88.5 kg)  06/16/08 191 lb (86.6 kg)  02/16/08 188 lb (85.3 kg)  12/05/07 190 lb (86.2 kg)  10/08/07 199 lb (90.3 kg)  07/24/07 204 lb (92.5 kg)     ALLERGIES: Allergies  Allergen Reactions  . Pantoprazole Sodium Other (See Comments)    Racing heart    MEDICATIONS: Current Outpatient Prescriptions on File Prior to Visit  Medication Sig Dispense Refill  . amLODipine (NORVASC) 5 MG tablet TAKE 1 TABLET (5 MG TOTAL)  BY MOUTH DAILY. 90 tablet 1  . budesonide-formoterol (SYMBICORT) 160-4.5 MCG/ACT inhaler Inhale 2 puffs into the lungs 2 (two) times daily.    . Cholecalciferol (VITAMIN D3) 50000 units CAPS Take 1 Dose by mouth once a week. 4 capsule 0  . cyclobenzaprine (FLEXERIL) 5 MG tablet Take 1 tablet (5 mg total) by mouth 3 (three) times daily as needed for muscle spasms. (Patient not taking: Reported on 04/17/2016) 20 tablet 0  . DEXILANT 60 MG capsule TAKE ONE CAPSULE EVERY DAY 90 capsule 1  . EPIPEN 2-PAK 0.3 MG/0.3ML SOAJ injection   1  . meloxicam (MOBIC) 7.5 MG tablet Take 1 tablet (7.5 mg total) by mouth daily. (Patient not taking: Reported on 04/17/2016) 14 tablet 0  . metFORMIN (GLUCOPHAGE) 500 MG tablet Take 1 tablet (500 mg total) by mouth daily with breakfast. 30 tablet 0  . mometasone (NASONEX) 50 MCG/ACT nasal spray Place 2 sprays into the nose daily. 17 g 12  . mometasone-formoterol (DULERA) 200-5 MCG/ACT AERO Inhale 1 puff  into the lungs 2 (two) times daily. (Patient not taking: Reported on 05/02/2016)    . Multiple Vitamin (MULTIVITAMIN) capsule Take 1 capsule by mouth daily.      [name] omalizumab (XOLAIR) 150 MG injection Inject 150 mg into the skin every 14 (fourteen) days.    [name] PROAIR RESPICLICK 992 (90 BASE) MCG/ACT AEPB Inhale 2 puffs into the lungs every 6 (six) hours as needed.  0   No current facility-administered medications on file prior to visit.     PAST MEDICAL HISTORY: Past Medical History:  Diagnosis Date  . Allergy   . Asthma   . B12 deficiency   . Back pain   . Bronchitis    hx  . Colon polyp 10/06/2003  . GERD (gastroesophageal reflux disease)   . Hypertension   . Low back pain   . Osteoarthritis   . Sinus trouble     PAST SURGICAL HISTORY: Past Surgical History:  Procedure Laterality Date  . CHOLECYSTECTOMY    . COLONOSCOPY    . COLONOSCOPY W/ BIOPSIES  10/06/2003  . LUMBAR FUSION  2008  . PARTIAL HIP ARTHROPLASTY Right 2009    hip replacement  . TONSILLECTOMY    . TOTAL KNEE ARTHROPLASTY Right 11/16/2013   Procedure: RIGHT TOTAL KNEE ARTHROPLASTY;  Surgeon: Vickey Huger, MD;  Location: [facility];  Service: Orthopedics;  Laterality: Right;  [name] VIDEO BRONCHOSCOPY Bilateral 12/05/2012   Procedure: VIDEO BRONCHOSCOPY WITHOUT FLUORO;  Surgeon: Collene Gobble, MD;  Location: [facility];  Service: Cardiopulmonary;  Laterality: Bilateral;    SOCIAL HISTORY: Social History  Substance Use Topics  . Smoking status: Never Smoker  . Smokeless tobacco: Never Used  . Alcohol use No    FAMILY HISTORY: Family History  Problem Relation Age of Onset  . COPD Father     died at age 71  . Emphysema Father   . Heart Problems Father   . Heart disease Brother     stent with 90%  . COPD Mother   . Heart disease Mother   . Ovarian cancer Mother   . Emphysema Mother   . Hypertension Mother   . Hyperlipidemia Mother   . Heart Problems Mother   . Stroke Mother   . Thyroid disease Mother   .  Cancer Mother   . Colon cancer Neg Hx   . Esophageal cancer Neg Hx   . Rectal cancer Neg Hx   . Stomach cancer Neg Hx  ROS: Review of Systems  Constitutional: Positive for weight loss.  Gastrointestinal: Negative for abdominal pain, nausea and vomiting.  Endo/Heme/Allergies:       Polyphagia Negative hypoglycemia    PHYSICAL EXAM: Blood pressure (!) 144/73, pulse 81, temperature 98.2 F (36.8 C), temperature source Oral, resp. rate 14, height 5\' 3"  (1.6 m), weight 188 lb (85.3 kg), SpO2 96 %. Body mass index is 33.3 kg/m. Physical Exam  Constitutional: She is oriented to person, place, and time. She appears well-developed and well-nourished.  Cardiovascular: Normal rate.   Pulmonary/Chest: Effort normal.  Musculoskeletal: Normal range of motion.  Neurological: She is oriented to person, place, and time.  Skin: Skin is warm and dry.  Psychiatric: She has a normal mood and affect. Her behavior is normal.  Vitals reviewed.   RECENT LABS AND TESTS: BMET    Component Value Date/Time   NA 140 04/17/2016 1150   K 3.8 04/17/2016 1150   CL 103 04/17/2016 1150   CO2 23 04/17/2016 1150   GLUCOSE 83 04/17/2016 1150   GLUCOSE 95 10/24/2015 1054   BUN 10 04/17/2016 1150   CREATININE 0.72 04/17/2016 1150   CALCIUM 8.9 04/17/2016 1150   GFRNONAA 88 04/17/2016 1150   GFRAA 102 04/17/2016 1150   Lab Results  Component Value Date   HGBA1C 5.8 (H) 04/17/2016   Lab Results  Component Value Date   INSULIN 13.9 04/17/2016   CBC    Component Value Date/Time   WBC 5.1 04/17/2016 1150   WBC 6.6 10/24/2015 1054   RBC 4.68 04/17/2016 1150   RBC 4.48 10/24/2015 1054   HGB 12.4 10/24/2015 1054   HCT 39.5 04/17/2016 1150   PLT 266.0 10/24/2015 1054   MCV 84 04/17/2016 1150   MCH 27.1 04/17/2016 1150   MCH 29.3 11/18/2013 0705   MCHC 32.2 04/17/2016 1150   MCHC 33.4 10/24/2015 1054   RDW 16.0 (H) 04/17/2016 1150   LYMPHSABS 2.7 04/17/2016 1150   MONOABS 0.4 10/24/2015 1054    EOSABS 0.2 04/17/2016 1150   BASOSABS 0.0 04/17/2016 1150   Iron/TIBC/Ferritin/ %Sat No results found for: IRON, TIBC, FERRITIN, IRONPCTSAT Lipid Panel     Component Value Date/Time   CHOL 157 04/17/2016 1150   TRIG 222 (H) 04/17/2016 1150   HDL 42 04/17/2016 1150   CHOLHDL 3 10/24/2015 1054   VLDL 21.6 10/24/2015 1054   LDLCALC 71 04/17/2016 1150   LDLDIRECT 149.9 09/11/2010 0908   Hepatic Function Panel     Component Value Date/Time   PROT 6.7 04/17/2016 1150   ALBUMIN 4.3 04/17/2016 1150   AST 28 04/17/2016 1150   ALT 28 04/17/2016 1150   ALKPHOS 78 04/17/2016 1150   BILITOT 0.3 04/17/2016 1150   BILIDIR 0.0 10/24/2015 1054      Component Value Date/Time   TSH 1.890 04/17/2016 1150   TSH 1.15 10/24/2015 1054   TSH 1.30 04/25/2015 1132    ASSESSMENT AND PLAN: Prediabetes  Class 1 obesity without serious comorbidity with body mass index (BMI) of 33.0 to 33.9 in adult, unspecified obesity type  PLAN:  Pre-Diabetes Brandy Dunlap will continue to work on weight loss, exercise, and decreasing simple carbohydrates in her diet to help decrease the risk of diabetes. We dicussed metformin including benefits and risks. She was informed that eating too many simple carbohydrates or too many calories at one sitting increases the likelihood of GI side effects. Brandy Dunlap agrees to continue metformin for now and Brandy Dunlap agreed to follow up with [name] as  directed to monitor her progress.  We spent > than 50% of the 15 minute visit on the counseling as documented in the note.  Obesity Brandy Dunlap is currently in the action stage of change. As such, her goal is to continue with weight loss efforts She has agreed to keep a food journal with 200 to 300 calories and 30 grams of protein at breakfast daily and follow the Category 1 plan Brandy Dunlap has been instructed to work up to a goal of 150 minutes of combined cardio and strengthening exercise per week or continue chair yoga 45 minutes Monday, Tuesday, Friday,  Monday, Tuesday low impact 25 minutes, Wednesday Thursday 30 minutes or walking 45 minutes for weight loss and overall health benefits. We discussed the following Behavioral Modification Stratagies today: increasing lean protein intake, decreasing simple carbohydrates , increasing vegetables, increasing lower sugar fruits and decreasing sodium intake  Brandy Dunlap has agreed to follow up with our clinic in 2 weeks. She was informed of the importance of frequent follow up visits to maximize her success with intensive lifestyle modifications for her multiple health conditions.  I, Doreene Nest, am acting as scribe for Dennard Nip, MD  I have reviewed the above documentation for accuracy and completeness, and I agree with the above. -Dennard Nip, MD

## 2016-05-30 ENCOUNTER — Ambulatory Visit (INDEPENDENT_AMBULATORY_CARE_PROVIDER_SITE_OTHER): Payer: Medicare Other | Admitting: Family Medicine

## 2016-05-30 VITALS — BP 145/73 | HR 73 | Temp 98.6°F | Ht 63.0 in | Wt 185.0 lb

## 2016-05-30 DIAGNOSIS — R7303 Prediabetes: Secondary | ICD-10-CM | POA: Diagnosis not present

## 2016-05-30 DIAGNOSIS — Z9189 Other specified personal risk factors, not elsewhere classified: Secondary | ICD-10-CM

## 2016-05-30 DIAGNOSIS — E669 Obesity, unspecified: Secondary | ICD-10-CM | POA: Diagnosis not present

## 2016-05-30 DIAGNOSIS — E559 Vitamin D deficiency, unspecified: Secondary | ICD-10-CM

## 2016-05-30 DIAGNOSIS — Z6832 Body mass index (BMI) 32.0-32.9, adult: Secondary | ICD-10-CM

## 2016-05-30 MED ORDER — METFORMIN HCL 500 MG PO TABS: 500.0000 mg | ORAL_TABLET | Freq: Every day | ORAL | 0 refills | Status: DC

## 2016-05-30 MED ORDER — VITAMIN D3 1.25 MG (50000 UT) PO CAPS: 1.0000 | ORAL_CAPSULE | ORAL | 0 refills | Status: DC

## 2016-05-30 NOTE — Progress Notes (Signed)
Office: [phone]  /  Fax: [phone]   HPI:   Chief Complaint: OBESITY Brandy Dunlap is here to discuss her progress with her obesity treatment plan. She is following her eating plan approximately 85 % of the time and states she is exercising aerobics, yoga and walking 5 times per week. Brandy Dunlap has done well with weight loss even with some increased celebration and emotional eating but tried to decrease portions and increase protein. She is getting ready to go on vacation and has questions about how to not gain weight while traveling. Her weight is 185 lb (83.9 kg) today and has had a weight loss of 3 pounds over a period of 2 weeks since her last visit. She has lost 10 lbs since starting treatment with [name].  Vitamin D deficiency Brandy Dunlap has a diagnosis of vitamin D deficiency. She is currently stable on vit D but not yet at goal. Fatigue is improving and she denies nausea, vomiting or muscle weakness.  Pre-Diabetes Brandy Dunlap has a diagnosis of prediabetes based on her elevated Hgb A1c and was informed this puts her at greater risk of developing diabetes. She is stable on  metformin currently, polyphagia has decreased and continues to work on diet and exercise to decrease risk of diabetes. She is doing well on diet prescription and exercise, she is losing weight well. She denies nausea, vomiting or hypoglycemia.  At risk for diabetes Brandy Dunlap is at higher than average risk for developing diabetes due to her obesity and pre-diabetes. She currently denies polyuria or polydipsia.  Wt Readings from Last 500 Encounters:  05/30/16 185 lb (83.9 kg)  05/16/16 188 lb (85.3 kg)  05/02/16 190 lb (86.2 kg)  04/17/16 195 lb (88.5 kg)  03/23/16 194 lb (88 kg)  03/05/16 194 lb (88 kg)  10/24/15 189 lb 8 oz (86 kg)  06/29/15 200 lb 12.8 oz (91.1 kg)  05/23/15 210 lb (95.3 kg)  04/25/15 207 lb 12.8 oz (94.3 kg)  02/10/15 206 lb (93.4 kg)  12/13/14 206 lb 12.8 oz (93.8 kg)  10/25/14 205 lb 2 oz (93 kg)  10/08/14 200  lb 8 oz (90.9 kg)  07/19/14 201 lb 1.6 oz (91.2 kg)  05/10/14 202 lb (91.6 kg)  03/29/14 201 lb (91.2 kg)  03/24/14 202 lb (91.6 kg)  11/17/13 204 lb (92.5 kg)  11/05/13 204 lb 2 oz (92.6 kg)  10/05/13 209 lb (94.8 kg)  08/07/13 207 lb (93.9 kg)  06/24/13 194 lb (88 kg)  05/25/13 197 lb (89.4 kg)  03/25/13 198 lb (89.8 kg)  02/03/13 201 lb (91.2 kg)  01/25/13 189 lb 6 oz (85.9 kg)  12/24/12 201 lb 3.2 oz (91.3 kg)  11/21/12 202 lb (91.6 kg)  11/06/12 201 lb 12.8 oz (91.5 kg)  10/03/12 203 lb (92.1 kg)  09/18/12 203 lb 9.6 oz (92.4 kg)  09/05/12 203 lb 2 oz (92.1 kg)  08/08/12 201 lb (91.2 kg)  08/05/12 202 lb (91.6 kg)  06/26/12 202 lb 9.6 oz (91.9 kg)  04/23/12 200 lb (90.7 kg)  04/18/12 197 lb (89.4 kg)  01/01/12 202 lb (91.6 kg)  11/23/11 190 lb (86.2 kg)  10/10/11 198 lb (89.8 kg)  10/03/11 196 lb (88.9 kg)  06/06/11 199 lb (90.3 kg)  01/17/11 200 lb (90.7 kg)  09/25/10 200 lb (90.7 kg)  04/21/10 202 lb (91.6 kg)  04/03/10 202 lb (91.6 kg)  03/23/10 197 lb (89.4 kg)  01/18/10 197 lb (89.4 kg)  09/21/09 198 lb (89.8 kg)  06/29/09 200 lb (90.7 kg)  04/05/09 202 lb (91.6 kg)  02/12/09 202 lb (91.6 kg)  09/06/08 195 lb (88.5 kg)  06/16/08 191 lb (86.6 kg)  02/16/08 188 lb (85.3 kg)  12/05/07 190 lb (86.2 kg)  10/08/07 199 lb (90.3 kg)  07/24/07 204 lb (92.5 kg)     ALLERGIES: Allergies  Allergen Reactions  . Pantoprazole Sodium Other (See Comments)    Racing heart    MEDICATIONS: Current Outpatient Prescriptions on File Prior to Visit  Medication Sig Dispense Refill  . amLODipine (NORVASC) 5 MG tablet TAKE 1 TABLET (5 MG TOTAL) BY MOUTH DAILY. 90 tablet 1  . budesonide-formoterol (SYMBICORT) 160-4.5 MCG/ACT inhaler Inhale 2 puffs into the lungs 2 (two) times daily.    . Cholecalciferol (VITAMIN D3) 50000 units CAPS Take 1 Dose by mouth once a week. 4 capsule 0  . DEXILANT 60 MG capsule TAKE ONE CAPSULE EVERY DAY 90 capsule 1  . EPIPEN 2-PAK 0.3 MG/0.3ML  SOAJ injection   1  . metFORMIN (GLUCOPHAGE) 500 MG tablet Take 1 tablet (500 mg total) by mouth daily with breakfast. 30 tablet 0  . mometasone (NASONEX) 50 MCG/ACT nasal spray Place 2 sprays into the nose daily. 17 g 12  . Multiple Vitamin (MULTIVITAMIN) capsule Take 1 capsule by mouth daily.      [name] omalizumab (XOLAIR) 150 MG injection Inject 150 mg into the skin every 14 (fourteen) days.    [name] PROAIR RESPICLICK 470 (90 BASE) MCG/ACT AEPB Inhale 2 puffs into the lungs every 6 (six) hours as needed.  0   No current facility-administered medications on file prior to visit.     PAST MEDICAL HISTORY: Past Medical History:  Diagnosis Date  . Allergy   . Asthma   . B12 deficiency   . Back pain   . Bronchitis    hx  . Colon polyp 10/06/2003  . GERD (gastroesophageal reflux disease)   . Hypertension   . Low back pain   . Osteoarthritis   . Sinus trouble     PAST SURGICAL HISTORY: Past Surgical History:  Procedure Laterality Date  . CHOLECYSTECTOMY    . COLONOSCOPY    . COLONOSCOPY W/ BIOPSIES  10/06/2003  . LUMBAR FUSION  2008  . PARTIAL HIP ARTHROPLASTY Right 2009    hip replacement  . TONSILLECTOMY    . TOTAL KNEE ARTHROPLASTY Right 11/16/2013   Procedure: RIGHT TOTAL KNEE ARTHROPLASTY;  Surgeon: Vickey Huger, MD;  Location: [facility];  Service: Orthopedics;  Laterality: Right;  [name] VIDEO BRONCHOSCOPY Bilateral 12/05/2012   Procedure: VIDEO BRONCHOSCOPY WITHOUT FLUORO;  Surgeon: Collene Gobble, MD;  Location: [facility];  Service: Cardiopulmonary;  Laterality: Bilateral;    SOCIAL HISTORY: Social History  Substance Use Topics  . Smoking status: Never Smoker  . Smokeless tobacco: Never Used  . Alcohol use No    FAMILY HISTORY: Family History  Problem Relation Age of Onset  . COPD Father     died at age 35  . Emphysema Father   . Heart Problems Father   . Heart disease Brother     stent with 90%  . COPD Mother   . Heart disease Mother   . Ovarian cancer Mother   .  Emphysema Mother   . Hypertension Mother   . Hyperlipidemia Mother   . Heart Problems Mother   . Stroke Mother   . Thyroid disease Mother   . Cancer Mother   . Colon cancer Neg Hx   .  Esophageal cancer Neg Hx   . Rectal cancer Neg Hx   . Stomach cancer Neg Hx     ROS: Review of Systems  Constitutional: Positive for malaise/fatigue.  Gastrointestinal: Negative for nausea and vomiting.  Genitourinary: Negative for frequency.  Endo/Heme/Allergies: Negative for polydipsia.       Polyphagia Negative hypoglycemia    PHYSICAL EXAM: Blood pressure (!) 145/73, pulse 73, temperature 98.6 F (37 C), temperature source Oral, height 5\' 3"  (1.6 m), weight 185 lb (83.9 kg), SpO2 96 %. Body mass index is 32.77 kg/m. Physical Exam  Constitutional: She is oriented to person, place, and time. She appears well-developed and well-nourished.  Cardiovascular: Normal rate.   Pulmonary/Chest: Effort normal.  Musculoskeletal: Normal range of motion.  Neurological: She is oriented to person, place, and time.  Skin: Skin is warm and dry.  Psychiatric: She has a normal mood and affect. Her behavior is normal.  Vitals reviewed.   RECENT LABS AND TESTS: BMET    Component Value Date/Time   NA 140 04/17/2016 1150   K 3.8 04/17/2016 1150   CL 103 04/17/2016 1150   CO2 23 04/17/2016 1150   GLUCOSE 83 04/17/2016 1150   GLUCOSE 95 10/24/2015 1054   BUN 10 04/17/2016 1150   CREATININE 0.72 04/17/2016 1150   CALCIUM 8.9 04/17/2016 1150   GFRNONAA 88 04/17/2016 1150   GFRAA 102 04/17/2016 1150   Lab Results  Component Value Date   HGBA1C 5.8 (H) 04/17/2016   Lab Results  Component Value Date   INSULIN 13.9 04/17/2016   CBC    Component Value Date/Time   WBC 5.1 04/17/2016 1150   WBC 6.6 10/24/2015 1054   RBC 4.68 04/17/2016 1150   RBC 4.48 10/24/2015 1054   HGB 12.4 10/24/2015 1054   HCT 39.5 04/17/2016 1150   PLT 266.0 10/24/2015 1054   MCV 84 04/17/2016 1150   MCH 27.1 04/17/2016  1150   MCH 29.3 11/18/2013 0705   MCHC 32.2 04/17/2016 1150   MCHC 33.4 10/24/2015 1054   RDW 16.0 (H) 04/17/2016 1150   LYMPHSABS 2.7 04/17/2016 1150   MONOABS 0.4 10/24/2015 1054   EOSABS 0.2 04/17/2016 1150   BASOSABS 0.0 04/17/2016 1150   Iron/TIBC/Ferritin/ %Sat No results found for: IRON, TIBC, FERRITIN, IRONPCTSAT Lipid Panel     Component Value Date/Time   CHOL 157 04/17/2016 1150   TRIG 222 (H) 04/17/2016 1150   HDL 42 04/17/2016 1150   CHOLHDL 3 10/24/2015 1054   VLDL 21.6 10/24/2015 1054   LDLCALC 71 04/17/2016 1150   LDLDIRECT 149.9 09/11/2010 0908   Hepatic Function Panel     Component Value Date/Time   PROT 6.7 04/17/2016 1150   ALBUMIN 4.3 04/17/2016 1150   AST 28 04/17/2016 1150   ALT 28 04/17/2016 1150   ALKPHOS 78 04/17/2016 1150   BILITOT 0.3 04/17/2016 1150   BILIDIR 0.0 10/24/2015 1054      Component Value Date/Time   TSH 1.890 04/17/2016 1150   TSH 1.15 10/24/2015 1054   TSH 1.30 04/25/2015 1132    ASSESSMENT AND PLAN: Vitamin D deficiency - Plan: Cholecalciferol (VITAMIN D3) 50000 units CAPS  Prediabetes - Plan: metFORMIN (GLUCOPHAGE) 500 MG tablet  At risk for diabetes mellitus  Class 1 obesity without serious comorbidity with body mass index (BMI) of 32.0 to 32.9 in adult, unspecified obesity type  PLAN:  Vitamin D Deficiency Brandy Dunlap was informed that low vitamin D levels contributes to fatigue and are associated with obesity, breast, and  colon cancer. She agrees to continue to take prescription Vit D @50 ,000 IU every week, we will refill for 1 month and will follow up for routine testing of vitamin D, at least 2-3 times per year. She was informed of the risk of over-replacement of vitamin D and agrees to not increase her dose unless he discusses this with [name] first. Brandy Dunlap agrees to follow up with our clinic in 2 weeks.  Pre-Diabetes Brandy Dunlap will continue to work on weight loss, exercise, and decreasing simple carbohydrates in her diet to help  decrease the risk of diabetes. We dicussed metformin including benefits and risks. She was informed that eating too many simple carbohydrates or too many calories at one sitting increases the likelihood of GI side effects. Brandy Dunlap agrees to continue to take metformin for now and a prescription was written today for 1 month refill. Brandy Dunlap agreed to follow up with [name] as directed to monitor her progress.  Diabetes risk counselling Brandy Dunlap was given extended (at least 15 minutes) diabetes prevention counseling today. She is 66 y.o. female and has risk factors for diabetes including obesity. We discussed intensive lifestyle modifications today with an emphasis on weight loss as well as increasing exercise and decreasing simple carbohydrates in her diet.  Obesity Brandy Dunlap is currently in the action stage of change. As such, her goal is to continue with weight loss efforts She has agreed to keep a food journal with 1100 to 1300 calories and 70+ grams of protein daily Brandy Dunlap has been instructed to work up to a goal of 150 minutes of combined cardio and strengthening exercise per week or continue aerobics, yoga, walking 5 times per week for weight loss and overall health benefits. We discussed the following Behavioral Modification Stratagies today: increasing lean protein intake, work on meal planning and easy cooking plans, dealing with family or coworker sabotage and holiday eating strategies   Brandy Dunlap has agreed to follow up with our clinic in 2 weeks. She was informed of the importance of frequent follow up visits to maximize her success with intensive lifestyle modifications for her multiple health conditions.  I, Doreene Nest, am acting as scribe for Dennard Nip, MD  I have reviewed the above documentation for accuracy and completeness, and I agree with the above. -Dennard Nip, MD

## 2016-06-07 ENCOUNTER — Encounter: Payer: Self-pay | Admitting: Family

## 2016-06-08 MED ORDER — DEXLANSOPRAZOLE 60 MG PO CPDR: 1.0000 | DELAYED_RELEASE_CAPSULE | Freq: Every day | ORAL | 0 refills | Status: DC

## 2016-06-19 ENCOUNTER — Ambulatory Visit (INDEPENDENT_AMBULATORY_CARE_PROVIDER_SITE_OTHER): Payer: Medicare Other | Admitting: Family Medicine

## 2016-06-19 VITALS — BP 157/77 | HR 79 | Temp 98.4°F | Ht 63.0 in | Wt 186.0 lb

## 2016-06-19 DIAGNOSIS — E669 Obesity, unspecified: Secondary | ICD-10-CM

## 2016-06-19 DIAGNOSIS — E559 Vitamin D deficiency, unspecified: Secondary | ICD-10-CM | POA: Diagnosis not present

## 2016-06-19 DIAGNOSIS — Z6833 Body mass index (BMI) 33.0-33.9, adult: Secondary | ICD-10-CM

## 2016-06-19 MED ORDER — VITAMIN D3 1.25 MG (50000 UT) PO CAPS: 1.0000 | ORAL_CAPSULE | ORAL | 0 refills | Status: DC

## 2016-06-19 NOTE — Progress Notes (Signed)
Office: [phone]  /  Fax: [phone]   HPI:   Chief Complaint: OBESITY Brandy Dunlap is here to discuss her progress with her obesity treatment plan. She is following her eating plan approximately 70 % of the time and states she is exercising 45 minutes 5 times per week. Brandy Dunlap had increased travelling and increased eating out. She didn't journal, mostly tried to portion control/smart choices. She is going to continue to travel the next 1 to 2 weeks. Her weight is 186 lb (84.4 kg) today and has had a weight gain of 1 lb over a period of 3 weeks since her last visit. She has lost 9 lbs since starting treatment with [name].  Vitamin D deficiency [name] has a diagnosis of vitamin D deficiency. She is currently taking vit D, not yet at goal and denies nausea, vomiting or muscle weakness.  Wt Readings from Last 500 Encounters:  06/19/16 186 lb (84.4 kg)  05/30/16 185 lb (83.9 kg)  05/16/16 188 lb (85.3 kg)  05/02/16 190 lb (86.2 kg)  04/17/16 195 lb (88.5 kg)  03/23/16 194 lb (88 kg)  03/05/16 194 lb (88 kg)  10/24/15 189 lb 8 oz (86 kg)  06/29/15 200 lb 12.8 oz (91.1 kg)  05/23/15 210 lb (95.3 kg)  04/25/15 207 lb 12.8 oz (94.3 kg)  02/10/15 206 lb (93.4 kg)  12/13/14 206 lb 12.8 oz (93.8 kg)  10/25/14 205 lb 2 oz (93 kg)  10/08/14 200 lb 8 oz (90.9 kg)  07/19/14 201 lb 1.6 oz (91.2 kg)  05/10/14 202 lb (91.6 kg)  03/29/14 201 lb (91.2 kg)  03/24/14 202 lb (91.6 kg)  11/17/13 204 lb (92.5 kg)  11/05/13 204 lb 2 oz (92.6 kg)  10/05/13 209 lb (94.8 kg)  08/07/13 207 lb (93.9 kg)  06/24/13 194 lb (88 kg)  05/25/13 197 lb (89.4 kg)  03/25/13 198 lb (89.8 kg)  02/03/13 201 lb (91.2 kg)  01/25/13 189 lb 6 oz (85.9 kg)  12/24/12 201 lb 3.2 oz (91.3 kg)  11/21/12 202 lb (91.6 kg)  11/06/12 201 lb 12.8 oz (91.5 kg)  10/03/12 203 lb (92.1 kg)  09/18/12 203 lb 9.6 oz (92.4 kg)  09/05/12 203 lb 2 oz (92.1 kg)  08/08/12 201 lb (91.2 kg)  08/05/12 202 lb (91.6 kg)  06/26/12 202 lb 9.6 oz  (91.9 kg)  04/23/12 200 lb (90.7 kg)  04/18/12 197 lb (89.4 kg)  01/01/12 202 lb (91.6 kg)  11/23/11 190 lb (86.2 kg)  10/10/11 198 lb (89.8 kg)  10/03/11 196 lb (88.9 kg)  06/06/11 199 lb (90.3 kg)  01/17/11 200 lb (90.7 kg)  09/25/10 200 lb (90.7 kg)  04/21/10 202 lb (91.6 kg)  04/03/10 202 lb (91.6 kg)  03/23/10 197 lb (89.4 kg)  01/18/10 197 lb (89.4 kg)  09/21/09 198 lb (89.8 kg)  06/29/09 200 lb (90.7 kg)  04/05/09 202 lb (91.6 kg)  02/12/09 202 lb (91.6 kg)  09/06/08 195 lb (88.5 kg)  06/16/08 191 lb (86.6 kg)  02/16/08 188 lb (85.3 kg)  12/05/07 190 lb (86.2 kg)  10/08/07 199 lb (90.3 kg)  07/24/07 204 lb (92.5 kg)     ALLERGIES: Allergies  Allergen Reactions  . Pantoprazole Sodium Other (See Comments)    Racing heart    MEDICATIONS: Current Outpatient Prescriptions on File Prior to Visit  Medication Sig Dispense Refill  . amLODipine (NORVASC) 5 MG tablet TAKE 1 TABLET (5 MG TOTAL) BY MOUTH DAILY. 90 tablet 1  .  budesonide-formoterol (SYMBICORT) 160-4.5 MCG/ACT inhaler Inhale 2 puffs into the lungs 2 (two) times daily.    [name] dexlansoprazole (DEXILANT) 60 MG capsule Take 1 capsule (60 mg total) by mouth daily. 90 capsule 0  . EPIPEN 2-PAK 0.3 MG/0.3ML SOAJ injection   1  . metFORMIN (GLUCOPHAGE) 500 MG tablet Take 1 tablet (500 mg total) by mouth daily with breakfast. 30 tablet 0  . mometasone (NASONEX) 50 MCG/ACT nasal spray Place 2 sprays into the nose daily. 17 g 12  . Multiple Vitamin (MULTIVITAMIN) capsule Take 1 capsule by mouth daily.      [name] omalizumab (XOLAIR) 150 MG injection Inject 150 mg into the skin every 14 (fourteen) days.    [name] PROAIR RESPICLICK 009 (90 BASE) MCG/ACT AEPB Inhale 2 puffs into the lungs every 6 (six) hours as needed.  0   No current facility-administered medications on file prior to visit.     PAST MEDICAL HISTORY: Past Medical History:  Diagnosis Date  . Allergy   . Asthma   . B12 deficiency   . Back pain   . Bronchitis     hx  . Colon polyp 10/06/2003  . GERD (gastroesophageal reflux disease)   . Hypertension   . Low back pain   . Osteoarthritis   . Sinus trouble     PAST SURGICAL HISTORY: Past Surgical History:  Procedure Laterality Date  . CHOLECYSTECTOMY    . COLONOSCOPY    . COLONOSCOPY W/ BIOPSIES  10/06/2003  . LUMBAR FUSION  2008  . PARTIAL HIP ARTHROPLASTY Right 2009    hip replacement  . TONSILLECTOMY    . TOTAL KNEE ARTHROPLASTY Right 11/16/2013   Procedure: RIGHT TOTAL KNEE ARTHROPLASTY;  Surgeon: Vickey Huger, MD;  Location: [place];  Service: Orthopedics;  Laterality: Right;  [name] VIDEO BRONCHOSCOPY Bilateral 12/05/2012   Procedure: VIDEO BRONCHOSCOPY WITHOUT FLUORO;  Surgeon: Collene Gobble, MD;  Location: [facility];  Service: Cardiopulmonary;  Laterality: Bilateral;    SOCIAL HISTORY: Social History  Substance Use Topics  . Smoking status: Never Smoker  . Smokeless tobacco: Never Used  . Alcohol use No    FAMILY HISTORY: Family History  Problem Relation Age of Onset  . COPD Father     died at age 58  . Emphysema Father   . Heart Problems Father   . Heart disease Brother     stent with 90%  . COPD Mother   . Heart disease Mother   . Ovarian cancer Mother   . Emphysema Mother   . Hypertension Mother   . Hyperlipidemia Mother   . Heart Problems Mother   . Stroke Mother   . Thyroid disease Mother   . Cancer Mother   . Colon cancer Neg Hx   . Esophageal cancer Neg Hx   . Rectal cancer Neg Hx   . Stomach cancer Neg Hx     ROS: Review of Systems  Constitutional: Negative for weight loss.  Gastrointestinal: Negative for nausea and vomiting.  Musculoskeletal:       Negative muscle weakness    PHYSICAL EXAM: Blood pressure (!) 157/77, pulse 79, temperature 98.4 F (36.9 C), temperature source Oral, height 5\' 3"  (1.6 m), weight 186 lb (84.4 kg), SpO2 98 %. Body mass index is 32.95 kg/m. Physical Exam  Constitutional: She is oriented to person, place, and time. She  appears well-developed and well-nourished.  Cardiovascular: Normal rate.   Pulmonary/Chest: Effort normal.  Musculoskeletal: Normal range of motion.  Neurological: She is  oriented to person, place, and time.  Skin: Skin is warm and dry.  Psychiatric: She has a normal mood and affect. Her behavior is normal.  Vitals reviewed.   RECENT LABS AND TESTS: BMET    Component Value Date/Time   NA 140 04/17/2016 1150   K 3.8 04/17/2016 1150   CL 103 04/17/2016 1150   CO2 23 04/17/2016 1150   GLUCOSE 83 04/17/2016 1150   GLUCOSE 95 10/24/2015 1054   BUN 10 04/17/2016 1150   CREATININE 0.72 04/17/2016 1150   CALCIUM 8.9 04/17/2016 1150   GFRNONAA 88 04/17/2016 1150   GFRAA 102 04/17/2016 1150   Lab Results  Component Value Date   HGBA1C 5.8 (H) 04/17/2016   Lab Results  Component Value Date   INSULIN 13.9 04/17/2016   CBC    Component Value Date/Time   WBC 5.1 04/17/2016 1150   WBC 6.6 10/24/2015 1054   RBC 4.68 04/17/2016 1150   RBC 4.48 10/24/2015 1054   HGB 12.4 10/24/2015 1054   HCT 39.5 04/17/2016 1150   PLT 266.0 10/24/2015 1054   MCV 84 04/17/2016 1150   MCH 27.1 04/17/2016 1150   MCH 29.3 11/18/2013 0705   MCHC 32.2 04/17/2016 1150   MCHC 33.4 10/24/2015 1054   RDW 16.0 (H) 04/17/2016 1150   LYMPHSABS 2.7 04/17/2016 1150   MONOABS 0.4 10/24/2015 1054   EOSABS 0.2 04/17/2016 1150   BASOSABS 0.0 04/17/2016 1150   Iron/TIBC/Ferritin/ %Sat No results found for: IRON, TIBC, FERRITIN, IRONPCTSAT Lipid Panel     Component Value Date/Time   CHOL 157 04/17/2016 1150   TRIG 222 (H) 04/17/2016 1150   HDL 42 04/17/2016 1150   CHOLHDL 3 10/24/2015 1054   VLDL 21.6 10/24/2015 1054   LDLCALC 71 04/17/2016 1150   LDLDIRECT 149.9 09/11/2010 0908   Hepatic Function Panel     Component Value Date/Time   PROT 6.7 04/17/2016 1150   ALBUMIN 4.3 04/17/2016 1150   AST 28 04/17/2016 1150   ALT 28 04/17/2016 1150   ALKPHOS 78 04/17/2016 1150   BILITOT 0.3 04/17/2016 1150    BILIDIR 0.0 10/24/2015 1054      Component Value Date/Time   TSH 1.890 04/17/2016 1150   TSH 1.15 10/24/2015 1054   TSH 1.30 04/25/2015 1132    ASSESSMENT AND PLAN: Vitamin D deficiency - Plan: Cholecalciferol (VITAMIN D3) 50000 units CAPS  Class 1 obesity without serious comorbidity with body mass index (BMI) of 33.0 to 33.9 in adult, unspecified obesity type  PLAN:  Vitamin D Deficiency Lakrista was informed that low vitamin D levels contributes to fatigue and are associated with obesity, breast, and colon cancer. She agrees to continue to take prescription Vit D @50 ,000 IU every week, we will refill for 1 month and will re-check labs in 1 month and follow up for routine testing of vitamin D, at least 2-3 times per year. She was informed of the risk of over-replacement of vitamin D and agrees to not increase her dose unless he discusses this with [name] first. Jasmain agrees to follow up with our clinic in 2 weeks.  Obesity Dasani is currently in the action stage of change. As such, her goal is to continue with weight loss efforts She has agreed to follow a lower carbohydrate, vegetable and lean protein rich diet plan while travelling Bertie has been instructed to work up to a goal of 150 minutes of combined cardio and strengthening exercise per week for weight loss and overall health benefits.  We discussed the following Behavioral Modification Stratagies today: increasing lean protein intake, decreasing simple carbohydrates  and travel eating strategies   Angelle has agreed to follow up with our clinic in 2 weeks. She was informed of the importance of frequent follow up visits to maximize her success with intensive lifestyle modifications for her multiple health conditions.  I, Doreene Nest, am acting as scribe for Dennard Nip, MD  I have reviewed the above documentation for accuracy and completeness, and I agree with the above. -Dennard Nip, MD

## 2016-06-22 DIAGNOSIS — M545 Low back pain, unspecified: Secondary | ICD-10-CM | POA: Insufficient documentation

## 2016-07-05 ENCOUNTER — Encounter: Payer: Self-pay | Admitting: Family Medicine

## 2016-07-05 ENCOUNTER — Ambulatory Visit (INDEPENDENT_AMBULATORY_CARE_PROVIDER_SITE_OTHER): Payer: Medicare Other | Admitting: Family Medicine

## 2016-07-05 VITALS — BP 140/80 | HR 66 | Temp 98.2°F | Ht 63.0 in | Wt 185.0 lb

## 2016-07-05 DIAGNOSIS — H00021 Hordeolum internum right upper eyelid: Secondary | ICD-10-CM | POA: Diagnosis not present

## 2016-07-05 MED ORDER — ERYTHROMYCIN 5 MG/GM OP OINT: 1.0000 "application " | TOPICAL_OINTMENT | Freq: Every day | OPHTHALMIC | 0 refills | Status: DC

## 2016-07-05 NOTE — Progress Notes (Signed)
Chief Complaint  Patient presents with  . Eye Problem    swelling and pain-started on yest    Brandy Dunlap is here for right eye irritation.  Duration: 1 d Chemical exposure? No  Recent URI? No  Contact lenses? No  History of allergies? No  Treatment to date: Warm compresses Associated symptoms include headache, some blurred vision, and swelling of upper lid Denies fevers, FB sensation, eye pain  ROS:  Eyes: As noted above  Past Medical History:  Diagnosis Date  . Allergy   . Asthma   . B12 deficiency   . Back pain   . Bronchitis    hx  . Colon polyp 10/06/2003  . GERD (gastroesophageal reflux disease)   . Hypertension   . Low back pain   . Osteoarthritis   . Sinus trouble    Family History  Problem Relation Age of Onset  . COPD Father        died at age 67  . Emphysema Father   . Heart Problems Father   . Heart disease Brother        stent with 90%  . COPD Mother   . Heart disease Mother   . Ovarian cancer Mother   . Emphysema Mother   . Hypertension Mother   . Hyperlipidemia Mother   . Heart Problems Mother   . Stroke Mother   . Thyroid disease Mother   . Cancer Mother   . Colon cancer Neg Hx   . Esophageal cancer Neg Hx   . Rectal cancer Neg Hx   . Stomach cancer Neg Hx     BP 140/80 (BP Location: Left Arm, Patient Position: Sitting, Cuff Size: Normal)   Pulse 66   Temp 98.2 F (36.8 C) (Oral)   Ht 5\' 3"  (1.6 m)   Wt 185 lb (83.9 kg) Comment: Per pt weight in on Monday  SpO2 98%   BMI 32.77 kg/m  Gen: Awake, alert, appears stated age Eyes: Lids neg on L and R lower, R upper is swollen, TTP, there is a bump on the conjunctiva suggestive of a hordeolum, no fluctuance, Sclera white, PERRLA, EOMi; fluorescent exam neg Nose: Nares patent without discharge Psych: Age appropriate judgment and insight; mood and affect normal  Hordeolum internum of right upper eyelid - Plan: erythromycin ophthalmic ointment  Orders as above. Artificial tears  first line and warm compression, ointment for lubrication if no improvement. F/u prn. Pt voiced understanding and agreement to the plan.  [place], DO 07/05/16 11:10 AM

## 2016-07-05 NOTE — Patient Instructions (Addendum)
Continue warm compresses.  Artificial tears (like refresh tears) are available over the counter. Normally people use every 3-4 hours, but you can use this as much as you wish.  Fevers, worsening symptoms, worsening vision then seek care.

## 2016-07-09 ENCOUNTER — Ambulatory Visit (INDEPENDENT_AMBULATORY_CARE_PROVIDER_SITE_OTHER): Payer: Medicare Other | Admitting: Family Medicine

## 2016-07-09 VITALS — BP 122/75 | HR 75 | Temp 98.6°F | Ht 63.0 in | Wt 184.0 lb

## 2016-07-09 DIAGNOSIS — Z9189 Other specified personal risk factors, not elsewhere classified: Secondary | ICD-10-CM

## 2016-07-09 DIAGNOSIS — E669 Obesity, unspecified: Secondary | ICD-10-CM

## 2016-07-09 DIAGNOSIS — R7303 Prediabetes: Secondary | ICD-10-CM

## 2016-07-09 DIAGNOSIS — Z6832 Body mass index (BMI) 32.0-32.9, adult: Secondary | ICD-10-CM

## 2016-07-09 HISTORY — DX: Prediabetes: R73.03

## 2016-07-09 MED ORDER — METFORMIN HCL 500 MG PO TABS: 500.0000 mg | ORAL_TABLET | Freq: Every day | ORAL | 0 refills | Status: DC

## 2016-07-09 NOTE — Progress Notes (Signed)
Office: [phone]  /  Fax: [phone]   HPI:   Chief Complaint: OBESITY Brandy Dunlap is here to discuss her progress with her obesity treatment plan. She is on the lower carbohydrate, vegetable and lean protein rich diet plan and is following her eating plan approximately 75 % of the time. She states she is exercising aerobics, yoga and walking 45 minutes 3 times per week. Marlei continues to do well with weight loss, even on vacation. She increased walking on vacation but had also had increased temptations. She feels bloated when she increases simple carbohydrates and is ready to get back on track. Her weight is 184 lb (83.5 kg) today and has had a weight loss of 2 pounds over a period of 3 weeks since her last visit. She has lost 11 lbs since starting treatment with [name].  Pre-Diabetes Brandy Dunlap has a diagnosis of pre-diabetes based on her elevated Hgb A1c and was informed this puts her at greater risk of developing diabetes. She is stable on metformin currently and continues to work on diet and exercise to decrease risk of diabetes. She denies nausea or hypoglycemia.  At risk for diabetes Brandy Dunlap is at higher than average risk for developing diabetes due to her obesity and pre-diabetes. She currently denies polyuria or polydipsia.   ALLERGIES: Allergies  Allergen Reactions   Pantoprazole Sodium Other (See Comments)    Racing heart    MEDICATIONS: Current Outpatient Prescriptions on File Prior to Visit  Medication Sig Dispense Refill   amLODipine (NORVASC) 5 MG tablet TAKE 1 TABLET (5 MG TOTAL) BY MOUTH DAILY. 90 tablet 1   budesonide-formoterol (SYMBICORT) 160-4.5 MCG/ACT inhaler Inhale 2 puffs into the lungs 2 (two) times daily.     Cholecalciferol (VITAMIN D3) 50000 units CAPS Take 1 Dose by mouth once a week. 4 capsule 0   dexlansoprazole (DEXILANT) 60 MG capsule Take 1 capsule (60 mg total) by mouth daily. 90 capsule 0   diclofenac sodium (VOLTAREN) 1 % GEL APPLY (2G) BY TOPICAL  ROUTE 4 TIMES EVERY DAY TO THE AFFECTED AREA(S)  1   EPIPEN 2-PAK 0.3 MG/0.3ML SOAJ injection   1   erythromycin ophthalmic ointment Place 1 application into the right eye at bedtime. 3.5 g 0   ibuprofen (ADVIL,MOTRIN) 800 MG tablet TAKE 1 TABLET BY MOUTH 3 TIMES A DAY WITH MEALS X 14 DAYS  0   mometasone (NASONEX) 50 MCG/ACT nasal spray Place 2 sprays into the nose daily. 17 g 12   Multiple Vitamin (MULTIVITAMIN) capsule Take 1 capsule by mouth daily.       omalizumab (XOLAIR) 150 MG injection Inject 150 mg into the skin every 14 (fourteen) days.     PROAIR RESPICLICK 712 (90 BASE) MCG/ACT AEPB Inhale 2 puffs into the lungs every 6 (six) hours as needed.  0   traMADol (ULTRAM) 50 MG tablet TAKE 1-2 TABLET BY ORAL ROUTE EVERY 6 HOURS AS NEEDED  1   No current facility-administered medications on file prior to visit.     PAST MEDICAL HISTORY: Past Medical History:  Diagnosis Date   Allergy    Asthma    B12 deficiency    Back pain    Bronchitis    hx   Colon polyp 10/06/2003   GERD (gastroesophageal reflux disease)    Hypertension    Low back pain    Osteoarthritis    Sinus trouble     PAST SURGICAL HISTORY: Past Surgical History:  Procedure Laterality Date   CHOLECYSTECTOMY  COLONOSCOPY     COLONOSCOPY W/ BIOPSIES  10/06/2003   LUMBAR FUSION  2008   PARTIAL HIP ARTHROPLASTY Right 2009    hip replacement   TONSILLECTOMY     TOTAL KNEE ARTHROPLASTY Right 11/16/2013   Procedure: RIGHT TOTAL KNEE ARTHROPLASTY;  Surgeon: Vickey Huger, MD;  Location: [place];  Service: Orthopedics;  Laterality: Right;   VIDEO BRONCHOSCOPY Bilateral 12/05/2012   Procedure: VIDEO BRONCHOSCOPY WITHOUT FLUORO;  Surgeon: Collene Gobble, MD;  Location: [facility];  Service: Cardiopulmonary;  Laterality: Bilateral;    SOCIAL HISTORY: Social History  Substance Use Topics   Smoking status: Never Smoker   Smokeless tobacco: Never Used   Alcohol use No    FAMILY  HISTORY: Family History  Problem Relation Age of Onset   COPD Father        died at age 55   Emphysema Father    Heart Problems Father    Heart disease Brother        stent with 90%   COPD Mother    Heart disease Mother    Ovarian cancer Mother    Emphysema Mother    Hypertension Mother    Hyperlipidemia Mother    Heart Problems Mother    Stroke Mother    Thyroid disease Mother    Cancer Mother    Colon cancer Neg Hx    Esophageal cancer Neg Hx    Rectal cancer Neg Hx    Stomach cancer Neg Hx     ROS: Review of Systems  Constitutional: Positive for weight loss.  Gastrointestinal: Negative for nausea.  Genitourinary: Negative for frequency.  Endo/Heme/Allergies: Negative for polydipsia.       Negative hypoglycemia    PHYSICAL EXAM: Blood pressure 122/75, pulse 75, temperature 98.6 F (37 C), temperature source Oral, height 5\' 3"  (1.6 m), weight 184 lb (83.5 kg), SpO2 97 %. Body mass index is 32.59 kg/m. Physical Exam  Constitutional: She is oriented to person, place, and time. She appears well-developed and well-nourished.  Cardiovascular: Normal rate.   Pulmonary/Chest: Effort normal.  Musculoskeletal: Normal range of motion.  Neurological: She is oriented to person, place, and time.  Skin: Skin is warm and dry.  Psychiatric: She has a normal mood and affect. Her behavior is normal.  Vitals reviewed.   RECENT LABS AND TESTS: BMET    Component Value Date/Time   NA 140 04/17/2016 1150   K 3.8 04/17/2016 1150   CL 103 04/17/2016 1150   CO2 23 04/17/2016 1150   GLUCOSE 83 04/17/2016 1150   GLUCOSE 95 10/24/2015 1054   BUN 10 04/17/2016 1150   CREATININE 0.72 04/17/2016 1150   CALCIUM 8.9 04/17/2016 1150   GFRNONAA 88 04/17/2016 1150   GFRAA 102 04/17/2016 1150   Lab Results  Component Value Date   HGBA1C 5.8 (H) 04/17/2016   Lab Results  Component Value Date   INSULIN 13.9 04/17/2016   CBC    Component Value Date/Time   WBC  5.1 04/17/2016 1150   WBC 6.6 10/24/2015 1054   RBC 4.68 04/17/2016 1150   RBC 4.48 10/24/2015 1054   HGB 12.4 10/24/2015 1054   HCT 39.5 04/17/2016 1150   PLT 266.0 10/24/2015 1054   MCV 84 04/17/2016 1150   MCH 27.1 04/17/2016 1150   MCH 29.3 11/18/2013 0705   MCHC 32.2 04/17/2016 1150   MCHC 33.4 10/24/2015 1054   RDW 16.0 (H) 04/17/2016 1150   LYMPHSABS 2.7 04/17/2016 1150   MONOABS 0.4 10/24/2015  1054   EOSABS 0.2 04/17/2016 1150   BASOSABS 0.0 04/17/2016 1150   Iron/TIBC/Ferritin/ %Sat No results found for: IRON, TIBC, FERRITIN, IRONPCTSAT Lipid Panel     Component Value Date/Time   CHOL 157 04/17/2016 1150   TRIG 222 (H) 04/17/2016 1150   HDL 42 04/17/2016 1150   CHOLHDL 3 10/24/2015 1054   VLDL 21.6 10/24/2015 1054   LDLCALC 71 04/17/2016 1150   LDLDIRECT 149.9 09/11/2010 0908   Hepatic Function Panel     Component Value Date/Time   PROT 6.7 04/17/2016 1150   ALBUMIN 4.3 04/17/2016 1150   AST 28 04/17/2016 1150   ALT 28 04/17/2016 1150   ALKPHOS 78 04/17/2016 1150   BILITOT 0.3 04/17/2016 1150   BILIDIR 0.0 10/24/2015 1054      Component Value Date/Time   TSH 1.890 04/17/2016 1150   TSH 1.15 10/24/2015 1054   TSH 1.30 04/25/2015 1132    ASSESSMENT AND PLAN: Prediabetes - Plan: metFORMIN (GLUCOPHAGE) 500 MG tablet  At risk for diabetes mellitus  Class 1 obesity without serious comorbidity with body mass index (BMI) of 32.0 to 32.9 in adult, unspecified obesity type  PLAN:  Pre-Diabetes Brandy Dunlap will continue to work on weight loss, exercise, and decreasing simple carbohydrates in her diet to help decrease the risk of diabetes. We dicussed metformin including benefits and risks. She was informed that eating too many simple carbohydrates or too many calories at one sitting increases the likelihood of GI side effects. Brandy Dunlap agrees to continue metformin for now and a prescription was written today for 1 month refill. We will re-check labs in 2 weeks at next  visit. Humna agreed to follow up with [name] as directed to monitor her progress.  Diabetes risk counselling Brandy Dunlap was given extended (at least 15 minutes) diabetes prevention counseling today. She is 66 y.o. female and has risk factors for diabetes including obesity and pre-diabetes. We discussed intensive lifestyle modifications today with an emphasis on weight loss as well as increasing exercise and decreasing simple carbohydrates in her diet.  Obesity Brandy Dunlap is currently in the action stage of change. As such, her goal is to continue with weight loss efforts She has agreed to follow the Category 2 plan Brandy Dunlap has been instructed to work up to a goal of 150 minutes of combined cardio and strengthening exercise per week for weight loss and overall health benefits. We discussed the following Behavioral Modification Stratagies today: increasing lean protein intake and decreasing simple carbohydrates   Brandy Dunlap has agreed to follow up with our clinic in 2 weeks. She was informed of the importance of frequent follow up visits to maximize her success with intensive lifestyle modifications for her multiple health conditions.  I, Doreene Nest, am acting as scribe for Dennard Nip, MD  I have reviewed the above documentation for accuracy and completeness, and I agree with the above. -Dennard Nip, MD

## 2016-07-25 ENCOUNTER — Ambulatory Visit (INDEPENDENT_AMBULATORY_CARE_PROVIDER_SITE_OTHER): Payer: Medicare Other | Admitting: Family Medicine

## 2016-07-25 VITALS — BP 117/71 | HR 72 | Temp 98.1°F | Ht 63.0 in | Wt 184.0 lb

## 2016-07-25 DIAGNOSIS — R7303 Prediabetes: Secondary | ICD-10-CM | POA: Diagnosis not present

## 2016-07-25 DIAGNOSIS — E559 Vitamin D deficiency, unspecified: Secondary | ICD-10-CM

## 2016-07-25 DIAGNOSIS — E669 Obesity, unspecified: Secondary | ICD-10-CM | POA: Diagnosis not present

## 2016-07-25 DIAGNOSIS — Z6832 Body mass index (BMI) 32.0-32.9, adult: Secondary | ICD-10-CM | POA: Diagnosis not present

## 2016-07-25 NOTE — Progress Notes (Signed)
Office: [phone]  /  Fax: [phone]   HPI:   Chief Complaint: OBESITY Brandy Dunlap is here to discuss her progress with her obesity treatment plan. She is on the  follow the Category 1 plan and is following her eating plan approximately 90 % of the time. She states she is exercising low impact aerobics, yoga and walking for 45 minutes 5 times per week. Brandy Dunlap maintained weight. She has been getting enough protein, however she has been deviating and has not been staying within calorie range and wants more options. Her weight is 184 lb (83.5 kg) today and has maintained weight over a period of 2 weeks since her last visit. She has lost 11 lbs since starting treatment with [name].  Vitamin D deficiency Brandy Dunlap has a diagnosis of vitamin D deficiency. She is currently taking vit D and denies nausea, vomiting or muscle weakness.  Pre-Diabetes Brandy Dunlap has a diagnosis of pre-diabetes based on her elevated Hgb A1c and was informed this puts her at greater risk of developing diabetes. She is taking metformin currently, has been skipping doses. She continues to work on diet and exercise to decrease risk of diabetes. She denies polyphagia, nausea or hypoglycemia.    ALLERGIES: Allergies  Allergen Reactions  . Pantoprazole Sodium Other (See Comments)    Racing heart    MEDICATIONS: Current Outpatient Prescriptions on File Prior to Visit  Medication Sig Dispense Refill  . budesonide-formoterol (SYMBICORT) 160-4.5 MCG/ACT inhaler Inhale 2 puffs into the lungs 2 (two) times daily.    . Cholecalciferol (VITAMIN D3) 50000 units CAPS Take 1 Dose by mouth once a week. 4 capsule 0  . dexlansoprazole (DEXILANT) 60 MG capsule Take 1 capsule (60 mg total) by mouth daily. 90 capsule 0  . EPIPEN 2-PAK 0.3 MG/0.3ML SOAJ injection   1  . ibuprofen (ADVIL,MOTRIN) 800 MG tablet TAKE 1 TABLET BY MOUTH 3 TIMES A DAY WITH MEALS X 14 DAYS  0  . mometasone (NASONEX) 50 MCG/ACT nasal spray Place 2 sprays into the nose daily.  17 g 12  . Multiple Vitamin (MULTIVITAMIN) capsule Take 1 capsule by mouth daily.      [name] omalizumab (XOLAIR) 150 MG injection Inject 150 mg into the skin every 14 (fourteen) days.    [name] PROAIR RESPICLICK 329 (90 BASE) MCG/ACT AEPB Inhale 2 puffs into the lungs every 6 (six) hours as needed.  0  . traMADol (ULTRAM) 50 MG tablet TAKE 1-2 TABLET BY ORAL ROUTE EVERY 6 HOURS AS NEEDED  1  . amLODipine (NORVASC) 5 MG tablet TAKE 1 TABLET (5 MG TOTAL) BY MOUTH DAILY. 90 tablet 1  . diclofenac sodium (VOLTAREN) 1 % GEL APPLY (2G) BY TOPICAL ROUTE 4 TIMES EVERY DAY TO THE AFFECTED AREA(S)  1  . erythromycin ophthalmic ointment Place 1 application into the right eye at bedtime. (Patient not taking: Reported on 07/25/2016) 3.5 g 0  . metFORMIN (GLUCOPHAGE) 500 MG tablet Take 1 tablet (500 mg total) by mouth daily with breakfast. (Patient not taking: Reported on 07/25/2016) 30 tablet 0   No current facility-administered medications on file prior to visit.     PAST MEDICAL HISTORY: Past Medical History:  Diagnosis Date  . Allergy   . Asthma   . B12 deficiency   . Back pain   . Bronchitis    hx  . Colon polyp 10/06/2003  . GERD (gastroesophageal reflux disease)   . Hypertension   . Low back pain   . Osteoarthritis   . Sinus  trouble     PAST SURGICAL HISTORY: Past Surgical History:  Procedure Laterality Date  . CHOLECYSTECTOMY    . COLONOSCOPY    . COLONOSCOPY W/ BIOPSIES  10/06/2003  . LUMBAR FUSION  2008  . PARTIAL HIP ARTHROPLASTY Right 2009    hip replacement  . TONSILLECTOMY    . TOTAL KNEE ARTHROPLASTY Right 11/16/2013   Procedure: RIGHT TOTAL KNEE ARTHROPLASTY;  Surgeon: Vickey Huger, MD;  Location: [facility];  Service: Orthopedics;  Laterality: Right;  [name] VIDEO BRONCHOSCOPY Bilateral 12/05/2012   Procedure: VIDEO BRONCHOSCOPY WITHOUT FLUORO;  Surgeon: Collene Gobble, MD;  Location: [facility];  Service: Cardiopulmonary;  Laterality: Bilateral;    SOCIAL HISTORY: Social History    Substance Use Topics  . Smoking status: Never Smoker  . Smokeless tobacco: Never Used  . Alcohol use No    FAMILY HISTORY: Family History  Problem Relation Age of Onset  . COPD Father        died at age 61  . Emphysema Father   . Heart Problems Father   . Heart disease Brother        stent with 90%  . COPD Mother   . Heart disease Mother   . Ovarian cancer Mother   . Emphysema Mother   . Hypertension Mother   . Hyperlipidemia Mother   . Heart Problems Mother   . Stroke Mother   . Thyroid disease Mother   . Cancer Mother   . Colon cancer Neg Hx   . Esophageal cancer Neg Hx   . Rectal cancer Neg Hx   . Stomach cancer Neg Hx     ROS: Review of Systems  Constitutional: Negative for weight loss.  Gastrointestinal: Negative for nausea and vomiting.  Musculoskeletal:       Negative muscle weakness  Endo/Heme/Allergies:       Negative polyphagia Negative hypoglycemia    PHYSICAL EXAM: Blood pressure 117/71, pulse 72, temperature 98.1 F (36.7 C), temperature source Oral, height 5\' 3"  (1.6 m), weight 184 lb (83.5 kg), SpO2 97 %. Body mass index is 32.59 kg/m. Physical Exam  Constitutional: She is oriented to person, place, and time. She appears well-developed and well-nourished.  Cardiovascular: Normal rate.   Pulmonary/Chest: Effort normal.  Musculoskeletal: Normal range of motion.  Neurological: She is oriented to person, place, and time.  Skin: Skin is warm and dry.  Psychiatric: She has a normal mood and affect. Her behavior is normal.  Vitals reviewed.   RECENT LABS AND TESTS: BMET    Component Value Date/Time   NA 140 04/17/2016 1150   K 3.8 04/17/2016 1150   CL 103 04/17/2016 1150   CO2 23 04/17/2016 1150   GLUCOSE 83 04/17/2016 1150   GLUCOSE 95 10/24/2015 1054   BUN 10 04/17/2016 1150   CREATININE 0.72 04/17/2016 1150   CALCIUM 8.9 04/17/2016 1150   GFRNONAA 88 04/17/2016 1150   GFRAA 102 04/17/2016 1150   Lab Results  Component Value Date    HGBA1C 5.8 (H) 04/17/2016   Lab Results  Component Value Date   INSULIN 13.9 04/17/2016   CBC    Component Value Date/Time   WBC 5.1 04/17/2016 1150   WBC 6.6 10/24/2015 1054   RBC 4.68 04/17/2016 1150   RBC 4.48 10/24/2015 1054   HGB 12.4 10/24/2015 1054   HCT 39.5 04/17/2016 1150   PLT 266.0 10/24/2015 1054   MCV 84 04/17/2016 1150   MCH 27.1 04/17/2016 1150   MCH 29.3 11/18/2013 0705  MCHC 32.2 04/17/2016 1150   MCHC 33.4 10/24/2015 1054   RDW 16.0 (H) 04/17/2016 1150   LYMPHSABS 2.7 04/17/2016 1150   MONOABS 0.4 10/24/2015 1054   EOSABS 0.2 04/17/2016 1150   BASOSABS 0.0 04/17/2016 1150   Iron/TIBC/Ferritin/ %Sat No results found for: IRON, TIBC, FERRITIN, IRONPCTSAT Lipid Panel     Component Value Date/Time   CHOL 157 04/17/2016 1150   TRIG 222 (H) 04/17/2016 1150   HDL 42 04/17/2016 1150   CHOLHDL 3 10/24/2015 1054   VLDL 21.6 10/24/2015 1054   LDLCALC 71 04/17/2016 1150   LDLDIRECT 149.9 09/11/2010 0908   Hepatic Function Panel     Component Value Date/Time   PROT 6.7 04/17/2016 1150   ALBUMIN 4.3 04/17/2016 1150   AST 28 04/17/2016 1150   ALT 28 04/17/2016 1150   ALKPHOS 78 04/17/2016 1150   BILITOT 0.3 04/17/2016 1150   BILIDIR 0.0 10/24/2015 1054      Component Value Date/Time   TSH 1.890 04/17/2016 1150   TSH 1.15 10/24/2015 1054   TSH 1.30 04/25/2015 1132    ASSESSMENT AND PLAN: Vitamin D deficiency - Plan: VITAMIN D 25 Hydroxy (Vit-D Deficiency, Fractures), Cholecalciferol (VITAMIN D3) 50000 units CAPS  Prediabetes - Plan: Comprehensive metabolic panel, Hemoglobin A1c, Insulin, random, Lipid Panel With LDL/HDL Ratio  Class 1 obesity without serious comorbidity with body mass index (BMI) of 32.0 to 32.9 in adult, unspecified obesity type  PLAN:  Vitamin D Deficiency Brandy Dunlap was informed that low vitamin D levels contributes to fatigue and are associated with obesity, breast, and colon cancer. She agrees to continue to take prescription  Vit D @50 ,000 IU every week, we will refill for 1 month and will follow up for routine testing of vitamin D, at least 2-3 times per year. She was informed of the risk of over-replacement of vitamin D and agrees to not increase her dose unless he discusses this with [name] first. Brandy Dunlap agrees to follow up with our clinic in 2 weeks.  Pre-Diabetes Brandy Dunlap will continue to work on weight loss, exercise, and decreasing simple carbohydrates in her diet to help decrease the risk of diabetes. We dicussed metformin including benefits and risks. She was informed that eating too many simple carbohydrates or too many calories at one sitting increases the likelihood of GI side effects. Brandy Dunlap agrees to continue metformin and will follow up with [name] as directed to monitor her progress.  Obesity Brandy Dunlap is currently in the action stage of change. As such, her goal is to continue with weight loss efforts She has agreed to follow a lower carbohydrate, vegetable and lean protein rich diet plan Brandy Dunlap has been instructed to work up to a goal of 150 minutes of combined cardio and strengthening exercise per week for weight loss and overall health benefits. We discussed the following Behavioral Modification Strategies today: increasing lean protein intake and decreasing simple carbohydrates   Brandy Dunlap has agreed to follow up with our clinic in 2 weeks. She was informed of the importance of frequent follow up visits to maximize her success with intensive lifestyle modifications for her multiple health conditions.  I, Brandy Dunlap Nest, am acting as scribe for Dennard Nip, MD  I have reviewed the above documentation for accuracy and completeness, and I agree with the above. -Dennard Nip, MD  Office: [phone]  /  Fax: [phone]  OBESITY BEHAVIORAL INTERVENTION VISIT  Today's visit was # 7 out of 22.  Starting weight: 195 lbs Starting date: 04/17/16 Today's weight :  184 lbs Today's date: 07/25/2016 Total lbs lost to date:  11 (Patients must lose 7 lbs in the first 6 months to continue with counseling)   ASK: We discussed the diagnosis of obesity with Annetta Maw today and Kensli agreed to give [name] permission to discuss obesity behavioral modification therapy today.  ASSESS: Nechuma has the diagnosis of obesity and her BMI today is 32.7 Keerthi is in the action stage of change   ADVISE: Tanna was educated on the multiple health risks of obesity as well as the benefit of weight loss to improve her health. She was advised of the need for long term treatment and the importance of lifestyle modifications.  AGREE: Multiple dietary modification options and treatment options were discussed and  [place] agreed to follow a lower carbohydrate, vegetable and lean protein rich diet plan We discussed the following Behavioral Modification Strategies today: increasing lean protein intake and decreasing simple carbohydrates

## 2016-07-26 LAB — LIPID PANEL WITH LDL/HDL RATIO
Cholesterol, Total: 183 mg/dL (ref 100–199)
HDL: 57 mg/dL (ref >39)
LDL Calculated: 94 mg/dL (ref 0–99)
LDl/HDL Ratio: 1.6 ratio (ref 0.0–3.2)
Triglycerides: 158 mg/dL — ABNORMAL HIGH (ref 0–149)
VLDL Cholesterol Cal: 32 mg/dL (ref 5–40)

## 2016-07-26 LAB — COMPREHENSIVE METABOLIC PANEL
ALT: 24 IU/L (ref 0–32)
AST: 23 IU/L (ref 0–40)
Albumin/Globulin Ratio: 1.5 (ref 1.2–2.2)
Albumin: 4.1 g/dL (ref 3.6–4.8)
Alkaline Phosphatase: 78 IU/L (ref 39–117)
BUN/Creatinine Ratio: 20 (ref 12–28)
BUN: 15 mg/dL (ref 8–27)
Bilirubin Total: 0.4 mg/dL (ref 0.0–1.2)
CO2: 25 mmol/L (ref 18–29)
Calcium: 9.6 mg/dL (ref 8.7–10.3)
Chloride: 102 mmol/L (ref 96–106)
Creatinine, Ser: 0.74 mg/dL (ref 0.57–1.00)
GFR calc Af Amer: 98 mL/min/{1.73_m2} (ref >59)
GFR calc non Af Amer: 85 mL/min/{1.73_m2} (ref >59)
Globulin, Total: 2.8 g/dL (ref 1.5–4.5)
Glucose: 94 mg/dL (ref 65–99)
Potassium: 4.4 mmol/L (ref 3.5–5.2)
Sodium: 139 mmol/L (ref 134–144)
Total Protein: 6.9 g/dL (ref 6.0–8.5)

## 2016-07-26 LAB — INSULIN, RANDOM: INSULIN: 19.7 u[IU]/mL (ref 2.6–24.9)

## 2016-07-26 LAB — HEMOGLOBIN A1C
Est. average glucose Bld gHb Est-mCnc: 126 mg/dL
Hgb A1c MFr Bld: 6 % — ABNORMAL HIGH (ref 4.8–5.6)

## 2016-07-26 LAB — VITAMIN D 25 HYDROXY (VIT D DEFICIENCY, FRACTURES): Vit D, 25-Hydroxy: 53.9 ng/mL (ref 30.0–100.0)

## 2016-07-26 MED ORDER — VITAMIN D3 1.25 MG (50000 UT) PO CAPS: 1.0000 | ORAL_CAPSULE | ORAL | 0 refills | Status: DC

## 2016-07-30 ENCOUNTER — Ambulatory Visit (INDEPENDENT_AMBULATORY_CARE_PROVIDER_SITE_OTHER): Payer: Medicare Other | Admitting: Family

## 2016-07-30 ENCOUNTER — Encounter: Payer: Self-pay | Admitting: Family

## 2016-07-30 VITALS — BP 130/62 | HR 76 | Temp 98.2°F | Resp 18 | Ht 63.0 in

## 2016-07-30 DIAGNOSIS — R300 Dysuria: Secondary | ICD-10-CM

## 2016-07-30 DIAGNOSIS — N12 Tubulo-interstitial nephritis, not specified as acute or chronic: Secondary | ICD-10-CM

## 2016-07-30 LAB — POC URINALSYSI DIPSTICK (AUTOMATED)
Bilirubin, UA: NEGATIVE
Blood, UA: NEGATIVE
Glucose, UA: NEGATIVE
Ketones, UA: NEGATIVE
Leukocytes, UA: NEGATIVE
Nitrite, UA: NEGATIVE
Protein, UA: NEGATIVE
Spec Grav, UA: 1.01 (ref 1.010–1.025)
Urobilinogen, UA: 0.2 E.U./dL
pH, UA: 6 (ref 5.0–8.0)

## 2016-07-30 MED ORDER — CIPROFLOXACIN HCL 250 MG PO TABS
250.0000 mg | ORAL_TABLET | Freq: Two times a day (BID) | ORAL | 0 refills | Status: DC
Start: 2016-07-30 — End: 2016-10-01

## 2016-07-30 NOTE — Patient Instructions (Addendum)
Please begin cipro for your urinary tract infection. Call if new/worsening symptoms or if symptoms not improved in next 2-3 days.    Pyelonephritis, Adult Pyelonephritis is a kidney infection. The kidneys are organs that help clean your blood by moving waste out of your blood and into your pee (urine). This infection can happen quickly, or it can last for a long time. In most cases, it clears up with treatment and does not cause other problems. Follow these instructions at home: Medicines  Take over-the-counter and prescription medicines only as told by your doctor.  Take your antibiotic medicine as told by your doctor. Do not stop taking the medicine even if you start to feel better. General instructions  Drink enough fluid to keep your pee clear or pale yellow.  Avoid caffeine, tea, and carbonated drinks.  Pee (urinate) often. Avoid holding in pee for long periods of time.  Pee before and after sex.  After pooping (having a bowel movement), women should wipe from front to back. Use each tissue only once.  Keep all follow-up visits as told by your doctor. This is important. Contact a doctor if:  You do not feel better after 2 days.  Your symptoms get worse.  You have a fever. Get help right away if:  You cannot take your medicine or drink fluids as told.  You have chills and shaking.  You throw up (vomit).  You have very bad pain in your side (flank) or back.  You feel very weak or you pass out (faint). This information is not intended to replace advice given to you by your health care provider. Make sure you discuss any questions you have with your health care provider. Document Released: 03/22/2004 Document Revised: 07/21/2015 Document Reviewed: 06/07/2014 Elsevier Interactive Patient Education  [employer].

## 2016-07-30 NOTE — Progress Notes (Signed)
Subjective:    Patient ID: Brandy Dunlap, female    DOB: May 27, 1950, 66 y.o.   MRN: [id number]  HPI  Brandy Dunlap is a 66 yr old female who presents today with c/o dysuria. 6 days of right pelvic pressure and back pain. Dysuria x 2 days. Denies fever, denies hematuria.  Denies hx of kidney stone. + urgency/frequency.   Review of Systems See HPI  Past Medical History:  Diagnosis Date  . Allergy   . Asthma   . B12 deficiency   . Back pain   . Bronchitis    hx  . Colon polyp 10/06/2003  . GERD (gastroesophageal reflux disease)   . Hypertension   . Low back pain   . Osteoarthritis   . Sinus trouble      Social History   Social History  . Marital status: Single    Spouse name: N/A  . Number of children: 1  . Years of education: N/A   Occupational History  . teacher [employer]   Social History Main Topics  . Smoking status: Never Smoker  . Smokeless tobacco: Never Used  . Alcohol use No  . Drug use: No  . Sexual activity: Yes    Partners: Male    Birth control/ protection: Post-menopausal   Other Topics Concern  . Not on file   Social History Narrative   Divorced   One daughter- lives locally and one grandson   Retired Pharmacist, hospital,  Has masters degree   Enjoys reading, spending time with her grandson    Past Surgical History:  Procedure Laterality Date  . CHOLECYSTECTOMY    . COLONOSCOPY    . COLONOSCOPY W/ BIOPSIES  10/06/2003  . LUMBAR FUSION  2008  . PARTIAL HIP ARTHROPLASTY Right 2009    hip replacement  . TONSILLECTOMY    . TOTAL KNEE ARTHROPLASTY Right 11/16/2013   Procedure: RIGHT TOTAL KNEE ARTHROPLASTY;  Surgeon: Vickey Huger, MD;  Location: [facility];  Service: Orthopedics;  Laterality: Right;  [name] VIDEO BRONCHOSCOPY Bilateral 12/05/2012   Procedure: VIDEO BRONCHOSCOPY WITHOUT FLUORO;  Surgeon: Collene Gobble, MD;  Location: [facility];  Service: Cardiopulmonary;  Laterality: Bilateral;    Family History  Problem Relation Age of Onset    . COPD Father        died at age 45  . Emphysema Father   . Heart Problems Father   . Heart disease Brother        stent with 90%  . COPD Mother   . Heart disease Mother   . Ovarian cancer Mother   . Emphysema Mother   . Hypertension Mother   . Hyperlipidemia Mother   . Heart Problems Mother   . Stroke Mother   . Thyroid disease Mother   . Cancer Mother   . Colon cancer Neg Hx   . Esophageal cancer Neg Hx   . Rectal cancer Neg Hx   . Stomach cancer Neg Hx     Allergies  Allergen Reactions  . Pantoprazole Sodium Other (See Comments)    Racing heart    Current Outpatient Prescriptions on File Prior to Visit  Medication Sig Dispense Refill  . amLODipine (NORVASC) 5 MG tablet TAKE 1 TABLET (5 MG TOTAL) BY MOUTH DAILY. 90 tablet 1  . budesonide-formoterol (SYMBICORT) 160-4.5 MCG/ACT inhaler Inhale 2 puffs into the lungs 2 (two) times daily.    . Cholecalciferol (VITAMIN D3) 50000 units CAPS Take 1 Dose by mouth once a week.  4 capsule 0  . dexlansoprazole (DEXILANT) 60 MG capsule Take 1 capsule (60 mg total) by mouth daily. 90 capsule 0  . diclofenac sodium (VOLTAREN) 1 % GEL APPLY (2G) BY TOPICAL ROUTE 4 TIMES EVERY DAY TO THE AFFECTED AREA(S)  1  . EPIPEN 2-PAK 0.3 MG/0.3ML SOAJ injection   1  . mometasone (NASONEX) 50 MCG/ACT nasal spray Place 2 sprays into the nose daily. 17 g 12  . Multiple Vitamin (MULTIVITAMIN) capsule Take 1 capsule by mouth daily.      [name] omalizumab (XOLAIR) 150 MG injection Inject 150 mg into the skin every 14 (fourteen) days.    [name] PROAIR RESPICLICK 372 (90 BASE) MCG/ACT AEPB Inhale 2 puffs into the lungs every 6 (six) hours as needed.  0  . traMADol (ULTRAM) 50 MG tablet TAKE 1-2 TABLET BY ORAL ROUTE EVERY 6 HOURS AS NEEDED  1   No current facility-administered medications on file prior to visit.     BP 130/62 (BP Location: Right Arm, Cuff Size: Large)   Pulse 76   Temp 98.2 F (36.8 C) (Oral)   Resp 18   Ht 5\' 3"  (1.6 m)   SpO2 97%        Objective:   Physical Exam  Constitutional: She appears well-developed and well-nourished.  Cardiovascular: Normal rate, regular rhythm and normal heart sounds.   No murmur heard. Pulmonary/Chest: Effort normal and breath sounds normal. No respiratory distress. She has no wheezes.  Abdominal:  R lower abd tenderness. R CVAT   Psychiatric: She has a normal mood and affect. Her behavior is normal. Judgment and thought content normal.          Assessment & Plan:  Pyelonephritis- exam/hx consistent with R sided pyelonephritis.  UA is unremarkable however we will send for culture and begin cipro.  For pain, advised pt OK to use Azo.  Discussed if pain worsens, or if pain fails to improve will need CT abd/pelvis. In the meantime, obtain cbc and bmet.

## 2016-07-31 LAB — CBC WITH DIFFERENTIAL/PLATELET
Basophils Absolute: 0.1 10*3/uL (ref 0.0–0.1)
Basophils Relative: 1.3 % (ref 0.0–3.0)
Eosinophils Absolute: 0.4 10*3/uL (ref 0.0–0.7)
Eosinophils Relative: 4.7 % (ref 0.0–5.0)
HCT: 37.2 % (ref 36.0–46.0)
Hemoglobin: 12.2 g/dL (ref 12.0–15.0)
Lymphocytes Relative: 37.5 % (ref 12.0–46.0)
Lymphs Abs: 3.5 10*3/uL (ref 0.7–4.0)
MCHC: 32.9 g/dL (ref 30.0–36.0)
MCV: 84.4 fl (ref 78.0–100.0)
Monocytes Absolute: 0.7 10*3/uL (ref 0.1–1.0)
Monocytes Relative: 7.2 % (ref 3.0–12.0)
Neutro Abs: 4.6 10*3/uL (ref 1.4–7.7)
Neutrophils Relative %: 49.3 % (ref 43.0–77.0)
Platelets: 283 10*3/uL (ref 150.0–400.0)
RBC: 4.4 Mil/uL (ref 3.87–5.11)
RDW: 15.9 % — ABNORMAL HIGH (ref 11.5–15.5)
WBC: 9.4 10*3/uL (ref 4.0–10.5)

## 2016-07-31 LAB — BASIC METABOLIC PANEL
BUN: 16 mg/dL (ref 6–23)
CO2: 24 mEq/L (ref 19–32)
Calcium: 9.4 mg/dL (ref 8.4–10.5)
Chloride: 105 mEq/L (ref 96–112)
Creatinine, Ser: 0.65 mg/dL (ref 0.40–1.20)
GFR: 97 mL/min (ref >60.00)
Glucose, Bld: 58 mg/dL — ABNORMAL LOW (ref 70–99)
Potassium: 3.7 mEq/L (ref 3.5–5.1)
Sodium: 139 mEq/L (ref 135–145)

## 2016-07-31 LAB — URINE CULTURE: Organism ID, Bacteria: NO GROWTH

## 2016-08-08 ENCOUNTER — Ambulatory Visit (INDEPENDENT_AMBULATORY_CARE_PROVIDER_SITE_OTHER): Payer: Medicare Other | Admitting: Family Medicine

## 2016-08-08 VITALS — BP 117/75 | HR 72 | Temp 98.3°F | Ht 63.0 in | Wt 183.0 lb

## 2016-08-08 DIAGNOSIS — R7303 Prediabetes: Secondary | ICD-10-CM

## 2016-08-08 DIAGNOSIS — E669 Obesity, unspecified: Secondary | ICD-10-CM | POA: Diagnosis not present

## 2016-08-08 DIAGNOSIS — Z6832 Body mass index (BMI) 32.0-32.9, adult: Secondary | ICD-10-CM

## 2016-08-08 DIAGNOSIS — Z9189 Other specified personal risk factors, not elsewhere classified: Secondary | ICD-10-CM | POA: Diagnosis not present

## 2016-08-08 DIAGNOSIS — E559 Vitamin D deficiency, unspecified: Secondary | ICD-10-CM | POA: Diagnosis not present

## 2016-08-08 MED ORDER — METFORMIN HCL 500 MG PO TABS: 500.0000 mg | ORAL_TABLET | Freq: Two times a day (BID) | ORAL | 0 refills | Status: DC

## 2016-08-08 MED ORDER — VITAMIN D (ERGOCALCIFEROL) 1.25 MG (50000 UNIT) PO CAPS: 50000.0000 [IU] | ORAL_CAPSULE | ORAL | 0 refills | Status: DC

## 2016-08-08 NOTE — Progress Notes (Signed)
Office: [phone]  /  Fax: [phone]   HPI:   Chief Complaint: OBESITY Brandy Dunlap is here to discuss her progress with her obesity treatment plan. She is on the  follow a lower carbohydrate, vegetable and lean protein rich diet plan and is following her eating plan approximately 90 % of the time. She states she is exercising low impact aerobics, yoga, swimming for 45 minutes 5 times per week. Kerrilynn continues to do well with weight loss. Her hunger is controlled and she has been planning ahead well. Her weight is 183 lb (83 kg) today and has had a weight loss of 1 pound over a period of 2 weeks since her last visit. She has lost 12 lbs since starting treatment with [name].  Vitamin D deficiency Brandy Dunlap has a diagnosis of vitamin D deficiency. She is currently taking vit D, level is improved and she denies nausea, vomiting or muscle weakness.  Pre-Diabetes Brandy Dunlap has a diagnosis of pre-diabetes based on her elevated Hgb A1c and was informed this puts her at greater risk of developing diabetes. She is not taking metformin currently and continues to work on diet and exercise to decrease risk of diabetes. She denies nausea, polyphagia or hypoglycemia.  At risk for diabetes Brandy Dunlap is at higher than average risk for developing diabetes due to her obesity and pre-diabets. She currently denies polyuria or polydipsia.  ALLERGIES: Allergies  Allergen Reactions  . Pantoprazole Sodium Other (See Comments)    Racing heart    MEDICATIONS: Current Outpatient Prescriptions on File Prior to Visit  Medication Sig Dispense Refill  . amLODipine (NORVASC) 5 MG tablet TAKE 1 TABLET (5 MG TOTAL) BY MOUTH DAILY. 90 tablet 1  . budesonide-formoterol (SYMBICORT) 160-4.5 MCG/ACT inhaler Inhale 2 puffs into the lungs 2 (two) times daily.    . Cholecalciferol (VITAMIN D3) 50000 units CAPS Take 1 Dose by mouth once a week. 4 capsule 0  . dexlansoprazole (DEXILANT) 60 MG capsule Take 1 capsule (60 mg total) by mouth daily.  90 capsule 0  . EPIPEN 2-PAK 0.3 MG/0.3ML SOAJ injection   1  . mometasone (NASONEX) 50 MCG/ACT nasal spray Place 2 sprays into the nose daily. 17 g 12  . montelukast (SINGULAIR) 10 MG tablet Take 10 mg by mouth every evening.  5  . Multiple Vitamin (MULTIVITAMIN) capsule Take 1 capsule by mouth daily.      [name] omalizumab (XOLAIR) 150 MG injection Inject 150 mg into the skin every 14 (fourteen) days.    [name] PROAIR RESPICLICK 008 (90 BASE) MCG/ACT AEPB Inhale 2 puffs into the lungs every 6 (six) hours as needed.  0  . ciprofloxacin (CIPRO) 250 MG tablet Take 1 tablet (250 mg total) by mouth 2 (two) times daily. (Patient not taking: Reported on 08/08/2016) 14 tablet 0  . diclofenac sodium (VOLTAREN) 1 % GEL APPLY (2G) BY TOPICAL ROUTE 4 TIMES EVERY DAY TO THE AFFECTED AREA(S)  1  . traMADol (ULTRAM) 50 MG tablet TAKE 1-2 TABLET BY ORAL ROUTE EVERY 6 HOURS AS NEEDED  1   No current facility-administered medications on file prior to visit.     PAST MEDICAL HISTORY: Past Medical History:  Diagnosis Date  . Allergy   . Asthma   . B12 deficiency   . Back pain   . Bronchitis    hx  . Colon polyp 10/06/2003  . GERD (gastroesophageal reflux disease)   . Hypertension   . Low back pain   . Osteoarthritis   . Sinus  trouble     PAST SURGICAL HISTORY: Past Surgical History:  Procedure Laterality Date  . CHOLECYSTECTOMY    . COLONOSCOPY    . COLONOSCOPY W/ BIOPSIES  10/06/2003  . LUMBAR FUSION  2008  . PARTIAL HIP ARTHROPLASTY Right 2009    hip replacement  . TONSILLECTOMY    . TOTAL KNEE ARTHROPLASTY Right 11/16/2013   Procedure: RIGHT TOTAL KNEE ARTHROPLASTY;  Surgeon: Vickey Huger, MD;  Location: [place];  Service: Orthopedics;  Laterality: Right;  [name] VIDEO BRONCHOSCOPY Bilateral 12/05/2012   Procedure: VIDEO BRONCHOSCOPY WITHOUT FLUORO;  Surgeon: Collene Gobble, MD;  Location: [facility];  Service: Cardiopulmonary;  Laterality: Bilateral;    SOCIAL HISTORY: Social History  Substance Use  Topics  . Smoking status: Never Smoker  . Smokeless tobacco: Never Used  . Alcohol use No    FAMILY HISTORY: Family History  Problem Relation Age of Onset  . COPD Father        died at age 75  . Emphysema Father   . Heart Problems Father   . Heart disease Brother        stent with 90%  . COPD Mother   . Heart disease Mother   . Ovarian cancer Mother   . Emphysema Mother   . Hypertension Mother   . Hyperlipidemia Mother   . Heart Problems Mother   . Stroke Mother   . Thyroid disease Mother   . Cancer Mother   . Colon cancer Neg Hx   . Esophageal cancer Neg Hx   . Rectal cancer Neg Hx   . Stomach cancer Neg Hx     ROS: Review of Systems  Constitutional: Positive for weight loss.  Gastrointestinal: Negative for nausea and vomiting.  Genitourinary: Negative for frequency.  Musculoskeletal:       Negative muscle weakness  Endo/Heme/Allergies: Negative for polydipsia.       Negative polyphagia Negative hypoglycemia    PHYSICAL EXAM: Blood pressure 117/75, pulse 72, temperature 98.3 F (36.8 C), temperature source Oral, height 5\' 3"  (1.6 m), weight 183 lb (83 kg), SpO2 98 %. Body mass index is 32.42 kg/m. Physical Exam  Constitutional: She is oriented to person, place, and time. She appears well-developed and well-nourished.  Cardiovascular: Normal rate.   Pulmonary/Chest: Effort normal.  Musculoskeletal: Normal range of motion.  Neurological: She is oriented to person, place, and time.  Skin: Skin is warm and dry.  Psychiatric: She has a normal mood and affect. Her behavior is normal.  Vitals reviewed.   RECENT LABS AND TESTS: BMET    Component Value Date/Time   NA 139 07/30/2016 1439   NA 139 07/25/2016 0917   K 3.7 07/30/2016 1439   CL 105 07/30/2016 1439   CO2 24 07/30/2016 1439   GLUCOSE 58 (L) 07/30/2016 1439   BUN 16 07/30/2016 1439   BUN 15 07/25/2016 0917   CREATININE 0.65 07/30/2016 1439   CALCIUM 9.4 07/30/2016 1439   GFRNONAA 85 07/25/2016  0917   GFRAA 98 07/25/2016 0917   Lab Results  Component Value Date   HGBA1C 6.0 (H) 07/25/2016   HGBA1C 5.8 (H) 04/17/2016   Lab Results  Component Value Date   INSULIN 19.7 07/25/2016   INSULIN 13.9 04/17/2016   CBC    Component Value Date/Time   WBC 9.4 07/30/2016 1439   RBC 4.40 07/30/2016 1439   HGB 12.2 07/30/2016 1439   HGB 12.7 04/17/2016 1150   HCT 37.2 07/30/2016 1439   HCT 39.5 04/17/2016  1150   PLT 283.0 07/30/2016 1439   MCV 84.4 07/30/2016 1439   MCV 84 04/17/2016 1150   MCH 27.1 04/17/2016 1150   MCH 29.3 11/18/2013 0705   MCHC 32.9 07/30/2016 1439   RDW 15.9 (H) 07/30/2016 1439   RDW 16.0 (H) 04/17/2016 1150   LYMPHSABS 3.5 07/30/2016 1439   LYMPHSABS 2.7 04/17/2016 1150   MONOABS 0.7 07/30/2016 1439   EOSABS 0.4 07/30/2016 1439   EOSABS 0.2 04/17/2016 1150   BASOSABS 0.1 07/30/2016 1439   BASOSABS 0.0 04/17/2016 1150   Iron/TIBC/Ferritin/ %Sat No results found for: IRON, TIBC, FERRITIN, IRONPCTSAT Lipid Panel     Component Value Date/Time   CHOL 183 07/25/2016 0917   TRIG 158 (H) 07/25/2016 0917   HDL 57 07/25/2016 0917   CHOLHDL 3 10/24/2015 1054   VLDL 21.6 10/24/2015 1054   LDLCALC 94 07/25/2016 0917   LDLDIRECT 149.9 09/11/2010 0908   Hepatic Function Panel     Component Value Date/Time   PROT 6.9 07/25/2016 0917   ALBUMIN 4.1 07/25/2016 0917   AST 23 07/25/2016 0917   ALT 24 07/25/2016 0917   ALKPHOS 78 07/25/2016 0917   BILITOT 0.4 07/25/2016 0917   BILIDIR 0.0 10/24/2015 1054      Component Value Date/Time   TSH 1.890 04/17/2016 1150   TSH 1.15 10/24/2015 1054   TSH 1.30 04/25/2015 1132    ASSESSMENT AND PLAN: Prediabetes - Plan: metFORMIN (GLUCOPHAGE) 500 MG tablet  Vitamin D deficiency - Plan: Vitamin D, Ergocalciferol, (DRISDOL) 50000 units CAPS capsule  At risk for diabetes mellitus  Class 1 obesity without serious comorbidity with body mass index (BMI) of 32.0 to 32.9 in adult, unspecified obesity  type  PLAN:  Vitamin D Deficiency Brandy Dunlap was informed that low vitamin D levels contributes to fatigue and are associated with obesity, breast, and colon cancer. She agrees to continue to take prescription Vit D @50 ,000 IU every week and will follow up for routine testing of vitamin D, at least 2-3 times per year. She was informed of the risk of over-replacement of vitamin D and agrees to not increase her dose unless he discusses this with [name] first.  Pre-Diabetes Brandy Dunlap will continue to work on weight loss, exercise, and decreasing simple carbohydrates in her diet to help decrease the risk of diabetes. We dicussed metformin including benefits and risks. She was informed that eating too many simple carbohydrates or too many calories at one sitting increases the likelihood of GI side effects. Brandy Dunlap requested metformin for now and a prescription was written today for metformin 500 mg bid #60 with no refills. Brandy Dunlap agreed to follow up with [name] as directed to monitor her progress.  Diabetes risk counselling Brandy Dunlap was given extended (at least 15 minutes) diabetes prevention counseling today. She is 66 y.o. female and has risk factors for diabetes including obesity and pre-diabetes. We discussed intensive lifestyle modifications today with an emphasis on weight loss as well as increasing exercise and decreasing simple carbohydrates in her diet.  Obesity Brandy Dunlap is currently in the action stage of change. As such, her goal is to continue with weight loss efforts She has agreed to keep a food journal with 1200 calories and 75+ grams of protein  Brandy Dunlap has been instructed to work up to a goal of 150 minutes of combined cardio and strengthening exercise per week for weight loss and overall health benefits. We discussed the following Behavioral Modification Strategies today: increasing lean protein intake, keeping healthy foods in the  home and keep a strict food journal  Brandy Dunlap has agreed to follow up with our clinic in 3  weeks. She was informed of the importance of frequent follow up visits to maximize her success with intensive lifestyle modifications for her multiple health conditions.  I, Doreene Nest, am acting as transcriptionist for Dennard Nip, MD  I have reviewed the above documentation for accuracy and completeness, and I agree with the above. -Dennard Nip, MD  OBESITY BEHAVIORAL INTERVENTION VISIT  Today's visit was # 8 out of 22.  Starting weight: 195 lbs Starting date: 04/17/16 Today's weight : 183 lbs Today's date: 08/08/2016 Total lbs lost to date: 12 (Patients must lose 7 lbs in the first 6 months to continue with counseling)   ASK: We discussed the diagnosis of obesity with Annetta Maw today and Max agreed to give [name] permission to discuss obesity behavioral modification therapy today.  ASSESS: Kayal has the diagnosis of obesity and her BMI today is 32.5 Zyla is in the action stage of change   ADVISE: Vaneta was educated on the multiple health risks of obesity as well as the benefit of weight loss to improve her health. She was advised of the need for long term treatment and the importance of lifestyle modifications.  AGREE: Multiple dietary modification options and treatment options were discussed and  Vicke agreed to keep a food journal with 1200 calories and 75+ grams of protein  We discussed the following Behavioral Modification Strategies today: increasing lean protein intake, keeping healthy foods in the home and keep a strict food journal.

## 2016-08-27 ENCOUNTER — Ambulatory Visit (INDEPENDENT_AMBULATORY_CARE_PROVIDER_SITE_OTHER): Payer: Medicare Other | Admitting: Family Medicine

## 2016-08-27 VITALS — BP 112/73 | HR 78 | Temp 98.6°F | Ht 63.0 in | Wt 181.0 lb

## 2016-08-27 DIAGNOSIS — E669 Obesity, unspecified: Secondary | ICD-10-CM | POA: Diagnosis not present

## 2016-08-27 DIAGNOSIS — J4520 Mild intermittent asthma, uncomplicated: Secondary | ICD-10-CM | POA: Diagnosis not present

## 2016-08-27 DIAGNOSIS — E559 Vitamin D deficiency, unspecified: Secondary | ICD-10-CM | POA: Diagnosis not present

## 2016-08-27 DIAGNOSIS — Z6832 Body mass index (BMI) 32.0-32.9, adult: Secondary | ICD-10-CM | POA: Diagnosis not present

## 2016-08-27 MED ORDER — VITAMIN D (ERGOCALCIFEROL) 1.25 MG (50000 UNIT) PO CAPS: 50000.0000 [IU] | ORAL_CAPSULE | ORAL | 0 refills | Status: DC

## 2016-08-27 NOTE — Progress Notes (Addendum)
Office: [phone]  /  Fax: [phone]   HPI:   Chief Complaint: OBESITY Brandy Dunlap is here to discuss her progress with her obesity treatment plan. She is on the keep a food journal with 1200 calories and 75+ grams of protein daily and is following her eating plan approximately 75 % of the time. She states she is exercising Monday, Friday Aerobics,Yoga Friday for 45 minutes 5 times per week and Swimming 1 to 2 times per week. Brandy Dunlap continues to do well with weight loss. She is planning ahead well, portion control/smart choices. She would like to have metabolism re-checked. Her weight is 181 lb (82.1 kg) today and has had a weight loss of 2 pounds over a period of 3 weeks since her last visit. She has lost 14 lbs since starting treatment with [name].  Vitamin D deficiency Brandy Dunlap has a diagnosis of vitamin D deficiency. She is currently taking vit D and denies nausea, vomiting or muscle weakness.  Asthma Brandy Dunlap has a diagnosis of asthma, well controlled, no recent exacerbation. She has not used rescue inhaler approximately for the past 6 months. Brandy Dunlap gets Xolair injections once every 2 weeks by Pulmonologist.  ALLERGIES: Allergies  Allergen Reactions   Pantoprazole Sodium Other (See Comments)    Racing heart    MEDICATIONS: Current Outpatient Prescriptions on File Prior to Visit  Medication Sig Dispense Refill   amLODipine (NORVASC) 5 MG tablet TAKE 1 TABLET (5 MG TOTAL) BY MOUTH DAILY. 90 tablet 1   budesonide-formoterol (SYMBICORT) 160-4.5 MCG/ACT inhaler Inhale 2 puffs into the lungs 2 (two) times daily.     dexlansoprazole (DEXILANT) 60 MG capsule Take 1 capsule (60 mg total) by mouth daily. 90 capsule 0   diclofenac sodium (VOLTAREN) 1 % GEL APPLY (2G) BY TOPICAL ROUTE 4 TIMES EVERY DAY TO THE AFFECTED AREA(S)  1   EPIPEN 2-PAK 0.3 MG/0.3ML SOAJ injection   1   metFORMIN (GLUCOPHAGE) 500 MG tablet Take 1 tablet (500 mg total) by mouth 2 (two) times daily with a meal. 60 tablet 0     mometasone (NASONEX) 50 MCG/ACT nasal spray Place 2 sprays into the nose daily. 17 g 12   montelukast (SINGULAIR) 10 MG tablet Take 10 mg by mouth every evening.  5   Multiple Vitamin (MULTIVITAMIN) capsule Take 1 capsule by mouth daily.       omalizumab (XOLAIR) 150 MG injection Inject 150 mg into the skin every 14 (fourteen) days.     PROAIR RESPICLICK 759 (90 BASE) MCG/ACT AEPB Inhale 2 puffs into the lungs every 6 (six) hours as needed.  0   ciprofloxacin (CIPRO) 250 MG tablet Take 1 tablet (250 mg total) by mouth 2 (two) times daily. (Patient not taking: Reported on 08/27/2016) 14 tablet 0   traMADol (ULTRAM) 50 MG tablet TAKE 1-2 TABLET BY ORAL ROUTE EVERY 6 HOURS AS NEEDED  1   No current facility-administered medications on file prior to visit.     PAST MEDICAL HISTORY: Past Medical History:  Diagnosis Date   Allergy    Asthma    B12 deficiency    Back pain    Bronchitis    hx   Colon polyp 10/06/2003   GERD (gastroesophageal reflux disease)    Hypertension    Low back pain    Osteoarthritis    Sinus trouble     PAST SURGICAL HISTORY: Past Surgical History:  Procedure Laterality Date   CHOLECYSTECTOMY     COLONOSCOPY     COLONOSCOPY  W/ BIOPSIES  10/06/2003   LUMBAR FUSION  2008   PARTIAL HIP ARTHROPLASTY Right 2009    hip replacement   TONSILLECTOMY     TOTAL KNEE ARTHROPLASTY Right 11/16/2013   Procedure: RIGHT TOTAL KNEE ARTHROPLASTY;  Surgeon: Vickey Huger, MD;  Location: [place];  Service: Orthopedics;  Laterality: Right;   VIDEO BRONCHOSCOPY Bilateral 12/05/2012   Procedure: VIDEO BRONCHOSCOPY WITHOUT FLUORO;  Surgeon: Collene Gobble, MD;  Location: [facility];  Service: Cardiopulmonary;  Laterality: Bilateral;    SOCIAL HISTORY: Social History  Substance Use Topics   Smoking status: Never Smoker   Smokeless tobacco: Never Used   Alcohol use No    FAMILY HISTORY: Family History  Problem Relation Age of Onset   COPD Father         died at age 55   Emphysema Father    Heart Problems Father    Heart disease Brother        stent with 90%   COPD Mother    Heart disease Mother    Ovarian cancer Mother    Emphysema Mother    Hypertension Mother    Hyperlipidemia Mother    Heart Problems Mother    Stroke Mother    Thyroid disease Mother    Cancer Mother    Colon cancer Neg Hx    Esophageal cancer Neg Hx    Rectal cancer Neg Hx    Stomach cancer Neg Hx     ROS: Review of Systems  Constitutional: Positive for weight loss.  Gastrointestinal: Negative for nausea and vomiting.  Musculoskeletal:       Negative muscle weakness    PHYSICAL EXAM: Blood pressure 112/73, pulse 78, temperature 98.6 F (37 C), temperature source Oral, height 5\' 3"  (1.6 m), weight 181 lb (82.1 kg), SpO2 97 %. Body mass index is 32.06 kg/m. Physical Exam  Constitutional: She is oriented to person, place, and time. She appears well-developed and well-nourished.  Cardiovascular: Normal rate.   Pulmonary/Chest: Effort normal.  Musculoskeletal: Normal range of motion.  Neurological: She is oriented to person, place, and time.  Skin: Skin is warm and dry.  Psychiatric: She has a normal mood and affect. Her behavior is normal.  Vitals reviewed.   RECENT LABS AND TESTS: BMET    Component Value Date/Time   NA 139 07/30/2016 1439   NA 139 07/25/2016 0917   K 3.7 07/30/2016 1439   CL 105 07/30/2016 1439   CO2 24 07/30/2016 1439   GLUCOSE 58 (L) 07/30/2016 1439   BUN 16 07/30/2016 1439   BUN 15 07/25/2016 0917   CREATININE 0.65 07/30/2016 1439   CALCIUM 9.4 07/30/2016 1439   GFRNONAA 85 07/25/2016 0917   GFRAA 98 07/25/2016 0917   Lab Results  Component Value Date   HGBA1C 6.0 (H) 07/25/2016   HGBA1C 5.8 (H) 04/17/2016   Lab Results  Component Value Date   INSULIN 19.7 07/25/2016   INSULIN 13.9 04/17/2016   CBC    Component Value Date/Time   WBC 9.4 07/30/2016 1439   RBC 4.40 07/30/2016 1439    HGB 12.2 07/30/2016 1439   HGB 12.7 04/17/2016 1150   HCT 37.2 07/30/2016 1439   HCT 39.5 04/17/2016 1150   PLT 283.0 07/30/2016 1439   MCV 84.4 07/30/2016 1439   MCV 84 04/17/2016 1150   MCH 27.1 04/17/2016 1150   MCH 29.3 11/18/2013 0705   MCHC 32.9 07/30/2016 1439   RDW 15.9 (H) 07/30/2016 1439   RDW 16.0 (  H) 04/17/2016 1150   LYMPHSABS 3.5 07/30/2016 1439   LYMPHSABS 2.7 04/17/2016 1150   MONOABS 0.7 07/30/2016 1439   EOSABS 0.4 07/30/2016 1439   EOSABS 0.2 04/17/2016 1150   BASOSABS 0.1 07/30/2016 1439   BASOSABS 0.0 04/17/2016 1150   Iron/TIBC/Ferritin/ %Sat No results found for: IRON, TIBC, FERRITIN, IRONPCTSAT Lipid Panel     Component Value Date/Time   CHOL 183 07/25/2016 0917   TRIG 158 (H) 07/25/2016 0917   HDL 57 07/25/2016 0917   CHOLHDL 3 10/24/2015 1054   VLDL 21.6 10/24/2015 1054   LDLCALC 94 07/25/2016 0917   LDLDIRECT 149.9 09/11/2010 0908   Hepatic Function Panel     Component Value Date/Time   PROT 6.9 07/25/2016 0917   ALBUMIN 4.1 07/25/2016 0917   AST 23 07/25/2016 0917   ALT 24 07/25/2016 0917   ALKPHOS 78 07/25/2016 0917   BILITOT 0.4 07/25/2016 0917   BILIDIR 0.0 10/24/2015 1054      Component Value Date/Time   TSH 1.890 04/17/2016 1150   TSH 1.15 10/24/2015 1054   TSH 1.30 04/25/2015 1132    ASSESSMENT AND PLAN: Mild intermittent asthma, unspecified whether complicated  Vitamin D deficiency - Plan: Vitamin D, Ergocalciferol, (DRISDOL) 50000 units CAPS capsule  Class 1 obesity with serious comorbidity and body mass index (BMI) of 32.0 to 32.9 in adult, unspecified obesity type  PLAN:  Vitamin D Deficiency Brandy Dunlap was informed that low vitamin D levels contributes to fatigue and are associated with obesity, breast, and colon cancer. She agrees to continue to take prescription Vit D @50 ,000 IU every week, we will refill for 1 month and will follow up for routine testing of vitamin D, at least 2-3 times per year. She was informed of  the risk of over-replacement of vitamin D and agrees to not increase her dose unless he discusses this with [name] first. Brandy Dunlap agrees to follow up with our clinic in 2 weeks.  Asthma Brandy Dunlap agrees to continue Xolair injections and follow up with Pulmonologist as planned and will follow up with our clinic in 2 weeks. Her VO2 is now at appropriate levels.  Obesity Brandy Dunlap is currently in the action stage of change. As such, her goal is to continue with weight loss efforts She has agreed to keep a food journal with 1200 calories and 75 grams of protein daily Brandy Dunlap has been instructed to work up to a goal of 150 minutes of combined cardio and strengthening exercise per week for weight loss and overall health benefits. We discussed the following Behavioral Modification Strategies today: increasing lean protein intake and meal planning & cooking strategies  Brandy Dunlap has agreed to follow up with our clinic in 2 weeks. She was informed of the importance of frequent follow up visits to maximize her success with intensive lifestyle modifications for her multiple health conditions.  Brandy Dunlap, am acting as transcriptionist for  Lacy Duverney, [place]  I have reviewed the above documentation for accuracy and completeness and discussed the patient with Lacy Duverney. I agree with the above plan. -Dennard Nip, MD  OBESITY BEHAVIORAL INTERVENTION VISIT  Today's visit was # 9 out of 22.  Starting weight: 195 lbs Starting date: 04/17/16 Today's weight : 195 lbs Today's date: 08/27/2016 Total lbs lost to date: 14 (Patients must lose 7 lbs in the first 6 months to continue with counseling)   ASK: We discussed the diagnosis of obesity with Brandy Dunlap today and Brandy Dunlap agreed to give [name] permission to  discuss obesity behavioral modification therapy today.  ASSESS: Brandy Dunlap has the diagnosis of obesity and her BMI today is 32.1 Brandy Dunlap is in the action stage of change   ADVISE: Brandy Dunlap was educated on the multiple health  risks of obesity as well as the benefit of weight loss to improve her health. She was advised of the need for long term treatment and the importance of lifestyle modifications.  AGREE: Multiple dietary modification options and treatment options were discussed and  Brandy Dunlap agreed to keep a food journal with 1200 calories and 75 grams of protein daily We discussed the following Behavioral Modification Strategies today: increasing lean protein intake and meal planning & cooking strategies

## 2016-09-07 ENCOUNTER — Other Ambulatory Visit: Payer: Self-pay | Admitting: Family

## 2016-09-10 ENCOUNTER — Other Ambulatory Visit: Payer: Self-pay | Admitting: *Deleted

## 2016-09-10 ENCOUNTER — Ambulatory Visit (INDEPENDENT_AMBULATORY_CARE_PROVIDER_SITE_OTHER): Payer: Medicare Other | Admitting: Physician Assistant

## 2016-09-10 MED ORDER — AMLODIPINE BESYLATE 5 MG PO TABS: 5.0000 mg | ORAL_TABLET | Freq: Every day | ORAL | 1 refills | Status: DC

## 2016-09-10 NOTE — Progress Notes (Signed)
Refill sent per [facility] refill protocol/SLS  

## 2016-09-17 ENCOUNTER — Ambulatory Visit (INDEPENDENT_AMBULATORY_CARE_PROVIDER_SITE_OTHER): Payer: Medicare Other | Admitting: Physician Assistant

## 2016-09-17 VITALS — BP 116/71 | HR 75 | Temp 98.6°F | Ht 63.0 in | Wt 185.0 lb

## 2016-09-17 DIAGNOSIS — R7303 Prediabetes: Secondary | ICD-10-CM

## 2016-09-17 DIAGNOSIS — E669 Obesity, unspecified: Secondary | ICD-10-CM

## 2016-09-17 DIAGNOSIS — F3289 Other specified depressive episodes: Secondary | ICD-10-CM

## 2016-09-17 DIAGNOSIS — Z9189 Other specified personal risk factors, not elsewhere classified: Secondary | ICD-10-CM

## 2016-09-17 DIAGNOSIS — E559 Vitamin D deficiency, unspecified: Secondary | ICD-10-CM

## 2016-09-17 DIAGNOSIS — Z6832 Body mass index (BMI) 32.0-32.9, adult: Secondary | ICD-10-CM | POA: Diagnosis not present

## 2016-09-17 MED ORDER — BUPROPION HCL ER (SR) 150 MG PO TB12: 150.0000 mg | ORAL_TABLET | Freq: Every day | ORAL | 0 refills | Status: DC

## 2016-09-17 MED ORDER — METFORMIN HCL 500 MG PO TABS: 500.0000 mg | ORAL_TABLET | Freq: Every day | ORAL | 0 refills | Status: DC

## 2016-09-17 MED ORDER — VITAMIN D (ERGOCALCIFEROL) 1.25 MG (50000 UNIT) PO CAPS: 50000.0000 [IU] | ORAL_CAPSULE | ORAL | 0 refills | Status: DC

## 2016-09-17 NOTE — Progress Notes (Signed)
Office: [phone]  /  Fax: [phone]   HPI:   Chief Complaint: OBESITY Brandy Dunlap is here to discuss her progress with her obesity treatment plan. She is on the  keep a food journal with 1200 calories and 75+g protein  and is following her eating plan approximately 70 % of the time. She states she is swimming and doing aerobics for exercise 60 minutes 5 times per week. Brandy Dunlap struggled with weight loss the past 2 weeks. Has had increase in craves and states hunger has not been well controlled.  Her weight is 185 lb (83.9 kg) today and has gained 4 pounds  since her last visit. She has lost 10 lbs since starting treatment with [name].  Pre-Diabetes Brandy Dunlap has a diagnosis of prediabetes based on her elevated HgA1c and was informed this puts her at greater risk of developing diabetes. She is taking metformin currently but has decreased to 500 once daily due to diarrhea,  and continues to work on diet and exercise to decrease risk of diabetes. She denies nausea or hypoglycemia.  Vitamin D deficiency Brandy Dunlap has a diagnosis of vitamin D deficiency. She is currently taking vit D and denies nausea, vomiting or muscle weakness.  At risk for diabetes Brandy Dunlap is at higher than averagerisk for developing diabetes due to her obesity. She currently denies polyuria or polydipsia.  Depression with emotional eating behaviors Brandy Dunlap is struggling with emotional eating and using food for comfort to the extent that it is negatively impacting her health. She often snacks when she is not hungry. Brandy Dunlap sometimes feels she is out of control and then feels guilty that she made poor food choices. She has been working on behavior modification techniques to help reduce her emotional eating and has been somewhat successful. She shows no sign of suicidal or homicidal ideations.  Depression screen [facility] 2/9 04/17/2016 10/24/2015 10/25/2014  Decreased Interest 1 0 0  Down, Depressed, Hopeless 0 0 0  PHQ - 2 Score 1 0 0  Altered sleeping 1  - -  Tired, decreased energy 2 - -  Change in appetite 1 - -  Feeling bad or failure about yourself  1 - -  Trouble concentrating 1 - -  Moving slowly or fidgety/restless 0 - -  Suicidal thoughts 0 - -  PHQ-9 Score 7 - -       ALLERGIES: Allergies  Allergen Reactions  . Pantoprazole Sodium Other (See Comments)    Racing heart    MEDICATIONS: Current Outpatient Prescriptions on File Prior to Visit  Medication Sig Dispense Refill  . amLODipine (NORVASC) 5 MG tablet Take 1 tablet (5 mg total) by mouth daily. 90 tablet 1  . budesonide-formoterol (SYMBICORT) 160-4.5 MCG/ACT inhaler Inhale 2 puffs into the lungs 2 (two) times daily.    [name] DEXILANT 60 MG capsule TAKE 1 CAPSULE BY MOUTH EVERY DAY 90 capsule 1  . diclofenac sodium (VOLTAREN) 1 % GEL APPLY (2G) BY TOPICAL ROUTE 4 TIMES EVERY DAY TO THE AFFECTED AREA(S)  1  . EPIPEN 2-PAK 0.3 MG/0.3ML SOAJ injection   1  . montelukast (SINGULAIR) 10 MG tablet Take 10 mg by mouth every evening.  5  . Multiple Vitamin (MULTIVITAMIN) capsule Take 1 capsule by mouth daily.      [name] omalizumab (XOLAIR) 150 MG injection Inject 150 mg into the skin every 14 (fourteen) days.    [name] PROAIR RESPICLICK 884 (90 BASE) MCG/ACT AEPB Inhale 2 puffs into the lungs every 6 (six) hours as needed.  0  . ciprofloxacin (CIPRO) 250 MG tablet Take 1 tablet (250 mg total) by mouth 2 (two) times daily. (Patient not taking: Reported on 08/27/2016) 14 tablet 0  . mometasone (NASONEX) 50 MCG/ACT nasal spray Place 2 sprays into the nose daily. (Patient not taking: Reported on 09/17/2016) 17 g 12  . traMADol (ULTRAM) 50 MG tablet TAKE 1-2 TABLET BY ORAL ROUTE EVERY 6 HOURS AS NEEDED  1   No current facility-administered medications on file prior to visit.     PAST MEDICAL HISTORY: Past Medical History:  Diagnosis Date  . Allergy   . Asthma   . B12 deficiency   . Back pain   . Bronchitis    hx  . Colon polyp 10/06/2003  . GERD (gastroesophageal reflux disease)   .  Hypertension   . Low back pain   . Osteoarthritis   . Sinus trouble     PAST SURGICAL HISTORY: Past Surgical History:  Procedure Laterality Date  . CHOLECYSTECTOMY    . COLONOSCOPY    . COLONOSCOPY W/ BIOPSIES  10/06/2003  . LUMBAR FUSION  2008  . PARTIAL HIP ARTHROPLASTY Right 2009    hip replacement  . TONSILLECTOMY    . TOTAL KNEE ARTHROPLASTY Right 11/16/2013   Procedure: RIGHT TOTAL KNEE ARTHROPLASTY;  Surgeon: Vickey Huger, MD;  Location: [place];  Service: Orthopedics;  Laterality: Right;  [name] VIDEO BRONCHOSCOPY Bilateral 12/05/2012   Procedure: VIDEO BRONCHOSCOPY WITHOUT FLUORO;  Surgeon: Collene Gobble, MD;  Location: [facility];  Service: Cardiopulmonary;  Laterality: Bilateral;    SOCIAL HISTORY: Social History  Substance Use Topics  . Smoking status: Never Smoker  . Smokeless tobacco: Never Used  . Alcohol use No    FAMILY HISTORY: Family History  Problem Relation Age of Onset  . COPD Father        died at age 47  . Emphysema Father   . Heart Problems Father   . Heart disease Brother        stent with 90%  . COPD Mother   . Heart disease Mother   . Ovarian cancer Mother   . Emphysema Mother   . Hypertension Mother   . Hyperlipidemia Mother   . Heart Problems Mother   . Stroke Mother   . Thyroid disease Mother   . Cancer Mother   . Colon cancer Neg Hx   . Esophageal cancer Neg Hx   . Rectal cancer Neg Hx   . Stomach cancer Neg Hx     ROS: Review of Systems  Gastrointestinal: Negative for nausea and vomiting.  Musculoskeletal: Negative.        Muscle weakness  Endo/Heme/Allergies: Negative for polydipsia.       Positive polyphagia Negative hypoglycemia or polyuria   Psychiatric/Behavioral: Positive for depression. Negative for suicidal ideas.    PHYSICAL EXAM: Blood pressure 116/71, pulse 75, temperature 98.6 F (37 C), temperature source Oral, height 5\' 3"  (1.6 m), weight 185 lb (83.9 kg), SpO2 96 %. Body mass index is 32.77 kg/m. Physical  Exam  Constitutional: She is oriented to person, place, and time. She appears well-developed and well-nourished.  Cardiovascular: Normal rate.   Pulmonary/Chest: Effort normal.  Musculoskeletal: Normal range of motion.  Neurological: She is alert and oriented to person, place, and time.  Skin: Skin is warm and dry.  Psychiatric: She has a normal mood and affect.    RECENT LABS AND TESTS: BMET    Component Value Date/Time   NA 139 07/30/2016 1439  NA 139 07/25/2016 0917   K 3.7 07/30/2016 1439   CL 105 07/30/2016 1439   CO2 24 07/30/2016 1439   GLUCOSE 58 (L) 07/30/2016 1439   BUN 16 07/30/2016 1439   BUN 15 07/25/2016 0917   CREATININE 0.65 07/30/2016 1439   CALCIUM 9.4 07/30/2016 1439   GFRNONAA 85 07/25/2016 0917   GFRAA 98 07/25/2016 0917   Lab Results  Component Value Date   HGBA1C 6.0 (H) 07/25/2016   HGBA1C 5.8 (H) 04/17/2016   Lab Results  Component Value Date   INSULIN 19.7 07/25/2016   INSULIN 13.9 04/17/2016   CBC    Component Value Date/Time   WBC 9.4 07/30/2016 1439   RBC 4.40 07/30/2016 1439   HGB 12.2 07/30/2016 1439   HGB 12.7 04/17/2016 1150   HCT 37.2 07/30/2016 1439   HCT 39.5 04/17/2016 1150   PLT 283.0 07/30/2016 1439   MCV 84.4 07/30/2016 1439   MCV 84 04/17/2016 1150   MCH 27.1 04/17/2016 1150   MCH 29.3 11/18/2013 0705   MCHC 32.9 07/30/2016 1439   RDW 15.9 (H) 07/30/2016 1439   RDW 16.0 (H) 04/17/2016 1150   LYMPHSABS 3.5 07/30/2016 1439   LYMPHSABS 2.7 04/17/2016 1150   MONOABS 0.7 07/30/2016 1439   EOSABS 0.4 07/30/2016 1439   EOSABS 0.2 04/17/2016 1150   BASOSABS 0.1 07/30/2016 1439   BASOSABS 0.0 04/17/2016 1150   Iron/TIBC/Ferritin/ %Sat No results found for: IRON, TIBC, FERRITIN, IRONPCTSAT Lipid Panel     Component Value Date/Time   CHOL 183 07/25/2016 0917   TRIG 158 (H) 07/25/2016 0917   HDL 57 07/25/2016 0917   CHOLHDL 3 10/24/2015 1054   VLDL 21.6 10/24/2015 1054   LDLCALC 94 07/25/2016 0917   LDLDIRECT 149.9  09/11/2010 0908   Hepatic Function Panel     Component Value Date/Time   PROT 6.9 07/25/2016 0917   ALBUMIN 4.1 07/25/2016 0917   AST 23 07/25/2016 0917   ALT 24 07/25/2016 0917   ALKPHOS 78 07/25/2016 0917   BILITOT 0.4 07/25/2016 0917   BILIDIR 0.0 10/24/2015 1054      Component Value Date/Time   TSH 1.890 04/17/2016 1150   TSH 1.15 10/24/2015 1054   TSH 1.30 04/25/2015 1132    ASSESSMENT AND PLAN: Prediabetes - Plan: metFORMIN (GLUCOPHAGE) 500 MG tablet  Vitamin D deficiency - Plan: Vitamin D, Ergocalciferol, (DRISDOL) 50000 units CAPS capsule  Other depression - Plan: buPROPion (WELLBUTRIN SR) 150 MG 12 hr tablet  At risk for diabetes mellitus  Class 1 obesity with serious comorbidity and body mass index (BMI) of 32.0 to 32.9 in adult, unspecified obesity type  PLAN:  Pre-Diabetes Brandy Dunlap will continue to work on weight loss, exercise, and decreasing simple carbohydrates in her diet to help decrease the risk of diabetes. We dicussed metformin including benefits and risks. She was informed that eating too many simple carbohydrates or too many calories at one sitting increases the likelihood of GI side effects. Brandy Dunlap requested metformin for now and a prescription was written today. Brandy Dunlap agreed to follow up with [name] as directed to monitor her progress.  Vitamin D Deficiency Brandy Dunlap was informed that low vitamin D levels contributes to fatigue and are associated with obesity, breast, and colon cancer. She agrees to continue to take prescription Vit D @50 ,000 IU every week, refill was written today, and will follow up for routine testing of vitamin D, at least 2-3 times per year. She was informed of the risk of over-replacement of  vitamin D and agrees to not increase her dose unless he discusses this with [name] first.  Depression with Emotional Eating Behaviors We discussed behavior modification techniques today to help Brandy Dunlap deal with her emotional eating and depression. She has agreed to  start Wellbutrin SR 150 mg qd and agreed to follow up as directed.  Diabetes risk counselling Brandy Dunlap was given extended (15 minutes) diabetes prevention counseling today. She is 66 y.o. female and has risk factors for diabetes including obesity. We discussed intensive lifestyle modifications today with an emphasis on weight loss as well as increasing exercise and decreasing simple carbohydrates in her diet.  Obesity Brandy Dunlap is currently in the action stage of change. As such, her goal is to continue with weight loss efforts She has agreed to follow the Category 2 plan Brandy Dunlap has been instructed to work up to a goal of 150 minutes of combined cardio and strengthening exercise per week for weight loss and overall health benefits. We discussed the following Behavioral Modification Stratagies today: increasing lean protein intake and work on meal planning and easy cooking plans   Brandy Dunlap has agreed to follow up with our clinic in 2 weeks. She was informed of the importance of frequent follow up visits to maximize her success with intensive lifestyle modifications for her multiple health conditions.   Office: [phone]  /  Fax: [phone]  OBESITY BEHAVIORAL INTERVENTION VISIT  Today's visit was # 10 out of 22.  Starting weight: 195 Starting date: 34.6 Today's weight : Weight: 185 lb (83.9 kg)  Today's date: 09/17/2016 Total lbs lost to date: 10 (Patients must lose 7 lbs in the first 6 months to continue with counseling)   ASK: We discussed the diagnosis of obesity with Annetta Maw today and Brandy Dunlap agreed to give [name] permission to discuss obesity behavioral modification therapy today.  ASSESS: Brandy Dunlap has the diagnosis of obesity and her BMI today is 32.8 Brandy Dunlap is in the action stage of change   ADVISE: Damien was educated on the multiple health risks of obesity as well as the benefit of weight loss to improve her health. She was advised of the need for long term treatment and the importance of  lifestyle modifications.  AGREE: Multiple dietary modification options and treatment options were discussed and  Brandy Dunlap agreed to follow the Category 2 plan We discussed the following Behavioral Modification Stratagies today: increasing lean protein intake and work on meal planning and easy cooking plans      I have reviewed the above documentation for accuracy and completeness, and I agree with the above. -Lacy Duverney, PA-C  I have reviewed the above note and agree with the plan. -Dennard Nip, MD

## 2016-10-01 ENCOUNTER — Ambulatory Visit (INDEPENDENT_AMBULATORY_CARE_PROVIDER_SITE_OTHER): Payer: Medicare Other | Admitting: Physician Assistant

## 2016-10-01 VITALS — BP 102/67 | HR 69 | Temp 98.1°F | Ht 63.0 in | Wt 183.0 lb

## 2016-10-01 DIAGNOSIS — F32A Depression, unspecified: Secondary | ICD-10-CM | POA: Insufficient documentation

## 2016-10-01 DIAGNOSIS — I10 Essential (primary) hypertension: Secondary | ICD-10-CM | POA: Diagnosis not present

## 2016-10-01 DIAGNOSIS — Z6832 Body mass index (BMI) 32.0-32.9, adult: Secondary | ICD-10-CM

## 2016-10-01 DIAGNOSIS — F329 Major depressive disorder, single episode, unspecified: Secondary | ICD-10-CM | POA: Insufficient documentation

## 2016-10-01 DIAGNOSIS — E669 Obesity, unspecified: Secondary | ICD-10-CM

## 2016-10-01 DIAGNOSIS — F3289 Other specified depressive episodes: Secondary | ICD-10-CM

## 2016-10-01 HISTORY — DX: Depression, unspecified: F32.A

## 2016-10-01 HISTORY — DX: Major depressive disorder, single episode, unspecified: F32.9

## 2016-10-01 MED ORDER — BUPROPION HCL ER (SR) 200 MG PO TB12: 200.0000 mg | ORAL_TABLET | Freq: Every morning | ORAL | 0 refills | Status: DC

## 2016-10-01 NOTE — Progress Notes (Signed)
Office: [phone]  /  Fax: [phone]   HPI:   Chief Complaint: OBESITY Brandy Dunlap is here to discuss her progress with her obesity treatment plan. She is on the Category 2 plan and is following her eating plan approximately 85 % of the time. She states she is exercising aerobics Monday and Friday and Swimming on Tuesday, Wednesday and Thursday for 30 to 45 minutes 5 times per week. Malva continues to do well with weight loss and plans ahead well. She would like more variety for dinner. Her weight is 183 lb (83 kg) today and has had a weight loss of 2 pounds over a period of 2 weeks since her last visit. She has lost 12 lbs since starting treatment with [name].  Hypertension Brandy Dunlap is a 66 y.o. female with hypertension.  Brandy Dunlap denies chest pain or shortness of breath on exertion. She is working weight loss to help control her blood pressure with the goal of decreasing her risk of heart attack and stroke. Brandy Dunlap blood pressure is currently controlled.  Depression with emotional eating behaviors Brandy Dunlap is struggling with emotional eating and using food for comfort to the extent that it is negatively impacting her health. She often snacks when she is not hungry. Brandy Dunlap sometimes feels she is out of control and then feels guilty that she made poor food choices. She has been working on behavior modification techniques to help reduce her emotional eating and has been somewhat successful. She shows no sign of suicidal or homicidal ideations.  Depression screen [facility] 2/9 04/17/2016 10/24/2015 10/25/2014  Decreased Interest 1 0 0  Down, Depressed, Hopeless 0 0 0  PHQ - 2 Score 1 0 0  Altered sleeping 1 - -  Tired, decreased energy 2 - -  Change in appetite 1 - -  Feeling bad or failure about yourself  1 - -  Trouble concentrating 1 - -  Moving slowly or fidgety/restless 0 - -  Suicidal thoughts 0 - -  PHQ-9 Score 7 - -      ALLERGIES: Allergies  Allergen Reactions  . Pantoprazole  Sodium Other (See Comments)    Racing heart    MEDICATIONS: Current Outpatient Prescriptions on File Prior to Visit  Medication Sig Dispense Refill  . amLODipine (NORVASC) 5 MG tablet Take 1 tablet (5 mg total) by mouth daily. 90 tablet 1  . budesonide-formoterol (SYMBICORT) 160-4.5 MCG/ACT inhaler Inhale 2 puffs into the lungs 2 (two) times daily.    [name] DEXILANT 60 MG capsule TAKE 1 CAPSULE BY MOUTH EVERY DAY 90 capsule 1  . diclofenac sodium (VOLTAREN) 1 % GEL APPLY (2G) BY TOPICAL ROUTE 4 TIMES EVERY DAY TO THE AFFECTED AREA(S)  1  . EPIPEN 2-PAK 0.3 MG/0.3ML SOAJ injection   1  . metFORMIN (GLUCOPHAGE) 500 MG tablet Take 1 tablet (500 mg total) by mouth daily with breakfast. 30 tablet 0  . montelukast (SINGULAIR) 10 MG tablet Take 10 mg by mouth every evening.  5  . Multiple Vitamin (MULTIVITAMIN) capsule Take 1 capsule by mouth daily.      [name] omalizumab (XOLAIR) 150 MG injection Inject 150 mg into the skin every 14 (fourteen) days.    [name] PROAIR RESPICLICK 941 (90 BASE) MCG/ACT AEPB Inhale 2 puffs into the lungs every 6 (six) hours as needed.  0  . Vitamin D, Ergocalciferol, (DRISDOL) 50000 units CAPS capsule Take 1 capsule (50,000 Units total) by mouth every 7 (seven) days. 4 capsule 0   No  current facility-administered medications on file prior to visit.     PAST MEDICAL HISTORY: Past Medical History:  Diagnosis Date  . Allergy   . Asthma   . B12 deficiency   . Back pain   . Bronchitis    hx  . Colon polyp 10/06/2003  . GERD (gastroesophageal reflux disease)   . Hypertension   . Low back pain   . Osteoarthritis   . Sinus trouble     PAST SURGICAL HISTORY: Past Surgical History:  Procedure Laterality Date  . CHOLECYSTECTOMY    . COLONOSCOPY    . COLONOSCOPY W/ BIOPSIES  10/06/2003  . LUMBAR FUSION  2008  . PARTIAL HIP ARTHROPLASTY Right 2009    hip replacement  . TONSILLECTOMY    . TOTAL KNEE ARTHROPLASTY Right 11/16/2013   Procedure: RIGHT TOTAL KNEE ARTHROPLASTY;   Surgeon: Vickey Huger, MD;  Location: [place];  Service: Orthopedics;  Laterality: Right;  [name] VIDEO BRONCHOSCOPY Bilateral 12/05/2012   Procedure: VIDEO BRONCHOSCOPY WITHOUT FLUORO;  Surgeon: Collene Gobble, MD;  Location: [facility];  Service: Cardiopulmonary;  Laterality: Bilateral;    SOCIAL HISTORY: Social History  Substance Use Topics  . Smoking status: Never Smoker  . Smokeless tobacco: Never Used  . Alcohol use No    FAMILY HISTORY: Family History  Problem Relation Age of Onset  . COPD Father        died at age 29  . Emphysema Father   . Heart Problems Father   . Heart disease Brother        stent with 90%  . COPD Mother   . Heart disease Mother   . Ovarian cancer Mother   . Emphysema Mother   . Hypertension Mother   . Hyperlipidemia Mother   . Heart Problems Mother   . Stroke Mother   . Thyroid disease Mother   . Cancer Mother   . Colon cancer Neg Hx   . Esophageal cancer Neg Hx   . Rectal cancer Neg Hx   . Stomach cancer Neg Hx     ROS: Review of Systems  Constitutional: Positive for weight loss.  Respiratory: Negative for shortness of breath (on exertion).   Cardiovascular: Negative for chest pain.  Psychiatric/Behavioral: Positive for depression. Negative for suicidal ideas.    PHYSICAL EXAM: Blood pressure 102/67, pulse 69, temperature 98.1 F (36.7 C), temperature source Oral, height 5\' 3"  (1.6 m), weight 183 lb (83 kg), SpO2 98 %. Body mass index is 32.42 kg/m. Physical Exam  Constitutional: She is oriented to person, place, and time. She appears well-developed and well-nourished.  Cardiovascular: Normal rate.   Musculoskeletal: Normal range of motion.  Neurological: She is oriented to person, place, and time.  Skin: Skin is warm and dry.  Psychiatric: She has a normal mood and affect. Her behavior is normal.  Vitals reviewed.   RECENT LABS AND TESTS: BMET    Component Value Date/Time   NA 139 07/30/2016 1439   NA 139 07/25/2016 0917   K  3.7 07/30/2016 1439   CL 105 07/30/2016 1439   CO2 24 07/30/2016 1439   GLUCOSE 58 (L) 07/30/2016 1439   BUN 16 07/30/2016 1439   BUN 15 07/25/2016 0917   CREATININE 0.65 07/30/2016 1439   CALCIUM 9.4 07/30/2016 1439   GFRNONAA 85 07/25/2016 0917   GFRAA 98 07/25/2016 0917   Lab Results  Component Value Date   HGBA1C 6.0 (H) 07/25/2016   HGBA1C 5.8 (H) 04/17/2016   Lab Results  Component Value Date   INSULIN 19.7 07/25/2016   INSULIN 13.9 04/17/2016   CBC    Component Value Date/Time   WBC 9.4 07/30/2016 1439   RBC 4.40 07/30/2016 1439   HGB 12.2 07/30/2016 1439   HGB 12.7 04/17/2016 1150   HCT 37.2 07/30/2016 1439   HCT 39.5 04/17/2016 1150   PLT 283.0 07/30/2016 1439   MCV 84.4 07/30/2016 1439   MCV 84 04/17/2016 1150   MCH 27.1 04/17/2016 1150   MCH 29.3 11/18/2013 0705   MCHC 32.9 07/30/2016 1439   RDW 15.9 (H) 07/30/2016 1439   RDW 16.0 (H) 04/17/2016 1150   LYMPHSABS 3.5 07/30/2016 1439   LYMPHSABS 2.7 04/17/2016 1150   MONOABS 0.7 07/30/2016 1439   EOSABS 0.4 07/30/2016 1439   EOSABS 0.2 04/17/2016 1150   BASOSABS 0.1 07/30/2016 1439   BASOSABS 0.0 04/17/2016 1150   Iron/TIBC/Ferritin/ %Sat No results found for: IRON, TIBC, FERRITIN, IRONPCTSAT Lipid Panel     Component Value Date/Time   CHOL 183 07/25/2016 0917   TRIG 158 (H) 07/25/2016 0917   HDL 57 07/25/2016 0917   CHOLHDL 3 10/24/2015 1054   VLDL 21.6 10/24/2015 1054   LDLCALC 94 07/25/2016 0917   LDLDIRECT 149.9 09/11/2010 0908   Hepatic Function Panel     Component Value Date/Time   PROT 6.9 07/25/2016 0917   ALBUMIN 4.1 07/25/2016 0917   AST 23 07/25/2016 0917   ALT 24 07/25/2016 0917   ALKPHOS 78 07/25/2016 0917   BILITOT 0.4 07/25/2016 0917   BILIDIR 0.0 10/24/2015 1054      Component Value Date/Time   TSH 1.890 04/17/2016 1150   TSH 1.15 10/24/2015 1054   TSH 1.30 04/25/2015 1132    ASSESSMENT AND PLAN: Essential hypertension  Other depression - Plan: buPROPion  (WELLBUTRIN SR) 200 MG 12 hr tablet  Class 1 obesity with serious comorbidity and body mass index (BMI) of 32.0 to 32.9 in adult, unspecified obesity type  PLAN:  Hypertension We discussed sodium restriction, working on healthy weight loss, and a regular exercise program as the means to achieve improved blood pressure control. Brandy Dunlap agreed with this plan and agreed to follow up as directed. We will continue to monitor her blood pressure as well as her progress with the above lifestyle modifications. She will continue her medications as prescribed and will watch for signs of hypotension as she continues her lifestyle modifications.  Depression with Emotional Eating Behaviors We discussed behavior modification techniques today to help Brandy Dunlap deal with her emotional eating and depression. She has agreed to increase Wellbutrin SR to 200 mg qd #30 with no refills and will follow up as directed.   Obesity Brandy Dunlap is currently in the action stage of change. As such, her goal is to continue with weight loss efforts She has agreed to keep a food journal with 500 calories and 35+ grams of protein at supper and follow the Category 2 plan Brandy Dunlap has been instructed to work up to a goal of 150 minutes of combined cardio and strengthening exercise per week for weight loss and overall health benefits. We discussed the following Behavioral Modification Strategies today: increasing lean protein intake and planning for success  Brandy Dunlap has agreed to follow up with our clinic in 3 weeks. She was informed of the importance of frequent follow up visits to maximize her success with intensive lifestyle modifications for her multiple health conditions.  Brandy Dunlap, am acting as transcriptionist for [employer], PA-C  I have  reviewed the above documentation for accuracy and completeness, and I agree with the above. -Brandy Duverney, PA-C  I have reviewed the above note and agree with the plan. -Dennard Nip,  MD   OBESITY BEHAVIORAL INTERVENTION VISIT  Today's visit was # 11 out of 22.  Starting weight: 195 lbs Starting date: 04/17/16 Today's weight : 183 lbs Today's date: 10/01/2016 Total lbs lost to date: 12 (Patients must lose 7 lbs in the first 6 months to continue with counseling)   ASK: We discussed the diagnosis of obesity with Annetta Maw today and Kaylianna agreed to give [name] permission to discuss obesity behavioral modification therapy today.  ASSESS: Jimesha has the diagnosis of obesity and her BMI today is 32.5 Kabria is in the action stage of change   ADVISE: Rhegan was educated on the multiple health risks of obesity as well as the benefit of weight loss to improve her health. She was advised of the need for long term treatment and the importance of lifestyle modifications.  AGREE: Multiple dietary modification options and treatment options were discussed and  Armanda agreed to keep a food journal with 500 calories and 35+ grams of protein at supper daily and follow the Category 2 plan We discussed the following Behavioral Modification Strategies today: increasing lean protein intake and planning for success

## 2016-10-03 ENCOUNTER — Encounter (INDEPENDENT_AMBULATORY_CARE_PROVIDER_SITE_OTHER): Payer: Self-pay | Admitting: Family Medicine

## 2016-10-22 ENCOUNTER — Ambulatory Visit (INDEPENDENT_AMBULATORY_CARE_PROVIDER_SITE_OTHER): Payer: Medicare Other | Admitting: Physician Assistant

## 2016-10-22 VITALS — BP 126/72 | HR 74 | Temp 98.6°F | Ht 63.0 in | Wt 183.0 lb

## 2016-10-22 DIAGNOSIS — R7303 Prediabetes: Secondary | ICD-10-CM

## 2016-10-22 DIAGNOSIS — F3289 Other specified depressive episodes: Secondary | ICD-10-CM

## 2016-10-22 DIAGNOSIS — Z6831 Body mass index (BMI) 31.0-31.9, adult: Secondary | ICD-10-CM

## 2016-10-22 DIAGNOSIS — E669 Obesity, unspecified: Secondary | ICD-10-CM

## 2016-10-22 MED ORDER — METFORMIN HCL 500 MG PO TABS: 500.0000 mg | ORAL_TABLET | Freq: Every day | ORAL | 0 refills | Status: DC

## 2016-10-22 NOTE — Progress Notes (Signed)
.   Office: [phone]  /  Fax: [phone]   HPI:   Chief Complaint: OBESITY Brandy Dunlap is here to discuss her progress with her obesity treatment plan. She is on the keep a food journal with 500 calories and 35+ grams of protein at supper daily and follow the Category 2 plan and is following her eating plan approximately 85 % of the time. She states she is swimming for 30 minutes 3 times per week. Jesseca maintained her weight, despite increase in celebration eating. She would like more variety at breakfast. Her weight is 183 lb (83 kg) today and has maintained weight over a period of 3 weeks since her last visit. She has lost 12 lbs since starting treatment with [name].  Pre-Diabetes Brandy Dunlap has a diagnosis of pre-diabetes based on her elevated Hgb A1c and was informed this puts her at greater risk of developing diabetes. She is taking metformin currently and continues to work on diet and exercise to decrease risk of diabetes. She denies nausea, polyphagia  or hypoglycemia.  Depression with emotional eating behaviors Brandy Dunlap mood is stable. She states she hasn't felt any improvement in cravings after Wellbutrin and wants to stop taking Wellbutrin. Brandy Dunlap struggles with emotional eating and using food for comfort to the extent that it is negatively impacting her health. She often snacks when she is not hungry. Brandy Dunlap sometimes feels she is out of control and then feels guilty that she made poor food choices. She has been working on behavior modification techniques to help reduce her emotional eating and has been somewhat successful. She shows no sign of suicidal or homicidal ideations.  Depression screen Brandy Dunlap  Decreased Interest 1 0 0  Down, Depressed, Hopeless 0 0 0  PHQ - 2 Score 1 0 0  Altered sleeping 1 - -  Tired, decreased energy 2 - -  Change in appetite 1 - -  Feeling bad or failure about yourself  1 - -  Trouble concentrating 1 - -  Moving slowly or  fidgety/restless 0 - -  Suicidal thoughts 0 - -  PHQ-9 Score 7 - -     ALLERGIES: Allergies  Allergen Reactions  . Pantoprazole Sodium Other (See Comments)    Racing heart    MEDICATIONS: Current Outpatient Prescriptions on File Prior to Visit  Medication Sig Dispense Refill  . amLODipine (NORVASC) 5 MG tablet Take 1 tablet (5 mg total) by mouth daily. 90 tablet 1  . budesonide-formoterol (SYMBICORT) 160-4.5 MCG/ACT inhaler Inhale 2 puffs into the lungs 2 (two) times daily.    [name] buPROPion (WELLBUTRIN SR) 200 MG 12 hr tablet Take 1 tablet (200 mg total) by mouth every morning. 30 tablet 0  . DEXILANT 60 MG capsule TAKE 1 CAPSULE BY MOUTH EVERY DAY 90 capsule 1  . diclofenac sodium (VOLTAREN) 1 % GEL APPLY (2G) BY TOPICAL ROUTE 4 TIMES EVERY DAY TO THE AFFECTED AREA(S)  1  . EPIPEN 2-PAK 0.3 MG/0.3ML SOAJ injection   1  . metFORMIN (GLUCOPHAGE) 500 MG tablet Take 1 tablet (500 mg total) by mouth daily with breakfast. 30 tablet 0  . montelukast (SINGULAIR) 10 MG tablet Take 10 mg by mouth every evening.  5  . Multiple Vitamin (MULTIVITAMIN) capsule Take 1 capsule by mouth daily.      [name] omalizumab (XOLAIR) 150 MG injection Inject 150 mg into the skin every 14 (fourteen) days.    [name] PROAIR RESPICLICK 010 (90 BASE) MCG/ACT AEPB Inhale 2 puffs into  the lungs every 6 (six) hours as needed.  0  . Vitamin D, Ergocalciferol, (DRISDOL) 50000 units CAPS capsule Take 1 capsule (50,000 Units total) by mouth every 7 (seven) days. 4 capsule 0   No current facility-administered medications on file prior to visit.     PAST MEDICAL HISTORY: Past Medical History:  Diagnosis Date  . Allergy   . Asthma   . B12 deficiency   . Back pain   . Bronchitis    hx  . Colon polyp 10/06/2003  . GERD (gastroesophageal reflux disease)   . Hypertension   . Low back pain   . Osteoarthritis   . Sinus trouble     PAST SURGICAL HISTORY: Past Surgical History:  Procedure Laterality Date  . CHOLECYSTECTOMY      . COLONOSCOPY    . COLONOSCOPY W/ BIOPSIES  10/06/2003  . LUMBAR FUSION  2008  . PARTIAL HIP ARTHROPLASTY Right 2009    hip replacement  . TONSILLECTOMY    . TOTAL KNEE ARTHROPLASTY Right 11/16/2013   Procedure: RIGHT TOTAL KNEE ARTHROPLASTY;  Surgeon: Vickey Huger, MD;  Location: [facility];  Service: Orthopedics;  Laterality: Right;  [name] VIDEO BRONCHOSCOPY Bilateral 12/05/2012   Procedure: VIDEO BRONCHOSCOPY WITHOUT FLUORO;  Surgeon: Collene Gobble, MD;  Location: [facility];  Service: Cardiopulmonary;  Laterality: Bilateral;    SOCIAL HISTORY: Social History  Substance Use Topics  . Smoking status: Never Smoker  . Smokeless tobacco: Never Used  . Alcohol use No    FAMILY HISTORY: Family History  Problem Relation Age of Onset  . COPD Father        died at age 80  . Emphysema Father   . Heart Problems Father   . Heart disease Brother        stent with 90%  . COPD Mother   . Heart disease Mother   . Ovarian cancer Mother   . Emphysema Mother   . Hypertension Mother   . Hyperlipidemia Mother   . Heart Problems Mother   . Stroke Mother   . Thyroid disease Mother   . Cancer Mother   . Colon cancer Neg Hx   . Esophageal cancer Neg Hx   . Rectal cancer Neg Hx   . Stomach cancer Neg Hx     ROS: Review of Systems  Constitutional: Negative for weight loss.  Gastrointestinal: Negative for nausea.  Endo/Heme/Allergies:       Negative polyphagia Negative hypoglycemia  Psychiatric/Behavioral: Positive for depression. Negative for suicidal ideas.    PHYSICAL EXAM: Blood pressure 126/72, pulse 74, temperature 98.6 F (37 C), temperature source Oral, height 5\' 3"  (1.6 m), weight 183 lb (83 kg), SpO2 97 %. Body mass index is 32.42 kg/m. Physical Exam  Constitutional: She is oriented to person, place, and time. She appears well-developed and well-nourished.  Cardiovascular: Normal rate.   Pulmonary/Chest: Effort normal.  Musculoskeletal: Normal range of motion.   Neurological: She is oriented to person, place, and time.  Skin: Skin is warm.  Psychiatric: She has a normal mood and affect. Her behavior is normal.  Vitals reviewed.   RECENT LABS AND TESTS: BMET    Component Value Date/Time   NA 139 07/30/2016 1439   NA 139 07/25/2016 0917   K 3.7 07/30/2016 1439   CL 105 07/30/2016 1439   CO2 24 07/30/2016 1439   GLUCOSE 58 (L) 07/30/2016 1439   BUN 16 07/30/2016 1439   BUN 15 07/25/2016 0917   CREATININE 0.65 07/30/2016 1439  CALCIUM 9.4 07/30/2016 1439   GFRNONAA 85 07/25/2016 0917   GFRAA 98 07/25/2016 0917   Lab Results  Component Value Date   HGBA1C 6.0 (H) 07/25/2016   HGBA1C 5.8 (H) 04/17/2016   Lab Results  Component Value Date   INSULIN 19.7 07/25/2016   INSULIN 13.9 04/17/2016   CBC    Component Value Date/Time   WBC 9.4 07/30/2016 1439   RBC 4.40 07/30/2016 1439   HGB 12.2 07/30/2016 1439   HGB 12.7 04/17/2016 1150   HCT 37.2 07/30/2016 1439   HCT 39.5 04/17/2016 1150   PLT 283.0 07/30/2016 1439   MCV 84.4 07/30/2016 1439   MCV 84 04/17/2016 1150   MCH 27.1 04/17/2016 1150   MCH 29.3 11/18/2013 0705   MCHC 32.9 07/30/2016 1439   RDW 15.9 (H) 07/30/2016 1439   RDW 16.0 (H) 04/17/2016 1150   LYMPHSABS 3.5 07/30/2016 1439   LYMPHSABS 2.7 04/17/2016 1150   MONOABS 0.7 07/30/2016 1439   EOSABS 0.4 07/30/2016 1439   EOSABS 0.2 04/17/2016 1150   BASOSABS 0.1 07/30/2016 1439   BASOSABS 0.0 04/17/2016 1150   Iron/TIBC/Ferritin/ %Sat No results found for: IRON, TIBC, FERRITIN, IRONPCTSAT Lipid Panel     Component Value Date/Time   CHOL 183 07/25/2016 0917   TRIG 158 (H) 07/25/2016 0917   HDL 57 07/25/2016 0917   CHOLHDL 3 10/24/2015 1054   VLDL 21.6 10/24/2015 1054   LDLCALC 94 07/25/2016 0917   LDLDIRECT 149.9 09/11/2010 0908   Hepatic Function Panel     Component Value Date/Time   PROT 6.9 07/25/2016 0917   ALBUMIN 4.1 07/25/2016 0917   AST 23 07/25/2016 0917   ALT 24 07/25/2016 0917   ALKPHOS  78 07/25/2016 0917   BILITOT 0.4 07/25/2016 0917   BILIDIR 0.0 10/24/2015 1054      Component Value Date/Time   TSH 1.890 04/17/2016 1150   TSH 1.15 10/24/2015 1054   TSH 1.30 04/25/2015 1132    ASSESSMENT AND PLAN: Prediabetes - Plan: metFORMIN (GLUCOPHAGE) 500 MG tablet  Other depression  Class 1 obesity with serious comorbidity and body mass index (BMI) of 31.0 to 31.9 in adult, unspecified obesity type  PLAN:  Pre-Diabetes Kaycee will continue to work on weight loss, exercise, and decreasing simple carbohydrates in her diet to help decrease the risk of diabetes. We dicussed metformin including benefits and risks. She was informed that eating too many simple carbohydrates or too many calories at one sitting increases the likelihood of GI side effects. Karly agrees to continue metformin for now and a prescription was written today for 1 month refill. Lorraina agreed to follow up with [name] as directed to monitor her progress.  Depression with Emotional Eating Behaviors We discussed behavior modification techniques today to help Breyana deal with her emotional eating and depression. She was encouraged to taper off Wellbutrin, to take 1/2 pill for 3 weeks and follow up as directed.  Obesity Tierney is currently in the action stage of change. As such, her goal is to continue with weight loss efforts She has agreed to keep a food journal with 500 calories and 35 grams of protein at supper daily and follow the Category 2 plan Saiya has been instructed to work up to a goal of 150 minutes of combined cardio and strengthening exercise per week for weight loss and overall health benefits. We discussed the following Behavioral Modification Strategies today: meal planning & cooking strategies and increasing lean protein intake  Cadynce has agreed to  follow up with our clinic in 3 weeks. She was informed of the importance of frequent follow up visits to maximize her success with intensive lifestyle modifications  for her multiple health conditions.  I, Doreene Nest, am acting as transcriptionist for Lacy Duverney, PA-C  I have reviewed the above documentation for accuracy and completeness, and I agree with the above. -Lacy Duverney, PA-C  I have reviewed the above note and agree with the plan. -Dennard Nip, MD   OBESITY BEHAVIORAL INTERVENTION VISIT  Today's visit was # 12 out of 22.  Starting weight: 195 lbs Starting date: 04/17/16 Today's weight : 183 lbs Today's date: 10/22/2016 Total lbs lost to date: 12 (Patients must lose 7 lbs in the first 6 months to continue with counseling)   ASK: We discussed the diagnosis of obesity with Annetta Maw today and Felma agreed to give [name] permission to discuss obesity behavioral modification therapy today.  ASSESS: Frady has the diagnosis of obesity and her BMI today is 32.42 Brandy Dunlap is in the action stage of change   ADVISE: Niobe was educated on the multiple health risks of obesity as well as the benefit of weight loss to improve her health. She was advised of the need for long term treatment and the importance of lifestyle modifications.  AGREE: Multiple dietary modification options and treatment options were discussed and  Demiana agreed to keep a food journal with 500 calories and 35 grams of protein at supper daily and follow the Category 2 plan We discussed the following Behavioral Modification Strategies today: meal planning & cooking strategies and increasing lean protein intake

## 2016-11-02 ENCOUNTER — Telehealth: Payer: Self-pay | Admitting: Family

## 2016-11-02 NOTE — Telephone Encounter (Signed)
Pt dropped off document to be filled out by provider ([employer]-1 page) Pt would like to be called when ready to pick up at [facility] [phone]. Document put at front office tray.

## 2016-11-05 ENCOUNTER — Telehealth (INDEPENDENT_AMBULATORY_CARE_PROVIDER_SITE_OTHER): Payer: Self-pay | Admitting: Family Medicine

## 2016-11-05 ENCOUNTER — Other Ambulatory Visit (INDEPENDENT_AMBULATORY_CARE_PROVIDER_SITE_OTHER): Payer: Self-pay

## 2016-11-05 DIAGNOSIS — F3289 Other specified depressive episodes: Secondary | ICD-10-CM

## 2016-11-05 MED ORDER — BUPROPION HCL ER (SR) 200 MG PO TB12: 200.0000 mg | ORAL_TABLET | Freq: Every morning | ORAL | 0 refills | Status: DC

## 2016-11-05 NOTE — Telephone Encounter (Signed)
[facility] [address] - [phone] , Called requesting a refill on buPROPion (WELLBUTRIN SR) 200 MG 12 hr tablet. Thank you

## 2016-11-05 NOTE — Telephone Encounter (Signed)
We have sent a refill to [facility].   Brandy Dunlap

## 2016-11-09 NOTE — Telephone Encounter (Signed)
Completed as much as possible, attached 10/24/15, last CPE [sees Dr. Leafy Ro for most], and also, Immunization reports from [place] [No Hep B, MMR]; forwarded to provider/SLS 09/14

## 2016-11-12 ENCOUNTER — Other Ambulatory Visit: Payer: Self-pay | Admitting: Family

## 2016-11-12 ENCOUNTER — Encounter: Payer: Self-pay | Admitting: Family

## 2016-11-12 ENCOUNTER — Ambulatory Visit (INDEPENDENT_AMBULATORY_CARE_PROVIDER_SITE_OTHER): Payer: Medicare Other | Admitting: Physician Assistant

## 2016-11-12 VITALS — BP 120/74 | HR 70 | Temp 98.3°F | Ht 63.0 in | Wt 183.0 lb

## 2016-11-12 DIAGNOSIS — Z6832 Body mass index (BMI) 32.0-32.9, adult: Secondary | ICD-10-CM

## 2016-11-12 DIAGNOSIS — R7303 Prediabetes: Secondary | ICD-10-CM

## 2016-11-12 DIAGNOSIS — E669 Obesity, unspecified: Secondary | ICD-10-CM | POA: Diagnosis not present

## 2016-11-12 DIAGNOSIS — Z1231 Encounter for screening mammogram for malignant neoplasm of breast: Secondary | ICD-10-CM

## 2016-11-12 DIAGNOSIS — E559 Vitamin D deficiency, unspecified: Secondary | ICD-10-CM

## 2016-11-12 MED ORDER — VITAMIN D (ERGOCALCIFEROL) 1.25 MG (50000 UNIT) PO CAPS: 50000.0000 [IU] | ORAL_CAPSULE | ORAL | 0 refills | Status: DC

## 2016-11-12 NOTE — Telephone Encounter (Signed)
Noted  

## 2016-11-12 NOTE — Telephone Encounter (Signed)
Patient scheduled PPD nurse visit for 11/16/2016 at 9:45am.

## 2016-11-12 NOTE — Telephone Encounter (Signed)
See 11/02/16 phone note. Pt scheduled nurse visit for PPD for 11/16/16.

## 2016-11-12 NOTE — Progress Notes (Signed)
Office: [phone]  /  Fax: [phone]   HPI:   Chief Complaint: OBESITY Brandy Dunlap is here to discuss her progress with her obesity treatment plan. She is on the Category 2 plan and is following her eating plan approximately 75 % of the time. She states she is exercising 45 minutes 3 to 4 times per week. Brandy Dunlap has been sick with a sinus infection and has not been able to follow the plan. She finds herself skipping some meals. Brandy Dunlap is motivated to get back on track. Her weight is 183 lb (83 kg) today and has maintained weight over a period pf 3 weeks since her last visit. She has lost 12 lbs since starting treatment with [name].  Vitamin D deficiency Brandy Dunlap has a diagnosis of vitamin D deficiency. She is currently taking vit D and denies nausea, vomiting or muscle weakness.  Pre-Diabetes Brandy Dunlap has a diagnosis of pre-diabetes based on her elevated Hgb A1c and was informed this puts her at greater risk of developing diabetes. She is taking metformin currently and continues to work on diet and exercise to decrease risk of diabetes. She denies nausea, polyphagia or hypoglycemia.   ALLERGIES: Allergies  Allergen Reactions  . Pantoprazole Sodium Other (See Comments)    Racing heart    MEDICATIONS: Current Outpatient Prescriptions on File Prior to Visit  Medication Sig Dispense Refill  . amLODipine (NORVASC) 5 MG tablet Take 1 tablet (5 mg total) by mouth daily. 90 tablet 1  . budesonide-formoterol (SYMBICORT) 160-4.5 MCG/ACT inhaler Inhale 2 puffs into the lungs 2 (two) times daily.    [name] DEXILANT 60 MG capsule TAKE 1 CAPSULE BY MOUTH EVERY DAY 90 capsule 1  . diclofenac sodium (VOLTAREN) 1 % GEL APPLY (2G) BY TOPICAL ROUTE 4 TIMES EVERY DAY TO THE AFFECTED AREA(S)  1  . EPIPEN 2-PAK 0.3 MG/0.3ML SOAJ injection   1  . metFORMIN (GLUCOPHAGE) 500 MG tablet Take 1 tablet (500 mg total) by mouth daily with breakfast. 30 tablet 0  . montelukast (SINGULAIR) 10 MG tablet Take 10 mg by mouth every  evening.  5  . Multiple Vitamin (MULTIVITAMIN) capsule Take 1 capsule by mouth daily.      [name] omalizumab (XOLAIR) 150 MG injection Inject 150 mg into the skin every 14 (fourteen) days.    [name] PROAIR RESPICLICK 269 (90 BASE) MCG/ACT AEPB Inhale 2 puffs into the lungs every 6 (six) hours as needed.  0   No current facility-administered medications on file prior to visit.     PAST MEDICAL HISTORY: Past Medical History:  Diagnosis Date  . Allergy   . Asthma   . B12 deficiency   . Back pain   . Bronchitis    hx  . Colon polyp 10/06/2003  . GERD (gastroesophageal reflux disease)   . Hypertension   . Low back pain   . Osteoarthritis   . Sinus trouble     PAST SURGICAL HISTORY: Past Surgical History:  Procedure Laterality Date  . CHOLECYSTECTOMY    . COLONOSCOPY    . COLONOSCOPY W/ BIOPSIES  10/06/2003  . LUMBAR FUSION  2008  . PARTIAL HIP ARTHROPLASTY Right 2009    hip replacement  . TONSILLECTOMY    . TOTAL KNEE ARTHROPLASTY Right 11/16/2013   Procedure: RIGHT TOTAL KNEE ARTHROPLASTY;  Surgeon: Vickey Huger, MD;  Location: [facility];  Service: Orthopedics;  Laterality: Right;  [name] VIDEO BRONCHOSCOPY Bilateral 12/05/2012   Procedure: VIDEO BRONCHOSCOPY WITHOUT FLUORO;  Surgeon: Collene Gobble, MD;  Location: [facility];  Service: Cardiopulmonary;  Laterality: Bilateral;    SOCIAL HISTORY: Social History  Substance Use Topics  . Smoking status: Never Smoker  . Smokeless tobacco: Never Used  . Alcohol use No    FAMILY HISTORY: Family History  Problem Relation Age of Onset  . COPD Father        died at age 10  . Emphysema Father   . Heart Problems Father   . Heart disease Brother        stent with 90%  . COPD Mother   . Heart disease Mother   . Ovarian cancer Mother   . Emphysema Mother   . Hypertension Mother   . Hyperlipidemia Mother   . Heart Problems Mother   . Stroke Mother   . Thyroid disease Mother   . Cancer Mother   . Colon cancer Neg Hx   . Esophageal cancer  Neg Hx   . Rectal cancer Neg Hx   . Stomach cancer Neg Hx     ROS: Review of Systems  Constitutional: Negative for weight loss.  Gastrointestinal: Negative for nausea and vomiting.  Musculoskeletal:       Negative muscle weakness    PHYSICAL EXAM: Blood pressure 120/74, pulse 70, temperature 98.3 F (36.8 C), height 5\' 3"  (1.6 m), weight 183 lb (83 kg), SpO2 96 %. Body mass index is 32.42 kg/m. Physical Exam  Constitutional: She is oriented to person, place, and time. She appears well-developed and well-nourished.  Cardiovascular: Normal rate.   Pulmonary/Chest: Effort normal.  Musculoskeletal: Normal range of motion.  Neurological: She is oriented to person, place, and time.  Skin: Skin is warm and dry.  Psychiatric: She has a normal mood and affect. Her behavior is normal.  Vitals reviewed.   RECENT LABS AND TESTS: BMET    Component Value Date/Time   NA 139 07/30/2016 1439   NA 139 07/25/2016 0917   K 3.7 07/30/2016 1439   CL 105 07/30/2016 1439   CO2 24 07/30/2016 1439   GLUCOSE 58 (L) 07/30/2016 1439   BUN 16 07/30/2016 1439   BUN 15 07/25/2016 0917   CREATININE 0.65 07/30/2016 1439   CALCIUM 9.4 07/30/2016 1439   GFRNONAA 85 07/25/2016 0917   GFRAA 98 07/25/2016 0917   Lab Results  Component Value Date   HGBA1C 6.0 (H) 07/25/2016   HGBA1C 5.8 (H) 04/17/2016   Lab Results  Component Value Date   INSULIN 19.7 07/25/2016   INSULIN 13.9 04/17/2016   CBC    Component Value Date/Time   WBC 9.4 07/30/2016 1439   RBC 4.40 07/30/2016 1439   HGB 12.2 07/30/2016 1439   HGB 12.7 04/17/2016 1150   HCT 37.2 07/30/2016 1439   HCT 39.5 04/17/2016 1150   PLT 283.0 07/30/2016 1439   MCV 84.4 07/30/2016 1439   MCV 84 04/17/2016 1150   MCH 27.1 04/17/2016 1150   MCH 29.3 11/18/2013 0705   MCHC 32.9 07/30/2016 1439   RDW 15.9 (H) 07/30/2016 1439   RDW 16.0 (H) 04/17/2016 1150   LYMPHSABS 3.5 07/30/2016 1439   LYMPHSABS 2.7 04/17/2016 1150   MONOABS 0.7  07/30/2016 1439   EOSABS 0.4 07/30/2016 1439   EOSABS 0.2 04/17/2016 1150   BASOSABS 0.1 07/30/2016 1439   BASOSABS 0.0 04/17/2016 1150   Iron/TIBC/Ferritin/ %Sat No results found for: IRON, TIBC, FERRITIN, IRONPCTSAT Lipid Panel     Component Value Date/Time   CHOL 183 07/25/2016 0917   TRIG 158 (H) 07/25/2016 8182  HDL 57 07/25/2016 0917   CHOLHDL 3 10/24/2015 1054   VLDL 21.6 10/24/2015 1054   LDLCALC 94 07/25/2016 0917   LDLDIRECT 149.9 09/11/2010 0908   Hepatic Function Panel     Component Value Date/Time   PROT 6.9 07/25/2016 0917   ALBUMIN 4.1 07/25/2016 0917   AST 23 07/25/2016 0917   ALT 24 07/25/2016 0917   ALKPHOS 78 07/25/2016 0917   BILITOT 0.4 07/25/2016 0917   BILIDIR 0.0 10/24/2015 1054      Component Value Date/Time   TSH 1.890 04/17/2016 1150   TSH 1.15 10/24/2015 1054   TSH 1.30 04/25/2015 1132    ASSESSMENT AND PLAN: Vitamin D deficiency - Plan: Vitamin D, Ergocalciferol, (DRISDOL) 50000 units CAPS capsule  Prediabetes  Class 1 obesity with serious comorbidity and body mass index (BMI) of 32.0 to 32.9 in adult, unspecified obesity type  PLAN:  Vitamin D Deficiency Brandy Dunlap was informed that low vitamin D levels contributes to fatigue and are associated with obesity, breast, and colon cancer. She agrees to continue to take prescription Vit D @50 ,000 IU every week, we will refill for 1 month and will follow up for routine testing of vitamin D, at least 2-3 times per year. She was informed of the risk of over-replacement of vitamin D and agrees to not increase her dose unless he discusses this with [name] first. Brandy Dunlap agrees to follow up with our clinic in 3 weeks.  Pre-Diabetes Brandy Dunlap will continue to work on weight loss, exercise, and decreasing simple carbohydrates in her diet to help decrease the risk of diabetes. We dicussed metformin including benefits and risks. She was informed that eating too many simple carbohydrates or too many calories at one  sitting increases the likelihood of GI side effects. Brandy Dunlap agrees to continue metformin for now and a prescription was not written today. Brandy Dunlap agreed to follow up with [name] as directed to monitor her progress.  Obesity Brandy Dunlap is currently in the action stage of change. As such, her goal is to continue with weight loss efforts She has agreed to keep a food journal with 500 calories and 40 grams of protein at supper daily and follow the Category 2 plan Brandy Dunlap has been instructed to work up to a goal of 150 minutes of combined cardio and strengthening exercise per week for weight loss and overall health benefits. We discussed the following Behavioral Modification Strategies today: increasing lean protein intake and no skipping meals  Brandy Dunlap has agreed to follow up with our clinic in 3 weeks. She was informed of the importance of frequent follow up visits to maximize her success with intensive lifestyle modifications for her multiple health conditions.  I, Doreene Nest, am acting as transcriptionist for Lacy Duverney, PA-C  I have reviewed the above documentation for accuracy and completeness, and I agree with the above. -Lacy Duverney, PA-C  I have reviewed the above note and agree with the plan. -Dennard Nip, MD   OBESITY BEHAVIORAL INTERVENTION VISIT  Today's visit was # 13 out of 22.  Starting weight: 195 lbs Starting date: 04/17/16 Today's weight : 183 lbs Today's date: 11/12/2016 Total lbs lost to date: 12 (Patients must lose 7 lbs in the first 6 months to continue with counseling)   ASK: We discussed the diagnosis of obesity with Brandy Dunlap today and Brandy Dunlap agreed to give [name] permission to discuss obesity behavioral modification therapy today.  ASSESS: Brandy Dunlap has the diagnosis of obesity and her BMI today is 32.42 Brandy Dunlap is  in the action stage of change   ADVISE: Brandy Dunlap was educated on the multiple health risks of obesity as well as the benefit of weight loss to improve her health. She was  advised of the need for long term treatment and the importance of lifestyle modifications.  AGREE: Multiple dietary modification options and treatment options were discussed and  Brandy Dunlap agreed to keep a food journal with 500 calories and 40 grams of protein at supper daily and follow the Category 2 plan We discussed the following Behavioral Modification Strategies today: increasing lean protein intake and no skipping meals

## 2016-11-12 NOTE — Telephone Encounter (Signed)
Form is requiring PPD. I don't see that we have done PPD on her.  Please schedule nurse visit for PPD placement.

## 2016-11-15 ENCOUNTER — Ambulatory Visit
Admission: RE | Admit: 2016-11-15 | Discharge: 2016-11-15 | Disposition: A | Discharge status: Home / Self Care | Payer: Medicare Other | Source: Ambulatory Visit | Attending: Family | Admitting: Family

## 2016-11-15 ENCOUNTER — Other Ambulatory Visit: Payer: Self-pay | Admitting: Family

## 2016-11-15 DIAGNOSIS — N644 Mastodynia: Secondary | ICD-10-CM

## 2016-11-15 DIAGNOSIS — Z1231 Encounter for screening mammogram for malignant neoplasm of breast: Secondary | ICD-10-CM

## 2016-11-16 ENCOUNTER — Ambulatory Visit (INDEPENDENT_AMBULATORY_CARE_PROVIDER_SITE_OTHER): Payer: Medicare Other

## 2016-11-16 DIAGNOSIS — Z111 Encounter for screening for respiratory tuberculosis: Secondary | ICD-10-CM

## 2016-11-16 NOTE — Progress Notes (Signed)
Pre visit review using our clinic tool,if applicable. No additional management support is needed unless otherwise documented below in the visit note.   Patient in for PPD administration. Given 0.1 ml Left forearm. Patient tolerated well and instructed on how to care for site. Patient advised to return at same time on Monday. Patient will schedule appointment on her way out.   Brandy Dunlap has documents needed by patient to be signed.

## 2016-11-19 ENCOUNTER — Ambulatory Visit: Payer: Medicare Other | Admitting: Behavioral Health

## 2016-11-19 DIAGNOSIS — Z111 Encounter for screening for respiratory tuberculosis: Secondary | ICD-10-CM

## 2016-11-19 LAB — TB SKIN TEST
Induration: 0 mm
TB Skin Test: NEGATIVE

## 2016-11-19 NOTE — Progress Notes (Signed)
Pre visit review using our clinic review tool, if applicable. No additional management support is needed unless otherwise documented below in the visit note.  Patient came in clinic to have PPD read. Provider's order was given on 11/02/16; see telephone note. PPD was placed on 11/16/16. Today's results were negative; 0 mm induration. No chest x-ray was required. [facility] form was completed & given to the patient prior to leaving the nurse visit.

## 2016-11-21 ENCOUNTER — Ambulatory Visit (INDEPENDENT_AMBULATORY_CARE_PROVIDER_SITE_OTHER): Payer: Medicare Other | Admitting: Family

## 2016-11-21 ENCOUNTER — Encounter: Payer: Self-pay | Admitting: Family

## 2016-11-21 VITALS — BP 127/58 | HR 68 | Temp 99.0°F | Resp 16 | Ht 63.0 in | Wt 187.0 lb

## 2016-11-21 DIAGNOSIS — N644 Mastodynia: Secondary | ICD-10-CM | POA: Diagnosis not present

## 2016-11-21 MED ORDER — MELOXICAM 7.5 MG PO TABS
7.5000 mg | ORAL_TABLET | Freq: Every day | ORAL | 0 refills | Status: DC
Start: 2016-11-21 — End: 2016-12-03

## 2016-11-21 NOTE — Patient Instructions (Addendum)
Begin meloxicam once daily for next 1-2 weeks for chest wall/breast pain. You will be contacted about scheduling your mammogram at the breast center.  Let me know if you have not been contacted in 1 week. Their number is [phone]

## 2016-11-21 NOTE — Progress Notes (Signed)
Subjective:    Patient ID: Brandy Dunlap, female    DOB: Jul 18, 1950, 66 y.o.   MRN: [id number]  HPI   Brandy Dunlap is a 66 yr old female who presents today with chief complaint of mastalgia.  Reports pain x 2 weeks in the left breast.  Throbbing pain and tenderness.     Review of Systems    see HPI  Past Medical History:  Diagnosis Date  . Allergy   . Asthma   . B12 deficiency   . Back pain   . Bronchitis    hx  . Colon polyp 10/06/2003  . GERD (gastroesophageal reflux disease)   . Hypertension   . Low back pain   . Osteoarthritis   . Sinus trouble      Social History   Social History  . Marital status: Single    Spouse name: N/A  . Number of children: 1  . Years of education: N/A   Occupational History  . teacher [employer]   Social History Main Topics  . Smoking status: Never Smoker  . Smokeless tobacco: Never Used  . Alcohol use No  . Drug use: No  . Sexual activity: Yes    Partners: Male    Birth control/ protection: Post-menopausal   Other Topics Concern  . Not on file   Social History Narrative   Divorced   One daughter- lives locally and one grandson   Retired Pharmacist, hospital,  Has masters degree   Enjoys reading, spending time with her grandson    Past Surgical History:  Procedure Laterality Date  . CHOLECYSTECTOMY    . COLONOSCOPY    . COLONOSCOPY W/ BIOPSIES  10/06/2003  . LUMBAR FUSION  2008  . PARTIAL HIP ARTHROPLASTY Right 2009    hip replacement  . TONSILLECTOMY    . TOTAL KNEE ARTHROPLASTY Right 11/16/2013   Procedure: RIGHT TOTAL KNEE ARTHROPLASTY;  Surgeon: Vickey Huger, MD;  Location: [facility];  Service: Orthopedics;  Laterality: Right;  [name] VIDEO BRONCHOSCOPY Bilateral 12/05/2012   Procedure: VIDEO BRONCHOSCOPY WITHOUT FLUORO;  Surgeon: Collene Gobble, MD;  Location: [facility];  Service: Cardiopulmonary;  Laterality: Bilateral;    Family History  Problem Relation Age of Onset  . COPD Father        died at age 64  .  Emphysema Father   . Heart Problems Father   . Heart disease Brother        stent with 90%  . COPD Mother   . Heart disease Mother   . Ovarian cancer Mother   . Emphysema Mother   . Hypertension Mother   . Hyperlipidemia Mother   . Heart Problems Mother   . Stroke Mother   . Thyroid disease Mother   . Cancer Mother   . Colon cancer Neg Hx   . Esophageal cancer Neg Hx   . Rectal cancer Neg Hx   . Stomach cancer Neg Hx     Allergies  Allergen Reactions  . Pantoprazole Sodium Other (See Comments)    Racing heart    Current Outpatient Prescriptions on File Prior to Visit  Medication Sig Dispense Refill  . amLODipine (NORVASC) 5 MG tablet Take 1 tablet (5 mg total) by mouth daily. 90 tablet 1  . budesonide-formoterol (SYMBICORT) 160-4.5 MCG/ACT inhaler Inhale 2 puffs into the lungs 2 (two) times daily.    [name] DEXILANT 60 MG capsule TAKE 1 CAPSULE BY MOUTH EVERY DAY 90 capsule 1  . diclofenac  sodium (VOLTAREN) 1 % GEL APPLY (2G) BY TOPICAL ROUTE 4 TIMES EVERY DAY TO THE AFFECTED AREA(S)  1  . EPIPEN 2-PAK 0.3 MG/0.3ML SOAJ injection   1  . metFORMIN (GLUCOPHAGE) 500 MG tablet Take 1 tablet (500 mg total) by mouth daily with breakfast. 30 tablet 0  . montelukast (SINGULAIR) 10 MG tablet Take 10 mg by mouth every evening.  5  . Multiple Vitamin (MULTIVITAMIN) capsule Take 1 capsule by mouth daily.      [name] omalizumab (XOLAIR) 150 MG injection Inject 150 mg into the skin every 14 (fourteen) days.    [name] PROAIR RESPICLICK 575 (90 BASE) MCG/ACT AEPB Inhale 2 puffs into the lungs every 6 (six) hours as needed.  0  . Vitamin D, Ergocalciferol, (DRISDOL) 50000 units CAPS capsule Take 1 capsule (50,000 Units total) by mouth every 7 (seven) days. 4 capsule 0   No current facility-administered medications on file prior to visit.     BP (!) 127/58 (BP Location: Right Arm, Cuff Size: Large)   Pulse 68   Temp 99 F (37.2 C) (Oral)   Resp 16   Ht 5\' 3"  (1.6 m)   Wt 187 lb (84.8 kg)   SpO2 98%    BMI 33.13 kg/m    Objective:   Physical Exam  Constitutional: She appears well-developed and well-nourished. No distress.  Psychiatric: She has a normal mood and affect. Her behavior is normal. Judgment and thought content normal.  Breast:  + tenderness at 10 oclock and beneath left breast, no masses or retractions noted bilaterally      Assessment & Plan:  Mastalgia- will refer for diagnostic mammo and [name].  Trial of meloxicam for probable musculoskeletal pain.

## 2016-12-03 ENCOUNTER — Ambulatory Visit (INDEPENDENT_AMBULATORY_CARE_PROVIDER_SITE_OTHER): Payer: Medicare Other | Admitting: Physician Assistant

## 2016-12-03 VITALS — BP 121/73 | HR 63 | Temp 98.5°F | Ht 63.0 in | Wt 183.0 lb

## 2016-12-03 DIAGNOSIS — E781 Pure hyperglyceridemia: Secondary | ICD-10-CM | POA: Diagnosis not present

## 2016-12-03 DIAGNOSIS — Z6832 Body mass index (BMI) 32.0-32.9, adult: Secondary | ICD-10-CM | POA: Diagnosis not present

## 2016-12-03 DIAGNOSIS — E559 Vitamin D deficiency, unspecified: Secondary | ICD-10-CM | POA: Diagnosis not present

## 2016-12-03 DIAGNOSIS — I10 Essential (primary) hypertension: Secondary | ICD-10-CM | POA: Diagnosis not present

## 2016-12-03 DIAGNOSIS — R7303 Prediabetes: Secondary | ICD-10-CM

## 2016-12-03 DIAGNOSIS — E669 Obesity, unspecified: Secondary | ICD-10-CM

## 2016-12-03 DIAGNOSIS — E66811 Obesity, class 1: Secondary | ICD-10-CM

## 2016-12-03 MED ORDER — METFORMIN HCL 500 MG PO TABS: 500.0000 mg | ORAL_TABLET | Freq: Every day | ORAL | 0 refills | Status: DC

## 2016-12-04 LAB — COMPREHENSIVE METABOLIC PANEL
ALT: 20 IU/L (ref 0–32)
AST: 19 IU/L (ref 0–40)
Albumin/Globulin Ratio: 1.6 (ref 1.2–2.2)
Albumin: 4.1 g/dL (ref 3.6–4.8)
Alkaline Phosphatase: 82 IU/L (ref 39–117)
BUN/Creatinine Ratio: 19 (ref 12–28)
BUN: 15 mg/dL (ref 8–27)
Bilirubin Total: 0.4 mg/dL (ref 0.0–1.2)
CO2: 23 mmol/L (ref 20–29)
Calcium: 9.3 mg/dL (ref 8.7–10.3)
Chloride: 103 mmol/L (ref 96–106)
Creatinine, Ser: 0.8 mg/dL (ref 0.57–1.00)
GFR calc Af Amer: 89 mL/min/{1.73_m2} (ref >59)
GFR calc non Af Amer: 77 mL/min/{1.73_m2} (ref >59)
Globulin, Total: 2.6 g/dL (ref 1.5–4.5)
Glucose: 99 mg/dL (ref 65–99)
Potassium: 4.4 mmol/L (ref 3.5–5.2)
Sodium: 141 mmol/L (ref 134–144)
Total Protein: 6.7 g/dL (ref 6.0–8.5)

## 2016-12-04 LAB — HEMOGLOBIN A1C
Est. average glucose Bld gHb Est-mCnc: 117 mg/dL
Hgb A1c MFr Bld: 5.7 % — ABNORMAL HIGH (ref 4.8–5.6)

## 2016-12-04 LAB — LIPID PANEL WITH LDL/HDL RATIO
Cholesterol, Total: 199 mg/dL (ref 100–199)
HDL: 84 mg/dL (ref >39)
LDL Calculated: 98 mg/dL (ref 0–99)
LDl/HDL Ratio: 1.2 ratio (ref 0.0–3.2)
Triglycerides: 87 mg/dL (ref 0–149)
VLDL Cholesterol Cal: 17 mg/dL (ref 5–40)

## 2016-12-04 LAB — INSULIN, RANDOM: INSULIN: 13.4 u[IU]/mL (ref 2.6–24.9)

## 2016-12-04 LAB — VITAMIN D 25 HYDROXY (VIT D DEFICIENCY, FRACTURES): Vit D, 25-Hydroxy: 32.1 ng/mL (ref 30.0–100.0)

## 2016-12-04 NOTE — Progress Notes (Signed)
Office: [phone]  /  Fax: [phone]   HPI:   Chief Complaint: OBESITY Brandy Dunlap is here to discuss her progress with her obesity treatment plan. She is on the Category 2 plan and is following her eating plan approximately 90 % of the time. She states she is exercising 45 minutes 7 times per week. Brandy Dunlap maintained her weight and states she is very mindful of her eating. She makes smart food choices and controls her portions. States she exceeds her assigned daily calories on many occasions. Her weight is 183 lb (83 kg) today and has maintained weight over a period of 3 weeks since her last visit. She has lost 12 lbs since starting treatment with [name].  Vitamin D deficiency Brandy Dunlap has a diagnosis of vitamin D deficiency. Her vitamin D level is at goal and she is currently taking vit D 50,000 IU every other week and denies nausea, vomiting or muscle weakness.  Pre-Diabetes Brandy Dunlap has a diagnosis of pre-diabetes based on her elevated Hgb A1c and was informed this puts her at greater risk of developing diabetes. She is taking metformin currently and continues to work on diet and exercise to decrease risk of diabetes. She denies nausea, polyphagia or hypoglycemia.  Hypertriglyceridemia Brandy Dunlap has hypertriglyceridemia and is not currently on medications and she declines medications today. She has been trying to improve her cholesterol levels with intensive lifestyle modification including a low saturated fat diet, exercise and weight loss. She denies any chest pain, claudication or myalgias.  Hypertension Brandy Dunlap is a 66 Brandy Dunlap.o. female with hypertension. Brandy Dunlap denies chest pain or shortness of breath on exertion. She is working weight loss to help control her blood Dunlap with the goal of decreasing her risk of heart attack and stroke. Brandy Dunlap is currently stable.   ALLERGIES: Allergies  Allergen Reactions  . Pantoprazole Sodium Other (See Comments)    Racing heart     MEDICATIONS: Current Outpatient Prescriptions on File Prior to Visit  Medication Sig Dispense Refill  . amLODipine (NORVASC) 5 MG tablet Take 1 tablet (5 mg total) by mouth daily. 90 tablet 1  . budesonide-formoterol (SYMBICORT) 160-4.5 MCG/ACT inhaler Inhale 2 puffs into the lungs 2 (two) times daily.    [name] DEXILANT 60 MG capsule TAKE 1 CAPSULE BY MOUTH EVERY DAY 90 capsule 1  . diclofenac sodium (VOLTAREN) 1 % GEL APPLY (2G) BY TOPICAL ROUTE 4 TIMES EVERY DAY TO THE AFFECTED AREA(S)  1  . EPIPEN 2-PAK 0.3 MG/0.3ML SOAJ injection   1  . montelukast (SINGULAIR) 10 MG tablet Take 10 mg by mouth every evening.  5  . Multiple Vitamin (MULTIVITAMIN) capsule Take 1 capsule by mouth daily.      [name] omalizumab (XOLAIR) 150 MG injection Inject 150 mg into the skin every 14 (fourteen) days.    [name] PROAIR RESPICLICK 263 (90 BASE) MCG/ACT AEPB Inhale 2 puffs into the lungs every 6 (six) hours as needed.  0  . Vitamin D, Ergocalciferol, (DRISDOL) 50000 units CAPS capsule Take 1 capsule (50,000 Units total) by mouth every 7 (seven) days. 4 capsule 0   No current facility-administered medications on file prior to visit.     PAST MEDICAL HISTORY: Past Medical History:  Diagnosis Date  . Allergy   . Asthma   . B12 deficiency   . Back pain   . Bronchitis    hx  . Dunlap polyp 10/06/2003  . GERD (gastroesophageal reflux disease)   . Hypertension   .  Low back pain   . Osteoarthritis   . Sinus trouble     PAST SURGICAL HISTORY: Past Surgical History:  Procedure Laterality Date  . CHOLECYSTECTOMY    . COLONOSCOPY    . COLONOSCOPY W/ BIOPSIES  10/06/2003  . LUMBAR FUSION  2008  . PARTIAL HIP ARTHROPLASTY Right 2009    hip replacement  . TONSILLECTOMY    . TOTAL KNEE ARTHROPLASTY Right 11/16/2013   Procedure: RIGHT TOTAL KNEE ARTHROPLASTY;  Surgeon: Vickey Huger, MD;  Location: [facility];  Service: Orthopedics;  Laterality: Right;  [name] VIDEO BRONCHOSCOPY Bilateral 12/05/2012   Procedure: VIDEO  BRONCHOSCOPY WITHOUT FLUORO;  Surgeon: Collene Gobble, MD;  Location: [facility];  Service: Cardiopulmonary;  Laterality: Bilateral;    SOCIAL HISTORY: Social History  Substance Use Topics  . Smoking status: Never Smoker  . Smokeless tobacco: Never Used  . Alcohol use No    FAMILY HISTORY: Family History  Problem Relation Age of Onset  . COPD Father        died at age 7  . Emphysema Father   . Heart Problems Father   . Heart disease Brother        stent with 90%  . COPD Mother   . Heart disease Mother   . Ovarian cancer Mother   . Emphysema Mother   . Hypertension Mother   . Hyperlipidemia Mother   . Heart Problems Mother   . Stroke Mother   . Thyroid disease Mother   . Cancer Mother   . Dunlap cancer Neg Hx   . Esophageal cancer Neg Hx   . Rectal cancer Neg Hx   . Stomach cancer Neg Hx     ROS: Review of Systems  Constitutional: Negative for weight loss.  Respiratory: Negative for shortness of breath (on exertion).   Cardiovascular: Negative for chest pain and claudication.  Gastrointestinal: Negative for nausea and vomiting.  Musculoskeletal: Negative for myalgias.       Negative muscle weakness  Endo/Heme/Allergies:       Negative polyphagia Negative hypoglycemia    PHYSICAL EXAM: Blood Dunlap 121/73, pulse 63, temperature 98.5 F (36.9 C), temperature source Oral, height 5\' 3"  (1.6 m), weight 183 lb (83 kg), SpO2 97 %. Body mass index is 32.42 kg/m. Physical Exam  Constitutional: She is oriented to person, place, and time. She appears well-developed and well-nourished.  Cardiovascular: Normal rate.   Pulmonary/Chest: Effort normal.  Musculoskeletal: Normal range of motion.  Neurological: She is oriented to person, place, and time.  Skin: Skin is warm and dry.  Psychiatric: She has a normal mood and affect. Her behavior is normal.  Vitals reviewed.   RECENT LABS AND TESTS: BMET    Component Value Date/Time   NA 141 12/03/2016 0925   K 4.4  12/03/2016 0925   CL 103 12/03/2016 0925   CO2 23 12/03/2016 0925   GLUCOSE 99 12/03/2016 0925   GLUCOSE 58 (L) 07/30/2016 1439   BUN 15 12/03/2016 0925   CREATININE 0.80 12/03/2016 0925   CALCIUM 9.3 12/03/2016 0925   GFRNONAA 77 12/03/2016 0925   GFRAA 89 12/03/2016 0925   Lab Results  Component Value Date   HGBA1C 5.7 (H) 12/03/2016   HGBA1C 6.0 (H) 07/25/2016   HGBA1C 5.8 (H) 04/17/2016   Lab Results  Component Value Date   INSULIN 13.4 12/03/2016   INSULIN 19.7 07/25/2016   INSULIN 13.9 04/17/2016   CBC    Component Value Date/Time   WBC 9.4 07/30/2016 1439  RBC 4.40 07/30/2016 1439   HGB 12.2 07/30/2016 1439   HGB 12.7 04/17/2016 1150   HCT 37.2 07/30/2016 1439   HCT 39.5 04/17/2016 1150   PLT 283.0 07/30/2016 1439   MCV 84.4 07/30/2016 1439   MCV 84 04/17/2016 1150   MCH 27.1 04/17/2016 1150   MCH 29.3 11/18/2013 0705   MCHC 32.9 07/30/2016 1439   RDW 15.9 (H) 07/30/2016 1439   RDW 16.0 (H) 04/17/2016 1150   LYMPHSABS 3.5 07/30/2016 1439   LYMPHSABS 2.7 04/17/2016 1150   MONOABS 0.7 07/30/2016 1439   EOSABS 0.4 07/30/2016 1439   EOSABS 0.2 04/17/2016 1150   BASOSABS 0.1 07/30/2016 1439   BASOSABS 0.0 04/17/2016 1150   Iron/TIBC/Ferritin/ %Sat No results found for: IRON, TIBC, FERRITIN, IRONPCTSAT Lipid Panel     Component Value Date/Time   CHOL 199 12/03/2016 0925   TRIG 87 12/03/2016 0925   HDL 84 12/03/2016 0925   CHOLHDL 3 10/24/2015 1054   VLDL 21.6 10/24/2015 1054   LDLCALC 98 12/03/2016 0925   LDLDIRECT 149.9 09/11/2010 0908   Hepatic Function Panel     Component Value Date/Time   PROT 6.7 12/03/2016 0925   ALBUMIN 4.1 12/03/2016 0925   AST 19 12/03/2016 0925   ALT 20 12/03/2016 0925   ALKPHOS 82 12/03/2016 0925   BILITOT 0.4 12/03/2016 0925   BILIDIR 0.0 10/24/2015 1054      Component Value Date/Time   TSH 1.890 04/17/2016 1150   TSH 1.15 10/24/2015 1054   TSH 1.30 04/25/2015 1132    ASSESSMENT AND PLAN: Essential  hypertension - Plan: Comprehensive metabolic panel  Vitamin D deficiency - Plan: VITAMIN D 25 Hydroxy (Vit-D Deficiency, Fractures)  Prediabetes - Plan: Comprehensive metabolic panel, Hemoglobin A1c, Insulin, random, metFORMIN (GLUCOPHAGE) 500 MG tablet  Hypertriglyceridemia - Plan: Lipid Panel With LDL/HDL Ratio  Class 1 obesity with serious comorbidity and body mass index (BMI) of 32.0 to 32.9 in adult, unspecified obesity type  PLAN:  Vitamin D Deficiency Brandy Dunlap was informed that low vitamin D levels contributes to fatigue and are associated with obesity, breast, and Dunlap cancer. She agrees to continue to take prescription Vit D @50 ,000 IU every other week. We will check labs and will follow up for routine testing of vitamin D, at least 2-3 times per year. She was informed of the risk of over-replacement of vitamin D and agrees to not increase her dose unless he discusses this with [name] first. Brandy Dunlap agrees to follow up with our clinic in 2 weeks.  Pre-Diabetes Brandy Dunlap will continue to work on weight loss, exercise, and decreasing simple carbohydrates in her diet to help decrease the risk of diabetes. We dicussed metformin including benefits and risks. She was informed that eating too many simple carbohydrates or too many calories at one sitting increases the likelihood of GI side effects. Brandy Dunlap requested metformin for now and a prescription was written today for 1 month refill. Brandy Dunlap agreed to follow up with [name] as directed to monitor her progress.  Hypertriglyceridemia Brandy Dunlap was informed of the American Heart Association Guidelines emphasizing intensive lifestyle modifications as the first line treatment for hypertriglyceridemia. We discussed many lifestyle modifications today in depth, and Brandy Dunlap will continue to work on decreasing saturated fats such as fatty red meat, butter and many fried foods. She will also increase vegetables and lean protein in her diet and continue to work on exercise and weight  loss efforts. We will check labs and Brandy Dunlap will follow up at the agreed upon  time.  Hypertension We discussed sodium restriction, working on healthy weight loss, and a regular exercise program as the means to achieve improved blood Dunlap control. Brandy Dunlap agreed with this plan and agreed to follow up as directed. We will continue to monitor her blood Dunlap as well as her progress with the above lifestyle modifications. We will check labs and she will continue her medications as prescribed and will watch for signs of hypotension as she continues her lifestyle modifications.  Obesity Brandy Dunlap is currently in the action stage of change. As such, her goal is to continue with weight loss efforts She has agreed to follow a lower carbohydrate, vegetable and lean protein rich diet plan Brandy Dunlap has been instructed to work up to a goal of 150 minutes of combined cardio and strengthening exercise per week for weight loss and overall health benefits. We discussed the following Behavioral Modification Strategies today: increasing lean protein intake and keeping healthy foods in the home  Brandy Dunlap has agreed to follow up with our clinic in 2 weeks. She was informed of the importance of frequent follow up visits to maximize her success with intensive lifestyle modifications for her multiple health conditions.  I, Brandy Dunlap, am acting as transcriptionist for Brandy Duverney, PA-C  I have reviewed the above documentation for accuracy and completeness, and I agree with the above. -Brandy Duverney, PA-C  I have reviewed the above note and agree with the plan. -Brandy Nip, MD  OBESITY BEHAVIORAL INTERVENTION VISIT  Today's visit was # 14 out of 22.  Starting weight: 195 lbs Starting date: 04/17/16 Today's weight : 183 lbs  Today's date: 12/03/2016 Total lbs lost to date: 12 (Patients must lose 7 lbs in the first 6 months to continue with counseling)   ASK: We discussed the diagnosis of obesity with Brandy Dunlap  today and Brandy Dunlap agreed to give [name] permission to discuss obesity behavioral modification therapy today.  ASSESS: Brandy Dunlap has the diagnosis of obesity and her BMI today is 32.42 [place] is in the action stage of change   ADVISE: Brandy Dunlap was educated on the multiple health risks of obesity as well as the benefit of weight loss to improve her health. She was advised of the need for long term treatment and the importance of lifestyle modifications.  AGREE: Multiple dietary modification options and treatment options were discussed and  [place] agreed to follow a lower carbohydrate, vegetable and lean protein rich diet plan We discussed the following Behavioral Modification Strategies today: increasing lean protein intake and keeping healthy foods in the home

## 2016-12-11 ENCOUNTER — Ambulatory Visit: Admission: RE | Admit: 2016-12-11 | Payer: Medicare Other | Source: Ambulatory Visit

## 2016-12-11 ENCOUNTER — Encounter: Payer: Self-pay | Admitting: Family

## 2016-12-11 ENCOUNTER — Ambulatory Visit
Admission: RE | Admit: 2016-12-11 | Discharge: 2016-12-11 | Disposition: A | Discharge status: Home / Self Care | Payer: Medicare Other | Source: Ambulatory Visit | Attending: Family | Admitting: Family

## 2016-12-11 DIAGNOSIS — N644 Mastodynia: Secondary | ICD-10-CM

## 2016-12-12 MED ORDER — MELOXICAM 7.5 MG PO TABS: 7.5000 mg | ORAL_TABLET | Freq: Every day | ORAL | 0 refills | Status: DC

## 2016-12-18 ENCOUNTER — Ambulatory Visit (INDEPENDENT_AMBULATORY_CARE_PROVIDER_SITE_OTHER): Payer: Medicare Other | Admitting: Physician Assistant

## 2016-12-24 ENCOUNTER — Ambulatory Visit (INDEPENDENT_AMBULATORY_CARE_PROVIDER_SITE_OTHER): Payer: Medicare Other | Admitting: Physician Assistant

## 2016-12-25 ENCOUNTER — Ambulatory Visit (INDEPENDENT_AMBULATORY_CARE_PROVIDER_SITE_OTHER): Payer: Medicare Other | Admitting: Physician Assistant

## 2016-12-25 VITALS — BP 136/78 | HR 67 | Temp 98.5°F | Ht 63.0 in | Wt 186.0 lb

## 2016-12-25 DIAGNOSIS — F3289 Other specified depressive episodes: Secondary | ICD-10-CM

## 2016-12-25 DIAGNOSIS — E559 Vitamin D deficiency, unspecified: Secondary | ICD-10-CM

## 2016-12-25 DIAGNOSIS — Z6832 Body mass index (BMI) 32.0-32.9, adult: Secondary | ICD-10-CM

## 2016-12-25 DIAGNOSIS — E669 Obesity, unspecified: Secondary | ICD-10-CM

## 2016-12-25 MED ORDER — VITAMIN D3 1.25 MG (50000 UT) PO CAPS: 1.0000 | ORAL_CAPSULE | ORAL | 0 refills | Status: DC

## 2016-12-25 MED ORDER — NALTREXONE-BUPROPION HCL ER 8-90 MG PO TB12: 2.0000 | ORAL_TABLET | Freq: Two times a day (BID) | ORAL | 0 refills | Status: DC

## 2016-12-25 NOTE — Progress Notes (Signed)
Office: [phone]  /  Fax: [phone]   HPI:   Chief Complaint: OBESITY Olean is here to discuss her progress with her obesity treatment plan. She is on the lower carbohydrate, vegetable and lean protein rich diet plan and is following her eating plan approximately 50 % of the time. She states she is exercising low impact aerobics and yoga for 45 minutes 5 times per week. Lilliam has had increased emotional eating for the past few weeks. She states she tolerated Contrave in the past and would like to get back on it for increased cravings. Also, she would like more variety at her meals.  Her weight is 186 lb (84.4 kg) today and has had a weight gain of 3 pounds over a period of 3 weeks since her last visit. She has lost 9 lbs since starting treatment with [name].  Vitamin D deficiency Mariadelosang has a diagnosis of vitamin D deficiency. She is not currently taking vit D and denies nausea, vomiting or muscle weakness.  Depression with emotional eating behaviors Josphine is struggling with emotional eating and using food for comfort to the extent that it is negatively impacting her health. She often snacks when she is not hungry. Christyanna sometimes feels she is out of control and then feels guilty that she made poor food choices. She has been working on behavior modification techniques to help reduce her emotional eating and has been somewhat successful. Her mood is stable and she shows no sign of suicidal or homicidal ideations.  Depression screen [facility] 2/9 04/17/2016 10/24/2015 10/25/2014  Decreased Interest 1 0 0  Down, Depressed, Hopeless 0 0 0  PHQ - 2 Score 1 0 0  Altered sleeping 1 - -  Tired, decreased energy 2 - -  Change in appetite 1 - -  Feeling bad or failure about yourself  1 - -  Trouble concentrating 1 - -  Moving slowly or fidgety/restless 0 - -  Suicidal thoughts 0 - -  PHQ-9 Score 7 - -       ALLERGIES: Allergies  Allergen Reactions  . Pantoprazole Sodium Other (See Comments)   Racing heart    MEDICATIONS: Current Outpatient Prescriptions on File Prior to Visit  Medication Sig Dispense Refill  . amLODipine (NORVASC) 5 MG tablet Take 1 tablet (5 mg total) by mouth daily. 90 tablet 1  . budesonide-formoterol (SYMBICORT) 160-4.5 MCG/ACT inhaler Inhale 2 puffs into the lungs 2 (two) times daily.    [name] DEXILANT 60 MG capsule TAKE 1 CAPSULE BY MOUTH EVERY DAY 90 capsule 1  . diclofenac sodium (VOLTAREN) 1 % GEL APPLY (2G) BY TOPICAL ROUTE 4 TIMES EVERY DAY TO THE AFFECTED AREA(S)  1  . EPIPEN 2-PAK 0.3 MG/0.3ML SOAJ injection   1  . metFORMIN (GLUCOPHAGE) 500 MG tablet Take 1 tablet (500 mg total) by mouth daily with breakfast. 30 tablet 0  . montelukast (SINGULAIR) 10 MG tablet Take 10 mg by mouth every evening.  5  . Multiple Vitamin (MULTIVITAMIN) capsule Take 1 capsule by mouth daily.      [name] omalizumab (XOLAIR) 150 MG injection Inject 150 mg into the skin every 14 (fourteen) days.    [name] PROAIR RESPICLICK 784 (90 BASE) MCG/ACT AEPB Inhale 2 puffs into the lungs every 6 (six) hours as needed.  0   No current facility-administered medications on file prior to visit.     PAST MEDICAL HISTORY: Past Medical History:  Diagnosis Date  . Allergy   . Asthma   .  B12 deficiency   . Back pain   . Bronchitis    hx  . Colon polyp 10/06/2003  . GERD (gastroesophageal reflux disease)   . Hypertension   . Low back pain   . Osteoarthritis   . Sinus trouble     PAST SURGICAL HISTORY: Past Surgical History:  Procedure Laterality Date  . CHOLECYSTECTOMY    . COLONOSCOPY    . COLONOSCOPY W/ BIOPSIES  10/06/2003  . LUMBAR FUSION  2008  . PARTIAL HIP ARTHROPLASTY Right 2009    hip replacement  . TONSILLECTOMY    . TOTAL KNEE ARTHROPLASTY Right 11/16/2013   Procedure: RIGHT TOTAL KNEE ARTHROPLASTY;  Surgeon: Vickey Huger, MD;  Location: [facility];  Service: Orthopedics;  Laterality: Right;  [name] VIDEO BRONCHOSCOPY Bilateral 12/05/2012   Procedure: VIDEO BRONCHOSCOPY WITHOUT  FLUORO;  Surgeon: Collene Gobble, MD;  Location: [facility];  Service: Cardiopulmonary;  Laterality: Bilateral;    SOCIAL HISTORY: Social History  Substance Use Topics  . Smoking status: Never Smoker  . Smokeless tobacco: Never Used  . Alcohol use No    FAMILY HISTORY: Family History  Problem Relation Age of Onset  . COPD Father        died at age 40  . Emphysema Father   . Heart Problems Father   . Heart disease Brother        stent with 90%  . COPD Mother   . Heart disease Mother   . Ovarian cancer Mother   . Emphysema Mother   . Hypertension Mother   . Hyperlipidemia Mother   . Heart Problems Mother   . Stroke Mother   . Thyroid disease Mother   . Cancer Mother   . Breast cancer Maternal Aunt   . Colon cancer Neg Hx   . Esophageal cancer Neg Hx   . Rectal cancer Neg Hx   . Stomach cancer Neg Hx     ROS: Review of Systems  Constitutional: Negative for weight loss.  Gastrointestinal: Negative for nausea and vomiting.  Musculoskeletal:       Negative muscle weakness  Psychiatric/Behavioral: Positive for depression. Negative for suicidal ideas.    PHYSICAL EXAM: Blood pressure 136/78, pulse 67, temperature 98.5 F (36.9 C), temperature source Oral, height 5\' 3"  (1.6 m), weight 186 lb (84.4 kg), SpO2 98 %. Body mass index is 32.95 kg/m. Physical Exam  Constitutional: She is oriented to person, place, and time. She appears well-developed and well-nourished.  Cardiovascular: Normal rate.   Pulmonary/Chest: Effort normal.  Musculoskeletal: Normal range of motion.  Neurological: She is oriented to person, place, and time.  Skin: Skin is warm and dry.  Psychiatric: She has a normal mood and affect. Her behavior is normal.  Vitals reviewed.   RECENT LABS AND TESTS: BMET    Component Value Date/Time   NA 141 12/03/2016 0925   K 4.4 12/03/2016 0925   CL 103 12/03/2016 0925   CO2 23 12/03/2016 0925   GLUCOSE 99 12/03/2016 0925   GLUCOSE 58 (L) 07/30/2016  1439   BUN 15 12/03/2016 0925   CREATININE 0.80 12/03/2016 0925   CALCIUM 9.3 12/03/2016 0925   GFRNONAA 77 12/03/2016 0925   GFRAA 89 12/03/2016 0925   Lab Results  Component Value Date   HGBA1C 5.7 (H) 12/03/2016   HGBA1C 6.0 (H) 07/25/2016   HGBA1C 5.8 (H) 04/17/2016   Lab Results  Component Value Date   INSULIN 13.4 12/03/2016   INSULIN 19.7 07/25/2016   INSULIN 13.9  04/17/2016   CBC    Component Value Date/Time   WBC 9.4 07/30/2016 1439   RBC 4.40 07/30/2016 1439   HGB 12.2 07/30/2016 1439   HGB 12.7 04/17/2016 1150   HCT 37.2 07/30/2016 1439   HCT 39.5 04/17/2016 1150   PLT 283.0 07/30/2016 1439   MCV 84.4 07/30/2016 1439   MCV 84 04/17/2016 1150   MCH 27.1 04/17/2016 1150   MCH 29.3 11/18/2013 0705   MCHC 32.9 07/30/2016 1439   RDW 15.9 (H) 07/30/2016 1439   RDW 16.0 (H) 04/17/2016 1150   LYMPHSABS 3.5 07/30/2016 1439   LYMPHSABS 2.7 04/17/2016 1150   MONOABS 0.7 07/30/2016 1439   EOSABS 0.4 07/30/2016 1439   EOSABS 0.2 04/17/2016 1150   BASOSABS 0.1 07/30/2016 1439   BASOSABS 0.0 04/17/2016 1150   Iron/TIBC/Ferritin/ %Sat No results found for: IRON, TIBC, FERRITIN, IRONPCTSAT Lipid Panel     Component Value Date/Time   CHOL 199 12/03/2016 0925   TRIG 87 12/03/2016 0925   HDL 84 12/03/2016 0925   CHOLHDL 3 10/24/2015 1054   VLDL 21.6 10/24/2015 1054   LDLCALC 98 12/03/2016 0925   LDLDIRECT 149.9 09/11/2010 0908   Hepatic Function Panel     Component Value Date/Time   PROT 6.7 12/03/2016 0925   ALBUMIN 4.1 12/03/2016 0925   AST 19 12/03/2016 0925   ALT 20 12/03/2016 0925   ALKPHOS 82 12/03/2016 0925   BILITOT 0.4 12/03/2016 0925   BILIDIR 0.0 10/24/2015 1054      Component Value Date/Time   TSH 1.890 04/17/2016 1150   TSH 1.15 10/24/2015 1054   TSH 1.30 04/25/2015 1132    ASSESSMENT AND PLAN: Vitamin D deficiency - Plan: Cholecalciferol (VITAMIN D3) 50000 units CAPS  Other depression - with emotional eating - Plan:  Naltrexone-Bupropion HCl ER (CONTRAVE) 8-90 MG TB12  Class 1 obesity with serious comorbidity and body mass index (BMI) of 32.0 to 32.9 in adult, unspecified obesity type  PLAN:  Vitamin D Deficiency Mery was informed that low vitamin D levels contributes to fatigue and are associated with obesity, breast, and colon cancer. She agrees to start to take prescription Vit D @50 ,000 IU every week #4 with no refills and will follow up for routine testing of vitamin D, at least 2-3 times per year. She was informed of the risk of over-replacement of vitamin D and agrees to not increase her dose unless he discusses this with [name] first. Jahnaya agrees to follow up with our clinic in 4 weeks.  Depression with Emotional Eating Behaviors We discussed behavior modification techniques today to help Izetta deal with her emotional eating and depression. She has agreed to start Contrave 2 pills bid #120 and will follow up as directed.   Obesity Evangelynn is currently in the action stage of change. As such, her goal is to continue with weight loss efforts She has agreed to keep a food journal with 1200 calories and 85 grams of protein daily Veroncia has been instructed to work up to a goal of 150 minutes of combined cardio and strengthening exercise per week for weight loss and overall health benefits. We discussed the following Behavioral Modification Strategies today: increasing lean protein intake and keep a strict food journal  Jnyah has agreed to follow up with our clinic in 4 weeks. She was informed of the importance of frequent follow up visits to maximize her success with intensive lifestyle modifications for her multiple health conditions.  Corey Skains, am acting as transcriptionist  for Lacy Duverney, PA-C  I have reviewed the above documentation for accuracy and completeness, and I agree with the above. -Lacy Duverney, PA-C  I have reviewed the above note and agree with the plan. -Dennard Nip, MD   OBESITY  BEHAVIORAL INTERVENTION VISIT  Today's visit was # 15 out of 38.  Starting weight: 195 lbs Starting date: 04/17/16 Today's weight : 186 lbs Today's date: 12/25/2016 Total lbs lost to date: 9 (Patients must lose 7 lbs in the first 6 months to continue with counseling)   ASK: We discussed the diagnosis of obesity with Annetta Maw today and Wendolyn agreed to give [name] permission to discuss obesity behavioral modification therapy today.  ASSESS: Davita has the diagnosis of obesity and her BMI today is 32.96 Chantilly is in the action stage of change   ADVISE: Adan was educated on the multiple health risks of obesity as well as the benefit of weight loss to improve her health. She was advised of the need for long term treatment and the importance of lifestyle modifications.  AGREE: Multiple dietary modification options and treatment options were discussed and  Kameo agreed to keep a food journal with 1200 calories and 85 grams of protein daily We discussed the following Behavioral Modification Strategies today: increasing lean protein intake and keep a strict food journal

## 2016-12-26 ENCOUNTER — Encounter (INDEPENDENT_AMBULATORY_CARE_PROVIDER_SITE_OTHER): Payer: Self-pay | Admitting: Physician Assistant

## 2017-01-10 ENCOUNTER — Other Ambulatory Visit (INDEPENDENT_AMBULATORY_CARE_PROVIDER_SITE_OTHER): Payer: Self-pay | Admitting: Family

## 2017-01-10 DIAGNOSIS — R7303 Prediabetes: Secondary | ICD-10-CM

## 2017-01-21 ENCOUNTER — Ambulatory Visit (INDEPENDENT_AMBULATORY_CARE_PROVIDER_SITE_OTHER): Payer: Medicare Other | Admitting: Physician Assistant

## 2017-01-22 ENCOUNTER — Ambulatory Visit (HOSPITAL_BASED_OUTPATIENT_CLINIC_OR_DEPARTMENT_OTHER)
Admission: RE | Admit: 2017-01-22 | Discharge: 2017-01-22 | Disposition: A | Discharge status: Home / Self Care | Payer: Medicare Other | Source: Ambulatory Visit | Attending: Family | Admitting: Family

## 2017-01-22 ENCOUNTER — Encounter: Payer: Self-pay | Admitting: Family

## 2017-01-22 ENCOUNTER — Ambulatory Visit: Payer: Medicare Other | Admitting: Family

## 2017-01-22 VITALS — BP 127/72 | HR 73 | Temp 98.2°F | Resp 16 | Ht 63.0 in | Wt 187.6 lb

## 2017-01-22 DIAGNOSIS — R0789 Other chest pain: Secondary | ICD-10-CM | POA: Insufficient documentation

## 2017-01-22 DIAGNOSIS — M940 Chondrocostal junction syndrome [Tietze]: Secondary | ICD-10-CM

## 2017-01-22 MED ORDER — MELOXICAM 7.5 MG PO TABS
7.5000 mg | ORAL_TABLET | Freq: Every day | ORAL | 0 refills | Status: DC
Start: 2017-01-22 — End: 2017-03-13

## 2017-01-22 MED ORDER — AZITHROMYCIN 250 MG PO TABS: ORAL_TABLET | ORAL | 0 refills | Status: DC

## 2017-01-22 MED ORDER — PREDNISONE 10 MG PO TABS: ORAL_TABLET | ORAL | 0 refills | Status: DC

## 2017-01-22 NOTE — Patient Instructions (Addendum)
Please begin meloxicam for pain. Complete x ray on the first floor.  Please begin prednisone for wheezing, and zpak for bronchitis. Let me know if cough/wheezing worsens or if it does not improve.

## 2017-01-22 NOTE — Progress Notes (Signed)
Subjective:    Patient ID: Brandy Dunlap, female    DOB: June 02, 1950, 67 y.o.   MRN: [id number]  HPI  Brandy Dunlap is a 66 yr old female who presents today with chief complaint of left sided breast pain.   Had complaint of mastalgia last visit. We completed a bilateral diagnostic mammogram which was negative. Reports pain was "so bad on Sunday I cried." No pain yesterday, today has pain again.  Reports pain along the left side of her sternum which is worse with pressure.  Tried 2 tramadol the other day but it did not help for 3 hours. Denies fever.  Hurts to take a deep breath due to this pain. Has had a mild cough for a few weeks.    Review of Systems    see HPI  Past Medical History:  Diagnosis Date  . Allergy   . Asthma   . B12 deficiency   . Back pain   . Bronchitis    hx  . Colon polyp 10/06/2003  . GERD (gastroesophageal reflux disease)   . Hypertension   . Low back pain   . Osteoarthritis   . Sinus trouble      Social History   Socioeconomic History  . Marital status: Single    Spouse name: Not on file  . Number of children: 1  . Years of education: Not on file  . Highest education level: Not on file  Social Needs  . Financial resource strain: Not on file  . Food insecurity - worry: Not on file  . Food insecurity - inability: Not on file  . Transportation needs - medical: Not on file  . Transportation needs - non-medical: Not on file  Occupational History  . Occupation: Product manager: [employer]  Tobacco Use  . Smoking status: Never Smoker  . Smokeless tobacco: Never Used  Substance and Sexual Activity  . Alcohol use: No  . Drug use: No  . Sexual activity: Yes    Partners: Male    Birth control/protection: Post-menopausal  Other Topics Concern  . Not on file  Social History Narrative   Divorced   One daughter- lives locally and one grandson   Retired Pharmacist, hospital,  Has masters degree   Enjoys reading, spending time with her grandson      Past Surgical History:  Procedure Laterality Date  . CHOLECYSTECTOMY    . COLONOSCOPY    . COLONOSCOPY W/ BIOPSIES  10/06/2003  . LUMBAR FUSION  2008  . PARTIAL HIP ARTHROPLASTY Right 2009    hip replacement  . TONSILLECTOMY    . TOTAL KNEE ARTHROPLASTY Right 11/16/2013   Procedure: RIGHT TOTAL KNEE ARTHROPLASTY;  Surgeon: Vickey Huger, MD;  Location: [facility];  Service: Orthopedics;  Laterality: Right;  [name] VIDEO BRONCHOSCOPY Bilateral 12/05/2012   Procedure: VIDEO BRONCHOSCOPY WITHOUT FLUORO;  Surgeon: Collene Gobble, MD;  Location: [facility];  Service: Cardiopulmonary;  Laterality: Bilateral;    Family History  Problem Relation Age of Onset  . COPD Father        died at age 4  . Emphysema Father   . Heart Problems Father   . Heart disease Brother        stent with 90%  . COPD Mother   . Heart disease Mother   . Ovarian cancer Mother   . Emphysema Mother   . Hypertension Mother   . Hyperlipidemia Mother   . Heart Problems Mother   .  Stroke Mother   . Thyroid disease Mother   . Cancer Mother   . Breast cancer Maternal Aunt   . Colon cancer Neg Hx   . Esophageal cancer Neg Hx   . Rectal cancer Neg Hx   . Stomach cancer Neg Hx     Allergies  Allergen Reactions  . Pantoprazole Sodium Other (See Comments)    Racing heart    Current Outpatient Medications on File Prior to Visit  Medication Sig Dispense Refill  . amLODipine (NORVASC) 5 MG tablet Take 1 tablet (5 mg total) by mouth daily. 90 tablet 1  . budesonide-formoterol (SYMBICORT) 160-4.5 MCG/ACT inhaler Inhale 2 puffs into the lungs 2 (two) times daily.    . Cholecalciferol (VITAMIN D3) 50000 units CAPS Take 1 Dose by mouth once a week. 4 capsule 0  . DEXILANT 60 MG capsule TAKE 1 CAPSULE BY MOUTH EVERY DAY 90 capsule 1  . diclofenac sodium (VOLTAREN) 1 % GEL APPLY (2G) BY TOPICAL ROUTE 4 TIMES EVERY DAY TO THE AFFECTED AREA(S)  1  . EPIPEN 2-PAK 0.3 MG/0.3ML SOAJ injection   1  . montelukast (SINGULAIR) 10 MG  tablet Take 10 mg by mouth every evening.  5  . Multiple Vitamin (MULTIVITAMIN) capsule Take 1 capsule by mouth daily.      . Naltrexone-Bupropion HCl ER (CONTRAVE) 8-90 MG TB12 Take 2 tablets by mouth 2 (two) times daily. 120 tablet 0  . omalizumab (XOLAIR) 150 MG injection Inject 150 mg into the skin every 14 (fourteen) days.    [name] PROAIR RESPICLICK 915 (90 BASE) MCG/ACT AEPB Inhale 2 puffs into the lungs every 6 (six) hours as needed.  0   No current facility-administered medications on file prior to visit.     BP 127/72 (BP Location: Right Arm, Cuff Size: Normal)   Pulse 73   Temp 98.2 F (36.8 C) (Oral)   Resp 16   Ht 5\' 3"  (1.6 m)   Wt 187 lb 9.6 oz (85.1 kg)   SpO2 98%   BMI 33.23 kg/m    Objective:   Physical Exam  Constitutional: She appears well-developed and well-nourished.  Cardiovascular: Normal rate, regular rhythm and normal heart sounds.  No murmur heard. Pulmonary/Chest: Effort normal and breath sounds normal. No respiratory distress. She has no wheezes.  Psychiatric: She has a normal mood and affect. Her behavior is normal. Judgment and thought content normal.  Breast:  Normal breast exam bilaterally.  + tenderness to palpation at the left lower sternal border and beneath the right lower portion of the left breast.        Assessment & Plan:  Costochondritis-symptoms most consistent with musculoskeletal pain.  Advised trial of meloxicam.  Will obtain chest x-ray and rib films just to make sure that she does not have any adjacent rib fractures or sternal abnormalities.  Normal breast imaging is reassuring.

## 2017-01-23 ENCOUNTER — Encounter: Payer: Self-pay | Admitting: Family

## 2017-01-29 ENCOUNTER — Ambulatory Visit (INDEPENDENT_AMBULATORY_CARE_PROVIDER_SITE_OTHER): Payer: Medicare Other | Admitting: Physician Assistant

## 2017-02-01 ENCOUNTER — Telehealth: Payer: Self-pay | Admitting: *Deleted

## 2017-02-01 NOTE — Telephone Encounter (Signed)
[employer]  Copied from [place]. Topic: General - Other >> Jan 31, 2017 11:58 AM Synthia Innocent wrote: Reason for CRM: [employer] wanted to make office aware they are faxing over DM review form

## 2017-02-21 ENCOUNTER — Other Ambulatory Visit: Payer: Self-pay | Admitting: Family

## 2017-02-24 ENCOUNTER — Other Ambulatory Visit: Payer: Self-pay | Admitting: Family

## 2017-03-01 ENCOUNTER — Other Ambulatory Visit (INDEPENDENT_AMBULATORY_CARE_PROVIDER_SITE_OTHER): Payer: Self-pay | Admitting: Physician Assistant

## 2017-03-01 DIAGNOSIS — F3289 Other specified depressive episodes: Secondary | ICD-10-CM

## 2017-03-04 ENCOUNTER — Ambulatory Visit (INDEPENDENT_AMBULATORY_CARE_PROVIDER_SITE_OTHER): Payer: Medicare Other | Admitting: Physician Assistant

## 2017-03-13 ENCOUNTER — Ambulatory Visit (INDEPENDENT_AMBULATORY_CARE_PROVIDER_SITE_OTHER): Payer: Medicare Other | Admitting: Physician Assistant

## 2017-03-13 VITALS — BP 125/72 | HR 74 | Temp 98.4°F | Ht 63.0 in | Wt 188.0 lb

## 2017-03-13 DIAGNOSIS — E669 Obesity, unspecified: Secondary | ICD-10-CM

## 2017-03-13 DIAGNOSIS — Z6833 Body mass index (BMI) 33.0-33.9, adult: Secondary | ICD-10-CM | POA: Diagnosis not present

## 2017-03-13 DIAGNOSIS — F3289 Other specified depressive episodes: Secondary | ICD-10-CM

## 2017-03-13 DIAGNOSIS — E559 Vitamin D deficiency, unspecified: Secondary | ICD-10-CM

## 2017-03-13 MED ORDER — NALTREXONE-BUPROPION HCL ER 8-90 MG PO TB12: 2.0000 | ORAL_TABLET | Freq: Two times a day (BID) | ORAL | 0 refills | Status: DC

## 2017-03-13 MED ORDER — VITAMIN D3 1.25 MG (50000 UT) PO CAPS: 1.0000 | ORAL_CAPSULE | ORAL | 0 refills | Status: DC

## 2017-03-13 NOTE — Progress Notes (Addendum)
Office: [phone]  /  Fax: [phone]   HPI:   Chief Complaint: OBESITY Brandy Dunlap is here to discuss her progress with her obesity treatment plan. She is on the keep a food journal with 1200 calories and 85 grams of protein daily and is following her eating plan approximately 50 % of the time. She states she is doing aerobics and yoga for 45 minutes 5 times per week. Brandy Dunlap has not been keeping a good journal and has deviated with her eating over the holidays. She is motivated to get back on track and continue with weight loss.  Her weight is 188 lb (85.3 kg) today and has gained 2 pounds since her last visit. She has lost 7 lbs since starting treatment with [name].  Vitamin D deficiency Brandy Dunlap has a diagnosis of vitamin D deficiency. She is currently taking prescription Vit D3 and denies nausea, vomiting or muscle weakness.  Depression with emotional eating behaviors Brandy Dunlap is struggling with emotional eating and using food for comfort to the extent that it is negatively impacting her health. She often snacks when she is not hungry. Brandy Dunlap sometimes feels she is out of control and then feels guilty that she made poor food choices. She has been working on behavior modification techniques to help reduce her emotional eating and has been somewhat successful. Her mood is stable and she shows no sign of suicidal or homicidal ideations.  Depression screen [facility] 2/9 04/17/2016 10/24/2015 10/25/2014  Decreased Interest 1 0 0  Down, Depressed, Hopeless 0 0 0  PHQ - 2 Score 1 0 0  Altered sleeping 1 - -  Tired, decreased energy 2 - -  Change in appetite 1 - -  Feeling bad or failure about yourself  1 - -  Trouble concentrating 1 - -  Moving slowly or fidgety/restless 0 - -  Suicidal thoughts 0 - -  PHQ-9 Score 7 - -   ALLERGIES: Allergies  Allergen Reactions  . Pantoprazole Sodium Other (See Comments)    Racing heart    MEDICATIONS: Current Outpatient Medications on File Prior to Visit  Medication  Sig Dispense Refill  . amLODipine (NORVASC) 5 MG tablet TAKE 1 TABLET BY MOUTH EVERY DAY 90 tablet 1  . budesonide-formoterol (SYMBICORT) 160-4.5 MCG/ACT inhaler Inhale 2 puffs into the lungs 2 (two) times daily.    [name] dexlansoprazole (DEXILANT) 60 MG capsule Take 1 capsule (60 mg total) by mouth daily. 90 capsule 1  . diclofenac sodium (VOLTAREN) 1 % GEL APPLY (2G) BY TOPICAL ROUTE 4 TIMES EVERY DAY TO THE AFFECTED AREA(S)  1  . EPIPEN 2-PAK 0.3 MG/0.3ML SOAJ injection   1  . montelukast (SINGULAIR) 10 MG tablet Take 10 mg by mouth every evening.  5  . Multiple Vitamin (MULTIVITAMIN) capsule Take 1 capsule by mouth daily.      . Naltrexone-Bupropion HCl ER (CONTRAVE) 8-90 MG TB12 Take 2 tablets by mouth 2 (two) times daily. 120 tablet 0  . omalizumab (XOLAIR) 150 MG injection Inject 150 mg into the skin every 14 (fourteen) days.    [name] PROAIR RESPICLICK 517 (90 BASE) MCG/ACT AEPB Inhale 2 puffs into the lungs every 6 (six) hours as needed.  0   No current facility-administered medications on file prior to visit.     PAST MEDICAL HISTORY: Past Medical History:  Diagnosis Date  . Allergy   . Asthma   . B12 deficiency   . Back pain   . Bronchitis    hx  .  Colon polyp 10/06/2003  . GERD (gastroesophageal reflux disease)   . Hypertension   . Low back pain   . Osteoarthritis   . Sinus trouble     PAST SURGICAL HISTORY: Past Surgical History:  Procedure Laterality Date  . CHOLECYSTECTOMY    . COLONOSCOPY    . COLONOSCOPY W/ BIOPSIES  10/06/2003  . LUMBAR FUSION  2008  . PARTIAL HIP ARTHROPLASTY Right 2009    hip replacement  . TONSILLECTOMY    . TOTAL KNEE ARTHROPLASTY Right 11/16/2013   Procedure: RIGHT TOTAL KNEE ARTHROPLASTY;  Surgeon: Vickey Huger, MD;  Location: [facility];  Service: Orthopedics;  Laterality: Right;  [name] VIDEO BRONCHOSCOPY Bilateral 12/05/2012   Procedure: VIDEO BRONCHOSCOPY WITHOUT FLUORO;  Surgeon: Collene Gobble, MD;  Location: [facility];  Service:  Cardiopulmonary;  Laterality: Bilateral;    SOCIAL HISTORY: Social History   Tobacco Use  . Smoking status: Never Smoker  . Smokeless tobacco: Never Used  Substance Use Topics  . Alcohol use: No  . Drug use: No    FAMILY HISTORY: Family History  Problem Relation Age of Onset  . COPD Father        died at age 68  . Emphysema Father   . Heart Problems Father   . Heart disease Brother        stent with 90%  . COPD Mother   . Heart disease Mother   . Ovarian cancer Mother   . Emphysema Mother   . Hypertension Mother   . Hyperlipidemia Mother   . Heart Problems Mother   . Stroke Mother   . Thyroid disease Mother   . Cancer Mother   . Breast cancer Maternal Aunt   . Colon cancer Neg Hx   . Esophageal cancer Neg Hx   . Rectal cancer Neg Hx   . Stomach cancer Neg Hx     ROS: Review of Systems  Constitutional: Negative for weight loss.  Gastrointestinal: Negative for nausea and vomiting.  Musculoskeletal:       Negative muscle weakness  Psychiatric/Behavioral: Positive for depression. Negative for suicidal ideas.    PHYSICAL EXAM: Blood pressure 125/72, pulse 74, temperature 98.4 F (36.9 C), temperature source Oral, height 5\' 3"  (1.6 m), weight 188 lb (85.3 kg), SpO2 97 %. Body mass index is 33.3 kg/m. Physical Exam  Constitutional: She is oriented to person, place, and time. She appears well-developed and well-nourished.  Cardiovascular: Normal rate.  Pulmonary/Chest: Effort normal.  Musculoskeletal: Normal range of motion.  Neurological: She is oriented to person, place, and time.  Skin: Skin is warm and dry.  Psychiatric: She has a normal mood and affect. Her behavior is normal.  Vitals reviewed.   RECENT LABS AND TESTS: BMET    Component Value Date/Time   NA 141 12/03/2016 0925   K 4.4 12/03/2016 0925   CL 103 12/03/2016 0925   CO2 23 12/03/2016 0925   GLUCOSE 99 12/03/2016 0925   GLUCOSE 58 (L) 07/30/2016 1439   BUN 15 12/03/2016 0925    CREATININE 0.80 12/03/2016 0925   CALCIUM 9.3 12/03/2016 0925   GFRNONAA 77 12/03/2016 0925   GFRAA 89 12/03/2016 0925   Lab Results  Component Value Date   HGBA1C 5.7 (H) 12/03/2016   HGBA1C 6.0 (H) 07/25/2016   HGBA1C 5.8 (H) 04/17/2016   Lab Results  Component Value Date   INSULIN 13.4 12/03/2016   INSULIN 19.7 07/25/2016   INSULIN 13.9 04/17/2016   CBC    Component Value  Date/Time   WBC 9.4 07/30/2016 1439   RBC 4.40 07/30/2016 1439   HGB 12.2 07/30/2016 1439   HGB 12.7 04/17/2016 1150   HCT 37.2 07/30/2016 1439   HCT 39.5 04/17/2016 1150   PLT 283.0 07/30/2016 1439   MCV 84.4 07/30/2016 1439   MCV 84 04/17/2016 1150   MCH 27.1 04/17/2016 1150   MCH 29.3 11/18/2013 0705   MCHC 32.9 07/30/2016 1439   RDW 15.9 (H) 07/30/2016 1439   RDW 16.0 (H) 04/17/2016 1150   LYMPHSABS 3.5 07/30/2016 1439   LYMPHSABS 2.7 04/17/2016 1150   MONOABS 0.7 07/30/2016 1439   EOSABS 0.4 07/30/2016 1439   EOSABS 0.2 04/17/2016 1150   BASOSABS 0.1 07/30/2016 1439   BASOSABS 0.0 04/17/2016 1150   Iron/TIBC/Ferritin/ %Sat No results found for: IRON, TIBC, FERRITIN, IRONPCTSAT Lipid Panel     Component Value Date/Time   CHOL 199 12/03/2016 0925   TRIG 87 12/03/2016 0925   HDL 84 12/03/2016 0925   CHOLHDL 3 10/24/2015 1054   VLDL 21.6 10/24/2015 1054   LDLCALC 98 12/03/2016 0925   LDLDIRECT 149.9 09/11/2010 0908   Hepatic Function Panel     Component Value Date/Time   PROT 6.7 12/03/2016 0925   ALBUMIN 4.1 12/03/2016 0925   AST 19 12/03/2016 0925   ALT 20 12/03/2016 0925   ALKPHOS 82 12/03/2016 0925   BILITOT 0.4 12/03/2016 0925   BILIDIR 0.0 10/24/2015 1054      Component Value Date/Time   TSH 1.890 04/17/2016 1150   TSH 1.15 10/24/2015 1054   TSH 1.30 04/25/2015 1132  Results for SHAJUAN, MUSSO (MRN [id number]) as of 03/13/2017 13:18  Ref. Range 12/03/2016 09:25  Vitamin D, 25-Hydroxy Latest Ref Range: 30.0 - 100.0 ng/mL 32.1    ASSESSMENT AND PLAN: Vitamin D  deficiency - Plan: Cholecalciferol (VITAMIN D3) 50000 units CAPS, Cholecalciferol (VITAMIN D3) 50000 units CAPS  Other depression - with emotional eating - Plan: Naltrexone-Bupropion HCl ER (CONTRAVE) 8-90 MG TB12  Class 1 obesity with serious comorbidity and body mass index (BMI) of 33.0 to 33.9 in adult, unspecified obesity type  PLAN:  Vitamin D Deficiency Brandy Dunlap was informed that low vitamin D levels contributes to fatigue and are associated with obesity, breast, and colon cancer. Brandy Dunlap agrees to continue taking prescription Vit D3 @50 ,000 IU every week #12 and we will refill for 1 month. She will follow up for routine testing of vitamin D, at least 2-3 times per year. She was informed of the risk of over-replacement of vitamin D and agrees to not increase her dose unless she discusses this with [name] first. Brandy Dunlap agrees to follow up with our clinic in 3 weeks.  Depression with Emotional Eating Behaviors We discussed behavior modification techniques today to help Brandy Dunlap deal with her emotional eating and depression. Brandy Dunlap agrees to continue taking Contrave 8-90 mg TB12 2 tab BID #120 and we will refill for 1 month. Brandy Dunlap agrees to follow up with our clinic in 3 weeks.  Obesity Brandy Dunlap is currently in the action stage of change. As such, her goal is to continue with weight loss efforts She has agreed to keep a food journal with 1200 calories and 85 grams of protein daily Brandy Dunlap has been instructed to work up to a goal of 150 minutes of combined cardio and strengthening exercise per week for weight loss and overall health benefits. We discussed the following Behavioral Modification Strategies today: increasing lean protein intake and planning for success   Brandy Dunlap has  agreed to follow up with our clinic in 3 weeks. She was informed of the importance of frequent follow up visits to maximize her success with intensive lifestyle modifications for her multiple health conditions.    OBESITY BEHAVIORAL  INTERVENTION VISIT  Today's visit was # 16 out of 22.  Starting weight: 195 lbs Starting date: 04/17/16 Today's weight : 188 lbs  Today's date: 03/13/2017 Total lbs lost to date: 7 (Patients must lose 7 lbs in the first 6 months to continue with counseling)   ASK: We discussed the diagnosis of obesity with Brandy Dunlap today and Brandy Dunlap agreed to give [name] permission to discuss obesity behavioral modification therapy today.  ASSESS: Brandy Dunlap has the diagnosis of obesity and her BMI today is 33.31 Brandy Dunlap is in the action stage of change   ADVISE: Brandy Dunlap was educated on the multiple health risks of obesity as well as the benefit of weight loss to improve her health. She was advised of the need for long term treatment and the importance of lifestyle modifications.  AGREE: Multiple dietary modification options and treatment options were discussed and  Brandy Dunlap agreed to the above obesity treatment plan.   Wilhemena Durie, am acting as transcriptionist for Lacy Duverney, PA-C I, Lacy Duverney [facility], have reviewed this note and agree with its contents.  I have reviewed the above documentation for accuracy and completeness, and I agree with the above. -Dennard Nip, MD

## 2017-04-01 ENCOUNTER — Encounter: Payer: Self-pay | Admitting: Family

## 2017-04-01 DIAGNOSIS — R0789 Other chest pain: Secondary | ICD-10-CM

## 2017-04-01 NOTE — Addendum Note (Signed)
Addended by: [name] on: 04/01/2017 08:55 PM   Modules accepted: Orders

## 2017-04-04 ENCOUNTER — Ambulatory Visit (INDEPENDENT_AMBULATORY_CARE_PROVIDER_SITE_OTHER): Payer: Medicare Other | Admitting: Physician Assistant

## 2017-04-04 VITALS — BP 119/73 | HR 74 | Temp 98.3°F | Ht 63.0 in | Wt 187.0 lb

## 2017-04-04 DIAGNOSIS — I10 Essential (primary) hypertension: Secondary | ICD-10-CM | POA: Diagnosis not present

## 2017-04-04 DIAGNOSIS — Z6833 Body mass index (BMI) 33.0-33.9, adult: Secondary | ICD-10-CM | POA: Diagnosis not present

## 2017-04-04 DIAGNOSIS — E7849 Other hyperlipidemia: Secondary | ICD-10-CM

## 2017-04-04 DIAGNOSIS — Z9189 Other specified personal risk factors, not elsewhere classified: Secondary | ICD-10-CM

## 2017-04-04 DIAGNOSIS — E669 Obesity, unspecified: Secondary | ICD-10-CM

## 2017-04-04 DIAGNOSIS — R7303 Prediabetes: Secondary | ICD-10-CM | POA: Diagnosis not present

## 2017-04-04 DIAGNOSIS — E559 Vitamin D deficiency, unspecified: Secondary | ICD-10-CM

## 2017-04-04 NOTE — Progress Notes (Signed)
Office: [phone]  /  Fax: [phone]   HPI:   Chief Complaint: OBESITY Brandy Dunlap is here to discuss her progress with her obesity treatment plan. She is on the keep a food journal with 1200 calories and 85 grams of protein  and is following her eating plan approximately 90 % of the time. She states she is doing aerobics yoga for 45 minutes 3 times per week. Ollie continues to do well with weight loss. She states she is busy with work and has not been keeping up with her protein intake. She is encouraged to go back to the structured meal plan. Her weight is 187 lb (84.8 kg) today and has had a weight loss of 1 pound over a period of 3 weeks since her last visit. She has lost 8 lbs since starting treatment with [name].  Hypertension Brandy Dunlap is a 67 y.o. female with hypertension. Brandy Dunlap denies chest pain or shortness of breath on exertion. She is working weight loss to help control her blood pressure with the goal of decreasing her risk of heart attack and stroke. [name] blood pressure is stable.  Hyperlipidemia Brandy Dunlap has hyperlipidemia and is not on medications. Her LDL and triglycerides have been elevated in the past. She has been trying to improve her cholesterol levels with intensive lifestyle modification including a low saturated fat diet, exercise and weight loss. She denies any chest pain, claudication or myalgias.  At risk for cardiovascular disease Brandy Dunlap is at a higher than average risk for cardiovascular disease due to obesity, hypertension and hyperlipidemia. She currently denies any chest pain.  Vitamin D deficiency Brandy Dunlap has a diagnosis of vitamin D deficiency. She is currently taking vit D and denies nausea, vomiting or muscle weakness.   Ref. Range 12/03/2016 09:25  Vitamin D, 25-Hydroxy Latest Ref Range: 30.0 - 100.0 ng/mL 32.1   Pre-Diabetes Brandy Dunlap has a diagnosis of pre-diabetes based on her elevated Hgb A1c and was informed this puts her at greater risk of  developing diabetes. She is not taking metformin currently and continues to work on diet and exercise to decrease risk of diabetes. She denies nausea, polyphagia or hypoglycemia.  ALLERGIES: Allergies  Allergen Reactions  . Pantoprazole Sodium Other (See Comments)    Racing heart    MEDICATIONS: Current Outpatient Medications on File Prior to Visit  Medication Sig Dispense Refill  . amLODipine (NORVASC) 5 MG tablet TAKE 1 TABLET BY MOUTH EVERY DAY 90 tablet 1  . budesonide-formoterol (SYMBICORT) 160-4.5 MCG/ACT inhaler Inhale 2 puffs into the lungs 2 (two) times daily.    . Cholecalciferol (VITAMIN D3) 50000 units CAPS Take 1 Dose by mouth once a week. 12 capsule 0  . dexlansoprazole (DEXILANT) 60 MG capsule Take 1 capsule (60 mg total) by mouth daily. 90 capsule 1  . diclofenac sodium (VOLTAREN) 1 % GEL APPLY (2G) BY TOPICAL ROUTE 4 TIMES EVERY DAY TO THE AFFECTED AREA(S)  1  . EPIPEN 2-PAK 0.3 MG/0.3ML SOAJ injection   1  . montelukast (SINGULAIR) 10 MG tablet Take 10 mg by mouth every evening.  5  . Multiple Vitamin (MULTIVITAMIN) capsule Take 1 capsule by mouth daily.      . Naltrexone-Bupropion HCl ER (CONTRAVE) 8-90 MG TB12 Take 2 tablets by mouth 2 (two) times daily. 120 tablet 0  . omalizumab (XOLAIR) 150 MG injection Inject 150 mg into the skin every 14 (fourteen) days.    [name] PROAIR RESPICLICK 976 (90 BASE) MCG/ACT AEPB Inhale 2 puffs into  the lungs every 6 (six) hours as needed.  0   No current facility-administered medications on file prior to visit.     PAST MEDICAL HISTORY: Past Medical History:  Diagnosis Date  . Allergy   . Asthma   . B12 deficiency   . Back pain   . Bronchitis    hx  . Colon polyp 10/06/2003  . GERD (gastroesophageal reflux disease)   . Hypertension   . Low back pain   . Osteoarthritis   . Sinus trouble     PAST SURGICAL HISTORY: Past Surgical History:  Procedure Laterality Date  . CHOLECYSTECTOMY    . COLONOSCOPY    . COLONOSCOPY W/  BIOPSIES  10/06/2003  . LUMBAR FUSION  2008  . PARTIAL HIP ARTHROPLASTY Right 2009    hip replacement  . TONSILLECTOMY    . TOTAL KNEE ARTHROPLASTY Right 11/16/2013   Procedure: RIGHT TOTAL KNEE ARTHROPLASTY;  Surgeon: Vickey Huger, MD;  Location: [facility];  Service: Orthopedics;  Laterality: Right;  [name] VIDEO BRONCHOSCOPY Bilateral 12/05/2012   Procedure: VIDEO BRONCHOSCOPY WITHOUT FLUORO;  Surgeon: Collene Gobble, MD;  Location: [facility];  Service: Cardiopulmonary;  Laterality: Bilateral;    SOCIAL HISTORY: Social History   Tobacco Use  . Smoking status: Never Smoker  . Smokeless tobacco: Never Used  Substance Use Topics  . Alcohol use: No  . Drug use: No    FAMILY HISTORY: Family History  Problem Relation Age of Onset  . COPD Father        died at age 56  . Emphysema Father   . Heart Problems Father   . Heart disease Brother        stent with 90%  . COPD Mother   . Heart disease Mother   . Ovarian cancer Mother   . Emphysema Mother   . Hypertension Mother   . Hyperlipidemia Mother   . Heart Problems Mother   . Stroke Mother   . Thyroid disease Mother   . Cancer Mother   . Breast cancer Maternal Aunt   . Colon cancer Neg Hx   . Esophageal cancer Neg Hx   . Rectal cancer Neg Hx   . Stomach cancer Neg Hx     ROS: Review of Systems  Constitutional: Positive for weight loss.  Respiratory: Negative for shortness of breath (on exertion).   Cardiovascular: Negative for chest pain and claudication.  Gastrointestinal: Negative for nausea and vomiting.  Musculoskeletal: Negative for myalgias.       Negative for muscle weakness  Endo/Heme/Allergies:       Negative for polyphagia Negative for hypoglycemia    PHYSICAL EXAM: Blood pressure 119/73, pulse 74, temperature 98.3 F (36.8 C), temperature source Oral, height 5\' 3"  (1.6 m), weight 187 lb (84.8 kg), SpO2 98 %. Body mass index is 33.13 kg/m. Physical Exam  Constitutional: She is oriented to person, place, and  time. She appears well-developed and well-nourished.  Cardiovascular: Normal rate.  Pulmonary/Chest: Effort normal.  Musculoskeletal: Normal range of motion.  Neurological: She is oriented to person, place, and time.  Skin: Skin is warm and dry.  Psychiatric: She has a normal mood and affect. Her behavior is normal.  Vitals reviewed.   RECENT LABS AND TESTS: BMET    Component Value Date/Time   NA 141 12/03/2016 0925   K 4.4 12/03/2016 0925   CL 103 12/03/2016 0925   CO2 23 12/03/2016 0925   GLUCOSE 99 12/03/2016 0925   GLUCOSE 58 (L) 07/30/2016  1439   BUN 15 12/03/2016 0925   CREATININE 0.80 12/03/2016 0925   CALCIUM 9.3 12/03/2016 0925   GFRNONAA 77 12/03/2016 0925   GFRAA 89 12/03/2016 0925   Lab Results  Component Value Date   HGBA1C 5.7 (H) 12/03/2016   HGBA1C 6.0 (H) 07/25/2016   HGBA1C 5.8 (H) 04/17/2016   Lab Results  Component Value Date   INSULIN 13.4 12/03/2016   INSULIN 19.7 07/25/2016   INSULIN 13.9 04/17/2016   CBC    Component Value Date/Time   WBC 9.4 07/30/2016 1439   RBC 4.40 07/30/2016 1439   HGB 12.2 07/30/2016 1439   HGB 12.7 04/17/2016 1150   HCT 37.2 07/30/2016 1439   HCT 39.5 04/17/2016 1150   PLT 283.0 07/30/2016 1439   MCV 84.4 07/30/2016 1439   MCV 84 04/17/2016 1150   MCH 27.1 04/17/2016 1150   MCH 29.3 11/18/2013 0705   MCHC 32.9 07/30/2016 1439   RDW 15.9 (H) 07/30/2016 1439   RDW 16.0 (H) 04/17/2016 1150   LYMPHSABS 3.5 07/30/2016 1439   LYMPHSABS 2.7 04/17/2016 1150   MONOABS 0.7 07/30/2016 1439   EOSABS 0.4 07/30/2016 1439   EOSABS 0.2 04/17/2016 1150   BASOSABS 0.1 07/30/2016 1439   BASOSABS 0.0 04/17/2016 1150   Iron/TIBC/Ferritin/ %Sat No results found for: IRON, TIBC, FERRITIN, IRONPCTSAT Lipid Panel     Component Value Date/Time   CHOL 199 12/03/2016 0925   TRIG 87 12/03/2016 0925   HDL 84 12/03/2016 0925   CHOLHDL 3 10/24/2015 1054   VLDL 21.6 10/24/2015 1054   LDLCALC 98 12/03/2016 0925   LDLDIRECT 149.9  09/11/2010 0908   Hepatic Function Panel     Component Value Date/Time   PROT 6.7 12/03/2016 0925   ALBUMIN 4.1 12/03/2016 0925   AST 19 12/03/2016 0925   ALT 20 12/03/2016 0925   ALKPHOS 82 12/03/2016 0925   BILITOT 0.4 12/03/2016 0925   BILIDIR 0.0 10/24/2015 1054      Component Value Date/Time   TSH 1.890 04/17/2016 1150   TSH 1.15 10/24/2015 1054   TSH 1.30 04/25/2015 1132     Ref. Range 12/03/2016 09:25  Vitamin D, 25-Hydroxy Latest Ref Range: 30.0 - 100.0 ng/mL 32.1   ASSESSMENT AND PLAN: Essential hypertension - Plan: Comprehensive metabolic panel  Vitamin D deficiency - Plan: VITAMIN D 25 Hydroxy (Vit-D Deficiency, Fractures)  Other hyperlipidemia - Plan: Lipid Panel With LDL/HDL Ratio  Prediabetes - Plan: Hemoglobin A1c, Insulin, random  At risk for heart disease  Class 1 obesity with serious comorbidity and body mass index (BMI) of 33.0 to 33.9 in adult, unspecified obesity type  PLAN:  Hypertension We discussed sodium restriction, working on healthy weight loss, and a regular exercise program as the means to achieve improved blood pressure control. Quanisha agreed with this plan and agreed to follow up as directed. We will check labs and continue to monitor her blood pressure as well as her progress with the above lifestyle modifications. She will continue her medications as prescribed and will watch for signs of hypotension as she continues her lifestyle modifications.  Hyperlipidemia Aide was informed of the American Heart Association Guidelines emphasizing intensive lifestyle modifications as the first line treatment for hyperlipidemia. We discussed many lifestyle modifications today in depth, and Gavyn will continue to work on decreasing saturated fats such as fatty red meat, butter and many fried foods. She will also increase vegetables and lean protein in her diet and continue to work on exercise and weight  loss efforts. We will check labs and Andreanna agrees to  follow up as directed.  Cardiovascular risk counseling Skyleigh was given extended (15 minutes) coronary artery disease prevention counseling today. She is 67 y.o. female and has risk factors for heart disease including obesity, hypertension and hyperlipidemia. We discussed intensive lifestyle modifications today with an emphasis on specific weight loss instructions and strategies. Pt was also informed of the importance of increasing exercise and decreasing saturated fats to help prevent heart disease.  Vitamin D Deficiency Cylie was informed that low vitamin D levels contributes to fatigue and are associated with obesity, breast, and colon cancer. She agrees to continue to take prescription Vit D @50 ,000 IU every week. We will check labs and will follow up for routine testing of vitamin D, at least 2-3 times per year. She was informed of the risk of over-replacement of vitamin D and agrees to not increase her dose unless she discusses this with [name] first.  Pre-Diabetes Sanaz will continue to work on weight loss, exercise, and decreasing simple carbohydrates in her diet to help decrease the risk of diabetes. She was informed that eating too many simple carbohydrates or too many calories at one sitting increases the likelihood of GI side effects. We will check labs and Jamirah agreed to follow up with [name] as directed to monitor her progress.  Obesity Roosevelt is currently in the action stage of change. As such, her goal is to continue with weight loss efforts She has agreed to follow the Category 2 plan Justeen has been instructed to work up to a goal of 150 minutes of combined cardio and strengthening exercise per week for weight loss and overall health benefits. We discussed the following Behavioral Modification Strategies today: keeping healthy foods in the home, increasing lean protein intake and work on meal planning and easy cooking plans  Malaijah has agreed to follow up with our clinic in 3 weeks. She was  informed of the importance of frequent follow up visits to maximize her success with intensive lifestyle modifications for her multiple health conditions.   OBESITY BEHAVIORAL INTERVENTION VISIT  Today's visit was # 17 out of 22.  Starting weight: 195 lbs Starting date: 04/17/16 Today's weight : 187 lbs Today's date: 04/04/2017 Total lbs lost to date: 8 (Patients must lose 7 lbs in the first 6 months to continue with counseling)   ASK: We discussed the diagnosis of obesity with Annetta Maw today and Karan agreed to give [name] permission to discuss obesity behavioral modification therapy today.  ASSESS: Cassaundra has the diagnosis of obesity and her BMI today is 33.13 Blondina is in the action stage of change   ADVISE: Yvonne was educated on the multiple health risks of obesity as well as the benefit of weight loss to improve her health. She was advised of the need for long term treatment and the importance of lifestyle modifications.  AGREE: Multiple dietary modification options and treatment options were discussed and  Dashley agreed to the above obesity treatment plan.   Corey Skains, am acting as transcriptionist for [employer], PA-C I, Lacy Duverney [facility], have reviewed this note and agree with its content

## 2017-04-05 LAB — COMPREHENSIVE METABOLIC PANEL
ALT: 17 IU/L (ref 0–32)
AST: 18 IU/L (ref 0–40)
Albumin/Globulin Ratio: 1.5 (ref 1.2–2.2)
Albumin: 4.2 g/dL (ref 3.6–4.8)
Alkaline Phosphatase: 72 IU/L (ref 39–117)
BUN/Creatinine Ratio: 14 (ref 12–28)
BUN: 10 mg/dL (ref 8–27)
Bilirubin Total: 0.3 mg/dL (ref 0.0–1.2)
CO2: 21 mmol/L (ref 20–29)
Calcium: 9.3 mg/dL (ref 8.7–10.3)
Chloride: 105 mmol/L (ref 96–106)
Creatinine, Ser: 0.69 mg/dL (ref 0.57–1.00)
GFR calc Af Amer: 105 mL/min/{1.73_m2} (ref >59)
GFR calc non Af Amer: 91 mL/min/{1.73_m2} (ref >59)
Globulin, Total: 2.8 g/dL (ref 1.5–4.5)
Glucose: 108 mg/dL — ABNORMAL HIGH (ref 65–99)
Potassium: 4.5 mmol/L (ref 3.5–5.2)
Sodium: 141 mmol/L (ref 134–144)
Total Protein: 7 g/dL (ref 6.0–8.5)

## 2017-04-05 LAB — VITAMIN D 25 HYDROXY (VIT D DEFICIENCY, FRACTURES): Vit D, 25-Hydroxy: 51.3 ng/mL (ref 30.0–100.0)

## 2017-04-05 LAB — LIPID PANEL WITH LDL/HDL RATIO
Cholesterol, Total: 204 mg/dL — ABNORMAL HIGH (ref 100–199)
HDL: 62 mg/dL (ref >39)
LDL Calculated: 116 mg/dL — ABNORMAL HIGH (ref 0–99)
LDl/HDL Ratio: 1.9 ratio (ref 0.0–3.2)
Triglycerides: 130 mg/dL (ref 0–149)
VLDL Cholesterol Cal: 26 mg/dL (ref 5–40)

## 2017-04-05 LAB — INSULIN, RANDOM: INSULIN: 15.6 u[IU]/mL (ref 2.6–24.9)

## 2017-04-05 LAB — HEMOGLOBIN A1C
Est. average glucose Bld gHb Est-mCnc: 134 mg/dL
Hgb A1c MFr Bld: 6.3 % — ABNORMAL HIGH (ref 4.8–5.6)

## 2017-04-11 NOTE — Telephone Encounter (Signed)
Noted  

## 2017-04-18 ENCOUNTER — Encounter: Payer: Self-pay | Admitting: Cardiology

## 2017-04-18 ENCOUNTER — Ambulatory Visit: Payer: Medicare Other | Admitting: Cardiology

## 2017-04-18 VITALS — BP 130/86 | HR 69 | Ht 63.0 in | Wt 191.0 lb

## 2017-04-18 DIAGNOSIS — G8929 Other chronic pain: Secondary | ICD-10-CM | POA: Diagnosis not present

## 2017-04-18 DIAGNOSIS — M545 Low back pain: Secondary | ICD-10-CM | POA: Diagnosis not present

## 2017-04-18 DIAGNOSIS — I10 Essential (primary) hypertension: Secondary | ICD-10-CM

## 2017-04-18 DIAGNOSIS — R0789 Other chest pain: Secondary | ICD-10-CM | POA: Diagnosis not present

## 2017-04-18 DIAGNOSIS — J45909 Unspecified asthma, uncomplicated: Secondary | ICD-10-CM | POA: Insufficient documentation

## 2017-04-18 DIAGNOSIS — J4540 Moderate persistent asthma, uncomplicated: Secondary | ICD-10-CM | POA: Insufficient documentation

## 2017-04-18 HISTORY — DX: Other chest pain: R07.89

## 2017-04-18 NOTE — Progress Notes (Signed)
Cardiology Consultation:    Date:  04/18/2017   ID:  Brandy Dunlap, DOB [birth year], MRN [id number]  PCP:  [name], NP  Cardiologist:  Jenne Campus, MD   Referring MD: [name], NP   Chief Complaint  Patient presents with  . Chest Pain  . Fatigue  I have chest pain  History of Present Illness:    Brandy Dunlap is a 67 y.o. female who is being seen today for the evaluation of pain at the request of [name], NP.  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  She goes to gym on the regular basis since September she started experiencing chest pain.  Pain could last for hours straight pain is not related to exercise she graded the sensation 5 in scale up to 10.  Taking deep breath coughing does not make usually much difference but sometimes she said when she take really deep breath she would feel it.  Again she goes to gym on the regular basis she can be been exercising quite aggressively with no symptoms. Risk factors for coronary artery disease include family history of premature coronary artery disease, essential hypertension.  She told me that her cholesterol is good. Past Medical History:  Diagnosis Date  . Allergy   . Asthma   . B12 deficiency   . Back pain   . Bronchitis    hx  . Colon polyp 10/06/2003  . GERD (gastroesophageal reflux disease)   . Hypertension   . Low back pain   . Osteoarthritis   . Sinus trouble     Past Surgical History:  Procedure Laterality Date  . CHOLECYSTECTOMY    . COLONOSCOPY    . COLONOSCOPY W/ BIOPSIES  10/06/2003  . LUMBAR FUSION  2008  . PARTIAL HIP ARTHROPLASTY Right 2009    hip replacement  . TONSILLECTOMY    . TOTAL KNEE ARTHROPLASTY Right 11/16/2013   Procedure: RIGHT TOTAL KNEE ARTHROPLASTY;  Surgeon: Vickey Huger, MD;  Location: [place];  Service: Orthopedics;  Laterality: Right;  [name] VIDEO BRONCHOSCOPY Bilateral 12/05/2012   Procedure: VIDEO BRONCHOSCOPY WITHOUT FLUORO;   Surgeon: Collene Gobble, MD;  Location: [facility];  Service: Cardiopulmonary;  Laterality: Bilateral;    Current Medications: Current Meds  Medication Sig  . amLODipine (NORVASC) 5 MG tablet TAKE 1 TABLET BY MOUTH EVERY DAY  . budesonide-formoterol (SYMBICORT) 160-4.5 MCG/ACT inhaler Inhale 2 puffs into the lungs 2 (two) times daily.  . Cholecalciferol (VITAMIN D3) 50000 units CAPS Take 1 Dose by mouth once a week.  [name] dexlansoprazole (DEXILANT) 60 MG capsule Take 1 capsule (60 mg total) by mouth daily.  . diclofenac sodium (VOLTAREN) 1 % GEL APPLY (2G) BY TOPICAL ROUTE 4 TIMES EVERY DAY TO THE AFFECTED AREA(S)  . EPIPEN 2-PAK 0.3 MG/0.3ML SOAJ injection   . montelukast (SINGULAIR) 10 MG tablet Take 10 mg by mouth every evening.  . Multiple Vitamin (MULTIVITAMIN) capsule Take 1 capsule by mouth daily.    . Naltrexone-Bupropion HCl ER (CONTRAVE) 8-90 MG TB12 Take 2 tablets by mouth 2 (two) times daily.  [name] omalizumab (XOLAIR) 150 MG injection Inject 150 mg into the skin every 14 (fourteen) days.  [name] PROAIR RESPICLICK 301 (90 BASE) MCG/ACT AEPB Inhale 2 puffs into the lungs every 6 (six) hours as needed.     Allergies:   Pantoprazole sodium   Social History   Socioeconomic History  . Marital status: Single    Spouse name: None  .  Number of children: 1  . Years of education: None  . Highest education level: None  Social Needs  . Financial resource strain: None  . Food insecurity - worry: None  . Food insecurity - inability: None  . Transportation needs - medical: None  . Transportation needs - non-medical: None  Occupational History  . Occupation: Product manager: [employer]  Tobacco Use  . Smoking status: Never Smoker  . Smokeless tobacco: Never Used  Substance and Sexual Activity  . Alcohol use: No  . Drug use: No  . Sexual activity: Yes    Partners: Male    Birth control/protection: Post-menopausal  Other Topics Concern  . None  Social History  Narrative   Divorced   One daughter- lives locally and one grandson   Retired Pharmacist, hospital,  Has masters degree   Enjoys reading, spending time with her grandson     Family History: The patient's family history includes Breast cancer in her maternal aunt; COPD in her father and mother; Cancer in her mother; Emphysema in her father and mother; Heart Problems in her father and mother; Heart disease in her brother and mother; Hyperlipidemia in her mother; Hypertension in her mother; Ovarian cancer in her mother; Stroke in her mother; Thyroid disease in her mother. There is no history of Colon cancer, Esophageal cancer, Rectal cancer, or Stomach cancer. ROS:   Please see the history of present illness.    All 14 point review of systems negative except as described per history of present illness.  EKGs/Labs/Other Studies Reviewed:    The following studies were reviewed today:   EKG:  EKG is  ordered today.  The ekg ordered today demonstrates normal sinus rhythm normal P interval normal QS complex duration morphology no ST-T segment changes  Recent Labs: 07/30/2016: Hemoglobin 12.2; Platelets 283.0 04/04/2017: ALT 17; BUN 10; Creatinine, Ser 0.69; Potassium 4.5; Sodium 141  Recent Lipid Panel    Component Value Date/Time   CHOL 204 (H) 04/04/2017 0838   TRIG 130 04/04/2017 0838   HDL 62 04/04/2017 0838   CHOLHDL 3 10/24/2015 1054   VLDL 21.6 10/24/2015 1054   LDLCALC 116 (H) 04/04/2017 0838   LDLDIRECT 149.9 09/11/2010 0908    Physical Exam:    VS:  BP 130/86 (BP Location: Right Arm, Patient Position: Sitting, Cuff Size: Normal)   Pulse 69   Ht 5\' 3"  (1.6 m)   Wt 191 lb (86.6 kg)   SpO2 97%   BMI 33.83 kg/m     Wt Readings from Last 3 Encounters:  04/18/17 191 lb (86.6 kg)  04/04/17 187 lb (84.8 kg)  03/13/17 188 lb (85.3 kg)     GEN:  Well nourished, well developed in no acute distress HEENT: Normal NECK: No JVD; No carotid bruits LYMPHATICS: No lymphadenopathy CARDIAC: RRR,  no murmurs, no rubs, no gallops RESPIRATORY:  Clear to auscultation without rales, wheezing or rhonchi  ABDOMEN: Soft, non-tender, non-distended MUSCULOSKELETAL:  No edema; No deformity  SKIN: Warm and dry NEUROLOGIC:  Alert and oriented x 3 PSYCHIATRIC:  Normal affect   ASSESSMENT:    1. Essential hypertension   2. Atypical chest pain   3. Chronic bilateral low back pain without sciatica    PLAN:    In order of problems listed above:  1. Atypical chest pain: Not related to exercise.  Partially reproducible by pressing chest wall I really suspect with dealing with costochondritis and musculoskeletal pain.  However she is so  much more about the pain that I think will be reasonable to perform stress test.  I will schedule her to have stress echocardiogram. 2. Essential hypertension: Blood pressure well controlled continue present medications. 3. Dyslipidemia.  Her HDL is high therefore I will not initiate any medications.  Obviously if his stress test will be abnormal then statin will be in order. 4. Chronic low back pain.  That being addressed by internal medicine team.  I advised him to keep going to gym especially since she does not have any pain while exercising.  However, I told her not to push herself hard until we have stress echocardiogram done.  I see her back in a month   Medication Adjustments/Labs and Tests Ordered: Current medicines are reviewed at length with the patient today.  Concerns regarding medicines are outlined above.  No orders of the defined types were placed in this encounter.  No orders of the defined types were placed in this encounter.   Signed, Park Liter, MD, [facility]. 04/18/2017 10:59 AM    [place]

## 2017-04-18 NOTE — Patient Instructions (Signed)
Medication Instructions:  Your physician recommends that you continue on your current medications as directed. Please refer to the Current Medication list given to you today.  Labwork: None ordered  Testing/Procedures: Your physician has requested that you have a stress echocardiogram. For further information please visit [email or web address]. Please follow instruction sheet as given.  Follow-Up: Your physician recommends that you schedule a follow-up appointment in: 1 months with Dr. Agustin Cree   Any Other Special Instructions Will Be Listed Below (If Applicable).     If you need a refill on your cardiac medications before your next appointment, please call your pharmacy.

## 2017-04-29 ENCOUNTER — Ambulatory Visit (INDEPENDENT_AMBULATORY_CARE_PROVIDER_SITE_OTHER): Payer: Medicare Other | Admitting: Physician Assistant

## 2017-05-08 ENCOUNTER — Ambulatory Visit (INDEPENDENT_AMBULATORY_CARE_PROVIDER_SITE_OTHER): Payer: Medicare Other | Admitting: Physician Assistant

## 2017-05-08 VITALS — BP 132/74 | HR 75 | Temp 98.3°F | Ht 63.0 in | Wt 185.0 lb

## 2017-05-08 DIAGNOSIS — R7303 Prediabetes: Secondary | ICD-10-CM

## 2017-05-08 DIAGNOSIS — E559 Vitamin D deficiency, unspecified: Secondary | ICD-10-CM | POA: Diagnosis not present

## 2017-05-08 DIAGNOSIS — Z6832 Body mass index (BMI) 32.0-32.9, adult: Secondary | ICD-10-CM | POA: Diagnosis not present

## 2017-05-08 DIAGNOSIS — Z9189 Other specified personal risk factors, not elsewhere classified: Secondary | ICD-10-CM | POA: Diagnosis not present

## 2017-05-08 DIAGNOSIS — E669 Obesity, unspecified: Secondary | ICD-10-CM

## 2017-05-08 MED ORDER — VITAMIN D3 1.25 MG (50000 UT) PO CAPS: 1.0000 | ORAL_CAPSULE | ORAL | 0 refills | Status: DC

## 2017-05-08 NOTE — Progress Notes (Addendum)
Office: [phone]  /  Fax: [phone]   HPI:   Chief Complaint: OBESITY Brandy Dunlap is here to discuss her progress with her obesity treatment plan. She is on the Category 2 plan and is following her eating plan approximately 70 % of the time. She states she is doing aerobics 45 minutes 1 time per week. Brandy Dunlap states she continues to have challenges eating the required protein and would like a different meal plan. She also requests a longer follow up time, due to work issues. Her weight is 185 lb (83.9 kg) today and has had a weight loss of 2 pounds over a period of 5 weeks since her last visit. She has lost 10 lbs since starting treatment with [name].  Vitamin D deficiency Brandy Dunlap has a diagnosis of vitamin D deficiency. She is currently taking vit D and denies nausea, vomiting or muscle weakness.   Ref. Range 04/04/2017 08:38  Vitamin D, 25-Hydroxy Latest Ref Range: 30.0 - 100.0 ng/mL 51.3   Pre-Diabetes Brandy Dunlap has a diagnosis of pre-diabetes. Her Hgb A1c increased to 6.3 and patient is not on metformin. Brandy Dunlap continues to decline metformin. Brandy Dunlap was informed this puts her at greater risk of developing diabetes. She continues to work on diet and exercise to decrease risk of diabetes. She denies nausea, polyphagia or hypoglycemia.  At risk for diabetes Brandy Dunlap is at higher than average risk for developing diabetes due to her obesity and pre-diabetes. She currently denies polyuria or polydipsia.  ALLERGIES: Allergies  Allergen Reactions  . Pantoprazole Sodium Other (See Comments)    Racing heart    MEDICATIONS: Current Outpatient Medications on File Prior to Visit  Medication Sig Dispense Refill  . amLODipine (NORVASC) 5 MG tablet TAKE 1 TABLET BY MOUTH EVERY DAY 90 tablet 1  . budesonide-formoterol (SYMBICORT) 160-4.5 MCG/ACT inhaler Inhale 2 puffs into the lungs 2 (two) times daily.    [name] dexlansoprazole (DEXILANT) 60 MG capsule Take 1 capsule (60 mg total) by mouth daily. 90 capsule 1  .  diclofenac sodium (VOLTAREN) 1 % GEL APPLY (2G) BY TOPICAL ROUTE 4 TIMES EVERY DAY TO THE AFFECTED AREA(S)  1  . EPIPEN 2-PAK 0.3 MG/0.3ML SOAJ injection   1  . montelukast (SINGULAIR) 10 MG tablet Take 10 mg by mouth every evening.  5  . Multiple Vitamin (MULTIVITAMIN) capsule Take 1 capsule by mouth daily.      . Naltrexone-Bupropion HCl ER (CONTRAVE) 8-90 MG TB12 Take 2 tablets by mouth 2 (two) times daily. 120 tablet 0  . omalizumab (XOLAIR) 150 MG injection Inject 150 mg into the skin every 14 (fourteen) days.    [name] PROAIR RESPICLICK 662 (90 BASE) MCG/ACT AEPB Inhale 2 puffs into the lungs every 6 (six) hours as needed.  0   No current facility-administered medications on file prior to visit.     PAST MEDICAL HISTORY: Past Medical History:  Diagnosis Date  . Allergy   . Asthma   . B12 deficiency   . Back pain   . Bronchitis    hx  . Colon polyp 10/06/2003  . GERD (gastroesophageal reflux disease)   . Hypertension   . Low back pain   . Osteoarthritis   . Sinus trouble     PAST SURGICAL HISTORY: Past Surgical History:  Procedure Laterality Date  . CHOLECYSTECTOMY    . COLONOSCOPY    . COLONOSCOPY W/ BIOPSIES  10/06/2003  . LUMBAR FUSION  2008  . PARTIAL HIP ARTHROPLASTY Right 2009    hip  replacement  . TONSILLECTOMY    . TOTAL KNEE ARTHROPLASTY Right 11/16/2013   Procedure: RIGHT TOTAL KNEE ARTHROPLASTY;  Surgeon: Vickey Huger, MD;  Location: [place];  Service: Orthopedics;  Laterality: Right;  [name] VIDEO BRONCHOSCOPY Bilateral 12/05/2012   Procedure: VIDEO BRONCHOSCOPY WITHOUT FLUORO;  Surgeon: Collene Gobble, MD;  Location[facility];  Service: Cardiopulmonary;  Laterality: Bilateral;    SOCIAL HISTORY: Social History   Tobacco Use  . Smoking status: Never Smoker  . Smokeless tobacco: Never Used  Substance Use Topics  . Alcohol use: No  . Drug use: No    FAMILY HISTORY: Family History  Problem Relation Age of Onset  . COPD Father        died at age 48  .  Emphysema Father   . Heart Problems Father   . Heart disease Brother        stent with 90%  . COPD Mother   . Heart disease Mother   . Ovarian cancer Mother   . Emphysema Mother   . Hypertension Mother   . Hyperlipidemia Mother   . Heart Problems Mother   . Stroke Mother   . Thyroid disease Mother   . Cancer Mother   . Breast cancer Maternal Aunt   . Colon cancer Neg Hx   . Esophageal cancer Neg Hx   . Rectal cancer Neg Hx   . Stomach cancer Neg Hx     ROS: Review of Systems  Constitutional: Positive for weight loss.  Gastrointestinal: Negative for nausea and vomiting.  Genitourinary: Negative for frequency.  Musculoskeletal:       Negative for muscle weakness  Endo/Heme/Allergies: Negative for polydipsia.       Negative for polyphagia Negative for hypoglycemia    PHYSICAL EXAM: Blood pressure 132/74, pulse 75, temperature 98.3 F (36.8 C), temperature source Oral, height 5\' 3"  (1.6 m), weight 185 lb (83.9 kg), SpO2 96 %. Body mass index is 32.77 kg/m. Physical Exam  Constitutional: She is oriented to person, place, and time. She appears well-developed and well-nourished.  Cardiovascular: Normal rate.  Pulmonary/Chest: Effort normal.  Musculoskeletal: Normal range of motion.  Neurological: She is oriented to person, place, and time.  Skin: Skin is warm and dry.  Psychiatric: She has a normal mood and affect. Her behavior is normal.  Vitals reviewed.   RECENT LABS AND TESTS: BMET    Component Value Date/Time   NA 141 04/04/2017 0838   K 4.5 04/04/2017 0838   CL 105 04/04/2017 0838   CO2 21 04/04/2017 0838   GLUCOSE 108 (H) 04/04/2017 0838   GLUCOSE 58 (L) 07/30/2016 1439   BUN 10 04/04/2017 0838   CREATININE 0.69 04/04/2017 0838   CALCIUM 9.3 04/04/2017 0838   GFRNONAA 91 04/04/2017 0838   GFRAA 105 04/04/2017 0838   Lab Results  Component Value Date   HGBA1C 6.3 (H) 04/04/2017   HGBA1C 5.7 (H) 12/03/2016   HGBA1C 6.0 (H) 07/25/2016   HGBA1C 5.8  (H) 04/17/2016   Lab Results  Component Value Date   INSULIN 15.6 04/04/2017   INSULIN 13.4 12/03/2016   INSULIN 19.7 07/25/2016   INSULIN 13.9 04/17/2016   CBC    Component Value Date/Time   WBC 9.4 07/30/2016 1439   RBC 4.40 07/30/2016 1439   HGB 12.2 07/30/2016 1439   HGB 12.7 04/17/2016 1150   HCT 37.2 07/30/2016 1439   HCT 39.5 04/17/2016 1150   PLT 283.0 07/30/2016 1439   MCV 84.4 07/30/2016 1439  MCV 84 04/17/2016 1150   MCH 27.1 04/17/2016 1150   MCH 29.3 11/18/2013 0705   MCHC 32.9 07/30/2016 1439   RDW 15.9 (H) 07/30/2016 1439   RDW 16.0 (H) 04/17/2016 1150   LYMPHSABS 3.5 07/30/2016 1439   LYMPHSABS 2.7 04/17/2016 1150   MONOABS 0.7 07/30/2016 1439   EOSABS 0.4 07/30/2016 1439   EOSABS 0.2 04/17/2016 1150   BASOSABS 0.1 07/30/2016 1439   BASOSABS 0.0 04/17/2016 1150   Iron/TIBC/Ferritin/ %Sat No results found for: IRON, TIBC, FERRITIN, IRONPCTSAT Lipid Panel     Component Value Date/Time   CHOL 204 (H) 04/04/2017 0838   TRIG 130 04/04/2017 0838   HDL 62 04/04/2017 0838   CHOLHDL 3 10/24/2015 1054   VLDL 21.6 10/24/2015 1054   LDLCALC 116 (H) 04/04/2017 0838   LDLDIRECT 149.9 09/11/2010 0908   Hepatic Function Panel     Component Value Date/Time   PROT 7.0 04/04/2017 0838   ALBUMIN 4.2 04/04/2017 0838   AST 18 04/04/2017 0838   ALT 17 04/04/2017 0838   ALKPHOS 72 04/04/2017 0838   BILITOT 0.3 04/04/2017 0838   BILIDIR 0.0 10/24/2015 1054      Component Value Date/Time   TSH 1.890 04/17/2016 1150   TSH 1.15 10/24/2015 1054   TSH 1.30 04/25/2015 1132    Ref. Range 04/04/2017 08:38  Vitamin D, 25-Hydroxy Latest Ref Range: 30.0 - 100.0 ng/mL 51.3   ASSESSMENT AND PLAN: Prediabetes  Vitamin D deficiency - Plan: Cholecalciferol (VITAMIN D3) 50000 units CAPS  At risk for diabetes mellitus  Class 1 obesity with serious comorbidity and body mass index (BMI) of 32.0 to 32.9 in adult, unspecified obesity type  PLAN:  Vitamin D  Deficiency Brandy Dunlap was informed that low vitamin D levels contributes to fatigue and are associated with obesity, breast, and colon cancer. She agrees to continue to take prescription Vit D 3 @50 ,000 IU every week #4 with no refills and will follow up for routine testing of vitamin D, at least 2-3 times per year. She was informed of the risk of over-replacement of vitamin D and agrees to not increase her dose unless she discusses this with [name] first. Lucky agrees to follow up with our clinic in 8 weeks.  Pre-Diabetes Brandy Dunlap will continue to work on weight loss, exercise, and decreasing simple carbohydrates in her diet to help decrease the risk of diabetes. We dicussed metformin including benefits and risks. She was informed that eating too many simple carbohydrates or too many calories at one sitting increases the likelihood of GI side effects. Brandy Dunlap declined metformin for now and a prescription was not written today. Brandy Dunlap agreed to follow up with [name] as directed to monitor her progress.  Diabetes risk counseling Brandy Dunlap was given extended (15 minutes) diabetes prevention counseling today. She is 67 y.o. female and has risk factors for diabetes including obesity and pre-diabetes. We discussed intensive lifestyle modifications today with an emphasis on weight loss as well as increasing exercise and decreasing simple carbohydrates in her diet.  Obesity Brandy Dunlap is currently in the action stage of change. As such, her goal is to continue with weight loss efforts She has agreed to follow the Pescatarian eating plan Brandy Dunlap has been instructed to work up to a goal of 150 minutes of combined cardio and strengthening exercise per week for weight loss and overall health benefits. We discussed the following Behavioral Modification Strategies today: increasing lean protein intake and planning for success We discussed various medication options to help Brandy Dunlap  with her weight loss efforts and we both agreed to continue Contrave 8-90  mg 2 pills po bid #120 with no refills  Brandy Dunlap has agreed to follow up with our clinic in 8 weeks. She was informed of the importance of frequent follow up visits to maximize her success with intensive lifestyle modifications for her multiple health conditions.    OBESITY BEHAVIORAL INTERVENTION VISIT  Today's visit was # 18 out of 22.  Starting weight: 195 lbs Starting date: 04/17/16 Today's weight : 185 lbs Today's date: 05/08/2017 Total lbs lost to date: 10 (Patients must lose 7 lbs in the first 6 months to continue with counseling)   ASK: We discussed the diagnosis of obesity with Brandy Dunlap today and Brandy Dunlap agreed to give [name] permission to discuss obesity behavioral modification therapy today.  ASSESS: Brandy Dunlap has the diagnosis of obesity and her BMI today is 32.78 Brandy Dunlap is in the action stage of change   ADVISE: Brandy Dunlap was educated on the multiple health risks of obesity as well as the benefit of weight loss to improve her health. She was advised of the need for long term treatment and the importance of lifestyle modifications.  AGREE: Multiple dietary modification options and treatment options were discussed and  Brandy Dunlap agreed to the above obesity treatment plan.   Corey Skains, am acting as transcriptionist for [employer], PA-C I, [facility], have reviewed this note and agree with its content

## 2017-05-20 ENCOUNTER — Ambulatory Visit (HOSPITAL_BASED_OUTPATIENT_CLINIC_OR_DEPARTMENT_OTHER)
Admission: RE | Admit: 2017-05-20 | Discharge: 2017-05-20 | Disposition: A | Discharge status: Home / Self Care | Payer: Medicare Other | Source: Ambulatory Visit | Attending: Cardiology | Admitting: Cardiology

## 2017-05-20 DIAGNOSIS — G8929 Other chronic pain: Secondary | ICD-10-CM | POA: Diagnosis present

## 2017-05-20 DIAGNOSIS — Z6832 Body mass index (BMI) 32.0-32.9, adult: Secondary | ICD-10-CM | POA: Diagnosis not present

## 2017-05-20 DIAGNOSIS — M545 Low back pain: Secondary | ICD-10-CM

## 2017-05-20 DIAGNOSIS — E119 Type 2 diabetes mellitus without complications: Secondary | ICD-10-CM | POA: Diagnosis not present

## 2017-05-20 DIAGNOSIS — I10 Essential (primary) hypertension: Secondary | ICD-10-CM | POA: Diagnosis present

## 2017-05-20 DIAGNOSIS — R0789 Other chest pain: Secondary | ICD-10-CM

## 2017-05-20 DIAGNOSIS — E669 Obesity, unspecified: Secondary | ICD-10-CM | POA: Diagnosis not present

## 2017-05-20 MED ORDER — PERFLUTREN LIPID MICROSPHERE
1.0000 mL | INTRAVENOUS | Status: AC | PRN
  Administered 2017-05-20: 10 mL via INTRAVENOUS
  Filled 2017-05-20: qty 10

## 2017-05-20 NOTE — Progress Notes (Signed)
Echocardiogram Echocardiogram Stress Test with contrast has been performed.  Brandy Dunlap 05/20/2017, 11:56 AM

## 2017-06-19 ENCOUNTER — Ambulatory Visit: Payer: Medicare Other | Admitting: Cardiology

## 2017-07-01 ENCOUNTER — Encounter: Payer: Self-pay | Admitting: Cardiology

## 2017-07-01 ENCOUNTER — Ambulatory Visit: Payer: Medicare Other | Admitting: Cardiology

## 2017-07-01 VITALS — BP 140/60 | HR 70 | Ht 63.0 in | Wt 189.4 lb

## 2017-07-01 DIAGNOSIS — R0789 Other chest pain: Secondary | ICD-10-CM

## 2017-07-01 DIAGNOSIS — R7303 Prediabetes: Secondary | ICD-10-CM | POA: Diagnosis not present

## 2017-07-01 DIAGNOSIS — I10 Essential (primary) hypertension: Secondary | ICD-10-CM

## 2017-07-01 NOTE — Progress Notes (Signed)
Cardiology Office Note:    Date:  07/01/2017   ID:  Brandy Dunlap, DOB 1950/06/09, MRN [id number]  PCP:  [name], NP  Cardiologist:  Jenne Campus, MD    Referring MD: [name], NP   Chief Complaint  Patient presents with  . Follow-up  Doing well but still have chest pain  History of Present Illness:    Brandy Dunlap is a 67 y.o. female with atypical chest pain.  Pain is not related to exercise can last for hours reproducible by pressing chest wall.  We did stress test to make sure there is no inducible ischemia and likely stress test came normal we spent great of time talking about risk factors modification for coronary artery disease and I still encouraged her to exercise on the regular basis.  We talked about taking some nonsteroidal anti-inflammatory which seems to be helping with the pain that she is experiencing.  We will also talk about potentially using Kinesiotape.  Past Medical History:  Diagnosis Date  . Allergy   . Asthma   . B12 deficiency   . Back pain   . Bronchitis    hx  . Colon polyp 10/06/2003  . GERD (gastroesophageal reflux disease)   . Hypertension   . Low back pain   . Osteoarthritis   . Sinus trouble     Past Surgical History:  Procedure Laterality Date  . CHOLECYSTECTOMY    . COLONOSCOPY    . COLONOSCOPY W/ BIOPSIES  10/06/2003  . LUMBAR FUSION  2008  . PARTIAL HIP ARTHROPLASTY Right 2009    hip replacement  . TONSILLECTOMY    . TOTAL KNEE ARTHROPLASTY Right 11/16/2013   Procedure: RIGHT TOTAL KNEE ARTHROPLASTY;  Surgeon: Vickey Huger, MD;  Location: [place];  Service: Orthopedics;  Laterality: Right;  [name] VIDEO BRONCHOSCOPY Bilateral 12/05/2012   Procedure: VIDEO BRONCHOSCOPY WITHOUT FLUORO;  Surgeon: Collene Gobble, MD;  Location: [facility];  Service: Cardiopulmonary;  Laterality: Bilateral;    Current Medications: Current Meds  Medication Sig  . amLODipine (NORVASC) 5 MG tablet TAKE 1 TABLET BY MOUTH EVERY DAY  .  budesonide-formoterol (SYMBICORT) 160-4.5 MCG/ACT inhaler Inhale 2 puffs into the lungs 2 (two) times daily.  . Cholecalciferol (VITAMIN D3) 50000 units CAPS Take 1 Dose by mouth once a week.  [name] dexlansoprazole (DEXILANT) 60 MG capsule Take 1 capsule (60 mg total) by mouth daily.  . diclofenac sodium (VOLTAREN) 1 % GEL APPLY (2G) BY TOPICAL ROUTE 4 TIMES EVERY DAY TO THE AFFECTED AREA(S)  . EPIPEN 2-PAK 0.3 MG/0.3ML SOAJ injection   . montelukast (SINGULAIR) 10 MG tablet Take 10 mg by mouth every evening.  . Multiple Vitamin (MULTIVITAMIN) capsule Take 1 capsule by mouth daily.    . Naltrexone-Bupropion HCl ER (CONTRAVE) 8-90 MG TB12 Take 2 tablets by mouth 2 (two) times daily.  [name] omalizumab (XOLAIR) 150 MG injection Inject 150 mg into the skin every 14 (fourteen) days.  [name] PROAIR RESPICLICK 573 (90 BASE) MCG/ACT AEPB Inhale 2 puffs into the lungs every 6 (six) hours as needed.     Allergies:   Pantoprazole sodium   Social History   Socioeconomic History  . Marital status: Single    Spouse name: Not on file  . Number of children: 1  . Years of education: Not on file  . Highest education level: Not on file  Occupational History  . Occupation: Product manager: [employer]  Social Needs  . Financial  resource strain: Not on file  . Food insecurity:    Worry: Not on file    Inability: Not on file  . Transportation needs:    Medical: Not on file    Non-medical: Not on file  Tobacco Use  . Smoking status: Never Smoker  . Smokeless tobacco: Never Used  Substance and Sexual Activity  . Alcohol use: No  . Drug use: No  . Sexual activity: Yes    Partners: Male    Birth control/protection: Post-menopausal  Lifestyle  . Physical activity:    Days per week: Not on file    Minutes per session: Not on file  . Stress: Not on file  Relationships  . Social connections:    Talks on phone: Not on file    Gets together: Not on file    Attends religious service: Not on  file    Active member of club or organization: Not on file    Attends meetings of clubs or organizations: Not on file    Relationship status: Not on file  Other Topics Concern  . Not on file  Social History Narrative   Divorced   One daughter- lives locally and one grandson   Retired Pharmacist, hospital,  Has masters degree   Enjoys reading, spending time with her grandson     Family History: The patient's family history includes Breast cancer in her maternal aunt; COPD in her father and mother; Cancer in her mother; Emphysema in her father and mother; Heart Problems in her father and mother; Heart disease in her brother and mother; Hyperlipidemia in her mother; Hypertension in her mother; Ovarian cancer in her mother; Stroke in her mother; Thyroid disease in her mother. There is no history of Colon cancer, Esophageal cancer, Rectal cancer, or Stomach cancer. ROS:   Please see the history of present illness.    All 14 point review of systems negative except as described per history of present illness  EKGs/Labs/Other Studies Reviewed:      Recent Labs: 07/30/2016: Hemoglobin 12.2; Platelets 283.0 04/04/2017: ALT 17; BUN 10; Creatinine, Ser 0.69; Potassium 4.5; Sodium 141  Recent Lipid Panel    Component Value Date/Time   CHOL 204 (H) 04/04/2017 0838   TRIG 130 04/04/2017 0838   HDL 62 04/04/2017 0838   CHOLHDL 3 10/24/2015 1054   VLDL 21.6 10/24/2015 1054   LDLCALC 116 (H) 04/04/2017 0838   LDLDIRECT 149.9 09/11/2010 0908    Physical Exam:    VS:  BP 140/60   Pulse 70   Ht 5\' 3"  (1.6 m)   Wt 189 lb 6.4 oz (85.9 kg)   SpO2 97%   BMI 33.55 kg/m     Wt Readings from Last 3 Encounters:  07/01/17 189 lb 6.4 oz (85.9 kg)  05/08/17 185 lb (83.9 kg)  04/18/17 191 lb (86.6 kg)     GEN:  Well nourished, well developed in no acute distress HEENT: Normal NECK: No JVD; No carotid bruits LYMPHATICS: No lymphadenopathy CARDIAC: RRR, no murmurs, no rubs, no gallops RESPIRATORY:  Clear to  auscultation without rales, wheezing or rhonchi  ABDOMEN: Soft, non-tender, non-distended MUSCULOSKELETAL:  No edema; No deformity  SKIN: Warm and dry LOWER EXTREMITIES: no swelling NEUROLOGIC:  Alert and oriented x 3 PSYCHIATRIC:  Normal affect   ASSESSMENT:    1. Atypical chest pain   2. Essential hypertension   3. Prediabetes    PLAN:    In order of problems listed above:  1. Atypical chest pain:  Stress test negative. 2. Essential hypertension blood pressure appears to be well controlled. 3. Prediabetes: Encouraged her to exercise on the regular basis watch diet  See her back in 6 months or sooner if she has a problem   Medication Adjustments/Labs and Tests Ordered: Current medicines are reviewed at length with the patient today.  Concerns regarding medicines are outlined above.  No orders of the defined types were placed in this encounter.  Medication changes: No orders of the defined types were placed in this encounter.   Signed, Park Liter, MD, [facility] 07/01/2017 10:20 AM    [place]

## 2017-07-01 NOTE — Patient Instructions (Signed)

## 2017-07-03 ENCOUNTER — Ambulatory Visit (INDEPENDENT_AMBULATORY_CARE_PROVIDER_SITE_OTHER): Payer: Medicare Other | Admitting: Physician Assistant

## 2017-07-29 ENCOUNTER — Ambulatory Visit (INDEPENDENT_AMBULATORY_CARE_PROVIDER_SITE_OTHER): Payer: Medicare Other | Admitting: Family Medicine

## 2017-07-29 ENCOUNTER — Encounter (INDEPENDENT_AMBULATORY_CARE_PROVIDER_SITE_OTHER): Payer: Self-pay

## 2017-07-29 ENCOUNTER — Other Ambulatory Visit: Payer: Self-pay | Admitting: Family

## 2017-08-12 ENCOUNTER — Other Ambulatory Visit: Payer: Self-pay | Admitting: Family

## 2017-08-14 ENCOUNTER — Ambulatory Visit: Payer: Medicare Other | Admitting: Family

## 2017-08-14 ENCOUNTER — Ambulatory Visit (HOSPITAL_BASED_OUTPATIENT_CLINIC_OR_DEPARTMENT_OTHER)
Admission: RE | Admit: 2017-08-14 | Discharge: 2017-08-14 | Disposition: A | Discharge status: Home / Self Care | Payer: Medicare Other | Source: Ambulatory Visit | Attending: Family | Admitting: Family

## 2017-08-14 ENCOUNTER — Encounter: Payer: Self-pay | Admitting: Family

## 2017-08-14 VITALS — BP 142/70 | HR 76 | Temp 98.2°F | Resp 16 | Ht 63.0 in | Wt 190.0 lb

## 2017-08-14 DIAGNOSIS — R109 Unspecified abdominal pain: Secondary | ICD-10-CM

## 2017-08-14 DIAGNOSIS — M5136 Other intervertebral disc degeneration, lumbar region: Secondary | ICD-10-CM | POA: Insufficient documentation

## 2017-08-14 DIAGNOSIS — K5730 Diverticulosis of large intestine without perforation or abscess without bleeding: Secondary | ICD-10-CM | POA: Insufficient documentation

## 2017-08-14 DIAGNOSIS — N12 Tubulo-interstitial nephritis, not specified as acute or chronic: Secondary | ICD-10-CM

## 2017-08-14 DIAGNOSIS — M545 Low back pain, unspecified: Secondary | ICD-10-CM

## 2017-08-14 LAB — BASIC METABOLIC PANEL
BUN: 17 mg/dL (ref 6–23)
CO2: 27 mEq/L (ref 19–32)
Calcium: 9.6 mg/dL (ref 8.4–10.5)
Chloride: 104 mEq/L (ref 96–112)
Creatinine, Ser: 0.68 mg/dL (ref 0.40–1.20)
GFR: 91.79 mL/min (ref >60.00)
Glucose, Bld: 84 mg/dL (ref 70–99)
Potassium: 4.4 mEq/L (ref 3.5–5.1)
Sodium: 140 mEq/L (ref 135–145)

## 2017-08-14 LAB — CBC WITH DIFFERENTIAL/PLATELET
Basophils Absolute: 0.1 10*3/uL (ref 0.0–0.1)
Basophils Relative: 1 % (ref 0.0–3.0)
Eosinophils Absolute: 0.5 10*3/uL (ref 0.0–0.7)
Eosinophils Relative: 5.2 % — ABNORMAL HIGH (ref 0.0–5.0)
HCT: 38.1 % (ref 36.0–46.0)
Hemoglobin: 12.5 g/dL (ref 12.0–15.0)
Lymphocytes Relative: 34.5 % (ref 12.0–46.0)
Lymphs Abs: 3.1 10*3/uL (ref 0.7–4.0)
MCHC: 33 g/dL (ref 30.0–36.0)
MCV: 83.8 fl (ref 78.0–100.0)
Monocytes Absolute: 0.5 10*3/uL (ref 0.1–1.0)
Monocytes Relative: 6.1 % (ref 3.0–12.0)
Neutro Abs: 4.7 10*3/uL (ref 1.4–7.7)
Neutrophils Relative %: 53.2 % (ref 43.0–77.0)
Platelets: 309 10*3/uL (ref 150.0–400.0)
RBC: 4.54 Mil/uL (ref 3.87–5.11)
RDW: 16.3 % — ABNORMAL HIGH (ref 11.5–15.5)
WBC: 8.9 10*3/uL (ref 4.0–10.5)

## 2017-08-14 LAB — POC URINALSYSI DIPSTICK (AUTOMATED)
Bilirubin, UA: NEGATIVE
Blood, UA: NEGATIVE
Glucose, UA: NEGATIVE
Ketones, UA: NEGATIVE
Leukocytes, UA: NEGATIVE
Nitrite, UA: NEGATIVE
Protein, UA: NEGATIVE
Spec Grav, UA: 1.025 (ref 1.010–1.025)
Urobilinogen, UA: 0.2 E.U./dL
pH, UA: 6 (ref 5.0–8.0)

## 2017-08-14 MED ORDER — CIPROFLOXACIN HCL 500 MG PO TABS: 500.0000 mg | ORAL_TABLET | Freq: Two times a day (BID) | ORAL | 0 refills | Status: DC

## 2017-08-14 NOTE — Progress Notes (Signed)
Subjective:    Patient ID: Brandy Dunlap, female    DOB: 1950-03-30, 67 y.o.   MRN: [id number]  HPI  67 yr old female with c/o cough x 2 weeks.  Reports that she has had thick/clear sputum.   Also has c/o right sided low back pain which stared on Sunday. Worsened on Monday. Reports that she has bladder pressure and "warm" when she urinates.  Reports that she had a low grade fever 99.1 for a few days.    Review of Systems See HPI  Past Medical History:  Diagnosis Date  . Allergy   . Asthma   . B12 deficiency   . Back pain   . Bronchitis    hx  . Colon polyp 10/06/2003  . GERD (gastroesophageal reflux disease)   . Hypertension   . Low back pain   . Osteoarthritis   . Sinus trouble      Social History   Socioeconomic History  . Marital status: Single    Spouse name: Not on file  . Number of children: 1  . Years of education: Not on file  . Highest education level: Not on file  Occupational History  . Occupation: Product manager: [employer]  Social Needs  . Financial resource strain: Not on file  . Food insecurity:    Worry: Not on file    Inability: Not on file  . Transportation needs:    Medical: Not on file    Non-medical: Not on file  Tobacco Use  . Smoking status: Never Smoker  . Smokeless tobacco: Never Used  Substance and Sexual Activity  . Alcohol use: No  . Drug use: No  . Sexual activity: Yes    Partners: Male    Birth control/protection: Post-menopausal  Lifestyle  . Physical activity:    Days per week: Not on file    Minutes per session: Not on file  . Stress: Not on file  Relationships  . Social connections:    Talks on phone: Not on file    Gets together: Not on file    Attends religious service: Not on file    Active member of club or organization: Not on file    Attends meetings of clubs or organizations: Not on file    Relationship status: Not on file  . Intimate partner violence:    Fear of current or ex partner:  Not on file    Emotionally abused: Not on file    Physically abused: Not on file    Forced sexual activity: Not on file  Other Topics Concern  . Not on file  Social History Narrative   Divorced   One daughter- lives locally and one grandson   Retired Pharmacist, hospital,  Has masters degree   Enjoys reading, spending time with her grandson    Past Surgical History:  Procedure Laterality Date  . CHOLECYSTECTOMY    . COLONOSCOPY    . COLONOSCOPY W/ BIOPSIES  10/06/2003  . LUMBAR FUSION  2008  . PARTIAL HIP ARTHROPLASTY Right 2009    hip replacement  . TONSILLECTOMY    . TOTAL KNEE ARTHROPLASTY Right 11/16/2013   Procedure: RIGHT TOTAL KNEE ARTHROPLASTY;  Surgeon: Vickey Huger, MD;  Location: [place];  Service: Orthopedics;  Laterality: Right;  [name] VIDEO BRONCHOSCOPY Bilateral 12/05/2012   Procedure: VIDEO BRONCHOSCOPY WITHOUT FLUORO;  Surgeon: Collene Gobble, MD;  Location: [facility];  Service: Cardiopulmonary;  Laterality: Bilateral;    Family  History  Problem Relation Age of Onset  . COPD Father        died at age 62  . Emphysema Father   . Heart Problems Father   . Heart disease Brother        stent with 90%  . COPD Mother   . Heart disease Mother   . Ovarian cancer Mother   . Emphysema Mother   . Hypertension Mother   . Hyperlipidemia Mother   . Heart Problems Mother   . Stroke Mother   . Thyroid disease Mother   . Cancer Mother   . Breast cancer Maternal Aunt   . Colon cancer Neg Hx   . Esophageal cancer Neg Hx   . Rectal cancer Neg Hx   . Stomach cancer Neg Hx     Allergies  Allergen Reactions  . Pantoprazole Sodium Other (See Comments)    Racing heart    Current Outpatient Medications on File Prior to Visit  Medication Sig Dispense Refill  . amLODipine (NORVASC) 5 MG tablet TAKE 1 TABLET BY MOUTH EVERY DAY 90 tablet 1  . budesonide-formoterol (SYMBICORT) 160-4.5 MCG/ACT inhaler Inhale 2 puffs into the lungs 2 (two) times daily.    . Cholecalciferol (VITAMIN D3)  50000 units CAPS Take 1 Dose by mouth once a week. 4 capsule 0  . DEXILANT 60 MG capsule TAKE 1 CAPSULE BY MOUTH EVERY DAY 90 capsule 1  . diclofenac sodium (VOLTAREN) 1 % GEL APPLY (2G) BY TOPICAL ROUTE 4 TIMES EVERY DAY TO THE AFFECTED AREA(S)  1  . EPIPEN 2-PAK 0.3 MG/0.3ML SOAJ injection   1  . montelukast (SINGULAIR) 10 MG tablet Take 10 mg by mouth every evening.  5  . Multiple Vitamin (MULTIVITAMIN) capsule Take 1 capsule by mouth daily.      . Naltrexone-Bupropion HCl ER (CONTRAVE) 8-90 MG TB12 Take 2 tablets by mouth 2 (two) times daily. 120 tablet 0  . omalizumab (XOLAIR) 150 MG injection Inject 150 mg into the skin every 14 (fourteen) days.    [name] PROAIR RESPICLICK 952 (90 BASE) MCG/ACT AEPB Inhale 2 puffs into the lungs every 6 (six) hours as needed.  0   No current facility-administered medications on file prior to visit.     BP (!) 142/70 (BP Location: Right Arm, Cuff Size: Normal)   Pulse 76   Temp 98.2 F (36.8 C) (Oral)   Resp 16   Ht 5\' 3"  (1.6 m)   Wt 190 lb (86.2 kg)   SpO2 98%   BMI 33.66 kg/m       Objective:   Physical Exam  Constitutional: She appears well-developed and well-nourished.  HENT:  Head: Normocephalic and atraumatic.  Cardiovascular: Normal rate, regular rhythm and normal heart sounds.  No murmur heard. Pulmonary/Chest: Effort normal and breath sounds normal. No respiratory distress. She has no wheezes.  Abdominal: Soft. She exhibits no distension. There is no tenderness.  + CVAT on right. Neg on L  Psychiatric: She has a normal mood and affect. Her behavior is normal. Judgment and thought content normal.          Assessment & Plan:  Flank pain- UA is unremarkable but she has symptoms of UTI and + CVAT. Urine will be sent for culture.   Will begin empiric rx with cipro. A CT is obtained which is negative for stone.  She is advised to call if new/worsening symptoms or if symptoms are not improved in 3 days.  Her lungs are clear.  Could have  some allergies causing her congestion.  Monitor.

## 2017-08-14 NOTE — Patient Instructions (Signed)
Please begin antibiotic. Complete lab work prior to leaving. Complete CT on the first floor. Call if new/worseningy symptoms or if symptoms are not improved in 2-3 days.

## 2017-08-16 LAB — URINE CULTURE
MICRO NUMBER:: 90734390
SPECIMEN QUALITY:: ADEQUATE

## 2017-08-19 ENCOUNTER — Ambulatory Visit (INDEPENDENT_AMBULATORY_CARE_PROVIDER_SITE_OTHER): Payer: Medicare Other | Admitting: Family Medicine

## 2017-08-19 VITALS — BP 124/75 | HR 76 | Temp 98.3°F | Ht 63.0 in | Wt 188.0 lb

## 2017-08-19 DIAGNOSIS — E669 Obesity, unspecified: Secondary | ICD-10-CM

## 2017-08-19 DIAGNOSIS — Z6833 Body mass index (BMI) 33.0-33.9, adult: Secondary | ICD-10-CM

## 2017-08-19 DIAGNOSIS — E559 Vitamin D deficiency, unspecified: Secondary | ICD-10-CM

## 2017-08-19 DIAGNOSIS — N200 Calculus of kidney: Secondary | ICD-10-CM | POA: Diagnosis not present

## 2017-08-19 MED ORDER — VITAMIN D3 1.25 MG (50000 UT) PO CAPS: 1.0000 | ORAL_CAPSULE | ORAL | 0 refills | Status: DC

## 2017-08-19 NOTE — Progress Notes (Signed)
Office: [phone]  /  Fax: [phone]   HPI:   Chief Complaint: OBESITY Brandy Dunlap is here to discuss her progress with her obesity treatment plan. Brandy Dunlap is on the Pescatarian eating plan and is following her eating plan approximately 50 % of the time. Brandy Dunlap states Brandy Dunlap is exercising 0 minutes 0 times per week. Brandy Dunlap has been off track in the last 2 to 3 months, is struggling to meet her protein goal as Brandy Dunlap doesn't like to eat meat.  Her weight is 188 lb (85.3 kg) today and has gained 3 pounds since her last visit. Brandy Dunlap has lost 7 lbs since starting treatment with [name].  Vitamin D Deficiency Brandy Dunlap has a diagnosis of vitamin D deficiency. Last level at goal, Brandy Dunlap denies nausea, vomiting or muscle weakness. Brandy Dunlap states Brandy Dunlap does better on Vit D3 versus Vit D2.   Nephrolithiasis Brandy Dunlap passed a kidney stone last week and has questions about nutrition to help avoid another stone.  ALLERGIES: Allergies  Allergen Reactions  . Pantoprazole Sodium Other (See Comments)    Racing heart    MEDICATIONS: Current Outpatient Medications on File Prior to Visit  Medication Sig Dispense Refill  . amLODipine (NORVASC) 5 MG tablet TAKE 1 TABLET BY MOUTH EVERY DAY 90 tablet 1  . budesonide-formoterol (SYMBICORT) 160-4.5 MCG/ACT inhaler Inhale 2 puffs into the lungs 2 (two) times daily.    . ciprofloxacin (CIPRO) 500 MG tablet Take 1 tablet (500 mg total) by mouth 2 (two) times daily. 14 tablet 0  . DEXILANT 60 MG capsule TAKE 1 CAPSULE BY MOUTH EVERY DAY 90 capsule 1  . diclofenac sodium (VOLTAREN) 1 % GEL APPLY (2G) BY TOPICAL ROUTE 4 TIMES EVERY DAY TO THE AFFECTED AREA(S)  1  . EPIPEN 2-PAK 0.3 MG/0.3ML SOAJ injection   1  . montelukast (SINGULAIR) 10 MG tablet Take 10 mg by mouth every evening.  5  . Multiple Vitamin (MULTIVITAMIN) capsule Take 1 capsule by mouth daily.      . Naltrexone-Bupropion HCl ER (CONTRAVE) 8-90 MG TB12 Take 2 tablets by mouth 2 (two) times daily. 120 tablet 0  . omalizumab (XOLAIR)  150 MG injection Inject 150 mg into the skin every 14 (fourteen) days.    [name] PROAIR RESPICLICK 536 (90 BASE) MCG/ACT AEPB Inhale 2 puffs into the lungs every 6 (six) hours as needed.  0   No current facility-administered medications on file prior to visit.     PAST MEDICAL HISTORY: Past Medical History:  Diagnosis Date  . Allergy   . Asthma   . B12 deficiency   . Back pain   . Bronchitis    hx  . Colon polyp 10/06/2003  . GERD (gastroesophageal reflux disease)   . Hypertension   . Low back pain   . Osteoarthritis   . Sinus trouble     PAST SURGICAL HISTORY: Past Surgical History:  Procedure Laterality Date  . CHOLECYSTECTOMY    . COLONOSCOPY    . COLONOSCOPY W/ BIOPSIES  10/06/2003  . LUMBAR FUSION  2008  . PARTIAL HIP ARTHROPLASTY Right 2009    hip replacement  . TONSILLECTOMY    . TOTAL KNEE ARTHROPLASTY Right 11/16/2013   Procedure: RIGHT TOTAL KNEE ARTHROPLASTY;  Surgeon: Vickey Huger, MD;  Location: [place];  Service: Orthopedics;  Laterality: Right;  [name] VIDEO BRONCHOSCOPY Bilateral 12/05/2012   Procedure: VIDEO BRONCHOSCOPY WITHOUT FLUORO;  Surgeon: Collene Gobble, MD;  Location[facility];  Service: Cardiopulmonary;  Laterality: Bilateral;    SOCIAL  HISTORY: Social History   Tobacco Use  . Smoking status: Never Smoker  . Smokeless tobacco: Never Used  Substance Use Topics  . Alcohol use: No  . Drug use: No    FAMILY HISTORY: Family History  Problem Relation Age of Onset  . COPD Father        died at age 75  . Emphysema Father   . Heart Problems Father   . Heart disease Brother        stent with 90%  . COPD Mother   . Heart disease Mother   . Ovarian cancer Mother   . Emphysema Mother   . Hypertension Mother   . Hyperlipidemia Mother   . Heart Problems Mother   . Stroke Mother   . Thyroid disease Mother   . Cancer Mother   . Breast cancer Maternal Aunt   . Colon cancer Neg Hx   . Esophageal cancer Neg Hx   . Rectal cancer Neg Hx   . Stomach  cancer Neg Hx     ROS: Review of Systems  Constitutional: Negative for weight loss.  Gastrointestinal: Negative for nausea and vomiting.  Musculoskeletal:       Negative muscle weakness    PHYSICAL EXAM: Blood pressure 124/75, pulse 76, temperature 98.3 F (36.8 C), temperature source Oral, height 5\' 3"  (1.6 m), weight 188 lb (85.3 kg), SpO2 97 %. Body mass index is 33.3 kg/m. Physical Exam  Constitutional: Brandy Dunlap is oriented to person, place, and time. Brandy Dunlap appears well-developed and well-nourished.  Cardiovascular: Normal rate.  Pulmonary/Chest: Effort normal.  Musculoskeletal: Normal range of motion.  Neurological: Brandy Dunlap is oriented to person, place, and time.  Skin: Skin is warm and dry.  Psychiatric: Brandy Dunlap has a normal mood and affect. Her behavior is normal.  Vitals reviewed.   RECENT LABS AND TESTS: BMET    Component Value Date/Time   NA 140 08/14/2017 1324   NA 141 04/04/2017 0838   K 4.4 08/14/2017 1324   CL 104 08/14/2017 1324   CO2 27 08/14/2017 1324   GLUCOSE 84 08/14/2017 1324   BUN 17 08/14/2017 1324   BUN 10 04/04/2017 0838   CREATININE 0.68 08/14/2017 1324   CALCIUM 9.6 08/14/2017 1324   GFRNONAA 91 04/04/2017 0838   GFRAA 105 04/04/2017 0838   Lab Results  Component Value Date   HGBA1C 6.3 (H) 04/04/2017   HGBA1C 5.7 (H) 12/03/2016   HGBA1C 6.0 (H) 07/25/2016   HGBA1C 5.8 (H) 04/17/2016   Lab Results  Component Value Date   INSULIN 15.6 04/04/2017   INSULIN 13.4 12/03/2016   INSULIN 19.7 07/25/2016   INSULIN 13.9 04/17/2016   CBC    Component Value Date/Time   WBC 8.9 08/14/2017 1324   RBC 4.54 08/14/2017 1324   HGB 12.5 08/14/2017 1324   HGB 12.7 04/17/2016 1150   HCT 38.1 08/14/2017 1324   HCT 39.5 04/17/2016 1150   PLT 309.0 08/14/2017 1324   MCV 83.8 08/14/2017 1324   MCV 84 04/17/2016 1150   MCH 27.1 04/17/2016 1150   MCH 29.3 11/18/2013 0705   MCHC 33.0 08/14/2017 1324   RDW 16.3 (H) 08/14/2017 1324   RDW 16.0 (H) 04/17/2016  1150   LYMPHSABS 3.1 08/14/2017 1324   LYMPHSABS 2.7 04/17/2016 1150   MONOABS 0.5 08/14/2017 1324   EOSABS 0.5 08/14/2017 1324   EOSABS 0.2 04/17/2016 1150   BASOSABS 0.1 08/14/2017 1324   BASOSABS 0.0 04/17/2016 1150   Iron/TIBC/Ferritin/ %Sat No results found for:  IRON, TIBC, FERRITIN, IRONPCTSAT Lipid Panel     Component Value Date/Time   CHOL 204 (H) 04/04/2017 0838   TRIG 130 04/04/2017 0838   HDL 62 04/04/2017 0838   CHOLHDL 3 10/24/2015 1054   VLDL 21.6 10/24/2015 1054   LDLCALC 116 (H) 04/04/2017 0838   LDLDIRECT 149.9 09/11/2010 0908   Hepatic Function Panel     Component Value Date/Time   PROT 7.0 04/04/2017 0838   ALBUMIN 4.2 04/04/2017 0838   AST 18 04/04/2017 0838   ALT 17 04/04/2017 0838   ALKPHOS 72 04/04/2017 0838   BILITOT 0.3 04/04/2017 0838   BILIDIR 0.0 10/24/2015 1054      Component Value Date/Time   TSH 1.890 04/17/2016 1150   TSH 1.15 10/24/2015 1054   TSH 1.30 04/25/2015 1132  Results for Manolis, Telicia G "[facility] [place]" (MRN [id number]) as of 08/19/2017 11:59  Ref. Range 04/04/2017 08:38  Vitamin D, 25-Hydroxy Latest Ref Range: 30.0 - 100.0 ng/mL 51.3    ASSESSMENT AND PLAN: Vitamin D deficiency - Plan: Cholecalciferol (VITAMIN D3) 50000 units CAPS  Nephrolithiasis  Class 1 obesity with serious comorbidity and body mass index (BMI) of 33.0 to 33.9 in adult, unspecified obesity type  PLAN:  Vitamin D Deficiency Brandy Dunlap was informed that low vitamin D levels contributes to fatigue and are associated with obesity, breast, and colon cancer. Anie agrees to continue taking prescription Vit D3 @50 ,000 IU every week #4 and we will refill for 1 month. Brandy Dunlap will follow up for routine testing of vitamin D, at least 2-3 times per year. Brandy Dunlap was informed of the risk of over-replacement of vitamin D and agrees to not increase her dose unless Brandy Dunlap discusses this with [name] first. We will recheck labs in 1 month and Brandy Dunlap agrees to follow up with our clinic in 3 to 4  weeks.  Nephrolithiasis Brandy Dunlap is to increase H20 intake and we will check Vit D at next visit, Brandy Dunlap may need to decrease Ca+ and Vit D intake. Brandy Dunlap agrees to follow up with our clinic in 3 to 4 weeks.  Obesity Brandy Dunlap is currently in the action stage of change. As such, her goal is to continue with weight loss efforts Brandy Dunlap has agreed to keep a food journal with 1200 calories and 70+ grams of protein daily Brandy Dunlap has been instructed to work up to a goal of 150 minutes of combined cardio and strengthening exercise per week for weight loss and overall health benefits. We discussed the following Behavioral Modification Strategies today: increasing lean protein intake, decreasing simple carbohydrates, work on meal planning and easy cooking plans, and increase H20 intake Brandy Dunlap was given a list of high protein foods which includes non-meat options.  Brandy Dunlap has agreed to follow up with our clinic in 3 to 4 weeks. Brandy Dunlap was informed of the importance of frequent follow up visits to maximize her success with intensive lifestyle modifications for her multiple health conditions.   OBESITY BEHAVIORAL INTERVENTION VISIT  Today's visit was # 19 out of 22.  Starting weight: 195 lbs Starting date: 04/17/16 Today's weight : 188 lbs  Today's date: 08/19/2017 Total lbs lost to date: 7 (Patients must lose 7 lbs in the first 6 months to continue with counseling)   ASK: We discussed the diagnosis of obesity with Brandy Dunlap today and Brandy Dunlap agreed to give [name] permission to discuss obesity behavioral modification therapy today.  ASSESS: Brandy Dunlap has the diagnosis of obesity and her BMI today is 33.31 Brandy Dunlap is in  the action stage of change   ADVISE: Brandy Dunlap was educated on the multiple health risks of obesity as well as the benefit of weight loss to improve her health. Brandy Dunlap was advised of the need for long term treatment and the importance of lifestyle modifications.  AGREE: Multiple dietary modification options and  treatment options were discussed and  Brandy Dunlap agreed to the above obesity treatment plan.  I, Trixie Dredge, am acting as transcriptionist for Dennard Nip, MD  I have reviewed the above documentation for accuracy and completeness, and I agree with the above. -Dennard Nip, MD

## 2017-08-28 ENCOUNTER — Ambulatory Visit (INDEPENDENT_AMBULATORY_CARE_PROVIDER_SITE_OTHER): Payer: Medicare Other

## 2017-08-28 DIAGNOSIS — Z23 Encounter for immunization: Secondary | ICD-10-CM | POA: Diagnosis not present

## 2017-09-16 ENCOUNTER — Ambulatory Visit (INDEPENDENT_AMBULATORY_CARE_PROVIDER_SITE_OTHER): Payer: Medicare Other | Admitting: Family Medicine

## 2017-09-30 ENCOUNTER — Ambulatory Visit (INDEPENDENT_AMBULATORY_CARE_PROVIDER_SITE_OTHER): Payer: Medicare Other | Admitting: Family Medicine

## 2017-10-29 ENCOUNTER — Encounter: Payer: Self-pay | Admitting: Family

## 2017-11-04 ENCOUNTER — Ambulatory Visit (INDEPENDENT_AMBULATORY_CARE_PROVIDER_SITE_OTHER): Payer: Self-pay | Admitting: Family Medicine

## 2017-11-04 ENCOUNTER — Encounter (INDEPENDENT_AMBULATORY_CARE_PROVIDER_SITE_OTHER): Payer: Self-pay

## 2017-11-13 ENCOUNTER — Encounter: Payer: Self-pay | Admitting: Family

## 2017-11-13 ENCOUNTER — Ambulatory Visit: Payer: Medicare Other | Admitting: Family

## 2017-11-13 VITALS — BP 142/78 | HR 88 | Temp 98.0°F | Resp 18 | Ht 63.0 in | Wt 198.4 lb

## 2017-11-13 DIAGNOSIS — G894 Chronic pain syndrome: Secondary | ICD-10-CM

## 2017-11-13 MED ORDER — TRAMADOL HCL 50 MG PO TABS: 50.0000 mg | ORAL_TABLET | Freq: Three times a day (TID) | ORAL | 0 refills | Status: DC | PRN

## 2017-11-13 NOTE — Patient Instructions (Signed)
Please return for a follow up in 6 months with Brandy Dunlap.

## 2017-11-13 NOTE — Progress Notes (Signed)
Subjective:    Patient ID: Brandy Dunlap, female    DOB: 1950-06-21, 67 y.o.   MRN: [id number]  HPI  Patient is a 67 yr old female who presents today with chief complaint of back pain.  Had back surgery 2008 with Dr. Arnoldo Morale. Then had MRI for her neck pain and was told spinal stenosis.   She reports that she has low back pain which radiates across her lower back.  Neck "just pops." Reports that she uses tramadol rarely.   Left hip replacement- will see Dr. Maureen Ralphs for follow up.        Review of Systems See HPI  Past Medical History:  Diagnosis Date  . Allergy   . Asthma   . B12 deficiency   . Back pain   . Bronchitis    hx  . Colon polyp 10/06/2003  . GERD (gastroesophageal reflux disease)   . Hypertension   . Low back pain   . Osteoarthritis   . Sinus trouble      Social History   Socioeconomic History  . Marital status: Single    Spouse name: Not on file  . Number of children: 1  . Years of education: Not on file  . Highest education level: Not on file  Occupational History  . Occupation: Product manager: [employer]  Social Needs  . Financial resource strain: Not on file  . Food insecurity:    Worry: Not on file    Inability: Not on file  . Transportation needs:    Medical: Not on file    Non-medical: Not on file  Tobacco Use  . Smoking status: Never Smoker  . Smokeless tobacco: Never Used  Substance and Sexual Activity  . Alcohol use: No  . Drug use: No  . Sexual activity: Yes    Partners: Male    Birth control/protection: Post-menopausal  Lifestyle  . Physical activity:    Days per week: Not on file    Minutes per session: Not on file  . Stress: Not on file  Relationships  . Social connections:    Talks on phone: Not on file    Gets together: Not on file    Attends religious service: Not on file    Active member of club or organization: Not on file    Attends meetings of clubs or organizations: Not on file   Relationship status: Not on file  . Intimate partner violence:    Fear of current or ex partner: Not on file    Emotionally abused: Not on file    Physically abused: Not on file    Forced sexual activity: Not on file  Other Topics Concern  . Not on file  Social History Narrative   Divorced   One daughter- lives locally and one grandson   Retired Pharmacist, hospital,  Has masters degree   Enjoys reading, spending time with her grandson    Past Surgical History:  Procedure Laterality Date  . CHOLECYSTECTOMY    . COLONOSCOPY    . COLONOSCOPY W/ BIOPSIES  10/06/2003  . LUMBAR FUSION  2008  . PARTIAL HIP ARTHROPLASTY Right 2009    hip replacement  . TONSILLECTOMY    . TOTAL KNEE ARTHROPLASTY Right 11/16/2013   Procedure: RIGHT TOTAL KNEE ARTHROPLASTY;  Surgeon: Vickey Huger, MD;  Location: [facility];  Service: Orthopedics;  Laterality: Right;  [name] VIDEO BRONCHOSCOPY Bilateral 12/05/2012   Procedure: VIDEO BRONCHOSCOPY WITHOUT FLUORO;  Surgeon: Rose Fillers  Lamonte Sakai, MD;  Location: [facility];  Service: Cardiopulmonary;  Laterality: Bilateral;    Family History  Problem Relation Age of Onset  . COPD Father        died at age 82  . Emphysema Father   . Heart Problems Father   . Heart disease Brother        stent with 90%  . COPD Mother   . Heart disease Mother   . Ovarian cancer Mother   . Emphysema Mother   . Hypertension Mother   . Hyperlipidemia Mother   . Heart Problems Mother   . Stroke Mother   . Thyroid disease Mother   . Cancer Mother   . Breast cancer Maternal Aunt   . Colon cancer Neg Hx   . Esophageal cancer Neg Hx   . Rectal cancer Neg Hx   . Stomach cancer Neg Hx     Allergies  Allergen Reactions  . Pantoprazole Sodium Other (See Comments)    Racing heart    Current Outpatient Medications on File Prior to Visit  Medication Sig Dispense Refill  . amLODipine (NORVASC) 5 MG tablet TAKE 1 TABLET BY MOUTH EVERY DAY 90 tablet 1  . budesonide-formoterol (SYMBICORT) 160-4.5  MCG/ACT inhaler Inhale 2 puffs into the lungs 2 (two) times daily.    . Cholecalciferol (VITAMIN D3) 50000 units CAPS Take 1 Dose by mouth once a week. 4 capsule 0  . DEXILANT 60 MG capsule TAKE 1 CAPSULE BY MOUTH EVERY DAY 90 capsule 1  . diclofenac sodium (VOLTAREN) 1 % GEL APPLY (2G) BY TOPICAL ROUTE 4 TIMES EVERY DAY TO THE AFFECTED AREA(S)  1  . EPIPEN 2-PAK 0.3 MG/0.3ML SOAJ injection   1  . montelukast (SINGULAIR) 10 MG tablet Take 10 mg by mouth every evening.  5  . Multiple Vitamin (MULTIVITAMIN) capsule Take 1 capsule by mouth daily.      [name] omalizumab (XOLAIR) 150 MG injection Inject 150 mg into the skin every 14 (fourteen) days.    [name] PROAIR RESPICLICK 846 (90 BASE) MCG/ACT AEPB Inhale 2 puffs into the lungs every 6 (six) hours as needed.  0   No current facility-administered medications on file prior to visit.     BP (!) 142/78   Pulse 88   Temp 98 F (36.7 C) (Oral)   Resp 18   Ht 5\' 3"  (1.6 m)   Wt 198 lb 6.4 oz (90 kg)   SpO2 98%   BMI 35.14 kg/m       Objective:   Physical Exam  Constitutional: She is oriented to person, place, and time. She appears well-developed and well-nourished.  Cardiovascular: Normal rate, regular rhythm and normal heart sounds.  No murmur heard. Pulmonary/Chest: Effort normal and breath sounds normal. No respiratory distress. She has no wheezes.  Musculoskeletal: She exhibits no edema.  Neurological: She is alert and oriented to person, place, and time.  Skin: Skin is warm and dry.  Psychiatric: She has a normal mood and affect. Her behavior is normal. Judgment and thought content normal.          Assessment & Plan:  Chronic pain syndrome- stable with sparing use of tramadol. rx provided for tramadol prn. Controlled substance contract is signed and UDS is collected.

## 2017-11-14 LAB — PAIN MGMT, PROFILE 8 W/CONF, U
6 Acetylmorphine: NEGATIVE ng/mL (ref <10)
Alcohol Metabolites: NEGATIVE ng/mL (ref <500)
Amphetamines: NEGATIVE ng/mL (ref <500)
Benzodiazepines: NEGATIVE ng/mL (ref <100)
Buprenorphine, Urine: NEGATIVE ng/mL (ref <5)
Cocaine Metabolite: NEGATIVE ng/mL (ref <150)
Creatinine: 57.4 mg/dL
MDMA: NEGATIVE ng/mL (ref <500)
Marijuana Metabolite: NEGATIVE ng/mL (ref <20)
Opiates: NEGATIVE ng/mL (ref <100)
Oxidant: NEGATIVE ug/mL (ref <200)
Oxycodone: NEGATIVE ng/mL (ref <100)
pH: 6.26 (ref 4.5–9.0)

## 2017-11-20 ENCOUNTER — Ambulatory Visit: Payer: Medicare Other | Admitting: Family

## 2017-12-02 ENCOUNTER — Ambulatory Visit
Admission: RE | Admit: 2017-12-02 | Discharge: 2017-12-02 | Disposition: A | Discharge status: Home / Self Care | Payer: Medicare Other | Source: Ambulatory Visit | Attending: Allergy and Immunology | Admitting: Allergy and Immunology

## 2017-12-02 ENCOUNTER — Other Ambulatory Visit: Payer: Self-pay | Admitting: Allergy and Immunology

## 2017-12-02 DIAGNOSIS — R05 Cough: Secondary | ICD-10-CM

## 2017-12-02 DIAGNOSIS — R059 Cough, unspecified: Secondary | ICD-10-CM

## 2017-12-05 ENCOUNTER — Telehealth: Payer: Self-pay | Admitting: *Deleted

## 2017-12-05 NOTE — Telephone Encounter (Signed)
Received Chest X-ray results from [facility]; forwarded to provider/SLS 10/10

## 2017-12-31 DIAGNOSIS — M25552 Pain in left hip: Secondary | ICD-10-CM | POA: Insufficient documentation

## 2018-01-13 ENCOUNTER — Other Ambulatory Visit: Payer: Self-pay | Admitting: Family

## 2018-01-14 NOTE — Telephone Encounter (Signed)
Last tramadol RX: 11/13/17, #30 Last OV: 11/13/17 Next OV: due 05/14/18 UDS: 11/13/17 CSC: 11/13/17 CSR: No discrepancies identified

## 2018-01-25 ENCOUNTER — Other Ambulatory Visit: Payer: Self-pay | Admitting: Family

## 2018-01-28 ENCOUNTER — Ambulatory Visit: Payer: Medicare Other | Admitting: Medical

## 2018-01-28 ENCOUNTER — Encounter: Payer: Self-pay | Admitting: Medical

## 2018-01-28 VITALS — BP 133/75 | HR 69 | Temp 98.6°F | Resp 16 | Ht 63.0 in | Wt 197.6 lb

## 2018-01-28 DIAGNOSIS — R0981 Nasal congestion: Secondary | ICD-10-CM

## 2018-01-28 DIAGNOSIS — J40 Bronchitis, not specified as acute or chronic: Secondary | ICD-10-CM | POA: Diagnosis not present

## 2018-01-28 DIAGNOSIS — M791 Myalgia, unspecified site: Secondary | ICD-10-CM | POA: Diagnosis not present

## 2018-01-28 DIAGNOSIS — J029 Acute pharyngitis, unspecified: Secondary | ICD-10-CM | POA: Diagnosis not present

## 2018-01-28 MED ORDER — HYDROCODONE-HOMATROPINE 5-1.5 MG/5ML PO SYRP: 5.0000 mL | ORAL_SOLUTION | Freq: Four times a day (QID) | ORAL | 0 refills | Status: DC | PRN

## 2018-01-28 MED ORDER — AZITHROMYCIN 250 MG PO TABS: ORAL_TABLET | ORAL | 0 refills | Status: DC

## 2018-01-28 MED ORDER — PREDNISONE 10 MG PO TABS: ORAL_TABLET | ORAL | 0 refills | Status: DC

## 2018-01-28 NOTE — Patient Instructions (Addendum)
You appear to have bronchitis. Rest hydrate and tylenol for fever. I am prescribing cough medicine  hycodan, and azithromycin antibiotic. For your nasal congestion use you flonase.  For wheezing use you proair and continue singulair. Holding xolair presently. If wheezing worsens then start print tx of 5 day taper prednisone.  When better from above recommend flu vaccine.  You should gradually get better. If not then notify us and would recommend a chest xray.  Follow up in 7-10 days or as needed  Both rapid strep and flu test negative.

## 2018-01-28 NOTE — Progress Notes (Signed)
Subjective:    Patient ID: Brandy Dunlap, female    DOB: 1950/09/02, 67 y.o.   MRN: [id number]  HPI  Pt in sick since Saturday. She states has had nasal and chest congestion. Pt cough is mild productive.   Pt has hx of asthma. She is on singulair, Engineer, structural. States told by specialist hold xolair when sick.  Pt not wheezing but with her history expresses that she may wheeze if illness worsens.  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  Review of Systems  Constitutional: Positive for chills.  HENT: Positive for congestion and sore throat. Negative for facial swelling.        Pt st has been off and on.  Respiratory: Positive for cough. Negative for wheezing.   Cardiovascular: Negative for chest pain and palpitations.  Gastrointestinal: Negative for abdominal pain.  Musculoskeletal: Positive for myalgias. Negative for arthralgias and back pain.  Skin: Negative for rash.  Neurological: Negative for dizziness, speech difficulty, weakness and numbness.  Hematological: Negative for adenopathy. Does not bruise/bleed easily.  Psychiatric/Behavioral: Negative for behavioral problems, dysphoric mood and suicidal ideas. The patient is not nervous/anxious.     Past Medical History:  Diagnosis Date  . Allergy   . Asthma   . B12 deficiency   . Back pain   . Bronchitis    hx  . Colon polyp 10/06/2003  . GERD (gastroesophageal reflux disease)   . Hypertension   . Low back pain   . Osteoarthritis   . Sinus trouble      Social History   Socioeconomic History  . Marital status: Single    Spouse name: Not on file  . Number of children: 1  . Years of education: Not on file  . Highest education level: Not on file  Occupational History  . Occupation: Product manager: [employer]  Social Needs  . Financial resource strain: Not on file  . Food insecurity:    Worry: Not on file    Inability: Not on file  . Transportation  needs:    Medical: Not on file    Non-medical: Not on file  Tobacco Use  . Smoking status: Never Smoker  . Smokeless tobacco: Never Used  Substance and Sexual Activity  . Alcohol use: No  . Drug use: No  . Sexual activity: Yes    Partners: Male    Birth control/protection: Post-menopausal  Lifestyle  . Physical activity:    Days per week: Not on file    Minutes per session: Not on file  . Stress: Not on file  Relationships  . Social connections:    Talks on phone: Not on file    Gets together: Not on file    Attends religious service: Not on file    Active member of club or organization: Not on file    Attends meetings of clubs or organizations: Not on file    Relationship status: Not on file  . Intimate partner violence:    Fear of current or ex partner: Not on file    Emotionally abused: Not on file    Physically abused: Not on file    Forced sexual activity: Not on file  Other Topics Concern  . Not on file  Social History Narrative   Divorced   One daughter- lives locally and one grandson   Retired Pharmacist, hospital,  Has masters degree   Enjoys reading, spending time with  her grandson    Past Surgical History:  Procedure Laterality Date  . CHOLECYSTECTOMY    . COLONOSCOPY    . COLONOSCOPY W/ BIOPSIES  10/06/2003  . LUMBAR FUSION  2008  . PARTIAL HIP ARTHROPLASTY Right 2009    hip replacement  . TONSILLECTOMY    . TOTAL KNEE ARTHROPLASTY Right 11/16/2013   Procedure: RIGHT TOTAL KNEE ARTHROPLASTY;  Surgeon: Vickey Huger, MD;  Location: [facility];  Service: Orthopedics;  Laterality: Right;  [name] VIDEO BRONCHOSCOPY Bilateral 12/05/2012   Procedure: VIDEO BRONCHOSCOPY WITHOUT FLUORO;  Surgeon: Collene Gobble, MD;  Location: [facility];  Service: Cardiopulmonary;  Laterality: Bilateral;    Family History  Problem Relation Age of Onset  . COPD Father        died at age 49  . Emphysema Father   . Heart Problems Father   . Heart disease Brother        stent with 90%  . COPD  Mother   . Heart disease Mother   . Ovarian cancer Mother   . Emphysema Mother   . Hypertension Mother   . Hyperlipidemia Mother   . Heart Problems Mother   . Stroke Mother   . Thyroid disease Mother   . Cancer Mother   . Breast cancer Maternal Aunt   . Colon cancer Neg Hx   . Esophageal cancer Neg Hx   . Rectal cancer Neg Hx   . Stomach cancer Neg Hx     Allergies  Allergen Reactions  . Pantoprazole Sodium Other (See Comments)    Racing heart    Current Outpatient Medications on File Prior to Visit  Medication Sig Dispense Refill  . amLODipine (NORVASC) 5 MG tablet TAKE 1 TABLET BY MOUTH EVERY DAY 90 tablet 1  . budesonide-formoterol (SYMBICORT) 160-4.5 MCG/ACT inhaler Inhale 2 puffs into the lungs 2 (two) times daily.    . Cholecalciferol (VITAMIN D3) 50000 units CAPS Take 1 Dose by mouth once a week. 4 capsule 0  . DEXILANT 60 MG capsule TAKE 1 CAPSULE BY MOUTH EVERY DAY 90 capsule 1  . diclofenac sodium (VOLTAREN) 1 % GEL APPLY (2G) BY TOPICAL ROUTE 4 TIMES EVERY DAY TO THE AFFECTED AREA(S)  1  . EPIPEN 2-PAK 0.3 MG/0.3ML SOAJ injection   1  . montelukast (SINGULAIR) 10 MG tablet Take 10 mg by mouth every evening.  5  . Multiple Vitamin (MULTIVITAMIN) capsule Take 1 capsule by mouth daily.      [name] omalizumab (XOLAIR) 150 MG injection Inject 150 mg into the skin every 14 (fourteen) days.    [name] PROAIR RESPICLICK 725 (90 BASE) MCG/ACT AEPB Inhale 2 puffs into the lungs every 6 (six) hours as needed.  0  . traMADol (ULTRAM) 50 MG tablet TAKE 1 TABLET (50 MG TOTAL) BY MOUTH EVERY 8 (EIGHT) HOURS AS NEEDED. 30 tablet 0   No current facility-administered medications on file prior to visit.     BP 133/75   Pulse 69   Temp 98.6 F (37 C) (Oral)   Resp 16   Ht 5\' 3"  (1.6 m)   Wt 197 lb 9.6 oz (89.6 kg)   SpO2 97%   BMI 35.00 kg/m       Objective:   Physical Exam  General  Mental Status - Alert. General Appearance - Well groomed. Not in acute  distress.  Skin Rashes- No Rashes.  HEENT Head- Normal. Ear Auditory Canal - Left- Normal. Right - Normal.Tympanic Membrane- Left- Normal. Right- Normal. Eye  Sclera/Conjunctiva- Left- Normal. Right- Normal. Nose & Sinuses Nasal Mucosa- Left-  Boggy and Congested. Right-  Boggy and  Congested.Bilateral  No maxillary and no  frontal sinus pressure. Mouth & Throat Lips: Upper Lip- Normal: no dryness, cracking, pallor, cyanosis, or vesicular eruption. Lower Lip-Normal: no dryness, cracking, pallor, cyanosis or vesicular eruption. Buccal Mucosa- Bilateral- No Aphthous ulcers. Oropharynx- No Discharge or Erythema. Tonsils: Characteristics- Bilateral- mild  Erythema or Congestion. Size/Enlargement- Bilateral- No enlargement. Discharge- bilateral-None.  Neck Neck- Supple. No Masses.   Chest and Lung Exam Auscultation: Breath Sounds:-Clear even and unlabored. But faint rough breath sounds when coughs.  Cardiovascular Auscultation:Rythm- Regular, rate and rhythm. Murmurs & Other Heart Sounds:Ausculatation of the heart reveal- No Murmurs.  Lymphatic Head & Neck General Head & Neck Lymphatics: Bilateral: Description- No Localized lymphadenopathy.       Assessment & Plan:  You appear to have bronchitis. Rest hydrate and tylenol for fever. I am prescribing cough medicine  hycodan, and azithromycin antibiotic. For your nasal congestion use you flonase.  For wheezing use you proair and continue singulair. Holding xolair presently. If wheezing worsens then start print tx of 5 day taper prednisone.  When better from above recommend flu vaccine.  You should gradually get better. If not then notify us and would recommend a chest xray.  Follow up in 7-10 days or as needed  [employer], [employer]

## 2018-02-03 ENCOUNTER — Telehealth: Payer: Self-pay | Admitting: Medical

## 2018-02-03 ENCOUNTER — Encounter: Payer: Self-pay | Admitting: Medical

## 2018-02-03 NOTE — Telephone Encounter (Signed)
Opened to review 

## 2018-02-04 ENCOUNTER — Ambulatory Visit (HOSPITAL_BASED_OUTPATIENT_CLINIC_OR_DEPARTMENT_OTHER)
Admission: RE | Admit: 2018-02-04 | Discharge: 2018-02-04 | Disposition: A | Discharge status: Home / Self Care | Payer: Medicare Other | Source: Ambulatory Visit | Attending: Medical | Admitting: Medical

## 2018-02-04 ENCOUNTER — Encounter: Payer: Self-pay | Admitting: Medical

## 2018-02-04 ENCOUNTER — Telehealth: Payer: Self-pay | Admitting: Medical

## 2018-02-04 ENCOUNTER — Ambulatory Visit: Payer: Medicare Other | Admitting: Medical

## 2018-02-04 VITALS — BP 119/65 | HR 59 | Temp 98.1°F | Resp 16 | Ht 63.0 in | Wt 197.2 lb

## 2018-02-04 DIAGNOSIS — R059 Cough, unspecified: Secondary | ICD-10-CM

## 2018-02-04 DIAGNOSIS — J40 Bronchitis, not specified as acute or chronic: Secondary | ICD-10-CM | POA: Diagnosis not present

## 2018-02-04 DIAGNOSIS — R05 Cough: Secondary | ICD-10-CM | POA: Insufficient documentation

## 2018-02-04 DIAGNOSIS — J3489 Other specified disorders of nose and nasal sinuses: Secondary | ICD-10-CM

## 2018-02-04 MED ORDER — CEFTRIAXONE SODIUM 1 G IJ SOLR
1.0000 g | Freq: Once | INTRAMUSCULAR | Status: AC
  Administered 2018-02-04: 1 g via INTRAMUSCULAR

## 2018-02-04 MED ORDER — DOXYCYCLINE HYCLATE 100 MG PO TABS: 100.0000 mg | ORAL_TABLET | Freq: Two times a day (BID) | ORAL | 0 refills | Status: DC

## 2018-02-04 MED ORDER — HYDROCODONE-HOMATROPINE 5-1.5 MG/5ML PO SYRP: 5.0000 mL | ORAL_SOLUTION | Freq: Four times a day (QID) | ORAL | 0 refills | Status: DC | PRN

## 2018-02-04 NOTE — Telephone Encounter (Signed)
Rx doxycycline sent to pt pharmacy. 

## 2018-02-04 NOTE — Patient Instructions (Addendum)
You do appear to have lingering bronchitis symptoms despite last week's treatment.  Glad to hear that you report approximately 50% improvement.  Also glad to hear that you have not been no wheezing.  At this point do want to get chest x-ray to make sure that you do not have any pneumonia present.  All coughing is still an issue so I refilled your Hycodan.  If cough persist beyond the second refill hycodan then will likely recommend using benzonatate or getting clearance from your PCP regarding further controlled medication type cough meds.  We did give you Rocephin 1 g IM injection pending chest x-ray results.  After review of chest x-ray results will likely give second round of oral antibiotic.  Follow-up in 7 to 10 days or as needed.

## 2018-02-04 NOTE — Progress Notes (Signed)
Subjective:    Patient ID: Brandy Dunlap, female    DOB: [birth year], 67 y.o.   MRN: [id number]  HPI   Pt states that she does feel better compared to before.  She states at least 50% better compared to before. Coughing is less but still very bothersome per pt. Pt has not been wheezing. No fevers, no chills or sweats.  She states throat hurts minimal and has fatigue. Still bringing up some thick clear mucus when coughs.  I gave pt z-pack antibiotic and hycodan cough med.   Overall sick know 10-11 days.  Pt request refill of hycodan. I discussed with her benzonatate and she states has used and really did not help much in past.    Review of Systems  Constitutional: Positive for fatigue. Negative for chills and fever.  HENT: Positive for sore throat. Negative for congestion, postnasal drip, sinus pressure, sneezing and trouble swallowing.   Respiratory: Positive for cough. Negative for chest tightness, shortness of breath and wheezing.   Cardiovascular: Negative for chest pain and palpitations.  Gastrointestinal: Positive for abdominal pain.  Musculoskeletal: Negative for back pain and myalgias.  Neurological: Negative for dizziness, speech difficulty, numbness and headaches.  Hematological: Negative for adenopathy. Does not bruise/bleed easily.  Psychiatric/Behavioral: Negative for behavioral problems and confusion.    Past Medical History:  Diagnosis Date  . Allergy   . Asthma   . B12 deficiency   . Back pain   . Bronchitis    hx  . Colon polyp 10/06/2003  . GERD (gastroesophageal reflux disease)   . Hypertension   . Low back pain   . Osteoarthritis   . Sinus trouble      Social History   Socioeconomic History  . Marital status: Single    Spouse name: Not on file  . Number of children: 1  . Years of education: Not on file  . Highest education level: Not on file  Occupational History  . Occupation: Product manager: [employer]  Social Needs    . Financial resource strain: Not on file  . Food insecurity:    Worry: Not on file    Inability: Not on file  . Transportation needs:    Medical: Not on file    Non-medical: Not on file  Tobacco Use  . Smoking status: Never Smoker  . Smokeless tobacco: Never Used  Substance and Sexual Activity  . Alcohol use: No  . Drug use: No  . Sexual activity: Yes    Partners: Male    Birth control/protection: Post-menopausal  Lifestyle  . Physical activity:    Days per week: Not on file    Minutes per session: Not on file  . Stress: Not on file  Relationships  . Social connections:    Talks on phone: Not on file    Gets together: Not on file    Attends religious service: Not on file    Active member of club or organization: Not on file    Attends meetings of clubs or organizations: Not on file    Relationship status: Not on file  . Intimate partner violence:    Fear of current or ex partner: Not on file    Emotionally abused: Not on file    Physically abused: Not on file    Forced sexual activity: Not on file  Other Topics Concern  . Not on file  Social History Narrative   Divorced   One daughter- lives  locally and one grandson   Retired Pharmacist, hospital,  Has masters degree   Enjoys reading, spending time with her grandson    Past Surgical History:  Procedure Laterality Date  . CHOLECYSTECTOMY    . COLONOSCOPY    . COLONOSCOPY W/ BIOPSIES  10/06/2003  . LUMBAR FUSION  2008  . PARTIAL HIP ARTHROPLASTY Right 2009    hip replacement  . TONSILLECTOMY    . TOTAL KNEE ARTHROPLASTY Right 11/16/2013   Procedure: RIGHT TOTAL KNEE ARTHROPLASTY;  Surgeon: Vickey Huger, MD;  Location: [facility];  Service: Orthopedics;  Laterality: Right;  [name] VIDEO BRONCHOSCOPY Bilateral 12/05/2012   Procedure: VIDEO BRONCHOSCOPY WITHOUT FLUORO;  Surgeon: Collene Gobble, MD;  Location: [facility];  Service: Cardiopulmonary;  Laterality: Bilateral;    Family History  Problem Relation Age of Onset  . COPD Father         died at age 93  . Emphysema Father   . Heart Problems Father   . Heart disease Brother        stent with 90%  . COPD Mother   . Heart disease Mother   . Ovarian cancer Mother   . Emphysema Mother   . Hypertension Mother   . Hyperlipidemia Mother   . Heart Problems Mother   . Stroke Mother   . Thyroid disease Mother   . Cancer Mother   . Breast cancer Maternal Aunt   . Colon cancer Neg Hx   . Esophageal cancer Neg Hx   . Rectal cancer Neg Hx   . Stomach cancer Neg Hx     Allergies  Allergen Reactions  . Pantoprazole Sodium Other (See Comments)    Racing heart    Current Outpatient Medications on File Prior to Visit  Medication Sig Dispense Refill  . amLODipine (NORVASC) 5 MG tablet TAKE 1 TABLET BY MOUTH EVERY DAY 90 tablet 1  . budesonide-formoterol (SYMBICORT) 160-4.5 MCG/ACT inhaler Inhale 2 puffs into the lungs 2 (two) times daily.    . Cholecalciferol (VITAMIN D3) 50000 units CAPS Take 1 Dose by mouth once a week. 4 capsule 0  . DEXILANT 60 MG capsule TAKE 1 CAPSULE BY MOUTH EVERY DAY 90 capsule 1  . diclofenac sodium (VOLTAREN) 1 % GEL APPLY (2G) BY TOPICAL ROUTE 4 TIMES EVERY DAY TO THE AFFECTED AREA(S)  1  . EPIPEN 2-PAK 0.3 MG/0.3ML SOAJ injection   1  . montelukast (SINGULAIR) 10 MG tablet Take 10 mg by mouth every evening.  5  . Multiple Vitamin (MULTIVITAMIN) capsule Take 1 capsule by mouth daily.      [name] omalizumab (XOLAIR) 150 MG injection Inject 150 mg into the skin every 14 (fourteen) days.    . predniSONE (DELTASONE) 10 MG tablet 5 TAB PO DAY 1 4 TAB PO DAY 2 3 TAB PO DAY 3 2 TAB PO DAY 4 1 TAB PO DAY 5 15 tablet 0  . PROAIR RESPICLICK 259 (90 BASE) MCG/ACT AEPB Inhale 2 puffs into the lungs every 6 (six) hours as needed.  0  . traMADol (ULTRAM) 50 MG tablet TAKE 1 TABLET (50 MG TOTAL) BY MOUTH EVERY 8 (EIGHT) HOURS AS NEEDED. 30 tablet 0   No current facility-administered medications on file prior to visit.     BP 119/65   Pulse (!) 59    Temp 98.1 F (36.7 C) (Oral)   Resp 16   Ht 5\' 3"  (1.6 m)   Wt 197 lb 3.2 oz (89.4 kg)   SpO2 97%  BMI 34.93 kg/m       Objective:   Physical Exam  General  Mental Status - Alert. General Appearance - Well groomed. Not in acute distress.  Skin Rashes- No Rashes.  HEENT Head- Normal. Ear Auditory Canal - Left- Normal. Right - Normal.Tympanic Membrane- Left- Normal. Right- Normal. Eye Sclera/Conjunctiva- Left- Normal. Right- Normal. Nose & Sinuses Nasal Mucosa- Left-  Boggy and Congested. Right-  Boggy and  Congested.Bilateral no maxillary but positive frontal sinus pressure. Mouth & Throat Lips: Upper Lip- Normal: no dryness, cracking, pallor, cyanosis, or vesicular eruption. Lower Lip-Normal: no dryness, cracking, pallor, cyanosis or vesicular eruption. Buccal Mucosa- Bilateral- No Aphthous ulcers. Oropharynx- No Discharge or Erythema. Tonsils: Characteristics- Bilateral- No Erythema or Congestion. Size/Enlargement- Bilateral- No enlargement. Discharge- bilateral-None.  Neck Neck- Supple. No Masses.   Chest and Lung Exam Auscultation: Breath Sounds:-Clear even and unlabored.  Cardiovascular Auscultation:Rythm- Regular, rate and rhythm. Murmurs & Other Heart Sounds:Ausculatation of the heart reveal- No Murmurs.  Lymphatic Head & Neck General Head & Neck Lymphatics: Bilateral: Description- No Localized lymphadenopathy.       Assessment & Plan:  You do appear to have lingering bronchitis symptoms despite last week's treatment.  Glad to hear that you report approximately 50% improvement.  Also glad to hear that you have not been no wheezing.  At this point do want to get chest x-ray to make sure that you do not have any pneumonia present.  All coughing is still an issue so I refilled your hycodan.  If cough persist beyond the second refill hycodan then will likely recommend using benzonatate or getting clearance from your PCP regarding further controlled  medication type cough meds.  We did give you Rocephin 1 g IM injection pending chest x-ray results.  After review of chest x-ray results will likely give second round of oral antibiotic.  Follow-up in 7 to 10 days or as needed.  Mackie Pai, PA-C

## 2018-02-12 ENCOUNTER — Encounter (INDEPENDENT_AMBULATORY_CARE_PROVIDER_SITE_OTHER): Payer: Self-pay | Admitting: Family Medicine

## 2018-02-12 ENCOUNTER — Ambulatory Visit (INDEPENDENT_AMBULATORY_CARE_PROVIDER_SITE_OTHER): Payer: Medicare Other | Admitting: Family Medicine

## 2018-02-12 VITALS — BP 132/79 | HR 78 | Temp 98.6°F | Ht 63.0 in | Wt 195.0 lb

## 2018-02-12 DIAGNOSIS — E559 Vitamin D deficiency, unspecified: Secondary | ICD-10-CM

## 2018-02-12 DIAGNOSIS — E669 Obesity, unspecified: Secondary | ICD-10-CM | POA: Diagnosis not present

## 2018-02-12 DIAGNOSIS — R7303 Prediabetes: Secondary | ICD-10-CM

## 2018-02-12 DIAGNOSIS — Z6834 Body mass index (BMI) 34.0-34.9, adult: Secondary | ICD-10-CM

## 2018-02-13 NOTE — Progress Notes (Addendum)
Office: [phone]  /  Fax: [phone]   HPI:   Chief Complaint: OBESITY Brandy Dunlap is here to discuss her progress with her obesity treatment plan. She is keeping a food journal with 1200 calories and 70 to 80 grams of protein and is following her eating plan approximately 10 % of the time. She states she is doing low impact aerobics and yoga 45 minutes 5 times per week. Brandy Dunlap's last visit was over 3 months ago. She has been off track and notes carb cravings have been out of control.  Her weight is 195 lb (88.5 kg) today and has had a weight gain of 7 pounds over a period of 5 months since her last visit. She has lost 0 lbs since starting treatment with [name].  Vitamin D deficiency Brandy Dunlap has a diagnosis of vitamin D deficiency. She is stable on vit D, but has been out and notes an increase in fatigue. She denies nausea, vomiting, or muscle weakness.  Pre-Diabetes Brandy Dunlap has a diagnosis of pre-diabetes based on her elevated Hgb A1c and was informed this puts her at greater risk of developing diabetes. She notes increased polyphagia and its been worse with simple carbs in the last 2 to 3 months. She is not taking metformin currently and continues to work on diet and exercise to decrease risk of diabetes.   ASSESSMENT AND PLAN:  Vitamin D deficiency  Prediabetes  Class 1 obesity with serious comorbidity and body mass index (BMI) of 34.0 to 34.9 in adult, unspecified obesity type  PLAN:  Vitamin D Deficiency Brandy Dunlap was informed that low vitamin D levels contributes to fatigue and are associated with obesity, breast, and colon cancer. She agrees to continue to take prescription Vit D @50 ,000 IU every week #4 with no refills and will follow up for routine testing of vitamin D, at least 2-3 times per year. She was informed of the risk of over-replacement of vitamin D and agrees to not increase her dose unless she discusses this with [name] first. We will check labs at her next visit in 3 to 4 weeks. Brandy Dunlap  agrees with this plan.  Pre-Diabetes Brandy Dunlap will continue to work on weight loss, exercise, and decreasing simple carbohydrates in her diet to help decrease the risk of diabetes. She was informed that eating too many simple carbohydrates or too many calories at one sitting increases the likelihood of GI side effects. Brandy Dunlap agrees to get back to her diet. We will check labs at her next visit. Brandy Dunlap agreed to follow up with [name] as directed to monitor her progress.  Obesity Brandy Dunlap is currently in the action stage of change. As such, her goal is to continue with weight loss efforts. She has agreed to keep a food journal with 1200 calories and 70+ grams of protein.  Brandy Dunlap has been instructed to work up to a goal of 150 minutes of combined cardio and strengthening exercise per week for weight loss and overall health benefits. We discussed the following Behavioral Modification Strategies today: increasing lean protein intake, decreasing simple carbohydrates, holiday eating strategies, and celebration eating strategies.  Brandy Dunlap has agreed to follow up with our clinic in 3 to 4 weeks for a fasting appointment. She was informed of the importance of frequent follow up visits to maximize her success with intensive lifestyle modifications for her multiple health conditions.  ALLERGIES: Allergies  Allergen Reactions  . Pantoprazole Sodium Other (See Comments)    Racing heart    MEDICATIONS: Current Outpatient Medications on  File Prior to Visit  Medication Sig Dispense Refill  . amLODipine (NORVASC) 5 MG tablet TAKE 1 TABLET BY MOUTH EVERY DAY 90 tablet 1  . budesonide-formoterol (SYMBICORT) 160-4.5 MCG/ACT inhaler Inhale 2 puffs into the lungs 2 (two) times daily.    . Cholecalciferol (VITAMIN D3) 50000 units CAPS Take 1 Dose by mouth once a week. 4 capsule 0  . DEXILANT 60 MG capsule TAKE 1 CAPSULE BY MOUTH EVERY DAY 90 capsule 1  . diclofenac sodium (VOLTAREN) 1 % GEL APPLY (2G) BY TOPICAL ROUTE 4 TIMES EVERY  DAY TO THE AFFECTED AREA(S)  1  . EPIPEN 2-PAK 0.3 MG/0.3ML SOAJ injection   1  . montelukast (SINGULAIR) 10 MG tablet Take 10 mg by mouth every evening.  5  . Multiple Vitamin (MULTIVITAMIN) capsule Take 1 capsule by mouth daily.      [name] omalizumab (XOLAIR) 150 MG injection Inject 150 mg into the skin every 14 (fourteen) days.    [name] PROAIR RESPICLICK 384 (90 BASE) MCG/ACT AEPB Inhale 2 puffs into the lungs every 6 (six) hours as needed.  0  . traMADol (ULTRAM) 50 MG tablet TAKE 1 TABLET (50 MG TOTAL) BY MOUTH EVERY 8 (EIGHT) HOURS AS NEEDED. 30 tablet 0   No current facility-administered medications on file prior to visit.     PAST MEDICAL HISTORY: Past Medical History:  Diagnosis Date  . Allergy   . Asthma   . B12 deficiency   . Back pain   . Bronchitis    hx  . Colon polyp 10/06/2003  . GERD (gastroesophageal reflux disease)   . Hypertension   . Low back pain   . Osteoarthritis   . Sinus trouble     PAST SURGICAL HISTORY: Past Surgical History:  Procedure Laterality Date  . CHOLECYSTECTOMY    . COLONOSCOPY    . COLONOSCOPY W/ BIOPSIES  10/06/2003  . LUMBAR FUSION  2008  . PARTIAL HIP ARTHROPLASTY Right 2009    hip replacement  . TONSILLECTOMY    . TOTAL KNEE ARTHROPLASTY Right 11/16/2013   Procedure: RIGHT TOTAL KNEE ARTHROPLASTY;  Surgeon: Vickey Huger, MD;  Location: [place];  Service: Orthopedics;  Laterality: Right;  [name] VIDEO BRONCHOSCOPY Bilateral 12/05/2012   Procedure: VIDEO BRONCHOSCOPY WITHOUT FLUORO;  Surgeon: Collene Gobble, MD;  Location: [facility];  Service: Cardiopulmonary;  Laterality: Bilateral;    SOCIAL HISTORY: Social History   Tobacco Use  . Smoking status: Never Smoker  . Smokeless tobacco: Never Used  Substance Use Topics  . Alcohol use: No  . Drug use: No    FAMILY HISTORY: Family History  Problem Relation Age of Onset  . COPD Father        died at age 42  . Emphysema Father   . Heart Problems Father   . Heart disease Brother         stent with 90%  . COPD Mother   . Heart disease Mother   . Ovarian cancer Mother   . Emphysema Mother   . Hypertension Mother   . Hyperlipidemia Mother   . Heart Problems Mother   . Stroke Mother   . Thyroid disease Mother   . Cancer Mother   . Breast cancer Maternal Aunt   . Colon cancer Neg Hx   . Esophageal cancer Neg Hx   . Rectal cancer Neg Hx   . Stomach cancer Neg Hx    ROS: Review of Systems  Constitutional: Negative for weight loss.  Gastrointestinal: Negative  for nausea and vomiting.  Musculoskeletal:       Negative for muscle weakness.  Endo/Heme/Allergies:       Positive for polyphagia.   PHYSICAL EXAM: Blood pressure 132/79, pulse 78, temperature 98.6 F (37 C), temperature source Oral, height 5\' 3"  (1.6 m), weight 195 lb (88.5 kg), SpO2 95 %. Body mass index is 34.54 kg/m. Physical Exam Vitals signs reviewed.  Constitutional:      Appearance: Normal appearance. She is obese.  Cardiovascular:     Rate and Rhythm: Normal rate.  Pulmonary:     Effort: Pulmonary effort is normal.  Musculoskeletal: Normal range of motion.  Skin:    General: Skin is warm and dry.  Neurological:     Mental Status: She is alert and oriented to person, place, and time.  Psychiatric:        Mood and Affect: Mood normal.        Behavior: Behavior normal.    RECENT LABS AND TESTS: BMET    Component Value Date/Time   NA 140 08/14/2017 1324   NA 141 04/04/2017 0838   K 4.4 08/14/2017 1324   CL 104 08/14/2017 1324   CO2 27 08/14/2017 1324   GLUCOSE 84 08/14/2017 1324   BUN 17 08/14/2017 1324   BUN 10 04/04/2017 0838   CREATININE 0.68 08/14/2017 1324   CALCIUM 9.6 08/14/2017 1324   GFRNONAA 91 04/04/2017 0838   GFRAA 105 04/04/2017 0838   Lab Results  Component Value Date   HGBA1C 6.3 (H) 04/04/2017   HGBA1C 5.7 (H) 12/03/2016   HGBA1C 6.0 (H) 07/25/2016   HGBA1C 5.8 (H) 04/17/2016   Lab Results  Component Value Date   INSULIN 15.6 04/04/2017   INSULIN 13.4  12/03/2016   INSULIN 19.7 07/25/2016   INSULIN 13.9 04/17/2016   CBC    Component Value Date/Time   WBC 8.9 08/14/2017 1324   RBC 4.54 08/14/2017 1324   HGB 12.5 08/14/2017 1324   HGB 12.7 04/17/2016 1150   HCT 38.1 08/14/2017 1324   HCT 39.5 04/17/2016 1150   PLT 309.0 08/14/2017 1324   MCV 83.8 08/14/2017 1324   MCV 84 04/17/2016 1150   MCH 27.1 04/17/2016 1150   MCH 29.3 11/18/2013 0705   MCHC 33.0 08/14/2017 1324   RDW 16.3 (H) 08/14/2017 1324   RDW 16.0 (H) 04/17/2016 1150   LYMPHSABS 3.1 08/14/2017 1324   LYMPHSABS 2.7 04/17/2016 1150   MONOABS 0.5 08/14/2017 1324   EOSABS 0.5 08/14/2017 1324   EOSABS 0.2 04/17/2016 1150   BASOSABS 0.1 08/14/2017 1324   BASOSABS 0.0 04/17/2016 1150   Iron/TIBC/Ferritin/ %Sat No results found for: IRON, TIBC, FERRITIN, IRONPCTSAT Lipid Panel     Component Value Date/Time   CHOL 204 (H) 04/04/2017 0838   TRIG 130 04/04/2017 0838   HDL 62 04/04/2017 0838   CHOLHDL 3 10/24/2015 1054   VLDL 21.6 10/24/2015 1054   LDLCALC 116 (H) 04/04/2017 0838   LDLDIRECT 149.9 09/11/2010 0908   Hepatic Function Panel     Component Value Date/Time   PROT 7.0 04/04/2017 0838   ALBUMIN 4.2 04/04/2017 0838   AST 18 04/04/2017 0838   ALT 17 04/04/2017 0838   ALKPHOS 72 04/04/2017 0838   BILITOT 0.3 04/04/2017 0838   BILIDIR 0.0 10/24/2015 1054      Component Value Date/Time   TSH 1.890 04/17/2016 1150   TSH 1.15 10/24/2015 1054   TSH 1.30 04/25/2015 1132   Results for Ferencz, Easton G "Karysa Wesenberg" (  MRN [id number]) as of 02/13/2018 10:12  Ref. Range 04/04/2017 08:38  Vitamin D, 25-Hydroxy Latest Ref Range: 30.0 - 100.0 ng/mL 51.3    OBESITY BEHAVIORAL INTERVENTION VISIT  Today's visit was # 20   Starting weight: 195 lbs Starting date: 04/17/16 Today's weight : Weight: 195 lb (88.5 kg)  Today's date: 02/12/2018 Total lbs lost to date: 0 At least 15 minutes were spent on discussing the following behavioral intervention  visit.  ASK: We discussed the diagnosis of obesity with Annetta Maw today and Lacreasha agreed to give [name] permission to discuss obesity behavioral modification therapy today.  ASSESS: Trinh has the diagnosis of obesity and her BMI today is 34.5. Eydie is in the action stage of change.   ADVISE: Shianne was educated on the multiple health risks of obesity as well as the benefit of weight loss to improve her health. She was advised of the need for long term treatment and the importance of lifestyle modifications to improve her current health and to decrease her risk of future health problems.  AGREE: Multiple dietary modification options and treatment options were discussed and Erlene agreed to follow the recommendations documented in the above note.  ARRANGE: Lahna was educated on the importance of frequent visits to treat obesity as outlined per CMS and USPSTF guidelines and agreed to schedule her next follow up appointment today.  I, Marcille Blanco, am acting as transcriptionist for Starlyn Skeans, MD I have reviewed the above documentation for accuracy and completeness, and I agree with the above. -Dennard Nip, MD

## 2018-03-04 ENCOUNTER — Other Ambulatory Visit: Payer: Self-pay | Admitting: Family

## 2018-03-13 ENCOUNTER — Ambulatory Visit (INDEPENDENT_AMBULATORY_CARE_PROVIDER_SITE_OTHER): Payer: Medicare Other | Admitting: Family Medicine

## 2018-03-13 ENCOUNTER — Encounter (INDEPENDENT_AMBULATORY_CARE_PROVIDER_SITE_OTHER): Payer: Self-pay | Admitting: Family Medicine

## 2018-03-13 VITALS — BP 127/72 | HR 70 | Temp 98.0°F | Ht 63.0 in | Wt 192.0 lb

## 2018-03-13 DIAGNOSIS — Z6834 Body mass index (BMI) 34.0-34.9, adult: Secondary | ICD-10-CM

## 2018-03-13 DIAGNOSIS — E559 Vitamin D deficiency, unspecified: Secondary | ICD-10-CM

## 2018-03-13 DIAGNOSIS — E669 Obesity, unspecified: Secondary | ICD-10-CM

## 2018-03-13 DIAGNOSIS — I10 Essential (primary) hypertension: Secondary | ICD-10-CM | POA: Diagnosis not present

## 2018-03-13 MED ORDER — VITAMIN D (ERGOCALCIFEROL) 1.25 MG (50000 UNIT) PO CAPS: 50000.0000 [IU] | ORAL_CAPSULE | ORAL | 0 refills | Status: DC

## 2018-03-14 ENCOUNTER — Other Ambulatory Visit: Payer: Self-pay | Admitting: Family

## 2018-03-17 NOTE — Progress Notes (Signed)
Office: [phone]  /  Fax: [phone]   HPI:   Chief Complaint: OBESITY Brandy Dunlap is here to discuss her progress with her obesity treatment plan. She is on the keep a food journal with 1200 calories and 70+ grams of protein daily and is following her eating plan approximately 60 % of the time. She states she is doing low impact aerobics for 45 minutes 3 times per week. Brandy Dunlap continues to do well with weight loss even over the holidays. She is ready to get back on track and journal strictly. She asks for advice on meal planning and prepping and higher protein vegetables.  Her weight is 192 lb (87.1 kg) today and has had a weight loss of 3 pounds over a period of 4 weeks since her last visit. She has lost 3 lbs since starting treatment with [name].  Vitamin D Deficiency Brandy Dunlap has a diagnosis of vitamin D deficiency. She is stable on prescription Vit D, but level is not yet at goal. She denies nausea, vomiting or muscle weakness.  Hypertension Brandy Dunlap is a 68 y.o. female with hypertension. [name]'s blood pressure is stable on Norvasc. She denies chest pain or lightheadedness. She is working on diet, exercise, and weight loss to help control her blood pressure with the goal of decreasing her risk of heart attack and stroke. Brandy Dunlap's blood pressure is currently controlled.  ASSESSMENT AND PLAN:  Vitamin D deficiency - Plan: Vitamin D, Ergocalciferol, (DRISDOL) 1.25 MG (50000 UT) CAPS capsule  Essential hypertension  Class 1 obesity with serious comorbidity and body mass index (BMI) of 34.0 to 34.9 in adult, unspecified obesity type  PLAN:  Vitamin D Deficiency Brandy Dunlap was informed that low vitamin D levels contributes to fatigue and are associated with obesity, breast, and colon cancer. Brandy Dunlap agrees to continue taking prescription Vit D @50 ,000 IU every week #4 and we will refill for 1 month. She will follow up for routine testing of vitamin D, at least 2-3 times per year. She was informed of  the risk of over-replacement of vitamin D and agrees to not increase her dose unless she discusses this with [name] first. Brandy Dunlap agrees to follow up with our clinic in 4 weeks.  Hypertension We discussed sodium restriction, working on healthy weight loss, and a regular exercise program as the means to achieve improved blood pressure control. Brandy Dunlap agreed with this plan and agreed to follow up as directed. We will continue to monitor her blood pressure as well as her progress with the above lifestyle modifications. She will continue diet, and her medications and will watch for signs of hypotension as she continues her lifestyle modifications. Brandy Dunlap agrees to follow up with our clinic in 4 weeks.  Obesity Brandy Dunlap is currently in the action stage of change. As such, her goal is to continue with weight loss efforts She has agreed to keep a food journal with 1200 calories and 70+ grams of protein daily Brandy Dunlap has been instructed to work up to a goal of 150 minutes of combined cardio and strengthening exercise per week for weight loss and overall health benefits. We discussed the following Behavioral Modification Strategies today: increasing lean protein intake, decreasing simple carbohydrates, work on meal planning and easy cooking plans, planning for success, and keep a strict food journal   Brandy Dunlap has agreed to follow up with our clinic in 4 weeks. She was informed of the importance of frequent follow up visits to maximize her success with intensive lifestyle modifications for her  multiple health conditions.  ALLERGIES: Allergies  Allergen Reactions  . Pantoprazole Sodium Other (See Comments)    Racing heart    MEDICATIONS: Current Outpatient Medications on File Prior to Visit  Medication Sig Dispense Refill  . budesonide-formoterol (SYMBICORT) 160-4.5 MCG/ACT inhaler Inhale 2 puffs into the lungs 2 (two) times daily.    . Cholecalciferol (VITAMIN D3) 50000 units CAPS Take 1 Dose by mouth once a week. 4  capsule 0  . DEXILANT 60 MG capsule TAKE 1 CAPSULE BY MOUTH EVERY DAY 90 capsule 1  . diclofenac sodium (VOLTAREN) 1 % GEL APPLY (2G) BY TOPICAL ROUTE 4 TIMES EVERY DAY TO THE AFFECTED AREA(S)  1  . EPIPEN 2-PAK 0.3 MG/0.3ML SOAJ injection   1  . montelukast (SINGULAIR) 10 MG tablet Take 10 mg by mouth every evening.  5  . Multiple Vitamin (MULTIVITAMIN) capsule Take 1 capsule by mouth daily.      Brandy Dunlap omalizumab (XOLAIR) 150 MG injection Inject 150 mg into the skin every 14 (fourteen) days.    Brandy Dunlap PROAIR RESPICLICK 505 (90 BASE) MCG/ACT AEPB Inhale 2 puffs into the lungs every 6 (six) hours as needed.  0  . traMADol (ULTRAM) 50 MG tablet TAKE 1 TABLET (50 MG TOTAL) BY MOUTH EVERY 8 (EIGHT) HOURS AS NEEDED. 30 tablet 0   No current facility-administered medications on file prior to visit.     PAST MEDICAL HISTORY: Past Medical History:  Diagnosis Date  . Allergy   . Asthma   . B12 deficiency   . Back pain   . Bronchitis    hx  . Colon polyp 10/06/2003  . GERD (gastroesophageal reflux disease)   . Hypertension   . Low back pain   . Osteoarthritis   . Sinus trouble     PAST SURGICAL HISTORY: Past Surgical History:  Procedure Laterality Date  . CHOLECYSTECTOMY    . COLONOSCOPY    . COLONOSCOPY W/ BIOPSIES  10/06/2003  . LUMBAR FUSION  2008  . PARTIAL HIP ARTHROPLASTY Right 2009    hip replacement  . TONSILLECTOMY    . TOTAL KNEE ARTHROPLASTY Right 11/16/2013   Procedure: RIGHT TOTAL KNEE ARTHROPLASTY;  Surgeon: Vickey Huger, MD;  Location: [place];  Service: Orthopedics;  Laterality: Right;  Brandy Dunlap VIDEO BRONCHOSCOPY Bilateral 12/05/2012   Procedure: VIDEO BRONCHOSCOPY WITHOUT FLUORO;  Surgeon: Collene Gobble, MD;  Location: [facility];  Service: Cardiopulmonary;  Laterality: Bilateral;    SOCIAL HISTORY: Social History   Tobacco Use  . Smoking status: Never Smoker  . Smokeless tobacco: Never Used  Substance Use Topics  . Alcohol use: No  . Drug use: No    FAMILY  HISTORY: Family History  Problem Relation Age of Onset  . COPD Father        died at age 38  . Emphysema Father   . Heart Problems Father   . Heart disease Brother        stent with 90%  . COPD Mother   . Heart disease Mother   . Ovarian cancer Mother   . Emphysema Mother   . Hypertension Mother   . Hyperlipidemia Mother   . Heart Problems Mother   . Stroke Mother   . Thyroid disease Mother   . Cancer Mother   . Breast cancer Maternal Aunt   . Colon cancer Neg Hx   . Esophageal cancer Neg Hx   . Rectal cancer Neg Hx   . Stomach cancer Neg Hx  ROS: Review of Systems  Constitutional: Positive for weight loss.  Cardiovascular: Negative for chest pain.  Gastrointestinal: Negative for nausea and vomiting.  Musculoskeletal:       Negative muscle weakness  Neurological:       Negative lightheadedness    PHYSICAL EXAM: Blood pressure 127/72, pulse 70, temperature 98 F (36.7 C), temperature source Oral, height 5\' 3"  (1.6 m), weight 192 lb (87.1 kg), SpO2 96 %. Body mass index is 34.01 kg/m. Physical Exam Vitals signs reviewed.  Constitutional:      Appearance: Normal appearance. She is obese.  Cardiovascular:     Rate and Rhythm: Normal rate.     Pulses: Normal pulses.  Pulmonary:     Effort: Pulmonary effort is normal.     Breath sounds: Normal breath sounds.  Musculoskeletal: Normal range of motion.  Skin:    General: Skin is warm and dry.  Neurological:     Mental Status: She is alert and oriented to person, place, and time.  Psychiatric:        Mood and Affect: Mood normal.        Behavior: Behavior normal.     RECENT LABS AND TESTS: BMET    Component Value Date/Time   NA 140 08/14/2017 1324   NA 141 04/04/2017 0838   K 4.4 08/14/2017 1324   CL 104 08/14/2017 1324   CO2 27 08/14/2017 1324   GLUCOSE 84 08/14/2017 1324   BUN 17 08/14/2017 1324   BUN 10 04/04/2017 0838   CREATININE 0.68 08/14/2017 1324   CALCIUM 9.6 08/14/2017 1324   GFRNONAA  91 04/04/2017 0838   GFRAA 105 04/04/2017 0838   Lab Results  Component Value Date   HGBA1C 6.3 (H) 04/04/2017   HGBA1C 5.7 (H) 12/03/2016   HGBA1C 6.0 (H) 07/25/2016   HGBA1C 5.8 (H) 04/17/2016   Lab Results  Component Value Date   INSULIN 15.6 04/04/2017   INSULIN 13.4 12/03/2016   INSULIN 19.7 07/25/2016   INSULIN 13.9 04/17/2016   CBC    Component Value Date/Time   WBC 8.9 08/14/2017 1324   RBC 4.54 08/14/2017 1324   HGB 12.5 08/14/2017 1324   HGB 12.7 04/17/2016 1150   HCT 38.1 08/14/2017 1324   HCT 39.5 04/17/2016 1150   PLT 309.0 08/14/2017 1324   MCV 83.8 08/14/2017 1324   MCV 84 04/17/2016 1150   MCH 27.1 04/17/2016 1150   MCH 29.3 11/18/2013 0705   MCHC 33.0 08/14/2017 1324   RDW 16.3 (H) 08/14/2017 1324   RDW 16.0 (H) 04/17/2016 1150   LYMPHSABS 3.1 08/14/2017 1324   LYMPHSABS 2.7 04/17/2016 1150   MONOABS 0.5 08/14/2017 1324   EOSABS 0.5 08/14/2017 1324   EOSABS 0.2 04/17/2016 1150   BASOSABS 0.1 08/14/2017 1324   BASOSABS 0.0 04/17/2016 1150   Iron/TIBC/Ferritin/ %Sat No results found for: IRON, TIBC, FERRITIN, IRONPCTSAT Lipid Panel     Component Value Date/Time   CHOL 204 (H) 04/04/2017 0838   TRIG 130 04/04/2017 0838   HDL 62 04/04/2017 0838   CHOLHDL 3 10/24/2015 1054   VLDL 21.6 10/24/2015 1054   LDLCALC 116 (H) 04/04/2017 0838   LDLDIRECT 149.9 09/11/2010 0908   Hepatic Function Panel     Component Value Date/Time   PROT 7.0 04/04/2017 0838   ALBUMIN 4.2 04/04/2017 0838   AST 18 04/04/2017 0838   ALT 17 04/04/2017 0838   ALKPHOS 72 04/04/2017 0838   BILITOT 0.3 04/04/2017 0838   BILIDIR 0.0 10/24/2015 1054  Component Value Date/Time   TSH 1.890 04/17/2016 1150   TSH 1.15 10/24/2015 1054   TSH 1.30 04/25/2015 1132      OBESITY BEHAVIORAL INTERVENTION VISIT  Today's visit was # 21   Starting weight: 195 lbs Starting date: 04/17/16 Today's weight : 192 lbs  Today's date: 03/13/2018 Total lbs lost to date: 3 At least  15 minutes were spent on discussing the following behavioral intervention visit.   ASK: We discussed the diagnosis of obesity with Annetta Maw today and Kayelee agreed to give [name] permission to discuss obesity behavioral modification therapy today.  ASSESS: Capucine has the diagnosis of obesity and her BMI today is 34.02 Hadeel is in the action stage of change   ADVISE: Aerionna was educated on the multiple health risks of obesity as well as the benefit of weight loss to improve her health. She was advised of the need for long term treatment and the importance of lifestyle modifications to improve her current health and to decrease her risk of future health problems.  AGREE: Multiple dietary modification options and treatment options were discussed and  Alethea agreed to follow the recommendations documented in the above note.  ARRANGE: Kiaraliz was educated on the importance of frequent visits to treat obesity as outlined per CMS and USPSTF guidelines and agreed to schedule her next follow up appointment today.  I, Trixie Dredge, am acting as transcriptionist for Dennard Nip, MD  I have reviewed the above documentation for accuracy and completeness, and I agree with the above. -Dennard Nip, MD

## 2018-03-27 ENCOUNTER — Encounter: Payer: Self-pay | Admitting: Family

## 2018-03-27 MED ORDER — TRAMADOL HCL 50 MG PO TABS: 50.0000 mg | ORAL_TABLET | Freq: Three times a day (TID) | ORAL | 0 refills | Status: DC | PRN

## 2018-03-27 MED ORDER — DICLOFENAC SODIUM 1 % TD GEL: TRANSDERMAL | 3 refills | Status: DC

## 2018-04-05 ENCOUNTER — Other Ambulatory Visit (INDEPENDENT_AMBULATORY_CARE_PROVIDER_SITE_OTHER): Payer: Self-pay | Admitting: Family Medicine

## 2018-04-05 DIAGNOSIS — E559 Vitamin D deficiency, unspecified: Secondary | ICD-10-CM

## 2018-04-09 ENCOUNTER — Encounter (INDEPENDENT_AMBULATORY_CARE_PROVIDER_SITE_OTHER): Payer: Self-pay

## 2018-04-10 ENCOUNTER — Encounter (INDEPENDENT_AMBULATORY_CARE_PROVIDER_SITE_OTHER): Payer: Self-pay | Admitting: Bariatrics

## 2018-04-10 ENCOUNTER — Ambulatory Visit (INDEPENDENT_AMBULATORY_CARE_PROVIDER_SITE_OTHER): Payer: Medicare Other | Admitting: Bariatrics

## 2018-04-10 VITALS — BP 141/79 | HR 68 | Temp 98.0°F | Ht 63.0 in | Wt 192.0 lb

## 2018-04-10 DIAGNOSIS — R7309 Other abnormal glucose: Secondary | ICD-10-CM | POA: Diagnosis not present

## 2018-04-10 DIAGNOSIS — I10 Essential (primary) hypertension: Secondary | ICD-10-CM | POA: Diagnosis not present

## 2018-04-10 DIAGNOSIS — Z9189 Other specified personal risk factors, not elsewhere classified: Secondary | ICD-10-CM | POA: Diagnosis not present

## 2018-04-10 DIAGNOSIS — E669 Obesity, unspecified: Secondary | ICD-10-CM

## 2018-04-10 DIAGNOSIS — E559 Vitamin D deficiency, unspecified: Secondary | ICD-10-CM

## 2018-04-10 DIAGNOSIS — Z6834 Body mass index (BMI) 34.0-34.9, adult: Secondary | ICD-10-CM

## 2018-04-10 MED ORDER — VITAMIN D3 1.25 MG (50000 UT) PO CAPS: 1.0000 | ORAL_CAPSULE | ORAL | 0 refills | Status: DC

## 2018-04-10 NOTE — Progress Notes (Signed)
Office: [phone]  /  Fax: [phone]   HPI:   Chief Complaint: OBESITY Brandy Dunlap is here to discuss her progress with her obesity treatment plan. She is on the keep a food journal with 1200 calories and 70+ grams of protein daily plan and is following her eating plan approximately 60 % of the time. She states she is doing yoga 3 times per week. [name] has done reasonably well overall. She has not had weight loss since her last visit Her weight is 192 lb (87.1 kg) today and she has maintained weight over a period of 4 weeks since her last visit. She has lost 3 lbs since starting treatment with [name].  Vitamin D deficiency Murray has a diagnosis of vitamin D deficiency. She is currently taking high dose vit D and denies nausea, vomiting or muscle weakness.  At risk for osteopenia and osteoporosis Russell is at higher risk of osteopenia and osteoporosis due to vitamin D deficiency.   Hypertension TOINETTE Dunlap is a 68 y.o. female with hypertension. She is currently taking Norvasc. Valena Ivanov Wartman denies chest pain. She is working weight loss to help control her blood pressure with the goal of decreasing her risk of heart attack and stroke. [name] blood pressure is reasonably well controlled.  Elevated Glucose Corita has a history of some elevated blood glucose readings without a diagnosis of diabetes. She denies polyphagia.  ASSESSMENT AND PLAN:  Vitamin D deficiency - Plan: VITAMIN D 25 Hydroxy (Vit-D Deficiency, Fractures), Cholecalciferol (VITAMIN D3) 1.25 MG (50000 UT) CAPS  Essential hypertension  Elevated glucose - Plan: Comprehensive metabolic panel, Hemoglobin A1c, Insulin, random  At risk for osteoporosis  Class 1 obesity with serious comorbidity and body mass index (BMI) of 34.0 to 34.9 in adult, unspecified obesity type  PLAN:  Vitamin D Deficiency Brandy Dunlap was informed that low vitamin D levels contributes to fatigue and are associated with obesity, breast, and colon cancer. She  agrees to take prescription Vit D3 @50 ,000 IU every week #4 with no refills and will follow up for routine testing of vitamin D, at least 2-3 times per year. She was informed of the risk of over-replacement of vitamin D and agrees to not increase her dose unless she discusses this with [name] first. Alexxis agrees to follow up as directed.  At risk for osteopenia and osteoporosis Brandy Dunlap was given extended  (15 minutes) osteoporosis prevention counseling today. Brandy Dunlap is at risk for osteopenia and osteoporosis due to her vitamin D deficiency. She was encouraged to take her vitamin D and follow her higher calcium diet and increase strengthening exercise to help strengthen her bones and decrease her risk of osteopenia and osteoporosis.  Elevated Glucose Fasting labs (Hgb A1c, insulin level) will be obtained and results with be discussed with Brandy Dunlap in 2 weeks at her follow up visit. In the meanwhile [name] will work on weight loss efforts and follow up as directed.  Obesity [name] is currently in the action stage of change. As such, her goal is to continue with weight loss efforts She has agreed to keep a food journal with 1200 calories and 70 grams of protein daily [name] has been instructed to work up to a goal of 150 minutes of combined cardio and strengthening exercise per week for weight loss and overall health benefits. We discussed the following Behavioral Modification Strategies today: increase H2O intake, no skipping meals, increasing lean protein intake, decreasing simple carbohydrates, increasing vegetables and work on meal planning (seafood)and easy cooking plans  Brandy Dunlap has agreed to follow up with our clinic in 4 weeks. She was informed of the importance of frequent follow up visits to maximize her success with intensive lifestyle modifications for her multiple health conditions.  ALLERGIES: Allergies  Allergen Reactions  . Pantoprazole Sodium Other (See Comments)    Racing heart     MEDICATIONS: Current Outpatient Medications on File Prior to Visit  Medication Sig Dispense Refill  . amLODipine (NORVASC) 5 MG tablet TAKE 1 TABLET BY MOUTH EVERY DAY 90 tablet 1  . budesonide-formoterol (SYMBICORT) 160-4.5 MCG/ACT inhaler Inhale 2 puffs into the lungs 2 (two) times daily.    Brandy Dunlap DEXILANT 60 MG capsule TAKE 1 CAPSULE BY MOUTH EVERY DAY 90 capsule 1  . diclofenac sodium (VOLTAREN) 1 % GEL APPLY (2G) BY TOPICAL ROUTE 4 TIMES EVERY DAY TO THE AFFECTED AREA(S) 100 g 3  . EPIPEN 2-PAK 0.3 MG/0.3ML SOAJ injection   1  . montelukast (SINGULAIR) 10 MG tablet Take 10 mg by mouth every evening.  5  . Multiple Vitamin (MULTIVITAMIN) capsule Take 1 capsule by mouth daily.      Brandy Dunlap omalizumab (XOLAIR) 150 MG injection Inject 150 mg into the skin every 14 (fourteen) days.    Brandy Dunlap PROAIR RESPICLICK 952 (90 BASE) MCG/ACT AEPB Inhale 2 puffs into the lungs every 6 (six) hours as needed.  0  . traMADol (ULTRAM) 50 MG tablet Take 1 tablet (50 mg total) by mouth every 8 (eight) hours as needed. 30 tablet 0   No current facility-administered medications on file prior to visit.     PAST MEDICAL HISTORY: Past Medical History:  Diagnosis Date  . Allergy   . Asthma   . B12 deficiency   . Back pain   . Bronchitis    hx  . Colon polyp 10/06/2003  . GERD (gastroesophageal reflux disease)   . Hypertension   . Low back pain   . Osteoarthritis   . Sinus trouble     PAST SURGICAL HISTORY: Past Surgical History:  Procedure Laterality Date  . CHOLECYSTECTOMY    . COLONOSCOPY    . COLONOSCOPY W/ BIOPSIES  10/06/2003  . LUMBAR FUSION  2008  . PARTIAL HIP ARTHROPLASTY Right 2009    hip replacement  . TONSILLECTOMY    . TOTAL KNEE ARTHROPLASTY Right 11/16/2013   Procedure: RIGHT TOTAL KNEE ARTHROPLASTY;  Surgeon: Brandy Huger, MD;  Location: [facility];  Service: Orthopedics;  Laterality: Right;  Brandy Dunlap VIDEO BRONCHOSCOPY Bilateral 12/05/2012   Procedure: VIDEO BRONCHOSCOPY WITHOUT FLUORO;  Surgeon:  Brandy Gobble, MD;  Location: [facility];  Service: Cardiopulmonary;  Laterality: Bilateral;    SOCIAL HISTORY: Social History   Tobacco Use  . Smoking status: Never Smoker  . Smokeless tobacco: Never Used  Substance Use Topics  . Alcohol use: No  . Drug use: No    FAMILY HISTORY: Family History  Problem Relation Age of Onset  . COPD Father        died at age 46  . Emphysema Father   . Heart Problems Father   . Heart disease Brother        stent with 90%  . COPD Mother   . Heart disease Mother   . Ovarian cancer Mother   . Emphysema Mother   . Hypertension Mother   . Hyperlipidemia Mother   . Heart Problems Mother   . Stroke Mother   . Thyroid disease Mother   . Cancer Mother   . Breast cancer Maternal Aunt   .  Colon cancer Neg Hx   . Esophageal cancer Neg Hx   . Rectal cancer Neg Hx   . Stomach cancer Neg Hx     ROS: Review of Systems  Constitutional: Negative for weight loss.  Cardiovascular: Negative for chest pain.  Gastrointestinal: Negative for nausea and vomiting.  Musculoskeletal:       Negative for muscle weakness  Endo/Heme/Allergies:       Negative for polyphagia    PHYSICAL EXAM: Blood pressure (!) 141/79, pulse 68, temperature 98 F (36.7 C), temperature source Oral, height 5\' 3"  (1.6 m), weight 192 lb (87.1 kg), SpO2 97 %. Body mass index is 34.01 kg/m. Physical Exam Vitals signs reviewed.  Constitutional:      Appearance: Normal appearance. She is well-developed. She is obese.  Cardiovascular:     Rate and Rhythm: Normal rate.  Pulmonary:     Effort: Pulmonary effort is normal.  Musculoskeletal: Normal range of motion.  Skin:    General: Skin is warm and dry.  Neurological:     Mental Status: She is alert and oriented to person, place, and time.  Psychiatric:        Mood and Affect: Mood normal.        Behavior: Behavior normal.     RECENT LABS AND TESTS: BMET    Component Value Date/Time   NA 140 08/14/2017 1324   NA  141 04/04/2017 0838   K 4.4 08/14/2017 1324   CL 104 08/14/2017 1324   CO2 27 08/14/2017 1324   GLUCOSE 84 08/14/2017 1324   BUN 17 08/14/2017 1324   BUN 10 04/04/2017 0838   CREATININE 0.68 08/14/2017 1324   CALCIUM 9.6 08/14/2017 1324   GFRNONAA 91 04/04/2017 0838   GFRAA 105 04/04/2017 0838   Lab Results  Component Value Date   HGBA1C 6.3 (H) 04/04/2017   HGBA1C 5.7 (H) 12/03/2016   HGBA1C 6.0 (H) 07/25/2016   HGBA1C 5.8 (H) 04/17/2016   Lab Results  Component Value Date   INSULIN 15.6 04/04/2017   INSULIN 13.4 12/03/2016   INSULIN 19.7 07/25/2016   INSULIN 13.9 04/17/2016   CBC    Component Value Date/Time   WBC 8.9 08/14/2017 1324   RBC 4.54 08/14/2017 1324   HGB 12.5 08/14/2017 1324   HGB 12.7 04/17/2016 1150   HCT 38.1 08/14/2017 1324   HCT 39.5 04/17/2016 1150   PLT 309.0 08/14/2017 1324   MCV 83.8 08/14/2017 1324   MCV 84 04/17/2016 1150   MCH 27.1 04/17/2016 1150   MCH 29.3 11/18/2013 0705   MCHC 33.0 08/14/2017 1324   RDW 16.3 (H) 08/14/2017 1324   RDW 16.0 (H) 04/17/2016 1150   LYMPHSABS 3.1 08/14/2017 1324   LYMPHSABS 2.7 04/17/2016 1150   MONOABS 0.5 08/14/2017 1324   EOSABS 0.5 08/14/2017 1324   EOSABS 0.2 04/17/2016 1150   BASOSABS 0.1 08/14/2017 1324   BASOSABS 0.0 04/17/2016 1150   Iron/TIBC/Ferritin/ %Sat No results found for: IRON, TIBC, FERRITIN, IRONPCTSAT Lipid Panel     Component Value Date/Time   CHOL 204 (H) 04/04/2017 0838   TRIG 130 04/04/2017 0838   HDL 62 04/04/2017 0838   CHOLHDL 3 10/24/2015 1054   VLDL 21.6 10/24/2015 1054   LDLCALC 116 (H) 04/04/2017 0838   LDLDIRECT 149.9 09/11/2010 0908   Hepatic Function Panel     Component Value Date/Time   PROT 7.0 04/04/2017 0838   ALBUMIN 4.2 04/04/2017 0838   AST 18 04/04/2017 0838   ALT 17 04/04/2017  0838   ALKPHOS 72 04/04/2017 0838   BILITOT 0.3 04/04/2017 0838   BILIDIR 0.0 10/24/2015 1054      Component Value Date/Time   TSH 1.890 04/17/2016 1150   TSH 1.15  10/24/2015 1054   TSH 1.30 04/25/2015 1132     Ref. Range 04/04/2017 08:38  Vitamin D, 25-Hydroxy Latest Ref Range: 30.0 - 100.0 ng/mL 51.3     OBESITY BEHAVIORAL INTERVENTION VISIT  Today's visit was # 22  Starting weight: 195 lbs Starting date: 04/17/2016 Today's weight : 192 lbs Today's date: 04/10/2018 Total lbs lost to date: 3   ASK: We discussed the diagnosis of obesity with Annetta Maw today and Adja agreed to give [name] permission to discuss obesity behavioral modification therapy today.  ASSESS: Zorina has the diagnosis of obesity and her BMI today is 34.02 Adlai is in the action stage of change   ADVISE: Nashia was educated on the multiple health risks of obesity as well as the benefit of weight loss to improve her health. She was advised of the need for long term treatment and the importance of lifestyle modifications to improve her current health and to decrease her risk of future health problems.  AGREE: Multiple dietary modification options and treatment options were discussed and  Maribel agreed to follow the recommendations documented in the above note.  ARRANGE: Verdell was educated on the importance of frequent visits to treat obesity as outlined per CMS and USPSTF guidelines and agreed to schedule her next follow up appointment today.  Corey Skains, am acting as Location manager for [employer]. Owens Shark, DO  I have reviewed the above documentation for accuracy and completeness, and I agree with the above. -Jearld Lesch, DO

## 2018-04-11 LAB — COMPREHENSIVE METABOLIC PANEL
ALT: 18 IU/L (ref 0–32)
AST: 19 IU/L (ref 0–40)
Albumin/Globulin Ratio: 1.7 (ref 1.2–2.2)
Albumin: 4.1 g/dL (ref 3.8–4.8)
Alkaline Phosphatase: 74 IU/L (ref 39–117)
BUN/Creatinine Ratio: 16 (ref 12–28)
BUN: 12 mg/dL (ref 8–27)
Bilirubin Total: 0.4 mg/dL (ref 0.0–1.2)
CO2: 19 mmol/L — ABNORMAL LOW (ref 20–29)
Calcium: 9.3 mg/dL (ref 8.7–10.3)
Chloride: 108 mmol/L — ABNORMAL HIGH (ref 96–106)
Creatinine, Ser: 0.74 mg/dL (ref 0.57–1.00)
GFR calc Af Amer: 97 mL/min/{1.73_m2} (ref >59)
GFR calc non Af Amer: 84 mL/min/{1.73_m2} (ref >59)
Globulin, Total: 2.4 g/dL (ref 1.5–4.5)
Glucose: 101 mg/dL — ABNORMAL HIGH (ref 65–99)
Potassium: 3.9 mmol/L (ref 3.5–5.2)
Sodium: 145 mmol/L — ABNORMAL HIGH (ref 134–144)
Total Protein: 6.5 g/dL (ref 6.0–8.5)

## 2018-04-11 LAB — INSULIN, RANDOM: INSULIN: 14.5 u[IU]/mL (ref 2.6–24.9)

## 2018-04-11 LAB — HEMOGLOBIN A1C
Est. average glucose Bld gHb Est-mCnc: 126 mg/dL
Hgb A1c MFr Bld: 6 % — ABNORMAL HIGH (ref 4.8–5.6)

## 2018-04-11 LAB — VITAMIN D 25 HYDROXY (VIT D DEFICIENCY, FRACTURES): Vit D, 25-Hydroxy: 28.6 ng/mL — ABNORMAL LOW (ref 30.0–100.0)

## 2018-04-16 ENCOUNTER — Telehealth: Payer: Self-pay | Admitting: *Deleted

## 2018-04-16 NOTE — Telephone Encounter (Signed)
Received Medical/Surgical Clearance Form from [facility] - Dr. Wynelle Link; forwarded to provider/SLS 02/19

## 2018-04-17 ENCOUNTER — Telehealth: Payer: Self-pay | Admitting: Family

## 2018-04-17 NOTE — Telephone Encounter (Signed)
I received a request for medical clearance from Dr. Maureen Ralphs for planned left total hip replacement. Please contact pt to arrange surgical clearance visit with me.

## 2018-04-18 NOTE — Telephone Encounter (Signed)
Patient was scheduled for 04-28-2018.

## 2018-04-28 ENCOUNTER — Ambulatory Visit (HOSPITAL_BASED_OUTPATIENT_CLINIC_OR_DEPARTMENT_OTHER)
Admission: RE | Admit: 2018-04-28 | Discharge: 2018-04-28 | Disposition: A | Discharge status: Home / Self Care | Payer: Medicare Other | Source: Ambulatory Visit | Attending: Family | Admitting: Family

## 2018-04-28 ENCOUNTER — Ambulatory Visit: Payer: Medicare Other | Admitting: Family

## 2018-04-28 ENCOUNTER — Encounter: Payer: Self-pay | Admitting: Family

## 2018-04-28 VITALS — BP 140/69 | HR 86 | Temp 98.3°F | Resp 16 | Ht 63.0 in | Wt 197.4 lb

## 2018-04-28 DIAGNOSIS — L989 Disorder of the skin and subcutaneous tissue, unspecified: Secondary | ICD-10-CM | POA: Diagnosis not present

## 2018-04-28 DIAGNOSIS — R05 Cough: Secondary | ICD-10-CM | POA: Insufficient documentation

## 2018-04-28 DIAGNOSIS — R059 Cough, unspecified: Secondary | ICD-10-CM

## 2018-04-28 DIAGNOSIS — J45901 Unspecified asthma with (acute) exacerbation: Secondary | ICD-10-CM

## 2018-04-28 MED ORDER — PREDNISONE 10 MG PO TABS: ORAL_TABLET | ORAL | 0 refills | Status: DC

## 2018-04-28 NOTE — Patient Instructions (Signed)
Please begin prednisone taper. Complete chest x-ray on the first floor.  Call if symptoms worsen or if not improved in 3-4 days.

## 2018-04-28 NOTE — Progress Notes (Signed)
Subjective:    Patient ID: Brandy Dunlap, female    DOB: Dec 12, 1950, 68 y.o.   MRN: [id number]  HPI  Patient reports that she has a "really bad cough." reports that she has thick white mucous.  Having bladder leakage due to cough.  Has follow up with Dr. Harold Hedge (this is scheduled next month).  Reports that she has had some low grad fevers 99.3-99.5.  Reports that her symptoms have been off/on since she was seen for bronchitis back in 02/04/18. Reports that she has been taking otc cough medicine or mucinex as needed. She continues symbicort and singulair.  She does not taking an an antihistamine. She is taking dexilant for reflux and reports her symptoms are well controlled on this medication.   Review of Systems See HPI  Past Medical History:  Diagnosis Date  . Allergy   . Asthma   . B12 deficiency   . Back pain   . Bronchitis    hx  . Colon polyp 10/06/2003  . GERD (gastroesophageal reflux disease)   . Hypertension   . Low back pain   . Osteoarthritis   . Sinus trouble      Social History   Socioeconomic History  . Marital status: Single    Spouse name: Not on file  . Number of children: 1  . Years of education: Not on file  . Highest education level: Not on file  Occupational History  . Occupation: Product manager: [employer]  Social Needs  . Financial resource strain: Not on file  . Food insecurity:    Worry: Not on file    Inability: Not on file  . Transportation needs:    Medical: Not on file    Non-medical: Not on file  Tobacco Use  . Smoking status: Never Smoker  . Smokeless tobacco: Never Used  Substance and Sexual Activity  . Alcohol use: No  . Drug use: No  . Sexual activity: Yes    Partners: Male    Birth control/protection: Post-menopausal  Lifestyle  . Physical activity:    Days per week: Not on file    Minutes per session: Not on file  . Stress: Not on file  Relationships  . Social connections:    Talks on phone: Not  on file    Gets together: Not on file    Attends religious service: Not on file    Active member of club or organization: Not on file    Attends meetings of clubs or organizations: Not on file    Relationship status: Not on file  . Intimate partner violence:    Fear of current or ex partner: Not on file    Emotionally abused: Not on file    Physically abused: Not on file    Forced sexual activity: Not on file  Other Topics Concern  . Not on file  Social History Narrative   Divorced   One daughter- lives locally and one grandson   Retired Pharmacist, hospital,  Has masters degree   Enjoys reading, spending time with her grandson    Past Surgical History:  Procedure Laterality Date  . CHOLECYSTECTOMY    . COLONOSCOPY    . COLONOSCOPY W/ BIOPSIES  10/06/2003  . LUMBAR FUSION  2008  . PARTIAL HIP ARTHROPLASTY Right 2009    hip replacement  . TONSILLECTOMY    . TOTAL KNEE ARTHROPLASTY Right 11/16/2013   Procedure: RIGHT TOTAL KNEE ARTHROPLASTY;  Surgeon: Richardson Landry  Ronnie Derby, MD;  Location: [facility];  Service: Orthopedics;  Laterality: Right;  [name] VIDEO BRONCHOSCOPY Bilateral 12/05/2012   Procedure: VIDEO BRONCHOSCOPY WITHOUT FLUORO;  Surgeon: Collene Gobble, MD;  Location: [facility];  Service: Cardiopulmonary;  Laterality: Bilateral;    Family History  Problem Relation Age of Onset  . COPD Father        died at age 2  . Emphysema Father   . Heart Problems Father   . Heart disease Brother        stent with 90%  . COPD Mother   . Heart disease Mother   . Ovarian cancer Mother   . Emphysema Mother   . Hypertension Mother   . Hyperlipidemia Mother   . Heart Problems Mother   . Stroke Mother   . Thyroid disease Mother   . Cancer Mother   . Breast cancer Maternal Aunt   . Colon cancer Neg Hx   . Esophageal cancer Neg Hx   . Rectal cancer Neg Hx   . Stomach cancer Neg Hx     Allergies  Allergen Reactions  . Pantoprazole Sodium Other (See Comments)    Racing heart    Current Outpatient  Medications on File Prior to Visit  Medication Sig Dispense Refill  . amLODipine (NORVASC) 5 MG tablet TAKE 1 TABLET BY MOUTH EVERY DAY 90 tablet 1  . budesonide-formoterol (SYMBICORT) 160-4.5 MCG/ACT inhaler Inhale 2 puffs into the lungs 2 (two) times daily.    . Cholecalciferol (VITAMIN D3) 1.25 MG (50000 UT) CAPS Take 1 Dose by mouth once a week. 4 capsule 0  . DEXILANT 60 MG capsule TAKE 1 CAPSULE BY MOUTH EVERY DAY 90 capsule 1  . diclofenac sodium (VOLTAREN) 1 % GEL APPLY (2G) BY TOPICAL ROUTE 4 TIMES EVERY DAY TO THE AFFECTED AREA(S) 100 g 3  . EPIPEN 2-PAK 0.3 MG/0.3ML SOAJ injection   1  . montelukast (SINGULAIR) 10 MG tablet Take 10 mg by mouth every evening.  5  . Multiple Vitamin (MULTIVITAMIN) capsule Take 1 capsule by mouth daily.      [name] omalizumab (XOLAIR) 150 MG injection Inject 150 mg into the skin every 14 (fourteen) days.    [name] PROAIR RESPICLICK 878 (90 BASE) MCG/ACT AEPB Inhale 2 puffs into the lungs every 6 (six) hours as needed.  0  . traMADol (ULTRAM) 50 MG tablet Take 1 tablet (50 mg total) by mouth every 8 (eight) hours as needed. 30 tablet 0   No current facility-administered medications on file prior to visit.     BP 140/69 (BP Location: Right Arm, Patient Position: Sitting, Cuff Size: Small)   Pulse 86   Temp 98.3 F (36.8 C) (Oral)   Resp 16   Ht 5\' 3"  (1.6 m)   Wt 197 lb 6.4 oz (89.5 kg)   SpO2 97%   BMI 34.97 kg/m       Objective:   Physical Exam Constitutional:      Appearance: She is well-developed.  HENT:     Right Ear: Tympanic membrane and ear canal normal.     Left Ear: Tympanic membrane and ear canal normal.     Mouth/Throat:     Pharynx: No oropharyngeal exudate or posterior oropharyngeal erythema.  Neck:     Musculoskeletal: Neck supple.     Thyroid: No thyromegaly.  Cardiovascular:     Rate and Rhythm: Normal rate and regular rhythm.     Heart sounds: Normal heart sounds. No murmur.  Pulmonary:  Effort: Pulmonary effort is  normal. No respiratory distress.     Breath sounds: Examination of the right-upper field reveals wheezing. Examination of the right-middle field reveals wheezing. Wheezing present.  Skin:    General: Skin is warm and dry.     Comments: Dark approximately 1 to 2 mm wide raised skin lesion noted on left cheek.  Neurological:     Mental Status: She is alert and oriented to person, place, and time.  Psychiatric:        Behavior: Behavior normal.        Thought Content: Thought content normal.        Judgment: Judgment normal.           Assessment & Plan:  Acute asthma exacerbation- chest x-ray was performed today and is negative for pneumonia.  Patient was given an albuterol treatment today in the office.  We will follow this with a prednisone taper.  She is advised to continue Symbicort, Singulair, and her proton pump inhibitor.  She is also advised to call if symptoms worsen or if they fail to improve.  Skin lesion- advised patient that I think dermatology needs to look at this lesion and consider removal.  She is agreeable to this.  She states she already has a dermatologist Dr. Jarome Matin and that she will call to arrange follow-up with him for evaluation of this lesion.

## 2018-04-30 ENCOUNTER — Other Ambulatory Visit: Payer: Self-pay | Admitting: Family

## 2018-05-01 NOTE — Telephone Encounter (Signed)
Last Tramadol RX: 03/27/18, #30 Last OV: 04/28/18 Next OV: 07/23/18 UDS:  CSC: CSR:

## 2018-05-01 NOTE — Telephone Encounter (Signed)
Last RX: 03/17/18 30 tablets Last OV: 04/28/18 Next OV: 07/23/18 UDS:  11/13/17   CSC:  11/13/17 CSR:  Multiple medications, multiple providers, multiple pharmacies please review registry.

## 2018-05-01 NOTE — Telephone Encounter (Signed)
Reviewed registry. Had some cough syrup and 5 day supply of acute pain med from urgent care.  Refill sent.

## 2018-05-08 ENCOUNTER — Ambulatory Visit (INDEPENDENT_AMBULATORY_CARE_PROVIDER_SITE_OTHER): Payer: Medicare Other | Admitting: Family Medicine

## 2018-05-08 ENCOUNTER — Encounter (INDEPENDENT_AMBULATORY_CARE_PROVIDER_SITE_OTHER): Payer: Self-pay | Admitting: Family Medicine

## 2018-05-08 ENCOUNTER — Other Ambulatory Visit: Payer: Self-pay

## 2018-05-08 VITALS — BP 129/69 | HR 67 | Ht 63.0 in | Wt 193.0 lb

## 2018-05-08 DIAGNOSIS — E669 Obesity, unspecified: Secondary | ICD-10-CM

## 2018-05-08 DIAGNOSIS — E559 Vitamin D deficiency, unspecified: Secondary | ICD-10-CM | POA: Diagnosis not present

## 2018-05-08 DIAGNOSIS — Z6834 Body mass index (BMI) 34.0-34.9, adult: Secondary | ICD-10-CM | POA: Diagnosis not present

## 2018-05-08 MED ORDER — VITAMIN D3 1.25 MG (50000 UT) PO CAPS: 1.0000 | ORAL_CAPSULE | ORAL | 0 refills | Status: DC

## 2018-05-08 NOTE — Progress Notes (Signed)
Office: [phone]  /  Fax: [phone]   HPI:   Chief Complaint: OBESITY Felica is here to discuss her progress with her obesity treatment plan. She is keeping a food journal with 1200 calories and 70 grams of protein and is following her eating plan approximately 20 % of the time. She states she is exercising 0 minutes 0 times per week. Alyra is not journaling regularly and has gained weight. She has only lost 2 pounds since starting the program 1 year ago. She was on prednisone recently, which may contribute to her weight gain.  Her weight is 193 lb (87.5 kg) today and has had a weight gain of 1 pound over a period of 4 weeks since her last visit. She has lost 2 lbs since starting treatment with [name].  Vitamin D Deficiency Matilde has a diagnosis of vitamin D deficiency. Her vitamin D level has dropped significantly since last year. She states the vitamin D3 prescription works better than the vitamin D2 prescription. Antoinetta admits fatigue and denies nausea, vomiting, or muscle weakness.  ASSESSMENT AND PLAN:  Vitamin D deficiency - Plan: Cholecalciferol (VITAMIN D3) 1.25 MG (50000 UT) CAPS  Class 1 obesity with serious comorbidity and body mass index (BMI) of 34.0 to 34.9 in adult, unspecified obesity type  PLAN:  Vitamin D Deficiency Lizbett was informed that low vitamin D levels contributes to fatigue and are associated with obesity, breast, and colon cancer. Maylene agrees to continue to take prescription Vit D3 @50 ,000 IU every week #4 with no refills and will follow up for routine testing of vitamin D, at least 2-3 times per year. She was informed of the risk of over-replacement of vitamin D and agrees to not increase her dose unless she discusses this with [name] first. Docia agrees to follow up in 4 weeks as directed.  Obesity Cariana is currently in the action stage of change. As such, her goal is to continue with weight loss efforts. She has agreed to keep a food journal with 1200 calories and  70+ grams of protein.  Racine has been instructed to increase strengthening exercises per week for weight loss and overall health benefits. We discussed the following Behavioral Modification Stratagies today: increasing lean protein intake, better snacking choices, and work on meal planning and easy cooking plans.  Krisanne has agreed to follow up with our clinic in 4 weeks. She was informed of the importance of frequent follow up visits to maximize her success with intensive lifestyle modifications for her multiple health conditions.  ALLERGIES: Allergies  Allergen Reactions  . Pantoprazole Sodium Other (See Comments)    Racing heart    MEDICATIONS: Current Outpatient Medications on File Prior to Visit  Medication Sig Dispense Refill  . amLODipine (NORVASC) 5 MG tablet TAKE 1 TABLET BY MOUTH EVERY DAY 90 tablet 1  . budesonide-formoterol (SYMBICORT) 160-4.5 MCG/ACT inhaler Inhale 2 puffs into the lungs 2 (two) times daily.    [name] DEXILANT 60 MG capsule TAKE 1 CAPSULE BY MOUTH EVERY DAY 90 capsule 1  . diclofenac sodium (VOLTAREN) 1 % GEL APPLY (2G) BY TOPICAL ROUTE 4 TIMES EVERY DAY TO THE AFFECTED AREA(S) 100 g 3  . EPIPEN 2-PAK 0.3 MG/0.3ML SOAJ injection   1  . montelukast (SINGULAIR) 10 MG tablet Take 10 mg by mouth every evening.  5  . Multiple Vitamin (MULTIVITAMIN) capsule Take 1 capsule by mouth daily.      [name] omalizumab (XOLAIR) 150 MG injection Inject 150 mg into the skin  every 14 (fourteen) days.    [name] PROAIR RESPICLICK 397 (90 BASE) MCG/ACT AEPB Inhale 2 puffs into the lungs every 6 (six) hours as needed.  0  . traMADol (ULTRAM) 50 MG tablet TAKE 1 TABLET BY MOUTH EVERY 8 HOURS AS NEEDED 30 tablet 0   No current facility-administered medications on file prior to visit.     PAST MEDICAL HISTORY: Past Medical History:  Diagnosis Date  . Allergy   . Asthma   . B12 deficiency   . Back pain   . Bronchitis    hx  . Colon polyp 10/06/2003  . GERD (gastroesophageal reflux disease)    . Hypertension   . Low back pain   . Osteoarthritis   . Sinus trouble     PAST SURGICAL HISTORY: Past Surgical History:  Procedure Laterality Date  . CHOLECYSTECTOMY    . COLONOSCOPY    . COLONOSCOPY W/ BIOPSIES  10/06/2003  . LUMBAR FUSION  2008  . PARTIAL HIP ARTHROPLASTY Right 2009    hip replacement  . TONSILLECTOMY    . TOTAL KNEE ARTHROPLASTY Right 11/16/2013   Procedure: RIGHT TOTAL KNEE ARTHROPLASTY;  Surgeon: Vickey Huger, MD;  Location: [facility];  Service: Orthopedics;  Laterality: Right;  [name] VIDEO BRONCHOSCOPY Bilateral 12/05/2012   Procedure: VIDEO BRONCHOSCOPY WITHOUT FLUORO;  Surgeon: Collene Gobble, MD;  Location: [facility];  Service: Cardiopulmonary;  Laterality: Bilateral;    SOCIAL HISTORY: Social History   Tobacco Use  . Smoking status: Never Smoker  . Smokeless tobacco: Never Used  Substance Use Topics  . Alcohol use: No  . Drug use: No    FAMILY HISTORY: Family History  Problem Relation Age of Onset  . COPD Father        died at age 25  . Emphysema Father   . Heart Problems Father   . Heart disease Brother        stent with 90%  . COPD Mother   . Heart disease Mother   . Ovarian cancer Mother   . Emphysema Mother   . Hypertension Mother   . Hyperlipidemia Mother   . Heart Problems Mother   . Stroke Mother   . Thyroid disease Mother   . Cancer Mother   . Breast cancer Maternal Aunt   . Colon cancer Neg Hx   . Esophageal cancer Neg Hx   . Rectal cancer Neg Hx   . Stomach cancer Neg Hx     ROS: Review of Systems  Constitutional: Negative for weight loss.  Gastrointestinal: Negative for nausea and vomiting.  Musculoskeletal:       Negative for muscle weakness.    PHYSICAL EXAM: Blood pressure 129/69, pulse 67, height 5\' 3"  (1.6 m), weight 193 lb (87.5 kg), SpO2 97 %. Body mass index is 34.19 kg/m. Physical Exam Vitals signs reviewed.  Constitutional:      Appearance: Normal appearance. She is obese.  Cardiovascular:     Rate  and Rhythm: Normal rate.  Pulmonary:     Effort: Pulmonary effort is normal.  Musculoskeletal: Normal range of motion.  Skin:    General: Skin is warm and dry.  Neurological:     Mental Status: She is alert and oriented to person, place, and time.  Psychiatric:        Mood and Affect: Mood normal.        Behavior: Behavior normal.     RECENT LABS AND TESTS: BMET    Component Value Date/Time   NA  145 (H) 04/10/2018 1031   K 3.9 04/10/2018 1031   CL 108 (H) 04/10/2018 1031   CO2 19 (L) 04/10/2018 1031   GLUCOSE 101 (H) 04/10/2018 1031   GLUCOSE 84 08/14/2017 1324   BUN 12 04/10/2018 1031   CREATININE 0.74 04/10/2018 1031   CALCIUM 9.3 04/10/2018 1031   GFRNONAA 84 04/10/2018 1031   GFRAA 97 04/10/2018 1031   Lab Results  Component Value Date   HGBA1C 6.0 (H) 04/10/2018   HGBA1C 6.3 (H) 04/04/2017   HGBA1C 5.7 (H) 12/03/2016   HGBA1C 6.0 (H) 07/25/2016   HGBA1C 5.8 (H) 04/17/2016   Lab Results  Component Value Date   INSULIN 14.5 04/10/2018   INSULIN 15.6 04/04/2017   INSULIN 13.4 12/03/2016   INSULIN 19.7 07/25/2016   INSULIN 13.9 04/17/2016   CBC    Component Value Date/Time   WBC 8.9 08/14/2017 1324   RBC 4.54 08/14/2017 1324   HGB 12.5 08/14/2017 1324   HGB 12.7 04/17/2016 1150   HCT 38.1 08/14/2017 1324   HCT 39.5 04/17/2016 1150   PLT 309.0 08/14/2017 1324   MCV 83.8 08/14/2017 1324   MCV 84 04/17/2016 1150   MCH 27.1 04/17/2016 1150   MCH 29.3 11/18/2013 0705   MCHC 33.0 08/14/2017 1324   RDW 16.3 (H) 08/14/2017 1324   RDW 16.0 (H) 04/17/2016 1150   LYMPHSABS 3.1 08/14/2017 1324   LYMPHSABS 2.7 04/17/2016 1150   MONOABS 0.5 08/14/2017 1324   EOSABS 0.5 08/14/2017 1324   EOSABS 0.2 04/17/2016 1150   BASOSABS 0.1 08/14/2017 1324   BASOSABS 0.0 04/17/2016 1150   Iron/TIBC/Ferritin/ %Sat No results found for: IRON, TIBC, FERRITIN, IRONPCTSAT Lipid Panel     Component Value Date/Time   CHOL 204 (H) 04/04/2017 0838   TRIG 130 04/04/2017 0838    HDL 62 04/04/2017 0838   CHOLHDL 3 10/24/2015 1054   VLDL 21.6 10/24/2015 1054   LDLCALC 116 (H) 04/04/2017 0838   LDLDIRECT 149.9 09/11/2010 0908   Hepatic Function Panel     Component Value Date/Time   PROT 6.5 04/10/2018 1031   ALBUMIN 4.1 04/10/2018 1031   AST 19 04/10/2018 1031   ALT 18 04/10/2018 1031   ALKPHOS 74 04/10/2018 1031   BILITOT 0.4 04/10/2018 1031   BILIDIR 0.0 10/24/2015 1054      Component Value Date/Time   TSH 1.890 04/17/2016 1150   TSH 1.15 10/24/2015 1054   TSH 1.30 04/25/2015 1132   Results for KARIE, SKOWRON (MRN [id number]) as of 05/08/2018 14:21  Ref. Range 04/10/2018 10:31  Vitamin D, 25-Hydroxy Latest Ref Range: 30.0 - 100.0 ng/mL 28.6 (L)   OBESITY BEHAVIORAL INTERVENTION VISIT  Today's visit was # 23   Starting weight: 195 lbs Starting date: 04/17/16 Today's weight : Weight: 193 lb (87.5 kg)  Today's date: 05/08/2018 Total lbs lost to date: 2 At least 15 minutes were spent on discussing the following behavioral intervention visit.    05/08/2018  Height 5\' 3"  (1.6 m)  Weight 193 lb (87.5 kg)  BMI (Calculated) 34.2  BLOOD PRESSURE - SYSTOLIC 440  BLOOD PRESSURE - DIASTOLIC 69   Body Fat % 10.2 %  Total Body Water (lbs) 75 lbs   ASK: We discussed the diagnosis of obesity with Annetta Maw today and Zahira agreed to give [name] permission to discuss obesity behavioral modification therapy today.  ASSESS: Eufelia has the diagnosis of obesity and her BMI today is 34.2. Kenetha is in the action stage of change.  ADVISE: Sandara was educated on the multiple health risks of obesity as well as the benefit of weight loss to improve her health. She was advised of the need for long term treatment and the importance of lifestyle modifications to improve her current health and to decrease her risk of future health problems.  AGREE: Multiple dietary modification options and treatment options were discussed and Laraya agreed to follow the recommendations  documented in the above note.  ARRANGE: Zen was educated on the importance of frequent visits to treat obesity as outlined per CMS and USPSTF guidelines and agreed to schedule her next follow up appointment today.  IMarcille Blanco, CMA, am acting as transcriptionist for Starlyn Skeans, MD  I have reviewed the above documentation for accuracy and completeness, and I agree with the above. -Dennard Nip, MD

## 2018-05-12 ENCOUNTER — Ambulatory Visit: Payer: Medicare Other | Admitting: Cardiology

## 2018-05-21 ENCOUNTER — Encounter (INDEPENDENT_AMBULATORY_CARE_PROVIDER_SITE_OTHER): Payer: Self-pay

## 2018-05-28 ENCOUNTER — Other Ambulatory Visit: Payer: Self-pay | Admitting: Family

## 2018-06-09 ENCOUNTER — Ambulatory Visit (INDEPENDENT_AMBULATORY_CARE_PROVIDER_SITE_OTHER): Payer: Medicare Other | Admitting: Family Medicine

## 2018-06-10 ENCOUNTER — Other Ambulatory Visit (INDEPENDENT_AMBULATORY_CARE_PROVIDER_SITE_OTHER): Payer: Self-pay | Admitting: Family Medicine

## 2018-06-10 DIAGNOSIS — E559 Vitamin D deficiency, unspecified: Secondary | ICD-10-CM

## 2018-06-13 ENCOUNTER — Encounter: Payer: Self-pay | Admitting: Family

## 2018-06-13 ENCOUNTER — Ambulatory Visit (INDEPENDENT_AMBULATORY_CARE_PROVIDER_SITE_OTHER): Payer: Medicare Other | Admitting: Family

## 2018-06-13 DIAGNOSIS — N3000 Acute cystitis without hematuria: Secondary | ICD-10-CM | POA: Diagnosis not present

## 2018-06-13 MED ORDER — CIPROFLOXACIN HCL 500 MG PO TABS: 500.0000 mg | ORAL_TABLET | Freq: Two times a day (BID) | ORAL | 0 refills | Status: DC

## 2018-06-13 NOTE — Telephone Encounter (Signed)
Spoke with pt and scheduled FaceTime visit for today at 1pm.

## 2018-06-13 NOTE — Progress Notes (Signed)
Virtual Visit via Video Note  I connected with@ on 06/13/18 at  1:00 PM EDT by a video enabled telemedicine application and verified that I am speaking with the correct person using two identifiers. This visit type was conducted due to national recommendations for restrictions regarding the COVID-19 Pandemic (e.g. social distancing).  This format is felt to be most appropriate for this patient at this time.   I discussed the limitations of evaluation and management by telemedicine and the availability of in person appointments. The patient expressed understanding and agreed to proceed.  Only the patient and myself were on today's video visit. The patient was at home and I was in my office at the time of today's visit.   History of Present Illness:  Patient is a 68 yr old female who presents today with chief complaint of flank pain. Pain is located on the right side and has been present for several days.    She reports that she she is only voiding a small amount at a time.  Pain is beneath her right posterior rib cage and down. She denies associated fever or dysuria. No alleviating factors  Reports pain is not as severe as it was back in June when she possibly passed a stone.     Observations/Objective:   Gen: Awake, alert, no acute distress Resp: Breathing is even and non-labored Psych: calm/pleasant demeanor Neuro: Alert and Oriented x 3, + facial symmetry, speech is clear.  Assessment and Plan:  UTI- will rx with cipro bid to cover empirically for pyelonephritis. Clinically doubt stone but pt is advised to go to the ER if she develops fever >101 or increased pain after beginning cipro. She would need further imaging/evaluation if that occurs. She verbalizes understanding.  Follow Up Instructions:    I discussed the assessment and treatment plan with the patient. The patient was provided an opportunity to ask questions and all were answered. The patient agreed with the plan and  demonstrated an understanding of the instructions.   The patient was advised to call back or seek an in-person evaluation if the symptoms worsen or if the condition fails to improve as anticipated.    Nance Pear, NP

## 2018-06-18 ENCOUNTER — Ambulatory Visit: Payer: Medicare Other | Admitting: Cardiology

## 2018-06-28 ENCOUNTER — Other Ambulatory Visit: Payer: Self-pay | Admitting: Family

## 2018-07-15 ENCOUNTER — Other Ambulatory Visit: Payer: Self-pay | Admitting: Family

## 2018-07-17 ENCOUNTER — Telehealth: Payer: Self-pay | Admitting: *Deleted

## 2018-07-17 NOTE — Telephone Encounter (Signed)
Attempted to contact pt to r/s appt below and left detailed message on voicemail to return my call to R/S.   Copied from [place] [phone]. Topic: Appointment Scheduling - Scheduling Inquiry for Clinic >> Jul 16, 2018  4:52 PM Erick Blinks wrote: Reason for CRM: Pt called to reschedule 09/15/2018 appt after office closed. She will have hip surgery 09/10/2018 and needs to reschedule for anytime between a week prior to surgery date.

## 2018-07-17 NOTE — Telephone Encounter (Signed)
DONE

## 2018-07-23 ENCOUNTER — Ambulatory Visit: Payer: Medicare Other | Admitting: Family

## 2018-07-25 ENCOUNTER — Other Ambulatory Visit: Payer: Self-pay | Admitting: Family

## 2018-07-30 ENCOUNTER — Encounter: Payer: Self-pay | Admitting: Cardiology

## 2018-07-30 ENCOUNTER — Other Ambulatory Visit: Payer: Self-pay

## 2018-07-30 ENCOUNTER — Ambulatory Visit (INDEPENDENT_AMBULATORY_CARE_PROVIDER_SITE_OTHER): Payer: Medicare Other | Admitting: Cardiology

## 2018-07-30 VITALS — BP 144/74 | HR 78 | Ht 63.0 in | Wt 201.0 lb

## 2018-07-30 DIAGNOSIS — I10 Essential (primary) hypertension: Secondary | ICD-10-CM

## 2018-07-30 DIAGNOSIS — R7303 Prediabetes: Secondary | ICD-10-CM

## 2018-07-30 DIAGNOSIS — R0789 Other chest pain: Secondary | ICD-10-CM

## 2018-07-30 DIAGNOSIS — Z96651 Presence of right artificial knee joint: Secondary | ICD-10-CM | POA: Diagnosis not present

## 2018-07-30 NOTE — Patient Instructions (Signed)
Medication Instructions:  .isntcur  If you need a refill on your cardiac medications before your next appointment, please call your pharmacy.   Lab work: None.  If you have labs (blood work) drawn today and your tests are completely normal, you will receive your results only by: [name] MyChart Message (if you have MyChart) OR . A paper copy in the mail If you have any lab test that is abnormal or we need to change your treatment, we will call you to review the results.  Testing/Procedures: Your physician has requested that you have an echocardiogram. Echocardiography is a painless test that uses sound waves to create images of your heart. It provides your doctor with information about the size and shape of your heart and how well your heart's chambers and valves are working. This procedure takes approximately one hour. There are no restrictions for this procedure.    Follow-Up: At [facility], you and your health needs are our priority.  As part of our continuing mission to provide you with exceptional heart care, we have created designated Provider Care Teams.  These Care Teams include your primary Cardiologist (physician) and Advanced Practice Providers (APPs -  Physician Assistants and Nurse Practitioners) who all work together to provide you with the care you need, when you need it. You will need a follow up appointment in 6 months.  Please call our office 2 months in advance to schedule this appointment.  You may see No primary care provider on file. or another member of our [employer] Provider Team in [place]: Shirlee More, MD . Jyl Heinz, MD  Any Other Special Instructions Will Be Listed Below (If Applicable).   Echocardiogram An echocardiogram is a procedure that uses painless sound waves (ultrasound) to produce an image of the heart. Images from an echocardiogram can provide important information about:  Signs of coronary artery disease (CAD).  Aneurysm detection. An  aneurysm is a weak or damaged part of an artery wall that bulges out from the normal force of blood pumping through the body.  Heart size and shape. Changes in the size or shape of the heart can be associated with certain conditions, including heart failure, aneurysm, and CAD.  Heart muscle function.  Heart valve function.  Signs of a past heart attack.  Fluid buildup around the heart.  Thickening of the heart muscle.  A tumor or infectious growth around the heart valves. Tell a health care provider about:  Any allergies you have.  All medicines you are taking, including vitamins, herbs, eye drops, creams, and over-the-counter medicines.  Any blood disorders you have.  Any surgeries you have had.  Any medical conditions you have.  Whether you are pregnant or may be pregnant. What are the risks? Generally, this is a safe procedure. However, problems may occur, including:  Allergic reaction to dye (contrast) that may be used during the procedure. What happens before the procedure? No specific preparation is needed. You may eat and drink normally. What happens during the procedure?   An IV tube may be inserted into one of your veins.  You may receive contrast through this tube. A contrast is an injection that improves the quality of the pictures from your heart.  A gel will be applied to your chest.  A wand-like tool (transducer) will be moved over your chest. The gel will help to transmit the sound waves from the transducer.  The sound waves will harmlessly bounce off of your heart to allow the heart  images to be captured in real-time motion. The images will be recorded on a computer. The procedure may vary among health care providers and hospitals. What happens after the procedure?  You may return to your normal, everyday life, including diet, activities, and medicines, unless your health care provider tells you not to do that. Summary  An echocardiogram is a  procedure that uses painless sound waves (ultrasound) to produce an image of the heart.  Images from an echocardiogram can provide important information about the size and shape of your heart, heart muscle function, heart valve function, and fluid buildup around your heart.  You do not need to do anything to prepare before this procedure. You may eat and drink normally.  After the echocardiogram is completed, you may return to your normal, everyday life, unless your health care provider tells you not to do that. This information is not intended to replace advice given to you by your health care provider. Make sure you discuss any questions you have with your health care provider. Document Released: 02/10/2000 Document Revised: 03/17/2016 Document Reviewed: 03/17/2016 Elsevier Interactive Patient Education  2019 [employer].

## 2018-07-30 NOTE — Progress Notes (Signed)
Cardiology Office Note:    Date:  07/30/2018   ID:  NASIAH Dunlap, DOB 1950-08-09, MRN [id number]  PCP:  [name], NP  Cardiologist:  Jenne Campus, MD    Referring MD: [name], NP   Chief Complaint  Patient presents with  . Pre-op Exam    09/10/2018, Left Hip Replacement  I need left hip replacement  History of Present Illness:    Brandy Dunlap is a 68 y.o. female I seen her about a year ago for atypical chest pain.  We end up doing stress that she did quite well on the treadmill stress test was negative for exercise-induced myocardial ischemia.  She does have risk factors for coronary artery disease which include hypertension as well as borderline diabetes.  She is getting ready to have left hip replacement done and she came to talk about it.  She denies having any chest pain tightness squeezing pressure burning chest she is try to walk on a regular basis in spite of her problem with her hip she is able to walk with her friends with her family and keep up with them.  Denies have any cardiac complaints during the time.  She can do 5 flaps.  After about 3 laps she had to stop and stretch her hip and leg otherwise she got no issues.  Past Medical History:  Diagnosis Date  . Allergy   . Asthma   . B12 deficiency   . Back pain   . Bronchitis    hx  . Colon polyp 10/06/2003  . GERD (gastroesophageal reflux disease)   . Hypertension   . Low back pain   . Osteoarthritis   . Sinus trouble     Past Surgical History:  Procedure Laterality Date  . CHOLECYSTECTOMY    . COLONOSCOPY    . COLONOSCOPY W/ BIOPSIES  10/06/2003  . LUMBAR FUSION  2008  . PARTIAL HIP ARTHROPLASTY Right 2009    hip replacement  . TONSILLECTOMY    . TOTAL KNEE ARTHROPLASTY Right 11/16/2013   Procedure: RIGHT TOTAL KNEE ARTHROPLASTY;  Surgeon: Vickey Huger, MD;  Location: [place];  Service: Orthopedics;  Laterality: Right;  [name] VIDEO BRONCHOSCOPY Bilateral 12/05/2012   Procedure: VIDEO  BRONCHOSCOPY WITHOUT FLUORO;  Surgeon: Collene Gobble, MD;  Location: [facility];  Service: Cardiopulmonary;  Laterality: Bilateral;    Current Medications: Current Meds  Medication Sig  . amLODipine (NORVASC) 5 MG tablet TAKE 1 TABLET BY MOUTH EVERY DAY  . budesonide-formoterol (SYMBICORT) 160-4.5 MCG/ACT inhaler Inhale 2 puffs into the lungs 2 (two) times daily.  . Cholecalciferol (VITAMIN D3) 1.25 MG (50000 UT) CAPS Take 1 Dose by mouth once a week.  . DEXILANT 60 MG capsule TAKE 1 CAPSULE BY MOUTH EVERY DAY  . EPIPEN 2-PAK 0.3 MG/0.3ML SOAJ injection   . Multiple Vitamin (MULTIVITAMIN) capsule Take 1 capsule by mouth daily.    [name] omalizumab (XOLAIR) 150 MG injection Inject 150 mg into the skin every 14 (fourteen) days.     Allergies:   Pantoprazole sodium   Social History   Socioeconomic History  . Marital status: Single    Spouse name: Not on file  . Number of children: 1  . Years of education: Not on file  . Highest education level: Not on file  Occupational History  . Occupation: Product manager: [employer]  Social Needs  . Financial resource strain: Not on file  . Food insecurity:    Worry:  Not on file    Inability: Not on file  . Transportation needs:    Medical: Not on file    Non-medical: Not on file  Tobacco Use  . Smoking status: Never Smoker  . Smokeless tobacco: Never Used  Substance and Sexual Activity  . Alcohol use: No  . Drug use: No  . Sexual activity: Yes    Partners: Male    Birth control/protection: Post-menopausal  Lifestyle  . Physical activity:    Days per week: Not on file    Minutes per session: Not on file  . Stress: Not on file  Relationships  . Social connections:    Talks on phone: Not on file    Gets together: Not on file    Attends religious service: Not on file    Active member of club or organization: Not on file    Attends meetings of clubs or organizations: Not on file    Relationship status: Not on file   Other Topics Concern  . Not on file  Social History Narrative   Divorced   One daughter- lives locally and one grandson   Retired Pharmacist, hospital,  Has masters degree   Enjoys reading, spending time with her grandson     Family History: The patient's family history includes Breast cancer in her maternal aunt; COPD in her father and mother; Cancer in her mother; Emphysema in her father and mother; Heart Problems in her father and mother; Heart disease in her brother and mother; Hyperlipidemia in her mother; Hypertension in her mother; Ovarian cancer in her mother; Stroke in her mother; Thyroid disease in her mother. There is no history of Colon cancer, Esophageal cancer, Rectal cancer, or Stomach cancer. ROS:   Please see the history of present illness.    All 14 point review of systems negative except as described per history of present illness  EKGs/Labs/Other Studies Reviewed:      Recent Labs: 08/14/2017: Hemoglobin 12.5; Platelets 309.0 04/10/2018: ALT 18; BUN 12; Creatinine, Ser 0.74; Potassium 3.9; Sodium 145  Recent Lipid Panel    Component Value Date/Time   CHOL 204 (H) 04/04/2017 0838   TRIG 130 04/04/2017 0838   HDL 62 04/04/2017 0838   CHOLHDL 3 10/24/2015 1054   VLDL 21.6 10/24/2015 1054   LDLCALC 116 (H) 04/04/2017 0838   LDLDIRECT 149.9 09/11/2010 0908    Physical Exam:    VS:  BP (!) 144/74   Pulse 78   Ht 5\' 3"  (1.6 m)   Wt 201 lb (91.2 kg)   SpO2 98%   BMI 35.61 kg/m     Wt Readings from Last 3 Encounters:  07/30/18 201 lb (91.2 kg)  05/08/18 193 lb (87.5 kg)  04/28/18 197 lb 6.4 oz (89.5 kg)     GEN:  Well nourished, well developed in no acute distress HEENT: Normal NECK: No JVD; No carotid bruits LYMPHATICS: No lymphadenopathy CARDIAC: RRR, no murmurs, no rubs, no gallops RESPIRATORY:  Clear to auscultation without rales, wheezing or rhonchi  ABDOMEN: Soft, non-tender, non-distended MUSCULOSKELETAL:  No edema; No deformity  SKIN: Warm and dry  LOWER EXTREMITIES: no swelling NEUROLOGIC:  Alert and oriented x 3 PSYCHIATRIC:  Normal affect   ASSESSMENT:    1. Atypical chest pain   2. Prediabetes   3. Status post total right knee replacement   4. Essential hypertension    PLAN:    In order of problems listed above:  1. Cardiovascular preop evaluation.  She appears to be  doing quite well especially with exercises.  Her EKG however today showed worsening of incomplete right bundle branch block.  I will schedule her to have echocardiogram to assess left ventricular ejection fraction before surgery, however, I doubt very much will discover something that would prevent [name] from proceeding with surgery. 2. Prediabetes she is very active with her exercises I will I encouraged her to continue doing that. 3. Status post total knee on the right.  Noted 4. Essential hypertension blood pressure well controlled.   Medication Adjustments/Labs and Tests Ordered: Current medicines are reviewed at length with the patient today.  Concerns regarding medicines are outlined above.  No orders of the defined types were placed in this encounter.  Medication changes: No orders of the defined types were placed in this encounter.   Signed, Park Liter, MD, [facility] 07/30/2018 10:58 AM    [place]

## 2018-07-30 NOTE — Addendum Note (Signed)
Addended by: Linna Hoff R on: 07/30/2018 11:10 AM   Modules accepted: Orders

## 2018-07-30 NOTE — Addendum Note (Signed)
Addended by: Linna Hoff R on: 07/30/2018 11:05 AM   Modules accepted: Orders

## 2018-08-04 ENCOUNTER — Telehealth: Payer: Medicare Other | Admitting: Cardiology

## 2018-08-08 ENCOUNTER — Other Ambulatory Visit: Payer: Self-pay

## 2018-08-08 ENCOUNTER — Ambulatory Visit (HOSPITAL_BASED_OUTPATIENT_CLINIC_OR_DEPARTMENT_OTHER)
Admission: RE | Admit: 2018-08-08 | Discharge: 2018-08-08 | Disposition: A | Discharge status: Home / Self Care | Payer: Medicare Other | Source: Ambulatory Visit | Attending: Cardiology | Admitting: Cardiology

## 2018-08-08 DIAGNOSIS — R0789 Other chest pain: Secondary | ICD-10-CM | POA: Insufficient documentation

## 2018-08-08 NOTE — Progress Notes (Signed)
  Echocardiogram 2D Echocardiogram has been performed.  Brandy Dunlap 08/08/2018, 11:55 AM

## 2018-08-11 ENCOUNTER — Telehealth: Payer: Self-pay | Admitting: Emergency Medicine

## 2018-08-11 NOTE — Telephone Encounter (Signed)
Left message for patient to return call to go over echo results.

## 2018-08-13 ENCOUNTER — Inpatient Hospital Stay (HOSPITAL_COMMUNITY): Admission: RE | Admit: 2018-08-13 | Payer: Medicare Other | Source: Home / Self Care | Admitting: Orthopedic Surgery

## 2018-08-13 ENCOUNTER — Encounter (HOSPITAL_COMMUNITY): Admission: RE | Payer: Self-pay | Source: Home / Self Care

## 2018-08-13 SURGERY — ARTHROPLASTY, HIP, TOTAL, ANTERIOR APPROACH
Anesthesia: Choice | Laterality: Left

## 2018-08-19 ENCOUNTER — Other Ambulatory Visit: Payer: Self-pay

## 2018-08-19 ENCOUNTER — Ambulatory Visit (INDEPENDENT_AMBULATORY_CARE_PROVIDER_SITE_OTHER): Payer: Medicare Other | Admitting: Family

## 2018-08-19 ENCOUNTER — Encounter: Payer: Self-pay | Admitting: Family

## 2018-08-19 DIAGNOSIS — J302 Other seasonal allergic rhinitis: Secondary | ICD-10-CM | POA: Diagnosis not present

## 2018-08-19 MED ORDER — LORATADINE 10 MG PO TABS: 10.0000 mg | ORAL_TABLET | Freq: Every day | ORAL | 11 refills | Status: DC

## 2018-08-19 MED ORDER — FLUTICASONE PROPIONATE 50 MCG/ACT NA SUSP: 2.0000 | Freq: Every day | NASAL | 6 refills | Status: DC

## 2018-08-19 MED ORDER — BENZONATATE 100 MG PO CAPS: 100.0000 mg | ORAL_CAPSULE | Freq: Three times a day (TID) | ORAL | 0 refills | Status: DC | PRN

## 2018-08-19 NOTE — Progress Notes (Signed)
Subjective:    Patient ID: Brandy Dunlap, female    DOB: 1950-08-26, 68 y.o.   MRN: [id number]  HPI  Patient presents today with c/o sinus pain, ear pain, "terrible drainage" with a hacking cough.  Reports that she has been using nyquil and alkaselzer to help her sleep some. Reports that symptoms started a few days ago.  She is not taking an antihistamine. Reports + sneezing.  Denies fever. Denies body aches.   BP Readings from Last 3 Encounters:  07/30/18 (!) 144/74  05/08/18 129/69  04/28/18 140/69    Review of Systems See HPI    Past Medical History:  Diagnosis Date  . Allergy   . Asthma   . B12 deficiency   . Back pain   . Bronchitis    hx  . Colon polyp 10/06/2003  . GERD (gastroesophageal reflux disease)   . Hypertension   . Low back pain   . Osteoarthritis   . Sinus trouble      Social History   Socioeconomic History  . Marital status: Single    Spouse name: Not on file  . Number of children: 1  . Years of education: Not on file  . Highest education level: Not on file  Occupational History  . Occupation: Product manager: [employer]  Social Needs  . Financial resource strain: Not on file  . Food insecurity    Worry: Not on file    Inability: Not on file  . Transportation needs    Medical: Not on file    Non-medical: Not on file  Tobacco Use  . Smoking status: Never Smoker  . Smokeless tobacco: Never Used  Substance and Sexual Activity  . Alcohol use: No  . Drug use: No  . Sexual activity: Yes    Partners: Male    Birth control/protection: Post-menopausal  Lifestyle  . Physical activity    Days per week: Not on file    Minutes per session: Not on file  . Stress: Not on file  Relationships  . Social Herbalist on phone: Not on file    Gets together: Not on file    Attends religious service: Not on file    Active member of club or organization: Not on file    Attends meetings of clubs or organizations: Not on  file    Relationship status: Not on file  . Intimate partner violence    Fear of current or ex partner: Not on file    Emotionally abused: Not on file    Physically abused: Not on file    Forced sexual activity: Not on file  Other Topics Concern  . Not on file  Social History Narrative   Divorced   One daughter- lives locally and one grandson   Retired Pharmacist, hospital,  Has masters degree   Enjoys reading, spending time with her grandson    Past Surgical History:  Procedure Laterality Date  . CHOLECYSTECTOMY    . COLONOSCOPY    . COLONOSCOPY W/ BIOPSIES  10/06/2003  . LUMBAR FUSION  2008  . PARTIAL HIP ARTHROPLASTY Right 2009    hip replacement  . TONSILLECTOMY    . TOTAL KNEE ARTHROPLASTY Right 11/16/2013   Procedure: RIGHT TOTAL KNEE ARTHROPLASTY;  Surgeon: Vickey Huger, MD;  Location: [place];  Service: Orthopedics;  Laterality: Right;  [name] VIDEO BRONCHOSCOPY Bilateral 12/05/2012   Procedure: VIDEO BRONCHOSCOPY WITHOUT FLUORO;  Surgeon: Collene Gobble, MD;  Location: [facility];  Service: Cardiopulmonary;  Laterality: Bilateral;    Family History  Problem Relation Age of Onset  . COPD Father        died at age 53  . Emphysema Father   . Heart Problems Father   . Heart disease Brother        stent with 90%  . COPD Mother   . Heart disease Mother   . Ovarian cancer Mother   . Emphysema Mother   . Hypertension Mother   . Hyperlipidemia Mother   . Heart Problems Mother   . Stroke Mother   . Thyroid disease Mother   . Cancer Mother   . Breast cancer Maternal Aunt   . Colon cancer Neg Hx   . Esophageal cancer Neg Hx   . Rectal cancer Neg Hx   . Stomach cancer Neg Hx     Allergies  Allergen Reactions  . Pantoprazole Sodium Other (See Comments)    Racing heart    Current Outpatient Medications on File Prior to Visit  Medication Sig Dispense Refill  . amLODipine (NORVASC) 5 MG tablet TAKE 1 TABLET BY MOUTH EVERY DAY 90 tablet 1  . budesonide-formoterol (SYMBICORT)  160-4.5 MCG/ACT inhaler Inhale 2 puffs into the lungs 2 (two) times daily.    . Cholecalciferol (VITAMIN D3) 1.25 MG (50000 UT) CAPS Take 1 Dose by mouth once a week. 4 capsule 0  . DEXILANT 60 MG capsule TAKE 1 CAPSULE BY MOUTH EVERY DAY 90 capsule 1  . EPIPEN 2-PAK 0.3 MG/0.3ML SOAJ injection   1  . Multiple Vitamin (MULTIVITAMIN) capsule Take 1 capsule by mouth daily.      [name] omalizumab (XOLAIR) 150 MG injection Inject 150 mg into the skin every 14 (fourteen) days.     No current facility-administered medications on file prior to visit.     There were no vitals taken for this visit.   Objective:   Physical Exam  Gen: Awake, alert, no acute distress Resp: Breathing is even and non-labored Psych: calm/pleasant demeanor Neuro: Alert and Oriented x 3, + facial symmetry, speech is clear.        Assessment & Plan:  Allergic rhinitis-advised patient to discontinue Alka-Seltzer.  Add Claritin 10 mg once daily as well as Flonase 2 sprays each nostril once daily.  Can rinse sinuses with nasal saline prior to Flonase application.  A prescription has been sent for Tessalon as needed for cough.  She is advised to call if new or worsening symptoms or if she develops fever.  Also advised to call if symptoms fail to improve in the next 3 days or so.

## 2018-08-23 ENCOUNTER — Other Ambulatory Visit: Payer: Self-pay | Admitting: Family

## 2018-08-26 ENCOUNTER — Encounter: Payer: Self-pay | Admitting: Family

## 2018-09-01 NOTE — H&P (Signed)
TOTAL HIP ADMISSION H&P  Patient is admitted for left total hip arthroplasty.  Subjective:  Chief Complaint: left hip pain  HPI: Brandy Dunlap, 68 y.o. female, has a history of pain and functional disability in the left hip(s) due to arthritis and patient has failed non-surgical conservative treatments for greater than 12 weeks to include corticosteriod injections and activity modification.  Onset of symptoms was gradual starting 6 months years ago with gradually worsening course since that time.The patient noted no past surgery on the left hip(s).  Patient currently rates pain in the left hip at 8 out of 10 with activity. Patient has worsening of pain with activity and weight bearing and pain that interfers with activities of daily living. Patient has evidence of subchondral cysts and joint space narrowing by imaging studies. This condition presents safety issues increasing the risk of falls. There is no current active infection.  Patient Active Problem List   Diagnosis Date Noted  . Atypical chest pain 04/18/2017  . Depression 10/01/2016  . Prediabetes 07/09/2016  . Class 1 obesity with serious comorbidity and body mass index (BMI) of 31.0 to 31.9 in adult 05/23/2015  . Morbid obesity ([place]) 04/25/2015  . Preventative health care 10/25/2014  . Asthma 10/08/2014  . S/P total knee arthroplasty 11/16/2013  . 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 Cough 06/26/2012  . B12 deficiency 09/21/2009  . Allergic rhinitis 06/29/2009  . SYMPTOMATIC MENOPAUSAL/FEMALE CLIMACTERIC STATES 09/06/2008  . Essential hypertension 10/08/2007  . OSTEOARTHRITIS 10/08/2007  . GERD 07/24/2007  . LOW BACK PAIN 07/24/2007   Past Medical History:  Diagnosis Date  . Allergy   . Asthma   . B12 deficiency   . Back pain   . Bronchitis    hx  . Colon polyp 10/06/2003  . GERD (gastroesophageal reflux disease)   . Hypertension   . Low back pain   . Osteoarthritis   . Sinus trouble     Past Surgical History:   Procedure Laterality Date  . CHOLECYSTECTOMY    . COLONOSCOPY    . COLONOSCOPY W/ BIOPSIES  10/06/2003  . LUMBAR FUSION  2008  . PARTIAL HIP ARTHROPLASTY Right 2009    hip replacement  . TONSILLECTOMY    . TOTAL KNEE ARTHROPLASTY Right 11/16/2013   Procedure: RIGHT TOTAL KNEE ARTHROPLASTY;  Surgeon: Vickey Huger, MD;  Location: [place];  Service: Orthopedics;  Laterality: Right;  [name] VIDEO BRONCHOSCOPY Bilateral 12/05/2012   Procedure: VIDEO BRONCHOSCOPY WITHOUT FLUORO;  Surgeon: Collene Gobble, MD;  Location[facility];  Service: Cardiopulmonary;  Laterality: Bilateral;    No current facility-administered medications for this encounter.    Current Outpatient Medications  Medication Sig Dispense Refill Last Dose  . amLODipine (NORVASC) 5 MG tablet TAKE 1 TABLET BY MOUTH EVERY DAY (Patient taking differently: Take 5 mg by mouth daily. ) 90 tablet 1   . budesonide-formoterol (SYMBICORT) 160-4.5 MCG/ACT inhaler Inhale 2 puffs into the lungs 2 (two) times daily.     [name] DEXILANT 60 MG capsule TAKE 1 CAPSULE BY MOUTH EVERY DAY (Patient taking differently: Take 60 mg by mouth daily. ) 90 capsule 1   . fluticasone (FLONASE) 50 MCG/ACT nasal spray Place 2 sprays into both nostrils daily. 16 g 6   . montelukast (SINGULAIR) 10 MG tablet Take 10 mg by mouth at bedtime.     [name] omalizumab (XOLAIR) 150 MG injection Inject 150 mg into the skin every 14 (fourteen) days.     . benzonatate (TESSALON) 100 MG capsule  Take 1 capsule (100 mg total) by mouth 3 (three) times daily as needed. (Patient not taking: Reported on 09/01/2018) 20 capsule 0 Not Taking at Unknown time  . Cholecalciferol (VITAMIN D3) 1.25 MG (50000 UT) CAPS Take 1 Dose by mouth once a week. (Patient not taking: Reported on 09/01/2018) 4 capsule 0 Not Taking at Unknown time  . loratadine (CLARITIN) 10 MG tablet Take 1 tablet (10 mg total) by mouth daily. (Patient not taking: Reported on 09/01/2018) 30 tablet 11 Not Taking at Unknown time   Allergies   Allergen Reactions  . Pantoprazole Sodium Palpitations    Social History   Tobacco Use  . Smoking status: Never Smoker  . Smokeless tobacco: Never Used  Substance Use Topics  . Alcohol use: No    Family History  Problem Relation Age of Onset  . COPD Father        died at age 34  . Emphysema Father   . Heart Problems Father   . Heart disease Brother        stent with 90%  . COPD Mother   . Heart disease Mother   . Ovarian cancer Mother   . Emphysema Mother   . Hypertension Mother   . Hyperlipidemia Mother   . Heart Problems Mother   . Stroke Mother   . Thyroid disease Mother   . Cancer Mother   . Breast cancer Maternal Aunt   . Colon cancer Neg Hx   . Esophageal cancer Neg Hx   . Rectal cancer Neg Hx   . Stomach cancer Neg Hx      Review of Systems  Constitutional: Negative for chills and fever.  Respiratory: Negative for cough and shortness of breath.   Cardiovascular: Negative for chest pain and palpitations.  Gastrointestinal: Negative for nausea and vomiting.  Musculoskeletal: Positive for joint pain.  Neurological: Negative for tingling.   Objective:  Physical Exam Patient is a 68 year old female. Well nourished and well developed. General: Alert and oriented x3, cooperative and pleasant, no acute distress. Head: normocephalic, atraumatic, neck supple. Eyes: EOMI. Respiratory: breath sounds clear in all fields, no wheezing, rales, or rhonchi. Cardiovascular: Regular rate and rhythm, no murmurs, gallops or rubs. Abdomen: non-tender to palpation and soft, normoactive bowel sounds. Musculoskeletal:  Left Hip Exam: ROM: Flexion to 100, Internal Rotation is minimal, External Rotation 20, and Abduction 20-30 degrees with pain and discomfort. There is slight tenderness over the greater trochanter bursa. Palpation does not reproduce her pain. There is no pain on provocative testing of the hip.  Calves soft and nontender. Motor function intact in LE. Strength  5/5 LE bilaterally. Neuro: Distal pulses 2+. Sensation to light touch intact in LE.  Vital signs in last 24 hours: BP: 134/88 mmHg Pulse: 72 bpm  Labs:   Estimated body mass index is 35.61 kg/m as calculated from the following:   Height as of 07/30/18: 5\' 3"  (1.6 m).   Weight as of 07/30/18: 91.2 kg.   Imaging Review Plain radiographs demonstrate severe degenerative joint disease of the left hip(s). The bone quality appears to be adequate for age and reported activity level.  Assessment/Plan:  End stage arthritis, left hip(s)  The patient history, physical examination, clinical judgement of the provider and imaging studies are consistent with end stage degenerative joint disease of the left hip(s) and total hip arthroplasty is deemed medically necessary. The treatment options including medical management, injection therapy, arthroscopy and arthroplasty were discussed at length. The risks and benefits of  total hip arthroplasty were presented and reviewed. The risks due to aseptic loosening, infection, stiffness, dislocation/subluxation,  thromboembolic complications and other imponderables were discussed.  The patient acknowledged the explanation, agreed to proceed with the plan and consent was signed. Patient is being admitted for inpatient treatment for surgery, pain control, PT, OT, prophylactic antibiotics, VTE prophylaxis, progressive ambulation and ADL's and discharge planning.The patient is planning to be discharged home.  Therapy Plans: HEP Disposition: Home with daughter Planned DVT Prophylaxis: Aspirin 325 mg BID DME needed: None PCP: Lemar Livings, NP Cardiologist: Jenne Campus, MD TXA: IV Allergies: NKDA Anesthesia Concerns: None BMI: 35.3  - Patient was instructed on what medications to stop prior to surgery. - Follow-up visit in 2 weeks with Dr. Wynelle Link - Begin physical therapy following surgery - Pre-operative lab work as pre-surgical testing - Prescriptions  will be provided in hospital at time of discharge  Griffith Citron, PA-C Orthopedic Surgery [facility] [place]

## 2018-09-02 ENCOUNTER — Ambulatory Visit: Payer: Medicare Other | Admitting: Family

## 2018-09-02 ENCOUNTER — Other Ambulatory Visit: Payer: Self-pay

## 2018-09-02 ENCOUNTER — Ambulatory Visit (HOSPITAL_BASED_OUTPATIENT_CLINIC_OR_DEPARTMENT_OTHER)
Admission: RE | Admit: 2018-09-02 | Discharge: 2018-09-02 | Disposition: A | Discharge status: Home / Self Care | Payer: Medicare Other | Source: Ambulatory Visit | Attending: Family | Admitting: Family

## 2018-09-02 ENCOUNTER — Encounter: Payer: Self-pay | Admitting: Family

## 2018-09-02 VITALS — BP 128/59 | HR 80 | Temp 98.5°F | Resp 16 | Ht 63.0 in | Wt 199.4 lb

## 2018-09-02 DIAGNOSIS — Z01818 Encounter for other preprocedural examination: Secondary | ICD-10-CM

## 2018-09-02 DIAGNOSIS — R109 Unspecified abdominal pain: Secondary | ICD-10-CM | POA: Diagnosis not present

## 2018-09-02 DIAGNOSIS — E559 Vitamin D deficiency, unspecified: Secondary | ICD-10-CM

## 2018-09-02 MED ORDER — CEPHALEXIN 500 MG PO CAPS: 500.0000 mg | ORAL_CAPSULE | Freq: Two times a day (BID) | ORAL | 0 refills | Status: DC

## 2018-09-02 NOTE — Patient Instructions (Signed)
Please complete lab work prior to leaving. Complete x-ray on the first floor. Start Keflex for possible urinary tract infection.

## 2018-09-02 NOTE — Progress Notes (Signed)
Subjective:    Patient ID: Brandy Dunlap, female    DOB: 1950-12-19, 68 y.o.   MRN: [id number]  HPI   Patient is a 68 yr old female who presents today for pre-op evaluation for L THA.  She has already completed a cardiac evaluation. Reports that she is really having a lot of left sided hip pain.  Vit D deficiency- ran out of vit D 50000 several months ago. Requests refill.  Right sided flank pain- reports an associated urinary urgency/pressure.    Review of Systems  Constitutional: Negative for unexpected weight change.  HENT: Negative for rhinorrhea.   Respiratory: Negative for shortness of breath.        Reports some mild cough at night which she attributes to dust allergy  Cardiovascular: Negative for chest pain.  Gastrointestinal: Negative for constipation and diarrhea.  Genitourinary: Negative for dysuria.       Some urinary pressure and some right sided lower back, not worse with movement- pain is worst when she gets up in the AM.   Musculoskeletal: Positive for arthralgias.  Skin: Negative for rash.  Neurological: Negative for headaches.  Hematological: Negative for adenopathy.  Psychiatric/Behavioral:       Denies depression/anxiety       Past Medical History:  Diagnosis Date  . Allergy   . Asthma   . B12 deficiency   . Back pain   . Bronchitis    hx  . Colon polyp 10/06/2003  . GERD (gastroesophageal reflux disease)   . Hypertension   . Low back pain   . Osteoarthritis   . Sinus trouble      Social History   Socioeconomic History  . Marital status: Single    Spouse name: Not on file  . Number of children: 1  . Years of education: Not on file  . Highest education level: Not on file  Occupational History  . Occupation: Product manager: [employer]  Social Needs  . Financial resource strain: Not on file  . Food insecurity    Worry: Not on file    Inability: Not on file  . Transportation needs    Medical: Not on file   Non-medical: Not on file  Tobacco Use  . Smoking status: Never Smoker  . Smokeless tobacco: Never Used  Substance and Sexual Activity  . Alcohol use: No  . Drug use: No  . Sexual activity: Yes    Partners: Male    Birth control/protection: Post-menopausal  Lifestyle  . Physical activity    Days per week: Not on file    Minutes per session: Not on file  . Stress: Not on file  Relationships  . Social Herbalist on phone: Not on file    Gets together: Not on file    Attends religious service: Not on file    Active member of club or organization: Not on file    Attends meetings of clubs or organizations: Not on file    Relationship status: Not on file  . Intimate partner violence    Fear of current or ex partner: Not on file    Emotionally abused: Not on file    Physically abused: Not on file    Forced sexual activity: Not on file  Other Topics Concern  . Not on file  Social History Narrative   Divorced   One daughter- lives locally and one grandson   Retired Pharmacist, hospital,  Has masters degree  Enjoys reading, spending time with her grandson    Past Surgical History:  Procedure Laterality Date  . CHOLECYSTECTOMY    . COLONOSCOPY    . COLONOSCOPY W/ BIOPSIES  10/06/2003  . LUMBAR FUSION  2008  . PARTIAL HIP ARTHROPLASTY Right 2009    hip replacement  . TONSILLECTOMY    . TOTAL KNEE ARTHROPLASTY Right 11/16/2013   Procedure: RIGHT TOTAL KNEE ARTHROPLASTY;  Surgeon: Vickey Huger, MD;  Location: [facility];  Service: Orthopedics;  Laterality: Right;  [name] VIDEO BRONCHOSCOPY Bilateral 12/05/2012   Procedure: VIDEO BRONCHOSCOPY WITHOUT FLUORO;  Surgeon: Collene Gobble, MD;  Location: [facility];  Service: Cardiopulmonary;  Laterality: Bilateral;    Family History  Problem Relation Age of Onset  . COPD Father        died at age 106  . Emphysema Father   . Heart Problems Father   . Heart disease Brother        stent with 90%  . COPD Mother   . Heart disease Mother   .  Ovarian cancer Mother   . Emphysema Mother   . Hypertension Mother   . Hyperlipidemia Mother   . Heart Problems Mother   . Stroke Mother   . Thyroid disease Mother   . Cancer Mother   . Breast cancer Maternal Aunt   . Colon cancer Neg Hx   . Esophageal cancer Neg Hx   . Rectal cancer Neg Hx   . Stomach cancer Neg Hx     Allergies  Allergen Reactions  . Pantoprazole Sodium Palpitations    Current Outpatient Medications on File Prior to Visit  Medication Sig Dispense Refill  . amLODipine (NORVASC) 5 MG tablet TAKE 1 TABLET BY MOUTH EVERY DAY (Patient taking differently: Take 5 mg by mouth daily. ) 90 tablet 1  . budesonide-formoterol (SYMBICORT) 160-4.5 MCG/ACT inhaler Inhale 2 puffs into the lungs 2 (two) times daily.    . Cholecalciferol (VITAMIN D3) 1.25 MG (50000 UT) CAPS Take 1 Dose by mouth once a week. 4 capsule 0  . DEXILANT 60 MG capsule TAKE 1 CAPSULE BY MOUTH EVERY DAY (Patient taking differently: Take 60 mg by mouth daily. ) 90 capsule 1  . loratadine (CLARITIN) 10 MG tablet Take 1 tablet (10 mg total) by mouth daily. 30 tablet 11  . montelukast (SINGULAIR) 10 MG tablet Take 10 mg by mouth at bedtime.    [name] omalizumab (XOLAIR) 150 MG injection Inject 150 mg into the skin every 14 (fourteen) days.     No current facility-administered medications on file prior to visit.     BP (!) 128/59 (BP Location: Left Arm, Patient Position: Sitting, Cuff Size: Large)   Pulse 80   Temp 98.5 F (36.9 C) (Oral)   Resp 16   Ht 5\' 3"  (1.6 m)   Wt 199 lb 6.4 oz (90.4 kg)   SpO2 97%   BMI 35.32 kg/m    Objective:   Physical Exam Constitutional:      Appearance: She is well-developed.  HENT:     Right Ear: Tympanic membrane and ear canal normal.     Left Ear: Tympanic membrane and ear canal normal.  Neck:     Musculoskeletal: Neck supple.     Thyroid: No thyromegaly.  Cardiovascular:     Rate and Rhythm: Normal rate and regular rhythm.     Heart sounds: Normal heart  sounds. No murmur.  Pulmonary:     Effort: Pulmonary effort is normal. No respiratory  distress.     Breath sounds: Normal breath sounds. No wheezing.  Abdominal:     Tenderness: There is right CVA tenderness.  Skin:    General: Skin is warm and dry.  Neurological:     Mental Status: She is alert and oriented to person, place, and time.  Psychiatric:        Behavior: Behavior normal.        Thought Content: Thought content normal.        Judgment: Judgment normal.           Assessment & Plan:  Pre-operative evaluation- will obtain baseline lab work, CXR.  Flank pain- will plan to treat with keflex empirically. Check UA/Culture and a KUB to evaluate for kidney stone.

## 2018-09-03 ENCOUNTER — Telehealth: Payer: Self-pay | Admitting: Family

## 2018-09-03 DIAGNOSIS — R109 Unspecified abdominal pain: Secondary | ICD-10-CM

## 2018-09-03 LAB — BASIC METABOLIC PANEL
BUN: 10 mg/dL (ref 6–23)
CO2: 27 mEq/L (ref 19–32)
Calcium: 9.4 mg/dL (ref 8.4–10.5)
Chloride: 104 mEq/L (ref 96–112)
Creatinine, Ser: 0.76 mg/dL (ref 0.40–1.20)
GFR: 75.72 mL/min (ref >60.00)
Glucose, Bld: 82 mg/dL (ref 70–99)
Potassium: 4.2 mEq/L (ref 3.5–5.1)
Sodium: 140 mEq/L (ref 135–145)

## 2018-09-03 LAB — CBC WITH DIFFERENTIAL/PLATELET
Basophils Absolute: 0.1 10*3/uL (ref 0.0–0.1)
Basophils Relative: 0.8 % (ref 0.0–3.0)
Eosinophils Absolute: 0.4 10*3/uL (ref 0.0–0.7)
Eosinophils Relative: 5.4 % — ABNORMAL HIGH (ref 0.0–5.0)
HCT: 39 % (ref 36.0–46.0)
Hemoglobin: 12.7 g/dL (ref 12.0–15.0)
Lymphocytes Relative: 37.3 % (ref 12.0–46.0)
Lymphs Abs: 3 10*3/uL (ref 0.7–4.0)
MCHC: 32.5 g/dL (ref 30.0–36.0)
MCV: 84.8 fl (ref 78.0–100.0)
Monocytes Absolute: 0.5 10*3/uL (ref 0.1–1.0)
Monocytes Relative: 6.6 % (ref 3.0–12.0)
Neutro Abs: 4.1 10*3/uL (ref 1.4–7.7)
Neutrophils Relative %: 49.9 % (ref 43.0–77.0)
Platelets: 299 10*3/uL (ref 150.0–400.0)
RBC: 4.6 Mil/uL (ref 3.87–5.11)
RDW: 16.2 % — ABNORMAL HIGH (ref 11.5–15.5)
WBC: 8.2 10*3/uL (ref 4.0–10.5)

## 2018-09-03 LAB — URINALYSIS, ROUTINE W REFLEX MICROSCOPIC
Bilirubin Urine: NEGATIVE
Hgb urine dipstick: NEGATIVE
Ketones, ur: NEGATIVE
Leukocytes,Ua: NEGATIVE
Nitrite: NEGATIVE
RBC / HPF: NONE SEEN (ref 0–?)
Specific Gravity, Urine: 1.01 (ref 1.000–1.030)
Total Protein, Urine: NEGATIVE
Urine Glucose: NEGATIVE
Urobilinogen, UA: 0.2 (ref 0.0–1.0)
pH: 6.5 (ref 5.0–8.0)

## 2018-09-03 LAB — HEPATIC FUNCTION PANEL
ALT: 32 U/L (ref 0–35)
AST: 26 U/L (ref 0–37)
Albumin: 4.3 g/dL (ref 3.5–5.2)
Alkaline Phosphatase: 74 U/L (ref 39–117)
Bilirubin, Direct: 0 mg/dL (ref 0.0–0.3)
Total Bilirubin: 0.3 mg/dL (ref 0.2–1.2)
Total Protein: 7.3 g/dL (ref 6.0–8.3)

## 2018-09-03 LAB — PROTIME-INR
INR: 0.9 ratio (ref 0.8–1.0)
Prothrombin Time: 11.1 s (ref 9.6–13.1)

## 2018-09-03 NOTE — Telephone Encounter (Signed)
Left voicemail to check mychart

## 2018-09-03 NOTE — Progress Notes (Signed)
LOV DR [facility] 07-30-18 Epic EKG 07-30-18 Epic LOV MELISSA O"SULLIVAN NP 09-02-18 Epic  LABS DONE IN Epic 09-02-18: CBC WITH DIF, BMET, HEPATIC FUNCTION, PT, UA. VIT D125. CHEST XRAY 04-28-18 Epic ECHO 08-08-18 EPIC

## 2018-09-03 NOTE — Patient Instructions (Addendum)
YOU NEED TO HAVE A COVID 19 TEST ON 09-06-2018 @1000  AM, THIS TEST MUST BE DONE BEFORE SURGERY, COME TO [place] ENTRANCE. ONCE YOUR COVID TEST IS COMPLETED, PLEASE BEGIN THE QUARANTINE INSTRUCTIONS AS OUTLINED IN YOUR HANDOUT.                [place]    Your procedure is scheduled on: 09-10-2018   Report to [facility] Main  Entrance    Report to ADMITTING at 6:30 AM      Call this number if you have problems the morning of surgery [phone]    Remember: [place], NO [place].    Do not eat food After Midnight. YOU MAY HAVE CLEAR LIQUIDS FROM MIDNIGHT UNTIL 5:30AM. At 5:30AM Please finish the prescribed Pre-Surgery Gatorade drink. Nothing by mouth after you finish the Gatorade drink !   CLEAR LIQUID DIET   Foods Allowed                                                                     Foods Excluded  Coffee and tea, regular and decaf                             liquids that you cannot  Plain Jell-O in any flavor                                             see through such as: Fruit ices (not with fruit pulp)                                     milk, soups, orange juice  Iced Popsicles                                    All solid food Carbonated beverages, regular and diet                                    Cranberry, grape and apple juices Sports drinks like Gatorade Lightly seasoned clear broth or consume(fat free) Sugar, honey syrup  Sample Menu Breakfast                                Lunch                                     Supper Cranberry juice                    Beef broth  Chicken broth Jell-O                                     Grape juice                           Apple juice Coffee or tea                        Jell-O                                      Popsicle                                                Coffee or  tea                        Coffee or tea  _____________________________________________________________________     Take these medicines the morning of surgery with A SIP OF WATER: AMLODIPINE, SYMBICORT INHALER (PLEASE BRING), KEFLEX, DEXILANT, LORATADINE                                You may not have any metal on your body including hair pins and              piercings  Do not wear jewelry, make-up, lotions, powders or perfumes, deodorant             Do not wear nail polish.  Do not shave  48 hours prior to surgery.                Do not bring valuables to the hospital. [place].  Contacts, dentures or bridgework may not be worn into surgery.  Leave suitcase in the car. After surgery it may be brought to your room.     _____________________________________________________________________             [facility] - Preparing for Surgery Before surgery, you can play an important role.  Because skin is not sterile, your skin needs to be as free of germs as possible.  You can reduce the number of germs on your skin by washing with CHG (chlorahexidine gluconate) soap before surgery.  CHG is an antiseptic cleaner which kills germs and bonds with the skin to continue killing germs even after washing. Please DO NOT use if you have an allergy to CHG or antibacterial soaps.  If your skin becomes reddened/irritated stop using the CHG and inform your nurse when you arrive at Short Stay. Do not shave (including legs and underarms) for at least 48 hours prior to the first CHG shower.  You may shave your face/neck. Please follow these instructions carefully:  1.  Shower with CHG Soap the night before surgery and the  morning of Surgery.  2.  If you choose to wash your hair, wash your hair first as usual with your  normal  shampoo.  3.  After you shampoo, rinse  your hair and body thoroughly to remove the  shampoo.                           4.  Use CHG  as you would any other liquid soap.  You can apply chg directly  to the skin and wash                       Gently with a scrungie or clean washcloth.  5.  Apply the CHG Soap to your body ONLY FROM THE NECK DOWN.   Do not use on face/ open                           Wound or open sores. Avoid contact with eyes, ears mouth and genitals (private parts).                       Wash face,  Genitals (private parts) with your normal soap.             6.  Wash thoroughly, paying special attention to the area where your surgery  will be performed.  7.  Thoroughly rinse your body with warm water from the neck down.  8.  DO NOT shower/wash with your normal soap after using and rinsing off  the CHG Soap.                9.  Pat yourself dry with a clean towel.            10.  Wear clean pajamas.            11.  Place clean sheets on your bed the night of your first shower and do not  sleep with pets. Day of Surgery : Do not apply any lotions/deodorants the morning of surgery.  Please wear clean clothes to the hospital/surgery center.  FAILURE TO FOLLOW THESE INSTRUCTIONS MAY RESULT IN THE CANCELLATION OF YOUR SURGERY PATIENT SIGNATURE_________________________________  NURSE SIGNATURE__________________________________  ________________________________________________________________________   Brandy Dunlap  An incentive spirometer is a tool that can help keep your lungs clear and active. This tool measures how well you are filling your lungs with each breath. Taking long deep breaths may help reverse or decrease the chance of developing breathing (pulmonary) problems (especially infection) following:  A long period of time when you are unable to move or be active. BEFORE THE PROCEDURE   If the spirometer includes an indicator to show your best effort, your nurse or respiratory therapist will set it to a desired goal.  If possible, sit up straight or lean slightly forward. Try not to  slouch.  Hold the incentive spirometer in an upright position. INSTRUCTIONS FOR USE  1. Sit on the edge of your bed if possible, or sit up as far as you can in bed or on a chair. 2. Hold the incentive spirometer in an upright position. 3. Breathe out normally. 4. Place the mouthpiece in your mouth and seal your lips tightly around it. 5. Breathe in slowly and as deeply as possible, raising the piston or the ball toward the top of the column. 6. Hold your breath for 3-5 seconds or for as long as possible. Allow the piston or ball to fall to the bottom of the column. 7. Remove the mouthpiece from your mouth and breathe out normally. 8. Rest for a few seconds and  repeat Steps 1 through 7 at least 10 times every 1-2 hours when you are awake. Take your time and take a few normal breaths between deep breaths. 9. The spirometer may include an indicator to show your best effort. Use the indicator as a goal to work toward during each repetition. 10. After each set of 10 deep breaths, practice coughing to be sure your lungs are clear. If you have an incision (the cut made at the time of surgery), support your incision when coughing by placing a pillow or rolled up towels firmly against it. Once you are able to get out of bed, walk around indoors and cough well. You may stop using the incentive spirometer when instructed by your caregiver.  RISKS AND COMPLICATIONS  Take your time so you do not get dizzy or light-headed.  If you are in pain, you may need to take or ask for pain medication before doing incentive spirometry. It is harder to take a deep breath if you are having pain. AFTER USE  Rest and breathe slowly and easily.  It can be helpful to keep track of a log of your progress. Your caregiver can provide you with a simple table to help with this. If you are using the spirometer at home, follow these instructions: [place] IF:   You are having difficultly using the spirometer.  You  have trouble using the spirometer as often as instructed.  Your pain medication is not giving enough relief while using the spirometer.  You develop fever of 100.5 F (38.1 C) or higher. SEEK IMMEDIATE MEDICAL CARE IF:   You cough up bloody sputum that had not been present before.  You develop fever of 102 F (38.9 C) or greater.  You develop worsening pain at or near the incision site. MAKE SURE YOU:   Understand these instructions.  Will watch your condition.  Will get help right away if you are not doing well or get worse. Document Released: 06/25/2006 Document Revised: 05/07/2011 Document Reviewed: 08/26/2006 Exit[employer] Patient Information 2014 Exit[employer], [place].   ________________________________________________________________________  WHAT IS A BLOOD TRANSFUSION? Blood Transfusion Information  A transfusion is the replacement of blood or some of its parts. Blood is made up of multiple cells which provide different functions.  Red blood cells carry oxygen and are used for blood loss replacement.  White blood cells fight against infection.  Platelets control bleeding.  Plasma helps clot blood.  Other blood products are available for specialized needs, such as hemophilia or other clotting disorders. BEFORE THE TRANSFUSION  Who gives blood for transfusions?   Healthy volunteers who are fully evaluated to make sure their blood is safe. This is blood bank blood. Transfusion therapy is the safest it has ever been in the practice of medicine. Before blood is taken from a donor, a complete history is taken to make sure that person has no history of diseases nor engages in risky social behavior (examples are intravenous drug use or sexual activity with multiple partners). The donor's travel history is screened to minimize risk of transmitting infections, such as malaria. The donated blood is tested for signs of infectious diseases, such as HIV and hepatitis. The blood is then  tested to be sure it is compatible with you in order to minimize the chance of a transfusion reaction. If you or a relative donates blood, this is often done in anticipation of surgery and is not appropriate for emergency situations. It takes many days to process the donated  blood. RISKS AND COMPLICATIONS Although transfusion therapy is very safe and saves many lives, the main dangers of transfusion include:   Getting an infectious disease.  Developing a transfusion reaction. This is an allergic reaction to something in the blood you were given. Every precaution is taken to prevent this. The decision to have a blood transfusion has been considered carefully by your caregiver before blood is given. Blood is not given unless the benefits outweigh the risks. AFTER THE TRANSFUSION  Right after receiving a blood transfusion, you will usually feel much better and more energetic. This is especially true if your red blood cells have gotten low (anemic). The transfusion raises the level of the red blood cells which carry oxygen, and this usually causes an energy increase.  The nurse administering the transfusion will monitor you carefully for complications. HOME CARE INSTRUCTIONS  No special instructions are needed after a transfusion. You may find your energy is better. Speak with your caregiver about any limitations on activity for underlying diseases you may have. SEEK MEDICAL CARE IF:   Your condition is not improving after your transfusion.  You develop redness or irritation at the intravenous (IV) site. SEEK IMMEDIATE MEDICAL CARE IF:  Any of the following symptoms occur over the next 12 hours:  Shaking chills.  You have a temperature by mouth above 102 F (38.9 C), not controlled by medicine.  Chest, back, or muscle pain.  People around you feel you are not acting correctly or are confused.  Shortness of breath or difficulty breathing.  Dizziness and fainting.  You get a rash or  develop hives.  You have a decrease in urine output.  Your urine turns a dark color or changes to pink, red, or brown. Any of the following symptoms occur over the next 10 days:  You have a temperature by mouth above 102 F (38.9 C), not controlled by medicine.  Shortness of breath.  Weakness after normal activity.  The white part of the eye turns yellow (jaundice).  You have a decrease in the amount of urine or are urinating less often.  Your urine turns a dark color or changes to pink, red, or brown. Document Released: 02/10/2000 Document Revised: 05/07/2011 Document Reviewed: 09/29/2007 [facility] Patient Information 2014 [place], [place].  _______________________________________________________________________

## 2018-09-04 ENCOUNTER — Encounter (HOSPITAL_COMMUNITY): Payer: Self-pay

## 2018-09-04 ENCOUNTER — Ambulatory Visit (HOSPITAL_BASED_OUTPATIENT_CLINIC_OR_DEPARTMENT_OTHER)
Admission: RE | Admit: 2018-09-04 | Discharge: 2018-09-04 | Disposition: A | Discharge status: Home / Self Care | Payer: Medicare Other | Source: Ambulatory Visit | Attending: Family | Admitting: Family

## 2018-09-04 ENCOUNTER — Encounter (HOSPITAL_COMMUNITY)
Admission: RE | Admit: 2018-09-04 | Discharge: 2018-09-04 | Disposition: A | Discharge status: Home / Self Care | Payer: Medicare Other | Source: Ambulatory Visit | Attending: Orthopedic Surgery | Admitting: Orthopedic Surgery

## 2018-09-04 ENCOUNTER — Other Ambulatory Visit: Payer: Self-pay

## 2018-09-04 DIAGNOSIS — Z79899 Other long term (current) drug therapy: Secondary | ICD-10-CM | POA: Insufficient documentation

## 2018-09-04 DIAGNOSIS — I700 Atherosclerosis of aorta: Secondary | ICD-10-CM | POA: Diagnosis not present

## 2018-09-04 DIAGNOSIS — Z87442 Personal history of urinary calculi: Secondary | ICD-10-CM | POA: Insufficient documentation

## 2018-09-04 DIAGNOSIS — R109 Unspecified abdominal pain: Secondary | ICD-10-CM | POA: Diagnosis not present

## 2018-09-04 DIAGNOSIS — R7303 Prediabetes: Secondary | ICD-10-CM | POA: Diagnosis not present

## 2018-09-04 DIAGNOSIS — Z1159 Encounter for screening for other viral diseases: Secondary | ICD-10-CM | POA: Diagnosis not present

## 2018-09-04 DIAGNOSIS — Z01818 Encounter for other preprocedural examination: Secondary | ICD-10-CM | POA: Insufficient documentation

## 2018-09-04 DIAGNOSIS — M1612 Unilateral primary osteoarthritis, left hip: Secondary | ICD-10-CM | POA: Diagnosis not present

## 2018-09-04 DIAGNOSIS — K5790 Diverticulosis of intestine, part unspecified, without perforation or abscess without bleeding: Secondary | ICD-10-CM | POA: Diagnosis not present

## 2018-09-04 DIAGNOSIS — I10 Essential (primary) hypertension: Secondary | ICD-10-CM | POA: Insufficient documentation

## 2018-09-04 HISTORY — DX: Personal history of urinary calculi: Z87.442

## 2018-09-04 HISTORY — DX: Prediabetes: R73.03

## 2018-09-04 LAB — SURGICAL PCR SCREEN
MRSA, PCR: NEGATIVE
Staphylococcus aureus: NEGATIVE

## 2018-09-04 LAB — ABO/RH: ABO/RH(D): A POS

## 2018-09-04 LAB — VITAMIN D 1,25 DIHYDROXY
Vitamin D 1, 25 (OH)2 Total: 46 pg/mL (ref 18–72)
Vitamin D2 1, 25 (OH)2: <8 pg/mL
Vitamin D3 1, 25 (OH)2: 46 pg/mL

## 2018-09-04 LAB — APTT: aPTT: 28 seconds (ref 24–36)

## 2018-09-04 LAB — GLUCOSE, CAPILLARY: Glucose-Capillary: 119 mg/dL — ABNORMAL HIGH (ref 70–99)

## 2018-09-05 ENCOUNTER — Telehealth: Payer: Self-pay | Admitting: Family

## 2018-09-05 ENCOUNTER — Ambulatory Visit: Payer: Medicare Other | Admitting: Family

## 2018-09-05 LAB — HEMOGLOBIN A1C
Hgb A1c MFr Bld: 6.1 % — ABNORMAL HIGH (ref 4.8–5.6)
Mean Plasma Glucose: 128 mg/dL

## 2018-09-05 MED ORDER — VITAMIN D3 75 MCG (3000 UT) PO TABS: ORAL_TABLET | ORAL | 1 refills | Status: DC

## 2018-09-05 NOTE — Anesthesia Preprocedure Evaluation (Addendum)
Anesthesia Evaluation  Patient identified by MRN, date of birth, ID band Patient awake    Reviewed: Allergy & Precautions, H&P , NPO status , Patient's Chart, lab work & pertinent test results  Airway Mallampati: II  TM Distance: >3 FB Neck ROM: Full    Dental no notable dental hx. (+) Teeth Intact, Dental Advisory Given   Pulmonary asthma ,    Pulmonary exam normal breath sounds clear to auscultation       Cardiovascular Exercise Tolerance: Good hypertension, Pt. on medications  Rhythm:Regular Rate:Normal     Neuro/Psych Depression negative neurological ROS     GI/Hepatic Neg liver ROS, GERD  Controlled,  Endo/Other  negative endocrine ROS  Renal/GU negative Renal ROS  negative genitourinary   Musculoskeletal  (+) Arthritis , Osteoarthritis,    Abdominal   Peds  Hematology negative hematology ROS (+)   Anesthesia Other Findings   Reproductive/Obstetrics negative OB ROS                          Anesthesia Physical Anesthesia Plan  ASA: III  Anesthesia Plan: General   Post-op Pain Management:    Induction: Intravenous  PONV Risk Score and Plan: 4 or greater and Ondansetron, Dexamethasone and Midazolam  Airway Management Planned: Oral ETT  Additional Equipment:   Intra-op Plan:   Post-operative Plan: Extubation in OR  Informed Consent: I have reviewed the patients History and Physical, chart, labs and discussed the procedure including the risks, benefits and alternatives for the proposed anesthesia with the patient or authorized representative who has indicated his/her understanding and acceptance.     Dental advisory given  Plan Discussed with: CRNA  Anesthesia Plan Comments: (See PAT note 09/04/2018, Konrad Felix, PA-C Pt would like to try a spinal for this procedure. She has had a lumbar fusion in the past and I discussed with her that I'll try a spinal but it is  unlikely I'll be successful due to her previous back surgery. She is ok with trying and ok with GA if unsuccessful.)      Anesthesia Quick Evaluation

## 2018-09-05 NOTE — Telephone Encounter (Signed)
See mychart.  

## 2018-09-05 NOTE — Progress Notes (Addendum)
Anesthesia Chart Review   Case: [id number] Date/Time: 09/10/18 0815   Procedure: TOTAL HIP ARTHROPLASTY ANTERIOR APPROACH (Left )   Anesthesia type: Choice   Pre-op diagnosis: left hip osteoarthritis   Location: [facility] ROOM 10 / [facility]   Surgeon: Gaynelle Arabian, MD      DISCUSSION:67 y.o. never smoker with h/o GERD, HTN, asthma, pre-diabetes, lumbar fusion L4-5 with hardware 2008, left hip OA scheduled for above procedure 09/10/2018 with Dr. Gaynelle Arabian.   Pt seen by cardiologist, Dr. Jenne Campus, 07/30/2018 for preoperative evaluation.  Per OV note, "She appears to be doing quite well especially with exercises.  Her EKG however today showed worsening of incomplete right bundle branch block.  I will schedule her to have echocardiogram to assess left ventricular ejection fraction before surgery, however, I doubt very much will discover something that would prevent [name] from proceeding with surgery."  Echo 08/08/2018 with EF 60-65%, mild LVH.   Anticipate pt can proceed with planned procedure barring acute status change.   VS: BP (!) 154/82 (BP Location: Right Arm)   Pulse 85   Temp 37.1 C (Oral)   Resp 18   Ht 5\' 3"  (1.6 m)   Wt 91.4 kg   SpO2 96%   BMI 35.69 kg/m   PROVIDERS: [name], NP is PCP   Jenne Campus, MD is Cardiologist  LABS: Labs reviewed: Acceptable for surgery. (all labs ordered are listed, but only abnormal results are displayed)  Labs Reviewed  GLUCOSE, CAPILLARY - Abnormal; Notable for the following components:      Result Value   Glucose-Capillary 119 (*)    All other components within normal limits  HEMOGLOBIN A1C - Abnormal; Notable for the following components:   Hgb A1c MFr Bld 6.1 (*)    All other components within normal limits  SURGICAL PCR SCREEN  APTT  TYPE AND SCREEN  ABO/RH     IMAGES: Chest Xray 09/02/2018 FINDINGS: There are atherosclerotic changes of the abdominal aorta. The heart size is stable from prior study. There is  no pneumothorax. No large pleural effusion. No acute osseous abnormality.  IMPRESSION: No active cardiopulmonary disease.  EKG: 07/30/2018 Rate 76 bpm Normal sinus rhythm  Low voltage QRS RSR' or QR pattern in V1 suggests right ventricular conduction delay Nonspecific ST and T wave abnormality  CV: Echo 08/08/2018 IMPRESSIONS    1. The left ventricle has normal systolic function with an ejection fraction of 60-65%. The cavity size was normal. There is mild concentric left ventricular hypertrophy. Left ventricular diastolic parameters were normal. GLS - 20.8%  2. The right ventricle has normal systolic function. The cavity was normal. There is no increase in right ventricular wall thickness.  3. The mitral valve is degenerative. There is mild mitral annular calcification present. No evidence of mitral valve stenosis.  4. The aortic valve is tricuspid. No stenosis of the aortic valve.  5. The aortic root, ascending aorta and aortic arch are normal in size and structure. Past Medical History:  Diagnosis Date  . Allergy   . Asthma   . B12 deficiency   . Back pain   . Bronchitis    hx  . Colon polyp 10/06/2003  . GERD (gastroesophageal reflux disease)   . History of kidney stones    per 09-02-2018 pre-op eval , suspected kidney stone , to [facility] CT abd on 7-9- for further eval   . Hypertension   . Low back pain   . Osteoarthritis   . Pre-diabetes   .  Sinus trouble     Past Surgical History:  Procedure Laterality Date  . CHOLECYSTECTOMY    . COLONOSCOPY    . COLONOSCOPY W/ BIOPSIES  10/06/2003  . LUMBAR FUSION  2008  . PARTIAL HIP ARTHROPLASTY Right 2009    hip replacement  . TONSILLECTOMY    . TOTAL KNEE ARTHROPLASTY Right 11/16/2013   Procedure: RIGHT TOTAL KNEE ARTHROPLASTY;  Surgeon: Vickey Huger, MD;  Location: [place];  Service: Orthopedics;  Laterality: Right;  [name] VIDEO BRONCHOSCOPY Bilateral 12/05/2012   Procedure: VIDEO BRONCHOSCOPY WITHOUT FLUORO;  Surgeon: Collene Gobble, MD;  [facility];  Service: Cardiopulmonary;  Laterality: Bilateral;    MEDICATIONS: . amLODipine (NORVASC) 5 MG tablet  . budesonide-formoterol (SYMBICORT) 160-4.5 MCG/ACT inhaler  . cephALEXin (KEFLEX) 500 MG capsule  . Cholecalciferol (VITAMIN D3) 1.25 MG (50000 UT) CAPS  . DEXILANT 60 MG capsule  . loratadine (CLARITIN) 10 MG tablet  . montelukast (SINGULAIR) 10 MG tablet  . omalizumab (XOLAIR) 150 MG injection   No current facility-administered medications for this encounter.     Maia Plan [facility] Pre-Surgical Testing [phone] 09/05/18  9:37 AM

## 2018-09-05 NOTE — Telephone Encounter (Signed)
Opened in error

## 2018-09-06 ENCOUNTER — Other Ambulatory Visit (HOSPITAL_COMMUNITY)
Admission: RE | Admit: 2018-09-06 | Discharge: 2018-09-06 | Disposition: A | Discharge status: Home / Self Care | Payer: Medicare Other | Source: Ambulatory Visit | Attending: Orthopedic Surgery | Admitting: Orthopedic Surgery

## 2018-09-06 DIAGNOSIS — Z01818 Encounter for other preprocedural examination: Secondary | ICD-10-CM | POA: Diagnosis not present

## 2018-09-06 LAB — SARS CORONAVIRUS 2 (TAT 6-24 HRS): SARS Coronavirus 2: NEGATIVE

## 2018-09-10 ENCOUNTER — Inpatient Hospital Stay (HOSPITAL_COMMUNITY): Payer: Medicare Other | Admitting: Physician Assistant

## 2018-09-10 ENCOUNTER — Encounter (HOSPITAL_COMMUNITY)
Admission: RE | Disposition: A | Discharge status: Home / Self Care | Payer: Self-pay | Source: Home / Self Care | Attending: Orthopedic Surgery

## 2018-09-10 ENCOUNTER — Other Ambulatory Visit: Payer: Self-pay

## 2018-09-10 ENCOUNTER — Encounter (HOSPITAL_COMMUNITY): Payer: Self-pay | Admitting: Anesthesiology

## 2018-09-10 ENCOUNTER — Inpatient Hospital Stay (HOSPITAL_COMMUNITY)
Admission: RE | Admit: 2018-09-10 | Discharge: 2018-09-11 | DRG: 470 | Disposition: A | Discharge status: Home / Self Care | Payer: Medicare Other | Attending: Orthopedic Surgery | Admitting: Orthopedic Surgery

## 2018-09-10 ENCOUNTER — Inpatient Hospital Stay (HOSPITAL_COMMUNITY): Payer: Medicare Other

## 2018-09-10 ENCOUNTER — Inpatient Hospital Stay (HOSPITAL_COMMUNITY): Payer: Medicare Other | Admitting: Anesthesiology

## 2018-09-10 DIAGNOSIS — Z86010 Personal history of colonic polyps: Secondary | ICD-10-CM

## 2018-09-10 DIAGNOSIS — Z6835 Body mass index (BMI) 35.0-35.9, adult: Secondary | ICD-10-CM | POA: Diagnosis not present

## 2018-09-10 DIAGNOSIS — E669 Obesity, unspecified: Secondary | ICD-10-CM | POA: Diagnosis present

## 2018-09-10 DIAGNOSIS — Z981 Arthrodesis status: Secondary | ICD-10-CM | POA: Diagnosis not present

## 2018-09-10 DIAGNOSIS — Z96641 Presence of right artificial hip joint: Secondary | ICD-10-CM | POA: Diagnosis present

## 2018-09-10 DIAGNOSIS — Z7951 Long term (current) use of inhaled steroids: Secondary | ICD-10-CM | POA: Diagnosis not present

## 2018-09-10 DIAGNOSIS — Z888 Allergy status to other drugs, medicaments and biological substances status: Secondary | ICD-10-CM

## 2018-09-10 DIAGNOSIS — Z8249 Family history of ischemic heart disease and other diseases of the circulatory system: Secondary | ICD-10-CM | POA: Diagnosis not present

## 2018-09-10 DIAGNOSIS — Z8041 Family history of malignant neoplasm of ovary: Secondary | ICD-10-CM

## 2018-09-10 DIAGNOSIS — R7303 Prediabetes: Secondary | ICD-10-CM | POA: Diagnosis present

## 2018-09-10 DIAGNOSIS — I10 Essential (primary) hypertension: Secondary | ICD-10-CM | POA: Diagnosis present

## 2018-09-10 DIAGNOSIS — M25752 Osteophyte, left hip: Secondary | ICD-10-CM | POA: Diagnosis present

## 2018-09-10 DIAGNOSIS — Z825 Family history of asthma and other chronic lower respiratory diseases: Secondary | ICD-10-CM

## 2018-09-10 DIAGNOSIS — Z96651 Presence of right artificial knee joint: Secondary | ICD-10-CM | POA: Diagnosis present

## 2018-09-10 DIAGNOSIS — J45909 Unspecified asthma, uncomplicated: Secondary | ICD-10-CM | POA: Diagnosis present

## 2018-09-10 DIAGNOSIS — Z87442 Personal history of urinary calculi: Secondary | ICD-10-CM

## 2018-09-10 DIAGNOSIS — K219 Gastro-esophageal reflux disease without esophagitis: Secondary | ICD-10-CM | POA: Diagnosis present

## 2018-09-10 DIAGNOSIS — E538 Deficiency of other specified B group vitamins: Secondary | ICD-10-CM | POA: Diagnosis present

## 2018-09-10 DIAGNOSIS — Z79899 Other long term (current) drug therapy: Secondary | ICD-10-CM | POA: Diagnosis not present

## 2018-09-10 DIAGNOSIS — Z8349 Family history of other endocrine, nutritional and metabolic diseases: Secondary | ICD-10-CM | POA: Diagnosis not present

## 2018-09-10 DIAGNOSIS — Z823 Family history of stroke: Secondary | ICD-10-CM

## 2018-09-10 DIAGNOSIS — M1612 Unilateral primary osteoarthritis, left hip: Secondary | ICD-10-CM | POA: Diagnosis present

## 2018-09-10 DIAGNOSIS — Z9049 Acquired absence of other specified parts of digestive tract: Secondary | ICD-10-CM

## 2018-09-10 DIAGNOSIS — Z803 Family history of malignant neoplasm of breast: Secondary | ICD-10-CM | POA: Diagnosis not present

## 2018-09-10 DIAGNOSIS — M169 Osteoarthritis of hip, unspecified: Secondary | ICD-10-CM | POA: Diagnosis present

## 2018-09-10 DIAGNOSIS — Z96649 Presence of unspecified artificial hip joint: Secondary | ICD-10-CM

## 2018-09-10 DIAGNOSIS — M25552 Pain in left hip: Secondary | ICD-10-CM

## 2018-09-10 HISTORY — PX: TOTAL HIP ARTHROPLASTY: SHX124

## 2018-09-10 LAB — TYPE AND SCREEN
ABO/RH(D): A POS
Antibody Screen: NEGATIVE

## 2018-09-10 SURGERY — ARTHROPLASTY, HIP, TOTAL, ANTERIOR APPROACH
Anesthesia: General | Site: Hip | Laterality: Left

## 2018-09-10 MED ORDER — DEXAMETHASONE SODIUM PHOSPHATE 10 MG/ML IJ SOLN
10.0000 mg | Freq: Once | INTRAMUSCULAR | Status: AC
  Administered 2018-09-11: 10 mg via INTRAVENOUS
  Filled 2018-09-10: qty 1

## 2018-09-10 MED ORDER — PROPOFOL 10 MG/ML IV BOLUS
INTRAVENOUS | Status: AC
  Filled 2018-09-10: qty 40

## 2018-09-10 MED ORDER — PROPOFOL 10 MG/ML IV BOLUS
INTRAVENOUS | Status: DC | PRN
  Administered 2018-09-10: 130 mg via INTRAVENOUS

## 2018-09-10 MED ORDER — ACETAMINOPHEN 10 MG/ML IV SOLN
1000.0000 mg | Freq: Four times a day (QID) | INTRAVENOUS | Status: DC
  Administered 2018-09-10: 1000 mg via INTRAVENOUS
  Filled 2018-09-10: qty 100

## 2018-09-10 MED ORDER — DOCUSATE SODIUM 100 MG PO CAPS
100.0000 mg | ORAL_CAPSULE | Freq: Two times a day (BID) | ORAL | Status: DC
  Administered 2018-09-10 – 2018-09-11 (×2): 100 mg via ORAL
  Filled 2018-09-10 (×2): qty 1

## 2018-09-10 MED ORDER — ONDANSETRON HCL 4 MG/2ML IJ SOLN
INTRAMUSCULAR | Status: DC | PRN
  Administered 2018-09-10 (×2): 4 mg via INTRAVENOUS

## 2018-09-10 MED ORDER — METHOCARBAMOL 500 MG PO TABS
500.0000 mg | ORAL_TABLET | Freq: Four times a day (QID) | ORAL | Status: DC | PRN
  Administered 2018-09-10 – 2018-09-11 (×3): 500 mg via ORAL
  Filled 2018-09-10 (×3): qty 1

## 2018-09-10 MED ORDER — LIDOCAINE 2% (20 MG/ML) 5 ML SYRINGE
INTRAMUSCULAR | Status: AC
  Filled 2018-09-10: qty 5

## 2018-09-10 MED ORDER — LABETALOL HCL 5 MG/ML IV SOLN
INTRAVENOUS | Status: AC
  Filled 2018-09-10: qty 4

## 2018-09-10 MED ORDER — CEFAZOLIN SODIUM-DEXTROSE 2-4 GM/100ML-% IV SOLN
2.0000 g | Freq: Four times a day (QID) | INTRAVENOUS | Status: AC
  Administered 2018-09-10 (×2): 2 g via INTRAVENOUS
  Filled 2018-09-10 (×2): qty 100

## 2018-09-10 MED ORDER — PHENYLEPHRINE HCL-NACL 10-0.9 MG/250ML-% IV SOLN
INTRAVENOUS | Status: AC
  Filled 2018-09-10: qty 250

## 2018-09-10 MED ORDER — AMLODIPINE BESYLATE 5 MG PO TABS
5.0000 mg | ORAL_TABLET | Freq: Every day | ORAL | Status: DC
  Administered 2018-09-10: 22:00:00 5 mg via ORAL
  Filled 2018-09-10: qty 1

## 2018-09-10 MED ORDER — METOCLOPRAMIDE HCL 5 MG/ML IJ SOLN: 5.0000 mg | Freq: Three times a day (TID) | INTRAMUSCULAR | Status: DC | PRN

## 2018-09-10 MED ORDER — ALBUMIN HUMAN 5 % IV SOLN
INTRAVENOUS | Status: AC
  Filled 2018-09-10: qty 500

## 2018-09-10 MED ORDER — BISACODYL 10 MG RE SUPP: 10.0000 mg | Freq: Every day | RECTAL | Status: DC | PRN

## 2018-09-10 MED ORDER — SUCCINYLCHOLINE CHLORIDE 200 MG/10ML IV SOSY
PREFILLED_SYRINGE | INTRAVENOUS | Status: AC
  Filled 2018-09-10: qty 10

## 2018-09-10 MED ORDER — ONDANSETRON HCL 4 MG PO TABS: 4.0000 mg | ORAL_TABLET | Freq: Four times a day (QID) | ORAL | Status: DC | PRN

## 2018-09-10 MED ORDER — METHOCARBAMOL 500 MG IVPB - SIMPLE MED
500.0000 mg | Freq: Four times a day (QID) | INTRAVENOUS | Status: DC | PRN
  Administered 2018-09-10: 500 mg via INTRAVENOUS
  Filled 2018-09-10: qty 50

## 2018-09-10 MED ORDER — CEFAZOLIN SODIUM-DEXTROSE 2-4 GM/100ML-% IV SOLN
2.0000 g | INTRAVENOUS | Status: AC
  Administered 2018-09-10: 2 g via INTRAVENOUS
  Filled 2018-09-10: qty 100

## 2018-09-10 MED ORDER — HYDROMORPHONE HCL 1 MG/ML IJ SOLN
INTRAMUSCULAR | Status: AC
  Filled 2018-09-10: qty 1

## 2018-09-10 MED ORDER — LIDOCAINE 2% (20 MG/ML) 5 ML SYRINGE
INTRAMUSCULAR | Status: DC | PRN
  Administered 2018-09-10: 40 mg via INTRAVENOUS
  Administered 2018-09-10: 60 mg via INTRAVENOUS

## 2018-09-10 MED ORDER — FLEET ENEMA 7-19 GM/118ML RE ENEM: 1.0000 | ENEMA | Freq: Once | RECTAL | Status: DC | PRN

## 2018-09-10 MED ORDER — PHENOL 1.4 % MT LIQD: 1.0000 | OROMUCOSAL | Status: DC | PRN

## 2018-09-10 MED ORDER — ASPIRIN EC 325 MG PO TBEC
325.0000 mg | DELAYED_RELEASE_TABLET | Freq: Two times a day (BID) | ORAL | Status: DC
  Administered 2018-09-11: 325 mg via ORAL
  Filled 2018-09-10: qty 1

## 2018-09-10 MED ORDER — MIDAZOLAM HCL 5 MG/5ML IJ SOLN
INTRAMUSCULAR | Status: DC | PRN
  Administered 2018-09-10: 2 mg via INTRAVENOUS

## 2018-09-10 MED ORDER — ROCURONIUM BROMIDE 10 MG/ML (PF) SYRINGE
PREFILLED_SYRINGE | INTRAVENOUS | Status: AC
  Filled 2018-09-10: qty 10

## 2018-09-10 MED ORDER — FENTANYL CITRATE (PF) 100 MCG/2ML IJ SOLN
INTRAMUSCULAR | Status: AC
  Filled 2018-09-10: qty 2

## 2018-09-10 MED ORDER — PROMETHAZINE HCL 25 MG/ML IJ SOLN
6.2500 mg | Freq: Once | INTRAMUSCULAR | Status: AC
  Administered 2018-09-10: 6.25 mg via INTRAVENOUS

## 2018-09-10 MED ORDER — ONDANSETRON HCL 4 MG/2ML IJ SOLN
INTRAMUSCULAR | Status: AC
  Filled 2018-09-10: qty 2

## 2018-09-10 MED ORDER — LACTATED RINGERS IV SOLN
INTRAVENOUS | Status: DC
  Administered 2018-09-10 (×3): via INTRAVENOUS

## 2018-09-10 MED ORDER — MIDAZOLAM HCL 2 MG/2ML IJ SOLN
INTRAMUSCULAR | Status: AC
  Filled 2018-09-10: qty 2

## 2018-09-10 MED ORDER — CHLORHEXIDINE GLUCONATE 4 % EX LIQD: 60.0000 mL | Freq: Once | CUTANEOUS | Status: DC

## 2018-09-10 MED ORDER — TRANEXAMIC ACID-NACL 1000-0.7 MG/100ML-% IV SOLN
1000.0000 mg | INTRAVENOUS | Status: AC
  Administered 2018-09-10: 1000 mg via INTRAVENOUS
  Filled 2018-09-10: qty 100

## 2018-09-10 MED ORDER — SODIUM CHLORIDE 0.9 % IV SOLN
INTRAVENOUS | Status: DC
  Administered 2018-09-10 – 2018-09-11 (×2): via INTRAVENOUS

## 2018-09-10 MED ORDER — HYDROMORPHONE HCL 1 MG/ML IJ SOLN
0.2500 mg | INTRAMUSCULAR | Status: DC | PRN
  Administered 2018-09-10 (×4): 0.5 mg via INTRAVENOUS

## 2018-09-10 MED ORDER — TRAMADOL HCL 50 MG PO TABS: 50.0000 mg | ORAL_TABLET | Freq: Four times a day (QID) | ORAL | Status: DC | PRN

## 2018-09-10 MED ORDER — 0.9 % SODIUM CHLORIDE (POUR BTL) OPTIME
TOPICAL | Status: DC | PRN
  Administered 2018-09-10: 1000 mL

## 2018-09-10 MED ORDER — DIPHENHYDRAMINE HCL 12.5 MG/5ML PO ELIX: 12.5000 mg | ORAL_SOLUTION | ORAL | Status: DC | PRN

## 2018-09-10 MED ORDER — TRANEXAMIC ACID-NACL 1000-0.7 MG/100ML-% IV SOLN
1000.0000 mg | Freq: Once | INTRAVENOUS | Status: AC
  Administered 2018-09-10: 1000 mg via INTRAVENOUS
  Filled 2018-09-10: qty 100

## 2018-09-10 MED ORDER — SUCCINYLCHOLINE CHLORIDE 200 MG/10ML IV SOSY
PREFILLED_SYRINGE | INTRAVENOUS | Status: DC | PRN
  Administered 2018-09-10: 100 mg via INTRAVENOUS

## 2018-09-10 MED ORDER — METOCLOPRAMIDE HCL 5 MG PO TABS: 5.0000 mg | ORAL_TABLET | Freq: Three times a day (TID) | ORAL | Status: DC | PRN

## 2018-09-10 MED ORDER — BUPIVACAINE-EPINEPHRINE (PF) 0.25% -1:200000 IJ SOLN
INTRAMUSCULAR | Status: DC | PRN
  Administered 2018-09-10: 30 mL

## 2018-09-10 MED ORDER — ACETAMINOPHEN 500 MG PO TABS
500.0000 mg | ORAL_TABLET | Freq: Four times a day (QID) | ORAL | Status: AC
  Administered 2018-09-11: 500 mg via ORAL
  Filled 2018-09-10 (×2): qty 1

## 2018-09-10 MED ORDER — MONTELUKAST SODIUM 10 MG PO TABS
10.0000 mg | ORAL_TABLET | Freq: Every day | ORAL | Status: DC
  Administered 2018-09-10: 20:00:00 10 mg via ORAL
  Filled 2018-09-10: qty 1

## 2018-09-10 MED ORDER — SUGAMMADEX SODIUM 200 MG/2ML IV SOLN
INTRAVENOUS | Status: DC | PRN
  Administered 2018-09-10: 200 mg via INTRAVENOUS

## 2018-09-10 MED ORDER — BUPIVACAINE-EPINEPHRINE (PF) 0.25% -1:200000 IJ SOLN
INTRAMUSCULAR | Status: AC
  Filled 2018-09-10: qty 30

## 2018-09-10 MED ORDER — FENTANYL CITRATE (PF) 100 MCG/2ML IJ SOLN
INTRAMUSCULAR | Status: DC | PRN
  Administered 2018-09-10 (×2): 50 ug via INTRAVENOUS
  Administered 2018-09-10: 100 ug via INTRAVENOUS

## 2018-09-10 MED ORDER — HYDROCODONE-ACETAMINOPHEN 5-325 MG PO TABS
1.0000 | ORAL_TABLET | ORAL | Status: DC | PRN
  Administered 2018-09-10 – 2018-09-11 (×4): 2 via ORAL
  Filled 2018-09-10 (×4): qty 2

## 2018-09-10 MED ORDER — MORPHINE SULFATE (PF) 2 MG/ML IV SOLN
0.5000 mg | INTRAVENOUS | Status: DC | PRN
  Administered 2018-09-10: 12:00:00 1 mg via INTRAVENOUS
  Filled 2018-09-10: qty 1

## 2018-09-10 MED ORDER — POLYETHYLENE GLYCOL 3350 17 G PO PACK: 17.0000 g | PACK | Freq: Every day | ORAL | Status: DC | PRN

## 2018-09-10 MED ORDER — STERILE WATER FOR IRRIGATION IR SOLN
Status: DC | PRN
  Administered 2018-09-10: 2000 mL

## 2018-09-10 MED ORDER — DEXAMETHASONE SODIUM PHOSPHATE 10 MG/ML IJ SOLN: 8.0000 mg | Freq: Once | INTRAMUSCULAR | Status: DC

## 2018-09-10 MED ORDER — MOMETASONE FURO-FORMOTEROL FUM 200-5 MCG/ACT IN AERO
2.0000 | INHALATION_SPRAY | Freq: Two times a day (BID) | RESPIRATORY_TRACT | Status: DC
  Filled 2018-09-10: qty 8.8

## 2018-09-10 MED ORDER — PROMETHAZINE HCL 25 MG/ML IJ SOLN
INTRAMUSCULAR | Status: AC
  Filled 2018-09-10: qty 1

## 2018-09-10 MED ORDER — POVIDONE-IODINE 10 % EX SWAB
2.0000 "application " | Freq: Once | CUTANEOUS | Status: AC
  Administered 2018-09-10: 2 via TOPICAL

## 2018-09-10 MED ORDER — LABETALOL HCL 5 MG/ML IV SOLN
INTRAVENOUS | Status: DC | PRN
  Administered 2018-09-10 (×2): 2.5 mg via INTRAVENOUS

## 2018-09-10 MED ORDER — METHOCARBAMOL 500 MG IVPB - SIMPLE MED
INTRAVENOUS | Status: AC
  Filled 2018-09-10: qty 50

## 2018-09-10 MED ORDER — ROCURONIUM BROMIDE 10 MG/ML (PF) SYRINGE
PREFILLED_SYRINGE | INTRAVENOUS | Status: DC | PRN
  Administered 2018-09-10: 50 mg via INTRAVENOUS

## 2018-09-10 MED ORDER — ALBUMIN HUMAN 5 % IV SOLN
INTRAVENOUS | Status: DC | PRN
  Administered 2018-09-10: 09:00:00 via INTRAVENOUS

## 2018-09-10 MED ORDER — DEXAMETHASONE SODIUM PHOSPHATE 10 MG/ML IJ SOLN
INTRAMUSCULAR | Status: DC | PRN
  Administered 2018-09-10: 10 mg via INTRAVENOUS

## 2018-09-10 MED ORDER — MENTHOL 3 MG MT LOZG: 1.0000 | LOZENGE | OROMUCOSAL | Status: DC | PRN

## 2018-09-10 MED ORDER — ONDANSETRON HCL 4 MG/2ML IJ SOLN: 4.0000 mg | Freq: Four times a day (QID) | INTRAMUSCULAR | Status: DC | PRN

## 2018-09-10 SURGICAL SUPPLY — 46 items
BAG DECANTER FOR FLEXI CONT (MISCELLANEOUS) IMPLANT
BAG SPEC THK2 15X12 ZIP CLS (MISCELLANEOUS)
BAG ZIPLOCK 12X15 (MISCELLANEOUS) IMPLANT
BLADE SAG 18X100X1.27 (BLADE) ×2 IMPLANT
COVER PERINEAL POST (MISCELLANEOUS) ×2 IMPLANT
COVER SURGICAL LIGHT HANDLE (MISCELLANEOUS) ×2 IMPLANT
COVER WAND RF STERILE (DRAPES) ×1 IMPLANT
CUP ACETBLR 48 OD SECTOR II (Hips) ×1 IMPLANT
DECANTER SPIKE VIAL GLASS SM (MISCELLANEOUS) ×2 IMPLANT
DRAPE STERI IOBAN 125X83 (DRAPES) ×2 IMPLANT
DRAPE U-SHAPE 47X51 STRL (DRAPES) ×4 IMPLANT
DRSG ADAPTIC 3X8 NADH LF (GAUZE/BANDAGES/DRESSINGS) ×2 IMPLANT
DRSG MEPILEX BORDER 4X4 (GAUZE/BANDAGES/DRESSINGS) ×2 IMPLANT
DRSG MEPILEX BORDER 4X8 (GAUZE/BANDAGES/DRESSINGS) ×2 IMPLANT
DURAPREP 26ML APPLICATOR (WOUND CARE) ×2 IMPLANT
ELECT REM PT RETURN 15FT ADLT (MISCELLANEOUS) ×2 IMPLANT
EVACUATOR 1/8 PVC DRAIN (DRAIN) ×2 IMPLANT
GLOVE BIO SURGEON STRL SZ 6 (GLOVE) IMPLANT
GLOVE BIO SURGEON STRL SZ7 (GLOVE) ×1 IMPLANT
GLOVE BIO SURGEON STRL SZ8 (GLOVE) ×2 IMPLANT
GLOVE BIOGEL PI IND STRL 6.5 (GLOVE) IMPLANT
GLOVE BIOGEL PI IND STRL 7.0 (GLOVE) IMPLANT
GLOVE BIOGEL PI IND STRL 8 (GLOVE) ×1 IMPLANT
GLOVE BIOGEL PI INDICATOR 6.5 (GLOVE)
GLOVE BIOGEL PI INDICATOR 7.0 (GLOVE) ×1
GLOVE BIOGEL PI INDICATOR 8 (GLOVE) ×1
GOWN STRL REUS W/TWL LRG LVL3 (GOWN DISPOSABLE) ×2 IMPLANT
GOWN STRL REUS W/TWL XL LVL3 (GOWN DISPOSABLE) ×1 IMPLANT
HEAD CERAMIC DELTA 28 P1.5 HIP (Head) ×1 IMPLANT
HOLDER FOLEY CATH W/STRAP (MISCELLANEOUS) ×2 IMPLANT
KIT TURNOVER KIT A (KITS) IMPLANT
LINER MARATHON 28 48 (Hips) ×1 IMPLANT
MANIFOLD NEPTUNE II (INSTRUMENTS) ×2 IMPLANT
PACK ANTERIOR HIP CUSTOM (KITS) ×2 IMPLANT
STEM FEM ACTIS HIGH SZ3 (Stem) ×1 IMPLANT
STRIP CLOSURE SKIN 1/2X4 (GAUZE/BANDAGES/DRESSINGS) ×3 IMPLANT
SUT ETHIBOND NAB CT1 #1 30IN (SUTURE) ×2 IMPLANT
SUT MNCRL AB 4-0 PS2 18 (SUTURE) ×2 IMPLANT
SUT STRATAFIX 0 PDS 27 VIOLET (SUTURE) ×4
SUT VIC AB 2-0 CT1 27 (SUTURE) ×4
SUT VIC AB 2-0 CT1 TAPERPNT 27 (SUTURE) ×2 IMPLANT
SUTURE STRATFX 0 PDS 27 VIOLET (SUTURE) ×1 IMPLANT
SYR 50ML LL SCALE MARK (SYRINGE) IMPLANT
TRAY FOLEY MTR SLVR 14FR STAT (SET/KITS/TRAYS/PACK) ×1 IMPLANT
TRAY FOLEY MTR SLVR 16FR STAT (SET/KITS/TRAYS/PACK) ×1 IMPLANT
YANKAUER SUCT BULB TIP 10FT TU (MISCELLANEOUS) ×2 IMPLANT

## 2018-09-10 NOTE — Transfer of Care (Signed)
Immediate Anesthesia Transfer of Care Note  Patient: Brandy Dunlap  Procedure(s) Performed: Procedure(s): TOTAL HIP ARTHROPLASTY ANTERIOR APPROACH (Left)  Patient Location: PACU  Anesthesia Type:General  Level of Consciousness:  sedated, patient cooperative and responds to stimulation  Airway & Oxygen Therapy:Patient Spontanous Breathing and Patient connected to face mask oxgen  Post-op Assessment:  Report given to PACU RN and Post -op Vital signs reviewed and stable  Post vital signs:  Reviewed and stable  Last Vitals:  Vitals:   09/10/18 0658 09/10/18 1025  BP: 140/81 (!) 166/89  Pulse: 79 80  Resp: 18 17  Temp: 36.8 C (!) 36.4 C  SpO2: 92% 49%    Complications: No apparent anesthesia complications

## 2018-09-10 NOTE — Op Note (Signed)
OPERATIVE REPORT- TOTAL HIP ARTHROPLASTY   PREOPERATIVE DIAGNOSIS: Osteoarthritis of the Left hip.   POSTOPERATIVE DIAGNOSIS: Osteoarthritis of the Left  hip.   PROCEDURE: Left total hip arthroplasty, anterior approach.   SURGEON: Gaynelle Arabian, MD   ASSISTANT: Theresa Duty, PA-C  ANESTHESIA:  General  ESTIMATED BLOOD LOSS:-700 mL    DRAINS: Hemovac x1.   COMPLICATIONS: None   CONDITION: PACU - hemodynamically stable.   BRIEF CLINICAL NOTE: Brandy Dunlap is a 68 y.o. female who has advanced end-  stage arthritis of their Left  hip with progressively worsening pain and  dysfunction.The patient has failed nonoperative management and presents for  total hip arthroplasty.   PROCEDURE IN DETAIL: After successful administration of spinal  anesthetic, the traction boots for the [facility] bed were placed on both  feet and the patient was placed onto the [facility] bed, boots placed into the leg  holders. The Left hip was then isolated from the perineum with plastic  drapes and prepped and draped in the usual sterile fashion. ASIS and  greater trochanter were marked and a oblique incision was made, starting  at about 1 cm lateral and 2 cm distal to the ASIS and coursing towards  the anterior cortex of the femur. The skin was cut with a 10 blade  through subcutaneous tissue to the level of the fascia overlying the  tensor fascia lata muscle. The fascia was then incised in line with the  incision at the junction of the anterior third and posterior 2/3rd. The  muscle was teased off the fascia and then the interval between the TFL  and the rectus was developed. The Hohmann retractor was then placed at  the top of the femoral neck over the capsule. The vessels overlying the  capsule were cauterized and the fat on top of the capsule was removed.  A Hohmann retractor was then placed anterior underneath the rectus  femoris to give exposure to the entire anterior capsule. A T-shaped   capsulotomy was performed. The edges were tagged and the femoral head  was identified.       Osteophytes are removed off the superior acetabulum.  The femoral neck was then cut in situ with an oscillating saw. Traction  was then applied to the left lower extremity utilizing the [facility]  traction. The femoral head was then removed. Retractors were placed  around the acetabulum and then circumferential removal of the labrum was  performed. Osteophytes were also removed. Reaming starts at 45 mm to  medialize and  Increased in 2 mm increments to 47 mm. We reamed in  approximately 40 degrees of abduction, 20 degrees anteversion. A 48 mm  pinnacle acetabular shell was then impacted in anatomic position under  fluoroscopic guidance with excellent purchase. We did not need to place  any additional dome screws. A 28 mm neutral + 4 marathon liner was then  placed into the acetabular shell.       The femoral lift was then placed along the lateral aspect of the femur  just distal to the vastus ridge. The leg was  externally rotated and capsule  was stripped off the inferior aspect of the femoral neck down to the  level of the lesser trochanter, this was done with electrocautery. The femur was lifted after this was performed. The  leg was then placed in an extended and adducted position essentially delivering the femur. We also removed the capsule superiorly and the piriformis from the piriformis  fossa to gain excellent exposure of the  proximal femur. Rongeur was used to remove some cancellous bone to get  into the lateral portion of the proximal femur for placement of the  initial starter reamer. The starter broaches was placed  the starter broach  and was shown to go down the center of the canal. Broaching  with the Actis system was then performed starting at size 0  coursing  Up to size 3. A size 3 had excellent torsional and rotational  and axial stability. The trial high offset neck was then placed   with a 28 + 1.5 trial head. The hip was then reduced. We confirmed that  the stem was in the canal both on AP and lateral x-rays. It also has excellent sizing. The hip was reduced with outstanding stability through full extension and full external rotation.. AP pelvis was taken and the leg lengths were measured and found to be equal. Hip was then dislocated again and the femoral head and neck removed. The  femoral broach was removed. Size 3 Actis stem with a high offset  neck was then impacted into the femur following native anteversion. Has  excellent purchase in the canal. Excellent torsional and rotational and  axial stability. It is confirmed to be in the canal on AP and lateral  fluoroscopic views. The 28 + 1.5 ceramic head was placed and the hip  reduced with outstanding stability. Again AP pelvis was taken and it  confirmed that the leg lengths were equal. The wound was then copiously  irrigated with saline solution and the capsule reattached and repaired  with Ethibond suture. 30 ml of .25% Bupivicaine was  injected into the capsule and into the edge of the tensor fascia lata as well as subcutaneous tissue. The fascia overlying the tensor fascia lata was then closed with a running #1 V-Loc. Subcu was closed with interrupted 2-0 Vicryl and subcuticular running 4-0 Monocryl. Incision was cleaned  and dried. Steri-Strips and a bulky sterile dressing applied. Hemovac  drain was hooked to suction and then the patient was awakened and transported to  recovery in stable condition.        Please note that a surgical assistant was a medical necessity for this procedure to perform it in a safe and expeditious manner. Assistant was necessary to provide appropriate retraction of vital neurovascular structures and to prevent femoral fracture and allow for anatomic placement of the prosthesis.  Gaynelle Arabian, M.D.

## 2018-09-10 NOTE — Anesthesia Postprocedure Evaluation (Signed)
Anesthesia Post Note  Patient: Brandy Dunlap  Procedure(s) Performed: TOTAL HIP ARTHROPLASTY ANTERIOR APPROACH (Left Hip)     Patient location during evaluation: PACU Anesthesia Type: General Level of consciousness: awake and alert Pain management: pain level controlled Vital Signs Assessment: post-procedure vital signs reviewed and stable Respiratory status: spontaneous breathing, nonlabored ventilation, respiratory function stable and patient connected to nasal cannula oxygen Cardiovascular status: blood pressure returned to baseline and stable Postop Assessment: no apparent nausea or vomiting Anesthetic complications: no    Last Vitals:  Vitals:   09/10/18 1130 09/10/18 1145  BP: 127/69 132/60  Pulse: 70 73  Resp: 13 12  Temp:  (!) 36.3 C  SpO2: 94% 93%    Last Pain:  Vitals:   09/10/18 1130  TempSrc:   PainSc: 5                  Daviona Herbert,W. EDMOND

## 2018-09-10 NOTE — Evaluation (Signed)
Physical Therapy Evaluation Patient Details Name: Brandy Dunlap MRN: [id number] DOB: June 18, 1950 Today's Date: 09/10/2018   History of Present Illness  68 yo female s/p L THA 7/15. Hx of R TKA, R THA  Clinical Impression  On eval POD 0, pt required Min assist for mobility. She walked ~55 feet with a RW. Moderate pain in groin area with activity. Anticipate pt will progress well during hospital stay. Plan is for d/c home with HEP.     Follow Up Recommendations Follow surgeon's recommendation for DC plan and follow-up therapies(home with HEP)    Equipment Recommendations  None recommended by PT    Recommendations for Other Services       Precautions / Restrictions Precautions Precautions: Fall Restrictions Weight Bearing Restrictions: No LLE Weight Bearing: Weight bearing as tolerated      Mobility  Bed Mobility Overal bed mobility: Needs Assistance Bed Mobility: Supine to Sit     Supine to sit: Min assist;HOB elevated     General bed mobility comments: Assist for L LE. Increased time.  Transfers Overall transfer level: Needs assistance Equipment used: Rolling walker (2 wheeled) Transfers: Sit to/from Stand Sit to Stand: Min assist         General transfer comment: VCS safety, technique, hand placement. Assist to rise, steady.  Ambulation/Gait Ambulation/Gait assistance: Min guard Gait Distance (Feet): 55 Feet Assistive device: Rolling walker (2 wheeled) Gait Pattern/deviations: Step-to pattern;Step-through pattern;Decreased stride length     General Gait Details: close guard for safety. VCs safety, sequence. Slow gait speed.  Stairs            Wheelchair Mobility    Modified Rankin (Stroke Patients Only)       Balance Overall balance assessment: Mild deficits observed, not formally tested                                           Pertinent Vitals/Pain Pain Assessment: 0-10 Pain Score: 8  Pain Location: L groin Pain  Descriptors / Indicators: Tightness;Aching;Discomfort Pain Intervention(s): Monitored during session;Repositioned    Home Living Family/patient expects to be discharged to:: Private residence Living Arrangements: Children Available Help at Discharge: Family Type of Home: House Home Access: Stairs to enter Entrance Stairs-Rails: None Entrance Stairs-Number of Steps: 1 Home Layout: One level;Laundry or work area in [place]: Environmental consultant - 2 wheels;Walker - 4 wheels;Shower seat;Cane - single point      Prior Function Level of Independence: Independent               Hand Dominance        Extremity/Trunk Assessment   Upper Extremity Assessment Upper Extremity Assessment: Overall WFL for tasks assessed    Lower Extremity Assessment Lower Extremity Assessment: (post op weakness 2* L THA)    Cervical / Trunk Assessment Cervical / Trunk Assessment: Normal  Communication   Communication: No difficulties  Cognition Arousal/Alertness: Awake/alert Behavior During Therapy: WFL for tasks assessed/performed Overall Cognitive Status: Within Functional Limits for tasks assessed                                        General Comments      Exercises     Assessment/Plan    PT Assessment Patient needs continued PT services  PT Problem List Decreased strength;Decreased  mobility;Decreased range of motion;Decreased activity tolerance;Decreased balance;Decreased knowledge of use of DME;Pain       PT Treatment Interventions DME instruction;Gait training;Therapeutic activities;Therapeutic exercise;Patient/family education;Balance training;Functional mobility training;Stair training    PT Goals (Current goals can be found in the Care Plan section)  Acute Rehab PT Goals Patient Stated Goal: regain PLOF. Less pain. PT Goal Formulation: With patient Time For Goal Achievement: 09/24/18 Potential to Achieve Goals: Good    Frequency 7X/week   Barriers  to discharge        Co-evaluation               AM-PAC PT "6 Clicks" Mobility  Outcome Measure Help needed turning from your back to your side while in a flat bed without using bedrails?: A Little Help needed moving from lying on your back to sitting on the side of a flat bed without using bedrails?: A Little Help needed moving to and from a bed to a chair (including a wheelchair)?: A Little Help needed standing up from a chair using your arms (e.g., wheelchair or bedside chair)?: A Little Help needed to walk in hospital room?: A Little Help needed climbing 3-5 steps with a railing? : A Little 6 Click Score: 18    End of Session Equipment Utilized During Treatment: Gait belt Activity Tolerance: Patient tolerated treatment well Patient left: in chair;with call bell/phone within reach   PT Visit Diagnosis: Other abnormalities of gait and mobility (R26.89);Pain Pain - Right/Left: Left Pain - part of body: Hip(groin)    Time: 1610-9604 PT Time Calculation (min) (ACUTE ONLY): 23 min   Charges:   PT Evaluation $PT Eval Low Complexity: [place], PT Acute Rehabilitation Services Pager: [phone] Office: [phone]

## 2018-09-10 NOTE — Anesthesia Procedure Notes (Signed)
Procedure Name: Intubation Date/Time: 09/10/2018 8:50 AM Performed by: Lavina Hamman, CRNA Pre-anesthesia Checklist: Patient identified, Emergency Drugs available, Suction available, Patient being monitored and Timeout performed Patient Re-evaluated:Patient Re-evaluated prior to induction Oxygen Delivery Method: Circle system utilized Preoxygenation: Pre-oxygenation with 100% oxygen Induction Type: IV induction Ventilation: Mask ventilation without difficulty Laryngoscope Size: Mac and 3 Grade View: Grade I Tube type: Oral Tube size: 7.0 mm Number of attempts: 1 Airway Equipment and Method: Stylet Placement Confirmation: ETT inserted through vocal cords under direct vision,  positive ETCO2,  CO2 detector and breath sounds checked- equal and bilateral Secured at: 21 cm Tube secured with: Tape Dental Injury: Teeth and Oropharynx as per pre-operative assessment  Comments: ATOI

## 2018-09-10 NOTE — Discharge Instructions (Signed)
°Dr. Frank Aluisio °Total Joint Specialist °[facility] °[address] °[place], [place] [address] °[phone] ° °ANTERIOR APPROACH TOTAL HIP REPLACEMENT POSTOPERATIVE DIRECTIONS ° ° °Hip Rehabilitation, Guidelines Following Surgery  °The results of a hip operation are greatly improved after range of motion and muscle strengthening exercises. Follow all safety measures which are given to protect your hip. If any of these exercises cause increased pain or swelling in your joint, decrease the amount until you are comfortable again. Then slowly increase the exercises. Call your caregiver if you have problems or questions.  ° °HOME CARE INSTRUCTIONS  °• Remove items at home which could result in a fall. This includes throw rugs or furniture in walking pathways.  °· ICE to the affected hip every three hours for 30 minutes at a time and then as needed for pain and swelling.  Continue to use ice on the hip for pain and swelling from surgery. You may notice swelling that will progress down to the foot and ankle.  This is normal after surgery.  Elevate the leg when you are not up walking on it.   °· Continue to use the breathing machine which will help keep your temperature down.  It is common for your temperature to cycle up and down following surgery, especially at night when you are not up moving around and exerting yourself.  The breathing machine keeps your lungs expanded and your temperature down. ° °DIET °You may resume your previous home diet once your are discharged from the hospital. ° °DRESSING / WOUND CARE / SHOWERING °You may change your dressing 3-5 days after surgery.  Then change the dressing every day with sterile gauze.  Please use good hand washing techniques before changing the dressing.  Do not use any lotions or creams on the incision until instructed by your surgeon. °You may start showering once you are discharged home but do not submerge the incision under water. Just pat the  incision dry and apply a dry gauze dressing on daily. °Change the surgical dressing daily and reapply a dry dressing each time. ° °ACTIVITY °Walk with your walker as instructed. °Use walker as long as suggested by your caregivers. °Avoid periods of inactivity such as sitting longer than an hour when not asleep. This helps prevent blood clots.  °You may resume a sexual relationship in one month or when given the OK by your doctor.  °You may return to work once you are cleared by your doctor.  °Do not drive a car for 6 weeks or until released by you surgeon.  °Do not drive while taking narcotics. ° °WEIGHT BEARING °Weight bearing as tolerated with assist device (walker, cane, etc) as directed, use it as long as suggested by your surgeon or therapist, typically at least 4-6 weeks. ° °POSTOPERATIVE CONSTIPATION PROTOCOL °Constipation - defined medically as fewer than three stools per week and severe constipation as less than one stool per week. ° °One of the most common issues patients have following surgery is constipation.  Even if you have a regular bowel pattern at home, your normal regimen is likely to be disrupted due to multiple reasons following surgery.  Combination of anesthesia, postoperative narcotics, change in appetite and fluid intake all can affect your bowels.  In order to avoid complications following surgery, here are some recommendations in order to help you during your recovery period. ° °Colace (docusate) - Pick up an over-the-counter form of Colace or another stool softener and take twice a day   as long as you are requiring postoperative pain medications.  Take with a full glass of water daily.  If you experience loose stools or diarrhea, hold the colace until you stool forms back up.  If your symptoms do not get better within 1 week or if they get worse, check with your doctor. ° °Dulcolax (bisacodyl) - Pick up over-the-counter and take as directed by the product packaging as needed to assist with  the movement of your bowels.  Take with a full glass of water.  Use this product as needed if not relieved by Colace only.  ° °MiraLax (polyethylene glycol) - Pick up over-the-counter to have on hand.  MiraLax is a solution that will increase the amount of water in your bowels to assist with bowel movements.  Take as directed and can mix with a glass of water, juice, soda, coffee, or tea.  Take if you go more than two days without a movement. °Do not use MiraLax more than once per day. Call your doctor if you are still constipated or irregular after using this medication for 7 days in a row. ° °If you continue to have problems with postoperative constipation, please contact the office for further assistance and recommendations.  If you experience "the worst abdominal pain ever" or develop nausea or vomiting, please contact the office immediatly for further recommendations for treatment. ° °ITCHING ° If you experience itching with your medications, try taking only a single pain pill, or even half a pain pill at a time.  You can also use Benadryl over the counter for itching or also to help with sleep.  ° °TED HOSE STOCKINGS °Wear the elastic stockings on both legs for three weeks following surgery during the day but you may remove then at night for sleeping. ° °MEDICATIONS °See your medication summary on the “After Visit Summary” that the nursing staff will review with you prior to discharge.  You may have some home medications which will be placed on hold until you complete the course of blood thinner medication.  It is important for you to complete the blood thinner medication as prescribed by your surgeon.  Continue your approved medications as instructed at time of discharge. ° °PRECAUTIONS °If you experience chest pain or shortness of breath - call 911 immediately for transfer to the hospital emergency department.  °If you develop a fever greater that 101 F, purulent drainage from wound, increased redness or  drainage from wound, foul odor from the wound/dressing, or calf pain - CONTACT YOUR SURGEON.   °                                                °FOLLOW-UP APPOINTMENTS °Make sure you keep all of your appointments after your operation with your surgeon and caregivers. You should call the office at the above phone number and make an appointment for approximately two weeks after the date of your surgery or on the date instructed by your surgeon outlined in the "After Visit Summary". ° °RANGE OF MOTION AND STRENGTHENING EXERCISES  °These exercises are designed to help you keep full movement of your hip joint. Follow your caregiver's or physical therapist's instructions. Perform all exercises about fifteen times, three times per day or as directed. Exercise both hips, even if you have had only one joint replacement. These exercises can be done on   a training (exercise) mat, on the floor, on a table or on a bed. Use whatever works the best and is most comfortable for you. Use music or television while you are exercising so that the exercises are a pleasant break in your day. This will make your life better with the exercises acting as a break in routine you can look forward to.   Lying on your back, slowly slide your foot toward your buttocks, raising your knee up off the floor. Then slowly slide your foot back down until your leg is straight again.   Lying on your back spread your legs as far apart as you can without causing discomfort.   Lying on your side, raise your upper leg and foot straight up from the floor as far as is comfortable. Slowly lower the leg and repeat.   Lying on your back, tighten up the muscle in the front of your thigh (quadriceps muscles). You can do this by keeping your leg straight and trying to raise your heel off the floor. This helps strengthen the largest muscle supporting your knee.   Lying on your back, tighten up the muscles of your buttocks both with the legs straight and with  the knee bent at a comfortable angle while keeping your heel on the floor.   IF YOU ARE TRANSFERRED TO A SKILLED REHAB FACILITY If the patient is transferred to a skilled rehab facility following release from the hospital, a list of the current medications will be sent to the facility for the patient to continue.  When discharged from the skilled rehab facility, please have the facility set up the patient's [place] prior to being released. Also, the skilled facility will be responsible for providing the patient with their medications at time of release from the facility to include their pain medication, the muscle relaxants, and their blood thinner medication. If the patient is still at the rehab facility at time of the two week follow up appointment, the skilled rehab facility will also need to assist the patient in arranging follow up appointment in our office and any transportation needs.  MAKE SURE YOU:   Understand these instructions.   Get help right away if you are not doing well or get worse.    Pick up stool softner and laxative for home use following surgery while on pain medications. Do not submerge incision under water. Please use good hand washing techniques while changing dressing each day. May shower starting three days after surgery. Please use a clean towel to pat the incision dry following showers. Continue to use ice for pain and swelling after surgery. Do not use any lotions or creams on the incision until instructed by your surgeon.

## 2018-09-10 NOTE — Interval H&P Note (Signed)
History and Physical Interval Note:  09/10/2018 8:10 AM  Brandy Dunlap  has presented today for surgery, with the diagnosis of left hip osteoarthritis.  The various methods of treatment have been discussed with the patient and family. After consideration of risks, benefits and other options for treatment, the patient has consented to  Procedure(s): TOTAL HIP ARTHROPLASTY ANTERIOR APPROACH (Left) as a surgical intervention.  The patient's history has been reviewed, patient examined, no change in status, stable for surgery.  I have reviewed the patient's chart and labs.  Questions were answered to the patient's satisfaction.     Pilar Plate [name]

## 2018-09-11 ENCOUNTER — Other Ambulatory Visit: Payer: Self-pay | Admitting: Family

## 2018-09-11 ENCOUNTER — Encounter (HOSPITAL_COMMUNITY): Payer: Self-pay | Admitting: Orthopedic Surgery

## 2018-09-11 LAB — BASIC METABOLIC PANEL
Anion gap: 11 (ref 5–15)
BUN: 9 mg/dL (ref 8–23)
CO2: 20 mmol/L — ABNORMAL LOW (ref 22–32)
Calcium: 8.8 mg/dL — ABNORMAL LOW (ref 8.9–10.3)
Chloride: 107 mmol/L (ref 98–111)
Creatinine, Ser: 0.59 mg/dL (ref 0.44–1.00)
GFR calc Af Amer: >60 mL/min (ref >60)
GFR calc non Af Amer: >60 mL/min (ref >60)
Glucose, Bld: 127 mg/dL — ABNORMAL HIGH (ref 70–99)
Potassium: 3.9 mmol/L (ref 3.5–5.1)
Sodium: 138 mmol/L (ref 135–145)

## 2018-09-11 LAB — CBC
HCT: 31.8 % — ABNORMAL LOW (ref 36.0–46.0)
Hemoglobin: 9.9 g/dL — ABNORMAL LOW (ref 12.0–15.0)
MCH: 27.3 pg (ref 26.0–34.0)
MCHC: 31.1 g/dL (ref 30.0–36.0)
MCV: 87.6 fL (ref 80.0–100.0)
Platelets: 257 10*3/uL (ref 150–400)
RBC: 3.63 MIL/uL — ABNORMAL LOW (ref 3.87–5.11)
RDW: 15.9 % — ABNORMAL HIGH (ref 11.5–15.5)
WBC: 12.1 10*3/uL — ABNORMAL HIGH (ref 4.0–10.5)
nRBC: 0 % (ref 0.0–0.2)

## 2018-09-11 MED ORDER — TRAMADOL HCL 50 MG PO TABS: 50.0000 mg | ORAL_TABLET | Freq: Four times a day (QID) | ORAL | 0 refills | Status: DC | PRN

## 2018-09-11 MED ORDER — METHOCARBAMOL 500 MG PO TABS: 500.0000 mg | ORAL_TABLET | Freq: Four times a day (QID) | ORAL | 0 refills | Status: DC | PRN

## 2018-09-11 MED ORDER — HYDROCODONE-ACETAMINOPHEN 5-325 MG PO TABS: 1.0000 | ORAL_TABLET | Freq: Four times a day (QID) | ORAL | 0 refills | Status: DC | PRN

## 2018-09-11 MED ORDER — ASPIRIN 325 MG PO TBEC: 325.0000 mg | DELAYED_RELEASE_TABLET | Freq: Two times a day (BID) | ORAL | 0 refills | Status: AC

## 2018-09-11 NOTE — Progress Notes (Signed)
Physical Therapy Treatment Patient Details Name: Brandy Dunlap MRN: [id number] DOB: Sep 24, 1950 Today's Date: 09/11/2018    History of Present Illness Pt is a 68 y/o female s/p L THA 7/15. 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    PT Comments    Pt ambulated again in hallway and practiced safe stair technique.  Also reviewed bed mobility with pt performing supervision level.  Pt performed standing exercises as well.  Pt feels ready to d/c home today.  All pt's questions answered within scope of practice.   Follow Up Recommendations  Supervision for mobility/OOB;Follow surgeon's recommendation for DC plan and follow-up therapies     Equipment Recommendations  None recommended by PT    Recommendations for Other Services       Precautions / Restrictions Precautions Precautions: Fall Restrictions LLE Weight Bearing: Weight bearing as tolerated    Mobility  Bed Mobility               General bed mobility comments: pt up in recliner  Transfers Overall transfer level: Needs assistance Equipment used: Rolling walker (2 wheeled) Transfers: Sit to/from Stand Sit to Stand: Supervision         General transfer comment: VCS safety, technique, hand placement  Ambulation/Gait Ambulation/Gait assistance: Supervision Gait Distance (Feet): 160 Feet Assistive device: Rolling walker (2 wheeled) Gait Pattern/deviations: Step-through pattern;Decreased stance time - left;Antalgic     General Gait Details: verbal cues for sequence, RW positioning, step length   Stairs Stairs: Yes Stairs assistance: Min guard Stair Management: Step to pattern;Backwards;With walker Number of Stairs: 3 General stair comments: verbal cues for sequence, RW positioning, safety; pt reports understanding   Wheelchair Mobility    Modified Rankin (Stroke Patients Only)       Balance                                            Cognition  Arousal/Alertness: Awake/alert Behavior During Therapy: WFL for tasks assessed/performed Overall Cognitive Status: Within Functional Limits for tasks assessed                                        Exercises  Standing holding RW: Left active hip flexion, hip abduction, knee flexion, hip extension: 10 repetitions of each exercise.    General Comments        Pertinent Vitals/Pain Pain Assessment: 0-10 Pain Score: 5  Pain Location: L hip incision Pain Descriptors / Indicators: Other (Comment)("pulling") Pain Intervention(s): Monitored during session;Repositioned    Home Living                      Prior Function            PT Goals (current goals can now be found in the care plan section) Progress towards PT goals: Progressing toward goals    Frequency    7X/week      PT Plan Current plan remains appropriate    Co-evaluation              AM-PAC PT "6 Clicks" Mobility   Outcome Measure  Help needed turning from your back to your side while in a flat bed without using bedrails?: A Little Help needed moving from lying  on your back to sitting on the side of a flat bed without using bedrails?: A Little Help needed moving to and from a bed to a chair (including a wheelchair)?: A Little Help needed standing up from a chair using your arms (e.g., wheelchair or bedside chair)?: A Little Help needed to walk in hospital room?: A Little Help needed climbing 3-5 steps with a railing? : A Little 6 Click Score: 18    End of Session Equipment Utilized During Treatment: Gait belt Activity Tolerance: Patient tolerated treatment well Patient left: with call bell/phone within reach;in chair Nurse Communication: Mobility status PT Visit Diagnosis: Other abnormalities of gait and mobility (R26.89)     Time: 1364-3837 PT Time Calculation (min) (ACUTE ONLY): 18 min  Charges:  $Gait Training: 8-22 mins                     Carmelia Bake, PT,  DPT Acute Rehabilitation Services Office: [phone] Pager: [phone]  Trena Platt 09/11/2018, 2:15 PM

## 2018-09-11 NOTE — Progress Notes (Signed)
   Subjective: 1 Day Post-Op Procedure(s) (LRB): TOTAL HIP ARTHROPLASTY ANTERIOR APPROACH (Left) Patient reports pain as mild.   Patient seen in rounds with Dr. Wynelle Link. Patient is well, and has had no acute complaints or problems other than pain in the left hip. No issues overnight. Foley catheter removed this AM. Denies chest pain or SOB. We will continue therapy today.   Objective: Vital signs in last 24 hours: Temp:  [97.4 F (36.3 C)-98.6 F (37 C)] 98.3 F (36.8 C) (07/16 0430) Pulse Rate:  [64-90] 79 (07/16 0725) Resp:  [11-17] 15 (07/16 0725) BP: (113-166)/(57-95) 126/62 (07/16 0430) SpO2:  [91 %-100 %] 91 % (07/16 0725)  Intake/Output from previous day:  Intake/Output Summary (Last 24 hours) at 09/11/2018 0754 Last data filed at 09/11/2018 0635 Gross per 24 hour  Intake 4942.08 ml  Output 3800 ml  Net 1142.08 ml    Labs: Recent Labs    09/11/18 0236  HGB 9.9*   Recent Labs    09/11/18 0236  WBC 12.1*  RBC 3.63*  HCT 31.8*  PLT 257   Recent Labs    09/11/18 0236  NA 138  K 3.9  CL 107  CO2 20*  BUN 9  CREATININE 0.59  GLUCOSE 127*  CALCIUM 8.8*   Exam: General - Patient is Alert and Oriented Extremity - Neurologically intact Neurovascular intact Sensation intact distally Dorsiflexion/Plantar flexion intact Dressing - dressing C/D/I Motor Function - intact, moving foot and toes well on exam.   Past Medical History:  Diagnosis Date  . Allergy   . Asthma   . B12 deficiency   . Back pain   . Bronchitis    hx  . Colon polyp 10/06/2003  . GERD (gastroesophageal reflux disease)   . History of kidney stones    per 09-02-2018 pre-op eval , suspected kidney stone , to [facility] CT abd on 7-9- for further eval   . Hypertension   . Low back pain   . Osteoarthritis   . Pre-diabetes   . Sinus trouble     Assessment/Plan: 1 Day Post-Op Procedure(s) (LRB): TOTAL HIP ARTHROPLASTY ANTERIOR APPROACH (Left) Principal Problem:   OA (osteoarthritis)  of hip  Estimated body mass index is 35.69 kg/m as calculated from the following:   Height as of this encounter: 5\' 3"  (1.6 m).   Weight as of this encounter: 91.4 kg. Advance diet Up with therapy D/C IV fluids  DVT Prophylaxis - Aspirin Weight bearing as tolerated. D/C O2 and pulse ox and try on room air. Hemovac pulled without difficulty, will continue therapy.  Plan is to go Home after hospital stay. Plan for discharge with HEP today as long as meeting goals with therapy. Follow-up in the office in 2 weeks.   Theresa Duty, PA-C Orthopedic Surgery 09/11/2018, 7:54 AM

## 2018-09-11 NOTE — Progress Notes (Signed)
Physical Therapy Progress Note  Clinical Impression: Pt seen for LE strengthening therex. PT provided HEP handout and reviewed with demonstration for progression to standing exercises. Pt tolerated all exercises well with cueing and demo. PT provided pt education re: ice, generalized walking program and car transfers with demonstration. Pt would continue to benefit from skilled physical therapy services at this time while admitted and after d/c to address the below listed limitations in order to improve overall safety and independence with functional mobility.  Sherie Don, [place], DPT  Acute Rehabilitation Services Pager [phone] Office [phone]    09/11/18 0900  PT Visit Information  Last PT Received On 09/11/18  Assistance Needed +1  History of Present Illness Pt is a 68 y/o female s/p L THA 7/15. 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  Precautions  Precautions Fall  Restrictions  Weight Bearing Restrictions Yes  LLE Weight Bearing WBAT  Pain Assessment  Pain Assessment 0-10  Pain Score 4  Pain Location L groin  Pain Descriptors / Indicators Tightness;Aching;Discomfort  Pain Intervention(s) Monitored during session;Repositioned  Cognition  Arousal/Alertness Awake/alert  Behavior During Therapy WFL for tasks assessed/performed  Overall Cognitive Status Within Functional Limits for tasks assessed  Exercises  Exercises Total Joint  Total Joint Exercises  Ankle Circles/Pumps AROM;Both;20 reps;Supine  Quad Sets AROM;Strengthening;Left;10 reps;Supine  Gluteal Sets AROM;Strengthening;Both;10 reps;Supine  Short Arc Quad AROM;Strengthening;Left;10 reps;Supine  Heel Slides AAROM;Left;10 reps;Supine  Hip ABduction/ADduction AAROM;Left;10 reps;Supine  Straight Leg Raises AAROM;Left;10 reps;Supine  PT - End of Session  Activity Tolerance Patient tolerated treatment well  Patient left in bed;with call bell/phone within reach  Nurse  Communication Mobility status   PT - Assessment/Plan  PT Plan Current plan remains appropriate  PT Visit Diagnosis Other abnormalities of gait and mobility (R26.89);Pain  Pain - Right/Left Left  Pain - part of body Hip (groin)  PT Frequency (ACUTE ONLY) 7X/week  Follow Up Recommendations Supervision for mobility/OOB (home with HEP)  PT equipment None recommended by PT  AM-PAC PT "6 Clicks" Mobility Outcome Measure (Version 2)  Help needed turning from your back to your side while in a flat bed without using bedrails? 3  Help needed moving from lying on your back to sitting on the side of a flat bed without using bedrails? 3  Help needed moving to and from a bed to a chair (including a wheelchair)? 3  Help needed standing up from a chair using your arms (e.g., wheelchair or bedside chair)? 3  Help needed to walk in hospital room? 3  Help needed climbing 3-5 steps with a railing?  3  6 Click Score 18  Consider Recommendation of Discharge To: Home with [facility]  PT Goal Progression  Progress towards PT goals Progressing toward goals  Acute Rehab PT Goals  PT Goal Formulation With patient  Time For Goal Achievement 09/24/18  Potential to Achieve Goals Good  PT Time Calculation  PT Start Time (ACUTE ONLY) 0755  PT Stop Time (ACUTE ONLY) 2956  PT Time Calculation (min) (ACUTE ONLY) 16 min  PT General Charges  $$ ACUTE PT VISIT 1 Visit  PT Treatments  $Therapeutic Exercise 8-22 mins

## 2018-09-11 NOTE — Progress Notes (Signed)
Pt was provided with d/c instructions. After discussing the pt's plan of care, the pt reported no further questions or concerns.

## 2018-09-11 NOTE — Progress Notes (Signed)
Physical Therapy Treatment Patient Details Name: Brandy Dunlap MRN: [id number] DOB: 1950-12-02 Today's Date: 09/11/2018    History of Present Illness Pt is a 68 y/o female s/p L THA 7/15. 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    PT Comments    Pt ambulated in hallway and practiced one step.  Pt reports actually having 2 steps at home so will return for afternoon session and ambulate and practice steps again.    Follow Up Recommendations  Supervision for mobility/OOB;Follow surgeon's recommendation for DC plan and follow-up therapies     Equipment Recommendations  None recommended by PT    Recommendations for Other Services       Precautions / Restrictions Precautions Precautions: Fall Restrictions LLE Weight Bearing: Weight bearing as tolerated    Mobility  Bed Mobility               General bed mobility comments: pt sitting EOB with nurse tech  Transfers Overall transfer level: Needs assistance Equipment used: Rolling walker (2 wheeled) Transfers: Sit to/from Stand Sit to Stand: Min guard         General transfer comment: VCS safety, technique, hand placement  Ambulation/Gait Ambulation/Gait assistance: Min guard Gait Distance (Feet): 160 Feet Assistive device: Rolling walker (2 wheeled) Gait Pattern/deviations: Step-to pattern;Step-through pattern;Decreased stance time - left;Antalgic     General Gait Details: verbal cues for sequence, RW positioning, step length   Stairs Stairs: Yes Stairs assistance: Min guard Stair Management: Step to pattern;Forwards;Backwards;With walker Number of Stairs: 1 General stair comments: performed one step with RW both forwards and backwards, cues for safety and sequence   Wheelchair Mobility    Modified Rankin (Stroke Patients Only)       Balance                                            Cognition Arousal/Alertness: Awake/alert Behavior During  Therapy: WFL for tasks assessed/performed Overall Cognitive Status: Within Functional Limits for tasks assessed                                        Exercises      General Comments        Pertinent Vitals/Pain Pain Assessment: 0-10 Pain Score: 4  Pain Location: L groin Pain Descriptors / Indicators: Tightness;Aching;Discomfort Pain Intervention(s): Limited activity within patient's tolerance;Monitored during session;Repositioned    Home Living                      Prior Function            PT Goals (current goals can now be found in the care plan section) Progress towards PT goals: Progressing toward goals    Frequency    7X/week      PT Plan Current plan remains appropriate    Co-evaluation              AM-PAC PT "6 Clicks" Mobility   Outcome Measure  Help needed turning from your back to your side while in a flat bed without using bedrails?: A Little Help needed moving from lying on your back to sitting on the side of a flat bed without using bedrails?: A Little Help needed moving  to and from a bed to a chair (including a wheelchair)?: A Little Help needed standing up from a chair using your arms (e.g., wheelchair or bedside chair)?: A Little Help needed to walk in hospital room?: A Little Help needed climbing 3-5 steps with a railing? : A Little 6 Click Score: 18    End of Session Equipment Utilized During Treatment: Gait belt Activity Tolerance: Patient tolerated treatment well Patient left: with call bell/phone within reach;in chair Nurse Communication: Mobility status PT Visit Diagnosis: Other abnormalities of gait and mobility (R26.89)     Time: 2505-3976 PT Time Calculation (min) (ACUTE ONLY): 17 min  Charges:  $Gait Training: 8-22 mins                    Carmelia Bake, PT, DPT [place] Office: [phone] Pager: [place] 09/11/2018, 2:12 PM

## 2018-09-15 ENCOUNTER — Ambulatory Visit: Payer: Medicare Other | Admitting: Family

## 2018-09-15 NOTE — Discharge Summary (Signed)
Physician Discharge Summary   Patient ID: Brandy Dunlap MRN: [id number] DOB/AGE: 1950-07-14 68 y.o.  Admit date: 09/10/2018 Discharge date: 09/11/2018  Primary Diagnosis: Osteoarthritis, left hip   Admission Diagnoses:  Past Medical History:  Diagnosis Date   Allergy    Asthma    B12 deficiency    Back pain    Bronchitis    hx   Colon polyp 10/06/2003   GERD (gastroesophageal reflux disease)    History of kidney stones    per 09-02-2018 pre-op eval , suspected kidney stone , to undersgo CT abd on 7-9- for further eval    Hypertension    Low back pain    Osteoarthritis    Pre-diabetes    Sinus trouble    Discharge Diagnoses:   Principal Problem:   OA (osteoarthritis) of hip  Estimated body mass index is 35.69 kg/m as calculated from the following:   Height as of this encounter: 5\' 3"  (1.6 m).   Weight as of this encounter: 91.4 kg.  Procedure:  Procedure(s) (LRB): TOTAL HIP ARTHROPLASTY ANTERIOR APPROACH (Left)   Consults: None  HPI: Brandy Dunlap is a 68 y.o. female who has advanced end-stage arthritis of their Left  hip with progressively worsening pain and dysfunction.The patient has failed nonoperative management and presents for total hip arthroplasty.   Laboratory Data: Admission on 09/10/2018, Discharged on 09/11/2018  Component Date Value Ref Range Status   WBC 09/11/2018 12.1* 4.0 - 10.5 K/uL Final   RBC 09/11/2018 3.63* 3.87 - 5.11 MIL/uL Final   Hemoglobin 09/11/2018 9.9* 12.0 - 15.0 g/dL Final   HCT 09/11/2018 31.8* 36.0 - 46.0 % Final   MCV 09/11/2018 87.6  80.0 - 100.0 fL Final   MCH 09/11/2018 27.3  26.0 - 34.0 pg Final   MCHC 09/11/2018 31.1  30.0 - 36.0 g/dL Final   RDW 09/11/2018 15.9* 11.5 - 15.5 % Final   Platelets 09/11/2018 257  150 - 400 K/uL Final   nRBC 09/11/2018 0.0  0.0 - 0.2 % Final   Performed at [facility], [place] [address] [place], [place] [address]   Sodium 09/11/2018 138  135 - 145  mmol/L Final   Potassium 09/11/2018 3.9  3.5 - 5.1 mmol/L Final   Chloride 09/11/2018 107  98 - 111 mmol/L Final   CO2 09/11/2018 20* 22 - 32 mmol/L Final   Glucose, Bld 09/11/2018 127* 70 - 99 mg/dL Final   BUN 09/11/2018 9  8 - 23 mg/dL Final   Creatinine, Ser 09/11/2018 0.59  0.44 - 1.00 mg/dL Final   Calcium 09/11/2018 8.8* 8.9 - 10.3 mg/dL Final   GFR calc non Af Amer 09/11/2018 >60  >60 mL/min Final   GFR calc Af Amer 09/11/2018 >60  >60 mL/min Final   Anion gap 09/11/2018 11  5 - 15 Final   Performed at [facility], [place] [address] [place], [place] [address]  Hospital Outpatient Visit on 09/06/2018  Component Date Value Ref Range Status   SARS Coronavirus 2 09/06/2018 NEGATIVE  NEGATIVE Final   Comment: (NOTE) SARS-CoV-2 target nucleic acids are NOT DETECTED. The SARS-CoV-2 RNA is generally detectable in upper and lower respiratory specimens during the acute phase of infection. Negative results do not preclude SARS-CoV-2 infection, do not rule out co-infections with other pathogens, and should not be used as the sole basis for treatment or other patient management decisions. Negative results must be combined with clinical observations, patient history, and epidemiological information. The  expected result is Negative. Fact Sheet for Patients: SugarRoll.be Fact Sheet for Healthcare Providers: https://www.woods-mathews.com/ This test is not yet approved or cleared by the [place] FDA and  has been authorized for detection and/or diagnosis of SARS-CoV-2 by FDA under an Emergency Use Authorization (EUA). This EUA will remain  in effect (meaning this test can be used) for the duration of the COVID-19 declaration under Section 56                          4(b)(1) of the Act, 21 U.S.C. section 360bbb-3(b)(1), unless the authorization is terminated or revoked sooner. Performed at [facility],  [place] [address] [place], [place] [address]   Hospital Outpatient Visit on 09/04/2018  Component Date Value Ref Range Status   Glucose-Capillary 09/04/2018 119* 70 - 99 mg/dL Final   Hgb A1c MFr Bld 09/04/2018 6.1* 4.8 - 5.6 % Final   Comment: (NOTE)         Prediabetes: 5.7 - 6.4         Diabetes: >6.4         Glycemic control for adults with diabetes: <7.0    Mean Plasma Glucose 09/04/2018 128  mg/dL Final   Comment: (NOTE) Performed At: [facility] [place], [place] [id number] Rush Farmer MD VO:[id number]    aPTT 09/04/2018 28  24 - 36 seconds Final   Performed at [facility], [place] [address] [place], [place] [address]   ABO/RH(D) 09/04/2018 A POS   Final   Antibody Screen 09/04/2018 NEG   Final   Sample Expiration 09/04/2018 09/13/2018,2359   Final   Extend sample reason 09/04/2018    Final                   Value:NO TRANSFUSIONS OR PREGNANCY IN THE PAST 3 MONTHS Performed at [place] [address] [place], [place] [address]    MRSA, PCR 09/04/2018 NEGATIVE  NEGATIVE Final   Staphylococcus aureus 09/04/2018 NEGATIVE  NEGATIVE Final   Comment: (NOTE) The Xpert SA Assay (FDA approved for NASAL specimens in patients 79 years of age and older), is one component of a comprehensive surveillance program. It is not intended to diagnose infection nor to guide or monitor treatment. Performed at [facility], [place] [address], [place], [place] [address]    ABO/RH(D) 09/04/2018    Final                   Value:A POS Performed at [facility], [place] [address], [place], [place] [address]   Office Visit on 09/02/2018  Component Date Value Ref Range Status   Sodium 09/02/2018 140  135 - 145 mEq/L Final   Potassium 09/02/2018 4.2  3.5 - 5.1 mEq/L Final   Chloride 09/02/2018 104  96 - 112 mEq/L Final   CO2 09/02/2018 27  19 - 32 mEq/L Final   Glucose, Bld 09/02/2018 82   70 - 99 mg/dL Final   BUN 09/02/2018 10  6 - 23 mg/dL Final   Creatinine, Ser 09/02/2018 0.76  0.40 - 1.20 mg/dL Final   Calcium 09/02/2018 9.4  8.4 - 10.5 mg/dL Final   GFR 09/02/2018 75.72  >60.00 mL/min Final   WBC 09/02/2018 8.2  4.0 - 10.5 K/uL Final   RBC 09/02/2018 4.60  3.87 - 5.11 Mil/uL Final   Hemoglobin 09/02/2018 12.7  12.0 - 15.0 g/dL Final  HCT 09/02/2018 39.0  36.0 - 46.0 % Final   MCV 09/02/2018 84.8  78.0 - 100.0 fl Final   MCHC 09/02/2018 32.5  30.0 - 36.0 g/dL Final   RDW 09/02/2018 16.2* 11.5 - 15.5 % Final   Platelets 09/02/2018 299.0  150.0 - 400.0 K/uL Final   Neutrophils Relative % 09/02/2018 49.9  43.0 - 77.0 % Final   Lymphocytes Relative 09/02/2018 37.3  12.0 - 46.0 % Final   Monocytes Relative 09/02/2018 6.6  3.0 - 12.0 % Final   Eosinophils Relative 09/02/2018 5.4* 0.0 - 5.0 % Final   Basophils Relative 09/02/2018 0.8  0.0 - 3.0 % Final   Neutro Abs 09/02/2018 4.1  1.4 - 7.7 K/uL Final   Lymphs Abs 09/02/2018 3.0  0.7 - 4.0 K/uL Final   Monocytes Absolute 09/02/2018 0.5  0.1 - 1.0 K/uL Final   Eosinophils Absolute 09/02/2018 0.4  0.0 - 0.7 K/uL Final   Basophils Absolute 09/02/2018 0.1  0.0 - 0.1 K/uL Final   Total Bilirubin 09/02/2018 0.3  0.2 - 1.2 mg/dL Final   Bilirubin, Direct 09/02/2018 0.0  0.0 - 0.3 mg/dL Final   Alkaline Phosphatase 09/02/2018 74  39 - 117 U/L Final   AST 09/02/2018 26  0 - 37 U/L Final   ALT 09/02/2018 32  0 - 35 U/L Final   Total Protein 09/02/2018 7.3  6.0 - 8.3 g/dL Final   Albumin 09/02/2018 4.3  3.5 - 5.2 g/dL Final   Color, Urine 09/02/2018 YELLOW  Yellow;Lt. Yellow;Straw;Dark Yellow;Amber;Green;Red;Brown Final   APPearance 09/02/2018 CLEAR  Clear;Turbid;Slightly Cloudy;Cloudy Final   Specific Gravity, Urine 09/02/2018 1.010  1.000 - 1.030 Final   pH 09/02/2018 6.5  5.0 - 8.0 Final   Total Protein, Urine 09/02/2018 NEGATIVE  Negative Final   Urine Glucose 09/02/2018 NEGATIVE   Negative Final   Ketones, ur 09/02/2018 NEGATIVE  Negative Final   Bilirubin Urine 09/02/2018 NEGATIVE  Negative Final   Hgb urine dipstick 09/02/2018 NEGATIVE  Negative Final   Urobilinogen, UA 09/02/2018 0.2  0.0 - 1.0 Final   Leukocytes,Ua 09/02/2018 NEGATIVE  Negative Final   Nitrite 09/02/2018 NEGATIVE  Negative Final   WBC, UA 09/02/2018 0-2/hpf  0-2/hpf Final   RBC / HPF 09/02/2018 none seen  0-2/hpf Final   Squamous Epithelial / LPF 09/02/2018 Rare(0-4/hpf)  Rare(0-4/hpf) Final   INR 09/02/2018 0.9  0.8 - 1.0 ratio Final   Prothrombin Time 09/02/2018 11.1  9.6 - 13.1 sec Final   Vitamin D 1, 25 (OH)2 Total 09/02/2018 46  18 - 72 pg/mL Final   Vitamin D3 1, 25 (OH)2 09/02/2018 46  pg/mL Final   Vitamin D2 1, 25 (OH)2 09/02/2018 <8  pg/mL Final   Comment: [name] Vitamin D3, 1,25(OH)2 indicates both endogenous production and supplementation. Vitamin D2, 1,25(OH)2 is an indicator of exogenous sources, such as diet or supplementation.  Interpretation and therapy are based on measurement of Vitamin D,1,25(OH)2, Total. . . This test was developed and its analytical performance characteristics have been determined by [facility], [place], [place]. It has not been cleared or approved by the FDA. This assay has been validated pursuant to the CLIA regulations and is used for clinical purposes. [name]      X-Rays:Dg Chest 2 View  Result Date: 09/02/2018 CLINICAL DATA:  Preop evaluation.  Left hip replacement.  Pain. EXAM: CHEST - 2 VIEW COMPARISON:  April 28, 2018 FINDINGS: There are atherosclerotic changes of the abdominal aorta. The heart size is stable from  prior study. There is no pneumothorax. No large pleural effusion. No acute osseous abnormality. IMPRESSION: No active cardiopulmonary disease. Electronically Signed   By: Constance Holster M.D.   On: 09/02/2018 20:56   Dg Pelvis Portable  Result Date: 09/10/2018 CLINICAL DATA:  Status post left hip  replacement today. EXAM: PORTABLE PELVIS 1-2 VIEWS COMPARISON:  Intraoperative imaging earlier today. FINDINGS: Total left hip replacement is in place. The device is located. No fracture. Surgical drain and some gas in the soft tissues noted. Remote right hip replacement noted. IMPRESSION: Status post left hip replacement today.  No acute finding. Electronically Signed   By: Inge Rise M.D.   On: 09/10/2018 11:52   Dg Abd 2 Views  Result Date: 09/02/2018 CLINICAL DATA:  Right-sided flank pain. EXAM: ABDOMEN - 2 VIEW COMPARISON:  CT dated August 14, 2017. FINDINGS: The patient appears to be status post cholecystectomy. The patient is status post prior L4-L5 posterior fusion. There are multiple calcifications that project over the patient's pelvis. The patient is status post total hip arthroplasty on the right. There are degenerative changes of the left hip. IMPRESSION: 1. No definite radiopaque nephroliths. 2. Multiple calcifications project over the patient's pelvis which are statistically most likely to represent phleboliths. A distal ureteral stone is not excluded. Of note, there were no radiopaque nephroliths identified on the patient's CT from August 14, 2017. 3. Status post total hip arthroplasty on the right. There are moderate degenerative changes of the left hip. Electronically Signed   By: Constance Holster M.D.   On: 09/02/2018 20:58   Dg C-arm 1-60 Min-no Report  Result Date: 09/10/2018 Fluoroscopy was utilized by the requesting physician.  No radiographic interpretation.   Ct Renal Stone Study  Result Date: 09/04/2018 CLINICAL DATA:  History of kidney stones. Low back pain for 2 weeks. EXAM: CT ABDOMEN AND PELVIS WITHOUT CONTRAST TECHNIQUE: Multidetector CT imaging of the abdomen and pelvis was performed following the standard protocol without IV contrast. COMPARISON:  CT abdomen dated 08/14/2017. FINDINGS: Lower chest: No acute abnormality. Hepatobiliary: No focal liver abnormality is seen.  Status post cholecystectomy. No biliary dilatation. Pancreas: Unremarkable. No pancreatic ductal dilatation or surrounding inflammatory changes. Spleen: Normal in size without focal abnormality. Adrenals/Urinary Tract: Adrenal glands appear normal. Kidneys are unremarkable without mass, stone or hydronephrosis. No perinephric fluid. No ureteral or bladder calculi identified. Bladder is decompressed. Stomach/Bowel: No dilated large or small bowel loops. Scattered mild diverticulosis of the descending and sigmoid colon but no focal inflammatory change to suggest acute diverticulitis. Appendix is normal. Stomach is unremarkable, partially decompressed. Vascular/Lymphatic: Mild aortic atherosclerosis. No enlarged lymph nodes seen within the abdomen or pelvis. Reproductive: Uterus and bilateral adnexa are unremarkable. Other: No free fluid or abscess collection seen. No free intraperitoneal air. Musculoskeletal: Posterior lumbar fixation hardware at L4 and L5 appears appropriately position. Degenerative spondylosis throughout the lumbar spine, mild to moderate in degree. RIGHT hip arthroplasty hardware appears appropriately positioned. No acute or suspicious osseous finding. Superficial soft tissues of the flank are unremarkable. IMPRESSION: 1. No acute findings within the abdomen or pelvis. No renal or ureteral calculi identified. No hydronephrosis. 2. Mild aortic atherosclerosis. 3. Diverticulosis without evidence of acute diverticulitis. Aortic Atherosclerosis (ICD10-I70.0). Electronically Signed   By: Franki Cabot M.D.   On: 09/04/2018 16:03   Dg Hip Operative Unilat W Or W/o Pelvis Left  Result Date: 09/10/2018 CLINICAL DATA:  Intraoperative imaging for left hip replacement. EXAM: OPERATIVE LEFT HIP (WITH PELVIS IF PERFORMED) 2 VIEWS TECHNIQUE:  Fluoroscopic spot image(s) were submitted for interpretation post-operatively. COMPARISON:  None. FINDINGS: Two fluoroscopic spot views of the low pelvis and left hip  are provided. On the first image, acetabular cup and femoral stem are in place. On the second image, femoral head has been placed. No acute abnormality. Prior right hip replacement noted. IMPRESSION: Intraoperative imaging for left hip replacement. Electronically Signed   By: Inge Rise M.D.   On: 09/10/2018 12:35    EKG: Orders placed or performed in visit on 07/30/18   EKG 12-Lead     Hospital Course: Brandy Dunlap is a 68 y.o. who was [facility] to [facility]. They were brought to the operating room on 09/10/2018 and underwent Procedure(s): [place].  Patient tolerated the procedure well and was later transferred to the recovery room and then to the orthopaedic floor for postoperative care. They were given PO and IV analgesics for pain control following their surgery. They were given 24 hours of postoperative antibiotics of  Anti-infectives (From admission, onward)   Start     Dose/Rate Route Frequency Ordered Stop   09/10/18 1400  ceFAZolin (ANCEF) IVPB 2g/100 mL premix     2 g 200 mL/hr over 30 Minutes Intravenous Every 6 hours 09/10/18 1200 09/10/18 2038   09/10/18 0645  ceFAZolin (ANCEF) IVPB 2g/100 mL premix     2 g 200 mL/hr over 30 Minutes Intravenous On call to O.R. 09/10/18 [id number] 09/10/18 4431     and started on DVT prophylaxis in the form of Aspirin.   PT and OT were ordered for total joint protocol. Discharge planning consulted to help with postop disposition and equipment needs.  Patient had a good night on the evening of surgery. They started to get up OOB with therapy on POD #0. Pt was seen during rounds and was ready to go home pending progress with therapy. Hemovac drain was pulled without difficulty. She worked with therapy on POD #1 and was meeting her goals. Pt was discharged to home later that day in stable condition.  Diet: Regular diet Activity: WBAT Follow-up: in 2 weeks Disposition: Home with HEP Discharged Condition:  stable   Discharge Instructions    Call MD / Call 911   Complete by: As directed    If you experience chest pain or shortness of breath, CALL 911 and be transported to the hospital emergency room.  If you develope a fever above 101 F, pus (white drainage) or increased drainage or redness at the wound, or calf pain, call your surgeon's office.   Change dressing   Complete by: As directed    You may change your dressing on Friday, then change the dressing daily with sterile 4 x 4 inch gauze dressing and paper tape.   Constipation Prevention   Complete by: As directed    Drink plenty of fluids.  Prune juice may be helpful.  You may use a stool softener, such as Colace (over the counter) 100 mg twice a day.  Use MiraLax (over the counter) for constipation as needed.   Diet - low sodium heart healthy   Complete by: As directed    Discharge instructions   Complete by: As directed    Dr. Gaynelle Arabian Total Joint Specialist [facility] [address], [place], [place] [address] [phone]  ANTERIOR APPROACH TOTAL HIP REPLACEMENT POSTOPERATIVE DIRECTIONS   Hip Rehabilitation, Guidelines Following Surgery  The results of a hip operation are greatly improved after range of  motion and muscle strengthening exercises. Follow all safety measures which are given to protect your hip. If any of these exercises cause increased pain or swelling in your joint, decrease the amount until you are comfortable again. Then slowly increase the exercises. Call your caregiver if you have problems or questions.   HOME CARE INSTRUCTIONS  Remove items at home which could result in a fall. This includes throw rugs or furniture in walking pathways.  ICE to the affected hip every three hours for 30 minutes at a time and then as needed for pain and swelling.  Continue to use ice on the hip for pain and swelling from surgery. You may notice swelling that will progress down to the foot and ankle.  This is  normal after surgery.  Elevate the leg when you are not up walking on it.   Continue to use the breathing machine which will help keep your temperature down.  It is common for your temperature to cycle up and down following surgery, especially at night when you are not up moving around and exerting yourself.  The breathing machine keeps your lungs expanded and your temperature down.  DIET You may resume your previous home diet once your are discharged from the hospital.  DRESSING / WOUND CARE / SHOWERING You may change your dressing 3-5 days after surgery.  Then change the dressing every day with sterile gauze.  Please use good hand washing techniques before changing the dressing.  Do not use any lotions or creams on the incision until instructed by your surgeon. You may start showering once you are discharged home but do not submerge the incision under water. Just pat the incision dry and apply a dry gauze dressing on daily. Change the surgical dressing daily and reapply a dry dressing each time.  ACTIVITY Walk with your walker as instructed. Use walker as long as suggested by your caregivers. Avoid periods of inactivity such as sitting longer than an hour when not asleep. This helps prevent blood clots.  You may resume a sexual relationship in one month or when given the OK by your doctor.  You may return to work once you are cleared by your doctor.  Do not drive a car for 6 weeks or until released by you surgeon.  Do not drive while taking narcotics.  WEIGHT BEARING Weight bearing as tolerated with assist device (walker, cane, etc) as directed, use it as long as suggested by your surgeon or therapist, typically at least 4-6 weeks.  POSTOPERATIVE CONSTIPATION PROTOCOL Constipation - defined medically as fewer than three stools per week and severe constipation as less than one stool per week.  One of the most common issues patients have following surgery is constipation.  Even if you have  a regular bowel pattern at home, your normal regimen is likely to be disrupted due to multiple reasons following surgery.  Combination of anesthesia, postoperative narcotics, change in appetite and fluid intake all can affect your bowels.  In order to avoid complications following surgery, here are some recommendations in order to help you during your recovery period.  Colace (docusate) - Pick up an over-the-counter form of Colace or another stool softener and take twice a day as long as you are requiring postoperative pain medications.  Take with a full glass of water daily.  If you experience loose stools or diarrhea, hold the colace until you stool forms back up.  If your symptoms do not get better within 1 week or if  they get worse, check with your doctor.  Dulcolax (bisacodyl) - Pick up over-the-counter and take as directed by the product packaging as needed to assist with the movement of your bowels.  Take with a full glass of water.  Use this product as needed if not relieved by Colace only.   MiraLax (polyethylene glycol) - Pick up over-the-counter to have on hand.  MiraLax is a solution that will increase the amount of water in your bowels to assist with bowel movements.  Take as directed and can mix with a glass of water, juice, soda, coffee, or tea.  Take if you go more than two days without a movement. Do not use MiraLax more than once per day. Call your doctor if you are still constipated or irregular after using this medication for 7 days in a row.  If you continue to have problems with postoperative constipation, please contact the office for further assistance and recommendations.  If you experience "the worst abdominal pain ever" or develop nausea or vomiting, please contact the office immediatly for further recommendations for treatment.  ITCHING  If you experience itching with your medications, try taking only a single pain pill, or even half a pain pill at a time.  You can also use  Benadryl over the counter for itching or also to help with sleep.   TED HOSE STOCKINGS Wear the elastic stockings on both legs for three weeks following surgery during the day but you may remove then at night for sleeping.  MEDICATIONS See your medication summary on the "After Visit Summary" that the nursing staff will review with you prior to discharge.  You may have some home medications which will be placed on hold until you complete the course of blood thinner medication.  It is important for you to complete the blood thinner medication as prescribed by your surgeon.  Continue your approved medications as instructed at time of discharge.  PRECAUTIONS If you experience chest pain or shortness of breath - call 911 immediately for transfer to the hospital emergency department.  If you develop a fever greater that 101 F, purulent drainage from wound, increased redness or drainage from wound, foul odor from the wound/dressing, or calf pain - CONTACT YOUR SURGEON.                                                   FOLLOW-UP APPOINTMENTS Make sure you keep all of your appointments after your operation with your surgeon and caregivers. You should call the office at the above phone number and make an appointment for approximately two weeks after the date of your surgery or on the date instructed by your surgeon outlined in the "After Visit Summary".  RANGE OF MOTION AND STRENGTHENING EXERCISES  These exercises are designed to help you keep full movement of your hip joint. Follow your caregiver's or physical therapist's instructions. Perform all exercises about fifteen times, three times per day or as directed. Exercise both hips, even if you have had only one joint replacement. These exercises can be done on a training (exercise) mat, on the floor, on a table or on a bed. Use whatever works the best and is most comfortable for you. Use music or television while you are exercising so that the exercises are  a pleasant break in your day. This will make your  life better with the exercises acting as a break in routine you can look forward to.  Lying on your back, slowly slide your foot toward your buttocks, raising your knee up off the floor. Then slowly slide your foot back down until your leg is straight again.  Lying on your back spread your legs as far apart as you can without causing discomfort.  Lying on your side, raise your upper leg and foot straight up from the floor as far as is comfortable. Slowly lower the leg and repeat.  Lying on your back, tighten up the muscle in the front of your thigh (quadriceps muscles). You can do this by keeping your leg straight and trying to raise your heel off the floor. This helps strengthen the largest muscle supporting your knee.  Lying on your back, tighten up the muscles of your buttocks both with the legs straight and with the knee bent at a comfortable angle while keeping your heel on the floor.   IF YOU ARE TRANSFERRED TO A SKILLED REHAB FACILITY If the patient is transferred to a skilled rehab facility following release from the hospital, a list of the current medications will be sent to the facility for the patient to continue.  When discharged from the skilled rehab facility, please have the facility set up the patient's [place] prior to being released. Also, the skilled facility will be responsible for providing the patient with their medications at time of release from the facility to include their pain medication, the muscle relaxants, and their blood thinner medication. If the patient is still at the rehab facility at time of the two week follow up appointment, the skilled rehab facility will also need to assist the patient in arranging follow up appointment in our office and any transportation needs.  MAKE SURE YOU:  Understand these instructions.  Get help right away if you are not doing well or get worse.    Pick up stool  softner and laxative for home use following surgery while on pain medications. Do not submerge incision under water. Please use good hand washing techniques while changing dressing each day. May shower starting three days after surgery. Please use a clean towel to pat the incision dry following showers. Continue to use ice for pain and swelling after surgery. Do not use any lotions or creams on the incision until instructed by your surgeon.   Do not sit on low chairs, stoools or toilet seats, as it may be difficult to get up from low surfaces   Complete by: As directed    Driving restrictions   Complete by: As directed    No driving for two weeks   TED hose   Complete by: As directed    Use stockings (TED hose) for three weeks on both leg(s).  You may remove them at night for sleeping.   Weight bearing as tolerated   Complete by: As directed      Allergies as of 09/11/2018      Reactions   Pantoprazole Sodium Palpitations      Medication List    TAKE these medications   aspirin 325 MG EC tablet Take 1 tablet (325 mg total) by mouth 2 (two) times daily for 20 days. Then take one 81 mg aspirin once a day for three weeks. Then discontinue aspirin.   budesonide-formoterol 160-4.5 MCG/ACT inhaler Commonly known as: SYMBICORT Inhale 2 puffs into the lungs 2 (two) times daily.   cephALEXin 500 MG  capsule Commonly known as: KEFLEX Take 1 capsule (500 mg total) by mouth 2 (two) times daily.   Dexilant 60 MG capsule Generic drug: dexlansoprazole TAKE 1 CAPSULE BY MOUTH EVERY DAY What changed: how much to take   HYDROcodone-acetaminophen 5-325 MG tablet Commonly known as: NORCO/VICODIN Take 1-2 tablets by mouth every 6 (six) hours as needed for severe pain.   loratadine 10 MG tablet Commonly known as: CLARITIN Take 1 tablet (10 mg total) by mouth daily.   methocarbamol 500 MG tablet Commonly known as: ROBAXIN Take 1 tablet (500 mg total) by mouth every 6 (six) hours as needed  for muscle spasms.   montelukast 10 MG tablet Commonly known as: SINGULAIR Take 10 mg by mouth at bedtime.   traMADol 50 MG tablet Commonly known as: ULTRAM Take 1-2 tablets (50-100 mg total) by mouth every 6 (six) hours as needed for moderate pain.   Vitamin D3 75 MCG (3000 UT) Tabs Take 1 tablet by mouth once daily.   Xolair 150 MG injection Generic drug: omalizumab Inject 150 mg into the skin every 14 (fourteen) days.            Discharge Care Instructions  (From admission, onward)         Start     Ordered   09/11/18 0000  Weight bearing as tolerated     09/11/18 0757   09/11/18 0000  Change dressing    Comments: You may change your dressing on Friday, then change the dressing daily with sterile 4 x 4 inch gauze dressing and paper tape.   09/11/18 0757         Follow-up Information    Gaynelle Arabian, MD. Schedule an appointment as soon as possible for a visit on 09/25/2018.   Specialty: Orthopedic Surgery Contact information: [address] [place] [address] [phone]           Signed: Theresa Duty, PA-C Orthopedic Surgery 09/15/2018, 10:33 AM

## 2018-11-13 ENCOUNTER — Encounter: Payer: Self-pay | Admitting: Family

## 2018-12-19 ENCOUNTER — Other Ambulatory Visit: Payer: Self-pay

## 2018-12-19 ENCOUNTER — Ambulatory Visit (INDEPENDENT_AMBULATORY_CARE_PROVIDER_SITE_OTHER): Payer: Medicare Other

## 2018-12-19 DIAGNOSIS — Z23 Encounter for immunization: Secondary | ICD-10-CM | POA: Diagnosis not present

## 2018-12-22 ENCOUNTER — Other Ambulatory Visit: Payer: Self-pay | Admitting: Family

## 2018-12-22 NOTE — Telephone Encounter (Signed)
I think January/february follow up is fine please.

## 2018-12-22 NOTE — Telephone Encounter (Signed)
Brandy Dunlap -- 90 day supply of dexilant was sent to pharmacy. Pt last seen for acute visit 09/02/18 and has no future appts scheduled. When is pt due for follow up in the office?

## 2018-12-23 ENCOUNTER — Other Ambulatory Visit: Payer: Self-pay

## 2018-12-23 DIAGNOSIS — Z20822 Contact with and (suspected) exposure to covid-19: Secondary | ICD-10-CM

## 2018-12-23 NOTE — Telephone Encounter (Signed)
Called patient to set up appointment, she does not want to schedule follow up at this time. She said she will call us back to set up.  I told her I noticed she was tested for covid19 today, she reports her grandson was symptomatic and pediatrician asked for the whole family to be tested. She reports not having any symptoms. I asked her to call for virtual visit if she develops any symptoms.

## 2018-12-24 LAB — NOVEL CORONAVIRUS, NAA: SARS-CoV-2, NAA: NOT DETECTED

## 2019-01-01 ENCOUNTER — Encounter: Payer: Self-pay | Admitting: Family

## 2019-01-02 MED ORDER — DICLOFENAC SODIUM 1 % TD GEL: 2.0000 g | Freq: Four times a day (QID) | TRANSDERMAL | 3 refills | Status: DC | PRN

## 2019-01-29 ENCOUNTER — Ambulatory Visit: Payer: Medicare Other | Admitting: Cardiology

## 2019-02-09 ENCOUNTER — Other Ambulatory Visit: Payer: Self-pay

## 2019-02-09 DIAGNOSIS — Z20822 Contact with and (suspected) exposure to covid-19: Secondary | ICD-10-CM

## 2019-02-11 LAB — NOVEL CORONAVIRUS, NAA: SARS-CoV-2, NAA: NOT DETECTED

## 2019-02-24 ENCOUNTER — Ambulatory Visit (INDEPENDENT_AMBULATORY_CARE_PROVIDER_SITE_OTHER): Payer: Medicare Other | Admitting: Cardiology

## 2019-02-24 ENCOUNTER — Encounter: Payer: Self-pay | Admitting: Cardiology

## 2019-02-24 ENCOUNTER — Other Ambulatory Visit: Payer: Self-pay

## 2019-02-24 VITALS — BP 130/74 | HR 99 | Ht 63.0 in | Wt 202.8 lb

## 2019-02-24 DIAGNOSIS — R7303 Prediabetes: Secondary | ICD-10-CM

## 2019-02-24 DIAGNOSIS — R0789 Other chest pain: Secondary | ICD-10-CM | POA: Diagnosis not present

## 2019-02-24 DIAGNOSIS — I10 Essential (primary) hypertension: Secondary | ICD-10-CM | POA: Diagnosis not present

## 2019-02-24 NOTE — Progress Notes (Signed)
Cardiology Office Note:    Date:  02/24/2019   ID:  Brandy Dunlap, DOB 1950/06/10, MRN [id number]  PCP:  [name], NP  Cardiologist:  Jenne Campus, MD    Referring MD: [name], NP   No chief complaint on file. Doing well  History of Present Illness:    Brandy Dunlap is a 68 y.o. female who was referred to [name] because of hypertension as well as atypical chest pain.  Stress test was done which was negative.  After that she ended up having left hip l replacement surgery.  Surgery was good.  It was complicated by hematoma after that but overall seems to be recovering quite well back doing her exercises classes with some limitations.  Denies having any cardiac complication of surgery.  She described to have some pain in the chest which is located.  It above the left breast related to certain motions but she does when she leaves weights.  Its not related to exercise there is no tightness no squeezing no pressure no burning in the chest.  Again her stress test was negative.  Past Medical History:  Diagnosis Date  . Allergy   . Asthma   . B12 deficiency   . Back pain   . Bronchitis    hx  . Colon polyp 10/06/2003  . GERD (gastroesophageal reflux disease)   . History of kidney stones    per 09-02-2018 pre-op eval , suspected kidney stone , to [facility] CT abd on 7-9- for further eval   . Hypertension   . Low back pain   . Osteoarthritis   . Pre-diabetes   . Sinus trouble     Past Surgical History:  Procedure Laterality Date  . CHOLECYSTECTOMY    . COLONOSCOPY    . COLONOSCOPY W/ BIOPSIES  10/06/2003  . LUMBAR FUSION  2008  . PARTIAL HIP ARTHROPLASTY Right 2009    hip replacement  . TONSILLECTOMY    . TOTAL HIP ARTHROPLASTY Left 09/10/2018   Procedure: TOTAL HIP ARTHROPLASTY ANTERIOR APPROACH;  Surgeon: Gaynelle Arabian, MD;  Location: [facility];  Service: Orthopedics;  Laterality: Left;  . TOTAL KNEE ARTHROPLASTY Right 11/16/2013   Procedure: RIGHT TOTAL KNEE  ARTHROPLASTY;  Surgeon: Vickey Huger, MD;  Location: [place];  Service: Orthopedics;  Laterality: Right;  [name] VIDEO BRONCHOSCOPY Bilateral 12/05/2012   Procedure: VIDEO BRONCHOSCOPY WITHOUT FLUORO;  Surgeon: Collene Gobble, MD;  Location: [facility];  Service: Cardiopulmonary;  Laterality: Bilateral;    Current Medications: Current Meds  Medication Sig  . amLODipine (NORVASC) 5 MG tablet TAKE 1 TABLET BY MOUTH EVERY DAY  . budesonide-formoterol (SYMBICORT) 160-4.5 MCG/ACT inhaler Inhale 2 puffs into the lungs 2 (two) times daily.  . cephALEXin (KEFLEX) 500 MG capsule Take 1 capsule (500 mg total) by mouth 2 (two) times daily.  . Cholecalciferol (VITAMIN D3) 75 MCG (3000 UT) TABS Take 1 tablet by mouth once daily.  [name] DEXILANT 60 MG capsule TAKE 1 CAPSULE BY MOUTH EVERY DAY  . diclofenac sodium (VOLTAREN) 1 % GEL Apply 2 g topically 4 (four) times daily as needed.  [name] HYDROcodone-acetaminophen (NORCO/VICODIN) 5-325 MG tablet Take 1-2 tablets by mouth every 6 (six) hours as needed for severe pain.  [name] loratadine (CLARITIN) 10 MG tablet Take 1 tablet (10 mg total) by mouth daily.  . methocarbamol (ROBAXIN) 500 MG tablet Take 1 tablet (500 mg total) by mouth every 6 (six) hours as needed for muscle spasms.  . montelukast (SINGULAIR) 10 MG  tablet Take 10 mg by mouth at bedtime.  [name] omalizumab (XOLAIR) 150 MG injection Inject 150 mg into the skin every 14 (fourteen) days.  . traMADol (ULTRAM) 50 MG tablet Take 1-2 tablets (50-100 mg total) by mouth every 6 (six) hours as needed for moderate pain.     Allergies:   Pantoprazole sodium   Social History   Socioeconomic History  . Marital status: Single    Spouse name: Not on file  . Number of children: 1  . Years of education: Not on file  . Highest education level: Not on file  Occupational History  . Occupation: Product manager: [employer]  Tobacco Use  . Smoking status: Never Smoker  . Smokeless tobacco: Never Used    Substance and Sexual Activity  . Alcohol use: No  . Drug use: No  . Sexual activity: Yes    Partners: Male    Birth control/protection: Post-menopausal  Other Topics Concern  . Not on file  Social History Narrative   Divorced   One daughter- lives locally and one grandson   Retired Pharmacist, hospital,  Has masters degree   Enjoys reading, spending time with her grandson   Social Determinants of Radio broadcast assistant Strain:   . Difficulty of Paying Living Expenses: Not on file  Food Insecurity:   . Worried About Charity fundraiser in the Last Year: Not on file  . Ran Out of Food in the Last Year: Not on file  Transportation Needs:   . Lack of Transportation (Medical): Not on file  . Lack of Transportation (Non-Medical): Not on file  Physical Activity:   . Days of Exercise per Week: Not on file  . Minutes of Exercise per Session: Not on file  Stress:   . Feeling of Stress : Not on file  Social Connections:   . Frequency of Communication with Friends and Family: Not on file  . Frequency of Social Gatherings with Friends and Family: Not on file  . Attends Religious Services: Not on file  . Active Member of Clubs or Organizations: Not on file  . Attends Archivist Meetings: Not on file  . Marital Status: Not on file     Family History: The patient's family history includes Breast cancer in her maternal aunt; COPD in her father and mother; Cancer in her mother; Emphysema in her father and mother; Heart Problems in her father and mother; Heart disease in her brother and mother; Hyperlipidemia in her mother; Hypertension in her mother; Ovarian cancer in her mother; Stroke in her mother; Thyroid disease in her mother. There is no history of Colon cancer, Esophageal cancer, Rectal cancer, or Stomach cancer. ROS:   Please see the history of present illness.    All 14 point review of systems negative except as described per history of present illness  EKGs/Labs/Other Studies  Reviewed:      Recent Labs: 09/02/2018: ALT 32 09/11/2018: BUN 9; Creatinine, Ser 0.59; Hemoglobin 9.9; Platelets 257; Potassium 3.9; Sodium 138  Recent Lipid Panel    Component Value Date/Time   CHOL 204 (H) 04/04/2017 0838   TRIG 130 04/04/2017 0838   HDL 62 04/04/2017 0838   CHOLHDL 3 10/24/2015 1054   VLDL 21.6 10/24/2015 1054   LDLCALC 116 (H) 04/04/2017 0838   LDLDIRECT 149.9 09/11/2010 0908    Physical Exam:    VS:  BP 130/74   Pulse 99   Ht 5\' 3"  (1.6 m)  Wt 202 lb 12.8 oz (92 kg)   SpO2 99%   BMI 35.92 kg/m     Wt Readings from Last 3 Encounters:  02/24/19 202 lb 12.8 oz (92 kg)  09/10/18 201 lb 8 oz (91.4 kg)  09/04/18 201 lb 8 oz (91.4 kg)     GEN:  Well nourished, well developed in no acute distress HEENT: Normal NECK: No JVD; No carotid bruits LYMPHATICS: No lymphadenopathy CARDIAC: RRR, no murmurs, no rubs, no gallops RESPIRATORY:  Clear to auscultation without rales, wheezing or rhonchi  ABDOMEN: Soft, non-tender, non-distended MUSCULOSKELETAL:  No edema; No deformity  SKIN: Warm and dry LOWER EXTREMITIES: no swelling NEUROLOGIC:  Alert and oriented x 3 PSYCHIATRIC:  Normal affect   ASSESSMENT:    1. Essential hypertension   2. Atypical chest pain   3. Prediabetes    PLAN:    In order of problems listed above:  1. Essential hypertension blood pressure well controlled continue present management. 2. Atypical chest pain not related to her heart. 3. Prediabetes followed by 10 medicine team 4. Dyslipidemia she is scheduled to have fasting lipid profile done in February we will wait for results of it.  Overall cardiac wise doing well see her back in a year   Medication Adjustments/Labs and Tests Ordered: Current medicines are reviewed at length with the patient today.  Concerns regarding medicines are outlined above.  No orders of the defined types were placed in this encounter.  Medication changes: No orders of the defined types were  placed in this encounter.   Signed, Park Liter, MD, [facility] 02/24/2019 11:46 AM    [place]

## 2019-02-24 NOTE — Patient Instructions (Signed)
Medication Instructions:  Your physician recommends that you continue on your current medications as directed. Please refer to the Current Medication list given to you today.  *If you need a refill on your cardiac medications before your next appointment, please call your pharmacy*  Lab Work: None today If you have labs (blood work) drawn today and your tests are completely normal, you will receive your results only by: [name] MyChart Message (if you have MyChart) OR . A paper copy in the mail If you have any lab test that is abnormal or we need to change your treatment, we will call you to review the results.  Testing/Procedures: None today  Follow-Up: At [facility], you and your health needs are our priority.  As part of our continuing mission to provide you with exceptional heart care, we have created designated Provider Care Teams.  These Care Teams include your primary Cardiologist (physician) and Advanced Practice Providers (APPs -  Physician Assistants and Nurse Practitioners) who all work together to provide you with the care you need, when you need it.  Your next appointment:   1 year(s)  The format for your next appointment:   In Person  Provider:   Jenne Campus, MD  Other Instructions None today

## 2019-03-09 DIAGNOSIS — J4540 Moderate persistent asthma, uncomplicated: Secondary | ICD-10-CM | POA: Diagnosis not present

## 2019-03-17 ENCOUNTER — Other Ambulatory Visit: Payer: Self-pay | Admitting: Family

## 2019-03-19 ENCOUNTER — Other Ambulatory Visit: Payer: Self-pay | Admitting: Family

## 2019-03-24 ENCOUNTER — Encounter: Payer: Self-pay | Admitting: Family

## 2019-03-24 NOTE — Telephone Encounter (Signed)
See mychart message from 03/24/19.

## 2019-03-24 NOTE — Telephone Encounter (Signed)
Melissa -- please see pt's my chart messages requesting appt at Respiratory clinic and advise?

## 2019-03-25 ENCOUNTER — Ambulatory Visit: Payer: Medicare Other | Admitting: Family

## 2019-04-01 DIAGNOSIS — J4540 Moderate persistent asthma, uncomplicated: Secondary | ICD-10-CM | POA: Diagnosis not present

## 2019-04-02 ENCOUNTER — Encounter: Payer: Self-pay | Admitting: Family

## 2019-04-02 MED ORDER — MELOXICAM 7.5 MG PO TABS: 7.5000 mg | ORAL_TABLET | Freq: Every day | ORAL | 1 refills | Status: DC | PRN

## 2019-04-15 DIAGNOSIS — J4540 Moderate persistent asthma, uncomplicated: Secondary | ICD-10-CM | POA: Diagnosis not present

## 2019-04-17 ENCOUNTER — Ambulatory Visit: Payer: Medicare PPO | Admitting: Family

## 2019-04-21 ENCOUNTER — Ambulatory Visit: Payer: Medicare PPO | Admitting: Family

## 2019-04-21 ENCOUNTER — Encounter: Payer: Medicare PPO | Admitting: *Deleted

## 2019-04-22 NOTE — Progress Notes (Signed)
Virtual Visit via Audio Note  I connected with patient on 04/23/19 at 10:00 AM EST by audio enabled telemedicine application and verified that I am speaking with the correct person using two identifiers.   THIS ENCOUNTER IS A VIRTUAL VISIT DUE TO COVID-19 - PATIENT WAS NOT SEEN IN THE OFFICE. PATIENT HAS CONSENTED TO VIRTUAL VISIT / TELEMEDICINE VISIT   Location of patient: home  Location of provider: office  I discussed the limitations of evaluation and management by telemedicine and the availability of in person appointments. The patient expressed understanding and agreed to proceed.   Subjective:   DERI DEGUIRE is a 69 y.o. female who presents for Medicare Annual (Subsequent) preventive examination.  Review of Systems:  Home Safety/Smoke Alarms: Feels safe in home. Smoke alarms in place.   Lives with dtr, son-in-law, and grandson. 1 story w/ basement.   Female:   Pap- 10/24/15.      Mammo- ordered       Dexa scan- ordered [facility]- 03/23/16. Recall 10 yrs.     Objective:     Vitals: Unable to assess. This visit is enabled though telemedicine due to Covid 19.   Advanced Directives 04/23/2019 09/10/2018 09/04/2018 03/05/2016 11/16/2013 11/05/2013 01/24/2013  Does Patient Have a Medical Advance Directive? No No No No No No Patient does not have advance directive;Patient would not like information  Would patient like information on creating a medical advance directive? No - Patient declined No - Patient declined No - Patient declined - No - patient declined information - -  Pre-existing out of facility DNR order (yellow form or pink MOST form) - - - - - - No    Tobacco Social History   Tobacco Use  Smoking Status Never Smoker  Smokeless Tobacco Never Used     Counseling given: Not Answered   Clinical Intake: Pain : No/denies pain     Past Medical History:  Diagnosis Date  . Allergy   . Asthma   . B12 deficiency   . Back pain   . Bronchitis    hx  . Colon polyp  10/06/2003  . GERD (gastroesophageal reflux disease)   . History of kidney stones    per 09-02-2018 pre-op eval , suspected kidney stone , to [facility] CT abd on 7-9- for further eval   . Hypertension   . Low back pain   . Osteoarthritis   . Pre-diabetes   . Sinus trouble    Past Surgical History:  Procedure Laterality Date  . CHOLECYSTECTOMY    . COLONOSCOPY    . COLONOSCOPY W/ BIOPSIES  10/06/2003  . LUMBAR FUSION  2008  . PARTIAL HIP ARTHROPLASTY Right 2009    hip replacement  . TONSILLECTOMY    . TOTAL HIP ARTHROPLASTY Left 09/10/2018   Procedure: TOTAL HIP ARTHROPLASTY ANTERIOR APPROACH;  Surgeon: Gaynelle Arabian, MD;  Location: [facility];  Service: Orthopedics;  Laterality: Left;  . TOTAL KNEE ARTHROPLASTY Right 11/16/2013   Procedure: RIGHT TOTAL KNEE ARTHROPLASTY;  Surgeon: Vickey Huger, MD;  Location: [facility];  Service: Orthopedics;  Laterality: Right;  [name] VIDEO BRONCHOSCOPY Bilateral 12/05/2012   Procedure: VIDEO BRONCHOSCOPY WITHOUT FLUORO;  Surgeon: Collene Gobble, MD;  Location: [facility];  Service: Cardiopulmonary;  Laterality: Bilateral;   Family History  Problem Relation Age of Onset  . COPD Father        died at age 46  . Emphysema Father   . Heart Problems Father   . Heart disease Brother  stent with 90%  . COPD Mother   . Heart disease Mother   . Ovarian cancer Mother   . Emphysema Mother   . Hypertension Mother   . Hyperlipidemia Mother   . Heart Problems Mother   . Stroke Mother   . Thyroid disease Mother   . Cancer Mother   . Breast cancer Maternal Aunt   . Colon cancer Neg Hx   . Esophageal cancer Neg Hx   . Rectal cancer Neg Hx   . Stomach cancer Neg Hx    Social History   Socioeconomic History  . Marital status: Single    Spouse name: Not on file  . Number of children: 1  . Years of education: Not on file  . Highest education level: Not on file  Occupational History  . Occupation: Product manager: [employer]  Tobacco  Use  . Smoking status: Never Smoker  . Smokeless tobacco: Never Used  Substance and Sexual Activity  . Alcohol use: No  . Drug use: No  . Sexual activity: Yes    Partners: Male    Birth control/protection: Post-menopausal  Other Topics Concern  . Not on file  Social History Narrative   Divorced   One daughter- lives locally and one grandson   Retired Pharmacist, hospital,  Has masters degree   Enjoys reading, spending time with her grandson   Social Determinants of Radio broadcast assistant Strain: Low Risk   . Difficulty of Paying Living Expenses: Not hard at all  Food Insecurity: No Food Insecurity  . Worried About Charity fundraiser in the Last Year: Never true  . Ran Out of Food in the Last Year: Never true  Transportation Needs: No Transportation Needs  . Lack of Transportation (Medical): No  . Lack of Transportation (Non-Medical): No  Physical Activity:   . Days of Exercise per Week: Not on file  . Minutes of Exercise per Session: Not on file  Stress:   . Feeling of Stress : Not on file  Social Connections:   . Frequency of Communication with Friends and Family: Not on file  . Frequency of Social Gatherings with Friends and Family: Not on file  . Attends Religious Services: Not on file  . Active Member of Clubs or Organizations: Not on file  . Attends Archivist Meetings: Not on file  . Marital Status: Not on file    Outpatient Encounter Medications as of 04/23/2019  Medication Sig  . amLODipine (NORVASC) 5 MG tablet TAKE 1 TABLET BY MOUTH EVERY DAY  . budesonide-formoterol (SYMBICORT) 160-4.5 MCG/ACT inhaler Inhale 2 puffs into the lungs 2 (two) times daily.  . Cholecalciferol (VITAMIN D3) 75 MCG (3000 UT) TABS Take 1 tablet by mouth once daily.  [name] DEXILANT 60 MG capsule TAKE 1 CAPSULE BY MOUTH EVERY DAY  . diclofenac sodium (VOLTAREN) 1 % GEL Apply 2 g topically 4 (four) times daily as needed.  . meloxicam (MOBIC) 7.5 MG tablet Take 1 tablet (7.5 mg total) by  mouth daily as needed for pain.  . montelukast (SINGULAIR) 10 MG tablet Take 10 mg by mouth at bedtime.  [name] omalizumab (XOLAIR) 150 MG injection Inject 150 mg into the skin every 14 (fourteen) days.  . [DISCONTINUED] amLODipine (NORVASC) 5 MG tablet TAKE 1 TABLET BY MOUTH EVERY DAY  . [DISCONTINUED] cephALEXin (KEFLEX) 500 MG capsule Take 1 capsule (500 mg total) by mouth 2 (two) times daily.  . [DISCONTINUED] HYDROcodone-acetaminophen (NORCO/VICODIN)  5-325 MG tablet Take 1-2 tablets by mouth every 6 (six) hours as needed for severe pain.  . [DISCONTINUED] loratadine (CLARITIN) 10 MG tablet Take 1 tablet (10 mg total) by mouth daily.  . [DISCONTINUED] methocarbamol (ROBAXIN) 500 MG tablet Take 1 tablet (500 mg total) by mouth every 6 (six) hours as needed for muscle spasms.  . [DISCONTINUED] traMADol (ULTRAM) 50 MG tablet Take 1-2 tablets (50-100 mg total) by mouth every 6 (six) hours as needed for moderate pain.   No facility-administered encounter medications on file as of 04/23/2019.    Activities of Daily Living In your present state of health, do you have any difficulty performing the following activities: 04/23/2019 09/10/2018  Hearing? Y -  Comment hearing aid right ear. -  Vision? N -  Comment wears readers -  Difficulty concentrating or making decisions? N -  Walking or climbing stairs? N -  Dressing or bathing? N -  Doing errands, shopping? N N  Preparing Food and eating ? N -  Using the Toilet? N -  In the past six months, have you accidently leaked urine? Y -  Do you have problems with loss of bowel control? N -  Managing your Medications? N -  Managing your Finances? N -  Housekeeping or managing your Housekeeping? N -  Some recent data might be hidden    Patient Care Team: [name], NP as PCP - General (Internal Medicine)    Assessment:   This is a routine wellness examination for Isela. Physical assessment deferred to PCP.  Exercise Activities and Dietary  recommendations Current Exercise Habits: Home exercise routine, Type of exercise: calisthenics;yoga;walking, Time (Minutes): 30, Frequency (Times/Week): 4, Weekly Exercise (Minutes/Week): 120, Exercise limited by: None identified Diet (meal preparation, eat out, water intake, caffeinated beverages, dairy products, fruits and vegetables): well balanced      Goals    . Maintain active lifestyle.       Fall Risk Fall Risk  04/23/2019 04/28/2018 10/24/2015 10/25/2014  Falls in the past year? 0 0 No No  Number falls in past yr: 0 - - -  Injury with Fall? 0 0 - -  Follow up Education provided;Falls prevention discussed - - -   Depression Screen PHQ 2/9 Scores 04/23/2019 04/28/2018 04/17/2016 10/24/2015  PHQ - 2 Score 0 0 1 0  PHQ- 9 Score - 0 7 -     Cognitive Function Ad8 score reviewed for issues:  Issues making decisions:no  Less interest in hobbies / activities:no  Repeats questions, stories (family complaining):no  Trouble using ordinary gadgets (microwave, computer, phone):no  Forgets the month or year: no  Mismanaging finances: no  Remembering appts:no Daily problems with thinking and/or memory:no Ad8 score is=0        Immunization History  Administered Date(s) Administered  . Fluad Quad(high Dose 65+) 12/19/2018  . Influenza Split 11/27/2011, 11/26/2012  . PPD Test 11/16/2016  . Pneumococcal Conjugate-13 08/28/2017  . Pneumococcal Polysaccharide-23 10/25/2011  . Td 09/06/2008  . Tdap 11/01/2010  . Zoster 10/03/2011    Screening Tests Health Maintenance  Topic Date Due  . Hepatitis C Screening  Aug 02, 1950  . MAMMOGRAM  12/11/2017  . PNA vac Low Risk Adult (2 of 2 - PPSV23) 08/29/2018  . TETANUS/TDAP  10/31/2020  . COLONOSCOPY  03/23/2026  . INFLUENZA VACCINE  Completed  . DEXA SCAN  Completed      Plan:    Please schedule your next medicare wellness visit with me in 1 yr.  Continue to eat heart healthy diet (full of fruits, vegetables, whole grains, lean  protein, water--limit salt, fat, and sugar intake) and increase physical activity as tolerated.  Continue doing brain stimulating activities (puzzles, reading, adult coloring books, staying active) to keep memory sharp.   I have ordered your bone density and mammogram. Please schedule.   I have personally reviewed and noted the following in the patient's chart:   . Medical and social history . Use of alcohol, tobacco or illicit drugs  . Current medications and supplements . Functional ability and status . Nutritional status . Physical activity . Advanced directives . List of other physicians . Hospitalizations, surgeries, and ER visits in previous 12 months . Vitals . Screenings to include cognitive, depression, and falls . Referrals and appointments  In addition, I have reviewed and discussed with patient certain preventive protocols, quality metrics, and best practice recommendations. A written personalized care plan for preventive services as well as general preventive health recommendations were provided to patient.     Naaman Plummer [place], [place]  04/23/2019

## 2019-04-23 ENCOUNTER — Encounter: Payer: Self-pay | Admitting: *Deleted

## 2019-04-23 ENCOUNTER — Other Ambulatory Visit: Payer: Self-pay

## 2019-04-23 ENCOUNTER — Ambulatory Visit (INDEPENDENT_AMBULATORY_CARE_PROVIDER_SITE_OTHER): Payer: Medicare PPO | Admitting: *Deleted

## 2019-04-23 DIAGNOSIS — Z780 Asymptomatic menopausal state: Secondary | ICD-10-CM

## 2019-04-23 DIAGNOSIS — Z1231 Encounter for screening mammogram for malignant neoplasm of breast: Secondary | ICD-10-CM

## 2019-04-23 DIAGNOSIS — Z0000 Encounter for general adult medical examination without abnormal findings: Secondary | ICD-10-CM

## 2019-04-23 NOTE — Patient Instructions (Signed)
Please schedule your next medicare wellness visit with me in 1 yr.  Continue to eat heart healthy diet (full of fruits, vegetables, whole grains, lean protein, water--limit salt, fat, and sugar intake) and increase physical activity as tolerated.  Continue doing brain stimulating activities (puzzles, reading, adult coloring books, staying active) to keep memory sharp.   I have ordered your bone density and mammogram. Please schedule.    [name] , Thank you for taking time to come for your Medicare Wellness Visit. I appreciate your ongoing commitment to your health goals. Please review the following plan we discussed and let me know if I can assist you in the future.   These are the goals we discussed: Goals    . Maintain active lifestyle.       This is a list of the screening recommended for you and due dates:  Health Maintenance  Topic Date Due  .  Hepatitis C: One time screening is recommended by Center for Disease Control  (CDC) for  adults born from [birth year] through 1965.   01/01/1951  . Mammogram  12/11/2017  . Pneumonia vaccines (2 of 2 - PPSV23) 08/29/2018  . Tetanus Vaccine  10/31/2020  . Colon Cancer Screening  03/23/2026  . Flu Shot  Completed  . DEXA scan (bone density measurement)  Completed    Preventive Care 65 Years and Older, Female Preventive care refers to lifestyle choices and visits with your health care provider that can promote health and wellness. This includes:  A yearly physical exam. This is also called an annual well check.  Regular dental and eye exams.  Immunizations.  Screening for certain conditions.  Healthy lifestyle choices, such as diet and exercise. What can I expect for my preventive care visit? Physical exam Your health care provider will check:  Height and weight. These may be used to calculate body mass index (BMI), which is a measurement that tells if you are at a healthy weight.  Heart rate and blood pressure.  Your skin  for abnormal spots. Counseling Your health care provider may ask you questions about:  Alcohol, tobacco, and drug use.  Emotional well-being.  Home and relationship well-being.  Sexual activity.  Eating habits.  History of falls.  Memory and ability to understand (cognition).  Work and work Statistician.  Pregnancy and menstrual history. What immunizations do I need?  Influenza (flu) vaccine  This is recommended every year. Tetanus, diphtheria, and pertussis (Tdap) vaccine  You may need a Td booster every 10 years. Varicella (chickenpox) vaccine  You may need this vaccine if you have not already been vaccinated. Zoster (shingles) vaccine  You may need this after age 32. Pneumococcal conjugate (PCV13) vaccine  One dose is recommended after age 52. Pneumococcal polysaccharide (PPSV23) vaccine  One dose is recommended after age 1. Measles, mumps, and rubella (MMR) vaccine  You may need at least one dose of MMR if you were born in 1957 or later. You may also need a second dose. Meningococcal conjugate (MenACWY) vaccine  You may need this if you have certain conditions. Hepatitis A vaccine  You may need this if you have certain conditions or if you travel or work in places where you may be exposed to hepatitis A. Hepatitis B vaccine  You may need this if you have certain conditions or if you travel or work in places where you may be exposed to hepatitis B. Haemophilus influenzae type b (Hib) vaccine  You may need this if you have  certain conditions. You may receive vaccines as individual doses or as more than one vaccine together in one shot (combination vaccines). Talk with your health care provider about the risks and benefits of combination vaccines. What tests do I need? Blood tests  Lipid and cholesterol levels. These may be checked every 5 years, or more frequently depending on your overall health.  Hepatitis C test.  Hepatitis B  test. Screening  Lung cancer screening. You may have this screening every year starting at age 4 if you have a 30-pack-year history of smoking and currently smoke or have quit within the past 15 years.  Colorectal cancer screening. All adults should have this screening starting at age 76 and continuing until age 54. Your health care provider may recommend screening at age 50 if you are at increased risk. You will have tests every 1-10 years, depending on your results and the type of screening test.  Diabetes screening. This is done by checking your blood sugar (glucose) after you have not eaten for a while (fasting). You may have this done every 1-3 years.  Mammogram. This may be done every 1-2 years. Talk with your health care provider about how often you should have regular mammograms.  BRCA-related cancer screening. This may be done if you have a family history of breast, ovarian, tubal, or peritoneal cancers. Other tests  Sexually transmitted disease (STD) testing.  Bone density scan. This is done to screen for osteoporosis. You may have this done starting at age 34. Follow these instructions at home: Eating and drinking  Eat a diet that includes fresh fruits and vegetables, whole grains, lean protein, and low-fat dairy products. Limit your intake of foods with high amounts of sugar, saturated fats, and salt.  Take vitamin and mineral supplements as recommended by your health care provider.  Do not drink alcohol if your health care provider tells you not to drink.  If you drink alcohol: ? Limit how much you have to 0-1 drink a day. ? Be aware of how much alcohol is in your drink. In the [place] one drink equals one 12 oz bottle of beer (355 mL), one 5 oz glass of wine (148 mL), or one 1 oz glass of hard liquor (44 mL). Lifestyle  Take daily care of your teeth and gums.  Stay active. Exercise for at least 30 minutes on 5 or more days each week.  Do not use any products that  contain nicotine or tobacco, such as cigarettes, e-cigarettes, and chewing tobacco. If you need help quitting, ask your health care provider.  If you are sexually active, practice safe sex. Use a condom or other form of protection in order to prevent STIs (sexually transmitted infections).  Talk with your health care provider about taking a low-dose aspirin or statin. What's next?  Go to your health care provider once a year for a well check visit.  Ask your health care provider how often you should have your eyes and teeth checked.  Stay up to date on all vaccines. This information is not intended to replace advice given to you by your health care provider. Make sure you discuss any questions you have with your health care provider. Document Revised: 02/06/2018 Document Reviewed: 02/06/2018 Elsevier Patient Education  2020 [employer].

## 2019-04-24 ENCOUNTER — Ambulatory Visit: Payer: Medicare PPO | Admitting: Family

## 2019-04-24 ENCOUNTER — Encounter: Payer: Self-pay | Admitting: Family

## 2019-04-24 ENCOUNTER — Other Ambulatory Visit: Payer: Self-pay

## 2019-04-24 VITALS — BP 122/60 | HR 89 | Temp 97.4°F | Resp 18 | Ht 63.0 in | Wt 205.4 lb

## 2019-04-24 DIAGNOSIS — K219 Gastro-esophageal reflux disease without esophagitis: Secondary | ICD-10-CM

## 2019-04-24 DIAGNOSIS — I10 Essential (primary) hypertension: Secondary | ICD-10-CM

## 2019-04-24 DIAGNOSIS — R350 Frequency of micturition: Secondary | ICD-10-CM | POA: Diagnosis not present

## 2019-04-24 DIAGNOSIS — Z981 Arthrodesis status: Secondary | ICD-10-CM | POA: Diagnosis not present

## 2019-04-24 DIAGNOSIS — G8929 Other chronic pain: Secondary | ICD-10-CM

## 2019-04-24 DIAGNOSIS — M545 Low back pain: Secondary | ICD-10-CM | POA: Diagnosis not present

## 2019-04-24 LAB — POCT URINALYSIS DIP (MANUAL ENTRY)
Bilirubin, UA: NEGATIVE
Blood, UA: NEGATIVE
Glucose, UA: NEGATIVE mg/dL
Ketones, POC UA: NEGATIVE mg/dL
Leukocytes, UA: NEGATIVE
Nitrite, UA: NEGATIVE
Protein Ur, POC: NEGATIVE mg/dL
Spec Grav, UA: 1.015 (ref 1.010–1.025)
Urobilinogen, UA: 2 E.U./dL — AB
pH, UA: 6.5 (ref 5.0–8.0)

## 2019-04-24 LAB — BASIC METABOLIC PANEL
BUN: 11 mg/dL (ref 7–25)
CO2: 22 mmol/L (ref 20–32)
Calcium: 9.2 mg/dL (ref 8.6–10.4)
Chloride: 106 mmol/L (ref 98–110)
Creat: 0.73 mg/dL (ref 0.50–0.99)
Glucose, Bld: 85 mg/dL (ref 65–99)
Potassium: 4.1 mmol/L (ref 3.5–5.3)
Sodium: 139 mmol/L (ref 135–146)

## 2019-04-24 MED ORDER — CEPHALEXIN 500 MG PO CAPS: 500.0000 mg | ORAL_CAPSULE | Freq: Two times a day (BID) | ORAL | 0 refills | Status: DC

## 2019-04-24 NOTE — Patient Instructions (Addendum)
Continue amlodipine. Start Keflex for possible UTI. Call if symptoms worsen or if symptoms fail to improve.

## 2019-04-24 NOTE — Progress Notes (Signed)
Subjective:    Patient ID: Brandy Dunlap, female    DOB: 01-11-1951, 69 y.o.   MRN: [id number]  HPI   Patient is a 69 yr old female who presents today for follow-up.  HTN- maintained on amlodipine 5mg  once daily.  Denies LE edema.  BP Readings from Last 3 Encounters:  04/24/19 122/60  02/24/19 130/74  09/11/18 130/67   GERD- maintained on dexilant. Reports well controlled.  Reports some low back pain/soreness. Worse in the AM and when sleeping. Improves when she gets up and moving.   Had hip replacement   Review of Systems See HPI  Past Medical History:  Diagnosis Date  . Allergy   . Asthma   . B12 deficiency   . Back pain   . Bronchitis    hx  . Colon polyp 10/06/2003  . GERD (gastroesophageal reflux disease)   . History of kidney stones    per 09-02-2018 pre-op eval , suspected kidney stone , to [facility] CT abd on 7-9- for further eval   . Hypertension   . Low back pain   . Osteoarthritis   . Pre-diabetes   . Sinus trouble      Social History   Socioeconomic History  . Marital status: Single    Spouse name: Not on file  . Number of children: 1  . Years of education: Not on file  . Highest education level: Not on file  Occupational History  . Occupation: Product manager: [employer]  Tobacco Use  . Smoking status: Never Smoker  . Smokeless tobacco: Never Used  Substance and Sexual Activity  . Alcohol use: No  . Drug use: No  . Sexual activity: Yes    Partners: Male    Birth control/protection: Post-menopausal  Other Topics Concern  . Not on file  Social History Narrative   Divorced   One daughter- lives locally and one grandson   Retired Pharmacist, hospital,  Has masters degree   Enjoys reading, spending time with her grandson   Social Determinants of Radio broadcast assistant Strain: Low Risk   . Difficulty of Paying Living Expenses: Not hard at all  Food Insecurity: No Food Insecurity  . Worried About Charity fundraiser in the Last  Year: Never true  . Ran Out of Food in the Last Year: Never true  Transportation Needs: No Transportation Needs  . Lack of Transportation (Medical): No  . Lack of Transportation (Non-Medical): No  Physical Activity:   . Days of Exercise per Week: Not on file  . Minutes of Exercise per Session: Not on file  Stress:   . Feeling of Stress : Not on file  Social Connections:   . Frequency of Communication with Friends and Family: Not on file  . Frequency of Social Gatherings with Friends and Family: Not on file  . Attends Religious Services: Not on file  . Active Member of Clubs or Organizations: Not on file  . Attends Archivist Meetings: Not on file  . Marital Status: Not on file  Intimate Partner Violence:   . Fear of Current or Ex-Partner: Not on file  . Emotionally Abused: Not on file  . Physically Abused: Not on file  . Sexually Abused: Not on file    Past Surgical History:  Procedure Laterality Date  . CHOLECYSTECTOMY    . COLONOSCOPY    . COLONOSCOPY W/ BIOPSIES  10/06/2003  . LUMBAR FUSION  2008  .  PARTIAL HIP ARTHROPLASTY Right 2009    hip replacement  . TONSILLECTOMY    . TOTAL HIP ARTHROPLASTY Left 09/10/2018   Procedure: TOTAL HIP ARTHROPLASTY ANTERIOR APPROACH;  Surgeon: Gaynelle Arabian, MD;  Location: [facility];  Service: Orthopedics;  Laterality: Left;  . TOTAL KNEE ARTHROPLASTY Right 11/16/2013   Procedure: RIGHT TOTAL KNEE ARTHROPLASTY;  Surgeon: Vickey Huger, MD;  Location: [place];  Service: Orthopedics;  Laterality: Right;  [name] VIDEO BRONCHOSCOPY Bilateral 12/05/2012   Procedure: VIDEO BRONCHOSCOPY WITHOUT FLUORO;  Surgeon: Collene Gobble, MD;  Location: [facility];  Service: Cardiopulmonary;  Laterality: Bilateral;    Family History  Problem Relation Age of Onset  . COPD Father        died at age 71  . Emphysema Father   . Heart Problems Father   . Heart disease Brother        stent with 90%  . COPD Mother   . Heart disease Mother   . Ovarian cancer  Mother   . Emphysema Mother   . Hypertension Mother   . Hyperlipidemia Mother   . Heart Problems Mother   . Stroke Mother   . Thyroid disease Mother   . Cancer Mother   . Breast cancer Maternal Aunt   . Colon cancer Neg Hx   . Esophageal cancer Neg Hx   . Rectal cancer Neg Hx   . Stomach cancer Neg Hx     Allergies  Allergen Reactions  . Pantoprazole Sodium Palpitations    Current Outpatient Medications on File Prior to Visit  Medication Sig Dispense Refill  . amLODipine (NORVASC) 5 MG tablet TAKE 1 TABLET BY MOUTH EVERY DAY 90 tablet 0  . budesonide-formoterol (SYMBICORT) 160-4.5 MCG/ACT inhaler Inhale 2 puffs into the lungs 2 (two) times daily.    . Cholecalciferol (VITAMIN D3) 75 MCG (3000 UT) TABS Take 1 tablet by mouth once daily. 90 tablet 1  . DEXILANT 60 MG capsule TAKE 1 CAPSULE BY MOUTH EVERY DAY 90 capsule 0  . diclofenac sodium (VOLTAREN) 1 % GEL Apply 2 g topically 4 (four) times daily as needed. 100 g 3  . meloxicam (MOBIC) 7.5 MG tablet Take 1 tablet (7.5 mg total) by mouth daily as needed for pain. 30 tablet 1  . montelukast (SINGULAIR) 10 MG tablet Take 10 mg by mouth at bedtime.    [name] omalizumab (XOLAIR) 150 MG injection Inject 150 mg into the skin every 14 (fourteen) days.    . [DISCONTINUED] amLODipine (NORVASC) 5 MG tablet TAKE 1 TABLET BY MOUTH EVERY DAY 90 tablet 1   No current facility-administered medications on file prior to visit.    BP 122/60 (BP Location: Left Arm, Patient Position: Sitting, Cuff Size: Normal)   Pulse 89   Temp (!) 97.4 F (36.3 C) (Temporal)   Resp 18   Ht 5\' 3"  (1.6 m)   Wt 205 lb 6.4 oz (93.2 kg)   SpO2 97%   BMI 36.38 kg/m       Objective:   Physical Exam Constitutional:      Appearance: She is well-developed.  Cardiovascular:     Rate and Rhythm: Normal rate and regular rhythm.     Heart sounds: Normal heart sounds. No murmur.  Pulmonary:     Effort: Pulmonary effort is normal. No respiratory distress.      Breath sounds: Normal breath sounds. No wheezing.  Musculoskeletal:     Comments: Bilateral low back tenderness to palpation.  Psychiatric:  Behavior: Behavior normal.        Thought Content: Thought content normal.        Judgment: Judgment normal.           Assessment & Plan:  HTN- bp stable. Continue current meds.  GERD- stable on PPI, continue same.  UTI- hx consistent with UTI, though UA is unremarkable. Will rx with keflex.  Musculoskeletal back pain- Chronic, appears at baseline. She has hx of lumbar fusion and would like to be referred back to her surgeon, Dr. Newman Pies. Referral has been initiated.  GERD- stable on PPI. Continue same.   This visit occurred during the SARS-CoV-2 public health emergency.  Safety protocols were in place, including screening questions prior to the visit, additional usage of staff PPE, and extensive cleaning of exam room while observing appropriate contact time as indicated for disinfecting solutions.

## 2019-04-29 DIAGNOSIS — J4540 Moderate persistent asthma, uncomplicated: Secondary | ICD-10-CM | POA: Diagnosis not present

## 2019-05-01 DIAGNOSIS — M4150 Other secondary scoliosis, site unspecified: Secondary | ICD-10-CM | POA: Insufficient documentation

## 2019-05-01 DIAGNOSIS — M4180 Other forms of scoliosis, site unspecified: Secondary | ICD-10-CM | POA: Insufficient documentation

## 2019-05-01 DIAGNOSIS — Z6835 Body mass index (BMI) 35.0-35.9, adult: Secondary | ICD-10-CM | POA: Insufficient documentation

## 2019-05-01 DIAGNOSIS — I10 Essential (primary) hypertension: Secondary | ICD-10-CM | POA: Insufficient documentation

## 2019-05-01 DIAGNOSIS — M545 Low back pain: Secondary | ICD-10-CM | POA: Diagnosis not present

## 2019-05-01 HISTORY — DX: Body mass index (BMI) 35.0-35.9, adult: Z68.35

## 2019-05-13 DIAGNOSIS — J4540 Moderate persistent asthma, uncomplicated: Secondary | ICD-10-CM | POA: Diagnosis not present

## 2019-05-20 DIAGNOSIS — M545 Low back pain: Secondary | ICD-10-CM | POA: Diagnosis not present

## 2019-05-24 ENCOUNTER — Other Ambulatory Visit: Payer: Self-pay | Admitting: Family

## 2019-05-25 DIAGNOSIS — J4540 Moderate persistent asthma, uncomplicated: Secondary | ICD-10-CM | POA: Diagnosis not present

## 2019-06-04 DIAGNOSIS — M48061 Spinal stenosis, lumbar region without neurogenic claudication: Secondary | ICD-10-CM | POA: Diagnosis not present

## 2019-06-09 ENCOUNTER — Other Ambulatory Visit: Payer: Self-pay | Admitting: Family

## 2019-06-10 ENCOUNTER — Encounter: Payer: Self-pay | Admitting: Family

## 2019-06-10 ENCOUNTER — Other Ambulatory Visit: Payer: Self-pay | Admitting: Family

## 2019-06-10 DIAGNOSIS — J4540 Moderate persistent asthma, uncomplicated: Secondary | ICD-10-CM | POA: Diagnosis not present

## 2019-06-10 MED ORDER — AMLODIPINE BESYLATE 5 MG PO TABS: 5.0000 mg | ORAL_TABLET | Freq: Every day | ORAL | 1 refills | Status: DC

## 2019-06-12 ENCOUNTER — Other Ambulatory Visit: Payer: Self-pay

## 2019-06-12 MED ORDER — MELOXICAM 7.5 MG PO TABS: 7.5000 mg | ORAL_TABLET | Freq: Every day | ORAL | 1 refills | Status: DC | PRN

## 2019-06-25 DIAGNOSIS — J4540 Moderate persistent asthma, uncomplicated: Secondary | ICD-10-CM | POA: Diagnosis not present

## 2019-06-30 DIAGNOSIS — J4540 Moderate persistent asthma, uncomplicated: Secondary | ICD-10-CM | POA: Diagnosis not present

## 2019-06-30 DIAGNOSIS — J3081 Allergic rhinitis due to animal (cat) (dog) hair and dander: Secondary | ICD-10-CM | POA: Diagnosis not present

## 2019-06-30 DIAGNOSIS — J019 Acute sinusitis, unspecified: Secondary | ICD-10-CM | POA: Diagnosis not present

## 2019-06-30 DIAGNOSIS — J3089 Other allergic rhinitis: Secondary | ICD-10-CM | POA: Diagnosis not present

## 2019-07-07 DIAGNOSIS — J4540 Moderate persistent asthma, uncomplicated: Secondary | ICD-10-CM | POA: Diagnosis not present

## 2019-07-08 ENCOUNTER — Ambulatory Visit
Admission: RE | Admit: 2019-07-08 | Discharge: 2019-07-08 | Disposition: A | Discharge status: Home / Self Care | Payer: Medicare PPO | Source: Ambulatory Visit | Attending: Internal Medicine | Admitting: Internal Medicine

## 2019-07-08 ENCOUNTER — Other Ambulatory Visit: Payer: Self-pay

## 2019-07-08 DIAGNOSIS — Z1231 Encounter for screening mammogram for malignant neoplasm of breast: Secondary | ICD-10-CM

## 2019-07-08 DIAGNOSIS — Z780 Asymptomatic menopausal state: Secondary | ICD-10-CM | POA: Diagnosis not present

## 2019-07-20 DIAGNOSIS — J4540 Moderate persistent asthma, uncomplicated: Secondary | ICD-10-CM | POA: Diagnosis not present

## 2019-08-03 DIAGNOSIS — J4540 Moderate persistent asthma, uncomplicated: Secondary | ICD-10-CM | POA: Diagnosis not present

## 2019-08-13 ENCOUNTER — Other Ambulatory Visit: Payer: Self-pay | Admitting: Neurosurgery

## 2019-08-19 DIAGNOSIS — J4540 Moderate persistent asthma, uncomplicated: Secondary | ICD-10-CM | POA: Diagnosis not present

## 2019-08-21 DIAGNOSIS — M7062 Trochanteric bursitis, left hip: Secondary | ICD-10-CM | POA: Diagnosis not present

## 2019-08-21 DIAGNOSIS — Z96642 Presence of left artificial hip joint: Secondary | ICD-10-CM | POA: Diagnosis not present

## 2019-08-26 ENCOUNTER — Other Ambulatory Visit: Payer: Self-pay | Admitting: Neurosurgery

## 2019-09-03 ENCOUNTER — Other Ambulatory Visit: Payer: Self-pay | Admitting: Family

## 2019-09-13 NOTE — Pre-Procedure Instructions (Signed)
[id number] [address] [place] [address] [place] [address] Phone: [phone] Fax: [phone]  [id number] - [place], [place] [address] [place] [address] Phone: [phone] Fax: [phone]    Your procedure is scheduled on Wed., September 16, 2019 from 8:30AM-12:24PM  Report to [facility] Entrance "A" at 6:30AM  Call this number if you have problems the morning of surgery:  [phone]   Remember:  Do not eat or drink after midnight on July 20th  Take these medicines the morning of surgery with A SIP OF WATER: AmLODipine (NORVASC)      Dexlansoprazole (DEXILANT)  Budesonide-formoterol (SYMBICORT) Inhaler-bring with you day of surgery  As of today, STOP taking all Aspirin (unless instructed by your doctor) and Other Aspirin containing products, Vitamins, Fish oils, and Herbal medications. Also stop all NSAIDS i.e. Advil, Ibuprofen, Motrin, Aleve, Anaprox, Naproxen, BC, Goody Powders, and all Supplements. Including: Meloxicam (MOBIC)  No Smoking of any kind, Tobacco, or Alcohol products 24 hours prior to your procedure. If you use a Cpap at night, you may bring all equipment for your overnight stay.   Special instructions:   [facility]- Preparing For Surgery  Before surgery, you can play an important role. Because skin is not sterile, your skin needs to be as free of germs as possible. You can reduce the number of germs on your skin by washing with CHG (chlorahexidine gluconate) Soap before surgery.  CHG is an antiseptic cleaner which kills germs and bonds with the skin to continue killing germs even after washing.   Please do not use if you have an allergy to CHG or antibacterial soaps. If your skin becomes reddened/irritated stop using the CHG.  Do not shave (including legs and underarms) for at least 48 hours prior to first CHG shower. It is OK to  shave your face.  Please follow these instructions carefully.   1. Shower the NIGHT BEFORE SURGERY and the MORNING OF SURGERY with CHG.   2. If you chose to wash your hair, wash your hair first as usual with your normal shampoo.  3. After you shampoo, rinse your hair and body thoroughly to remove the shampoo.  4. Use CHG as you would any other liquid soap. You can apply CHG directly to the skin and wash gently with a scrungie or a clean washcloth.   5. Apply the CHG Soap to your body ONLY FROM THE NECK DOWN.  Do not use on open wounds or open sores. Avoid contact with your eyes, ears, mouth and genitals (private parts). Wash Face and genitals (private parts)  with your normal soap.  6. Wash thoroughly, paying special attention to the area where your surgery will be performed.  7. Thoroughly rinse your body with warm water from the neck down.  8. DO NOT shower/wash with your normal soap after using and rinsing off the CHG Soap.  9. Pat yourself dry with a CLEAN TOWEL.  10. Wear CLEAN PAJAMAS to bed the night before surgery, wear comfortable clothes the morning of surgery  11. Place CLEAN SHEETS on your bed the night of your first shower and DO NOT SLEEP WITH PETS.   Day of Surgery:            Remember to brush your teeth WITH YOUR REGULAR TOOTHPASTE.   Do not wear jewelry, make-up or nail polish.  Do not wear lotions, powders, or  perfumes, or deodorant.  Do not shave 48 hours prior to surgery.    Do not bring valuables to the hospital.  [facility] is not responsible for any belongings or valuables.  Contacts, dentures or bridgework may not be worn into surgery.    For patients admitted to the hospital, discharge time will be determined by your treatment team.  Patients discharged the day of surgery will not be allowed to drive home, and someone age 69 and over needs to stay with them for 24 hours.  Please wear clean clothes to the hospital/surgery center.    Please read  over the following fact sheets that you were given.

## 2019-09-14 ENCOUNTER — Other Ambulatory Visit: Payer: Self-pay

## 2019-09-14 ENCOUNTER — Encounter (HOSPITAL_COMMUNITY): Payer: Self-pay

## 2019-09-14 ENCOUNTER — Other Ambulatory Visit (HOSPITAL_COMMUNITY)
Admission: RE | Admit: 2019-09-14 | Discharge: 2019-09-14 | Disposition: A | Discharge status: Home / Self Care | Payer: Medicare PPO | Source: Ambulatory Visit | Attending: Neurosurgery | Admitting: Neurosurgery

## 2019-09-14 ENCOUNTER — Encounter (HOSPITAL_COMMUNITY)
Admission: RE | Admit: 2019-09-14 | Discharge: 2019-09-14 | Disposition: A | Discharge status: Home / Self Care | Payer: Medicare PPO | Source: Ambulatory Visit | Attending: Neurosurgery | Admitting: Neurosurgery

## 2019-09-14 DIAGNOSIS — Z01818 Encounter for other preprocedural examination: Secondary | ICD-10-CM | POA: Diagnosis not present

## 2019-09-14 DIAGNOSIS — M48061 Spinal stenosis, lumbar region without neurogenic claudication: Secondary | ICD-10-CM | POA: Diagnosis not present

## 2019-09-14 DIAGNOSIS — Z20822 Contact with and (suspected) exposure to covid-19: Secondary | ICD-10-CM | POA: Diagnosis not present

## 2019-09-14 DIAGNOSIS — J4540 Moderate persistent asthma, uncomplicated: Secondary | ICD-10-CM | POA: Diagnosis not present

## 2019-09-14 HISTORY — DX: Pneumonia, unspecified organism: J18.9

## 2019-09-14 LAB — CBC
HCT: 36.6 % (ref 36.0–46.0)
Hemoglobin: 11.1 g/dL — ABNORMAL LOW (ref 12.0–15.0)
MCH: 23.2 pg — ABNORMAL LOW (ref 26.0–34.0)
MCHC: 30.3 g/dL (ref 30.0–36.0)
MCV: 76.4 fL — ABNORMAL LOW (ref 80.0–100.0)
Platelets: 355 10*3/uL (ref 150–400)
RBC: 4.79 MIL/uL (ref 3.87–5.11)
RDW: 20.6 % — ABNORMAL HIGH (ref 11.5–15.5)
WBC: 7.5 10*3/uL (ref 4.0–10.5)
nRBC: 0 % (ref 0.0–0.2)

## 2019-09-14 LAB — BASIC METABOLIC PANEL
Anion gap: 9 (ref 5–15)
BUN: 10 mg/dL (ref 8–23)
CO2: 24 mmol/L (ref 22–32)
Calcium: 9.2 mg/dL (ref 8.9–10.3)
Chloride: 109 mmol/L (ref 98–111)
Creatinine, Ser: 0.77 mg/dL (ref 0.44–1.00)
GFR calc Af Amer: >60 mL/min (ref >60)
GFR calc non Af Amer: >60 mL/min (ref >60)
Glucose, Bld: 94 mg/dL (ref 70–99)
Potassium: 3.7 mmol/L (ref 3.5–5.1)
Sodium: 142 mmol/L (ref 135–145)

## 2019-09-14 LAB — GLUCOSE, CAPILLARY: Glucose-Capillary: 103 mg/dL — ABNORMAL HIGH (ref 70–99)

## 2019-09-14 LAB — TYPE AND SCREEN
ABO/RH(D): A POS
Antibody Screen: NEGATIVE

## 2019-09-14 LAB — SURGICAL PCR SCREEN
MRSA, PCR: NEGATIVE
Staphylococcus aureus: NEGATIVE

## 2019-09-14 LAB — SARS CORONAVIRUS 2 (TAT 6-24 HRS): SARS Coronavirus 2: NEGATIVE

## 2019-09-14 NOTE — Progress Notes (Addendum)
PCP - Dr. Radford Pax  Cardiologist - Dr.K  Chest x-ray - na  EKG - 09/14/19  Stress Test - thinks 2019  ECHO - thinks 2020  Cardiac Cath - no  Sleep Study - years ago - negative CPAP - no  LABS- CBC, BMP, T/S, PCR  ASA-no  ERAS-no  HA1C-na Fasting Blood Sugar - na Checks Blood Sugar _na___ times a day  Anesthesia- Brandy Dunlap has Asthma she uses Symbicort as prescribed, rarely uses Pro- Air  Pt denies having chest pain, sob, or fever at this time. All instructions explained to the pt, with a verbal understanding of the material. Pt agrees to go over the instructions while at home for a better understanding. Pt also instructed to self quarantine after being tested for COVID-19. The opportunity to ask questions was provided.

## 2019-09-14 NOTE — Pre-Procedure Instructions (Signed)
Your procedure is scheduled on Wed., September 16, 2019  Report to [facility] Entrance "A" at 6:30AM                Your surgery or procedure is scheduled to begin at 8:30 AM   Call this number if you have problems the morning of surgery:  [phone]   Remember:  Do not eat or drink after midnight on July 20th  Take these medicines the morning of surgery with A SIP OF WATER: AmLODipine (NORVASC)      Dexlansoprazole (DEXILANT)  Budesonide-formoterol (SYMBICORT) Inhaler-bring with you day of surgery  As of today, STOP taking all Aspirin (unless instructed by your doctor) and Other Aspirin containing products, Vitamins, Fish oils, and Herbal medications. Also stop all NSAIDS i.e. Advil, Ibuprofen, Motrin, Aleve, Anaprox, Naproxen, BC, Goody Powders, and all Supplements. Including: Meloxicam (MOBIC)  No Smoking of any kind, Tobacco, or Alcohol products 24 hours prior to your procedure. If you use a Cpap at night, you may bring all equipment for your overnight stay.   Special instructions:   [facility]- Preparing For Surgery  Before surgery, you can play an important role. Because skin is not sterile, your skin needs to be as free of germs as possible. You can reduce the number of germs on your skin by washing with CHG (chlorahexidine gluconate) Soap before surgery.  CHG is an antiseptic cleaner which kills germs and bonds with the skin to continue killing germs even after washing.   Please do not use if you have an allergy to CHG or antibacterial soaps. If your skin becomes reddened/irritated stop using the CHG.  Do not shave (including legs and underarms) for at least 48 hours prior to first CHG shower. It is OK to shave your face.  Please follow these instructions carefully.   1. Shower the NIGHT BEFORE SURGERY and the MORNING OF SURGERY with CHG.   2. If you chose to wash your hair, wash your hair first as usual with your normal shampoo.  3. After you shampoo, rinse your  hair and body thoroughly to remove the shampoo.  4. Use CHG as you would any other liquid soap. You can apply CHG directly to the skin and wash gently with a scrungie or a clean washcloth.   5. Apply the CHG Soap to your body ONLY FROM THE NECK DOWN.  Do not use on open wounds or open sores. Avoid contact with your eyes, ears, mouth and genitals (private parts). Wash Face and genitals (private parts)  with your normal soap.  6. Wash thoroughly, paying special attention to the area where your surgery will be performed.  7. Thoroughly rinse your body with warm water from the neck down.  8. DO NOT shower/wash with your normal soap after using and rinsing off the CHG Soap.  9. Pat yourself dry with a CLEAN TOWEL.  10. Wear CLEAN PAJAMAS to bed the night before surgery, wear comfortable clothes the morning of surgery  11. Place CLEAN SHEETS on your bed the night of your first shower and DO NOT SLEEP WITH PETS.   Day of Surgery:            Remember to brush your teeth WITH YOUR REGULAR TOOTHPASTE.   Do not wear jewelry, make-up or nail polish.  Do not wear lotions, powders, or perfumes, or deodorant.  Do not shave 48 hours prior to surgery.    Do not bring valuables to the hospital.  [facility] is not responsible for any belongings or valuables.  Contacts, dentures or bridgework may not be worn into surgery.    For patients admitted to the hospital, discharge time will be determined by your treatment team.  Patients discharged the day of surgery will not be allowed to drive home, and someone age 69 and over needs to stay with them for 24 hours.  Please wear clean clothes to the hospital/surgery center.    Please read over the following fact sheets that you were given.

## 2019-09-16 ENCOUNTER — Ambulatory Visit (HOSPITAL_COMMUNITY): Payer: Medicare PPO | Admitting: Registered Nurse

## 2019-09-16 ENCOUNTER — Encounter (HOSPITAL_COMMUNITY): Payer: Self-pay | Admitting: Neurosurgery

## 2019-09-16 ENCOUNTER — Other Ambulatory Visit: Payer: Self-pay

## 2019-09-16 ENCOUNTER — Encounter (HOSPITAL_COMMUNITY)
Admission: RE | Disposition: A | Discharge status: Home / Self Care | Payer: Self-pay | Source: Home / Self Care | Attending: Neurosurgery

## 2019-09-16 ENCOUNTER — Ambulatory Visit (HOSPITAL_COMMUNITY)
Admission: RE | Admit: 2019-09-16 | Discharge: 2019-09-17 | Disposition: A | Discharge status: Home / Self Care | Payer: Medicare PPO | Attending: Neurosurgery | Admitting: Neurosurgery

## 2019-09-16 ENCOUNTER — Ambulatory Visit (HOSPITAL_COMMUNITY): Payer: Medicare PPO

## 2019-09-16 DIAGNOSIS — M5116 Intervertebral disc disorders with radiculopathy, lumbar region: Secondary | ICD-10-CM | POA: Diagnosis not present

## 2019-09-16 DIAGNOSIS — M48061 Spinal stenosis, lumbar region without neurogenic claudication: Secondary | ICD-10-CM | POA: Diagnosis present

## 2019-09-16 DIAGNOSIS — K219 Gastro-esophageal reflux disease without esophagitis: Secondary | ICD-10-CM | POA: Insufficient documentation

## 2019-09-16 DIAGNOSIS — M1990 Unspecified osteoarthritis, unspecified site: Secondary | ICD-10-CM | POA: Insufficient documentation

## 2019-09-16 DIAGNOSIS — Z7951 Long term (current) use of inhaled steroids: Secondary | ICD-10-CM | POA: Diagnosis not present

## 2019-09-16 DIAGNOSIS — Z8249 Family history of ischemic heart disease and other diseases of the circulatory system: Secondary | ICD-10-CM | POA: Insufficient documentation

## 2019-09-16 DIAGNOSIS — Z419 Encounter for procedure for purposes other than remedying health state, unspecified: Secondary | ICD-10-CM

## 2019-09-16 DIAGNOSIS — M4326 Fusion of spine, lumbar region: Secondary | ICD-10-CM | POA: Diagnosis not present

## 2019-09-16 DIAGNOSIS — Z79899 Other long term (current) drug therapy: Secondary | ICD-10-CM | POA: Diagnosis not present

## 2019-09-16 DIAGNOSIS — J45909 Unspecified asthma, uncomplicated: Secondary | ICD-10-CM | POA: Diagnosis not present

## 2019-09-16 DIAGNOSIS — I10 Essential (primary) hypertension: Secondary | ICD-10-CM | POA: Diagnosis not present

## 2019-09-16 DIAGNOSIS — M48062 Spinal stenosis, lumbar region with neurogenic claudication: Secondary | ICD-10-CM | POA: Insufficient documentation

## 2019-09-16 DIAGNOSIS — Z791 Long term (current) use of non-steroidal anti-inflammatories (NSAID): Secondary | ICD-10-CM | POA: Diagnosis not present

## 2019-09-16 DIAGNOSIS — Z981 Arthrodesis status: Secondary | ICD-10-CM | POA: Diagnosis not present

## 2019-09-16 DIAGNOSIS — Z888 Allergy status to other drugs, medicaments and biological substances status: Secondary | ICD-10-CM | POA: Insufficient documentation

## 2019-09-16 SURGERY — POSTERIOR LUMBAR FUSION 1 LEVEL
Anesthesia: General | Site: Spine Lumbar

## 2019-09-16 MED ORDER — MORPHINE SULFATE (PF) 4 MG/ML IV SOLN: 4.0000 mg | INTRAVENOUS | Status: DC | PRN

## 2019-09-16 MED ORDER — MOMETASONE FURO-FORMOTEROL FUM 200-5 MCG/ACT IN AERO
2.0000 | INHALATION_SPRAY | Freq: Two times a day (BID) | RESPIRATORY_TRACT | Status: DC
  Filled 2019-09-16: qty 8.8

## 2019-09-16 MED ORDER — ONDANSETRON HCL 4 MG/2ML IJ SOLN
INTRAMUSCULAR | Status: DC | PRN
  Administered 2019-09-16: 4 mg via INTRAVENOUS

## 2019-09-16 MED ORDER — ROCURONIUM BROMIDE 10 MG/ML (PF) SYRINGE
PREFILLED_SYRINGE | INTRAVENOUS | Status: DC | PRN
  Administered 2019-09-16: 50 mg via INTRAVENOUS
  Administered 2019-09-16: 20 mg via INTRAVENOUS

## 2019-09-16 MED ORDER — ZOLPIDEM TARTRATE 5 MG PO TABS: 5.0000 mg | ORAL_TABLET | Freq: Every evening | ORAL | Status: DC | PRN

## 2019-09-16 MED ORDER — FENTANYL CITRATE (PF) 250 MCG/5ML IJ SOLN
INTRAMUSCULAR | Status: AC
  Filled 2019-09-16: qty 5

## 2019-09-16 MED ORDER — BACITRACIN ZINC 500 UNIT/GM EX OINT
TOPICAL_OINTMENT | CUTANEOUS | Status: DC | PRN
  Administered 2019-09-16: 1 via TOPICAL

## 2019-09-16 MED ORDER — CYCLOBENZAPRINE HCL 10 MG PO TABS
10.0000 mg | ORAL_TABLET | Freq: Three times a day (TID) | ORAL | Status: DC | PRN
  Administered 2019-09-16 – 2019-09-17 (×3): 10 mg via ORAL
  Filled 2019-09-16 (×2): qty 1

## 2019-09-16 MED ORDER — 0.9 % SODIUM CHLORIDE (POUR BTL) OPTIME
TOPICAL | Status: DC | PRN
  Administered 2019-09-16: 1000 mL

## 2019-09-16 MED ORDER — BUPIVACAINE-EPINEPHRINE (PF) 0.5% -1:200000 IJ SOLN
INTRAMUSCULAR | Status: DC | PRN
  Administered 2019-09-16: 10 mL

## 2019-09-16 MED ORDER — SODIUM CHLORIDE 0.9 % IV SOLN
250.0000 mL | INTRAVENOUS | Status: DC
  Administered 2019-09-16: 250 mL via INTRAVENOUS

## 2019-09-16 MED ORDER — SODIUM CHLORIDE 0.9% FLUSH: 3.0000 mL | INTRAVENOUS | Status: DC | PRN

## 2019-09-16 MED ORDER — MEPERIDINE HCL 25 MG/ML IJ SOLN: 6.2500 mg | INTRAMUSCULAR | Status: DC | PRN

## 2019-09-16 MED ORDER — SUGAMMADEX SODIUM 200 MG/2ML IV SOLN
INTRAVENOUS | Status: DC | PRN
  Administered 2019-09-16 (×2): 100 mg via INTRAVENOUS

## 2019-09-16 MED ORDER — BACITRACIN ZINC 500 UNIT/GM EX OINT
TOPICAL_OINTMENT | CUTANEOUS | Status: AC
  Filled 2019-09-16: qty 28.35

## 2019-09-16 MED ORDER — AMLODIPINE BESYLATE 5 MG PO TABS
5.0000 mg | ORAL_TABLET | Freq: Every day | ORAL | Status: DC
  Administered 2019-09-16: 5 mg via ORAL
  Filled 2019-09-16 (×2): qty 1

## 2019-09-16 MED ORDER — PROPOFOL 10 MG/ML IV BOLUS
INTRAVENOUS | Status: DC | PRN
  Administered 2019-09-16: 100 mg via INTRAVENOUS

## 2019-09-16 MED ORDER — ONDANSETRON HCL 4 MG/2ML IJ SOLN
INTRAMUSCULAR | Status: AC
  Filled 2019-09-16: qty 2

## 2019-09-16 MED ORDER — FENTANYL CITRATE (PF) 250 MCG/5ML IJ SOLN
INTRAMUSCULAR | Status: DC | PRN
  Administered 2019-09-16 (×2): 50 ug via INTRAVENOUS
  Administered 2019-09-16: 100 ug via INTRAVENOUS
  Administered 2019-09-16 (×2): 50 ug via INTRAVENOUS

## 2019-09-16 MED ORDER — MONTELUKAST SODIUM 10 MG PO TABS
10.0000 mg | ORAL_TABLET | Freq: Every day | ORAL | Status: DC
  Administered 2019-09-16: 10 mg via ORAL
  Filled 2019-09-16 (×2): qty 1

## 2019-09-16 MED ORDER — ORAL CARE MOUTH RINSE: 15.0000 mL | Freq: Once | OROMUCOSAL | Status: AC

## 2019-09-16 MED ORDER — DOCUSATE SODIUM 100 MG PO CAPS
100.0000 mg | ORAL_CAPSULE | Freq: Two times a day (BID) | ORAL | Status: DC
  Administered 2019-09-16 – 2019-09-17 (×2): 100 mg via ORAL
  Filled 2019-09-16 (×2): qty 1

## 2019-09-16 MED ORDER — CHLORHEXIDINE GLUCONATE CLOTH 2 % EX PADS: 6.0000 | MEDICATED_PAD | Freq: Once | CUTANEOUS | Status: DC

## 2019-09-16 MED ORDER — OXYCODONE HCL 5 MG PO TABS
10.0000 mg | ORAL_TABLET | ORAL | Status: DC | PRN
  Administered 2019-09-16 – 2019-09-17 (×4): 10 mg via ORAL
  Filled 2019-09-16 (×4): qty 2

## 2019-09-16 MED ORDER — ACETAMINOPHEN 650 MG RE SUPP: 650.0000 mg | RECTAL | Status: DC | PRN

## 2019-09-16 MED ORDER — HYDROMORPHONE HCL 1 MG/ML IJ SOLN
0.2500 mg | INTRAMUSCULAR | Status: DC | PRN
  Administered 2019-09-16 (×2): 0.5 mg via INTRAVENOUS

## 2019-09-16 MED ORDER — LACTATED RINGERS IV SOLN: INTRAVENOUS | Status: DC | PRN

## 2019-09-16 MED ORDER — OXYCODONE HCL 5 MG PO TABS
5.0000 mg | ORAL_TABLET | ORAL | Status: DC | PRN
  Administered 2019-09-16: 5 mg via ORAL

## 2019-09-16 MED ORDER — ALBUTEROL SULFATE (2.5 MG/3ML) 0.083% IN NEBU: 3.0000 mL | INHALATION_SOLUTION | Freq: Four times a day (QID) | RESPIRATORY_TRACT | Status: DC | PRN

## 2019-09-16 MED ORDER — ONDANSETRON HCL 4 MG/2ML IJ SOLN: 4.0000 mg | Freq: Once | INTRAMUSCULAR | Status: DC | PRN

## 2019-09-16 MED ORDER — PHENOL 1.4 % MT LIQD: 1.0000 | OROMUCOSAL | Status: DC | PRN

## 2019-09-16 MED ORDER — DEXAMETHASONE SODIUM PHOSPHATE 10 MG/ML IJ SOLN
INTRAMUSCULAR | Status: DC | PRN
  Administered 2019-09-16: 10 mg via INTRAVENOUS

## 2019-09-16 MED ORDER — CHLORHEXIDINE GLUCONATE 0.12 % MT SOLN
OROMUCOSAL | Status: AC
  Administered 2019-09-16: 15 mL via OROMUCOSAL
  Filled 2019-09-16: qty 15

## 2019-09-16 MED ORDER — THROMBIN 5000 UNITS EX SOLR
CUTANEOUS | Status: AC
  Filled 2019-09-16: qty 5000

## 2019-09-16 MED ORDER — SODIUM CHLORIDE 0.9 % IV SOLN
INTRAVENOUS | Status: DC | PRN
  Administered 2019-09-16: 500 mL

## 2019-09-16 MED ORDER — LIDOCAINE 2% (20 MG/ML) 5 ML SYRINGE
INTRAMUSCULAR | Status: DC | PRN
  Administered 2019-09-16: 100 mg via INTRAVENOUS

## 2019-09-16 MED ORDER — HYDROMORPHONE HCL 1 MG/ML IJ SOLN
INTRAMUSCULAR | Status: AC
  Filled 2019-09-16: qty 1

## 2019-09-16 MED ORDER — CHLORHEXIDINE GLUCONATE 0.12 % MT SOLN: 15.0000 mL | Freq: Once | OROMUCOSAL | Status: AC

## 2019-09-16 MED ORDER — DEXAMETHASONE SODIUM PHOSPHATE 10 MG/ML IJ SOLN
INTRAMUSCULAR | Status: AC
  Filled 2019-09-16: qty 1

## 2019-09-16 MED ORDER — CEFAZOLIN SODIUM-DEXTROSE 2-4 GM/100ML-% IV SOLN
2.0000 g | INTRAVENOUS | Status: AC
  Administered 2019-09-16: 2 g via INTRAVENOUS
  Filled 2019-09-16: qty 100

## 2019-09-16 MED ORDER — ONDANSETRON HCL 4 MG/2ML IJ SOLN: 4.0000 mg | Freq: Four times a day (QID) | INTRAMUSCULAR | Status: DC | PRN

## 2019-09-16 MED ORDER — LIDOCAINE 2% (20 MG/ML) 5 ML SYRINGE
INTRAMUSCULAR | Status: AC
  Filled 2019-09-16: qty 5

## 2019-09-16 MED ORDER — BUPIVACAINE-EPINEPHRINE 0.5% -1:200000 IJ SOLN
INTRAMUSCULAR | Status: AC
  Filled 2019-09-16: qty 1

## 2019-09-16 MED ORDER — ROCURONIUM BROMIDE 10 MG/ML (PF) SYRINGE
PREFILLED_SYRINGE | INTRAVENOUS | Status: AC
  Filled 2019-09-16: qty 10

## 2019-09-16 MED ORDER — ONDANSETRON HCL 4 MG PO TABS: 4.0000 mg | ORAL_TABLET | Freq: Four times a day (QID) | ORAL | Status: DC | PRN

## 2019-09-16 MED ORDER — OXYCODONE HCL 5 MG PO TABS
ORAL_TABLET | ORAL | Status: AC
  Filled 2019-09-16: qty 1

## 2019-09-16 MED ORDER — BUPIVACAINE LIPOSOME 1.3 % IJ SUSP
20.0000 mL | Freq: Once | INTRAMUSCULAR | Status: DC
  Filled 2019-09-16: qty 20

## 2019-09-16 MED ORDER — CYCLOBENZAPRINE HCL 10 MG PO TABS
ORAL_TABLET | ORAL | Status: AC
  Filled 2019-09-16: qty 1

## 2019-09-16 MED ORDER — ACETAMINOPHEN 500 MG PO TABS
1000.0000 mg | ORAL_TABLET | Freq: Four times a day (QID) | ORAL | Status: DC
  Administered 2019-09-16 – 2019-09-17 (×3): 1000 mg via ORAL
  Filled 2019-09-16 (×3): qty 2

## 2019-09-16 MED ORDER — SODIUM CHLORIDE 0.9% FLUSH
3.0000 mL | Freq: Two times a day (BID) | INTRAVENOUS | Status: DC
  Administered 2019-09-16 (×2): 3 mL via INTRAVENOUS

## 2019-09-16 MED ORDER — MENTHOL 3 MG MT LOZG: 1.0000 | LOZENGE | OROMUCOSAL | Status: DC | PRN

## 2019-09-16 MED ORDER — CEFAZOLIN SODIUM-DEXTROSE 2-4 GM/100ML-% IV SOLN
2.0000 g | Freq: Three times a day (TID) | INTRAVENOUS | Status: AC
  Administered 2019-09-16 (×2): 2 g via INTRAVENOUS
  Filled 2019-09-16 (×2): qty 100

## 2019-09-16 MED ORDER — ACETAMINOPHEN 325 MG PO TABS: 650.0000 mg | ORAL_TABLET | ORAL | Status: DC | PRN

## 2019-09-16 MED ORDER — THROMBIN 5000 UNITS EX SOLR
OROMUCOSAL | Status: DC | PRN
  Administered 2019-09-16: 5 mL via TOPICAL

## 2019-09-16 MED ORDER — PROPOFOL 10 MG/ML IV BOLUS
INTRAVENOUS | Status: AC
  Filled 2019-09-16: qty 20

## 2019-09-16 MED ORDER — LACTATED RINGERS IV SOLN: INTRAVENOUS | Status: DC

## 2019-09-16 MED ORDER — BUPIVACAINE LIPOSOME 1.3 % IJ SUSP
INTRAMUSCULAR | Status: DC | PRN
  Administered 2019-09-16: 20 mL

## 2019-09-16 MED ORDER — BISACODYL 10 MG RE SUPP: 10.0000 mg | Freq: Every day | RECTAL | Status: DC | PRN

## 2019-09-16 SURGICAL SUPPLY — 70 items
APL SKNCLS STERI-STRIP NONHPOA (GAUZE/BANDAGES/DRESSINGS) ×1
BAG DECANTER FOR FLEXI CONT (MISCELLANEOUS) ×2 IMPLANT
BASKET BONE COLLECTION (BASKET) ×1 IMPLANT
BENZOIN TINCTURE PRP APPL 2/3 (GAUZE/BANDAGES/DRESSINGS) ×2 IMPLANT
BLADE CLIPPER SURG (BLADE) IMPLANT
BUR MATCHSTICK NEURO 3.0 LAGG (BURR) ×2 IMPLANT
BUR PRECISION FLUTE 6.0 (BURR) ×2 IMPLANT
CAGE ALTERA 10X31X9-13 15D (Cage) ×1 IMPLANT
CANISTER SUCT 3000ML PPV (MISCELLANEOUS) ×2 IMPLANT
CARTRIDGE OIL MAESTRO DRILL (MISCELLANEOUS) ×1 IMPLANT
CNTNR URN SCR LID CUP LEK RST (MISCELLANEOUS) ×1 IMPLANT
CONT SPEC 4OZ STRL OR WHT (MISCELLANEOUS) ×2
COVER BACK TABLE 60X90IN (DRAPES) ×2 IMPLANT
COVER WAND RF STERILE (DRAPES) ×1 IMPLANT
DECANTER SPIKE VIAL GLASS SM (MISCELLANEOUS) ×2 IMPLANT
DIFFUSER DRILL AIR PNEUMATIC (MISCELLANEOUS) ×2 IMPLANT
DRAPE C-ARM 42X72 X-RAY (DRAPES) ×4 IMPLANT
DRAPE HALF SHEET 40X57 (DRAPES) ×2 IMPLANT
DRAPE LAPAROTOMY 100X72X124 (DRAPES) ×2 IMPLANT
DRAPE SURG 17X23 STRL (DRAPES) ×8 IMPLANT
DRSG OPSITE POSTOP 4X6 (GAUZE/BANDAGES/DRESSINGS) ×1 IMPLANT
ELECT BLADE 4.0 EZ CLEAN MEGAD (MISCELLANEOUS) ×2
ELECT REM PT RETURN 9FT ADLT (ELECTROSURGICAL) ×2
ELECTRODE BLDE 4.0 EZ CLN MEGD (MISCELLANEOUS) ×1 IMPLANT
ELECTRODE REM PT RTRN 9FT ADLT (ELECTROSURGICAL) ×1 IMPLANT
EVACUATOR 1/8 PVC DRAIN (DRAIN) IMPLANT
GAUZE 4X4 16PLY RFD (DISPOSABLE) ×1 IMPLANT
GAUZE SPONGE 4X4 12PLY STRL (GAUZE/BANDAGES/DRESSINGS) ×1 IMPLANT
GLOVE BIO SURGEON STRL SZ 6.5 (GLOVE) ×4 IMPLANT
GLOVE BIO SURGEON STRL SZ7 (GLOVE) ×2 IMPLANT
GLOVE BIO SURGEON STRL SZ8 (GLOVE) ×4 IMPLANT
GLOVE BIO SURGEON STRL SZ8.5 (GLOVE) ×4 IMPLANT
GLOVE BIOGEL PI IND STRL 6.5 (GLOVE) IMPLANT
GLOVE BIOGEL PI INDICATOR 6.5 (GLOVE) ×4
GLOVE EXAM NITRILE XL STR (GLOVE) IMPLANT
GOWN STRL REUS W/ TWL LRG LVL3 (GOWN DISPOSABLE) IMPLANT
GOWN STRL REUS W/ TWL XL LVL3 (GOWN DISPOSABLE) ×2 IMPLANT
GOWN STRL REUS W/TWL 2XL LVL3 (GOWN DISPOSABLE) IMPLANT
GOWN STRL REUS W/TWL LRG LVL3 (GOWN DISPOSABLE) ×6
GOWN STRL REUS W/TWL XL LVL3 (GOWN DISPOSABLE) ×4
HEMOSTAT POWDER KIT SURGIFOAM (HEMOSTASIS) ×2 IMPLANT
KIT BASIN OR (CUSTOM PROCEDURE TRAY) ×2 IMPLANT
KIT TURNOVER KIT B (KITS) ×2 IMPLANT
MILL MEDIUM DISP (BLADE) ×1 IMPLANT
NDL HYPO 21X1.5 SAFETY (NEEDLE) IMPLANT
NEEDLE HYPO 21X1.5 SAFETY (NEEDLE) ×2 IMPLANT
NEEDLE HYPO 22GX1.5 SAFETY (NEEDLE) ×2 IMPLANT
NS IRRIG 1000ML POUR BTL (IV SOLUTION) ×2 IMPLANT
OIL CARTRIDGE MAESTRO DRILL (MISCELLANEOUS) ×2
PACK LAMINECTOMY NEURO (CUSTOM PROCEDURE TRAY) ×2 IMPLANT
PAD ARMBOARD 7.5X6 YLW CONV (MISCELLANEOUS) ×8 IMPLANT
PATTIES SURGICAL .5 X1 (DISPOSABLE) IMPLANT
PUTTY DBM 10CC CALC GRAN (Putty) ×1 IMPLANT
ROD CURVED CCM 6X70 (Rod) ×2 IMPLANT
SCREW SET BREAK OFF (Screw) ×4 IMPLANT
SCREW SET SOLERA (Screw) ×4 IMPLANT
SCREW SET SOLERA TI5.5 (Screw) IMPLANT
SCREW SOLERA 7.5X45 (Screw) ×2 IMPLANT
SPONGE LAP 4X18 RFD (DISPOSABLE) IMPLANT
SPONGE NEURO XRAY DETECT 1X3 (DISPOSABLE) IMPLANT
SPONGE SURGIFOAM ABS GEL 100 (HEMOSTASIS) IMPLANT
STRIP CLOSURE SKIN 1/2X4 (GAUZE/BANDAGES/DRESSINGS) ×2 IMPLANT
SUT VIC AB 1 CT1 18XBRD ANBCTR (SUTURE) ×2 IMPLANT
SUT VIC AB 1 CT1 8-18 (SUTURE) ×4
SUT VIC AB 2-0 CP2 18 (SUTURE) ×4 IMPLANT
SYR 20ML LL LF (SYRINGE) ×1 IMPLANT
TOWEL GREEN STERILE (TOWEL DISPOSABLE) ×2 IMPLANT
TOWEL GREEN STERILE FF (TOWEL DISPOSABLE) ×2 IMPLANT
TRAY FOLEY MTR SLVR 16FR STAT (SET/KITS/TRAYS/PACK) ×2 IMPLANT
WATER STERILE IRR 1000ML POUR (IV SOLUTION) ×2 IMPLANT

## 2019-09-16 NOTE — Progress Notes (Signed)
Subjective: I saw the patient in the PACU.  She looked well.  She was in no apparent distress.  Objective: Vital signs in last 24 hours: Temp:  [98.3 F (36.8 C)-98.6 F (37 C)] 98.6 F (37 C) (07/21 1602) Pulse Rate:  [70-90] 90 (07/21 1602) Resp:  [12-20] 16 (07/21 1602) BP: (131-167)/(66-118) 138/78 (07/21 1602) SpO2:  [92 %-99 %] 94 % (07/21 1602) Weight:  [90 kg] 90 kg (07/21 0713) Estimated body mass index is 35.15 kg/m as calculated from the following:   Height as of this encounter: 5\' 3"  (1.6 m).   Weight as of this encounter: 90 kg.   Intake/Output from previous day: No intake/output data recorded. Intake/Output this shift: Total I/O In: 1460 [P.O.:360; I.V.:1000; IV Piggyback:100] Out: 250 [Urine:150; Blood:100]  Physical exam the patient was alert and pleasant.  She was moving her lower extremities well.  Lab Results: Recent Labs    09/14/19 1155  WBC 7.5  HGB 11.1*  HCT 36.6  PLT 355   BMET Recent Labs    09/14/19 1155  NA 142  K 3.7  CL 109  CO2 24  GLUCOSE 94  BUN 10  CREATININE 0.77  CALCIUM 9.2    Studies/Results: DG Lumbar Spine 2-3 Views  Result Date: 09/16/2019 CLINICAL DATA:  Surgical posterior fusion of L3-4. EXAM: LUMBAR SPINE - 2-3 VIEW; DG C-ARM 1-60 MIN Radiation exposure index: 2.83 mGy. COMPARISON:  September 04, 2018. FINDINGS: Two intraoperative fluoroscopic images were obtained the lower lumbar spine. These images demonstrate interval surgical posterior fusion of L3-4 with bilateral intrapedicular screw placement and interbody fusion. Previous fusion of L4-5 is again noted. IMPRESSION: Fluoroscopic guidance provided during surgical posterior fusion of L3-4. Electronically Signed   By: Marijo Conception M.D.   On: 09/16/2019 13:24   DG C-Arm 1-60 Min  Result Date: 09/16/2019 CLINICAL DATA:  Surgical posterior fusion of L3-4. EXAM: LUMBAR SPINE - 2-3 VIEW; DG C-ARM 1-60 MIN Radiation exposure index: 2.83 mGy. COMPARISON:  September 04, 2018.  FINDINGS: Two intraoperative fluoroscopic images were obtained the lower lumbar spine. These images demonstrate interval surgical posterior fusion of L3-4 with bilateral intrapedicular screw placement and interbody fusion. Previous fusion of L4-5 is again noted. IMPRESSION: Fluoroscopic guidance provided during surgical posterior fusion of L3-4. Electronically Signed   By: Marijo Conception M.D.   On: 09/16/2019 13:24    Assessment/Plan: The patient is doing well.  I spoke with her daughter.  LOS: 0 days     Ophelia Charter 09/16/2019, 4:58 PM

## 2019-09-16 NOTE — Progress Notes (Signed)
Orthopedic Tech Progress Note Patient Details:  Brandy Dunlap [facility] Dec 18, 1950 [phone] RN said patient has Brace. Patient ID: Brandy Dunlap, female   DOB: 1951-01-07, 69 y.o.   MRN: [id number]   Brandy Dunlap 09/16/2019, 5:10 PM

## 2019-09-16 NOTE — Transfer of Care (Signed)
Immediate Anesthesia Transfer of Care Note  Patient: Brandy Dunlap  Procedure(s) Performed: POSTERIOR LUMBAR INTERBODY, FUSION LUMBAR THREE- LUMBAR FOUR, POSTERIOR INSTRUMENTATION, EXPLORE FUSION (N/A Spine Lumbar)  Patient Location: PACU  Anesthesia Type:General  Level of Consciousness: awake, alert  and oriented  Airway & Oxygen Therapy: Patient Spontanous Breathing and Patient connected to nasal cannula oxygen  Post-op Assessment: Report given to RN and Post -op Vital signs reviewed and stable  Post vital signs: Reviewed and stable  Last Vitals:  Vitals Value Taken Time  BP 141/70 09/16/19 1200  Temp 36.9 C 09/16/19 1145  Pulse 82 09/16/19 1211  Resp 14 09/16/19 1211  SpO2 95 % 09/16/19 1211  Vitals shown include unvalidated device data.  Last Pain:  Vitals:   09/16/19 1200  PainSc: (P) 9       Patients Stated Pain Goal: 1 (00/37/04 8889)  Complications: No complications documented.

## 2019-09-16 NOTE — Anesthesia Preprocedure Evaluation (Signed)
Anesthesia Evaluation  Patient identified by MRN, date of birth, ID band Patient awake    Reviewed: Allergy & Precautions, NPO status , Patient's Chart, lab work & pertinent test results  Airway Mallampati: I  TM Distance: >3 FB Neck ROM: Full    Dental   Pulmonary    Pulmonary exam normal        Cardiovascular hypertension, Pt. on medications Normal cardiovascular exam     Neuro/Psych Depression    GI/Hepatic GERD  Medicated and Controlled,  Endo/Other    Renal/GU      Musculoskeletal   Abdominal   Peds  Hematology   Anesthesia Other Findings   Reproductive/Obstetrics                             Anesthesia Physical Anesthesia Plan  ASA: II  Anesthesia Plan: General   Post-op Pain Management:    Induction: Intravenous  PONV Risk Score and Plan: 3 and Ondansetron, Midazolam and Dexamethasone  Airway Management Planned: Oral ETT  Additional Equipment:   Intra-op Plan:   Post-operative Plan: Extubation in OR  Informed Consent: I have reviewed the patients History and Physical, chart, labs and discussed the procedure including the risks, benefits and alternatives for the proposed anesthesia with the patient or authorized representative who has indicated his/her understanding and acceptance.       Plan Discussed with: CRNA and Surgeon  Anesthesia Plan Comments:         Anesthesia Quick Evaluation

## 2019-09-16 NOTE — Anesthesia Procedure Notes (Signed)
Procedure Name: Intubation Date/Time: 09/16/2019 8:51 AM Performed by: Trinna Post., CRNA Pre-anesthesia Checklist: Patient identified, Emergency Drugs available, Suction available, Patient being monitored and Timeout performed Patient Re-evaluated:Patient Re-evaluated prior to induction Oxygen Delivery Method: Circle system utilized Preoxygenation: Pre-oxygenation with 100% oxygen Induction Type: IV induction Ventilation: Mask ventilation without difficulty Laryngoscope Size: Mac and 3 Grade View: Grade I Tube type: Oral Tube size: 7.0 mm Number of attempts: 1 Airway Equipment and Method: Stylet Placement Confirmation: ETT inserted through vocal cords under direct vision,  positive ETCO2 and breath sounds checked- equal and bilateral Secured at: 22 cm Tube secured with: Tape Dental Injury: Teeth and Oropharynx as per pre-operative assessment

## 2019-09-16 NOTE — Op Note (Signed)
Brief history: The patient is a 69 year old white female on whom I performed a L4-5 decompression, instrumentation and fusion in 2012.  She has done well until recently when she developed recurrent back and leg pain consistent with neurogenic claudication.  She failed medical management and was worked up with a lumbar MRI and lumbar x-rays which demonstrated L3-4 spinal stenosis.  I discussed the various treatment options with her.  She has decided proceed with surgery.  Preoperative diagnosis: L3-4 spinal stenosis, degenerative disc disease, spinal stenosis compressing both the L3 and the L4 nerve roots; lumbago; lumbar radiculopathy; neurogenic claudication  Postoperative diagnosis: The same  Procedure: Bilateral L3-4 laminotomy/foraminotomies/medial facetectomy to decompress the bilateral L3 and L4 nerve roots(the work required to do this was in addition to the work required to do the posterior lumbar interbody fusion because of the patient's spinal stenosis, facet arthropathy. Etc. requiring a wide decompression of the nerve roots.);  L3-4 transforaminal lumbar interbody fusion with local morselized autograft bone and Zimmer DBM; insertion of interbody prosthesis at L3-4 (globus peek expandable interbody prosthesis); posterior segmental instrumentation from L3 to L5 with Med[employer] titanium pedicle screws and rods; posterior lateral arthrodesis at L3-4 with local morselized autograft bone and Zimmer DBM; exploration of lumbar fusion/removal of lumbar hardware.  Surgeon: Dr. Earle Gell  Asst.: Arnetha Massy, NP  Anesthesia: Gen. endotracheal  Estimated blood loss: 200 cc  Drains: None  Complications: None  Description of procedure: The patient was brought to the operating room by the anesthesia team. General endotracheal anesthesia was induced. The patient was turned to the prone position on the Wilson frame. The patient's lumbosacral region was then prepared with Betadine scrub and Betadine  solution. Sterile drapes were applied.  I then injected the area to be incised with Marcaine with epinephrine solution. I then used the scalpel to make a linear midline incision over the L3-4 and L4-5 interspace. I then used electrocautery to perform a bilateral subperiosteal dissection exposing the spinous process and lamina of L3, L4 and L5, and exposing the old hardware at L4-5.  We then inserted the Verstrac retractor to provide exposure.  We explored the fusion by removing the caps from the old screws and then removing the rods.  The posterior lateral arthrodesis appeared solid.  I began the decompression by using the high speed drill to perform laminotomies at L3-4 bilaterally. We then used the Kerrison punches to widen the laminotomy and removed the ligamentum flavum at L3-4 bilaterally. We used the Kerrison punches to remove the medial facets at L3-4 bilaterally, we removed the left L3-4 facet. We performed wide foraminotomies about the bilateral L3 and L4 nerve roots completing the decompression.  We now turned our attention to the posterior lumbar interbody fusion. I used a scalpel to incise the intervertebral disc at L3-4 bilaterally. I then performed a partial intervertebral discectomy at L3-4 bilaterally using the pituitary forceps. We prepared the vertebral endplates at G8-8 bilaterally for the fusion by removing the soft tissues with the curettes. We then used the trial spacers to pick the appropriate sized interbody prosthesis. We prefilled his prosthesis with a combination of local morselized autograft bone that we obtained during the decompression as well as Zimmer DBM. We inserted the prefilled prosthesis into the interspace at L3-4 from the left, we then turned and expanded the prosthesis. There was a good snug fit of the prosthesis in the interspace. We then filled and the remainder of the intervertebral disc space with local morselized autograft bone and Zimmer  DBM. This completed the  posterior lumbar interbody arthrodesis.  During the decompression and insertion of the prosthesis the assistant protected the thecal sac and nerve roots with the D'Errico retractor.  We now turned attention to the instrumentation. Under fluoroscopic guidance we cannulated the bilateral L3 pedicles with the bone probe. We then removed the bone probe. We then tapped the pedicle with a 6.5 millimeter tap. We then removed the tap. We probed inside the tapped pedicle with a ball probe to rule out cortical breaches. We then inserted a 7.5 x 45 millimeter pedicle screw into the L3 pedicles bilaterally under fluoroscopic guidance. We then palpated along the medial aspect of the pedicles to rule out cortical breaches. There were none. The nerve roots were not injured. We then connected the unilateral pedicle screws from L3-L5 bilaterally with a lordotic rod. We compressed the construct and secured the rod in place with the caps. We then tightened the caps appropriately. This completed the instrumentation from L3-L5 bilaterally.  We now turned our attention to the posterior lateral arthrodesis at L3-4 bilaterally. We used the high-speed drill to decorticate the remainder of the facets, pars, transverse process at L3-4 bilaterally. We then applied a combination of local morselized autograft bone and Zimmer DBM over these decorticated posterior lateral structures. This completed the posterior lateral arthrodesis.  We then obtained hemostasis using bipolar electrocautery. We irrigated the wound out with bacitracin solution. We inspected the thecal sac and nerve roots and noted they were well decompressed. We then removed the retractor.  We injected Exparel . We reapproximated patient's thoracolumbar fascia with interrupted #1 Vicryl suture. We reapproximated patient's subcutaneous tissue with interrupted 2-0 Vicryl suture. The reapproximated patient's skin with Steri-Strips and benzoin. The wound was then coated with  bacitracin ointment. A sterile dressing was applied. The drapes were removed. The patient was subsequently returned to the supine position where they were extubated by the anesthesia team. He was then transported to the post anesthesia care unit in stable condition. All sponge instrument and needle counts were reportedly correct at the end of this case.

## 2019-09-16 NOTE — H&P (Signed)
Subjective: The patient is a 69 year old white female on whom I previously performed an L4-5 decompression, instrumentation and fusion.  She has done well for years but has developed recurrent back and leg pain consistent with neurogenic claudication.  She has failed medical management and was worked up with lumbar x-rays and lumbar MRI which demonstrated spinal stenosis at L3-4.  I discussed the various treatment options with her.  She has decided to proceed with surgery.  Past Medical History:  Diagnosis Date  . Allergy   . Asthma   . B12 deficiency   . Back pain   . Bronchitis    hx  . Colon polyp 10/06/2003  . GERD (gastroesophageal reflux disease)   . History of kidney stones    per 09-02-2018 pre-op eval , suspected kidney stone , to [facility] CT abd on 7-9- for further eval , passive  . Hypertension   . Low back pain   . Osteoarthritis   . Pneumonia   . Pre-diabetes   . Sinus trouble     Past Surgical History:  Procedure Laterality Date  . CHOLECYSTECTOMY    . COLONOSCOPY    . COLONOSCOPY W/ BIOPSIES  10/06/2003  . LUMBAR FUSION  2008  . PARTIAL HIP ARTHROPLASTY Right 2009    hip replacement  . TONSILLECTOMY    . TOTAL HIP ARTHROPLASTY Left 09/10/2018   Procedure: TOTAL HIP ARTHROPLASTY ANTERIOR APPROACH;  Surgeon: Gaynelle Arabian, MD;  Location: [facility];  Service: Orthopedics;  Laterality: Left;  . TOTAL KNEE ARTHROPLASTY Right 11/16/2013   Procedure: RIGHT TOTAL KNEE ARTHROPLASTY;  Surgeon: Vickey Huger, MD;  Location: [place];  Service: Orthopedics;  Laterality: Right;  [name] VIDEO BRONCHOSCOPY Bilateral 12/05/2012   Procedure: VIDEO BRONCHOSCOPY WITHOUT FLUORO;  Surgeon: Collene Gobble, MD;  Location: [facility];  Service: Cardiopulmonary;  Laterality: Bilateral;    Allergies  Allergen Reactions  . Pantoprazole Sodium Palpitations    Social History   Tobacco Use  . Smoking status: Never Smoker  . Smokeless tobacco: Never Used  Substance Use Topics  . Alcohol use: No     Family History  Problem Relation Age of Onset  . COPD Father        died at age 36  . Emphysema Father   . Heart Problems Father   . Heart disease Brother        stent with 90%  . COPD Mother   . Heart disease Mother   . Ovarian cancer Mother   . Emphysema Mother   . Hypertension Mother   . Hyperlipidemia Mother   . Heart Problems Mother   . Stroke Mother   . Thyroid disease Mother   . Cancer Mother   . Breast cancer Maternal Aunt   . Colon cancer Neg Hx   . Esophageal cancer Neg Hx   . Rectal cancer Neg Hx   . Stomach cancer Neg Hx    Prior to Admission medications   Medication Sig Start Date End Date Taking? Authorizing Provider  amLODipine (NORVASC) 5 MG tablet Take 1 tablet (5 mg total) by mouth daily. 06/10/19  Yes [name], NP  budesonide-formoterol (SYMBICORT) 160-4.5 MCG/ACT inhaler Inhale 2 puffs into the lungs 2 (two) times daily.   Yes [provider]  dexlansoprazole (DEXILANT) 60 MG capsule Take 1 capsule (60 mg total) by mouth daily. 09/03/19  Yes [name], NP  diclofenac sodium (VOLTAREN) 1 % GEL Apply 2 g topically 4 (four) times daily as needed. Patient taking differently:  Apply 2 g topically 4 (four) times daily as needed (knee pain).  01/02/19  Yes [name], NP  meloxicam (MOBIC) 7.5 MG tablet Take 1 tablet (7.5 mg total) by mouth daily as needed. for pain Patient taking differently: Take 7.5 mg by mouth daily as needed for pain.  06/12/19  Yes [name], NP  montelukast (SINGULAIR) 10 MG tablet Take 10 mg by mouth at bedtime.   Yes [provider]  omalizumab Arvid Right) 150 MG injection Inject 150 mg into the skin every 14 (fourteen) days.   Yes [provider]  albuterol (VENTOLIN HFA) 108 (90 Base) MCG/ACT inhaler Inhale into the lungs every 6 (six) hours as needed for wheezing or shortness of breath.    [provider]  cephALEXin (KEFLEX) 500 MG capsule Take 1 capsule (500 mg total)  by mouth 2 (two) times daily. Patient not taking: Reported on 09/03/2019 04/24/19   [name], NP  Cholecalciferol (VITAMIN D3) 75 MCG (3000 UT) TABS Take 1 tablet by mouth once daily. Patient not taking: Reported on 09/03/2019 09/05/18   [name], NP  amLODipine (NORVASC) 5 MG tablet TAKE 1 TABLET BY MOUTH EVERY DAY 09/11/18   [name], NP     Review of Systems  Positive ROS: As above  All other systems have been reviewed and were otherwise negative with the exception of those mentioned in the HPI and as above.  Objective: Vital signs in last 24 hours: Temp:  [98.3 F (36.8 C)] 98.3 F (36.8 C) (07/21 0713) Pulse Rate:  [70] 70 (07/21 0713) Resp:  [20] 20 (07/21 0713) BP: (143)/(78) 143/78 (07/21 0713) SpO2:  [99 %] 99 % (07/21 0713) Weight:  [90 kg] 90 kg (07/21 0713) Estimated body mass index is 35.15 kg/m as calculated from the following:   Height as of this encounter: 5\' 3"  (1.6 m).   Weight as of this encounter: 90 kg.   General Appearance: Alert Head: Normocephalic, without obvious abnormality, atraumatic Eyes: PERRL, conjunctiva/corneas clear, EOM's intact,    Ears: Normal  Throat: Normal  Neck: Supple, Back: Her lumbar incision is well-healed. Lungs: Clear to auscultation bilaterally, respirations unlabored Heart: Regular rate and rhythm, no murmur, rub or gallop Abdomen: Soft, non-tender Extremities: Extremities normal, atraumatic, no cyanosis or edema Skin: unremarkable  NEUROLOGIC:   Mental status: alert and oriented,Motor Exam - grossly normal Sensory Exam - grossly normal Reflexes:  Coordination - grossly normal Gait - grossly normal Balance - grossly normal Cranial Nerves: I: smell Not tested  II: visual acuity  OS: Normal  OD: Normal   II: visual fields Full to confrontation  II: pupils Equal, round, reactive to light  III,VII: ptosis None  III,IV,VI: extraocular muscles  Full ROM  V: mastication Normal  V: facial  light touch sensation  Normal  V,VII: corneal reflex  Present  VII: facial muscle function - upper  Normal  VII: facial muscle function - lower Normal  VIII: hearing Not tested  IX: soft palate elevation  Normal  IX,X: gag reflex Present  XI: trapezius strength  5/5  XI: sternocleidomastoid strength 5/5  XI: neck flexion strength  5/5  XII: tongue strength  Normal    Data Review Lab Results  Component Value Date   WBC 7.5 09/14/2019   HGB 11.1 (L) 09/14/2019   HCT 36.6 09/14/2019   MCV 76.4 (L) 09/14/2019   PLT 355 09/14/2019   Lab Results  Component Value Date   NA 142 09/14/2019   K 3.7  09/14/2019   CL 109 09/14/2019   CO2 24 09/14/2019   BUN 10 09/14/2019   CREATININE 0.77 09/14/2019   GLUCOSE 94 09/14/2019   Lab Results  Component Value Date   INR 0.9 09/02/2018    Assessment/Plan: L3-4 spinal stenosis, lumbago, lumbar radiculopathy, neurogenic claudication: I have discussed the situation with the patient.  I reviewed her imaging studies with her and pointed out the abnormalities.  We have discussed the various treatment options including surgery.  I have described the surgical treatment option of an L3-4 decompression, instrumentation and fusion.  I have shown her surgical models.  I have given her a surgical pamphlet.  We have discussed the risk, benefits, alternatives, expected postop course, and likelihood of achieving our goals with surgery.  I have answered all her questions.  She has decided proceed with surgery.   Ophelia Charter 09/16/2019 8:25 AM

## 2019-09-16 NOTE — Anesthesia Postprocedure Evaluation (Signed)
Anesthesia Post Note  Patient: Brandy Dunlap  Procedure(s) Performed: POSTERIOR LUMBAR INTERBODY, FUSION LUMBAR THREE- LUMBAR FOUR, POSTERIOR INSTRUMENTATION, EXPLORE FUSION (N/A Spine Lumbar)     Patient location during evaluation: PACU Anesthesia Type: General Level of consciousness: awake and alert Pain management: pain level controlled Vital Signs Assessment: post-procedure vital signs reviewed and stable Respiratory status: spontaneous breathing, nonlabored ventilation, respiratory function stable and patient connected to nasal cannula oxygen Cardiovascular status: blood pressure returned to baseline and stable Postop Assessment: no apparent nausea or vomiting Anesthetic complications: no   No complications documented.  Last Vitals:  Vitals:   09/16/19 1354 09/16/19 1602  BP: 139/74 138/78  Pulse: 78 90  Resp: 16 16  Temp: 36.9 C 37 C  SpO2: 96% 94%    Last Pain:  Vitals:   09/16/19 1602  TempSrc: Oral  PainSc:                  [name]

## 2019-09-16 NOTE — Evaluation (Signed)
Physical Therapy Evaluation Patient Details Name: Brandy Dunlap MRN: [id number] DOB: [birth year] Today's Date: 09/16/2019   History of Present Illness  Pt is a 69 y/o female s/p L3-4 PLIF. PMH includes HTN, asthma, L THA, and R TKA.   Clinical Impression  Patient is s/p above surgery resulting in the deficits listed below (see PT Problem List). Pt very guarded during gait, however, overall tolerated well. Used IV pole for support. Educated about using cane for increased stability, back precautions and walking program. Patient will benefit from skilled PT to increase their independence and safety with mobility (while adhering to their precautions) to allow discharge to the venue listed below.     Follow Up Recommendations No PT follow up    Equipment Recommendations  None recommended by PT    Recommendations for Other Services       Precautions / Restrictions Precautions Precautions: Back Precaution Booklet Issued: Yes (comment) Precaution Comments: Verbally reviewed back precautions.  Required Braces or Orthoses: Spinal Brace Spinal Brace: Lumbar corset Restrictions Weight Bearing Restrictions: No      Mobility  Bed Mobility Overal bed mobility: Needs Assistance Bed Mobility: Rolling;Sidelying to Sit Rolling: Supervision Sidelying to sit: Supervision       General bed mobility comments: Used bed rail for rolling. Cues for sequencing using log roll technique.   Transfers Overall transfer level: Needs assistance Equipment used: None Transfers: Sit to/from Stand Sit to Stand: Min guard         General transfer comment: Required 2 attempts to stand this session. Min guard for safety and steadying.   Ambulation/Gait Ambulation/Gait assistance: Min guard Gait Distance (Feet): 400 Feet Assistive device: IV Pole Gait Pattern/deviations: Step-through pattern;Decreased stride length Gait velocity: Decreased   General Gait Details: Very slow, guarded gait. Mild  dizziness and required standing rest. Used IV pole for support. Educated about using cane at d/c to help with stability.  Educated about generalized walking program.   Stairs            Wheelchair Mobility    Modified Rankin (Stroke Patients Only)       Balance Overall balance assessment: Needs assistance Sitting-balance support: No upper extremity supported;Feet supported Sitting balance-Leahy Scale: Good     Standing balance support: No upper extremity supported;During functional activity;Single extremity supported Standing balance-Leahy Scale: Fair                               Pertinent Vitals/Pain Pain Assessment: Faces Faces Pain Scale: Hurts even more Pain Location: back Pain Descriptors / Indicators: Operative site guarding Pain Intervention(s): Limited activity within patient's tolerance;Monitored during session;Repositioned    Home Living Family/patient expects to be discharged to:: Private residence Living Arrangements: Children Available Help at Discharge: Family Type of Home: House Home Access: Stairs to enter Entrance Stairs-Rails: None Entrance Stairs-Number of Steps: 1 Home Layout: One level Home Equipment: Environmental consultant - 2 wheels;Cane - single point;Shower seat      Prior Function Level of Independence: Independent               Hand Dominance        Extremity/Trunk Assessment   Upper Extremity Assessment Upper Extremity Assessment: Defer to OT evaluation    Lower Extremity Assessment Lower Extremity Assessment: Generalized weakness    Cervical / Trunk Assessment Cervical / Trunk Assessment: Other exceptions Cervical / Trunk Exceptions: s/p lumbar surgery  Communication   Communication: No difficulties  Cognition  Arousal/Alertness: Awake/alert Behavior During Therapy: WFL for tasks assessed/performed Overall Cognitive Status: Within Functional Limits for tasks assessed                                         General Comments      Exercises     Assessment/Plan    PT Assessment Patient needs continued PT services  PT Problem List Decreased strength;Decreased balance;Decreased mobility;Decreased knowledge of precautions;Pain       PT Treatment Interventions DME instruction;Gait training;Stair training;Functional mobility training;Therapeutic activities;Therapeutic exercise;Balance training;Patient/family education    PT Goals (Current goals can be found in the Care Plan section)  Acute Rehab PT Goals Patient Stated Goal: to go home PT Goal Formulation: With patient Time For Goal Achievement: 09/30/19 Potential to Achieve Goals: Good    Frequency Min 5X/week   Barriers to discharge        Co-evaluation               AM-PAC PT "6 Clicks" Mobility  Outcome Measure Help needed turning from your back to your side while in a flat bed without using bedrails?: None Help needed moving from lying on your back to sitting on the side of a flat bed without using bedrails?: None Help needed moving to and from a bed to a chair (including a wheelchair)?: A Little Help needed standing up from a chair using your arms (e.g., wheelchair or bedside chair)?: A Little Help needed to walk in hospital room?: A Little Help needed climbing 3-5 steps with a railing? : A Lot 6 Click Score: 19    End of Session Equipment Utilized During Treatment: Gait belt;Back brace Activity Tolerance: Patient tolerated treatment well Patient left: in chair;with call bell/phone within reach Nurse Communication: Mobility status PT Visit Diagnosis: Muscle weakness (generalized) (M62.81);Pain Pain - part of body:  (back)    Time: 6440-3474 PT Time Calculation (min) (ACUTE ONLY): 22 min   Charges:   PT Evaluation $PT Eval Low Complexity: 1 Low          Lou Miner, DPT  Acute Rehabilitation Services  Pager: [phone] Office: [phone]   Brandy Dunlap 09/16/2019, 5:56  PM

## 2019-09-17 DIAGNOSIS — Z791 Long term (current) use of non-steroidal anti-inflammatories (NSAID): Secondary | ICD-10-CM | POA: Diagnosis not present

## 2019-09-17 DIAGNOSIS — J45909 Unspecified asthma, uncomplicated: Secondary | ICD-10-CM | POA: Diagnosis not present

## 2019-09-17 DIAGNOSIS — Z79899 Other long term (current) drug therapy: Secondary | ICD-10-CM | POA: Diagnosis not present

## 2019-09-17 DIAGNOSIS — Z7951 Long term (current) use of inhaled steroids: Secondary | ICD-10-CM | POA: Diagnosis not present

## 2019-09-17 DIAGNOSIS — M48062 Spinal stenosis, lumbar region with neurogenic claudication: Secondary | ICD-10-CM | POA: Diagnosis not present

## 2019-09-17 DIAGNOSIS — I10 Essential (primary) hypertension: Secondary | ICD-10-CM | POA: Diagnosis not present

## 2019-09-17 DIAGNOSIS — M1990 Unspecified osteoarthritis, unspecified site: Secondary | ICD-10-CM | POA: Diagnosis not present

## 2019-09-17 DIAGNOSIS — M5116 Intervertebral disc disorders with radiculopathy, lumbar region: Secondary | ICD-10-CM | POA: Diagnosis not present

## 2019-09-17 DIAGNOSIS — K219 Gastro-esophageal reflux disease without esophagitis: Secondary | ICD-10-CM | POA: Diagnosis not present

## 2019-09-17 LAB — BASIC METABOLIC PANEL
Anion gap: 7 (ref 5–15)
BUN: 9 mg/dL (ref 8–23)
CO2: 25 mmol/L (ref 22–32)
Calcium: 8.8 mg/dL — ABNORMAL LOW (ref 8.9–10.3)
Chloride: 106 mmol/L (ref 98–111)
Creatinine, Ser: 0.86 mg/dL (ref 0.44–1.00)
GFR calc Af Amer: >60 mL/min (ref >60)
GFR calc non Af Amer: >60 mL/min (ref >60)
Glucose, Bld: 121 mg/dL — ABNORMAL HIGH (ref 70–99)
Potassium: 4.4 mmol/L (ref 3.5–5.1)
Sodium: 138 mmol/L (ref 135–145)

## 2019-09-17 LAB — CBC
HCT: 30.5 % — ABNORMAL LOW (ref 36.0–46.0)
Hemoglobin: 9 g/dL — ABNORMAL LOW (ref 12.0–15.0)
MCH: 22.4 pg — ABNORMAL LOW (ref 26.0–34.0)
MCHC: 29.5 g/dL — ABNORMAL LOW (ref 30.0–36.0)
MCV: 76.1 fL — ABNORMAL LOW (ref 80.0–100.0)
Platelets: 286 10*3/uL (ref 150–400)
RBC: 4.01 MIL/uL (ref 3.87–5.11)
RDW: 20.2 % — ABNORMAL HIGH (ref 11.5–15.5)
WBC: 10.1 10*3/uL (ref 4.0–10.5)
nRBC: 0 % (ref 0.0–0.2)

## 2019-09-17 MED ORDER — OXYCODONE-ACETAMINOPHEN 5-325 MG PO TABS: 1.0000 | ORAL_TABLET | ORAL | 0 refills | Status: DC | PRN

## 2019-09-17 MED ORDER — DOCUSATE SODIUM 100 MG PO CAPS: 100.0000 mg | ORAL_CAPSULE | Freq: Two times a day (BID) | ORAL | 0 refills | Status: DC

## 2019-09-17 MED ORDER — CYCLOBENZAPRINE HCL 10 MG PO TABS: 10.0000 mg | ORAL_TABLET | Freq: Three times a day (TID) | ORAL | 1 refills | Status: DC | PRN

## 2019-09-17 MED ORDER — OXYCODONE-ACETAMINOPHEN 5-325 MG PO TABS: 1.0000 | ORAL_TABLET | ORAL | Status: DC | PRN

## 2019-09-17 NOTE — Evaluation (Signed)
Occupational Therapy Evaluation Patient Details Name: Brandy Dunlap MRN: [id number] DOB: April 25, 1950 Today's Date: 09/17/2019    History of Present Illness Pt is a 69 y/o female s/p L3-4 PLIF. PMH includes HTN, asthma, L THA, and R TKA.    Clinical Impression   Patient evaluated by Occupational Therapy with no further acute OT needs identified. All education has been completed and the patient has no further questions. Pt is able to perform ADLs with supervision and demonstrates good understanding of back precautions See below for any follow-up Occupational Therapy or equipment needs. OT is signing off. Thank you for this referral.      Follow Up Recommendations  No OT follow up;Supervision - Intermittent    Equipment Recommendations  None recommended by OT    Recommendations for Other Services       Precautions / Restrictions Precautions Precautions: Back Precaution Booklet Issued: Yes (comment) Precaution Comments: Pt able to state 3/3 back precautions  Required Braces or Orthoses: Spinal Brace Spinal Brace: Lumbar corset Restrictions Weight Bearing Restrictions: No      Mobility Bed Mobility Overal bed mobility: Needs Assistance Bed Mobility: Rolling Rolling: Supervision Sidelying to sit: Supervision       General bed mobility comments: Pt has an adjustable bed at home   Transfers Overall transfer level: Needs assistance Equipment used: None Transfers: Sit to/from [employer] Transfers Sit to Stand: Supervision Stand pivot transfers: Min guard            Balance Overall balance assessment: Needs assistance Sitting-balance support: No upper extremity supported;Feet supported Sitting balance-Leahy Scale: Good     Standing balance support: No upper extremity supported Standing balance-Leahy Scale: Fair                             ADL either performed or assessed with clinical judgement   ADL Overall ADL's : Needs  assistance/impaired Eating/Feeding: Independent   Grooming: Wash/dry hands;Wash/dry face;Oral care;Brushing hair;Supervision/safety;Standing Grooming Details (indicate cue type and reason): reviewed appropriate technique  Upper Body Bathing: Set up;Supervision/ safety;Sitting   Lower Body Bathing: Supervison/ safety;Sit to/from stand   Upper Body Dressing : Supervision/safety;Sitting   Lower Body Dressing: Supervision/safety;Sit to/from stand Lower Body Dressing Details (indicate cue type and reason): able to perform figure 4  Toilet Transfer: Supervision/safety;Ambulation;Comfort height toilet;Grab bars   Toileting- Clothing Manipulation and Hygiene: Supervision/safety;Sit to/from stand   Tub/ Shower Transfer: Tub transfer;Supervision/safety;Ambulation;Shower seat;Grab bars   Functional mobility during ADLs: Supervision/safety       Vision         Perception     Praxis      Pertinent Vitals/Pain Pain Assessment: Faces Pain Score: 5  Pain Location: back Pain Descriptors / Indicators: Operative site guarding;Discomfort;Aching Pain Intervention(s): Monitored during session     Hand Dominance Right   Extremity/Trunk Assessment Upper Extremity Assessment Upper Extremity Assessment: Overall WFL for tasks assessed   Lower Extremity Assessment Lower Extremity Assessment: Defer to PT evaluation   Cervical / Trunk Assessment Cervical / Trunk Assessment: Other exceptions Cervical / Trunk Exceptions: s/p lumbar surgery   Communication Communication Communication: No difficulties   Cognition Arousal/Alertness: Awake/alert Behavior During Therapy: WFL for tasks assessed/performed Overall Cognitive Status: Within Functional Limits for tasks assessed                                     General Comments  reviewed safe options for IADLs and positioning in bed     Exercises     Shoulder Instructions      Home Living Family/patient expects to be  discharged to:: Private residence Living Arrangements: Children Available Help at Discharge: Family Type of Home: House Home Access: Stairs to enter Technical brewer of Steps: 1 Entrance Stairs-Rails: None Home Layout: One level     Bathroom Shower/Tub: Teacher, early years/pre: Handicapped height Bathroom Accessibility: Yes How Accessible: Accessible via walker Home Equipment: [place] - 2 wheels;Cane - single point;Shower seat;Grab bars - tub/shower;Adaptive equipment Adaptive Equipment: Reacher        Prior Functioning/Environment Level of Independence: Independent                 OT Problem List: Pain;Decreased knowledge of precautions      OT Treatment/Interventions:      OT Goals(Current goals can be found in the care plan section) Acute Rehab OT Goals Patient Stated Goal: to go home soon and get back to normal  OT Goal Formulation: All assessment and education complete, DC therapy  OT Frequency:     Barriers to D/C:            Co-evaluation              AM-PAC OT "6 Clicks" Daily Activity     Outcome Measure Help from another person eating meals?: None Help from another person taking care of personal grooming?: A Little Help from another person toileting, which includes using toliet, bedpan, or urinal?: A Little Help from another person bathing (including washing, rinsing, drying)?: A Little Help from another person to put on and taking off regular upper body clothing?: A Little Help from another person to put on and taking off regular lower body clothing?: A Little 6 Click Score: 19   End of Session Equipment Utilized During Treatment: Back brace Nurse Communication: Mobility status  Activity Tolerance: Patient tolerated treatment well Patient left: Other (comment) (with PT )  OT Visit Diagnosis: Pain Pain - part of body:  (back )                Time: 7622-6333 OT Time Calculation (min): 12 min Charges:  OT General  Charges $OT Visit: 1 Visit OT Evaluation $OT Eval Low Complexity: 1 Low  Nilsa Nutting., OTR/L Acute Rehabilitation Services Pager [phone] Office [place], [place] 09/17/2019, 10:07 AM

## 2019-09-17 NOTE — Progress Notes (Signed)
Physical Therapy Treatment Patient Details Name: Brandy Dunlap MRN: [id number] DOB: 09-Dec-1950 Today's Date: 09/17/2019    History of Present Illness Pt is a 69 y/o female s/p L3-4 PLIF. PMH includes HTN, asthma, L THA, and R TKA.     PT Comments    Pt progressing well with post-op mobility. She was able to demonstrate transfers and ambulation with gross supervision for safety and [facility] for support. Pt completed stair training with up to supervision as well. Pt was educated on precautions, brace application/wearing schedule, appropriate activity progression, and car transfer. Will continue to follow.      Follow Up Recommendations  No PT follow up     Equipment Recommendations  None recommended by PT    Recommendations for Other Services       Precautions / Restrictions Precautions Precautions: Back Precaution Booklet Issued: Yes (comment) Precaution Comments: Pt able to state 3/3 back precautions  Required Braces or Orthoses: Spinal Brace Spinal Brace: Lumbar corset Restrictions Weight Bearing Restrictions: No    Mobility  Bed Mobility Overal bed mobility: Needs Assistance Bed Mobility: Rolling Rolling: Supervision Sidelying to sit: Supervision       General bed mobility comments: Pt was received ambulating in hall with OT  Transfers Overall transfer level: Needs assistance Equipment used: Straight cane Transfers: Sit to/from Stand Sit to Stand: Supervision Stand pivot transfers: Min guard       General transfer comment: VC's for wider BOS and for hand placement on seated surface for safety. Pt was able to lower herself down to the chair safely without UE support   Ambulation/Gait Ambulation/Gait assistance: Supervision Gait Distance (Feet): 350 Feet Assistive device: Straight cane Gait Pattern/deviations: Step-through pattern;Decreased stride length Gait velocity: Decreased Gait velocity interpretation: <1.8 ft/sec, indicate of risk for recurrent  falls General Gait Details: Initially without an AD and then with SPC. Pt appeared more comfortable and confident with the [place].    Stairs Stairs: Yes Stairs assistance: Min guard;Supervision Stair Management: One rail Left;Step to pattern;Forwards;With cane Number of Stairs: 10 General stair comments: VC's for sequencing and general safety. No assist required. Progressed to supervision for safety by end of stair training.    Wheelchair Mobility    Modified Rankin (Stroke Patients Only)       Balance Overall balance assessment: Needs assistance Sitting-balance support: No upper extremity supported;Feet supported Sitting balance-Leahy Scale: Good     Standing balance support: No upper extremity supported Standing balance-Leahy Scale: Fair                              Cognition Arousal/Alertness: Awake/alert Behavior During Therapy: WFL for tasks assessed/performed Overall Cognitive Status: Within Functional Limits for tasks assessed                                        Exercises      General Comments General comments (skin integrity, edema, etc.): reviewed safe options for IADLs and positioning in bed       Pertinent Vitals/Pain Pain Assessment: Faces Pain Score: 5  Faces Pain Scale: Hurts even more Pain Location: back Pain Descriptors / Indicators: Operative site guarding;Discomfort;Aching Pain Intervention(s): Limited activity within patient's tolerance;Monitored during session;Repositioned    Home Living Family/patient expects to be discharged to:: Private residence Living Arrangements: Children Available Help at Discharge: Family Type of Home: [place]  Access: Stairs to enter Entrance Stairs-Rails: None Home Layout: One level Home Equipment: Environmental consultant - 2 wheels;Cane - single point;Shower seat;Grab bars - tub/shower;Adaptive equipment      Prior Function Level of Independence: Independent          PT Goals (current  goals can now be found in the care plan section) Acute Rehab PT Goals Patient Stated Goal: to go home soon and get back to normal  PT Goal Formulation: With patient Time For Goal Achievement: 09/30/19 Potential to Achieve Goals: Good Progress towards PT goals: Progressing toward goals    Frequency    Min 5X/week      PT Plan Current plan remains appropriate    Co-evaluation              AM-PAC PT "6 Clicks" Mobility   Outcome Measure  Help needed turning from your back to your side while in a flat bed without using bedrails?: None Help needed moving from lying on your back to sitting on the side of a flat bed without using bedrails?: None Help needed moving to and from a bed to a chair (including a wheelchair)?: None Help needed standing up from a chair using your arms (e.g., wheelchair or bedside chair)?: None Help needed to walk in hospital room?: None Help needed climbing 3-5 steps with a railing? : A Little 6 Click Score: 23    End of Session Equipment Utilized During Treatment: Gait belt;Back brace Activity Tolerance: Patient tolerated treatment well Patient left: in chair;with call bell/phone within reach Nurse Communication: Mobility status PT Visit Diagnosis: Muscle weakness (generalized) (M62.81);Pain Pain - part of body:  (back)     Time: 8099-8338 PT Time Calculation (min) (ACUTE ONLY): 23 min  Charges:  $Gait Training: 23-37 mins                     Rolinda Roan, PT, DPT Acute Rehabilitation Services Pager: [phone] Office: [phone]    Thelma Comp 09/17/2019, 11:11 AM

## 2019-09-17 NOTE — Progress Notes (Signed)
Patient is discharged from room 3C04 at this time. Alert and in stable condition. IV site d/c'd and instructions read to patient and daughter with understanding verbalized and all questions answered. Left unit via wheelchair with all belongings at side. 

## 2019-09-17 NOTE — Discharge Summary (Signed)
Physician Discharge Summary  Patient ID: Brandy Dunlap MRN: [id number] DOB/AGE: 69-Jun-1952 69 y.o.  Admit date: 09/16/2019 Discharge date: 09/17/2019  Admission Diagnoses: Lumbar spinal stenosis with neurogenic claudication, lumbago, lumbar radiculopathy  Discharge Diagnoses: The same Active Problems:   Spinal stenosis of lumbar region with neurogenic claudication   Discharged Condition: good  Hospital Course: I performed an L3-4 decompression, instrumentation and fusion on the patient on 09/16/2019.  The surgery went well.  The patient's postoperative course was unremarkable.  On postoperative day #1 she requested discharge to home.  She was given written and oral discharge instructions.  All her questions were answered.  Consults: PT, OT, care management Significant Diagnostic Studies: None Treatments: Exploration of lumbar fusion, removal of lumbar hardware, L3-4 decompression, instrumentation and fusion. Discharge Exam: Blood pressure 116/86, pulse 69, temperature 98.4 F (36.9 C), temperature source Oral, resp. rate 18, height 5\' 3"  (1.6 m), weight 90 kg, SpO2 93 %. The patient is alert and pleasant.  She looks well.  Her lower extremity strength is normal.  Her dressing is clean and dry.  Disposition: Home  Discharge Instructions    Call MD for:  difficulty breathing, headache or visual disturbances   Complete by: As directed    Call MD for:  extreme fatigue   Complete by: As directed    Call MD for:  hives   Complete by: As directed    Call MD for:  persistant dizziness or light-headedness   Complete by: As directed    Call MD for:  persistant nausea and vomiting   Complete by: As directed    Call MD for:  redness, tenderness, or signs of infection (pain, swelling, redness, odor or green/yellow discharge around incision site)   Complete by: As directed    Call MD for:  severe uncontrolled pain   Complete by: As directed    Call MD for:  temperature >100.4    Complete by: As directed    Diet - low sodium heart healthy   Complete by: As directed    Discharge instructions   Complete by: As directed    Call [phone] for a followup appointment. Take a stool softener while you are using pain medications.   Driving Restrictions   Complete by: As directed    Do not drive for 2 weeks.   Increase activity slowly   Complete by: As directed    Lifting restrictions   Complete by: As directed    Do not lift more than 5 pounds. No excessive bending or twisting.   May shower / Bathe   Complete by: As directed    Remove the dressing for 3 days after surgery.  You may shower, but leave the incision alone.   Remove dressing in 48 hours   Complete by: As directed      Allergies as of 09/17/2019      Reactions   Pantoprazole Sodium Palpitations      Medication List    STOP taking these medications   cephALEXin 500 MG capsule Commonly known as: KEFLEX   meloxicam 7.5 MG tablet Commonly known as: MOBIC     TAKE these medications   albuterol 108 (90 Base) MCG/ACT inhaler Commonly known as: VENTOLIN HFA Inhale into the lungs every 6 (six) hours as needed for wheezing or shortness of breath.   amLODipine 5 MG tablet Commonly known as: NORVASC Take 1 tablet (5 mg total) by mouth daily.   budesonide-formoterol 160-4.5 MCG/ACT inhaler Commonly known as: SYMBICORT  Inhale 2 puffs into the lungs 2 (two) times daily.   cyclobenzaprine 10 MG tablet Commonly known as: FLEXERIL Take 1 tablet (10 mg total) by mouth 3 (three) times daily as needed for muscle spasms.   Dexilant 60 MG capsule Generic drug: dexlansoprazole Take 1 capsule (60 mg total) by mouth daily.   diclofenac sodium 1 % Gel Commonly known as: VOLTAREN Apply 2 g topically 4 (four) times daily as needed. What changed: reasons to take this   docusate sodium 100 MG capsule Commonly known as: COLACE Take 1 capsule (100 mg total) by mouth 2 (two) times daily.   montelukast 10  MG tablet Commonly known as: SINGULAIR Take 10 mg by mouth at bedtime.   oxyCODONE-acetaminophen 5-325 MG tablet Commonly known as: PERCOCET/ROXICET Take 1-2 tablets by mouth every 4 (four) hours as needed for moderate pain.   Vitamin D3 75 MCG (3000 UT) Tabs Take 1 tablet by mouth once daily.   Xolair 150 MG injection Generic drug: omalizumab Inject 150 mg into the skin every 14 (fourteen) days.        Signed: Ophelia Charter 09/17/2019, 8:07 AM

## 2019-09-17 NOTE — Discharge Instructions (Signed)

## 2019-09-22 MED FILL — Heparin Sodium (Porcine) Inj 1000 Unit/ML: INTRAMUSCULAR | Qty: 30 | Status: AC

## 2019-09-22 MED FILL — Sodium Chloride IV Soln 0.9%: INTRAVENOUS | Qty: 1000 | Status: AC

## 2019-10-02 DIAGNOSIS — G8929 Other chronic pain: Secondary | ICD-10-CM | POA: Insufficient documentation

## 2019-10-02 DIAGNOSIS — M1712 Unilateral primary osteoarthritis, left knee: Secondary | ICD-10-CM | POA: Diagnosis not present

## 2019-10-02 DIAGNOSIS — M25562 Pain in left knee: Secondary | ICD-10-CM

## 2019-10-02 HISTORY — DX: Pain in left knee: M25.562

## 2019-10-07 DIAGNOSIS — J4540 Moderate persistent asthma, uncomplicated: Secondary | ICD-10-CM | POA: Diagnosis not present

## 2019-10-16 DIAGNOSIS — M48061 Spinal stenosis, lumbar region without neurogenic claudication: Secondary | ICD-10-CM | POA: Diagnosis not present

## 2019-10-20 DIAGNOSIS — J4540 Moderate persistent asthma, uncomplicated: Secondary | ICD-10-CM | POA: Diagnosis not present

## 2019-11-16 DIAGNOSIS — J4540 Moderate persistent asthma, uncomplicated: Secondary | ICD-10-CM | POA: Diagnosis not present

## 2019-11-30 DIAGNOSIS — D692 Other nonthrombocytopenic purpura: Secondary | ICD-10-CM | POA: Diagnosis not present

## 2019-12-09 DIAGNOSIS — J4540 Moderate persistent asthma, uncomplicated: Secondary | ICD-10-CM | POA: Diagnosis not present

## 2019-12-14 ENCOUNTER — Encounter: Payer: Self-pay | Admitting: Family

## 2019-12-14 ENCOUNTER — Telehealth: Payer: Self-pay

## 2019-12-16 ENCOUNTER — Other Ambulatory Visit: Payer: Self-pay

## 2019-12-16 ENCOUNTER — Ambulatory Visit: Payer: Medicare PPO | Admitting: Medical

## 2019-12-16 VITALS — BP 134/55 | HR 87 | Temp 99.2°F | Resp 18 | Ht 63.0 in | Wt 196.0 lb

## 2019-12-16 DIAGNOSIS — H6122 Impacted cerumen, left ear: Secondary | ICD-10-CM | POA: Diagnosis not present

## 2019-12-16 DIAGNOSIS — R350 Frequency of micturition: Secondary | ICD-10-CM

## 2019-12-16 DIAGNOSIS — H9202 Otalgia, left ear: Secondary | ICD-10-CM | POA: Diagnosis not present

## 2019-12-16 DIAGNOSIS — R300 Dysuria: Secondary | ICD-10-CM

## 2019-12-16 DIAGNOSIS — M549 Dorsalgia, unspecified: Secondary | ICD-10-CM | POA: Diagnosis not present

## 2019-12-16 LAB — POC URINALSYSI DIPSTICK (AUTOMATED)
Bilirubin, UA: NEGATIVE
Blood, UA: NEGATIVE
Glucose, UA: NEGATIVE
Ketones, UA: NEGATIVE
Leukocytes, UA: NEGATIVE
Nitrite, UA: NEGATIVE
Protein, UA: NEGATIVE
Spec Grav, UA: 1.015 (ref 1.010–1.025)
Urobilinogen, UA: 0.2 E.U./dL
pH, UA: 6 (ref 5.0–8.0)

## 2019-12-16 MED ORDER — FLUCONAZOLE 150 MG PO TABS: 150.0000 mg | ORAL_TABLET | Freq: Once | ORAL | 0 refills | Status: AC

## 2019-12-16 MED ORDER — CEFTRIAXONE SODIUM 1 G IJ SOLR
1.0000 g | Freq: Once | INTRAMUSCULAR | Status: AC
  Administered 2019-12-16: 1 g via INTRAMUSCULAR

## 2019-12-16 MED ORDER — AMOXICILLIN-POT CLAVULANATE 875-125 MG PO TABS: 1.0000 | ORAL_TABLET | Freq: Two times a day (BID) | ORAL | 0 refills | Status: DC

## 2019-12-16 NOTE — Patient Instructions (Addendum)
Recent signs and symptoms that favor urinary tract infection.  Costovertebral angle tenderness causes concern for kidney infection.  Urine is clear but patient does report in the past has had infections positive urine culture but initial UA looked negative.  We will give Rocephin 1 g IM.  Prescribe Augmentin antibiotic.  Follow culture and change antibiotic if necessary.  Diflucan Rx made available if any yeast infection occurs.  Recent ear pain with potential ear infection/otitis media.  Wax obscuring view.  Counseled that Augmentin does have good coverage for ear infections.  Follow-up next Tuesday for possible lavage/wax removal.  That is provided tenderness/pain resolved significantly.

## 2019-12-16 NOTE — Progress Notes (Addendum)
Subjective:    Patient ID: Brandy Dunlap, female    DOB: 05-11-1950, 69 y.o.   MRN: [id number]  HPI  Pt in today reporting urinary symptoms that started last week.  Dysuria- no(states she never had burning when she had uti_ Frequent urination-yes. She thinks seems like all day. Hesitancy-no Suprapubic pressure-yes. Fever-no chills-no Nausea-no Vomiting-no CVA pain-rt side. History of UTI- yes. Has not had uti in long time but has not had any recently. Gross hematuria-no. Occasional odor to urine. Pt does feel fatigued.  Hx of uti that ua looked normal but culture positive per pt report.  Patient is had some ear pain recently and saw ENT today.  They stated wax present and probable ear infection.  No lavage was done and they did not give antibiotic.  They advised conservative treatment at home.  Patient interested in getting lavage next week.  Review of Systems  Constitutional: Positive for fatigue. Negative for chills and fever.  HENT: Positive for ear pain. Negative for congestion.   Respiratory: Negative for cough, chest tightness and wheezing.   Cardiovascular: Negative for chest pain and palpitations.  Gastrointestinal: Negative for abdominal pain.  Genitourinary: Positive for frequency. Negative for difficulty urinating, flank pain, hematuria and urgency.  Musculoskeletal: Positive for back pain.       State rt side. Describes cva pain.  Skin: Negative for pallor and wound.  Hematological: Negative for adenopathy. Does not bruise/bleed easily.    Past Medical History:  Diagnosis Date  . Allergy   . Asthma   . B12 deficiency   . Back pain   . Bronchitis    hx  . Colon polyp 10/06/2003  . GERD (gastroesophageal reflux disease)   . History of kidney stones    per 09-02-2018 pre-op eval , suspected kidney stone , to [facility] CT abd on 7-9- for further eval , passive  . Hypertension   . Low back pain   . Osteoarthritis   . Pneumonia   . Pre-diabetes   .  Sinus trouble      Social History   Socioeconomic History  . Marital status: Single    Spouse name: Not on file  . Number of children: 1  . Years of education: Not on file  . Highest education level: Not on file  Occupational History  . Occupation: Product manager: [employer]  Tobacco Use  . Smoking status: Never Smoker  . Smokeless tobacco: Never Used  Vaping Use  . Vaping Use: Never used  Substance and Sexual Activity  . Alcohol use: No  . Drug use: No  . Sexual activity: Yes    Partners: Male    Birth control/protection: Post-menopausal  Other Topics Concern  . Not on file  Social History Narrative   Divorced   One daughter- lives locally and one grandson   Retired Pharmacist, hospital,  Has masters degree   Enjoys reading, spending time with her grandson   Social Determinants of Radio broadcast assistant Strain: Low Risk   . Difficulty of Paying Living Expenses: Not hard at all  Food Insecurity: No Food Insecurity  . Worried About Charity fundraiser in the Last Year: Never true  . Ran Out of Food in the Last Year: Never true  Transportation Needs: No Transportation Needs  . Lack of Transportation (Medical): No  . Lack of Transportation (Non-Medical): No  Physical Activity:   . Days of Exercise per Week: Not on file  .  Minutes of Exercise per Session: Not on file  Stress:   . Feeling of Stress : Not on file  Social Connections:   . Frequency of Communication with Friends and Family: Not on file  . Frequency of Social Gatherings with Friends and Family: Not on file  . Attends Religious Services: Not on file  . Active Member of Clubs or Organizations: Not on file  . Attends Archivist Meetings: Not on file  . Marital Status: Not on file  Intimate Partner Violence:   . Fear of Current or Ex-Partner: Not on file  . Emotionally Abused: Not on file  . Physically Abused: Not on file  . Sexually Abused: Not on file    Past Surgical History:   Procedure Laterality Date  . CHOLECYSTECTOMY    . COLONOSCOPY    . COLONOSCOPY W/ BIOPSIES  10/06/2003  . LUMBAR FUSION  2008  . PARTIAL HIP ARTHROPLASTY Right 2009    hip replacement  . TONSILLECTOMY    . TOTAL HIP ARTHROPLASTY Left 09/10/2018   Procedure: TOTAL HIP ARTHROPLASTY ANTERIOR APPROACH;  Surgeon: Gaynelle Arabian, MD;  Location: [facility];  Service: Orthopedics;  Laterality: Left;  . TOTAL KNEE ARTHROPLASTY Right 11/16/2013   Procedure: RIGHT TOTAL KNEE ARTHROPLASTY;  Surgeon: Vickey Huger, MD;  Location: [place];  Service: Orthopedics;  Laterality: Right;  [name] VIDEO BRONCHOSCOPY Bilateral 12/05/2012   Procedure: VIDEO BRONCHOSCOPY WITHOUT FLUORO;  Surgeon: Collene Gobble, MD;  Location: [facility];  Service: Cardiopulmonary;  Laterality: Bilateral;    Family History  Problem Relation Age of Onset  . COPD Father        died at age 76  . Emphysema Father   . Heart Problems Father   . Heart disease Brother        stent with 90%  . COPD Mother   . Heart disease Mother   . Ovarian cancer Mother   . Emphysema Mother   . Hypertension Mother   . Hyperlipidemia Mother   . Heart Problems Mother   . Stroke Mother   . Thyroid disease Mother   . Cancer Mother   . Breast cancer Maternal Aunt   . Colon cancer Neg Hx   . Esophageal cancer Neg Hx   . Rectal cancer Neg Hx   . Stomach cancer Neg Hx     Allergies  Allergen Reactions  . Pantoprazole Sodium Palpitations    Current Outpatient Medications on File Prior to Visit  Medication Sig Dispense Refill  . amLODipine (NORVASC) 5 MG tablet Take 1 tablet (5 mg total) by mouth daily. 90 tablet 1  . budesonide-formoterol (SYMBICORT) 160-4.5 MCG/ACT inhaler Inhale 2 puffs into the lungs 2 (two) times daily.    [name] dexlansoprazole (DEXILANT) 60 MG capsule Take 1 capsule (60 mg total) by mouth daily. 90 capsule 3  . diclofenac sodium (VOLTAREN) 1 % GEL Apply 2 g topically 4 (four) times daily as needed. (Patient taking differently: Apply  2 g topically 4 (four) times daily as needed (knee pain). ) 100 g 3  . montelukast (SINGULAIR) 10 MG tablet Take 10 mg by mouth at bedtime.    [name] omalizumab (XOLAIR) 150 MG injection Inject 150 mg into the skin every 14 (fourteen) days.    [name] albuterol (VENTOLIN HFA) 108 (90 Base) MCG/ACT inhaler Inhale into the lungs every 6 (six) hours as needed for wheezing or shortness of breath. (Patient not taking: Reported on 12/16/2019)    . Cholecalciferol (VITAMIN D3) 75 MCG (  3000 UT) TABS Take 1 tablet by mouth once daily. (Patient not taking: Reported on 09/03/2019) 90 tablet 1  . cyclobenzaprine (FLEXERIL) 10 MG tablet Take 1 tablet (10 mg total) by mouth 3 (three) times daily as needed for muscle spasms. (Patient not taking: Reported on 12/16/2019) 50 tablet 1  . docusate sodium (COLACE) 100 MG capsule Take 1 capsule (100 mg total) by mouth 2 (two) times daily. (Patient not taking: Reported on 12/16/2019) 60 capsule 0  . oxyCODONE-acetaminophen (PERCOCET/ROXICET) 5-325 MG tablet Take 1-2 tablets by mouth every 4 (four) hours as needed for moderate pain. (Patient not taking: Reported on 12/16/2019) 30 tablet 0  . [DISCONTINUED] amLODipine (NORVASC) 5 MG tablet TAKE 1 TABLET BY MOUTH EVERY DAY 90 tablet 1   No current facility-administered medications on file prior to visit.    BP (!) 134/55   Pulse 87   Temp 99.2 F (37.3 C) (Oral)   Resp 18   Ht 5\' 3"  (1.6 m)   Wt 196 lb (88.9 kg)   SpO2 94%   BMI 34.72 kg/m       Objective:   Physical Exam  General- No acute distress. Pleasant patient. Neck- Full range of motion, no jvd Lungs- Clear, even and unlabored. Heart- regular rate and rhythm. Neurologic- CNII- XII grossly intact. Abdomen-soft, nondistended, positive bowel sounds, no rebound, no guarding organomegaly.  Presently no suprapubic tenderness but she does report pressure relieved when she urinates.  Back-right-sided moderate to severe CVA tenderness.  On inspection of the skin there  is no rash or redness.  No induration.  HEENT-left ear canal has moderate wax present.  Small portion of TM visualized and looks a little dull.  Bottom portion looks minimally pinkish-red at best.  Beneath left ear 1 small palpable slightly tender lymph node.      Assessment & Plan:  Recent signs and symptoms that favor urinary tract infection.  Costovertebral angle tenderness causes concern for kidney infection.  Urine is clear but patient does report in the past has had infections positive urine culture but initial UA looked negative.  We will give Rocephin 1 g IM.  Prescribe Augmentin antibiotic.  Follow culture and change antibiotic if necessary.  Diflucan Rx made available if any yeast infection occurs.  Recent ear pain with potential ear infection/otitis media.  Wax obscuring view.  Counseled that Augmentin does have good coverage for ear infections.  Follow-up next Tuesday for possible lavage/wax removal.  That is provided tenderness/pain resolved significantly.

## 2019-12-16 NOTE — Addendum Note (Signed)
Addended by: Jeronimo Greaves on: 12/16/2019 04:20 PM   Modules accepted: Orders

## 2019-12-17 DIAGNOSIS — R300 Dysuria: Secondary | ICD-10-CM | POA: Diagnosis not present

## 2019-12-18 LAB — URINE CULTURE
MICRO NUMBER:: 11101351
SPECIMEN QUALITY:: ADEQUATE

## 2019-12-19 ENCOUNTER — Encounter: Payer: Self-pay | Admitting: Medical

## 2019-12-20 ENCOUNTER — Encounter: Payer: Self-pay | Admitting: Medical

## 2019-12-20 ENCOUNTER — Other Ambulatory Visit: Payer: Self-pay | Admitting: Family

## 2019-12-21 ENCOUNTER — Telehealth: Payer: Self-pay | Admitting: Medical

## 2019-12-21 MED ORDER — NEOMYCIN-POLYMYXIN-HC 3.5-10000-1 OT SOLN: 3.0000 [drp] | Freq: Four times a day (QID) | OTIC | 0 refills | Status: DC

## 2019-12-21 MED ORDER — CEPHALEXIN 500 MG PO CAPS: 500.0000 mg | ORAL_CAPSULE | Freq: Two times a day (BID) | ORAL | 0 refills | Status: DC

## 2019-12-21 NOTE — Telephone Encounter (Signed)
Rx keflex sent to pt pharmacy.  

## 2019-12-21 NOTE — Telephone Encounter (Signed)
Rx cotrisportin otic sent to pt pharmacy.

## 2019-12-31 ENCOUNTER — Ambulatory Visit: Payer: Medicare PPO | Admitting: Medical

## 2019-12-31 ENCOUNTER — Other Ambulatory Visit: Payer: Self-pay

## 2019-12-31 ENCOUNTER — Ambulatory Visit (HOSPITAL_BASED_OUTPATIENT_CLINIC_OR_DEPARTMENT_OTHER)
Admission: RE | Admit: 2019-12-31 | Discharge: 2019-12-31 | Disposition: A | Discharge status: Home / Self Care | Payer: Medicare PPO | Source: Ambulatory Visit | Attending: Medical | Admitting: Medical

## 2019-12-31 VITALS — BP 139/71 | HR 70 | Resp 18 | Ht 63.0 in | Wt 194.0 lb

## 2019-12-31 DIAGNOSIS — H6122 Impacted cerumen, left ear: Secondary | ICD-10-CM | POA: Diagnosis not present

## 2019-12-31 DIAGNOSIS — R590 Localized enlarged lymph nodes: Secondary | ICD-10-CM | POA: Diagnosis not present

## 2019-12-31 DIAGNOSIS — R221 Localized swelling, mass and lump, neck: Secondary | ICD-10-CM | POA: Diagnosis not present

## 2019-12-31 DIAGNOSIS — Z23 Encounter for immunization: Secondary | ICD-10-CM

## 2019-12-31 DIAGNOSIS — H6690 Otitis media, unspecified, unspecified ear: Secondary | ICD-10-CM

## 2019-12-31 LAB — CBC WITH DIFFERENTIAL/PLATELET
Absolute Monocytes: 507 cells/uL (ref 200–950)
Basophils Absolute: 104 cells/uL (ref 0–200)
Basophils Relative: 1.6 %
Eosinophils Absolute: 189 cells/uL (ref 15–500)
Eosinophils Relative: 2.9 %
HCT: 32.8 % — ABNORMAL LOW (ref 35.0–45.0)
Hemoglobin: 10.3 g/dL — ABNORMAL LOW (ref 11.7–15.5)
Lymphs Abs: 2711 cells/uL (ref 850–3900)
MCH: 23.5 pg — ABNORMAL LOW (ref 27.0–33.0)
MCHC: 31.4 g/dL — ABNORMAL LOW (ref 32.0–36.0)
MCV: 74.7 fL — ABNORMAL LOW (ref 80.0–100.0)
MPV: 10.5 fL (ref 7.5–12.5)
Monocytes Relative: 7.8 %
Neutro Abs: 2990 cells/uL (ref 1500–7800)
Neutrophils Relative %: 46 %
Platelets: 378 10*3/uL (ref 140–400)
RBC: 4.39 10*6/uL (ref 3.80–5.10)
RDW: 17.1 % — ABNORMAL HIGH (ref 11.0–15.0)
Total Lymphocyte: 41.7 %
WBC: 6.5 10*3/uL (ref 3.8–10.8)

## 2019-12-31 MED ORDER — CEPHALEXIN 500 MG PO CAPS: 500.0000 mg | ORAL_CAPSULE | Freq: Two times a day (BID) | ORAL | 0 refills | Status: DC

## 2019-12-31 NOTE — Patient Instructions (Addendum)
Ear infection resolved and wax cleared with lavage today.  Urinary symptoms resolved. Your culture was negative.  Lymphadenopathy with neck pain left side just above thyroid in anterior lymph node chain area. Will get cbc and US soft tissue of neck. Gave additonal 3 days of keflex today.  Follow up 3 weeks or as needed.

## 2019-12-31 NOTE — Progress Notes (Signed)
Subjective:    Patient ID: Brandy Dunlap, female    DOB: Oct 13, 1950, 69 y.o.   MRN: [id number]  HPI  Pt in with left ear wax history. See last note. Pt saw audiologist then saw me last visit. They said saw wax but did not remove. Then pt saw me.    I advised augmentin ear infection(she had ear pain at that time). Then switched to keflex due to augmentin.gi side effects.  Pt did not have uti by culture. Studies came back negative. No longer has any urinary symptoms.  Her ear does feel better but still feels some clogged.   Pt has some tenderness left side of neck. Points to area anterior cervical node region in level of thyroid region. Pt saw dentist 6 months ago. No teeth pain. Area still tender. She states tender on last visit.    Review of Systems  Constitutional: Negative for chills, fatigue and fever.  HENT: Negative for congestion, ear pain, postnasal drip and rhinorrhea.        Ear feels blocked.  Respiratory: Negative for cough, chest tightness, shortness of breath and wheezing.   Cardiovascular: Negative for chest pain and palpitations.  Gastrointestinal: Negative for abdominal pain, constipation and nausea.  Genitourinary: Negative for dysuria, flank pain, frequency and hematuria.  Musculoskeletal: Positive for neck pain. Negative for back pain.  Skin: Negative for rash.  Neurological: Negative for dizziness, speech difficulty, numbness and headaches.  Hematological: Positive for adenopathy. Does not bruise/bleed easily.  Psychiatric/Behavioral: Negative for behavioral problems and confusion.   Past Medical History:  Diagnosis Date  . Allergy   . Asthma   . B12 deficiency   . Back pain   . Bronchitis    hx  . Colon polyp 10/06/2003  . GERD (gastroesophageal reflux disease)   . History of kidney stones    per 09-02-2018 pre-op eval , suspected kidney stone , to [facility] CT abd on 7-9- for further eval , passive  . Hypertension   . Low back pain   .  Osteoarthritis   . Pneumonia   . Pre-diabetes   . Sinus trouble      Social History   Socioeconomic History  . Marital status: Single    Spouse name: Not on file  . Number of children: 1  . Years of education: Not on file  . Highest education level: Not on file  Occupational History  . Occupation: Product manager: [employer]  Tobacco Use  . Smoking status: Never Smoker  . Smokeless tobacco: Never Used  Vaping Use  . Vaping Use: Never used  Substance and Sexual Activity  . Alcohol use: No  . Drug use: No  . Sexual activity: Yes    Partners: Male    Birth control/protection: Post-menopausal  Other Topics Concern  . Not on file  Social History Narrative   Divorced   One daughter- lives locally and one grandson   Retired Pharmacist, hospital,  Has masters degree   Enjoys reading, spending time with her grandson   Social Determinants of Radio broadcast assistant Strain: Low Risk   . Difficulty of Paying Living Expenses: Not hard at all  Food Insecurity: No Food Insecurity  . Worried About Charity fundraiser in the Last Year: Never true  . Ran Out of Food in the Last Year: Never true  Transportation Needs: No Transportation Needs  . Lack of Transportation (Medical): No  . Lack of Transportation (Non-Medical): No  Physical Activity:   . Days of Exercise per Week: Not on file  . Minutes of Exercise per Session: Not on file  Stress:   . Feeling of Stress : Not on file  Social Connections:   . Frequency of Communication with Friends and Family: Not on file  . Frequency of Social Gatherings with Friends and Family: Not on file  . Attends Religious Services: Not on file  . Active Member of Clubs or Organizations: Not on file  . Attends Archivist Meetings: Not on file  . Marital Status: Not on file  Intimate Partner Violence:   . Fear of Current or Ex-Partner: Not on file  . Emotionally Abused: Not on file  . Physically Abused: Not on file  .  Sexually Abused: Not on file    Past Surgical History:  Procedure Laterality Date  . CHOLECYSTECTOMY    . COLONOSCOPY    . COLONOSCOPY W/ BIOPSIES  10/06/2003  . LUMBAR FUSION  2008  . PARTIAL HIP ARTHROPLASTY Right 2009    hip replacement  . TONSILLECTOMY    . TOTAL HIP ARTHROPLASTY Left 09/10/2018   Procedure: TOTAL HIP ARTHROPLASTY ANTERIOR APPROACH;  Surgeon: Gaynelle Arabian, MD;  Location: [facility];  Service: Orthopedics;  Laterality: Left;  . TOTAL KNEE ARTHROPLASTY Right 11/16/2013   Procedure: RIGHT TOTAL KNEE ARTHROPLASTY;  Surgeon: Vickey Huger, MD;  Location: [place];  Service: Orthopedics;  Laterality: Right;  [name] VIDEO BRONCHOSCOPY Bilateral 12/05/2012   Procedure: VIDEO BRONCHOSCOPY WITHOUT FLUORO;  Surgeon: Collene Gobble, MD;  Location: [facility];  Service: Cardiopulmonary;  Laterality: Bilateral;    Family History  Problem Relation Age of Onset  . COPD Father        died at age 56  . Emphysema Father   . Heart Problems Father   . Heart disease Brother        stent with 90%  . COPD Mother   . Heart disease Mother   . Ovarian cancer Mother   . Emphysema Mother   . Hypertension Mother   . Hyperlipidemia Mother   . Heart Problems Mother   . Stroke Mother   . Thyroid disease Mother   . Cancer Mother   . Breast cancer Maternal Aunt   . Colon cancer Neg Hx   . Esophageal cancer Neg Hx   . Rectal cancer Neg Hx   . Stomach cancer Neg Hx     Allergies  Allergen Reactions  . Pantoprazole Sodium Palpitations    Current Outpatient Medications on File Prior to Visit  Medication Sig Dispense Refill  . amLODipine (NORVASC) 5 MG tablet TAKE 1 TABLET BY MOUTH EVERY DAY 90 tablet 0  . budesonide-formoterol (SYMBICORT) 160-4.5 MCG/ACT inhaler Inhale 2 puffs into the lungs 2 (two) times daily.    [name] dexlansoprazole (DEXILANT) 60 MG capsule Take 1 capsule (60 mg total) by mouth daily. 90 capsule 3  . docusate sodium (COLACE) 100 MG capsule Take 1 capsule (100 mg total) by  mouth 2 (two) times daily. 60 capsule 0  . montelukast (SINGULAIR) 10 MG tablet Take 10 mg by mouth at bedtime.    [name] omalizumab (XOLAIR) 150 MG injection Inject 150 mg into the skin every 14 (fourteen) days.    [name] albuterol (VENTOLIN HFA) 108 (90 Base) MCG/ACT inhaler Inhale into the lungs every 6 (six) hours as needed for wheezing or shortness of breath. (Patient not taking: Reported on 12/16/2019)    . cephALEXin (KEFLEX) 500 MG capsule Take  1 capsule (500 mg total) by mouth 2 (two) times daily. (Patient not taking: Reported on 12/31/2019) 14 capsule 0  . Cholecalciferol (VITAMIN D3) 75 MCG (3000 UT) TABS Take 1 tablet by mouth once daily. (Patient not taking: Reported on 09/03/2019) 90 tablet 1  . cyclobenzaprine (FLEXERIL) 10 MG tablet Take 1 tablet (10 mg total) by mouth 3 (three) times daily as needed for muscle spasms. (Patient not taking: Reported on 12/16/2019) 50 tablet 1  . diclofenac sodium (VOLTAREN) 1 % GEL Apply 2 g topically 4 (four) times daily as needed. (Patient not taking: Reported on 12/31/2019) 100 g 3  . neomycin-polymyxin-hydrocortisone (CORTISPORIN) OTIC solution Place 3 drops into the left ear 4 (four) times daily. (Patient not taking: Reported on 12/31/2019) 10 mL 0  . oxyCODONE-acetaminophen (PERCOCET/ROXICET) 5-325 MG tablet Take 1-2 tablets by mouth every 4 (four) hours as needed for moderate pain. (Patient not taking: Reported on 12/16/2019) 30 tablet 0  . [DISCONTINUED] amLODipine (NORVASC) 5 MG tablet TAKE 1 TABLET BY MOUTH EVERY DAY 90 tablet 1   No current facility-administered medications on file prior to visit.    BP 139/71   Pulse 70   Resp 18   Ht 5\' 3"  (1.6 m)   Wt 194 lb (88 kg)   SpO2 97%   BMI 34.37 kg/m       Objective:   Physical Exam  General- No acute distress. Pleasant patient. Neck- Full range of motion, no jvd left side of neck/anterior cervical area just above level of thyroid tender area. Possible enlarged lymph node. Lungs- Clear, even and  unlabored. Heart- regular rate and rhythm. Neurologic- CNII- XII grossly intact.   HEENT-left ear canal has moderate wax present.  Small portion of TM visualized and looks normal now  Beneath left ear no palpable lymph node. Post lavage tm normal and canal clear.      Assessment & Plan:  Ear infection resolved and wax cleared with lavage today.  Urinary symptoms resolved. Your culture was negative.  Lymphadenopathy with neck pain left side just above thyroid in anterior lymph node chain area. Will get cbc and US soft tissue of neck. Gave additonal 3 days of keflex today.  Follow up 3 weeks or as needed.  Mackie Pai, PA-C   Time spent with patient today was 30+  minutes which consisted of chart review, discussing diagnosis, work up, treatment and documentation.

## 2020-01-05 DIAGNOSIS — J4540 Moderate persistent asthma, uncomplicated: Secondary | ICD-10-CM | POA: Diagnosis not present

## 2020-01-08 DIAGNOSIS — H52223 Regular astigmatism, bilateral: Secondary | ICD-10-CM | POA: Diagnosis not present

## 2020-01-08 DIAGNOSIS — H5203 Hypermetropia, bilateral: Secondary | ICD-10-CM | POA: Diagnosis not present

## 2020-01-08 DIAGNOSIS — Z135 Encounter for screening for eye and ear disorders: Secondary | ICD-10-CM | POA: Diagnosis not present

## 2020-01-08 DIAGNOSIS — H524 Presbyopia: Secondary | ICD-10-CM | POA: Diagnosis not present

## 2020-01-20 DIAGNOSIS — J4540 Moderate persistent asthma, uncomplicated: Secondary | ICD-10-CM | POA: Diagnosis not present

## 2020-01-26 DIAGNOSIS — M48061 Spinal stenosis, lumbar region without neurogenic claudication: Secondary | ICD-10-CM | POA: Diagnosis not present

## 2020-01-27 DIAGNOSIS — J3089 Other allergic rhinitis: Secondary | ICD-10-CM | POA: Diagnosis not present

## 2020-01-27 DIAGNOSIS — J301 Allergic rhinitis due to pollen: Secondary | ICD-10-CM | POA: Diagnosis not present

## 2020-01-27 DIAGNOSIS — J3081 Allergic rhinitis due to animal (cat) (dog) hair and dander: Secondary | ICD-10-CM | POA: Diagnosis not present

## 2020-01-27 DIAGNOSIS — J4540 Moderate persistent asthma, uncomplicated: Secondary | ICD-10-CM | POA: Diagnosis not present

## 2020-02-01 DIAGNOSIS — J4540 Moderate persistent asthma, uncomplicated: Secondary | ICD-10-CM | POA: Diagnosis not present

## 2020-02-16 DIAGNOSIS — H903 Sensorineural hearing loss, bilateral: Secondary | ICD-10-CM | POA: Diagnosis not present

## 2020-02-18 DIAGNOSIS — J4540 Moderate persistent asthma, uncomplicated: Secondary | ICD-10-CM | POA: Diagnosis not present

## 2020-03-07 ENCOUNTER — Other Ambulatory Visit: Payer: Self-pay | Admitting: Family

## 2020-03-08 DIAGNOSIS — J4540 Moderate persistent asthma, uncomplicated: Secondary | ICD-10-CM | POA: Diagnosis not present

## 2020-03-09 ENCOUNTER — Other Ambulatory Visit: Payer: Self-pay | Admitting: Family

## 2020-03-22 DIAGNOSIS — J4540 Moderate persistent asthma, uncomplicated: Secondary | ICD-10-CM | POA: Diagnosis not present

## 2020-03-31 ENCOUNTER — Other Ambulatory Visit: Payer: Self-pay | Admitting: Family

## 2020-04-04 ENCOUNTER — Ambulatory Visit: Payer: Medicare PPO | Admitting: Family

## 2020-04-04 ENCOUNTER — Other Ambulatory Visit: Payer: Self-pay

## 2020-04-04 ENCOUNTER — Encounter: Payer: Self-pay | Admitting: Family

## 2020-04-04 ENCOUNTER — Telehealth: Payer: Self-pay | Admitting: Family

## 2020-04-04 VITALS — BP 140/61 | HR 73 | Temp 99.2°F | Resp 16 | Ht 63.0 in | Wt 196.0 lb

## 2020-04-04 DIAGNOSIS — R5383 Other fatigue: Secondary | ICD-10-CM

## 2020-04-04 DIAGNOSIS — I10 Essential (primary) hypertension: Secondary | ICD-10-CM

## 2020-04-04 DIAGNOSIS — D649 Anemia, unspecified: Secondary | ICD-10-CM

## 2020-04-04 DIAGNOSIS — R739 Hyperglycemia, unspecified: Secondary | ICD-10-CM

## 2020-04-04 DIAGNOSIS — E538 Deficiency of other specified B group vitamins: Secondary | ICD-10-CM

## 2020-04-04 LAB — CBC WITH DIFFERENTIAL/PLATELET
Basophils Absolute: 0.1 10*3/uL (ref 0.0–0.1)
Basophils Relative: 2 % (ref 0.0–3.0)
Eosinophils Absolute: 0.3 10*3/uL (ref 0.0–0.7)
Eosinophils Relative: 5.1 % — ABNORMAL HIGH (ref 0.0–5.0)
HCT: 32.5 % — ABNORMAL LOW (ref 36.0–46.0)
Hemoglobin: 10.5 g/dL — ABNORMAL LOW (ref 12.0–15.0)
Lymphocytes Relative: 42.6 % (ref 12.0–46.0)
Lymphs Abs: 2.2 10*3/uL (ref 0.7–4.0)
MCHC: 32.3 g/dL (ref 30.0–36.0)
MCV: 71.7 fl — ABNORMAL LOW (ref 78.0–100.0)
Monocytes Absolute: 0.4 10*3/uL (ref 0.1–1.0)
Monocytes Relative: 7.2 % (ref 3.0–12.0)
Neutro Abs: 2.3 10*3/uL (ref 1.4–7.7)
Neutrophils Relative %: 43.1 % (ref 43.0–77.0)
Platelets: 389 10*3/uL (ref 150.0–400.0)
RBC: 4.53 Mil/uL (ref 3.87–5.11)
RDW: 19.9 % — ABNORMAL HIGH (ref 11.5–15.5)
WBC: 5.2 10*3/uL (ref 4.0–10.5)

## 2020-04-04 LAB — BASIC METABOLIC PANEL
BUN: 8 mg/dL (ref 6–23)
CO2: 26 mEq/L (ref 19–32)
Calcium: 9.6 mg/dL (ref 8.4–10.5)
Chloride: 105 mEq/L (ref 96–112)
Creatinine, Ser: 0.73 mg/dL (ref 0.40–1.20)
GFR: 83.91 mL/min (ref >60.00)
Glucose, Bld: 100 mg/dL — ABNORMAL HIGH (ref 70–99)
Potassium: 4.5 mEq/L (ref 3.5–5.1)
Sodium: 138 mEq/L (ref 135–145)

## 2020-04-04 LAB — HEMOGLOBIN A1C: Hgb A1c MFr Bld: 6.3 % (ref 4.6–6.5)

## 2020-04-04 LAB — B12 AND FOLATE PANEL
Folate: 7.3 ng/mL (ref >5.9)
Vitamin B-12: 227 pg/mL (ref 211–911)

## 2020-04-04 LAB — TSH: TSH: 1.24 u[IU]/mL (ref 0.35–4.50)

## 2020-04-04 LAB — IRON: Iron: 23 ug/dL — ABNORMAL LOW (ref 42–145)

## 2020-04-04 LAB — FERRITIN: Ferritin: 6.3 ng/mL — ABNORMAL LOW (ref 10.0–291.0)

## 2020-04-04 MED ORDER — AMOXICILLIN-POT CLAVULANATE 875-125 MG PO TABS: 1.0000 | ORAL_TABLET | Freq: Two times a day (BID) | ORAL | 0 refills | Status: DC

## 2020-04-04 MED ORDER — FLUCONAZOLE 150 MG PO TABS: 150.0000 mg | ORAL_TABLET | Freq: Once | ORAL | 0 refills | Status: AC

## 2020-04-04 MED ORDER — PROAIR RESPICLICK 108 (90 BASE) MCG/ACT IN AEPB: INHALATION_SPRAY | RESPIRATORY_TRACT | Status: DC

## 2020-04-04 NOTE — Telephone Encounter (Signed)
Patient advised of results, new indication to take iron and ducolax if needed. She reports she has the IFOB kit and will return as soon as she can

## 2020-04-04 NOTE — Progress Notes (Signed)
Subjective:    Patient ID: Brandy Dunlap, female    DOB: January 02, 1951, 70 y.o.   MRN: [id number]  HPI  Patient is a 70 yr old female who presents today for follow up.  HTN- She continues amlodipine 5mg .   BP Readings from Last 3 Encounters:  04/04/20 140/61  12/31/19 139/71  12/16/19 (!) 134/55   Facial pain- reports sinus pain/pressure/drainage. Reports that she is blowing green nasal drainage.  Using sinus medication   Fatigue-   Lab Results  Component Value Date   HGBA1C 6.1 (H) 09/04/2018     Review of Systems See HPI  Past Medical History:  Diagnosis Date  . Allergy   . Asthma   . B12 deficiency   . Back pain   . Bronchitis    hx  . Colon polyp 10/06/2003  . GERD (gastroesophageal reflux disease)   . History of kidney stones    per 09-02-2018 pre-op eval , suspected kidney stone , to [facility] CT abd on 7-9- for further eval , passive  . Hypertension   . Low back pain   . Osteoarthritis   . Pneumonia   . Pre-diabetes   . Sinus trouble      Social History   Socioeconomic History  . Marital status: Single    Spouse name: Not on file  . Number of children: 1  . Years of education: Not on file  . Highest education level: Not on file  Occupational History  . Occupation: Product manager: [employer]  Tobacco Use  . Smoking status: Never Smoker  . Smokeless tobacco: Never Used  Vaping Use  . Vaping Use: Never used  Substance and Sexual Activity  . Alcohol use: No  . Drug use: No  . Sexual activity: Yes    Partners: Male    Birth control/protection: Post-menopausal  Other Topics Concern  . Not on file  Social History Narrative   Divorced   One daughter- lives locally and one grandson   Retired Pharmacist, hospital,  Has masters degree   Enjoys reading, spending time with her grandson   Social Determinants of Radio broadcast assistant Strain: Low Risk   . Difficulty of Paying Living Expenses: Not hard at all  Food Insecurity: No Food  Insecurity  . Worried About Charity fundraiser in the Last Year: Never true  . Ran Out of Food in the Last Year: Never true  Transportation Needs: No Transportation Needs  . Lack of Transportation (Medical): No  . Lack of Transportation (Non-Medical): No  Physical Activity: Not on file  Stress: Not on file  Social Connections: Not on file  Intimate Partner Violence: Not on file    Past Surgical History:  Procedure Laterality Date  . CHOLECYSTECTOMY    . COLONOSCOPY    . COLONOSCOPY W/ BIOPSIES  10/06/2003  . LUMBAR FUSION  2008  . PARTIAL HIP ARTHROPLASTY Right 2009    hip replacement  . TONSILLECTOMY    . TOTAL HIP ARTHROPLASTY Left 09/10/2018   Procedure: TOTAL HIP ARTHROPLASTY ANTERIOR APPROACH;  Surgeon: Gaynelle Arabian, MD;  Location: [facility];  Service: Orthopedics;  Laterality: Left;  . TOTAL KNEE ARTHROPLASTY Right 11/16/2013   Procedure: RIGHT TOTAL KNEE ARTHROPLASTY;  Surgeon: Vickey Huger, MD;  Location: [facility];  Service: Orthopedics;  Laterality: Right;  [name] VIDEO BRONCHOSCOPY Bilateral 12/05/2012   Procedure: VIDEO BRONCHOSCOPY WITHOUT FLUORO;  Surgeon: Collene Gobble, MD;  Location: [facility];  Service:  Cardiopulmonary;  Laterality: Bilateral;    Family History  Problem Relation Age of Onset  . COPD Father        died at age 5  . Emphysema Father   . Heart Problems Father   . Heart disease Brother        stent with 90%  . COPD Mother   . Heart disease Mother   . Ovarian cancer Mother   . Emphysema Mother   . Hypertension Mother   . Hyperlipidemia Mother   . Heart Problems Mother   . Stroke Mother   . Thyroid disease Mother   . Cancer Mother   . Breast cancer Maternal Aunt   . Colon cancer Neg Hx   . Esophageal cancer Neg Hx   . Rectal cancer Neg Hx   . Stomach cancer Neg Hx     Allergies  Allergen Reactions  . Pantoprazole Sodium Palpitations    Current Outpatient Medications on File Prior to Visit  Medication Sig Dispense Refill  . amLODipine  (NORVASC) 5 MG tablet TAKE 1 TABLET BY MOUTH EVERY DAY 30 tablet 0  . budesonide-formoterol (SYMBICORT) 160-4.5 MCG/ACT inhaler Inhale 2 puffs into the lungs 2 (two) times daily.    . Cholecalciferol (VITAMIN D3) 75 MCG (3000 UT) TABS Take 1 tablet by mouth once daily. 90 tablet 1  . cyclobenzaprine (FLEXERIL) 10 MG tablet Take 1 tablet (10 mg total) by mouth 3 (three) times daily as needed for muscle spasms. 50 tablet 1  . dexlansoprazole (DEXILANT) 60 MG capsule Take 1 capsule (60 mg total) by mouth daily. 90 capsule 3  . diclofenac sodium (VOLTAREN) 1 % GEL Apply 2 g topically 4 (four) times daily as needed. 100 g 3  . docusate sodium (COLACE) 100 MG capsule Take 1 capsule (100 mg total) by mouth 2 (two) times daily. 60 capsule 0  . montelukast (SINGULAIR) 10 MG tablet Take 10 mg by mouth at bedtime.    [name] neomycin-polymyxin-hydrocortisone (CORTISPORIN) OTIC solution Place 3 drops into the left ear 4 (four) times daily. 10 mL 0  . omalizumab (XOLAIR) 150 MG injection Inject 150 mg into the skin every 14 (fourteen) days.     No current facility-administered medications on file prior to visit.    BP 140/61 (BP Location: Right Arm, Patient Position: Sitting, Cuff Size: Small)   Pulse 73   Temp 99.2 F (37.3 C) (Oral)   Resp 16   Ht 5\' 3"  (1.6 m)   Wt 196 lb (88.9 kg)   SpO2 97%   BMI 34.72 kg/m       Objective:   Physical Exam Constitutional:      Appearance: She is well-developed and well-nourished.  HENT:     Head: Normocephalic and atraumatic.     Right Ear: Tympanic membrane and ear canal normal.     Left Ear: Tympanic membrane and ear canal normal.     Nose:     Right Sinus: Frontal sinus tenderness present.     Left Sinus: Frontal sinus tenderness present.     Mouth/Throat:     Mouth: Mucous membranes are moist.     Tongue: No lesions.  Cardiovascular:     Rate and Rhythm: Normal rate and regular rhythm.     Heart sounds: Normal heart sounds. No murmur  heard.   Pulmonary:     Effort: Pulmonary effort is normal. No respiratory distress.     Breath sounds: Normal breath sounds. No wheezing.  Psychiatric:  Mood and Affect: Mood and affect normal.        Behavior: Behavior normal.        Thought Content: Thought content normal.        Judgment: Judgment normal.           Assessment & Plan:  HTN- bp is acceptable for her age.  Continue amlodipine 5mg  once daily.  Fatigue- could be due to sinusitis, but also anemia could be contributing. See below.  Check TSH.  Microcytic anemia- noted on labs performed back in November.  Will repeat CBC and check b12/folate/iron studies and IFOB.    Maxillary sinusitis- rx with augmentin. Rx provided for diflucan as needed.   Hyperglycemia- obtain A1C.  This visit occurred during the SARS-CoV-2 public health emergency.  Safety protocols were in place, including screening questions prior to the visit, additional usage of staff PPE, and extensive cleaning of exam room while observing appropriate contact time as indicated for disinfecting solutions.

## 2020-04-04 NOTE — Telephone Encounter (Signed)
Iron level is low. Anemia is about the same. I would like her to add iron 325mg  twice daily otc. If needed she can add colace stool softner 1-2 times daily.  Please complete IFOB and return at her earliest convenience.  In addition, sugar remains stable.

## 2020-04-06 DIAGNOSIS — J4540 Moderate persistent asthma, uncomplicated: Secondary | ICD-10-CM | POA: Diagnosis not present

## 2020-04-07 ENCOUNTER — Other Ambulatory Visit (INDEPENDENT_AMBULATORY_CARE_PROVIDER_SITE_OTHER): Payer: Medicare PPO

## 2020-04-07 DIAGNOSIS — D649 Anemia, unspecified: Secondary | ICD-10-CM

## 2020-04-07 LAB — FECAL OCCULT BLOOD, IMMUNOCHEMICAL: Fecal Occult Bld: NEGATIVE

## 2020-04-25 ENCOUNTER — Other Ambulatory Visit: Payer: Self-pay | Admitting: Family

## 2020-04-27 DIAGNOSIS — J4540 Moderate persistent asthma, uncomplicated: Secondary | ICD-10-CM | POA: Diagnosis not present

## 2020-05-10 DIAGNOSIS — J4540 Moderate persistent asthma, uncomplicated: Secondary | ICD-10-CM | POA: Diagnosis not present

## 2020-05-16 ENCOUNTER — Ambulatory Visit: Payer: Medicare PPO | Admitting: Family

## 2020-05-16 ENCOUNTER — Other Ambulatory Visit: Payer: Self-pay

## 2020-05-16 VITALS — BP 144/60 | HR 75 | Temp 98.4°F | Resp 16 | Ht 63.0 in | Wt 194.8 lb

## 2020-05-16 DIAGNOSIS — D509 Iron deficiency anemia, unspecified: Secondary | ICD-10-CM | POA: Diagnosis not present

## 2020-05-16 DIAGNOSIS — J45901 Unspecified asthma with (acute) exacerbation: Secondary | ICD-10-CM

## 2020-05-16 LAB — CBC WITH DIFFERENTIAL/PLATELET
Basophils Absolute: 0.1 10*3/uL (ref 0.0–0.1)
Basophils Relative: 1.7 % (ref 0.0–3.0)
Eosinophils Absolute: 0.4 10*3/uL (ref 0.0–0.7)
Eosinophils Relative: 6.8 % — ABNORMAL HIGH (ref 0.0–5.0)
HCT: 33.2 % — ABNORMAL LOW (ref 36.0–46.0)
Hemoglobin: 10.8 g/dL — ABNORMAL LOW (ref 12.0–15.0)
Lymphocytes Relative: 38.9 % (ref 12.0–46.0)
Lymphs Abs: 2.3 10*3/uL (ref 0.7–4.0)
MCHC: 32.5 g/dL (ref 30.0–36.0)
MCV: 71 fl — ABNORMAL LOW (ref 78.0–100.0)
Monocytes Absolute: 0.4 10*3/uL (ref 0.1–1.0)
Monocytes Relative: 6.6 % (ref 3.0–12.0)
Neutro Abs: 2.7 10*3/uL (ref 1.4–7.7)
Neutrophils Relative %: 46 % (ref 43.0–77.0)
Platelets: 369 10*3/uL (ref 150.0–400.0)
RBC: 4.67 Mil/uL (ref 3.87–5.11)
RDW: 19.6 % — ABNORMAL HIGH (ref 11.5–15.5)
WBC: 6 10*3/uL (ref 4.0–10.5)

## 2020-05-16 LAB — FERRITIN: Ferritin: 6.4 ng/mL — ABNORMAL LOW (ref 10.0–291.0)

## 2020-05-16 LAB — IRON: Iron: 24 ug/dL — ABNORMAL LOW (ref 42–145)

## 2020-05-16 MED ORDER — CETIRIZINE HCL 10 MG PO TABS: 10.0000 mg | ORAL_TABLET | Freq: Every day | ORAL | 11 refills | Status: DC

## 2020-05-16 MED ORDER — VITAMIN B-12 1000 MCG PO TABS: 1000.0000 ug | ORAL_TABLET | Freq: Every day | ORAL | Status: DC

## 2020-05-16 MED ORDER — IRON 325 (65 FE) MG PO TABS: 1.0000 | ORAL_TABLET | Freq: Two times a day (BID) | ORAL | 0 refills | Status: DC

## 2020-05-16 MED ORDER — PREDNISONE 10 MG PO TABS: ORAL_TABLET | ORAL | 0 refills | Status: DC

## 2020-05-16 NOTE — Patient Instructions (Addendum)
Please complete lab work prior to leaving.  Start prednisone taper.  Add zyrtec 10mg  once daily, continue singulair and symbicort.

## 2020-05-16 NOTE — Progress Notes (Signed)
Subjective:    Patient ID: Brandy Dunlap, female    DOB: 15-Nov-1950, 70 y.o.   MRN: [id number]  HPI  Patient is a 70 yr old female who presents today for follow up. We last saw her back on 04/04/20.  At that time she had a sinus infection and we treated her with augmentin.  We checked a TSH and CBC, b12/folate, iron studies and IFOB.   IFOB was negative, A1C stable at 6.3.  She was noted to have microcytic anemia with hgb of 10.5. Iron level/ferritin levels were both low. TSH was WNL.  bmet unremarkable except for glucose 100.  B12 was low normal.    We added iron 325mg  bid.    Notes cough x 1 week.  Thick clear phlegm.   Reports some improvement in the fatigue since she began iron.  When she works and stays busy she is OK.  When she gets   Reports that she has had sleep studies.    She is only taking singulair.   Review of Systems See HPI  Past Medical History:  Diagnosis Date  . Allergy   . Asthma   . B12 deficiency   . Back pain   . Bronchitis    hx  . Colon polyp 10/06/2003  . GERD (gastroesophageal reflux disease)   . History of kidney stones    per 09-02-2018 pre-op eval , suspected kidney stone , to [facility] CT abd on 7-9- for further eval , passive  . Hypertension   . Low back pain   . Osteoarthritis   . Pneumonia   . Pre-diabetes   . Sinus trouble      Social History   Socioeconomic History  . Marital status: Single    Spouse name: Not on file  . Number of children: 1  . Years of education: Not on file  . Highest education level: Not on file  Occupational History  . Occupation: Product manager: [employer]  Tobacco Use  . Smoking status: Never Smoker  . Smokeless tobacco: Never Used  Vaping Use  . Vaping Use: Never used  Substance and Sexual Activity  . Alcohol use: No  . Drug use: No  . Sexual activity: Yes    Partners: Male    Birth control/protection: Post-menopausal  Other Topics Concern  . Not on file  Social History  Narrative   Divorced   One daughter- lives locally and one grandson   Retired Pharmacist, hospital,  Has masters degree   Enjoys reading, spending time with her grandson   Social Determinants of Radio broadcast assistant Strain: Not on file  Food Insecurity: Not on file  Transportation Needs: Not on file  Physical Activity: Not on file  Stress: Not on file  Social Connections: Not on file  Intimate Partner Violence: Not on file    Past Surgical History:  Procedure Laterality Date  . CHOLECYSTECTOMY    . COLONOSCOPY    . COLONOSCOPY W/ BIOPSIES  10/06/2003  . LUMBAR FUSION  2008  . LUMBAR FUSION     2020  . PARTIAL HIP ARTHROPLASTY Right 2009    hip replacement  . TONSILLECTOMY    . TOTAL HIP ARTHROPLASTY Left 09/10/2018   Procedure: TOTAL HIP ARTHROPLASTY ANTERIOR APPROACH;  Surgeon: Gaynelle Arabian, MD;  Location: [facility];  Service: Orthopedics;  Laterality: Left;  . TOTAL KNEE ARTHROPLASTY Right 11/16/2013   Procedure: RIGHT TOTAL KNEE ARTHROPLASTY;  Surgeon: Vickey Huger, MD;  Location: [facility];  Service: Orthopedics;  Laterality: Right;  [name] VIDEO BRONCHOSCOPY Bilateral 12/05/2012   Procedure: VIDEO BRONCHOSCOPY WITHOUT FLUORO;  Surgeon: Collene Gobble, MD;  Location: [facility];  Service: Cardiopulmonary;  Laterality: Bilateral;    Family History  Problem Relation Age of Onset  . COPD Father        died at age 53  . Emphysema Father   . Heart Problems Father   . Heart disease Brother        stent with 90%  . COPD Mother   . Heart disease Mother   . Ovarian cancer Mother   . Emphysema Mother   . Hypertension Mother   . Hyperlipidemia Mother   . Heart Problems Mother   . Stroke Mother   . Thyroid disease Mother   . Cancer Mother   . Breast cancer Maternal Aunt   . Colon cancer Neg Hx   . Esophageal cancer Neg Hx   . Rectal cancer Neg Hx   . Stomach cancer Neg Hx     Allergies  Allergen Reactions  . Pantoprazole Sodium Palpitations    Current Outpatient Medications on  File Prior to Visit  Medication Sig Dispense Refill  . Albuterol Sulfate (PROAIR RESPICLICK) 240 (90 Base) MCG/ACT AEPB 2 puffs every 6 hours as needed.    [name] amLODipine (NORVASC) 5 MG tablet TAKE 1 TABLET BY MOUTH EVERY DAY 30 tablet 0  . amoxicillin-clavulanate (AUGMENTIN) 875-125 MG tablet Take 1 tablet by mouth 2 (two) times daily. 20 tablet 0  . budesonide-formoterol (SYMBICORT) 160-4.5 MCG/ACT inhaler Inhale 2 puffs into the lungs 2 (two) times daily.    . Cholecalciferol (VITAMIN D3) 75 MCG (3000 UT) TABS Take 1 tablet by mouth once daily. 90 tablet 1  . dexlansoprazole (DEXILANT) 60 MG capsule Take 1 capsule (60 mg total) by mouth daily. 90 capsule 3  . diclofenac sodium (VOLTAREN) 1 % GEL Apply 2 g topically 4 (four) times daily as needed. 100 g 3  . montelukast (SINGULAIR) 10 MG tablet Take 10 mg by mouth at bedtime.    [name] omalizumab (XOLAIR) 150 MG injection Inject 150 mg into the skin every 14 (fourteen) days.     No current facility-administered medications on file prior to visit.    BP (!) 144/60 (BP Location: Right Arm, Patient Position: Sitting, Cuff Size: Large)   Pulse 75   Temp 98.4 F (36.9 C) (Oral)   Resp 16   Ht 5\' 3"  (1.6 m)   Wt 194 lb 12.8 oz (88.4 kg)   SpO2 97%   BMI 34.51 kg/m        Objective:   Physical Exam Constitutional:      Appearance: She is well-developed.  Neck:     Thyroid: No thyromegaly.  Cardiovascular:     Rate and Rhythm: Normal rate and regular rhythm.     Heart sounds: Normal heart sounds. No murmur heard.   Pulmonary:     Effort: Pulmonary effort is normal. No respiratory distress.     Breath sounds: Wheezing (expiratory wheeze with cough. ) present.  Musculoskeletal:     Cervical back: Neck supple.  Skin:    General: Skin is warm and dry.  Neurological:     Mental Status: She is alert and oriented to person, place, and time.  Psychiatric:        Behavior: Behavior normal.        Thought Content: Thought content normal.  Judgment: Judgment normal.           Assessment & Plan:  Acute asthma exacerbation- will rx with prednisone taper (see order for details).  Also recommended the following:   Add zyrtec 10mg  once daily, continue singulair and symbicort.  Add proair every 6 hours for the next few days. May use delsym HS.    Iron deficiency anemia- check follow up cbc, iron levels. Continue iron 325mg  bid. Recommended that she add b12 1000 mcg PO daily as b12 was low normal. If fatigue persists despite normalization of Hgb, would consider repeat sleep study.  This visit occurred during the SARS-CoV-2 public health emergency.  Safety protocols were in place, including screening questions prior to the visit, additional usage of staff PPE, and extensive cleaning of exam room while observing appropriate contact time as indicated for disinfecting solutions.

## 2020-05-25 DIAGNOSIS — J4540 Moderate persistent asthma, uncomplicated: Secondary | ICD-10-CM | POA: Diagnosis not present

## 2020-05-28 ENCOUNTER — Other Ambulatory Visit: Payer: Self-pay | Admitting: Family

## 2020-06-09 ENCOUNTER — Encounter: Payer: Self-pay | Admitting: Family

## 2020-06-09 DIAGNOSIS — J4540 Moderate persistent asthma, uncomplicated: Secondary | ICD-10-CM | POA: Diagnosis not present

## 2020-06-09 DIAGNOSIS — M1712 Unilateral primary osteoarthritis, left knee: Secondary | ICD-10-CM | POA: Diagnosis not present

## 2020-06-19 ENCOUNTER — Emergency Department (HOSPITAL_COMMUNITY): Payer: Medicare PPO

## 2020-06-19 ENCOUNTER — Emergency Department (HOSPITAL_COMMUNITY)
Admission: EM | Admit: 2020-06-19 | Discharge: 2020-06-19 | Disposition: A | Discharge status: Home / Self Care | Payer: Medicare PPO | Attending: Emergency Medicine | Admitting: Emergency Medicine

## 2020-06-19 ENCOUNTER — Encounter (HOSPITAL_COMMUNITY): Payer: Self-pay | Admitting: Emergency Medicine

## 2020-06-19 DIAGNOSIS — Z79899 Other long term (current) drug therapy: Secondary | ICD-10-CM | POA: Insufficient documentation

## 2020-06-19 DIAGNOSIS — Z8719 Personal history of other diseases of the digestive system: Secondary | ICD-10-CM | POA: Diagnosis not present

## 2020-06-19 DIAGNOSIS — Z96641 Presence of right artificial hip joint: Secondary | ICD-10-CM | POA: Diagnosis not present

## 2020-06-19 DIAGNOSIS — J45909 Unspecified asthma, uncomplicated: Secondary | ICD-10-CM | POA: Insufficient documentation

## 2020-06-19 DIAGNOSIS — K2100 Gastro-esophageal reflux disease with esophagitis, without bleeding: Secondary | ICD-10-CM | POA: Diagnosis not present

## 2020-06-19 DIAGNOSIS — I10 Essential (primary) hypertension: Secondary | ICD-10-CM | POA: Diagnosis not present

## 2020-06-19 DIAGNOSIS — K449 Diaphragmatic hernia without obstruction or gangrene: Secondary | ICD-10-CM | POA: Diagnosis not present

## 2020-06-19 DIAGNOSIS — Z96653 Presence of artificial knee joint, bilateral: Secondary | ICD-10-CM | POA: Diagnosis not present

## 2020-06-19 DIAGNOSIS — Z7952 Long term (current) use of systemic steroids: Secondary | ICD-10-CM | POA: Diagnosis not present

## 2020-06-19 DIAGNOSIS — R079 Chest pain, unspecified: Secondary | ICD-10-CM | POA: Diagnosis not present

## 2020-06-19 DIAGNOSIS — R0789 Other chest pain: Secondary | ICD-10-CM | POA: Diagnosis not present

## 2020-06-19 LAB — CBC
HCT: 36.5 % (ref 36.0–46.0)
Hemoglobin: 11.2 g/dL — ABNORMAL LOW (ref 12.0–15.0)
MCH: 23.4 pg — ABNORMAL LOW (ref 26.0–34.0)
MCHC: 30.7 g/dL (ref 30.0–36.0)
MCV: 76.4 fL — ABNORMAL LOW (ref 80.0–100.0)
Platelets: 343 10*3/uL (ref 150–400)
RBC: 4.78 MIL/uL (ref 3.87–5.11)
RDW: 20.2 % — ABNORMAL HIGH (ref 11.5–15.5)
WBC: 7.1 10*3/uL (ref 4.0–10.5)
nRBC: 0 % (ref 0.0–0.2)

## 2020-06-19 LAB — BASIC METABOLIC PANEL
Anion gap: 8 (ref 5–15)
BUN: 7 mg/dL — ABNORMAL LOW (ref 8–23)
CO2: 24 mmol/L (ref 22–32)
Calcium: 9.1 mg/dL (ref 8.9–10.3)
Chloride: 107 mmol/L (ref 98–111)
Creatinine, Ser: 0.66 mg/dL (ref 0.44–1.00)
GFR, Estimated: >60 mL/min (ref >60)
Glucose, Bld: 112 mg/dL — ABNORMAL HIGH (ref 70–99)
Potassium: 3.6 mmol/L (ref 3.5–5.1)
Sodium: 139 mmol/L (ref 135–145)

## 2020-06-19 LAB — TROPONIN I (HIGH SENSITIVITY)
Troponin I (High Sensitivity): 3 ng/L (ref <18)
Troponin I (High Sensitivity): 4 ng/L (ref <18)

## 2020-06-19 MED ORDER — ASPIRIN 81 MG PO CHEW
324.0000 mg | CHEWABLE_TABLET | Freq: Once | ORAL | Status: AC
  Administered 2020-06-19: 324 mg via ORAL
  Filled 2020-06-19: qty 4

## 2020-06-19 MED ORDER — IOHEXOL 350 MG/ML SOLN
100.0000 mL | Freq: Once | INTRAVENOUS | Status: AC | PRN
  Administered 2020-06-19: 100 mL via INTRAVENOUS

## 2020-06-19 MED ORDER — NITROGLYCERIN 2 % TD OINT
1.0000 [in_us] | TOPICAL_OINTMENT | Freq: Four times a day (QID) | TRANSDERMAL | Status: DC
  Administered 2020-06-19: 1 [in_us] via TOPICAL
  Filled 2020-06-19: qty 1

## 2020-06-19 MED ORDER — SUCRALFATE 1 G PO TABS: 1.0000 g | ORAL_TABLET | Freq: Three times a day (TID) | ORAL | 0 refills | Status: DC

## 2020-06-19 MED ORDER — MORPHINE SULFATE (PF) 4 MG/ML IV SOLN
4.0000 mg | Freq: Once | INTRAVENOUS | Status: AC
  Administered 2020-06-19: 4 mg via INTRAVENOUS
  Filled 2020-06-19: qty 1

## 2020-06-19 MED ORDER — ALUM & MAG HYDROXIDE-SIMETH 200-200-20 MG/5ML PO SUSP
30.0000 mL | Freq: Once | ORAL | Status: AC
  Administered 2020-06-19: 30 mL via ORAL
  Filled 2020-06-19: qty 30

## 2020-06-19 MED ORDER — FAMOTIDINE IN NACL 20-0.9 MG/50ML-% IV SOLN
20.0000 mg | Freq: Once | INTRAVENOUS | Status: AC
  Administered 2020-06-19: 20 mg via INTRAVENOUS
  Filled 2020-06-19: qty 50

## 2020-06-19 NOTE — ED Provider Notes (Signed)
[facility] DEPART[facility] Provider Note   CSN: [id number] Arrival date & time: 06/19/20  1006     History Chief Complaint  Patient presents with  . Chest Pain    Khrystyne Henegar Brandy Dunlap is a 70 y.o. female.  HPI  HPI: A 70 year old patient with a history of hypertension and obesity presents for evaluation of chest pain. Initial onset of pain was approximately 1-3 hours ago. The patient's chest pain is described as heaviness/pressure/tightness and is not worse with exertion. The patient's chest pain is middle- or left-sided, is not well-localized, is not sharp and does radiate to the arms/jaw/neck. The patient does not complain of nausea and denies diaphoresis. The patient has no history of stroke, has no history of peripheral artery disease, has not smoked in the past 90 days, denies any history of treated diabetes, has no relevant family history of coronary artery disease (first degree relative at less than age 38) and has no history of hypercholesterolemia.  Patient does not have a history of heart disease.  She has had trouble with chest pain in the past and has had negative stress test.  She had some symptoms last evening but it resolved.  This morning the pain started again around 7 AM started and it persisted despite taking one of her Ultram's that she has for musculoskeletal pain. Past Medical History:  Diagnosis Date  . Allergy   . Asthma   . B12 deficiency   . Back pain   . Bronchitis    hx  . Colon polyp 10/06/2003  . GERD (gastroesophageal reflux disease)   . History of kidney stones    per 09-02-2018 pre-op eval , suspected kidney stone , to [facility] CT abd on 7-9- for further eval , passive  . Hypertension   . Low back pain   . Osteoarthritis   . Pneumonia   . Pre-diabetes   . Sinus trouble     Patient Active Problem List   Diagnosis Date Noted  . Spinal stenosis of lumbar region with neurogenic claudication 09/16/2019  . OA (osteoarthritis) of  hip 09/10/2018  . Atypical chest pain 04/18/2017  . Depression 10/01/2016  . Prediabetes 07/09/2016  . Class 1 obesity with serious comorbidity and body mass index (BMI) of 31.0 to 31.9 in adult 05/23/2015  . Morbid obesity ([place]) 04/25/2015  . Preventative health care 10/25/2014  . Asthma 10/08/2014  . S/P total knee arthroplasty 11/16/2013  . 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 Cough 06/26/2012  . B12 deficiency 09/21/2009  . Allergic rhinitis 06/29/2009  . SYMPTOMATIC MENOPAUSAL/FEMALE CLIMACTERIC STATES 09/06/2008  . Essential hypertension 10/08/2007  . OSTEOARTHRITIS 10/08/2007  . GERD 07/24/2007  . LOW BACK PAIN 07/24/2007    Past Surgical History:  Procedure Laterality Date  . CHOLECYSTECTOMY    . COLONOSCOPY    . COLONOSCOPY W/ BIOPSIES  10/06/2003  . LUMBAR FUSION  2008  . LUMBAR FUSION     2020  . PARTIAL HIP ARTHROPLASTY Right 2009    hip replacement  . TONSILLECTOMY    . TOTAL HIP ARTHROPLASTY Left 09/10/2018   Procedure: TOTAL HIP ARTHROPLASTY ANTERIOR APPROACH;  Surgeon: Gaynelle Arabian, MD;  Location: [facility];  Service: Orthopedics;  Laterality: Left;  . TOTAL KNEE ARTHROPLASTY Right 11/16/2013   Procedure: RIGHT TOTAL KNEE ARTHROPLASTY;  Surgeon: Vickey Huger, MD;  Location: [place];  Service: Orthopedics;  Laterality: Right;  [name] VIDEO BRONCHOSCOPY Bilateral 12/05/2012   Procedure: VIDEO BRONCHOSCOPY WITHOUT FLUORO;  Surgeon: Collene Gobble,  MD;  Location: [facility];  Service: Cardiopulmonary;  Laterality: Bilateral;     OB History    Gravida  1   Para  1   Term  1   Preterm      AB      Living  1     SAB      IAB      Ectopic      Multiple      Live Births              Family History  Problem Relation Age of Onset  . COPD Father        died at age 57  . Emphysema Father   . Heart Problems Father   . Heart disease Brother        stent with 90%  . COPD Mother   . Heart disease Mother   . Ovarian cancer Mother   . Emphysema Mother   .  Hypertension Mother   . Hyperlipidemia Mother   . Heart Problems Mother   . Stroke Mother   . Thyroid disease Mother   . Cancer Mother   . Breast cancer Maternal Aunt   . Colon cancer Neg Hx   . Esophageal cancer Neg Hx   . Rectal cancer Neg Hx   . Stomach cancer Neg Hx     Social History   Tobacco Use  . Smoking status: Never Smoker  . Smokeless tobacco: Never Used  Vaping Use  . Vaping Use: Never used  Substance Use Topics  . Alcohol use: No  . Drug use: No    Home Medications Prior to Admission medications   Medication Sig Start Date End Date Taking? Authorizing Provider  sucralfate (CARAFATE) 1 g tablet Take 1 tablet (1 g total) by mouth 4 (four) times daily -  with meals and at bedtime for 7 days. 06/19/20 06/26/20 Yes Dorie Rank, MD  traMADol (ULTRAM) 50 MG tablet Take 50-100 mg by mouth every 6 (six) hours as needed for pain. 05/10/20  Yes [provider]  Albuterol Sulfate (PROAIR RESPICLICK) 782 (90 Base) MCG/ACT AEPB 2 puffs every 6 hours as needed. 04/04/20   [name], NP  amLODipine (NORVASC) 5 MG tablet Take 1 tablet (5 mg total) by mouth daily. 05/30/20   [name], NP  amoxicillin-clavulanate (AUGMENTIN) 875-125 MG tablet Take 1 tablet by mouth 2 (two) times daily. 04/04/20   [name], NP  budesonide-formoterol [facility]) 160-4.5 MCG/ACT inhaler Inhale 2 puffs into the lungs 2 (two) times daily.    [provider]  cetirizine (ZYRTEC) 10 MG tablet Take 1 tablet (10 mg total) by mouth daily. 05/16/20   [name], NP  Cholecalciferol (VITAMIN D3) 75 MCG (3000 UT) TABS Take 1 tablet by mouth once daily. 09/05/18   [name], NP  dexlansoprazole (DEXILANT) 60 MG capsule Take 1 capsule (60 mg total) by mouth daily. 09/03/19   [name], NP  diclofenac sodium (VOLTAREN) 1 % GEL Apply 2 g topically 4 (four) times daily as needed. 01/02/19   [name], NP  Ferrous Sulfate (IRON) 325 (65 Fe) MG  TABS Take 1 tablet (325 mg total) by mouth in the morning and at bedtime. 05/16/20   [name], NP  montelukast (SINGULAIR) 10 MG tablet Take 10 mg by mouth at bedtime.    [provider]  omalizumab Arvid Right) 150 MG injection Inject 150 mg into the skin every 14 (fourteen) days.    [provider]  predniSONE (DELTASONE) 10 MG tablet 4 tabs by mouth once daily for 2 days, then 3 tabs daily x 2 days, then 2 tabs daily x 2 days, then 1 tab daily x 2 days 05/16/20   [name], NP  vitamin B-12 (CYANOCOBALAMIN) 1000 MCG tablet Take 1 tablet (1,000 mcg total) by mouth daily. 05/16/20   [name], NP    Allergies    Pantoprazole sodium  Review of Systems   Review of Systems  All other systems reviewed and are negative.   Physical Exam Updated Vital Signs BP (!) 143/62   Pulse 70   Temp 98.4 F (36.9 C) (Oral)   Resp 10   SpO2 95%   Physical Exam Vitals and nursing note reviewed.  Constitutional:      General: She is not in acute distress.    Appearance: She is well-developed.  HENT:     Head: Normocephalic and atraumatic.     Right Ear: External ear normal.     Left Ear: External ear normal.  Eyes:     General: No scleral icterus.       Right eye: No discharge.        Left eye: No discharge.     Conjunctiva/sclera: Conjunctivae normal.  Neck:     Trachea: No tracheal deviation.  Cardiovascular:     Rate and Rhythm: Normal rate and regular rhythm.  Pulmonary:     Effort: Pulmonary effort is normal. No respiratory distress.     Breath sounds: Normal breath sounds. No stridor. No wheezing or rales.  Chest:     Chest wall: No deformity or tenderness.  Abdominal:     General: Bowel sounds are normal. There is no distension.     Palpations: Abdomen is soft.     Tenderness: There is no abdominal tenderness. There is no guarding or rebound.  Musculoskeletal:        General: No tenderness.     Cervical back: Neck supple.  Skin:     General: Skin is warm and dry.     Findings: No rash.  Neurological:     Mental Status: She is alert.     Cranial Nerves: No cranial nerve deficit (no facial droop, extraocular movements intact, no slurred speech).     Sensory: No sensory deficit.     Motor: No abnormal muscle tone or seizure activity.     Coordination: Coordination normal.     ED Results / Procedures / Treatments   Labs (all labs ordered are listed, but only abnormal results are displayed) Labs Reviewed  BASIC METABOLIC PANEL - Abnormal; Notable for the following components:      Result Value   Glucose, Bld 112 (*)    BUN 7 (*)    All other components within normal limits  CBC - Abnormal; Notable for the following components:   Hemoglobin 11.2 (*)    MCV 76.4 (*)    MCH 23.4 (*)    RDW 20.2 (*)    All other components within normal limits  TROPONIN I (HIGH SENSITIVITY)  TROPONIN I (HIGH SENSITIVITY)    EKG EKG Interpretation  Date/Time:  Sunday June 19 2020 10:17:36 EDT Ventricular Rate:  80 PR Interval:  144 QRS Duration: 88 QT Interval:  390 QTC Calculation: 449 R Axis:   -28 Text Interpretation: Normal sinus rhythm Low voltage QRS Nonspecific ST abnormality Abnormal ECG No significant change since last tracing Confirmed by Dorie Rank [id number]) on 06/19/2020 10:29:04 AM   Radiology  DG Chest 2 View  Result Date: 06/19/2020 CLINICAL DATA:  Left chest pain EXAM: CHEST - 2 VIEW COMPARISON:  09/02/2018 FINDINGS: The heart size and mediastinal contours are within normal limits. Atherosclerotic calcification of the aortic knob. No focal airspace consolidation, pleural effusion, or pneumothorax. The visualized skeletal structures are unremarkable. IMPRESSION: No active cardiopulmonary disease. Electronically Signed   By: Davina Poke D.O.   On: 06/19/2020 11:12   CT ANGIO CHEST AORTA W/CM & OR WO/CM  Result Date: 06/19/2020 CLINICAL DATA:  Chest/back pain EXAM: CT ANGIOGRAPHY CHEST WITH CONTRAST  TECHNIQUE: Multidetector CT imaging of the chest was performed using the standard protocol during bolus administration of intravenous contrast. Multiplanar CT image reconstructions and MIPs were obtained to evaluate the vascular anatomy. CONTRAST:  122mL OMNIPAQUE IOHEXOL 350 MG/ML SOLN COMPARISON:  CT abdomen 09/04/2018 FINDINGS: Cardiovascular: Heart size normal. No pericardial effusion. Satisfactory opacification of pulmonary arteries noted, and there is no evidence of pulmonary emboli. Adequate contrast opacification of the thoracic aorta with no evidence of dissection, aneurysm, or stenosis. There is classic 3-vessel brachiocephalic arch anatomy without proximal stenosis. Visualized proximal abdominal aorta nondilated. Scattered calcified aortic plaque. Mediastinum/Nodes: Small hiatal hernia.  No mass or adenopathy. Lungs/Pleura: No pleural effusion. No pneumothorax. Lungs are clear. Upper Abdomen: Cholecystectomy clips.  No acute findings. Musculoskeletal: No chest wall abnormality. No acute or significant osseous findings. Review of the MIP images confirms the above findings. IMPRESSION: 1. Negative for acute PE or thoracic aortic dissection. 2. Small hiatal hernia. Aortic Atherosclerosis (ICD10-I70.0). Electronically Signed   By: Lucrezia Europe M.D.   On: 06/19/2020 14:45    Procedures Procedures   Medications Ordered in ED Medications  nitroGLYCERIN (NITROGLYN) 2 % ointment 1 inch (1 inch Topical Given 06/19/20 1235)  aspirin chewable tablet 324 mg (324 mg Oral Given 06/19/20 1112)  morphine 4 MG/ML injection 4 mg (4 mg Intravenous Given 06/19/20 1115)  alum & mag hydroxide-simeth (MAALOX/MYLANTA) 200-200-20 MG/5ML suspension 30 mL (30 mLs Oral Given 06/19/20 1114)  famotidine (PEPCID) IVPB 20 mg premix (0 mg Intravenous Stopped 06/19/20 1404)  morphine 4 MG/ML injection 4 mg (4 mg Intravenous Given 06/19/20 1456)  iohexol (OMNIPAQUE) 350 MG/ML injection 100 mL (100 mLs Intravenous Contrast Given  06/19/20 1416)    ED Course  I have reviewed the triage vital signs and the nursing notes.  Pertinent labs & imaging results that were available during my care of the patient were reviewed by me and considered in my medical decision making (see chart for details).  Clinical Course as of 06/19/20 1525  Sun Jun 19, 2020  1154 Ischial troponin is normal [JK]  1227 Patient still having some chest pressure will try additional medications. [NI]  6270 Patient still having chest pain.  Delta troponin normal.  We will proceed with CT angio to make sure there is no signs of embolism or aneurysm.  It is possible symptoms may be related to gastroesophageal reflux [JK]  1448 CT angio negative for PE or dissection [JK]    Clinical Course User Index [JK] Dorie Rank, MD   MDM Rules/Calculators/A&P HEAR Score: 5                        Patient presented to the ED for evaluation of chest pain.  Symptoms concerning for the possibility of ACS.  Patient does have cardiac risk factors however has had negative stress testing in the past.  Patient's initial EKG and troponin were normal.  Her delta troponin was also normal.  Arguing against ACS at this point.  With her persistent pain did proceed with chest CT to rule out any acute aortic dissection or pulmonary embolism and this was also negative.  Patient does have a history of gastroesophageal reflux.  At this point I suspect that is most likely the cause of her symptoms.  We will have her start taking Carafate in addition to her Dexilant.  Recommend close follow-up with her doctor for recheck.  Return to the ER for worsening symptoms. Final Clinical Impression(s) / ED Diagnoses Final diagnoses:  Chest pain, unspecified type  Gastroesophageal reflux disease with esophagitis without hemorrhage    Rx / DC Orders ED Discharge Orders         Ordered    sucralfate (CARAFATE) 1 g tablet  3 times daily with meals & bedtime        06/19/20 1525            Dorie Rank, MD 06/19/20 1527

## 2020-06-19 NOTE — ED Notes (Signed)
Nitro paste removed at this time, as pt reports worsening chest pain/pressure with paste, Dr Tomi Bamberger ok'd

## 2020-06-19 NOTE — ED Notes (Signed)
Patient transported to CT 

## 2020-06-19 NOTE — Discharge Instructions (Signed)
Start taking the medication in addition to your antacids.  Follow-up with your doctor to be rechecked.  Return as needed for worsening symptoms.

## 2020-06-19 NOTE — ED Triage Notes (Signed)
Pt reports L sided chest pain that radiates to L shoulder blade since yesterday with lightheadedness.  Denies SOB, nausea, and vomiting.

## 2020-06-21 DIAGNOSIS — M1712 Unilateral primary osteoarthritis, left knee: Secondary | ICD-10-CM | POA: Diagnosis not present

## 2020-06-28 DIAGNOSIS — J4540 Moderate persistent asthma, uncomplicated: Secondary | ICD-10-CM | POA: Diagnosis not present

## 2020-07-04 ENCOUNTER — Other Ambulatory Visit: Payer: Self-pay | Admitting: Family

## 2020-07-04 ENCOUNTER — Ambulatory Visit: Payer: Medicare PPO | Admitting: Physician Assistant

## 2020-07-06 ENCOUNTER — Encounter: Payer: Self-pay | Admitting: Nurse Practitioner

## 2020-07-06 ENCOUNTER — Other Ambulatory Visit (INDEPENDENT_AMBULATORY_CARE_PROVIDER_SITE_OTHER): Payer: Medicare PPO

## 2020-07-06 ENCOUNTER — Ambulatory Visit: Payer: Medicare PPO | Admitting: Nurse Practitioner

## 2020-07-06 VITALS — BP 142/64 | HR 70 | Ht 63.0 in | Wt 193.0 lb

## 2020-07-06 DIAGNOSIS — R0789 Other chest pain: Secondary | ICD-10-CM | POA: Diagnosis not present

## 2020-07-06 DIAGNOSIS — E538 Deficiency of other specified B group vitamins: Secondary | ICD-10-CM

## 2020-07-06 DIAGNOSIS — K219 Gastro-esophageal reflux disease without esophagitis: Secondary | ICD-10-CM

## 2020-07-06 DIAGNOSIS — D509 Iron deficiency anemia, unspecified: Secondary | ICD-10-CM | POA: Insufficient documentation

## 2020-07-06 LAB — CBC
HCT: 34.1 % — ABNORMAL LOW (ref 36.0–46.0)
Hemoglobin: 11 g/dL — ABNORMAL LOW (ref 12.0–15.0)
MCHC: 32.2 g/dL (ref 30.0–36.0)
MCV: 73 fl — ABNORMAL LOW (ref 78.0–100.0)
Platelets: 340 10*3/uL (ref 150.0–400.0)
RBC: 4.67 Mil/uL (ref 3.87–5.11)
RDW: 20.4 % — ABNORMAL HIGH (ref 11.5–15.5)
WBC: 7 10*3/uL (ref 4.0–10.5)

## 2020-07-06 LAB — COMPREHENSIVE METABOLIC PANEL
ALT: 27 U/L (ref 0–35)
AST: 31 U/L (ref 0–37)
Albumin: 4.2 g/dL (ref 3.5–5.2)
Alkaline Phosphatase: 70 U/L (ref 39–117)
BUN: 10 mg/dL (ref 6–23)
CO2: 25 mEq/L (ref 19–32)
Calcium: 9.4 mg/dL (ref 8.4–10.5)
Chloride: 105 mEq/L (ref 96–112)
Creatinine, Ser: 0.68 mg/dL (ref 0.40–1.20)
GFR: 88.7 mL/min (ref >60.00)
Glucose, Bld: 99 mg/dL (ref 70–99)
Potassium: 3.7 mEq/L (ref 3.5–5.1)
Sodium: 139 mEq/L (ref 135–145)
Total Bilirubin: 0.4 mg/dL (ref 0.2–1.2)
Total Protein: 7.1 g/dL (ref 6.0–8.3)

## 2020-07-06 LAB — B12 AND FOLATE PANEL
Folate: 8.8 ng/mL (ref >5.9)
Vitamin B-12: 257 pg/mL (ref 211–911)

## 2020-07-06 NOTE — Patient Instructions (Addendum)
If you are age 70 or older, your body mass index should be between 23-30. Your Body mass index is 34.19 kg/m. If this is out of the aforementioned range listed, please consider follow up with your Primary Care Provider.  LABS:  Lab work has been ordered for you today. Our lab is located in the basement. Press "B" on the elevator. The lab is located at the first door on the left as you exit the elevator.  HEALTHCARE LAWS AND MY CHART RESULTS: Due to recent changes in healthcare laws, you may see the results of your imaging and laboratory studies on MyChart before your provider has had a chance to review them.   We understand that in some cases there may be results that are confusing or concerning to you. Not all laboratory results come back in the same time frame and the provider may be waiting for multiple results in order to interpret others.  Please give [name] 48 hours in order for your provider to thoroughly review all the results before contacting the office for clarification of your results.   Please follow up with your Cardiologist prior to scheduling EGD/Colonoscopy.  It was great seeing you today! Thank you for entrusting me with your care and choosing [facility].  Noralyn Pick, CRNP

## 2020-07-06 NOTE — Progress Notes (Addendum)
07/06/2020 [place] [id number] 1950-05-03   Chief Complaint: Reflux, left chest pain   History of Present Illness:  Brandy Dunlap. Brandy Dunlap is a 70 year old female with a past medical history of asthma, hypertension, kidney stone, iron deficiency anemia, B12 deficiency, GERD and a hyperplastic colon polyp in 2018. 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  She presents to our office today for further evaluation regarding possible reflux symptoms.  She awakened on Saturday, 06/18/2020 with left chest pressure type pain which radiated through to the shoulder blade associated with left arm heaviness.  No associated palpitations, dizziness or shortness of breath.  Her chest pain diminished and she went to bed.  She awakened the next day with worsening left chest pressure again radiating to the left shoulder with associated left arm heaviness.  A twelve-lead EKG showed a normal sinus rhythm with nonspecific ST abnormality which was unchanged from her prior EKG.  No acute ischemia was identified.  She received morphine 4 mg IV without improvement.  Nitroglycerin 2% ointment/patch was applied and she stated her chest pain significantly worsened after the nitroglycerin patch was placed so it was removed.  A chest CT angiogram negative for PE or aortic dissection.  Aortic atherosclerosis was noted.  A small hiatal hernia was present.  Troponin levels were normal.  BMP was normal.  LFTs were not done.  WBC 7.1.  Hemoglobin 11.2.  MCV 76.4.  Platelet 343.  GERD was suspected for the etiology of her chest pain and she was prescribed Carafate in addition to [place] and she was discharged home.  She reported undergoing a negative stress test 1 or 2 years ago by Dr. Agustin Cree due to having atypical chest pain.  She stated her recent chest pain was unlike the chest pain she had in 2019.  Echo stress test in 2019 was negative.  She presents to  our office for further evaluation for GERD. She denies having any further left chest, shoulder or arm pain/pressure since her ED visit.  She reports taking Dexilant 60 mg daily for 5+ years.  She denies ever having any heartburn.  She has infrequent episodes of dysphagia which typically occur when drinking cold liquids which occurs once every 2 to 3 weeks.  During these episodes, she feels as if the liquid gets stuck to the mid esophagus which results in coughing then the liquid passes down to the stomach.  No NSAID use.  She is passing a normal formed brown bowel movement daily.  She has loose stools if she has a lot of sinus drainage.  No watery diarrhea or rectal bleeding.  She stated her daughter has Crohn's disease.  No known family history of esophageal, gastric or colon cancer.   She has a history of anemia dating back to at least 08/2018 following her left hip replacement surgery. Her Hg level ws 12.7 prior to her hip surgery and has remained around 10 - 11 since then.  Labs done 04/04/2020 showed Hg level of 10.5. HCT 32.5.  Iron 23.  Ferritin 6.3.  Vitamin B12 227.  He stated taking iron supplement for 30 days and repeat laboratory studies 1 month later continue to show low iron level of 24.  Her most recent colonoscopy by Dr. Carlean Purl was 03/23/2016 which identified a hyperplastic polyp which was removed from the colon and diverticulosis was present to the sigmoid and descending colon.  She was advised to repeat a  colonoscopy in 10 years.  She underwent an EGD 12/07/1999 which showed a hiatal hernia.  She had an asthma flare 04/2020 for which she was prescribed a prednisone taper and Pro air as needed. She remains on Zyrtec, Singulair and Symbicort.  She stated her asthma is better controlled at this time.  CBC Latest Ref Rng & Units 06/19/2020 05/16/2020 04/04/2020  WBC 4.0 - 10.5 K/uL 7.1 6.0 5.2  Hemoglobin 12.0 - 15.0 g/dL 11.2(L) 10.8(L) 10.5(L)  Hematocrit 36.0 - 46.0 % 36.5 33.2(L) 32.5(L)   Platelets 150 - 400 K/uL 343 369.0 389.0    CMP Latest Ref Rng & Units 06/19/2020 04/04/2020 09/17/2019  Glucose 70 - 99 mg/dL 112(H) 100(H) 121(H)  BUN 8 - 23 mg/dL 7(L) 8 9  Creatinine 0.44 - 1.00 mg/dL 0.66 0.73 0.86  Sodium 135 - 145 mmol/L 139 138 138  Potassium 3.5 - 5.1 mmol/L 3.6 4.5 4.4  Chloride 98 - 111 mmol/L 107 105 106  CO2 22 - 32 mmol/L 24 26 25   Calcium 8.9 - 10.3 mg/dL 9.1 9.6 8.8(L)  Total Protein 6.0 - 8.3 g/dL - - -  Total Bilirubin 0.2 - 1.2 mg/dL - - -  Alkaline Phos 39 - 117 U/L - - -  AST 0 - 37 U/L - - -  ALT 0 - 35 U/L - - -   Colonoscopy 03/23/2016:  - One 4 mm polyp in the descending colon, removed with a cold snare. Resected and retrieved. - Diverticulosis in the sigmoid colon and in the descending colon. - The examination was otherwise normal on direct and retroflexion views. - 10 year recall Biopsy result: HYPERPLASTIC POLYP (1). NO DYSPLASIA OR MALIGNANCY IDENTIFIED  ECHO 08/08/2018:  1. The left ventricle has normal systolic function with an ejection  fraction of 60-65%. The cavity size was normal. There is mild concentric  left ventricular hypertrophy. Left ventricular diastolic parameters were  normal. GLS - 20.8%   2. The right ventricle has normal systolic function. The cavity was  normal. There is no increase in right ventricular wall thickness.   3. The mitral valve is degenerative. There is mild mitral annular  calcification present. No evidence of mitral valve stenosis.   4. The aortic valve is tricuspid. No stenosis of the aortic valve.   5. The aortic root, ascending aorta and aortic arch are normal in size  and structure.  Past Medical History:  Diagnosis Date   Allergy    Asthma    B12 deficiency    Back pain    Bronchitis    hx   Colon polyp 10/06/2003   GERD (gastroesophageal reflux disease)    History of kidney stones    per 09-02-2018 pre-op eval , suspected kidney stone , to [facility] abd on 7-9- for further eval ,  passive   Hypertension    Low back pain    Osteoarthritis    Pneumonia    Pre-diabetes    Sinus trouble    Past Surgical History:  Procedure Laterality Date   CHOLECYSTECTOMY     COLONOSCOPY     COLONOSCOPY W/ BIOPSIES  10/06/2003   LUMBAR FUSION  2008   LUMBAR FUSION     2020   PARTIAL HIP ARTHROPLASTY Right 2009    hip replacement   TONSILLECTOMY     TOTAL HIP ARTHROPLASTY Left 09/10/2018   Procedure: TOTAL HIP ARTHROPLASTY ANTERIOR APPROACH;  Surgeon: Gaynelle Arabian, MD;  Location: [facility];  Service: Orthopedics;  Laterality: Left;  TOTAL KNEE ARTHROPLASTY Right 11/16/2013   Procedure: RIGHT TOTAL KNEE ARTHROPLASTY;  Surgeon: Vickey Huger, MD;  Location: [facility];  Service: Orthopedics;  Laterality: Right;   VIDEO BRONCHOSCOPY Bilateral 12/05/2012   Procedure: VIDEO BRONCHOSCOPY WITHOUT FLUORO;  Surgeon: Collene Gobble, MD;  Location: [facility];  Service: Cardiopulmonary;  Laterality: Bilateral;    Social History: No alcohol or drug use.   Family History: Mother deceased ae 24 lung and heart disease. Father deceased MI.  No family history esophageal, gastric or colon cancer.   Current Outpatient Medications on File Prior to Visit  Medication Sig Dispense Refill   Albuterol Sulfate (PROAIR RESPICLICK) 952 (90 Base) MCG/ACT AEPB 2 puffs every 6 hours as needed.     amLODipine (NORVASC) 5 MG tablet Take 1 tablet (5 mg total) by mouth daily. 30 tablet 1   budesonide-formoterol (SYMBICORT) 160-4.5 MCG/ACT inhaler Inhale 2 puffs into the lungs 2 (two) times daily.     Cholecalciferol (VITAMIN D3) 75 MCG (3000 UT) TABS Take 1 tablet by mouth once daily. 90 tablet 1   dexlansoprazole (DEXILANT) 60 MG capsule Take 1 capsule (60 mg total) by mouth daily. 90 capsule 3   diclofenac sodium (VOLTAREN) 1 % GEL Apply 2 g topically 4 (four) times daily as needed. 100 g 3   montelukast (SINGULAIR) 10 MG tablet Take 10 mg by mouth at bedtime.     omalizumab (XOLAIR) 150 MG injection Inject 150  mg into the skin every 14 (fourteen) days.     traMADol (ULTRAM) 50 MG tablet Take 50-100 mg by mouth every 6 (six) hours as needed for pain.     No current facility-administered medications on file prior to visit.   Allergies  Allergen Reactions   Pantoprazole Sodium Palpitations   Review of Systems:   Constitutional: Negative for fever, sweats, chills or weight loss.  Respiratory: Negative for shortness of breath.   Cardiovascular: See HPI.  Gastrointestinal: See HPI.  Musculoskeletal: + Left knee pain.  Neurological: Negative for dizziness, headaches or paresthesias.    Physical Exam: Ht 5\' 3"  (1.6 m)   Wt 193 lb (87.5 kg)   BMI 34.19 kg/m   BP (!) 142/64   Pulse 70   Ht 5\' 3"  (1.6 m)   Wt 193 lb (87.5 kg)   BMI 34.19 kg/m   General: 70 year old female in no acute distress. Head: Normocephalic and atraumatic. Eyes: No scleral icterus. Conjunctiva pink . Ears: Normal auditory acuity. Mouth: Dentition intact. No ulcers or lesions. Questionable early thrush. Lungs: Clear throughout to auscultation. Heart: Regular rate and rhythm, no murmur. Abdomen: Soft, nontender and nondistended. No masses or hepatomegaly. Normal bowel sounds x 4 quadrants.  Rectal: Deferred.  Musculoskeletal: Symmetrical with no gross deformities. Extremities: No edema. Neurological: Alert oriented x 4. No focal deficits.  Psychological: Alert and cooperative. Normal mood and affect  Assessment and Recommendations:  86. 70 year old female with a past history of GERD on chronic PPI. Hiatal hernia. Dysphagia with cold liquids, suspect esophageal spasms. Atypical left chest pain/pressure which radiated to the left shoulder with left arm heaviness on 4/24 and 4/25. ED evaluation showed normal troponin levels, EKG without acute ischemia and chest CTA was unrevealing. No further CP since her ED evaluation.  -Continue Dexilant for now -Avoid cold liquids -Cardiac clearance requested prior to scheduling  the EGD  2.  Iron deficiency anemia.  Vitamin B12 deficiency.  No overt GI bleeding. Iron levels did not improve on Ferrous Sulfate  325mg  po QD x 4 weeks.  -CBC, iron, iron saturation, TIBC, ferritin, B12, TTG and IgA - EGD to rule out Cameron lesions, GERD, atrophic gastritis and UGI malignancy. EGD benefits and risks discussed including risk with sedation, risk of bleeding, perforation and infection.  EGD to be scheduled after cardiac clearance as noted above -Repeat colonoscopy if EGD unrevealingI discussed scheduling a colonoscopy at the time of her EGD, to further discuss with Dr. Carlean Purl.   3. History of a hyperplastic colon polyps per colonoscopy 02/2016 -Next colonoscopy due 02/2026 -See plan in # 2  4.  Asthma, currently stable  Attending I agree w/ the evaluation as above.  I do think that she should also have a colonoscopy with the EGD given length of time since last colonoscopy. I am suspicious of a peri-operative anemia leading to iron-deficiency but that needs to be proven I.e. exclude GI blood loss.  Gatha Mayer, MD, [facility] 07/11/2020 4:06 PM  ADDENDUM: Cardiac Lexi scan done 07/18/2020 was negative for ischemia as documented by cardiologist Dr. Agustin Cree on 07/19/2020.

## 2020-07-07 ENCOUNTER — Other Ambulatory Visit: Payer: Self-pay

## 2020-07-07 ENCOUNTER — Telehealth: Payer: Self-pay | Admitting: Cardiology

## 2020-07-07 DIAGNOSIS — R7303 Prediabetes: Secondary | ICD-10-CM | POA: Insufficient documentation

## 2020-07-07 DIAGNOSIS — J4540 Moderate persistent asthma, uncomplicated: Secondary | ICD-10-CM | POA: Diagnosis not present

## 2020-07-07 DIAGNOSIS — H1045 Other chronic allergic conjunctivitis: Secondary | ICD-10-CM | POA: Insufficient documentation

## 2020-07-07 DIAGNOSIS — T7840XA Allergy, unspecified, initial encounter: Secondary | ICD-10-CM | POA: Insufficient documentation

## 2020-07-07 DIAGNOSIS — J189 Pneumonia, unspecified organism: Secondary | ICD-10-CM | POA: Insufficient documentation

## 2020-07-07 DIAGNOSIS — Z87442 Personal history of urinary calculi: Secondary | ICD-10-CM | POA: Insufficient documentation

## 2020-07-07 DIAGNOSIS — J3081 Allergic rhinitis due to animal (cat) (dog) hair and dander: Secondary | ICD-10-CM | POA: Diagnosis not present

## 2020-07-07 DIAGNOSIS — J40 Bronchitis, not specified as acute or chronic: Secondary | ICD-10-CM | POA: Insufficient documentation

## 2020-07-07 DIAGNOSIS — J209 Acute bronchitis, unspecified: Secondary | ICD-10-CM | POA: Insufficient documentation

## 2020-07-07 DIAGNOSIS — J349 Unspecified disorder of nose and nasal sinuses: Secondary | ICD-10-CM | POA: Insufficient documentation

## 2020-07-07 DIAGNOSIS — J301 Allergic rhinitis due to pollen: Secondary | ICD-10-CM | POA: Diagnosis not present

## 2020-07-07 DIAGNOSIS — J3089 Other allergic rhinitis: Secondary | ICD-10-CM | POA: Diagnosis not present

## 2020-07-07 HISTORY — DX: Allergic rhinitis due to animal (cat) (dog) hair and dander: J30.81

## 2020-07-07 HISTORY — DX: Other chronic allergic conjunctivitis: H10.45

## 2020-07-07 LAB — TISSUE TRANSGLUTAMINASE ABS,IGG,IGA
(tTG) Ab, IgA: <1 U/mL
(tTG) Ab, IgG: <1 U/mL

## 2020-07-07 LAB — IRON,TIBC AND FERRITIN PANEL
%SAT: 6 % (calc) — ABNORMAL LOW (ref 16–45)
Ferritin: 9 ng/mL — ABNORMAL LOW (ref 16–288)
Iron: 39 ug/dL — ABNORMAL LOW (ref 45–160)
TIBC: 626 mcg/dL (calc) — ABNORMAL HIGH (ref 250–450)

## 2020-07-07 LAB — IGA: Immunoglobulin A: 231 mg/dL (ref 70–320)

## 2020-07-07 NOTE — Telephone Encounter (Signed)
Called patient scheduled her for appointment next week.

## 2020-07-07 NOTE — Telephone Encounter (Signed)
    Pt is calling to make an appt with Dr. Raliegh Ip. She said her esophagus doctor wants her to see Dr. Raliegh Ip next week to be cleared for procedure. Advise to available appt until June in [place] and pt ask to message nurse. She is it is urgent to see Dr. Raliegh Ip

## 2020-07-12 ENCOUNTER — Ambulatory Visit: Payer: Medicare PPO | Admitting: Cardiology

## 2020-07-12 ENCOUNTER — Other Ambulatory Visit: Payer: Self-pay

## 2020-07-12 VITALS — BP 140/62 | HR 75 | Ht 63.0 in | Wt 193.0 lb

## 2020-07-12 DIAGNOSIS — I10 Essential (primary) hypertension: Secondary | ICD-10-CM

## 2020-07-12 DIAGNOSIS — R079 Chest pain, unspecified: Secondary | ICD-10-CM | POA: Diagnosis not present

## 2020-07-12 DIAGNOSIS — R0789 Other chest pain: Secondary | ICD-10-CM

## 2020-07-12 DIAGNOSIS — J4540 Moderate persistent asthma, uncomplicated: Secondary | ICD-10-CM | POA: Diagnosis not present

## 2020-07-12 DIAGNOSIS — R7303 Prediabetes: Secondary | ICD-10-CM

## 2020-07-12 NOTE — Progress Notes (Signed)
kg

## 2020-07-12 NOTE — Progress Notes (Signed)
Cardiology Office Note:    Date:  07/12/2020   ID:  Brandy Dunlap, DOB [birth year], MRN [id number]  PCP:  [name], NP  Cardiologist:  Jenne Campus, MD    Referring MD: [name], NP   Chief Complaint  Patient presents with  . Chest Pain    4/24, was seen at ER    History of Present Illness:    Brandy Dunlap is a 70 y.o. female with past medical history significant for essential hypertension, atypical chest pain.  She did have a stress test done couple years ago which was negative.  Also dyslipidemia.  She was in the emergency room about 3 weeks ago secondary to chest pain.  Apparently pain was quite severe she graded as 10 in scale up to 10 to troponin I high-sensitivity were negative she did have a CT angio of her chest done to look for PE as well as dissection which was negative.  Eventually she ended up leaving emergency room after hours of being better with still some residual chest pain she graded this as 2 in scale up to 10.   For about next few days and now she is pain-free.  She did see her GI specialist which recommended to come to see [name] for evaluation. Denies having exertional chest pain tightness squeezing pressure burning chest there is some fatigue tiredness no more chest pain.  Past Medical History:  Diagnosis Date  . Acute pain of left knee 10/02/2019  . Allergic rhinitis 06/29/2009   Qualifier: Diagnosis of  By: Joyce Gross    . Allergic rhinitis due to animal (cat) (dog) hair and dander 07/07/2020  . Allergy   . Asthma   . Atypical chest pain 04/18/2017  . B12 deficiency   . Back pain   . Body mass index (BMI) 35.0-35.9, adult 05/01/2019  . Bronchitis    hx  . 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  . Chronic neck pain 03/01/2015  . Colon polyp 10/06/2003  . Depressive disorder 10/01/2016  . 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 Essential hypertension 10/08/2007   Qualifier: Diagnosis of  By: Arnoldo Morale MD, Balinda Quails   . GERD  (gastroesophageal reflux disease)   . History of kidney stones    per 09-02-2018 pre-op eval , suspected kidney stone , to [facility] abd on 7-9- for further eval , passive  . Hypertension   . Infectious disease 05/23/2015  . Low back pain   . Osteoarthritis   . Pneumonia   . Pre-diabetes   . Prediabetes 07/09/2016  . Referred otalgia of left ear 03/26/2016  . Sinus trouble     Past Surgical History:  Procedure Laterality Date  . CHOLECYSTECTOMY    . COLONOSCOPY    . COLONOSCOPY W/ BIOPSIES  10/06/2003  . LUMBAR FUSION  2008  . LUMBAR FUSION     2020  . PARTIAL HIP ARTHROPLASTY Right 2009    hip replacement  . TONSILLECTOMY    . TOTAL HIP ARTHROPLASTY Left 09/10/2018   Procedure: TOTAL HIP ARTHROPLASTY ANTERIOR APPROACH;  Surgeon: Gaynelle Arabian, MD;  Location: [facility];  Service: Orthopedics;  Laterality: Left;  . TOTAL KNEE ARTHROPLASTY Right 11/16/2013   Procedure: RIGHT TOTAL KNEE ARTHROPLASTY;  Surgeon: Vickey Huger, MD;  Location: [facility];  Service: Orthopedics;  Laterality: Right;  [name] VIDEO BRONCHOSCOPY Bilateral 12/05/2012   Procedure: VIDEO BRONCHOSCOPY WITHOUT FLUORO;  Surgeon: Collene Gobble, MD;  [facility]: [facility];  Service: Cardiopulmonary;  Laterality: Bilateral;    Current  Medications: Current Meds  Medication Sig  . Albuterol Sulfate (PROAIR RESPICLICK) 998 (90 Base) MCG/ACT AEPB 2 puffs every 6 hours as needed. (Patient taking differently: Inhale 2 puffs into the lungs as needed (Wheezing or SOB). 2 puffs every 6 hours as needed.)  . amLODipine (NORVASC) 5 MG tablet Take 1 tablet (5 mg total) by mouth daily.  . budesonide-formoterol (SYMBICORT) 160-4.5 MCG/ACT inhaler Inhale 2 puffs into the lungs 2 (two) times daily.  . cefdinir (OMNICEF) 300 MG capsule Take 1 capsule by mouth in the morning and at bedtime.  . Cholecalciferol (VITAMIN D3) 75 MCG (3000 UT) TABS Take 1 tablet by mouth once daily. (Patient taking differently: Take 3,000 Units by mouth daily. Take 1  tablet by mouth once daily.)  . dexlansoprazole (DEXILANT) 60 MG capsule Take 1 capsule (60 mg total) by mouth daily.  . diclofenac sodium (VOLTAREN) 1 % GEL Apply 2 g topically 4 (four) times daily as needed. (Patient taking differently: Apply 2 g topically 4 (four) times daily as needed (L knee pain).)  . EPINEPHrine 0.3 mg/0.3 mL IJ SOAJ injection Inject 0.3 mg into the muscle as needed for anaphylaxis.  [name] ketotifen (ZADITOR) 0.025 % ophthalmic solution Place 1 drop into both eyes 2 (two) times daily as needed (Eye dryness).  [name] omalizumab (XOLAIR) 150 MG injection Inject 150 mg into the skin every 14 (fourteen) days.  . traMADol (ULTRAM) 50 MG tablet Take 50-100 mg by mouth every 6 (six) hours as needed for pain.  . [DISCONTINUED] montelukast (SINGULAIR) 10 MG tablet Take 10 mg by mouth at bedtime.     Allergies:   Pantoprazole sodium   Social History   Socioeconomic History  . Marital status: Single    Spouse name: Not on file  . Number of children: 1  . Years of education: Not on file  . Highest education level: Not on file  Occupational History  . Occupation: Product manager: [employer]  Tobacco Use  . Smoking status: Never Smoker  . Smokeless tobacco: Never Used  Vaping Use  . Vaping Use: Never used  Substance and Sexual Activity  . Alcohol use: No  . Drug use: No  . Sexual activity: Yes    Partners: Male    Birth control/protection: Post-menopausal  Other Topics Concern  . Not on file  Social History Narrative   Divorced   One daughter- lives locally and one grandson   Retired Pharmacist, hospital,  Has masters degree   Enjoys reading, spending time with her grandson   Social Determinants of Radio broadcast assistant Strain: Not on file  Food Insecurity: Not on file  Transportation Needs: Not on file  Physical Activity: Not on file  Stress: Not on file  Social Connections: Not on file     Family History: The patient's family history includes Breast  cancer in her maternal aunt; COPD in her father and mother; Cancer in her mother; Emphysema in her father and mother; Heart Problems in her father and mother; Heart disease in her brother and mother; Hyperlipidemia in her mother; Hypertension in her mother; Ovarian cancer in her mother; Stroke in her mother; Thyroid disease in her mother. There is no history of Colon cancer, Esophageal cancer, Rectal cancer, or Stomach cancer. ROS:   Please see the history of present illness.    All 14 point review of systems negative except as described per history of present illness  EKGs/Labs/Other Studies Reviewed:  Recent Labs: 04/04/2020: TSH 1.24 07/06/2020: ALT 27; BUN 10; Creatinine, Ser 0.68; Hemoglobin 11.0; Platelets 340.0; Potassium 3.7; Sodium 139  Recent Lipid Panel    Component Value Date/Time   CHOL 204 (H) 04/04/2017 0838   TRIG 130 04/04/2017 0838   HDL 62 04/04/2017 0838   CHOLHDL 3 10/24/2015 1054   VLDL 21.6 10/24/2015 1054   LDLCALC 116 (H) 04/04/2017 0838   LDLDIRECT 149.9 09/11/2010 0908    Physical Exam:    VS:  BP 140/62 (BP Location: Right Arm, Patient Position: Sitting)   Pulse 75   Ht 5\' 3"  (1.6 m)   Wt 193 lb (87.5 kg)   SpO2 97%   BMI 34.19 kg/m     Wt Readings from Last 3 Encounters:  07/12/20 193 lb (87.5 kg)  07/06/20 193 lb (87.5 kg)  05/16/20 194 lb 12.8 oz (88.4 kg)     GEN:  Well nourished, well developed in no acute distress HEENT: Normal NECK: No JVD; No carotid bruits LYMPHATICS: No lymphadenopathy CARDIAC: RRR, no murmurs, no rubs, no gallops RESPIRATORY:  Clear to auscultation without rales, wheezing or rhonchi  ABDOMEN: Soft, non-tender, non-distended MUSCULOSKELETAL:  No edema; No deformity  SKIN: Warm and dry LOWER EXTREMITIES: no swelling NEUROLOGIC:  Alert and oriented x 3 PSYCHIATRIC:  Normal affect   ASSESSMENT:    1. Chest pain of uncertain etiology   2. Essential (primary) hypertension   3. Prediabetes   4. Atypical chest  pain    PLAN:    In order of problems listed above:  1. Chest pain of uncertain etiology record from emergency room reviewed.  EKG no abnormality, troponins are negative x2.  CT of the chest negative.  We will schedule her to have a stress test make sure not missing any coronary artery disease.  But if that is negative then GI work-up need to be pursued. 2. Essential hypertension, blood pressure being relatively well controlled today we will continue present management. 3. Prediabetes iron see any recent hemoglobin A1c. 4. Anemia, only mild hemoglobin 11.2 hematocrit 36.5  I did review record for from the emergency room for that visit Medication Adjustments/Labs and Tests Ordered: Current medicines are reviewed at length with the patient today.  Concerns regarding medicines are outlined above.  Orders Placed This Encounter  Procedures  . MYOCARDIAL PERFUSION IMAGING  . EKG 12-Lead   Medication changes: No orders of the defined types were placed in this encounter.   Signed, Park Liter, MD, [facility] 07/12/2020 11:05 AM    [place]

## 2020-07-12 NOTE — Patient Instructions (Signed)
Medication Instructions:  Your physician recommends that you continue on your current medications as directed. Please refer to the Current Medication list given to you today.  *If you need a refill on your cardiac medications before your next appointment, please call your pharmacy*   Lab Work: None If you have labs (blood work) drawn today and your tests are completely normal, you will receive your results only by: [name] MyChart Message (if you have MyChart) OR . A paper copy in the mail If you have any lab test that is abnormal or we need to change your treatment, we will call you to review the results.   Testing/Procedures:   [facility] Cardiovascular Imaging at [facility] [address], [place], [place] [address] Phone: [phone]    Please arrive 15 minutes prior to your appointment time for registration and insurance purposes.  The test will take approximately 3 to 4 hours to complete; you may bring reading material.  If someone comes with you to your appointment, they will need to remain in the main lobby due to limited space in the testing area. **If you are pregnant or breastfeeding, please notify the nuclear lab prior to your appointment**  How to prepare for your Myocardial Perfusion Test: . Do not eat or drink 3 hours prior to your test, except you may have water. . Do not consume products containing caffeine (regular or decaffeinated) 12 hours prior to your test. (ex: coffee, chocolate, sodas, tea). . Do bring a list of your current medications with you.  If not listed below, you may take your medications as normal. . Do wear comfortable clothes (no dresses or overalls) and walking shoes, tennis shoes preferred (No heels or open toe shoes are allowed). . Do NOT wear cologne, perfume, aftershave, or lotions (deodorant is allowed). . If these instructions are not followed, your test will have to be rescheduled.  Please report to [address]  [address] for your test.  If you have questions or concerns about your appointment, you can call the Nuclear Lab at [phone].  If you cannot keep your appointment, please provide 24 hours notification to the Nuclear Lab, to avoid a possible $50 charge to your account.    Follow-Up: At [facility], you and your health needs are our priority.  As part of our continuing mission to provide you with exceptional heart care, we have created designated Provider Care Teams.  These Care Teams include your primary Cardiologist (physician) and Advanced Practice Providers (APPs -  Physician Assistants and Nurse Practitioners) who all work together to provide you with the care you need, when you need it.  We recommend signing up for the patient portal called "MyChart".  Sign up information is provided on this After Visit Summary.  MyChart is used to connect with patients for Virtual Visits (Telemedicine).  Patients are able to view lab/test results, encounter notes, upcoming appointments, etc.  Non-urgent messages can be sent to your provider as well.   To learn more about what you can do with MyChart, go to [email or web address].    Your next appointment:   3 month(s)  The format for your next appointment:   In Person  Provider:   Jenne Campus, MD   Other Instructions   Cardiac Nuclear Scan A cardiac nuclear scan is a test that measures blood flow to the heart when a person is resting and when he or she is exercising. The test looks for problems such as:  Not enough blood reaching a portion of the heart.  The heart muscle not working normally. You may need this test if:  You have heart disease.  You have had abnormal lab results.  You have had heart surgery or a balloon procedure to open up blocked arteries (angioplasty).  You have chest pain.  You have shortness of breath. In this test, a radioactive dye (tracer) is injected into your bloodstream. After the tracer has  traveled to your heart, an imaging device is used to measure how much of the tracer is absorbed by or distributed to various areas of your heart. This procedure is usually done at a hospital and takes 2-4 hours. Tell a health care provider about:  Any allergies you have.  All medicines you are taking, including vitamins, herbs, eye drops, creams, and over-the-counter medicines.  Any problems you or family members have had with anesthetic medicines.  Any blood disorders you have.  Any surgeries you have had.  Any medical conditions you have.  Whether you are pregnant or may be pregnant. What are the risks? Generally, this is a safe procedure. However, problems may occur, including:  Serious chest pain and heart attack. This is only a risk if the stress portion of the test is done.  Rapid heartbeat.  Sensation of warmth in your chest. This usually passes quickly.  Allergic reaction to the tracer. What happens before the procedure?  Ask your health care provider about changing or stopping your regular medicines. This is especially important if you are taking diabetes medicines or blood thinners.  Follow instructions from your health care provider about eating or drinking restrictions.  Remove your jewelry on the day of the procedure. What happens during the procedure?  An IV will be inserted into one of your veins.  Your health care provider will inject a small amount of radioactive tracer through the IV.  You will wait for 20-40 minutes while the tracer travels through your bloodstream.  Your heart activity will be monitored with an electrocardiogram (ECG).  You will lie down on an exam table.  Images of your heart will be taken for about 15-20 minutes.  You may also have a stress test. For this test, one of the following may be done: ? You will exercise on a treadmill or stationary bike. While you exercise, your heart's activity will be monitored with an ECG, and your  blood pressure will be checked. ? You will be given medicines that will increase blood flow to parts of your heart. This is done if you are unable to exercise.  When blood flow to your heart has peaked, a tracer will again be injected through the IV.  After 20-40 minutes, you will get back on the exam table and have more images taken of your heart.  Depending on the type of tracer used, scans may need to be repeated 3-4 hours later.  Your IV line will be removed when the procedure is over. The procedure may vary among health care providers and hospitals. What happens after the procedure?  Unless your health care provider tells you otherwise, you may return to your normal schedule, including diet, activities, and medicines.  Unless your health care provider tells you otherwise, you may increase your fluid intake. This will help to flush the contrast dye from your body. Drink enough fluid to keep your urine pale yellow.  Ask your health care provider, or the department that is doing the test: ? When will my results  be ready? ? How will I get my results? Summary  A cardiac nuclear scan measures the blood flow to the heart when a person is resting and when he or she is exercising.  Tell your health care provider if you are pregnant.  Before the procedure, ask your health care provider about changing or stopping your regular medicines. This is especially important if you are taking diabetes medicines or blood thinners.  After the procedure, unless your health care provider tells you otherwise, increase your fluid intake. This will help flush the contrast dye from your body.  After the procedure, unless your health care provider tells you otherwise, you may return to your normal schedule, including diet, activities, and medicines. This information is not intended to replace advice given to you by your health care provider. Make sure you discuss any questions you have with your health care  provider. Document Revised: 07/29/2017 Document Reviewed: 07/29/2017 Elsevier Patient Education  [employer].

## 2020-07-14 ENCOUNTER — Telehealth (HOSPITAL_COMMUNITY): Payer: Self-pay | Admitting: *Deleted

## 2020-07-14 NOTE — Telephone Encounter (Signed)
Left message on voicemail per DPR in reference to upcoming appointment scheduled on 07/18/20 at 8:00 with detailed instructions given per Myocardial Perfusion Study Information Sheet for the test. LM to arrive 15 minutes early, and that it is imperative to arrive on time for appointment to keep from having the test rescheduled. If you need to cancel or reschedule your appointment, please call the office within 24 hours of your appointment. Failure to do so may result in a cancellation of your appointment, and a $50 no show fee. Phone number given for call back for any questions.

## 2020-07-17 ENCOUNTER — Telehealth: Payer: Self-pay | Admitting: Nurse Practitioner

## 2020-07-17 NOTE — Telephone Encounter (Signed)
Beth, pls contact the patient. Let her know Dr. Carlean Purl recommended for her to have both an EGD and colonoscopy at the same time. Refer to office visit note 07/06/2020. She is scheduled for a cardiac stress test Monday 07/18/2020, if negative then she will be cleared by her cardiologist Dr. Agustin Cree to her GI procedures. Please call her Wed 07/20/2020 to schedule EGD/colonoscopy with Dr. Carlean Purl. I will follow up on the stress test result. Thx

## 2020-07-18 ENCOUNTER — Ambulatory Visit (HOSPITAL_COMMUNITY): Payer: Medicare PPO | Attending: Cardiovascular Disease

## 2020-07-18 ENCOUNTER — Other Ambulatory Visit: Payer: Self-pay

## 2020-07-18 DIAGNOSIS — R079 Chest pain, unspecified: Secondary | ICD-10-CM | POA: Insufficient documentation

## 2020-07-18 LAB — MYOCARDIAL PERFUSION IMAGING
LV dias vol: 49 mL (ref 46–106)
LV sys vol: 13 mL
Peak HR: 103 {beats}/min
Rest HR: 76 {beats}/min
SDS: 0
SRS: 0
SSS: 0
TID: 1.07

## 2020-07-18 MED ORDER — TECHNETIUM TC 99M TETROFOSMIN IV KIT
8.7000 | PACK | Freq: Once | INTRAVENOUS | Status: AC | PRN
Start: 2020-07-18 — End: 2020-07-18
  Administered 2020-07-18: 8.7 via INTRAVENOUS
  Filled 2020-07-18: qty 9

## 2020-07-18 MED ORDER — TECHNETIUM TC 99M TETROFOSMIN IV KIT
29.1000 | PACK | Freq: Once | INTRAVENOUS | Status: AC | PRN
  Administered 2020-07-18: 29.1 via INTRAVENOUS
  Filled 2020-07-18: qty 30

## 2020-07-18 MED ORDER — REGADENOSON 0.4 MG/5ML IV SOLN
0.4000 mg | Freq: Once | INTRAVENOUS | Status: AC
  Administered 2020-07-18: 0.4 mg via INTRAVENOUS

## 2020-07-20 ENCOUNTER — Telehealth: Payer: Self-pay | Admitting: Cardiology

## 2020-07-20 NOTE — Telephone Encounter (Signed)
PT is returning a call about results 

## 2020-07-20 NOTE — Telephone Encounter (Signed)
Patient informed of results.  

## 2020-07-21 ENCOUNTER — Other Ambulatory Visit: Payer: Self-pay

## 2020-07-26 ENCOUNTER — Telehealth: Payer: Self-pay | Admitting: Nurse Practitioner

## 2020-07-26 DIAGNOSIS — M48061 Spinal stenosis, lumbar region without neurogenic claudication: Secondary | ICD-10-CM | POA: Diagnosis not present

## 2020-07-26 DIAGNOSIS — Z981 Arthrodesis status: Secondary | ICD-10-CM | POA: Diagnosis not present

## 2020-07-26 DIAGNOSIS — I10 Essential (primary) hypertension: Secondary | ICD-10-CM | POA: Diagnosis not present

## 2020-07-26 DIAGNOSIS — Z6834 Body mass index (BMI) 34.0-34.9, adult: Secondary | ICD-10-CM | POA: Diagnosis not present

## 2020-07-26 NOTE — Telephone Encounter (Signed)
07/26/2020 01:15 PM Phone (Incoming) [place], Aleina Burgio (Self) [phone] (H)   Question about how Cardiology in [place] let us know if she is cleared to have procedures done.     By Irwin Brakeman I       Dr.Krasowski please advise if patient is clear to proceed with EGD. Thank you!

## 2020-07-27 DIAGNOSIS — J4540 Moderate persistent asthma, uncomplicated: Secondary | ICD-10-CM | POA: Diagnosis not present

## 2020-07-29 ENCOUNTER — Other Ambulatory Visit: Payer: Self-pay | Admitting: Family

## 2020-08-01 ENCOUNTER — Encounter: Payer: Self-pay | Admitting: Internal Medicine

## 2020-08-01 NOTE — Telephone Encounter (Signed)
Spoke with the patient. Double procedure scheduled for her.

## 2020-08-11 DIAGNOSIS — J4540 Moderate persistent asthma, uncomplicated: Secondary | ICD-10-CM | POA: Diagnosis not present

## 2020-08-15 ENCOUNTER — Ambulatory Visit (AMBULATORY_SURGERY_CENTER): Payer: Medicare PPO | Admitting: *Deleted

## 2020-08-15 ENCOUNTER — Other Ambulatory Visit: Payer: Self-pay

## 2020-08-15 VITALS — Ht 63.0 in | Wt 193.0 lb

## 2020-08-15 DIAGNOSIS — D509 Iron deficiency anemia, unspecified: Secondary | ICD-10-CM

## 2020-08-15 NOTE — Progress Notes (Signed)
Pt verified name, DOB, address and insurance during PV today. Pt mailed instruction packet to included paper to complete and mail back to [facility] with addressed and stamped envelope, Emmi video, copy of consent form to read and not return, and instructions. PV completed over the phone. Pt encouraged to call with questions or issues.  My Chart instructions to pt as well   PT IS SCHEDULED TO HAVE ECL ON 7-5 11 AM WITH GESSNER PER PV NOTE CG  FOR IDA   No egg or soy allergy known to patient  No issues with past sedation with any surgeries or procedures Patient denies ever being told they had issues or difficulty with intubation  No FH of Malignant Hyperthermia No diet pills per patient No home 02 use per patient  No blood thinners per patient  Pt denies issues with constipation  No A fib or A flutter  EMMI video to pt or via [place] 19 guidelines implemented in PV today with Pt and RN  Pt is fully vaccinated  for Covid   Due to the COVID-19 pandemic we are asking patients to follow certain guidelines.  Pt aware of COVID protocols and [facility] guidelines

## 2020-08-16 ENCOUNTER — Ambulatory Visit: Payer: Medicare PPO | Admitting: Family

## 2020-08-24 DIAGNOSIS — J3089 Other allergic rhinitis: Secondary | ICD-10-CM | POA: Diagnosis not present

## 2020-08-24 DIAGNOSIS — J301 Allergic rhinitis due to pollen: Secondary | ICD-10-CM | POA: Diagnosis not present

## 2020-08-24 DIAGNOSIS — J4540 Moderate persistent asthma, uncomplicated: Secondary | ICD-10-CM | POA: Diagnosis not present

## 2020-08-24 DIAGNOSIS — J3081 Allergic rhinitis due to animal (cat) (dog) hair and dander: Secondary | ICD-10-CM | POA: Diagnosis not present

## 2020-08-30 ENCOUNTER — Other Ambulatory Visit (INDEPENDENT_AMBULATORY_CARE_PROVIDER_SITE_OTHER): Payer: Medicare PPO

## 2020-08-30 ENCOUNTER — Encounter: Payer: Self-pay | Admitting: Internal Medicine

## 2020-08-30 ENCOUNTER — Ambulatory Visit (AMBULATORY_SURGERY_CENTER): Payer: Medicare PPO | Admitting: Internal Medicine

## 2020-08-30 ENCOUNTER — Other Ambulatory Visit: Payer: Self-pay

## 2020-08-30 ENCOUNTER — Encounter: Payer: Medicare PPO | Admitting: Internal Medicine

## 2020-08-30 VITALS — BP 135/70 | HR 80 | Temp 97.8°F | Resp 21 | Ht 63.0 in

## 2020-08-30 DIAGNOSIS — K449 Diaphragmatic hernia without obstruction or gangrene: Secondary | ICD-10-CM | POA: Diagnosis not present

## 2020-08-30 DIAGNOSIS — K5730 Diverticulosis of large intestine without perforation or abscess without bleeding: Secondary | ICD-10-CM | POA: Diagnosis not present

## 2020-08-30 DIAGNOSIS — D509 Iron deficiency anemia, unspecified: Secondary | ICD-10-CM

## 2020-08-30 DIAGNOSIS — K317 Polyp of stomach and duodenum: Secondary | ICD-10-CM | POA: Diagnosis not present

## 2020-08-30 LAB — CBC
HCT: 35.1 % — ABNORMAL LOW (ref 36.0–46.0)
Hemoglobin: 11.2 g/dL — ABNORMAL LOW (ref 12.0–15.0)
MCHC: 32 g/dL (ref 30.0–36.0)
MCV: 74.2 fl — ABNORMAL LOW (ref 78.0–100.0)
Platelets: 374 10*3/uL (ref 150.0–400.0)
RBC: 4.73 Mil/uL (ref 3.87–5.11)
RDW: 18.5 % — ABNORMAL HIGH (ref 11.5–15.5)
WBC: 7.3 10*3/uL (ref 4.0–10.5)

## 2020-08-30 LAB — VITAMIN B12: Vitamin B-12: 257 pg/mL (ref 211–911)

## 2020-08-30 LAB — FERRITIN: Ferritin: 10.5 ng/mL (ref 10.0–291.0)

## 2020-08-30 MED ORDER — SODIUM CHLORIDE 0.9 % IV SOLN: 500.0000 mL | Freq: Once | INTRAVENOUS | Status: DC

## 2020-08-30 NOTE — Progress Notes (Signed)
Called to room to assist during endoscopic procedure.  Patient ID and intended procedure confirmed with present staff. Received instructions for my participation in the procedure from the performing physician.  

## 2020-08-30 NOTE — Op Note (Signed)
[place] Patient Name: Brandy Dunlap Procedure Date: 08/30/2020 10:56 AM MRN: [id number] Endoscopist: Gatha Mayer , MD Age: 70 Referring MD:  Date of Birth: 1951/01/20 Gender: Female Account #: [id number] Procedure:                Colonoscopy Indications:              Iron deficiency anemia Medicines:                Propofol per Anesthesia, Monitored Anesthesia Care Procedure:                Pre-Anesthesia Assessment:                           - Prior to the procedure, a History and Physical                            was performed, and patient medications and                            allergies were reviewed. The patient's tolerance of                            previous anesthesia was also reviewed. The risks                            and benefits of the procedure and the sedation                            options and risks were discussed with the patient.                            All questions were answered, and informed consent                            was obtained. Prior Anticoagulants: The patient has                            taken no previous anticoagulant or antiplatelet                            agents. ASA Grade Assessment: II - A patient with                            mild systemic disease. After reviewing the risks                            and benefits, the patient was deemed in                            satisfactory condition to undergo the procedure.                           After obtaining informed consent, the colonoscope  was passed under direct vision. Throughout the                            procedure, the patient's blood pressure, pulse, and                            oxygen saturations were monitored continuously. The                            Olympus PCF-[id number] (#[id number]) Colonoscope was                            introduced through the anus and advanced to the the                            cecum,  identified by appendiceal orifice and                            ileocecal valve. The ileocecal valve, appendiceal                            orifice, and rectum were photographed. The quality                            of the bowel preparation was good. The colonoscopy                            was performed without difficulty. The patient                            tolerated the procedure well. The bowel preparation                            used was Miralax via split dose instruction. Scope In: 11:12:08 AM Scope Out: 11:20:08 AM Scope Withdrawal Time: 0 hours 5 minutes 54 seconds  Total Procedure Duration: 0 hours 8 minutes 0 seconds  Findings:                 The perianal and digital rectal examinations were                            normal.                           Multiple diverticula were found in the sigmoid                            colon and descending colon.                           The exam was otherwise without abnormality on                            direct and retroflexion views. Complications:            No  immediate complications. Estimated blood loss:                            None. Estimated Blood Loss:     Estimated blood loss: none. Impression:               - Diverticulosis in the sigmoid colon and in the                            descending colon.                           - The examination was otherwise normal on direct                            and retroflexion views.                           - No specimens collected. NO CAUSE OF IRON                            DEFICIENCY ANEMIA SEEN. I THINK SHE CANNOT CATCH UP                            FROM ANEMIA AND IRON DEFICIENCY RELETAED TO PRIOR                            PER-OP BLOOD LOSS - COULD BE ACID SUPPRESSION                            CAUSING IMPAIRMENT OF IRON ABSORPTION Recommendation:           - Patient has a contact number available for                            emergencies. The signs and  symptoms of potential                            delayed complications were discussed with the                            patient. Return to normal activities tomorrow.                            Written discharge instructions were provided to the                            patient.                           - Resume previous diet.                           - Continue present medications.                           -  No repeat colonoscopy due to current age (60                            years or older) and the absence of colonic polyps.                           - CBC AND FERRITIN AND B12 TODAY (ORDERED) - if                            still low will have her see hematology for                            parenteral iron) Gatha Mayer, MD 08/30/2020 11:37:31 AM This report has been signed electronically.

## 2020-08-30 NOTE — Op Note (Signed)
[facility] Patient Name: Brandy Dunlap Procedure Date: 08/30/2020 10:57 AM MRN: [id number] Endoscopist: Gatha Mayer , MD Age: 70 Referring MD:  Date of Birth: 11/25/1950 Gender: Female Account #: [id number] Procedure:                Upper GI endoscopy Indications:              Iron deficiency anemia Medicines:                Propofol per Anesthesia, Monitored Anesthesia Care Procedure:                Pre-Anesthesia Assessment:                           - Prior to the procedure, a History and Physical                            was performed, and patient medications and                            allergies were reviewed. The patient's tolerance of                            previous anesthesia was also reviewed. The risks                            and benefits of the procedure and the sedation                            options and risks were discussed with the patient.                            All questions were answered, and informed consent                            was obtained. Prior Anticoagulants: The patient has                            taken no previous anticoagulant or antiplatelet                            agents. ASA Grade Assessment: II - A patient with                            mild systemic disease. After reviewing the risks                            and benefits, the patient was deemed in                            satisfactory condition to undergo the procedure.                           After obtaining informed consent, the endoscope was  passed under direct vision. Throughout the                            procedure, the patient's blood pressure, pulse, and                            oxygen saturations were monitored continuously. The                            Endoscope was introduced through the mouth, and                            advanced to the second part of duodenum. The upper                            GI  endoscopy was accomplished without difficulty.                            The patient tolerated the procedure well. Scope In: Scope Out: Findings:                 The examined esophagus was normal.                           A 5 cm hiatal hernia was present.                           Two diminutive sessile polyps with no bleeding and                            stigmata of recent bleeding were found in the                            gastric body. Biopsies were taken with a cold                            forceps for histology. Verification of patient                            identification for the specimen was done. Estimated                            blood loss was minimal.                           The examined duodenum was normal.                           The exam was otherwise without abnormality.                           The cardia and gastric fundus were normal on                            retroflexion. Complications:  No immediate complications. Estimated Blood Loss:     Estimated blood loss was minimal. Impression:               - Normal esophagus.                           - 5 cm hiatal hernia.                           - Two gastric polyps. Biopsied. and removed                           - Normal examined duodenum.                           - The examination was otherwise normal. no cause of                            iron deficiency seen - though could lose blood from                            polyps doubt it is significant Recommendation:           - See the other procedure note for documentation of                            additional recommendations. COLONOSCOPY NEXT                           - OFFICE WILL SCHEDULE MODIFIED BARIUM SWALLOW RE:                            COUGH WITH DRINKING ? ASPIRATION Gatha Mayer, MD 08/30/2020 11:31:39 AM This report has been signed electronically.

## 2020-08-30 NOTE — Progress Notes (Signed)
Pt's states no medical or surgical changes since previsit or office visit. 

## 2020-08-30 NOTE — Patient Instructions (Addendum)
Brandy Dunlap,  Brandy bad going on here.  You had 2 little stomach Dunlap, Brandy hernia and a normal colonoscopy.  I think you became iron deficient when you lost some blood with hip surgery and tried to make more blood you ran out of iron so to speak.  Taking Dexilant can reduce your ability to absorb iron.  You can try taking your iron pills with some apple cider vinegar put into Dunlap, Brandy some orange juice just a small amount.  I Dunlap going to refer you to hematology to see about getting iron infusions if levels still low - will check labs today.  I will let you know the pathology results.  I Dunlap going to send you for what is called a modified barium swallow to try to understand your cough better.  A speech pathologist and radiologist will administer different consistencies of liquids and foods to check out the upper throat where it joins the esophagus to see if liquids are going into your lungs causing a cough.  I think that is possible.   I appreciate the opportunity to care for you. Gatha Mayer, MD, [facility]  Handout given on Brandy hernia.  YOU HAD AN ENDOSCOPIC PROCEDURE TODAY AT [facility]:   Refer to the procedure report that was given to you for any specific questions about what was found during the examination.  If the procedure report does not answer your questions, please call your gastroenterologist to clarify.  If you requested that your care partner not be given the details of your procedure findings, then the procedure report has been included in a sealed envelope for you to review at your convenience later.  YOU SHOULD EXPECT: Some feelings of bloating in the abdomen. Passage of more gas than usual.  Walking can help get rid of the air that was put into your GI tract during the procedure and reduce the bloating. If you had a lower endoscopy (such as a colonoscopy Brandy flexible sigmoidoscopy) you may notice spotting of blood in your stool Brandy on the toilet paper. If you  underwent a bowel prep for your procedure, you may not have a normal bowel movement for a few days.  Please Note:  You might notice some irritation and congestion in your nose Brandy some drainage.  This is from the oxygen used during your procedure.  There is no need for concern and it should clear up in a day Brandy so.  SYMPTOMS TO REPORT IMMEDIATELY:  Following lower endoscopy (colonoscopy Brandy flexible sigmoidoscopy):  Excessive amounts of blood in the stool  Significant tenderness Brandy worsening of abdominal pains  Swelling of the abdomen that is new, acute  Fever of 100F Brandy higher  Following upper endoscopy (EGD)  Vomiting of blood Brandy coffee ground material  New chest pain Brandy pain under the shoulder blades  Painful Brandy persistently difficult swallowing  New shortness of breath  Fever of 100F Brandy higher  Black, tarry-looking stools  For urgent Brandy emergent issues, a gastroenterologist can be reached at any hour by calling [phone]. Do not use MyChart messaging for urgent concerns.    DIET:  We do recommend a small meal at first, but then you may proceed to your regular diet.  Drink plenty of fluids but you should avoid alcoholic beverages for 24 hours.  ACTIVITY:  You should plan to take it easy for the rest of today and you should NOT DRIVE Brandy use heavy machinery until tomorrow (because of  the sedation medicines used during the test).    FOLLOW UP: Our staff will call the number listed on your records 48-72 hours following your procedure to check on you and address any questions Brandy concerns that you may have regarding the information given to you following your procedure. If we do not reach you, we will leave a message.  We will attempt to reach you two times.  During this call, we will ask if you have developed any symptoms of COVID 19. If you develop any symptoms (ie: fever, flu-like symptoms, shortness of breath, cough etc.) before then, please call [phone].  If you test  positive for Covid 19 in the 2 weeks post procedure, please call and report this information to [name].    If any biopsies were taken you will be contacted by phone Brandy by letter within the next 1-3 weeks.  Please call us at [phone] if you have not heard about the biopsies in 3 weeks.    SIGNATURES/CONFIDENTIALITY: You and/Brandy your care partner have signed paperwork which will be entered into your electronic medical record.  These signatures attest to the fact that that the information above on your After Visit Summary has been reviewed and is understood.  Full responsibility of the confidentiality of this discharge information lies with you and/Brandy your care-partner.

## 2020-08-30 NOTE — Progress Notes (Signed)
PT taken to PACU. Monitors in place. VSS. Report given to RN. 

## 2020-08-31 ENCOUNTER — Telehealth: Payer: Self-pay

## 2020-08-31 DIAGNOSIS — R059 Cough, unspecified: Secondary | ICD-10-CM

## 2020-09-01 ENCOUNTER — Telehealth: Payer: Self-pay

## 2020-09-01 ENCOUNTER — Other Ambulatory Visit (HOSPITAL_COMMUNITY): Payer: Self-pay

## 2020-09-01 DIAGNOSIS — R1310 Dysphagia, unspecified: Secondary | ICD-10-CM

## 2020-09-01 NOTE — Telephone Encounter (Signed)
Attempted f/u call. Will try again this afternoon. 

## 2020-09-01 NOTE — Telephone Encounter (Signed)
Left message on answering machine. 

## 2020-09-01 NOTE — Telephone Encounter (Signed)
Patient notified of MBS scheduled for 09/09/20 11:00.  She is aware to arrive at [facility] and register in radiology

## 2020-09-02 ENCOUNTER — Other Ambulatory Visit: Payer: Self-pay | Admitting: Family

## 2020-09-02 ENCOUNTER — Telehealth: Payer: Self-pay | Admitting: Hematology and Oncology

## 2020-09-02 LAB — METHYLMALONIC ACID, SERUM: Methylmalonic Acid, Quant: 91 nmol/L (ref 87–318)

## 2020-09-02 NOTE — Telephone Encounter (Signed)
Received a new hem referral from Dr. Carlean Purl for IDA. Ms. Scharf has been cld and scheduled to see Dr. Chryl Heck on 7/14 at 1120am. Pt aware to arrive 20 minutes early.

## 2020-09-02 NOTE — Telephone Encounter (Signed)
Patient aware of the MBS scheduled at [facility] for 09/09/20 11:00.

## 2020-09-03 ENCOUNTER — Other Ambulatory Visit: Payer: Self-pay | Admitting: Family

## 2020-09-06 NOTE — Telephone Encounter (Signed)
error 

## 2020-09-08 ENCOUNTER — Inpatient Hospital Stay: Payer: Medicare PPO | Attending: Hematology and Oncology | Admitting: Hematology and Oncology

## 2020-09-08 ENCOUNTER — Inpatient Hospital Stay: Payer: Medicare PPO

## 2020-09-08 ENCOUNTER — Other Ambulatory Visit: Payer: Self-pay

## 2020-09-08 ENCOUNTER — Encounter: Payer: Self-pay | Admitting: Hematology and Oncology

## 2020-09-08 DIAGNOSIS — Z8041 Family history of malignant neoplasm of ovary: Secondary | ICD-10-CM | POA: Diagnosis not present

## 2020-09-08 DIAGNOSIS — D509 Iron deficiency anemia, unspecified: Secondary | ICD-10-CM | POA: Diagnosis not present

## 2020-09-08 DIAGNOSIS — Z809 Family history of malignant neoplasm, unspecified: Secondary | ICD-10-CM

## 2020-09-08 DIAGNOSIS — Z96649 Presence of unspecified artificial hip joint: Secondary | ICD-10-CM | POA: Diagnosis not present

## 2020-09-08 DIAGNOSIS — J4540 Moderate persistent asthma, uncomplicated: Secondary | ICD-10-CM | POA: Diagnosis not present

## 2020-09-08 DIAGNOSIS — Z803 Family history of malignant neoplasm of breast: Secondary | ICD-10-CM | POA: Insufficient documentation

## 2020-09-08 NOTE — Assessment & Plan Note (Signed)
This is a very pleasant 70 year old female patient with past medical history significant for hypertension, hip replacement, iron deficiency since she had her replacement surgery referred to hematology for further recommendations since she has been not tolerating oral iron very well.  She is here by herself.  Complains of severe exhaustion, fatigue, some craving for ice no change in bowel habits except for intermittent diarrhea.  She had endoscopy and colonoscopy which did not show any evidence for malignancy.  She is referred to hematology for further consideration of intravenous iron at this time.  We have discussed the following findings with the patient today.    Iron deficiency anemia affects a large proportion of the wellness population especially females of childbearing age and children.  Common causes of iron deficiency include blood loss, reduced iron absorption because of previous surgeries, dietary restrictions or other malabsorption issues, medications that reduce gastric acidity are due to inherited disorders such as IRIDA due to TMPRSS6 mutation. Symptoms of iron deficiency usually include fatigue, pica, restless legs, exercise intolerance, exertional dyspnea, headaches and weakness. Diagnosis can be usually made by history, physical examination, CBC and iron studies.  We generally treat iron deficiency anemia with oral supplements if this can be tolerated.  IV iron is appropriate for patients are unable to tolerate due to gastrointestinal side effects or for patients with severe/ongoing blood loss, history of gastric bypass which reduces gastric acid and henceforth will impair intestinal absorption of oral iron, malabsorption syndromes are occasional in pregnancy. There are several formularies of intravenous iron available in the market.  We have discussed about risk of allergic/infusion reactions including potentially life-threatening anaphylaxis with intravenous iron however these serious  allergic reactions are exceedingly rare and overestimated.  In contrast to serious allergic reactions, IV iron may be associated with nonallergic infusion reactions including self-limited urticaria, palpitations, dizziness, neck and back spasm which again occur in less than 1% of the individuals and do not progress to more serious reactions.  We will proceed with IV Venofer since she is unable to tolerate oral iron very well.  She will proceed with scheduling Venofer infusion return to clinic for follow-up in about 3 months with repeat labs.

## 2020-09-08 NOTE — Progress Notes (Signed)
[name] CONSULT NOTE  Patient Care Team: [name], NP as PCP - General (Internal Medicine)  CHIEF COMPLAINTS/PURPOSE OF CONSULTATION:  IDA  ASSESSMENT & PLAN:  IDA (iron deficiency anemia) This is a very pleasant 70 year old female patient with past medical history significant for hypertension, hip replacement, iron deficiency since she had her replacement surgery referred to hematology for further recommendations since she has been not tolerating oral iron very well.  She is here by herself.  Complains of severe exhaustion, fatigue, some craving for ice no change in bowel habits except for intermittent diarrhea.  She had endoscopy and colonoscopy which did not show any evidence for malignancy.  She is referred to hematology for further consideration of intravenous iron at this time.  We have discussed the following findings with the patient today.    Iron deficiency anemia affects a large proportion of the wellness population especially females of childbearing age and children.  Common causes of iron deficiency include blood loss, reduced iron absorption because of previous surgeries, dietary restrictions or other malabsorption issues, medications that reduce gastric acidity are due to inherited disorders such as IRIDA due to TMPRSS6 mutation. Symptoms of iron deficiency usually include fatigue, pica, restless legs, exercise intolerance, exertional dyspnea, headaches and weakness. Diagnosis can be usually made by history, physical examination, CBC and iron studies.  We generally treat iron deficiency anemia with oral supplements if this can be tolerated.  IV iron is appropriate for patients are unable to tolerate due to gastrointestinal side effects or for patients with severe/ongoing blood loss, history of gastric bypass which reduces gastric acid and henceforth will impair intestinal absorption of oral iron, malabsorption syndromes are occasional in pregnancy. There  are several formularies of intravenous iron available in the market.  We have discussed about risk of allergic/infusion reactions including potentially life-threatening anaphylaxis with intravenous iron however these serious allergic reactions are exceedingly rare and overestimated.  In contrast to serious allergic reactions, IV iron may be associated with nonallergic infusion reactions including self-limited urticaria, palpitations, dizziness, neck and back spasm which again occur in less than 1% of the individuals and do not progress to more serious reactions.  We will proceed with IV Venofer since she is unable to tolerate oral iron very well.  She will proceed with scheduling Venofer infusion return to clinic for follow-up in about 3 months with repeat labs.  No orders of the defined types were placed in this encounter.      HISTORY OF PRESENTING ILLNESS:  Brandy Dunlap 70 y.o. female is here because of IDA.  This is a very pleasant 70 year old female patient with past medical history significant for hip replacement, iron deficiency anemia referred to hematology for evaluation of iron deficiency anemia.  She tells me that for the past year, she has been having issues with iron deficiency and has been taking oral iron intermittently.  She however cannot tolerate it very well because of diarrhea associated with oral iron.  She feels exhausted, fatigued with every activity that she can do, she used to go to the gym 4-5 times a week but currently can barely walk every day.  She was hoping we can give her some intravenous iron since she cannot tolerate oral iron very well.  She denies any hematochezia or melena.  She does have some abnormal bowel habits such as diarrhea intermittently, she had endoscopy and colonoscopy recently without any pathological findings.  She does take chronic PPIs which can impair iron absorption.  Rest of the pertinent 10 point ROS reviewed and negative.  REVIEW OF  SYSTEMS:   Constitutional: Denies fevers, chills or abnormal night sweats Eyes: Denies blurriness of vision, double vision or watery eyes Ears, nose, mouth, throat, and face: Denies mucositis or sore throat Respiratory: Denies cough, dyspnea or wheezes Cardiovascular: Denies palpitation, chest discomfort or lower extremity swelling Gastrointestinal:  Denies nausea, heartburn or change in bowel habits Skin: Denies abnormal skin rashes Lymphatics: Denies new lymphadenopathy or easy bruising Neurological:Denies numbness, tingling or new weaknesses Behavioral/Psych: Mood is stable, no new changes  All other systems were reviewed with the patient and are negative.  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   Essential hypertension 10/08/2007   Qualifier: Diagnosis of  By: Arnoldo Morale MD, Balinda Quails    GERD (gastroesophageal reflux disease)    History of kidney stones    per 09-02-2018 pre-op eval , suspected kidney stone , to [facility] CT abd on 7-9- for further eval , passive   Hypertension    Infectious disease 05/23/2015   Low back pain    Osteoarthritis    Pneumonia    Pre-diabetes    Prediabetes 07/09/2016   Referred otalgia of left ear 03/26/2016   Sinus trouble     SURGICAL HISTORY: Past Surgical History:  Procedure Laterality Date   CHOLECYSTECTOMY     COLONOSCOPY     COLONOSCOPY W/ BIOPSIES  10/06/2003   LUMBAR FUSION  2008   LUMBAR FUSION      2020   PARTIAL HIP ARTHROPLASTY Right 2009    hip replacement   TONSILLECTOMY     TOTAL HIP ARTHROPLASTY Left 09/10/2018   Procedure: TOTAL HIP ARTHROPLASTY ANTERIOR APPROACH;  Surgeon: Gaynelle Arabian, MD;  Location: [facility];  Service: Orthopedics;  Laterality: Left;   TOTAL KNEE ARTHROPLASTY Right 11/16/2013   Procedure: RIGHT TOTAL KNEE ARTHROPLASTY;  Surgeon: Vickey Huger, MD;  Location: [place];  Service: Orthopedics;  Laterality: Right;   VIDEO BRONCHOSCOPY Bilateral 12/05/2012   Procedure: VIDEO BRONCHOSCOPY WITHOUT FLUORO;  Surgeon: Collene Gobble, MD;  Location: [facility];  Service: Cardiopulmonary;  Laterality: Bilateral;    SOCIAL HISTORY: Social History   Socioeconomic History   Marital status: Single    Spouse name: Not on file   Number of children: 1   Years of education: Not on file   Highest education level: Not on file  Occupational History   Occupation: Pharmacist, hospital    Employer: [employer]  Tobacco Use   Smoking status: Never   Smokeless tobacco: Never  Vaping Use   Vaping Use: Never used  Substance and Sexual Activity   Alcohol use: No   Drug use: No   Sexual activity: Yes    Partners: Male    Birth control/protection: Post-menopausal  Other Topics Concern   Not on file  Social History  Narrative   Divorced   One daughter- lives locally and one grandson   Retired Pharmacist, hospital,  Has masters degree   Enjoys reading, spending time with her grandson   Social Determinants of Radio broadcast assistant Strain: Not on file  Food Insecurity: Not on file  Transportation Needs: Not on file  Physical Activity: Not on file  Stress: Not on file  Social Connections: Not on file  Intimate Partner Violence: Not on file    FAMILY HISTORY: Family History  Problem Relation Age of Onset   COPD Mother    Heart disease Mother    Ovarian cancer Mother    Emphysema Mother    Hypertension Mother    Hyperlipidemia Mother    Heart Problems Mother    Stroke  Mother    Thyroid disease Mother    Cancer Mother    COPD Father        died at age 2   Emphysema Father    Heart Problems Father    Heart disease Brother        stent with 90%   Breast cancer Maternal Aunt    Colon cancer Neg Hx    Esophageal cancer Neg Hx    Rectal cancer Neg Hx    Stomach cancer Neg Hx    Colon polyps Neg Hx     ALLERGIES:  is allergic to pantoprazole sodium.  MEDICATIONS:  Current Outpatient Medications  Medication Sig Dispense Refill   amLODipine (NORVASC) 5 MG tablet TAKE 1 TABLET (5 MG TOTAL) BY MOUTH DAILY. 30 tablet 0   budesonide-formoterol (SYMBICORT) 160-4.5 MCG/ACT inhaler Inhale 2 puffs into the lungs 2 (two) times daily.     Cholecalciferol (VITAMIN D3) 75 MCG (3000 UT) TABS Take 1 tablet by mouth once daily. (Patient taking differently: Take 3,000 Units by mouth daily. Take 1 tablet by mouth once daily.) 90 tablet 1   dexlansoprazole (DEXILANT) 60 MG capsule TAKE 1 CAPSULE BY MOUTH EVERY DAY 90 capsule 1   diclofenac sodium (VOLTAREN) 1 % GEL Apply 2 g topically 4 (four) times daily as needed. (Patient taking differently: Apply 2 g topically 4 (four) times daily as needed (L knee pain).) 100 g 3   EPINEPHrine 0.3 mg/0.3 mL IJ SOAJ injection Inject 0.3 mg into the muscle as needed for anaphylaxis.     omalizumab (XOLAIR) 150 MG injection Inject 150 mg into the skin every 14 (fourteen) days.     traMADol (ULTRAM) 50 MG tablet Take 50-100 mg by mouth every 6 (six) hours as needed for pain.     albuterol (VENTOLIN HFA) 108 (90 Base) MCG/ACT inhaler Inhale into the lungs every 6 (six) hours as needed for wheezing or shortness of breath.     No current facility-administered medications for this visit.     PHYSICAL EXAMINATION:  ECOG PERFORMANCE STATUS: 0 - Asymptomatic  Vitals:   09/08/20 1049  BP: 137/61  Pulse: 90  Resp: 18  Temp: (!) 97.2 F (36.2 C)  SpO2: 95%   Filed Weights   09/08/20 1049  Weight: 193 lb 2 oz (87.6 kg)     GENERAL:alert, no distress and comfortable SKIN: skin color, texture, turgor are normal, no rashes or significant lesions EYES: normal, conjunctiva are pink and non-injected, sclera clear OROPHARYNX:no exudate, no erythema and lips, buccal mucosa, and tongue normal  NECK: supple, thyroid normal size, non-tender, without nodularity LYMPH:  no palpable lymphadenopathy in the cervical, axillary or inguinal LUNGS: clear to auscultation and percussion  with normal breathing effort HEART: regular rate & rhythm and no murmurs and no lower extremity edema ABDOMEN:abdomen soft, non-tender and normal bowel sounds Musculoskeletal:no cyanosis of digits and no clubbing  PSYCH: alert & oriented x 3 with fluent speech NEURO: no focal motor/sensory deficits  LABORATORY DATA:  I have reviewed the data as listed Lab Results  Component Value Date   WBC 7.3 08/30/2020   HGB 11.2 (L) 08/30/2020   HCT 35.1 (L) 08/30/2020   MCV 74.2 (L) 08/30/2020   PLT 374.0 08/30/2020     Chemistry      Component Value Date/Time   NA 139 07/06/2020 1411   NA 145 (H) 04/10/2018 1031   K 3.7 07/06/2020 1411   CL 105 07/06/2020 1411   CO2 25 07/06/2020 1411   BUN 10 07/06/2020 1411   BUN 12 04/10/2018 1031   CREATININE 0.68 07/06/2020 1411   CREATININE 0.73 04/24/2019 1557      Component Value Date/Time   CALCIUM 9.4 07/06/2020 1411   ALKPHOS 70 07/06/2020 1411   AST 31 07/06/2020 1411   ALT 27 07/06/2020 1411   BILITOT 0.4 07/06/2020 1411   BILITOT 0.4 04/10/2018 1031     Colonoscopy 08/30/2020 normal perianal and digital rectal exams, multiple diverticula found in sigmoid colon and descending colon.  Upper GI endoscopy showed 5 cm hiatal hernia, 2 diminutive sessile polyps with no bleeding, pathology from this polyps were hyperplastic polyps.  Rest of the endoscopy without any concerning findings.  RADIOGRAPHIC STUDIES: I have personally reviewed the radiological images as listed and agreed with the  findings in the report. No results found.  All questions were answered. The patient knows to call the clinic with any problems, questions or concerns. I spent 45 minutes in the care of this patient including H and P, review of records, counseling and coordination of care.     Benay Pike, MD 09/08/2020 12:46 PM

## 2020-09-09 ENCOUNTER — Ambulatory Visit (HOSPITAL_COMMUNITY)
Admission: RE | Admit: 2020-09-09 | Discharge: 2020-09-09 | Disposition: A | Discharge status: Home / Self Care | Payer: Medicare PPO | Source: Ambulatory Visit | Attending: Internal Medicine | Admitting: Internal Medicine

## 2020-09-09 DIAGNOSIS — R1310 Dysphagia, unspecified: Secondary | ICD-10-CM | POA: Insufficient documentation

## 2020-09-09 DIAGNOSIS — R059 Cough, unspecified: Secondary | ICD-10-CM | POA: Diagnosis not present

## 2020-09-09 NOTE — Progress Notes (Signed)
Modified Barium Swallow Progress Note  Patient Details  Name: Brandy Dunlap MRN: [id number] Date of Birth: March 14, 1950  Today's Date: 09/09/2020  Modified Barium Swallow completed.  Full report located under Chart Review in the Imaging Section.  Brief recommendations include the following:  Clinical Impression  Pt's oropharyngeal swallowing is WFL. She has good timing, efficiency, and airway protection. No aspiration or source of coughing identified. Would defer any additional f/u to MD.   Swallow Evaluation Recommendations   Recommended Consults: Consider ENT evaluation;Consider esophageal assessment   SLP Diet Recommendations: Regular solids;Thin liquid   Liquid Administration via: Cup;Straw   Medication Administration: Whole meds with liquid   Supervision: Patient able to self feed       Postural Changes: Seated upright at 90 degrees   Oral Care Recommendations: Oral care BID        Osie Bond., M.A. [place] Pager [phone] Office [phone]  09/09/2020,12:23 PM

## 2020-09-14 ENCOUNTER — Telehealth: Payer: Self-pay | Admitting: Hematology and Oncology

## 2020-09-14 NOTE — Telephone Encounter (Signed)
Scheduled per 07/14 los, patient has been called and notified of upcoming appointments. 

## 2020-09-21 ENCOUNTER — Other Ambulatory Visit: Payer: Self-pay

## 2020-09-21 ENCOUNTER — Telehealth: Payer: Self-pay

## 2020-09-21 ENCOUNTER — Inpatient Hospital Stay: Payer: Medicare PPO

## 2020-09-21 VITALS — BP 138/72 | HR 75 | Temp 99.0°F | Resp 18 | Wt 190.5 lb

## 2020-09-21 DIAGNOSIS — Z803 Family history of malignant neoplasm of breast: Secondary | ICD-10-CM | POA: Diagnosis not present

## 2020-09-21 DIAGNOSIS — J4540 Moderate persistent asthma, uncomplicated: Secondary | ICD-10-CM | POA: Diagnosis not present

## 2020-09-21 DIAGNOSIS — Z809 Family history of malignant neoplasm, unspecified: Secondary | ICD-10-CM | POA: Diagnosis not present

## 2020-09-21 DIAGNOSIS — D509 Iron deficiency anemia, unspecified: Secondary | ICD-10-CM

## 2020-09-21 DIAGNOSIS — Z8041 Family history of malignant neoplasm of ovary: Secondary | ICD-10-CM | POA: Diagnosis not present

## 2020-09-21 DIAGNOSIS — Z96649 Presence of unspecified artificial hip joint: Secondary | ICD-10-CM | POA: Diagnosis not present

## 2020-09-21 MED ORDER — PROCHLORPERAZINE MALEATE 10 MG PO TABS: 10.0000 mg | ORAL_TABLET | Freq: Four times a day (QID) | ORAL | 0 refills | Status: DC | PRN

## 2020-09-21 MED ORDER — SODIUM CHLORIDE 0.9 % IV SOLN
Freq: Once | INTRAVENOUS | Status: AC
  Filled 2020-09-21: qty 250

## 2020-09-21 MED ORDER — SODIUM CHLORIDE 0.9 % IV SOLN
200.0000 mg | Freq: Once | INTRAVENOUS | Status: AC
  Administered 2020-09-21: 200 mg via INTRAVENOUS
  Filled 2020-09-21: qty 200

## 2020-09-21 NOTE — Patient Instructions (Signed)
[facility]  Discharge Instructions: Thank you for choosing [facility] to provide your oncology and hematology care.   If you have a lab appointment with the Cancer Center, please go directly to the Cancer Center and check in at the registration area.   Wear comfortable clothing and clothing appropriate for easy access to any Portacath or PICC line.   We strive to give you quality time with your provider. You may need to reschedule your appointment if you arrive late (15 or more minutes).  Arriving late affects you and other patients whose appointments are after yours.  Also, if you miss three or more appointments without notifying the office, you may be dismissed from the clinic at the provider's discretion.      For prescription refill requests, have your pharmacy contact our office and allow 72 hours for refills to be completed.    Today you received the following medication - Venofer      To help prevent nausea and vomiting after your treatment, we encourage you to take your nausea medication as directed.  BELOW ARE SYMPTOMS THAT SHOULD BE REPORTED IMMEDIATELY: *FEVER GREATER THAN 100.4 F (38 C) OR HIGHER *CHILLS OR SWEATING *NAUSEA AND VOMITING THAT IS NOT CONTROLLED WITH YOUR NAUSEA MEDICATION *UNUSUAL SHORTNESS OF BREATH *UNUSUAL BRUISING OR BLEEDING *URINARY PROBLEMS (pain or burning when urinating, or frequent urination) *BOWEL PROBLEMS (unusual diarrhea, constipation, pain near the anus) TENDERNESS IN MOUTH AND THROAT WITH OR WITHOUT PRESENCE OF ULCERS (sore throat, sores in mouth, or a toothache) UNUSUAL RASH, SWELLING OR PAIN  UNUSUAL VAGINAL DISCHARGE OR ITCHING   Items with * indicate a potential emergency and should be followed up as soon as possible or go to the Emergency Department if any problems should occur.  Please show the CHEMOTHERAPY ALERT CARD or IMMUNOTHERAPY ALERT CARD at check-in to the Emergency Department and  triage nurse.  Should you have questions after your visit or need to cancel or reschedule your appointment, please contact [facility]  Dept: [phone]  and follow the prompts.  Office hours are 8:00 a.m. to 4:30 p.m. Monday - Friday. Please note that voicemails left after 4:00 p.m. may not be returned until the following business day.  We are closed weekends and major holidays. You have access to a nurse at all times for urgent questions. Please call the main number to the clinic Dept: [phone] and follow the prompts.   For any non-urgent questions, you may also contact your provider using MyChart. We now offer e-Visits for anyone 18 and older to request care online for non-urgent symptoms. For details visit [email or web address].   Also download the MyChart app! Go to the app store, search "MyChart", open the app, select [facility], and log in with your MyChart username and password.  Due to Covid, a mask is required upon entering the hospital/clinic. If you do not have a mask, one will be given to you upon arrival. For doctor visits, patients may have 1 support person aged 18 or older with them. For treatment visits, patients cannot have anyone with them due to current Covid guidelines and our immunocompromised population.   Iron Sucrose injection What is this medication? IRON SUCROSE (AHY ern SOO krohs) is an iron complex. Iron is used to make healthy red blood cells, which carry oxygen and nutrients throughout the body. This medicine is used to treat iron deficiency anemia in people with chronickidney disease. This medicine may   be used for other purposes; ask your health care provider orpharmacist if you have questions. COMMON BRAND NAME(S): Venofer What should I tell my care team before I take this medication? They need to know if you have any of these conditions: anemia not caused by low iron levels heart disease high levels of iron in the  blood kidney disease liver disease an unusual or allergic reaction to iron, other medicines, foods, dyes, or preservatives pregnant or trying to get pregnant breast-feeding How should I use this medication? This medicine is for infusion into a vein. It is given by a health careprofessional in a hospital or clinic setting. Talk to your pediatrician regarding the use of this medicine in children. While this drug may be prescribed for children as young as 2 years for selectedconditions, precautions do apply. Overdosage: If you think you have taken too much of this medicine contact apoison control center or emergency room at once. NOTE: This medicine is only for you. Do not share this medicine with others. What if I miss a dose? It is important not to miss your dose. Call your doctor or health careprofessional if you are unable to keep an appointment. What may interact with this medication? Do not take this medicine with any of the following medications: deferoxamine dimercaprol other iron products This medicine may also interact with the following medications: chloramphenicol deferasirox This list may not describe all possible interactions. Give your health care provider a list of all the medicines, herbs, non-prescription drugs, or dietary supplements you use. Also tell them if you smoke, drink alcohol, or use illegaldrugs. Some items may interact with your medicine. What should I watch for while using this medication? Visit your doctor or healthcare professional regularly. Tell your doctor or healthcare professional if your symptoms do not start to get better or if theyget worse. You may need blood work done while you are taking this medicine. You may need to follow a special diet. Talk to your doctor. Foods that contain iron include: whole grains/cereals, dried fruits, beans, or peas, leafy greenvegetables, and organ meats (liver, kidney). What side effects may I notice from receiving this  medication? Side effects that you should report to your doctor or health care professionalas soon as possible: allergic reactions like skin rash, itching or hives, swelling of the face, lips, or tongue breathing problems changes in blood pressure cough fast, irregular heartbeat feeling faint or lightheaded, falls fever or chills flushing, sweating, or hot feelings joint or muscle aches/pains seizures swelling of the ankles or feet unusually weak or tired Side effects that usually do not require medical attention (report to yourdoctor or health care professional if they continue or are bothersome): diarrhea feeling achy headache irritation at site where injected nausea, vomiting stomach upset tiredness This list may not describe all possible side effects. Call your doctor for medical advice about side effects. You may report side effects to FDA at1-800-FDA-1088. Where should I keep my medication? This drug is given in a hospital or clinic and will not be stored at home. NOTE: This sheet is a summary. It may not cover all possible information. If you have questions about this medicine, talk to your doctor, pharmacist, orhealth care provider.  2022 Elsevier/Gold Standard (2010-11-23 17:14:35)   

## 2020-09-21 NOTE — Telephone Encounter (Signed)
Nurse received call from patient's daughter stating that the patient appeared to be having an allergic reaction following administration of iron sucrose today. Patient noted to be experiencing nausea, vomiting, diarrhea, and cold chills four hours after her infusion. Daughter noted that patient is currently sleeping and feeling a little better, but requested advice and possible prescription for nausea medicine.  Advised for patient to take benadryl and claritin to help with allergic reaction. Patient advised to attempt to rehydrate and get plenty of rest. Should patient begin to experience any chest pain and/or shortness of breath, patient is to seek emergent medical assistance.  Dr. Chryl Heck aware of the situation and agreed with recommendations. Received verbal orders for compazine 10 mg PO. Prescription sent to preferred pharmacy. Patient's daughter aware and verbalized an understanding of the recommendations provided.  Nurse encouraged daughter to call back with any questions or concerns.

## 2020-09-22 ENCOUNTER — Telehealth: Payer: Self-pay

## 2020-09-22 ENCOUNTER — Other Ambulatory Visit: Payer: Self-pay | Admitting: Hematology and Oncology

## 2020-09-22 NOTE — Progress Notes (Signed)
Premeds added given recent reaction to venofer.  Brandy Dunlap

## 2020-09-22 NOTE — Telephone Encounter (Signed)
Nurse spoke with patient regarding concerns for iron transfusion tomorrow. Patient reports feeling much better after delayed reaction to iron yesterday. Only complaint noted is feeling tired. Informed patient that Dr. Chryl Heck was aware of the situation and encouraged her to still continue with the iron infusions. Pre-medications will be added and given prior to future iron infusions. Patient appreciative of the call and had no additional questions or concerns.

## 2020-09-23 ENCOUNTER — Other Ambulatory Visit: Payer: Self-pay

## 2020-09-23 ENCOUNTER — Inpatient Hospital Stay: Payer: Medicare PPO

## 2020-09-23 VITALS — BP 138/62 | HR 66 | Temp 98.1°F | Resp 18

## 2020-09-23 DIAGNOSIS — Z96649 Presence of unspecified artificial hip joint: Secondary | ICD-10-CM | POA: Diagnosis not present

## 2020-09-23 DIAGNOSIS — Z809 Family history of malignant neoplasm, unspecified: Secondary | ICD-10-CM | POA: Diagnosis not present

## 2020-09-23 DIAGNOSIS — Z8041 Family history of malignant neoplasm of ovary: Secondary | ICD-10-CM | POA: Diagnosis not present

## 2020-09-23 DIAGNOSIS — D509 Iron deficiency anemia, unspecified: Secondary | ICD-10-CM

## 2020-09-23 DIAGNOSIS — Z803 Family history of malignant neoplasm of breast: Secondary | ICD-10-CM | POA: Diagnosis not present

## 2020-09-23 MED ORDER — SODIUM CHLORIDE 0.9 % IV SOLN
Freq: Once | INTRAVENOUS | Status: AC
Start: 2020-09-23 — End: 2020-09-23
  Filled 2020-09-23: qty 250

## 2020-09-23 MED ORDER — FAMOTIDINE 20 MG IN NS 100 ML IVPB
20.0000 mg | Freq: Once | INTRAVENOUS | Status: AC
  Administered 2020-09-23: 20 mg via INTRAVENOUS

## 2020-09-23 MED ORDER — DIPHENHYDRAMINE HCL 25 MG PO CAPS
ORAL_CAPSULE | ORAL | Status: AC
  Filled 2020-09-23: qty 1

## 2020-09-23 MED ORDER — FAMOTIDINE 20 MG IN NS 100 ML IVPB
INTRAVENOUS | Status: AC
  Filled 2020-09-23: qty 100

## 2020-09-23 MED ORDER — SODIUM CHLORIDE 0.9 % IV SOLN
200.0000 mg | Freq: Once | INTRAVENOUS | Status: AC
  Administered 2020-09-23: 200 mg via INTRAVENOUS
  Filled 2020-09-23: qty 200

## 2020-09-23 MED ORDER — DIPHENHYDRAMINE HCL 25 MG PO CAPS
25.0000 mg | ORAL_CAPSULE | Freq: Once | ORAL | Status: AC
  Administered 2020-09-23: 25 mg via ORAL

## 2020-09-23 NOTE — Patient Instructions (Addendum)

## 2020-09-28 ENCOUNTER — Other Ambulatory Visit: Payer: Self-pay

## 2020-09-28 ENCOUNTER — Inpatient Hospital Stay: Payer: Medicare PPO | Attending: Hematology and Oncology

## 2020-09-28 VITALS — BP 149/71 | HR 77 | Temp 99.2°F | Resp 18

## 2020-09-28 DIAGNOSIS — D509 Iron deficiency anemia, unspecified: Secondary | ICD-10-CM | POA: Diagnosis not present

## 2020-09-28 MED ORDER — SODIUM CHLORIDE 0.9 % IV SOLN
200.0000 mg | Freq: Once | INTRAVENOUS | Status: AC
  Administered 2020-09-28: 200 mg via INTRAVENOUS
  Filled 2020-09-28: qty 200

## 2020-09-28 MED ORDER — SODIUM CHLORIDE 0.9 % IV SOLN
Freq: Once | INTRAVENOUS | Status: AC
  Filled 2020-09-28: qty 250

## 2020-09-28 MED ORDER — FAMOTIDINE 20 MG IN NS 100 ML IVPB
20.0000 mg | Freq: Once | INTRAVENOUS | Status: AC
  Administered 2020-09-28: 20 mg via INTRAVENOUS

## 2020-09-28 MED ORDER — FAMOTIDINE 20 MG IN NS 100 ML IVPB
INTRAVENOUS | Status: AC
  Filled 2020-09-28: qty 100

## 2020-09-28 MED ORDER — DIPHENHYDRAMINE HCL 25 MG PO CAPS
ORAL_CAPSULE | ORAL | Status: AC
  Filled 2020-09-28: qty 1

## 2020-09-28 MED ORDER — DIPHENHYDRAMINE HCL 25 MG PO CAPS
25.0000 mg | ORAL_CAPSULE | Freq: Once | ORAL | Status: AC
  Administered 2020-09-28: 25 mg via ORAL

## 2020-09-28 NOTE — Patient Instructions (Signed)
[facility]  Discharge Instructions: Thank you for choosing [facility] to provide your oncology and hematology care.   If you have a lab appointment with the Cancer Center, please go directly to the Cancer Center and check in at the registration area.   Wear comfortable clothing and clothing appropriate for easy access to any Portacath or PICC line.   We strive to give you quality time with your provider. You may need to reschedule your appointment if you arrive late (15 or more minutes).  Arriving late affects you and other patients whose appointments are after yours.  Also, if you miss three or more appointments without notifying the office, you may be dismissed from the clinic at the provider's discretion.      For prescription refill requests, have your pharmacy contact our office and allow 72 hours for refills to be completed.    Today you received the following medication - Venofer      To help prevent nausea and vomiting after your treatment, we encourage you to take your nausea medication as directed.  BELOW ARE SYMPTOMS THAT SHOULD BE REPORTED IMMEDIATELY: *FEVER GREATER THAN 100.4 F (38 C) OR HIGHER *CHILLS OR SWEATING *NAUSEA AND VOMITING THAT IS NOT CONTROLLED WITH YOUR NAUSEA MEDICATION *UNUSUAL SHORTNESS OF BREATH *UNUSUAL BRUISING OR BLEEDING *URINARY PROBLEMS (pain or burning when urinating, or frequent urination) *BOWEL PROBLEMS (unusual diarrhea, constipation, pain near the anus) TENDERNESS IN MOUTH AND THROAT WITH OR WITHOUT PRESENCE OF ULCERS (sore throat, sores in mouth, or a toothache) UNUSUAL RASH, SWELLING OR PAIN  UNUSUAL VAGINAL DISCHARGE OR ITCHING   Items with * indicate a potential emergency and should be followed up as soon as possible or go to the Emergency Department if any problems should occur.  Please show the CHEMOTHERAPY ALERT CARD or IMMUNOTHERAPY ALERT CARD at check-in to the Emergency Department and  triage nurse.  Should you have questions after your visit or need to cancel or reschedule your appointment, please contact [facility]  Dept: [phone]  and follow the prompts.  Office hours are 8:00 a.m. to 4:30 p.m. Monday - Friday. Please note that voicemails left after 4:00 p.m. may not be returned until the following business day.  We are closed weekends and major holidays. You have access to a nurse at all times for urgent questions. Please call the main number to the clinic Dept: [phone] and follow the prompts.   For any non-urgent questions, you may also contact your provider using MyChart. We now offer e-Visits for anyone 18 and older to request care online for non-urgent symptoms. For details visit [email or web address].   Also download the MyChart app! Go to the app store, search "MyChart", open the app, select [facility], and log in with your MyChart username and password.  Due to Covid, a mask is required upon entering the hospital/clinic. If you do not have a mask, one will be given to you upon arrival. For doctor visits, patients may have 1 support person aged 18 or older with them. For treatment visits, patients cannot have anyone with them due to current Covid guidelines and our immunocompromised population.   Iron Sucrose injection What is this medication? IRON SUCROSE (AHY ern SOO krohs) is an iron complex. Iron is used to make healthy red blood cells, which carry oxygen and nutrients throughout the body. This medicine is used to treat iron deficiency anemia in people with chronickidney disease. This medicine may   be used for other purposes; ask your health care provider orpharmacist if you have questions. COMMON BRAND NAME(S): Venofer What should I tell my care team before I take this medication? They need to know if you have any of these conditions: anemia not caused by low iron levels heart disease high levels of iron in the  blood kidney disease liver disease an unusual or allergic reaction to iron, other medicines, foods, dyes, or preservatives pregnant or trying to get pregnant breast-feeding How should I use this medication? This medicine is for infusion into a vein. It is given by a health careprofessional in a hospital or clinic setting. Talk to your pediatrician regarding the use of this medicine in children. While this drug may be prescribed for children as young as 2 years for selectedconditions, precautions do apply. Overdosage: If you think you have taken too much of this medicine contact apoison control center or emergency room at once. NOTE: This medicine is only for you. Do not share this medicine with others. What if I miss a dose? It is important not to miss your dose. Call your doctor or health careprofessional if you are unable to keep an appointment. What may interact with this medication? Do not take this medicine with any of the following medications: deferoxamine dimercaprol other iron products This medicine may also interact with the following medications: chloramphenicol deferasirox This list may not describe all possible interactions. Give your health care provider a list of all the medicines, herbs, non-prescription drugs, or dietary supplements you use. Also tell them if you smoke, drink alcohol, or use illegaldrugs. Some items may interact with your medicine. What should I watch for while using this medication? Visit your doctor or healthcare professional regularly. Tell your doctor or healthcare professional if your symptoms do not start to get better or if theyget worse. You may need blood work done while you are taking this medicine. You may need to follow a special diet. Talk to your doctor. Foods that contain iron include: whole grains/cereals, dried fruits, beans, or peas, leafy greenvegetables, and organ meats (liver, kidney). What side effects may I notice from receiving this  medication? Side effects that you should report to your doctor or health care professionalas soon as possible: allergic reactions like skin rash, itching or hives, swelling of the face, lips, or tongue breathing problems changes in blood pressure cough fast, irregular heartbeat feeling faint or lightheaded, falls fever or chills flushing, sweating, or hot feelings joint or muscle aches/pains seizures swelling of the ankles or feet unusually weak or tired Side effects that usually do not require medical attention (report to yourdoctor or health care professional if they continue or are bothersome): diarrhea feeling achy headache irritation at site where injected nausea, vomiting stomach upset tiredness This list may not describe all possible side effects. Call your doctor for medical advice about side effects. You may report side effects to FDA at1-800-FDA-1088. Where should I keep my medication? This drug is given in a hospital or clinic and will not be stored at home. NOTE: This sheet is a summary. It may not cover all possible information. If you have questions about this medicine, talk to your doctor, pharmacist, orhealth care provider.  2022 Elsevier/Gold Standard (2010-11-23 17:14:35)   

## 2020-09-28 NOTE — Progress Notes (Signed)
Pt stayed for full thirty min observation after iron infusion. VSS throughout and pt was discharged in stable condition, ambulatory to lobby without complaints.

## 2020-09-30 ENCOUNTER — Inpatient Hospital Stay: Payer: Medicare PPO

## 2020-09-30 ENCOUNTER — Other Ambulatory Visit: Payer: Self-pay

## 2020-09-30 ENCOUNTER — Other Ambulatory Visit: Payer: Self-pay | Admitting: Family

## 2020-09-30 VITALS — BP 131/74 | HR 72 | Temp 98.5°F | Resp 18

## 2020-09-30 DIAGNOSIS — D509 Iron deficiency anemia, unspecified: Secondary | ICD-10-CM | POA: Diagnosis not present

## 2020-09-30 MED ORDER — SODIUM CHLORIDE 0.9 % IV SOLN
Freq: Once | INTRAVENOUS | Status: AC
  Filled 2020-09-30: qty 250

## 2020-09-30 MED ORDER — DIPHENHYDRAMINE HCL 25 MG PO CAPS
ORAL_CAPSULE | ORAL | Status: AC
  Filled 2020-09-30: qty 1

## 2020-09-30 MED ORDER — SODIUM CHLORIDE 0.9 % IV SOLN
200.0000 mg | Freq: Once | INTRAVENOUS | Status: AC
  Administered 2020-09-30: 200 mg via INTRAVENOUS
  Filled 2020-09-30: qty 200

## 2020-09-30 MED ORDER — FAMOTIDINE 20 MG IN NS 100 ML IVPB
20.0000 mg | Freq: Once | INTRAVENOUS | Status: AC
Start: 2020-09-30 — End: 2020-09-30
  Administered 2020-09-30: 20 mg via INTRAVENOUS

## 2020-09-30 MED ORDER — FAMOTIDINE 20 MG IN NS 100 ML IVPB
INTRAVENOUS | Status: AC
  Filled 2020-09-30: qty 100

## 2020-09-30 MED ORDER — DIPHENHYDRAMINE HCL 25 MG PO CAPS
25.0000 mg | ORAL_CAPSULE | Freq: Once | ORAL | Status: AC
  Administered 2020-09-30: 25 mg via ORAL

## 2020-09-30 NOTE — Patient Instructions (Signed)
[facility]  Discharge Instructions: Thank you for choosing [facility] to provide your oncology and hematology care.   If you have a lab appointment with the Cancer Center, please go directly to the Cancer Center and check in at the registration area.   Wear comfortable clothing and clothing appropriate for easy access to any Portacath or PICC line.   We strive to give you quality time with your provider. You may need to reschedule your appointment if you arrive late (15 or more minutes).  Arriving late affects you and other patients whose appointments are after yours.  Also, if you miss three or more appointments without notifying the office, you may be dismissed from the clinic at the provider's discretion.      For prescription refill requests, have your pharmacy contact our office and allow 72 hours for refills to be completed.    Today you received the following medication - Venofer      To help prevent nausea and vomiting after your treatment, we encourage you to take your nausea medication as directed.  BELOW ARE SYMPTOMS THAT SHOULD BE REPORTED IMMEDIATELY: *FEVER GREATER THAN 100.4 F (38 C) OR HIGHER *CHILLS OR SWEATING *NAUSEA AND VOMITING THAT IS NOT CONTROLLED WITH YOUR NAUSEA MEDICATION *UNUSUAL SHORTNESS OF BREATH *UNUSUAL BRUISING OR BLEEDING *URINARY PROBLEMS (pain or burning when urinating, or frequent urination) *BOWEL PROBLEMS (unusual diarrhea, constipation, pain near the anus) TENDERNESS IN MOUTH AND THROAT WITH OR WITHOUT PRESENCE OF ULCERS (sore throat, sores in mouth, or a toothache) UNUSUAL RASH, SWELLING OR PAIN  UNUSUAL VAGINAL DISCHARGE OR ITCHING   Items with * indicate a potential emergency and should be followed up as soon as possible or go to the Emergency Department if any problems should occur.  Please show the CHEMOTHERAPY ALERT CARD or IMMUNOTHERAPY ALERT CARD at check-in to the Emergency Department and  triage nurse.  Should you have questions after your visit or need to cancel or reschedule your appointment, please contact [facility]  Dept: [phone]  and follow the prompts.  Office hours are 8:00 a.m. to 4:30 p.m. Monday - Friday. Please note that voicemails left after 4:00 p.m. may not be returned until the following business day.  We are closed weekends and major holidays. You have access to a nurse at all times for urgent questions. Please call the main number to the clinic Dept: [phone] and follow the prompts.   For any non-urgent questions, you may also contact your provider using MyChart. We now offer e-Visits for anyone 18 and older to request care online for non-urgent symptoms. For details visit [email or web address].   Also download the MyChart app! Go to the app store, search "MyChart", open the app, select [facility], and log in with your MyChart username and password.  Due to Covid, a mask is required upon entering the hospital/clinic. If you do not have a mask, one will be given to you upon arrival. For doctor visits, patients may have 1 support person aged 18 or older with them. For treatment visits, patients cannot have anyone with them due to current Covid guidelines and our immunocompromised population.   Iron Sucrose injection What is this medication? IRON SUCROSE (AHY ern SOO krohs) is an iron complex. Iron is used to make healthy red blood cells, which carry oxygen and nutrients throughout the body. This medicine is used to treat iron deficiency anemia in people with chronickidney disease. This medicine may   be used for other purposes; ask your health care provider orpharmacist if you have questions. COMMON BRAND NAME(S): Venofer What should I tell my care team before I take this medication? They need to know if you have any of these conditions: anemia not caused by low iron levels heart disease high levels of iron in the  blood kidney disease liver disease an unusual or allergic reaction to iron, other medicines, foods, dyes, or preservatives pregnant or trying to get pregnant breast-feeding How should I use this medication? This medicine is for infusion into a vein. It is given by a health careprofessional in a hospital or clinic setting. Talk to your pediatrician regarding the use of this medicine in children. While this drug may be prescribed for children as young as 2 years for selectedconditions, precautions do apply. Overdosage: If you think you have taken too much of this medicine contact apoison control center or emergency room at once. NOTE: This medicine is only for you. Do not share this medicine with others. What if I miss a dose? It is important not to miss your dose. Call your doctor or health careprofessional if you are unable to keep an appointment. What may interact with this medication? Do not take this medicine with any of the following medications: deferoxamine dimercaprol other iron products This medicine may also interact with the following medications: chloramphenicol deferasirox This list may not describe all possible interactions. Give your health care provider a list of all the medicines, herbs, non-prescription drugs, or dietary supplements you use. Also tell them if you smoke, drink alcohol, or use illegaldrugs. Some items may interact with your medicine. What should I watch for while using this medication? Visit your doctor or healthcare professional regularly. Tell your doctor or healthcare professional if your symptoms do not start to get better or if theyget worse. You may need blood work done while you are taking this medicine. You may need to follow a special diet. Talk to your doctor. Foods that contain iron include: whole grains/cereals, dried fruits, beans, or peas, leafy greenvegetables, and organ meats (liver, kidney). What side effects may I notice from receiving this  medication? Side effects that you should report to your doctor or health care professionalas soon as possible: allergic reactions like skin rash, itching or hives, swelling of the face, lips, or tongue breathing problems changes in blood pressure cough fast, irregular heartbeat feeling faint or lightheaded, falls fever or chills flushing, sweating, or hot feelings joint or muscle aches/pains seizures swelling of the ankles or feet unusually weak or tired Side effects that usually do not require medical attention (report to yourdoctor or health care professional if they continue or are bothersome): diarrhea feeling achy headache irritation at site where injected nausea, vomiting stomach upset tiredness This list may not describe all possible side effects. Call your doctor for medical advice about side effects. You may report side effects to FDA at1-800-FDA-1088. Where should I keep my medication? This drug is given in a hospital or clinic and will not be stored at home. NOTE: This sheet is a summary. It may not cover all possible information. If you have questions about this medicine, talk to your doctor, pharmacist, orhealth care provider.  2022 Elsevier/Gold Standard (2010-11-23 17:14:35)   

## 2020-10-04 ENCOUNTER — Ambulatory Visit: Payer: Medicare PPO | Admitting: Cardiology

## 2020-10-04 ENCOUNTER — Other Ambulatory Visit: Payer: Self-pay

## 2020-10-04 ENCOUNTER — Encounter: Payer: Self-pay | Admitting: Cardiology

## 2020-10-04 ENCOUNTER — Other Ambulatory Visit: Payer: Self-pay | Admitting: Family

## 2020-10-04 VITALS — BP 128/64 | HR 83 | Ht 63.0 in | Wt 193.0 lb

## 2020-10-04 DIAGNOSIS — R0789 Other chest pain: Secondary | ICD-10-CM

## 2020-10-04 DIAGNOSIS — E785 Hyperlipidemia, unspecified: Secondary | ICD-10-CM | POA: Diagnosis not present

## 2020-10-04 DIAGNOSIS — K219 Gastro-esophageal reflux disease without esophagitis: Secondary | ICD-10-CM

## 2020-10-04 DIAGNOSIS — I10 Essential (primary) hypertension: Secondary | ICD-10-CM | POA: Diagnosis not present

## 2020-10-04 NOTE — Patient Instructions (Signed)

## 2020-10-04 NOTE — Progress Notes (Signed)
Cardiology Office Note:    Date:  10/04/2020   ID:  Brandy Dunlap, DOB [birth year], MRN [id number]  PCP:  [name], NP  Cardiologist:  Jenne Campus, MD    Referring MD: [name], NP   Chief Complaint  Patient presents with   Follow-up    History of Present Illness:    Brandy Dunlap is a 70 y.o. female with past medical history significant for asthma, iron deficiency with anemia, essential hypertension, chest pain.  She did have quite extensive cardiac evaluation done trying to answer the reason for her chest pain so far negative.  She has been receiving iron infusions and started feeling better having much more strength and doing overall very well.  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   Essential hypertension 10/08/2007   Qualifier: Diagnosis of  By: Arnoldo Morale MD, Balinda Quails    GERD (gastroesophageal reflux disease)    History of kidney stones    per 09-02-2018 pre-op eval , suspected kidney stone , to [facility] CT abd on 7-9- for further eval , passive   Hypertension    Infectious disease 05/23/2015   Low back pain    Osteoarthritis    Pneumonia    Pre-diabetes    Prediabetes 07/09/2016   Referred otalgia of left ear 03/26/2016   Sinus trouble     Past Surgical History:  Procedure Laterality Date   CHOLECYSTECTOMY     COLONOSCOPY     COLONOSCOPY W/ BIOPSIES  10/06/2003   LUMBAR FUSION  2008   LUMBAR FUSION     2020    PARTIAL HIP ARTHROPLASTY Right 2009    hip replacement   TONSILLECTOMY     TOTAL HIP ARTHROPLASTY Left 09/10/2018   Procedure: TOTAL HIP ARTHROPLASTY ANTERIOR APPROACH;  Surgeon: Gaynelle Arabian, MD;  Location: [facility];  Service: Orthopedics;  Laterality: Left;   TOTAL KNEE ARTHROPLASTY Right 11/16/2013   Procedure: RIGHT TOTAL KNEE ARTHROPLASTY;  Surgeon: Vickey Huger, MD;  Location: [place];  Service: Orthopedics;  Laterality: Right;   VIDEO BRONCHOSCOPY Bilateral 12/05/2012   Procedure: VIDEO BRONCHOSCOPY WITHOUT FLUORO;  Surgeon: Collene Gobble, MD;  [facility];  Service: Cardiopulmonary;  Laterality: Bilateral;    Current Medications: Current Meds  Medication Sig   amLODipine (NORVASC) 5 MG tablet TAKE 1 TABLET (5 MG TOTAL) BY MOUTH DAILY. (Patient taking differently: Take 5 mg by mouth daily.)   budesonide-formoterol (SYMBICORT) 160-4.5 MCG/ACT inhaler Inhale 2 puffs into the lungs 2 (two) times daily.   Cholecalciferol (VITAMIN D3) 75 MCG (3000 UT) TABS Take 1 tablet by mouth once daily. (Patient taking differently: Take 3,000 Units by mouth daily. Take 1 tablet by mouth once daily.)   dexlansoprazole (DEXILANT) 60 MG capsule TAKE 1 CAPSULE BY MOUTH EVERY DAY (Patient taking differently: Take 60 mg by mouth daily.)   diclofenac sodium (VOLTAREN) 1 %  GEL Apply 2 g topically 4 (four) times daily as needed. (Patient taking differently: Apply 2 g topically 4 (four) times daily as needed (L knee pain).)   EPINEPHrine 0.3 mg/0.3 mL IJ SOAJ injection Inject 0.3 mg into the muscle as needed for anaphylaxis.   omalizumab (XOLAIR) 150 MG injection Inject 150 mg into the skin every 14 (fourteen) days.   prochlorperazine (COMPAZINE) 10 MG tablet Take 1 tablet (10 mg total) by mouth every 6 (six) hours as needed for nausea or vomiting.   traMADol (ULTRAM) 50 MG tablet Take 50-100 mg by mouth every 6 (six) hours as needed for pain.     Allergies:   Pantoprazole sodium   Social History    Socioeconomic History   Marital status: Single    Spouse name: Not on file   Number of children: 1   Years of education: Not on file   Highest education level: Not on file  Occupational History   Occupation: Product manager: [employer]  Tobacco Use   Smoking status: Never   Smokeless tobacco: Never  Vaping Use   Vaping Use: Never used  Substance and Sexual Activity   Alcohol use: No   Drug use: No   Sexual activity: Yes    Partners: Male    Birth control/protection: Post-menopausal  Other Topics Concern   Not on file  Social History Narrative   Divorced   One daughter- lives locally and one grandson   Retired Pharmacist, hospital,  Has masters degree   Enjoys reading, spending time with her grandson   Social Determinants of Radio broadcast assistant Strain: Not on file  Food Insecurity: Not on file  Transportation Needs: Not on file  Physical Activity: Not on file  Stress: Not on file  Social Connections: Not on file     Family History: The patient's family history includes Breast cancer in her maternal aunt; COPD in her father and mother; Cancer in her mother; Emphysema in her father and mother; Heart Problems in her father and mother; Heart disease in her brother and mother; Hyperlipidemia in her mother; Hypertension in her mother; Ovarian cancer in her mother; Stroke in her mother; Thyroid disease in her mother. There is no history of Colon cancer, Esophageal cancer, Rectal cancer, Stomach cancer, or Colon polyps. ROS:   Please see the history of present illness.    All 14 point review of systems negative except as described per history of present illness  EKGs/Labs/Other Studies Reviewed:      Recent Labs: 04/04/2020: TSH 1.24 07/06/2020: ALT 27; BUN 10; Creatinine, Ser 0.68; Potassium 3.7; Sodium 139 08/30/2020: Hemoglobin 11.2; Platelets 374.0  Recent Lipid Panel    Component Value Date/Time   CHOL 204 (H) 04/04/2017 0838   TRIG 130 04/04/2017 0838    HDL 62 04/04/2017 0838   CHOLHDL 3 10/24/2015 1054   VLDL 21.6 10/24/2015 1054   LDLCALC 116 (H) 04/04/2017 0838   LDLDIRECT 149.9 09/11/2010 0908    Physical Exam:    VS:  BP 128/64 (BP Location: Right Arm, Patient Position: Sitting)   Pulse 83   Ht '5\' 3"'$  (1.6 m)   Wt 193 lb (87.5 kg)   SpO2 91%   BMI 34.19 kg/m     Wt Readings from Last 3 Encounters:  10/04/20 193 lb (87.5 kg)  09/21/20 190 lb 8 oz (86.4 kg)  09/08/20 193 lb 2 oz (87.6 kg)     GEN:  Well nourished, well  developed in no acute distress HEENT: Normal NECK: No JVD; No carotid bruits LYMPHATICS: No lymphadenopathy CARDIAC: RRR, no murmurs, no rubs, no gallops RESPIRATORY:  Clear to auscultation without rales, wheezing or rhonchi  ABDOMEN: Soft, non-tender, non-distended MUSCULOSKELETAL:  No edema; No deformity  SKIN: Warm and dry LOWER EXTREMITIES: no swelling NEUROLOGIC:  Alert and oriented x 3 PSYCHIATRIC:  Normal affect   ASSESSMENT:    1. Atypical chest pain   2. Essential (primary) hypertension   3. Gastroesophageal reflux disease, unspecified whether esophagitis present   4. Dyslipidemia    PLAN:    In order of problems listed above:  Atypical chest pain: Stress test analyzed with the patient negative no evidence of ischemia. Dyslipidemia I did review her K PN which show me data from 2019 with LDL of 116 HDL 62 when I calculated 10 years risk based on those data came up to 11.1 which is intermediate risk.  She is scheduled in September to see her primary care physician who will redo her fasting lipid profile and based on that we decided to treat with statin. Gastroesophageal reflux disease) she had quite extensive evaluation done by GI so far unrevealing. I suspect the reason for her fatigue tiredness may be been chest pain was significant anemia as well as iron deficiency overall she is doing better.  Plan is to follow-up in 25month unless she started developing some symptoms  again.   Medication Adjustments/Labs and Tests Ordered: Current medicines are reviewed at length with the patient today.  Concerns regarding medicines are outlined above.  No orders of the defined types were placed in this encounter.  Medication changes: No orders of the defined types were placed in this encounter.   Signed, RPark Liter MD, [facility]8/10/2020 10:56 AM    [place]

## 2020-10-05 ENCOUNTER — Inpatient Hospital Stay: Payer: Medicare PPO

## 2020-10-05 VITALS — BP 126/72 | HR 84 | Temp 98.5°F | Resp 18

## 2020-10-05 DIAGNOSIS — D509 Iron deficiency anemia, unspecified: Secondary | ICD-10-CM | POA: Diagnosis not present

## 2020-10-05 MED ORDER — SODIUM CHLORIDE 0.9 % IV SOLN
200.0000 mg | Freq: Once | INTRAVENOUS | Status: AC
  Administered 2020-10-05: 200 mg via INTRAVENOUS
  Filled 2020-10-05: qty 200

## 2020-10-05 MED ORDER — SODIUM CHLORIDE 0.9 % IV SOLN
Freq: Once | INTRAVENOUS | Status: AC
  Filled 2020-10-05: qty 250

## 2020-10-05 MED ORDER — FAMOTIDINE 20 MG IN NS 100 ML IVPB
20.0000 mg | Freq: Once | INTRAVENOUS | Status: AC
  Administered 2020-10-05: 20 mg via INTRAVENOUS
  Filled 2020-10-05: qty 100

## 2020-10-05 MED ORDER — DIPHENHYDRAMINE HCL 25 MG PO CAPS
25.0000 mg | ORAL_CAPSULE | Freq: Once | ORAL | Status: AC
  Administered 2020-10-05: 25 mg via ORAL
  Filled 2020-10-05: qty 1

## 2020-10-05 NOTE — Patient Instructions (Signed)

## 2020-10-06 DIAGNOSIS — J4540 Moderate persistent asthma, uncomplicated: Secondary | ICD-10-CM | POA: Diagnosis not present

## 2020-10-15 DIAGNOSIS — J0141 Acute recurrent pansinusitis: Secondary | ICD-10-CM | POA: Diagnosis not present

## 2020-10-15 DIAGNOSIS — J40 Bronchitis, not specified as acute or chronic: Secondary | ICD-10-CM | POA: Diagnosis not present

## 2020-10-20 DIAGNOSIS — J4540 Moderate persistent asthma, uncomplicated: Secondary | ICD-10-CM | POA: Diagnosis not present

## 2020-11-03 DIAGNOSIS — J4540 Moderate persistent asthma, uncomplicated: Secondary | ICD-10-CM | POA: Diagnosis not present

## 2020-11-21 ENCOUNTER — Other Ambulatory Visit: Payer: Self-pay | Admitting: Family

## 2020-11-21 DIAGNOSIS — J4540 Moderate persistent asthma, uncomplicated: Secondary | ICD-10-CM | POA: Diagnosis not present

## 2020-11-21 DIAGNOSIS — Z1231 Encounter for screening mammogram for malignant neoplasm of breast: Secondary | ICD-10-CM

## 2020-11-24 DIAGNOSIS — M1712 Unilateral primary osteoarthritis, left knee: Secondary | ICD-10-CM | POA: Diagnosis not present

## 2020-11-28 ENCOUNTER — Other Ambulatory Visit: Payer: Self-pay | Admitting: Family

## 2020-12-07 ENCOUNTER — Other Ambulatory Visit: Payer: Self-pay

## 2020-12-07 DIAGNOSIS — D509 Iron deficiency anemia, unspecified: Secondary | ICD-10-CM

## 2020-12-08 ENCOUNTER — Encounter: Payer: Self-pay | Admitting: Hematology and Oncology

## 2020-12-08 ENCOUNTER — Inpatient Hospital Stay: Payer: Medicare PPO | Attending: Hematology and Oncology

## 2020-12-08 ENCOUNTER — Telehealth: Payer: Self-pay | Admitting: *Deleted

## 2020-12-08 ENCOUNTER — Inpatient Hospital Stay: Payer: Medicare PPO | Admitting: Hematology and Oncology

## 2020-12-08 ENCOUNTER — Other Ambulatory Visit: Payer: Self-pay

## 2020-12-08 VITALS — BP 146/61 | HR 81 | Temp 98.7°F | Resp 18 | Wt 193.2 lb

## 2020-12-08 DIAGNOSIS — J4540 Moderate persistent asthma, uncomplicated: Secondary | ICD-10-CM | POA: Diagnosis not present

## 2020-12-08 DIAGNOSIS — D509 Iron deficiency anemia, unspecified: Secondary | ICD-10-CM

## 2020-12-08 LAB — CBC WITH DIFFERENTIAL (CANCER CENTER ONLY)
Abs Immature Granulocytes: 0.03 10*3/uL (ref 0.00–0.07)
Basophils Absolute: 0.1 10*3/uL (ref 0.0–0.1)
Basophils Relative: 1 %
Eosinophils Absolute: 0.5 10*3/uL (ref 0.0–0.5)
Eosinophils Relative: 6 %
HCT: 39.6 % (ref 36.0–46.0)
Hemoglobin: 13.3 g/dL (ref 12.0–15.0)
Immature Granulocytes: 0 %
Lymphocytes Relative: 38 %
Lymphs Abs: 3 10*3/uL (ref 0.7–4.0)
MCH: 29.1 pg (ref 26.0–34.0)
MCHC: 33.6 g/dL (ref 30.0–36.0)
MCV: 86.7 fL (ref 80.0–100.0)
Monocytes Absolute: 0.5 10*3/uL (ref 0.1–1.0)
Monocytes Relative: 6 %
Neutro Abs: 3.7 10*3/uL (ref 1.7–7.7)
Neutrophils Relative %: 49 %
Platelet Count: 255 10*3/uL (ref 150–400)
RBC: 4.57 MIL/uL (ref 3.87–5.11)
RDW: 19.7 % — ABNORMAL HIGH (ref 11.5–15.5)
WBC Count: 7.9 10*3/uL (ref 4.0–10.5)
nRBC: 0 % (ref 0.0–0.2)

## 2020-12-08 LAB — CMP (CANCER CENTER ONLY)
ALT: 49 U/L — ABNORMAL HIGH (ref 0–44)
AST: 52 U/L — ABNORMAL HIGH (ref 15–41)
Albumin: 4.2 g/dL (ref 3.5–5.0)
Alkaline Phosphatase: 64 U/L (ref 38–126)
Anion gap: 7 (ref 5–15)
BUN: 11 mg/dL (ref 8–23)
CO2: 26 mmol/L (ref 22–32)
Calcium: 9.1 mg/dL (ref 8.9–10.3)
Chloride: 104 mmol/L (ref 98–111)
Creatinine: 0.68 mg/dL (ref 0.44–1.00)
GFR, Estimated: >60 mL/min (ref >60)
Glucose, Bld: 126 mg/dL — ABNORMAL HIGH (ref 70–99)
Potassium: 3.6 mmol/L (ref 3.5–5.1)
Sodium: 137 mmol/L (ref 135–145)
Total Bilirubin: 0.4 mg/dL (ref 0.3–1.2)
Total Protein: 7.1 g/dL (ref 6.5–8.1)

## 2020-12-08 NOTE — Telephone Encounter (Signed)
-----   Message from Benay Pike, MD sent at 12/08/2020  3:35 PM EDT ----- Please let the patient know that her LFT's are elevated compared to last time, this could be signs of fatty liver, unrelated to her iron deficiency. I would recommend she contact her PCP about this.  Thanks,

## 2020-12-08 NOTE — Progress Notes (Signed)
[facility] CONSULT NOTE  Patient Care Team: [name], NP as PCP - General (Internal Medicine)  CHIEF COMPLAINTS/PURPOSE OF CONSULTATION:  IDA  ASSESSMENT & PLAN:   This is a very pleasant 70 year old female patient with hypertension, iron deficiency who was referred to hematology for the same.  Since last visit, she received intravenous Venofer which she tolerated well except for severe nausea.  After addition of premedication, she tolerated rest of the cycles well.  She also had endoscopy and colonoscopy which did not show any evidence for malignancy.  Physical examination today quite unremarkable.  She feels very well, more energetic, no pica and is able to do a lot of chores. Labs from today show significant improvement in hemoglobin.  Iron panel and ferritin are pending.   At this time I have recommended that she come back to clinic for follow-up in about 6 months with repeat labs. She was encouraged to contact us with any new questions or concerns.  Age-appropriate cancer screening discussed.  Thank you for consulting [name] in the care of this patient.  Please do not hesitate to contact us with any additional questions or concerns.  HISTORY OF PRESENTING ILLNESS:  Brandy Dunlap 70 y.o. female is here because of IDA.  This is a very pleasant 70 year old female patient with past medical history significant for hip replacement, iron deficiency anemia referred to hematology for evaluation of iron deficiency anemia.  Since last visit, she received intravenous iron.  She had some nausea with IV iron otherwise tolerated it well.  She feels much more energetic, has been exercising, has been traveling, denies any more pica.  She overall feels much better.  She denies any change in her bowel habits.  She has a mammogram scheduled next month. Rest of the pertinent 10 point ROS reviewed and negative.   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   Essential hypertension 10/08/2007   Qualifier: Diagnosis of  By: Arnoldo Morale MD, Balinda Quails    GERD (gastroesophageal reflux disease)    History of kidney stones    per 09-02-2018 pre-op eval , suspected kidney stone , to [facility] CT abd on 7-9- for further eval , passive   Hypertension    Infectious disease 05/23/2015   Low back pain    Osteoarthritis    Pneumonia    Pre-diabetes    Prediabetes 07/09/2016   Referred otalgia of left ear 03/26/2016   Sinus trouble     SURGICAL HISTORY: Past Surgical History:  Procedure Laterality Date   CHOLECYSTECTOMY     COLONOSCOPY     COLONOSCOPY W/ BIOPSIES  10/06/2003   LUMBAR FUSION  2008   LUMBAR FUSION     2020   PARTIAL HIP ARTHROPLASTY Right 2009    hip replacement   TONSILLECTOMY     TOTAL HIP ARTHROPLASTY Left  09/10/2018   Procedure: TOTAL HIP ARTHROPLASTY ANTERIOR APPROACH;  Surgeon: Gaynelle Arabian, MD;  [facility];  Service: Orthopedics;  Laterality: Left;   TOTAL KNEE ARTHROPLASTY Right 11/16/2013   Procedure: RIGHT TOTAL KNEE ARTHROPLASTY;  Surgeon: Vickey Huger, MD;  Location: [place];  Service: Orthopedics;  Laterality: Right;   VIDEO BRONCHOSCOPY Bilateral 12/05/2012   Procedure: VIDEO BRONCHOSCOPY WITHOUT FLUORO;  Surgeon: Collene Gobble, MD;  [facility];  Service: Cardiopulmonary;  Laterality: Bilateral;    SOCIAL HISTORY: Social History   Socioeconomic History   Marital status: Single     Spouse name: Not on file   Number of children: 1   Years of education: Not on file   Highest education level: Not on file  Occupational History   Occupation: Product manager: [employer]  Tobacco Use   Smoking status: Never   Smokeless tobacco: Never  Vaping Use   Vaping Use: Never used  Substance and Sexual Activity   Alcohol use: No   Drug use: No   Sexual activity: Yes    Partners: Male    Birth control/protection: Post-menopausal  Other Topics Concern   Not on file  Social History Narrative   Divorced   One daughter- lives locally and one grandson   Retired Pharmacist, hospital,  Has masters degree   Enjoys reading, spending time with her grandson   Social Determinants of Radio broadcast assistant Strain: Not on file  Food Insecurity: Not on file  Transportation Needs: Not on file  Physical Activity: Not on file  Stress: Not on file  Social Connections: Not on file  Intimate Partner Violence: Not on file    FAMILY HISTORY: Family History  Problem Relation Age of Onset   COPD Mother    Heart disease Mother    Ovarian cancer Mother    Emphysema Mother    Hypertension Mother    Hyperlipidemia Mother    Heart Problems Mother    Stroke Mother    Thyroid disease Mother    Cancer Mother    COPD Father        died at age 23   Emphysema Father    Heart Problems Father    Heart disease Brother        stent with 90%   Breast cancer Maternal Aunt    Colon cancer Neg Hx    Esophageal cancer Neg Hx    Rectal cancer Neg Hx    Stomach cancer Neg Hx    Colon polyps Neg Hx     ALLERGIES:  is allergic to pantoprazole sodium.  MEDICATIONS:  Current Outpatient Medications  Medication Sig Dispense Refill   amLODipine (NORVASC) 5 MG tablet Take 1 tablet (5 mg total) by mouth daily. 90 tablet 0   budesonide-formoterol (SYMBICORT) 160-4.5 MCG/ACT inhaler Inhale 2 puffs into the lungs 2 (two) times daily.     Cholecalciferol (VITAMIN D3) 75 MCG (3000 UT) TABS  Take 1 tablet by mouth once daily. (Patient taking differently: Take 3,000 Units by mouth daily. Take 1 tablet by mouth once daily.) 90 tablet 1   dexlansoprazole (DEXILANT) 60 MG capsule TAKE 1 CAPSULE BY MOUTH EVERY DAY (Patient taking differently: Take 60 mg by mouth daily.) 90 capsule 1   diclofenac sodium (VOLTAREN) 1 % GEL Apply 2 g topically 4 (four) times daily as needed. (Patient taking differently: Apply 2 g topically 4 (four) times daily as needed (L knee pain).) 100 g 3  EPINEPHrine 0.3 mg/0.3 mL IJ SOAJ injection Inject 0.3 mg into the muscle as needed for anaphylaxis.     omalizumab (XOLAIR) 150 MG injection Inject 150 mg into the skin every 14 (fourteen) days.     prochlorperazine (COMPAZINE) 10 MG tablet Take 1 tablet (10 mg total) by mouth every 6 (six) hours as needed for nausea or vomiting. 6 tablet 0   traMADol (ULTRAM) 50 MG tablet Take 50-100 mg by mouth every 6 (six) hours as needed for pain.     No current facility-administered medications for this visit.    PHYSICAL EXAMINATION:  ECOG PERFORMANCE STATUS: 0 - Asymptomatic  Vitals:   12/08/20 1246  BP: (!) 146/61  Pulse: 81  Resp: 18  Temp: 98.7 F (37.1 C)  SpO2: 95%   Filed Weights   12/08/20 1246  Weight: 193 lb 3 oz (87.6 kg)    GENERAL:alert, no distress and comfortable SKIN: skin color, texture, turgor are normal, no rashes or significant lesions EYES: normal, conjunctiva are pink and non-injected, sclera clear OROPHARYNX:no exudate, no erythema and lips, buccal mucosa, and tongue normal  NECK: supple, thyroid normal size, non-tender, without nodularity LYMPH:  no palpable lymphadenopathy in the cervical, axillary or inguinal LUNGS: clear to auscultation and percussion with normal breathing effort HEART: regular rate & rhythm and no murmurs and no lower extremity edema ABDOMEN:abdomen soft, non-tender and normal bowel sounds Musculoskeletal:no cyanosis of digits and no clubbing  PSYCH: alert &  oriented x 3 with fluent speech NEURO: no focal motor/sensory deficits  LABORATORY DATA:  I have reviewed the data as listed Lab Results  Component Value Date   WBC 7.9 12/08/2020   HGB 13.3 12/08/2020   HCT 39.6 12/08/2020   MCV 86.7 12/08/2020   PLT 255 12/08/2020     Chemistry      Component Value Date/Time   NA 139 07/06/2020 1411   NA 145 (H) 04/10/2018 1031   K 3.7 07/06/2020 1411   CL 105 07/06/2020 1411   CO2 25 07/06/2020 1411   BUN 10 07/06/2020 1411   BUN 12 04/10/2018 1031   CREATININE 0.68 07/06/2020 1411   CREATININE 0.73 04/24/2019 1557      Component Value Date/Time   CALCIUM 9.4 07/06/2020 1411   ALKPHOS 70 07/06/2020 1411   AST 31 07/06/2020 1411   ALT 27 07/06/2020 1411   BILITOT 0.4 07/06/2020 1411   BILITOT 0.4 04/10/2018 1031     Colonoscopy 08/30/2020 normal perianal and digital rectal exams, multiple diverticula found in sigmoid colon and descending colon.  Upper GI endoscopy showed 5 cm hiatal hernia, 2 diminutive sessile polyps with no bleeding, pathology from this polyps were hyperplastic polyps.  Rest of the endoscopy without any concerning findings.  RADIOGRAPHIC STUDIES: I have personally reviewed the radiological images as listed and agreed with the findings in the report. No results found.  All questions were answered. The patient knows to call the clinic with any problems, questions or concerns. I spent 45 minutes in the care of this patient including H and P, review of records, counseling and coordination of care.     Benay Pike, MD 12/08/2020 12:59 PM

## 2020-12-08 NOTE — Telephone Encounter (Signed)
Per Dr.Iruku, called pt with message below. Pt verbalized understanding  

## 2020-12-09 LAB — IRON AND TIBC
Iron: 85 ug/dL (ref 28–170)
Saturation Ratios: 20 % (ref 10.4–31.8)
TIBC: 432 ug/dL (ref 250–450)
UIBC: 347 ug/dL

## 2020-12-09 LAB — FERRITIN: Ferritin: 165 ng/mL (ref 11–307)

## 2020-12-12 ENCOUNTER — Encounter: Payer: Self-pay | Admitting: Hematology and Oncology

## 2020-12-15 ENCOUNTER — Other Ambulatory Visit: Payer: Self-pay

## 2020-12-15 ENCOUNTER — Ambulatory Visit: Payer: Medicare PPO | Admitting: Podiatry

## 2020-12-15 ENCOUNTER — Ambulatory Visit (INDEPENDENT_AMBULATORY_CARE_PROVIDER_SITE_OTHER): Payer: Medicare PPO

## 2020-12-15 DIAGNOSIS — M778 Other enthesopathies, not elsewhere classified: Secondary | ICD-10-CM | POA: Diagnosis not present

## 2020-12-15 MED ORDER — TRIAMCINOLONE ACETONIDE 40 MG/ML IJ SUSP
20.0000 mg | Freq: Once | INTRAMUSCULAR | Status: AC
  Administered 2020-12-15: 20 mg

## 2020-12-15 NOTE — Progress Notes (Signed)
Subjective:  Patient ID: Brandy Dunlap, female    DOB: Jan 20, 1951,  MRN: [id number] HPI Chief Complaint  Patient presents with   Foot Pain    New patient right foot pain follow up.    70 y.o. female presents with the above complaint.   ROS: Denies fever chills nausea vomiting muscle aches pains calf pain back pain chest pain shortness of breath.  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   Essential hypertension 10/08/2007   Qualifier: Diagnosis of  By: Arnoldo Morale MD, Balinda Quails    GERD (gastroesophageal reflux disease)    History of kidney stones    per 09-02-2018 pre-op eval , suspected kidney stone , to [facility] CT abd on 7-9- for further eval , passive   Hypertension    Infectious disease 05/23/2015   Low back pain    Osteoarthritis    Pneumonia    Pre-diabetes    Prediabetes 07/09/2016   Referred otalgia of left ear 03/26/2016   Sinus trouble    Past Surgical History:  Procedure Laterality Date   CHOLECYSTECTOMY     COLONOSCOPY     COLONOSCOPY W/ BIOPSIES  10/06/2003   LUMBAR FUSION  2008   LUMBAR FUSION     2020   PARTIAL HIP ARTHROPLASTY Right 2009    hip replacement   TONSILLECTOMY     TOTAL HIP ARTHROPLASTY Left 09/10/2018   Procedure: TOTAL HIP ARTHROPLASTY ANTERIOR APPROACH;  Surgeon: Gaynelle Arabian, MD;  [facility];  Service: Orthopedics;  Laterality: Left;   TOTAL KNEE ARTHROPLASTY Right 11/16/2013   Procedure: RIGHT TOTAL KNEE  ARTHROPLASTY;  Surgeon: Vickey Huger, MD;  Location: [place];  Service: Orthopedics;  Laterality: Right;   VIDEO BRONCHOSCOPY Bilateral 12/05/2012   Procedure: VIDEO BRONCHOSCOPY WITHOUT FLUORO;  Surgeon: Collene Gobble, MD;  [facility];  Service: Cardiopulmonary;  Laterality: Bilateral;    Current Outpatient Medications:    amLODipine (NORVASC) 5 MG tablet, Take 1 tablet (5 mg total) by mouth daily., Disp: 90 tablet, Rfl: 0   budesonide-formoterol (SYMBICORT) 160-4.5 MCG/ACT inhaler, Inhale 2 puffs into the lungs 2 (two) times daily., Disp: , Rfl:    Cholecalciferol (VITAMIN D3) 75 MCG (3000 UT) TABS, Take 1 tablet by mouth once daily. (Patient taking differently: Take 3,000 Units by mouth daily. Take 1 tablet by mouth once daily.), Disp: 90 tablet, Rfl: 1   dexlansoprazole (DEXILANT) 60 MG capsule, TAKE 1 CAPSULE BY MOUTH EVERY DAY (Patient taking differently: Take 60 mg by mouth daily.), Disp: 90 capsule, Rfl: 1   diclofenac sodium (VOLTAREN) 1 % GEL, Apply 2 g topically 4 (four) times daily as needed. (Patient taking differently: Apply 2 g topically 4 (four) times daily as needed (L knee pain).), Disp: 100 g, Rfl: 3   EPINEPHrine 0.3 mg/0.3 mL IJ SOAJ injection, Inject 0.3 mg into the muscle as needed for  anaphylaxis., Disp: , Rfl:    omalizumab (XOLAIR) 150 MG injection, Inject 150 mg into the skin every 14 (fourteen) days., Disp: , Rfl:    traMADol (ULTRAM) 50 MG tablet, Take 50-100 mg by mouth every 6 (six) hours as needed for pain., Disp: , Rfl:   Allergies  Allergen Reactions   Pantoprazole Sodium Palpitations   Review of Systems Objective:  There were no vitals filed for this visit.  General: Well developed, nourished, in no acute distress, alert and oriented x3   Dermatological: Skin is warm, dry and supple bilateral. Nails x 10 are well maintained; remaining integument appears unremarkable at this time. There are no open sores, no preulcerative lesions, no rash or signs  of infection present.  Vascular: Dorsalis Pedis artery and Posterior Tibial artery pedal pulses are 2/4 bilateral with immedate capillary fill time. Pedal hair growth present. No varicosities and no lower extremity edema present bilateral.   Neruologic: Grossly intact via light touch bilateral. Vibratory intact via tuning fork bilateral. Protective threshold with Semmes Wienstein monofilament intact to all pedal sites bilateral. Patellar and Achilles deep tendon reflexes 2+ bilateral. No Babinski or clonus noted bilateral.   Musculoskeletal: No gross boney pedal deformities bilateral. No pain, crepitus, or limitation noted with foot and ankle range of motion bilateral. Muscular strength 5/5 in all groups tested bilateral.  Pain within the first intermetatarsal space particularly on plantarflexion of the second metatarsophalangeal joint.  End range of motion is particularly tender.    Gait: Unassisted, Nonantalgic.    Radiographs:  Radiographs taken today demonstrate osseously mature individual plantarflexed elongated second metatarsal.  No acute findings.  Assessment & Plan:   Assessment: Capsulitis second metatarsophalangeal joint right.  Plan: Injected the first intermetatarsal space today and periarticular area around the second metatarsal phalangeal joint of the right foot.  Follow-up with her as needed     Rita Vialpando T. [place], [place]

## 2020-12-19 ENCOUNTER — Encounter: Payer: Self-pay | Admitting: Family

## 2020-12-20 ENCOUNTER — Encounter: Payer: Self-pay | Admitting: Internal Medicine

## 2020-12-20 ENCOUNTER — Telehealth (INDEPENDENT_AMBULATORY_CARE_PROVIDER_SITE_OTHER): Payer: Medicare PPO | Admitting: Internal Medicine

## 2020-12-20 ENCOUNTER — Other Ambulatory Visit: Payer: Self-pay

## 2020-12-20 VITALS — Ht 63.0 in | Wt 190.0 lb

## 2020-12-20 DIAGNOSIS — J111 Influenza due to unidentified influenza virus with other respiratory manifestations: Secondary | ICD-10-CM | POA: Diagnosis not present

## 2020-12-20 MED ORDER — HYDROCODONE BIT-HOMATROP MBR 5-1.5 MG/5ML PO SOLN
5.0000 mL | Freq: Every evening | ORAL | 0 refills | Status: DC | PRN
Start: 2020-12-20 — End: 2020-12-29

## 2020-12-20 MED ORDER — OSELTAMIVIR PHOSPHATE 75 MG PO CAPS: 75.0000 mg | ORAL_CAPSULE | Freq: Two times a day (BID) | ORAL | 0 refills | Status: DC

## 2020-12-20 NOTE — Progress Notes (Signed)
Subjective:    Patient ID: Brandy Dunlap, female    DOB: 1950/05/02, 70 y.o.   MRN: [id number]  DOS:  12/20/2020 Type of visit - description: Virtual Visit via Video Note  I connected with the above patient  by a video enabled telemedicine application and verified that I am speaking with the correct person using two identifiers.   THIS ENCOUNTER IS A VIRTUAL VISIT DUE TO COVID-19 - PATIENT WAS NOT SEEN IN THE OFFICE. PATIENT HAS CONSENTED TO VIRTUAL VISIT / TELEMEDICINE VISIT   Location of patient: home  Location of provider: office  Persons participating in the virtual visit: patient, provider   I discussed the limitations of evaluation and management by telemedicine and the availability of in person appointments. The patient expressed understanding and agreed to proceed.  Acute Symptoms a started 2 days ago: Malaise, chills, headache, fever on and off, T-max 100. She was exposed to one of her grandchildren who had influenza documented by a test.  She also developed cough, at times intense, not responding well to OTCs. Mild sputum production, small amounts, very  thick. + Myalgias throughout. Had diarrhea but no nausea or vomiting.    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   Essential hypertension 10/08/2007   Qualifier: Diagnosis of  By: Arnoldo Morale MD, Balinda Quails    GERD (gastroesophageal reflux disease)    History  of kidney stones    per 09-02-2018 pre-op eval , suspected kidney stone , to [facility] CT abd on 7-9- for further eval , passive   Hypertension    Infectious disease 05/23/2015   Low back pain    Osteoarthritis    Pneumonia    Pre-diabetes    Prediabetes 07/09/2016   Referred otalgia of left ear 03/26/2016   Sinus trouble     Past Surgical History:  Procedure Laterality Date   CHOLECYSTECTOMY     COLONOSCOPY     COLONOSCOPY W/ BIOPSIES  10/06/2003   LUMBAR FUSION  2008   LUMBAR FUSION     2020   PARTIAL HIP ARTHROPLASTY Right 2009    hip replacement   TONSILLECTOMY     TOTAL HIP ARTHROPLASTY Left 09/10/2018   Procedure: TOTAL HIP ARTHROPLASTY ANTERIOR APPROACH;  Surgeon: Gaynelle Arabian, MD;  Location: [facility];  Service: Orthopedics;  Laterality: Left;   TOTAL KNEE ARTHROPLASTY Right 11/16/2013   Procedure: RIGHT TOTAL KNEE ARTHROPLASTY;  Surgeon: Vickey Huger, MD;  Location: [place];  Service: Orthopedics;  Laterality: Right;   VIDEO BRONCHOSCOPY Bilateral 12/05/2012   Procedure: VIDEO BRONCHOSCOPY WITHOUT FLUORO;  Surgeon: Collene Gobble, MD;  Location: [facility];  Service: Cardiopulmonary;  Laterality: Bilateral;    Allergies as of 12/20/2020       Reactions   Pantoprazole Sodium Palpitations        Medication List  Accurate as of December 20, 2020 11:59 PM. If you have any questions, ask your nurse or doctor.          amLODipine 5 MG tablet Commonly known as: NORVASC Take 1 tablet (5 mg total) by mouth daily.   budesonide-formoterol 160-4.5 MCG/ACT inhaler Commonly known as: SYMBICORT Inhale 2 puffs into the lungs 2 (two) times daily.   Dexilant 60 MG capsule Generic drug: dexlansoprazole TAKE 1 CAPSULE BY MOUTH EVERY DAY What changed: how much to take   diclofenac sodium 1 % Gel Commonly known as: VOLTAREN Apply 2 g topically 4 (four) times daily as needed. What changed: reasons to take this   EPINEPHrine 0.3 mg/0.3 mL Soaj injection Commonly  known as: EPI-PEN Inject 0.3 mg into the muscle as needed for anaphylaxis.   HYDROcodone bit-homatropine 5-1.5 MG/5ML syrup Commonly known as: HYCODAN Take 5 mLs by mouth at bedtime as needed for cough. Started by: Kathlene November, MD   omalizumab 150 MG injection Commonly known as: XOLAIR Inject 150 mg into the skin every 14 (fourteen) days.   oseltamivir 75 MG capsule Commonly known as: Tamiflu Take 1 capsule (75 mg total) by mouth 2 (two) times daily. Started by: Kathlene November, MD   traMADol 50 MG tablet Commonly known as: ULTRAM Take 50-100 mg by mouth every 6 (six) hours as needed for pain.   Vitamin D3 75 MCG (3000 UT) Tabs Take 1 tablet by mouth once daily. What changed:  how much to take how to take this when to take this           Objective:   Physical Exam Ht 5\' 3"  (1.6 m)   Wt 190 lb (86.2 kg)   BMI 33.66 kg/m  This is a virtual video visit, alert oriented x3, in no distress, no cough noted, speaking in complete sentences.    Assessment    70 year old female, PMH includes hypertension, asthma, previous COVID vaccines, no flu shot this year.  Presents with:   Viral syndrome: As described above, in the context of being exposed to a patient with influenza. Had a negative COVID test yesterday. Suspect influenza on clinical grounds, will recommend the following: Tamiflu, Mucinex, rest, fluids. If cough is persisting, okay to take hydrocodone, Rx sent. Call if not gradually better Call if symptoms severe. She verbalized understanding     I discussed the assessment and treatment plan with the patient. The patient was provided an opportunity to ask questions and all were answered. The patient agreed with the plan and demonstrated an understanding of the instructions.   The patient was advised to call back or seek an in-person evaluation if the symptoms worsen or if the condition fails to improve as anticipated.

## 2020-12-28 DIAGNOSIS — J4540 Moderate persistent asthma, uncomplicated: Secondary | ICD-10-CM | POA: Diagnosis not present

## 2020-12-29 ENCOUNTER — Ambulatory Visit: Payer: Medicare PPO | Admitting: Internal Medicine

## 2020-12-29 ENCOUNTER — Other Ambulatory Visit: Payer: Self-pay

## 2020-12-29 VITALS — BP 156/82 | HR 94 | Temp 98.6°F | Resp 20 | Ht 63.0 in | Wt 186.0 lb

## 2020-12-29 DIAGNOSIS — J019 Acute sinusitis, unspecified: Secondary | ICD-10-CM

## 2020-12-29 MED ORDER — HYDROCODONE BIT-HOMATROP MBR 5-1.5 MG/5ML PO SOLN: 5.0000 mL | Freq: Every evening | ORAL | 0 refills | Status: DC | PRN

## 2020-12-29 MED ORDER — AMOXICILLIN 875 MG PO TABS: 875.0000 mg | ORAL_TABLET | Freq: Two times a day (BID) | ORAL | 0 refills | Status: DC

## 2020-12-29 MED ORDER — PREDNISONE 10 MG PO TABS: ORAL_TABLET | ORAL | 0 refills | Status: DC

## 2020-12-29 NOTE — Patient Instructions (Signed)
  For cough:  Take Mucinex DM twice a day as needed until better Also day hydrocodone syrup at bedtime  For nasal congestion: -Flonase (generics are okay and less expensive): 2 nasal sprays on each side of the nose in the morning until you feel better -OTC Astepro 2 nasal sprays on each side of the nose twice daily until better -Avoid decongestants such as  Pseudoephedrine or phenylephrine   Prednisone for few days  Take the antibiotic as prescribed  (Amoxicillin)  Call if not gradually better over the next  10 days  Call anytime if the symptoms are severe

## 2020-12-29 NOTE — Progress Notes (Signed)
Subjective:    Patient ID: Brandy Dunlap, female    DOB: August 01, 1950, 70 y.o.   MRN: [id number]  DOS:  12/29/2020 Type of visit - description: Acute  Was seen virtually several days ago after she was exposed to influenza, she was having symptoms, was Rx Tamiflu and a cough suppressant. She felt better but in the last few days  she noticed even more sinus congestion, thick nasal discharge, mucus pooling on her throat. Lost her taste but not her smell. Cough is severe.  Denies fever chills Chest pain only with cough No nausea or vomiting No myalgias  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   Essential hypertension 10/08/2007   Qualifier: Diagnosis of  By: Arnoldo Morale MD, Balinda Quails    GERD (gastroesophageal reflux disease)    History of kidney stones    per 09-02-2018 pre-op eval , suspected kidney stone , to [facility] CT abd on 7-9- for further eval , passive   Hypertension    Infectious disease 05/23/2015   Low back pain    Osteoarthritis    Pneumonia    Pre-diabetes    Prediabetes 07/09/2016   Referred otalgia of left ear 03/26/2016   Sinus trouble     Past Surgical History:  Procedure Laterality Date   CHOLECYSTECTOMY     COLONOSCOPY     COLONOSCOPY W/ BIOPSIES  10/06/2003   LUMBAR FUSION  2008   LUMBAR FUSION     2020   PARTIAL HIP ARTHROPLASTY Right 2009    hip replacement   TONSILLECTOMY     TOTAL HIP  ARTHROPLASTY Left 09/10/2018   Procedure: TOTAL HIP ARTHROPLASTY ANTERIOR APPROACH;  Surgeon: Gaynelle Arabian, MD;  Location: [facility];  Service: Orthopedics;  Laterality: Left;   TOTAL KNEE ARTHROPLASTY Right 11/16/2013   Procedure: RIGHT TOTAL KNEE ARTHROPLASTY;  Surgeon: Vickey Huger, MD;  Location: [place];  Service: Orthopedics;  Laterality: Right;   VIDEO BRONCHOSCOPY Bilateral 12/05/2012   Procedure: VIDEO BRONCHOSCOPY WITHOUT FLUORO;  Surgeon: Collene Gobble, MD;  [facility];  Service: Cardiopulmonary;  Laterality: Bilateral;    Allergies as of 12/29/2020       Reactions   Pantoprazole Sodium Palpitations        Medication List        Accurate as of December 29, 2020 11:02 AM. If you have any questions, ask your nurse or doctor.          amLODipine 5 MG tablet Commonly known as: NORVASC Take 1 tablet (5 mg total) by mouth daily.   budesonide-formoterol 160-4.5 MCG/ACT inhaler Commonly known as: SYMBICORT Inhale 2 puffs into the lungs 2 (two) times daily.   Dexilant 60 MG capsule Generic drug: dexlansoprazole TAKE 1 CAPSULE BY MOUTH EVERY DAY What changed: how much to take   diclofenac sodium 1 %  Gel Commonly known as: VOLTAREN Apply 2 g topically 4 (four) times daily as needed. What changed: reasons to take this   EPINEPHrine 0.3 mg/0.3 mL Soaj injection Commonly known as: EPI-PEN Inject 0.3 mg into the muscle as needed for anaphylaxis.   HYDROcodone bit-homatropine 5-1.5 MG/5ML syrup Commonly known as: HYCODAN Take 5 mLs by mouth at bedtime as needed for cough.   omalizumab 150 MG injection Commonly known as: XOLAIR Inject 150 mg into the skin every 14 (fourteen) days.   oseltamivir 75 MG capsule Commonly known as: Tamiflu Take 1 capsule (75 mg total) by mouth 2 (two) times daily.   traMADol 50 MG tablet Commonly known as: ULTRAM Take 50-100 mg by mouth every 6 (six) hours as needed for pain.   Vitamin D3 75 MCG (3000 UT) Tabs Take 1 tablet  by mouth once daily. What changed:  how much to take how to take this when to take this           Objective:   Physical Exam BP (!) 156/82   Pulse 94   Temp 98.6 F (37 C)   Resp 20   Ht 5\' 3"  (1.6 m)   Wt 186 lb (84.4 kg)   SpO2 98%   BMI 32.95 kg/m  General:   Well developed, NAD, BMI noted. HEENT:  Normocephalic . Face symmetric, atraumatic. TMs normal. Throat: Symmetric, no red Nose: Mildly congested Sinuses: TTP mostly on the left side. Lungs:  CTA B Normal respiratory effort, no intercostal retractions, no accessory muscle use. Heart: RRR,  no murmur.  Lower extremities: no pretibial edema bilaterally  Skin: Not pale. Not jaundice Neurologic:  alert & oriented X3.  Speech normal, gait appropriate for age and unassisted Psych--  Cognition and judgment appear intact.  Cooperative with normal attention span and concentration.  Behavior appropriate. No anxious or depressed appearing.      Assessment     70 year old female, PMH includes hypertension, asthma, previous COVID vaccines, no flu shot this year.  Presents with:  Sinusitis The patient was recently diagnosed with viral syndrome, took Tamiflu, overall feels better except for persisting and severe cough, sinus pain, congestion and thick nasal discharge. Suspect sinusitis. Plan:  Amoxicillin Flonase, add Astepro. Cough control with Mucinex DM, Call if no gradually better, see AVS. Asthma: Fortunately not exacerbated.    This visit occurred during the SARS-CoV-2 public health emergency.  Safety protocols were in place, including screening questions prior to the visit, additional usage of staff PPE, and extensive cleaning of exam room while observing appropriate contact time as indicated for disinfecting solutions.

## 2020-12-30 ENCOUNTER — Other Ambulatory Visit: Payer: Self-pay

## 2020-12-30 ENCOUNTER — Ambulatory Visit
Admission: RE | Admit: 2020-12-30 | Discharge: 2020-12-30 | Disposition: A | Discharge status: Home / Self Care | Payer: Medicare PPO | Source: Ambulatory Visit | Attending: Family | Admitting: Family

## 2020-12-30 DIAGNOSIS — Z1231 Encounter for screening mammogram for malignant neoplasm of breast: Secondary | ICD-10-CM | POA: Diagnosis not present

## 2021-01-01 ENCOUNTER — Other Ambulatory Visit: Payer: Self-pay | Admitting: Family

## 2021-01-02 DIAGNOSIS — M1712 Unilateral primary osteoarthritis, left knee: Secondary | ICD-10-CM | POA: Diagnosis not present

## 2021-01-12 DIAGNOSIS — J4540 Moderate persistent asthma, uncomplicated: Secondary | ICD-10-CM | POA: Diagnosis not present

## 2021-01-13 ENCOUNTER — Encounter: Payer: Self-pay | Admitting: Family

## 2021-01-23 ENCOUNTER — Other Ambulatory Visit: Payer: Self-pay

## 2021-01-24 ENCOUNTER — Encounter: Payer: Self-pay | Admitting: Family

## 2021-01-24 ENCOUNTER — Ambulatory Visit (INDEPENDENT_AMBULATORY_CARE_PROVIDER_SITE_OTHER): Payer: Medicare PPO | Admitting: Family

## 2021-01-24 VITALS — BP 143/67 | HR 74 | Temp 98.4°F | Resp 16 | Wt 186.8 lb

## 2021-01-24 DIAGNOSIS — R739 Hyperglycemia, unspecified: Secondary | ICD-10-CM | POA: Diagnosis not present

## 2021-01-24 DIAGNOSIS — E785 Hyperlipidemia, unspecified: Secondary | ICD-10-CM | POA: Diagnosis not present

## 2021-01-24 DIAGNOSIS — R7989 Other specified abnormal findings of blood chemistry: Secondary | ICD-10-CM | POA: Diagnosis not present

## 2021-01-24 DIAGNOSIS — Z23 Encounter for immunization: Secondary | ICD-10-CM

## 2021-01-24 DIAGNOSIS — Z0000 Encounter for general adult medical examination without abnormal findings: Secondary | ICD-10-CM | POA: Diagnosis not present

## 2021-01-24 LAB — LIPID PANEL
Cholesterol: 192 mg/dL (ref 0–200)
HDL: 48.1 mg/dL (ref >39.00)
LDL Cholesterol: 113 mg/dL — ABNORMAL HIGH (ref 0–99)
NonHDL: 143.68
Total CHOL/HDL Ratio: 4
Triglycerides: 151 mg/dL — ABNORMAL HIGH (ref 0.0–149.0)
VLDL: 30.2 mg/dL (ref 0.0–40.0)

## 2021-01-24 LAB — COMPREHENSIVE METABOLIC PANEL
ALT: 48 U/L — ABNORMAL HIGH (ref 0–35)
AST: 43 U/L — ABNORMAL HIGH (ref 0–37)
Albumin: 4.2 g/dL (ref 3.5–5.2)
Alkaline Phosphatase: 64 U/L (ref 39–117)
BUN: 11 mg/dL (ref 6–23)
CO2: 24 mEq/L (ref 19–32)
Calcium: 9.6 mg/dL (ref 8.4–10.5)
Chloride: 105 mEq/L (ref 96–112)
Creatinine, Ser: 0.72 mg/dL (ref 0.40–1.20)
GFR: 84.83 mL/min (ref >60.00)
Glucose, Bld: 110 mg/dL — ABNORMAL HIGH (ref 70–99)
Potassium: 3.8 mEq/L (ref 3.5–5.1)
Sodium: 138 mEq/L (ref 135–145)
Total Bilirubin: 0.7 mg/dL (ref 0.2–1.2)
Total Protein: 6.7 g/dL (ref 6.0–8.3)

## 2021-01-24 LAB — HEMOGLOBIN A1C: Hgb A1c MFr Bld: 6.3 % (ref 4.6–6.5)

## 2021-01-24 NOTE — Patient Instructions (Signed)
Please visit the lab before you leave. Continue participating in regular exercise and maintaining a healthy diet.

## 2021-01-24 NOTE — Assessment & Plan Note (Signed)
Discussed healthy diet, exercise, weight loss. High dose flu shot today as well as prevnar 23 booster. Mammo and colo up to date. Aged out of pap smears.

## 2021-01-24 NOTE — Progress Notes (Signed)
Subjective:   By signing my name below, I, Brandy Dunlap, attest that this documentation has been prepared under the direction and in the presence of Brandy Alar, NP, 01/24/2021    Patient ID: Brandy Dunlap, female    DOB: 09/13/1950, 70 y.o.   MRN: [id number]  Chief Complaint  Patient presents with   Annual Exam    Physical     HPI Patient is in today for a comprehensive physical exam.  She denies any fever, unexpected weight change, adenopathy, rash, hearing loss, ear pain, rhinorrhea, visual disturbances, eye pain, chest pain, leg swelling, nausea, vomitting, blood in stool, dysuria, frequency, myalgias, headaches, depression or anxiety.  Iron: She has been receiving iron infusions with another provider. They ran her blood work and some of her levels were elevated so she would like to have this rechecked today.  Lab Results  Component Value Date   CHOL 204 (H) 04/04/2017   HDL 62 04/04/2017   LDLCALC 116 (H) 04/04/2017   LDLDIRECT 149.9 09/11/2010   TRIG 130 04/04/2017   CHOLHDL 3 10/24/2015  Flu: She recently contracted the flu about 3 weeks ago. She mentions that was the sickest she had ever been. She has since recovered.  Digestive concerns: She has been experiencing diarrhea from time to time since beginning her iron infusions.  Immunizations: She has not received her flu vaccine this season but is interested in getting it in the office today. She is due for her tetanus vaccine. She is UTD on her pneumonia vaccine. She is interested in getting her updated Covid-19 bivalent booster at another time.  Diet: She notes that she maintains a healthy diet.  Exercise: She participates in regular exercise.  Colonoscopy: Last performed on 08/30/2020 and results were normal. Repeat after 10 years.  Mammogram: Last performed on 01/02/2021 and results were normal. Repeat after one year.  Alcohol: She does not consume alcohol. Drugs: She does not use drugs at this  time. Tobacco: She does not use tobacco products.  Sexual activity: She not currently sexually active. Dental: She has a dental appointment scheduled for the end of the year.  Vision: She has a vision appointment in the next couple of weeks.  SHx: She reports no new surgeries in the last year. FMHx: She reports no new changes to her family medical history.    Health Maintenance Due  Topic Date Due   Hepatitis C Screening  Never done   Zoster Vaccines- Shingrix (1 of 2) Never done   Pneumonia Vaccine 73+ Years old (3 - PPSV23 if available, else PCV20) 08/29/2018   COVID-19 Vaccine (4 - Booster for Pfizer series) 01/30/2020   INFLUENZA VACCINE  09/26/2020   TETANUS/TDAP  10/31/2020    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   Essential hypertension 10/08/2007   Qualifier: Diagnosis of  By: Arnoldo Morale MD, Balinda Quails  GERD (gastroesophageal reflux disease)    History of kidney stones    per 09-02-2018 pre-op eval , suspected kidney stone , to [facility] CT abd on 7-9- for further eval , passive   Hypertension    Infectious disease 05/23/2015   Low back pain    Osteoarthritis    Pneumonia    Pre-diabetes    Prediabetes 07/09/2016   Referred otalgia of left ear 03/26/2016   Sinus trouble     Past Surgical History:  Procedure Laterality Date   CHOLECYSTECTOMY     COLONOSCOPY     COLONOSCOPY W/ BIOPSIES  10/06/2003   LUMBAR FUSION  2008   LUMBAR FUSION      2020   PARTIAL HIP ARTHROPLASTY Right 2009    hip replacement   TONSILLECTOMY     TOTAL HIP ARTHROPLASTY Left 09/10/2018   Procedure: TOTAL HIP ARTHROPLASTY ANTERIOR APPROACH;  Surgeon: Gaynelle Arabian, MD;  Location: [facility];  Service: Orthopedics;  Laterality: Left;   TOTAL KNEE ARTHROPLASTY Right 11/16/2013   Procedure: RIGHT TOTAL KNEE ARTHROPLASTY;  Surgeon: Vickey Huger, MD;  Location: [place];  Service: Orthopedics;  Laterality: Right;   VIDEO BRONCHOSCOPY Bilateral 12/05/2012   Procedure: VIDEO BRONCHOSCOPY WITHOUT FLUORO;  Surgeon: Collene Gobble, MD;  [facility];  Service: Cardiopulmonary;  Laterality: Bilateral;    Family History  Problem Relation Age of Onset   COPD Mother    Heart disease Mother    Ovarian cancer Mother    Emphysema Mother    Hypertension Mother    Hyperlipidemia Mother    Heart Problems Mother    Stroke Mother    Thyroid disease Mother    Cancer Mother    COPD Father        died at age 27   Emphysema Father    Heart Problems Father    Heart disease Brother        stent with 90%   Breast cancer Maternal Aunt    Colon cancer Neg Hx    Esophageal cancer Neg Hx    Rectal cancer Neg Hx    Stomach cancer Neg Hx    Colon polyps Neg Hx     Social History   Socioeconomic History   Marital status: Single    Spouse name: Not on file   Number of children: 1   Years of education: Not on file   Highest education level: Not on file  Occupational History   Occupation: Product manager: [employer]  Tobacco Use   Smoking status: Never   Smokeless tobacco: Never  Vaping Use   Vaping Use: Never used  Substance and Sexual Activity   Alcohol use: No   Drug use: No   Sexual activity: Not Currently    Partners: Male    Birth control/protection: Post-menopausal  Other Topics Concern   Not on file  Social History Narrative   Divorced   One daughter- lives locally and one grandson   Retired Pharmacist, hospital,  Has masters degree    Enjoys reading, spending time with her grandson   Social Determinants of Radio broadcast assistant Strain: Not on file  Food Insecurity: Not on file  Transportation Needs: Not on file  Physical Activity: Not on file  Stress: Not on file  Social Connections: Not on file  Intimate Partner Violence: Not on file    Outpatient Medications Prior to Visit  Medication Sig Dispense Refill   amLODipine (NORVASC) 5 MG tablet  TAKE 1 TABLET (5 MG TOTAL) BY MOUTH DAILY. 90 tablet 0   budesonide-formoterol (SYMBICORT) 160-4.5 MCG/ACT inhaler Inhale 2 puffs into the lungs 2 (two) times daily.     Cholecalciferol (VITAMIN D3) 75 MCG (3000 UT) TABS Take 1 tablet by mouth once daily. (Patient taking differently: Take 3,000 Units by mouth daily. Take 1 tablet by mouth once daily.) 90 tablet 1   dexlansoprazole (DEXILANT) 60 MG capsule TAKE 1 CAPSULE BY MOUTH EVERY DAY (Patient taking differently: Take 60 mg by mouth daily.) 90 capsule 1   diclofenac sodium (VOLTAREN) 1 % GEL Apply 2 g topically 4 (four) times daily as needed. (Patient taking differently: Apply 2 g topically 4 (four) times daily as needed (L knee pain).) 100 g 3   EPINEPHrine 0.3 mg/0.3 mL IJ SOAJ injection Inject 0.3 mg into the muscle as needed for anaphylaxis.     HYDROcodone bit-homatropine (HYCODAN) 5-1.5 MG/5ML syrup Take 5 mLs by mouth at bedtime as needed for cough. 90 mL 0   omalizumab (XOLAIR) 150 MG injection Inject 150 mg into the skin every 14 (fourteen) days.     traMADol (ULTRAM) 50 MG tablet Take 50-100 mg by mouth every 6 (six) hours as needed for pain.     amoxicillin (AMOXIL) 875 MG tablet Take 1 tablet (875 mg total) by mouth 2 (two) times daily. 20 tablet 0   predniSONE (DELTASONE) 10 MG tablet 3 tabs x 3 days, 2 tabs x 3 days, 1 tab x 3 days 18 tablet 0   No facility-administered medications prior to visit.    Allergies  Allergen Reactions   Pantoprazole Sodium Palpitations    Review of Systems  Constitutional:   Negative for fever.       (-) unexpected weight changes (-) adenopathy  HENT:  Negative for ear pain and hearing loss.        (-) rhinorrhea  Eyes:  Negative for pain.       (-) visual disturbances   Respiratory:  Positive for cough (chronic and persistent).   Cardiovascular:  Negative for chest pain and leg swelling.  Gastrointestinal:  Positive for diarrhea (from time to time). Negative for blood in stool, nausea and vomiting.  Genitourinary:  Negative for dysuria and frequency.  Musculoskeletal:  Positive for joint pain. Negative for myalgias.  Skin:  Negative for rash.  Neurological:  Negative for headaches.  Psychiatric/Behavioral:  Negative for depression. The patient is not nervous/anxious.       Objective:    Physical Exam Constitutional:      General: She is not in acute distress.    Appearance: Normal appearance. She is not ill-appearing.  HENT:     Head: Normocephalic and atraumatic.     Right Ear: Tympanic membrane, ear canal and external ear normal.     Left Ear: Tympanic membrane, ear canal and external ear normal.     Mouth/Throat:     Mouth: Mucous membranes are moist.     Pharynx: Oropharynx is clear.  Eyes:     Extraocular Movements: Extraocular movements intact.     Pupils: Pupils are equal, round, and reactive to light.     Comments: (-) nystagmus  Cardiovascular:     Rate and Rhythm: Normal rate and regular rhythm.     Heart sounds: Normal heart sounds. No murmur heard.   No gallop.  Pulmonary:     Effort: Pulmonary effort is normal. No respiratory distress.     Breath sounds: Wheezing (wheeze in base  of left lung) present. No rales.  Musculoskeletal:     Comments: (+) 5/5 upper and lower extremity strength  Lymphadenopathy:     Cervical: No cervical adenopathy.  Skin:    General: Skin is warm and dry.  Neurological:     Mental Status: She is alert and oriented to person, place, and time.     Deep Tendon Reflexes:     Reflex Scores:       Patellar reflexes are 2+ on the right side and 2+ on the left side. Psychiatric:        Behavior: Behavior normal.        Judgment: Judgment normal.    BP (!) 143/67 (BP Location: Right Arm, Patient Position: Sitting, Cuff Size: Small)   Pulse 74   Temp 98.4 F (36.9 C) (Oral)   Resp 16   Wt 186 lb 12.8 oz (84.7 kg)   SpO2 95%   BMI 33.09 kg/m  Wt Readings from Last 3 Encounters:  01/24/21 186 lb 12.8 oz (84.7 kg)  12/29/20 186 lb (84.4 kg)  12/20/20 190 lb (86.2 kg)       Assessment & Plan:   Problem List Items Addressed This Visit       Unprioritized   Preventative health care    Discussed healthy diet, exercise, weight loss. High dose flu shot today as well as prevnar 23 booster. Mammo and colo up to date. Aged out of pap smears.       Other Visit Diagnoses     Abnormal LFTs    -  Primary   Relevant Orders   Comp Met (CMET)   Hyperglycemia       Relevant Orders   Hemoglobin A1c   Hyperlipidemia, unspecified hyperlipidemia type       Relevant Orders   Lipid panel      No orders of the defined types were placed in this encounter.   I, Brandy Alar, NP, personally preformed the services described in this documentation.  All medical record entries made by the scribe were at my direction and in my presence.  I have reviewed the chart and discharge instructions (if applicable) and agree that the record reflects my personal performance and is accurate and complete. 01/24/2021  I,Brandy Dunlap,acting as a Education administrator for Nance Pear, NP.,have documented all relevant documentation on the behalf of Nance Pear, NP,as directed by  Nance Pear, NP while in the presence of Nance Pear, NP.  Nance Pear, NP

## 2021-01-26 DIAGNOSIS — J4540 Moderate persistent asthma, uncomplicated: Secondary | ICD-10-CM | POA: Diagnosis not present

## 2021-02-03 ENCOUNTER — Ambulatory Visit: Payer: Medicare PPO | Admitting: Family

## 2021-02-03 VITALS — BP 142/70 | HR 79 | Temp 98.6°F | Resp 16 | Ht 64.0 in | Wt 188.4 lb

## 2021-02-03 DIAGNOSIS — J209 Acute bronchitis, unspecified: Secondary | ICD-10-CM | POA: Diagnosis not present

## 2021-02-03 MED ORDER — ALBUTEROL SULFATE HFA 108 (90 BASE) MCG/ACT IN AERS: 2.0000 | INHALATION_SPRAY | Freq: Four times a day (QID) | RESPIRATORY_TRACT | 0 refills | Status: DC | PRN

## 2021-02-03 MED ORDER — AZITHROMYCIN 250 MG PO TABS: ORAL_TABLET | ORAL | 0 refills | Status: AC

## 2021-02-03 MED ORDER — CHERATUSSIN AC 100-10 MG/5ML PO SOLN
5.0000 mL | Freq: Three times a day (TID) | ORAL | 0 refills | Status: DC | PRN
Start: 2021-02-03 — End: 2021-03-10

## 2021-02-03 MED ORDER — PREDNISONE 10 MG PO TABS: ORAL_TABLET | ORAL | 0 refills | Status: DC

## 2021-02-03 MED ORDER — BENZONATATE 100 MG PO CAPS: 100.0000 mg | ORAL_CAPSULE | Freq: Three times a day (TID) | ORAL | 0 refills | Status: DC | PRN

## 2021-02-03 NOTE — Progress Notes (Signed)
Subjective:     Patient ID: Brandy Dunlap, female    DOB: 1950-03-19, 70 y.o.   MRN: [id number]  Chief Complaint  Patient presents with   Cough    Complains of Coughing and Chest Hurting     Cough  Patient is in today with chief complaint of cough.   She states that she was told that her cough is an asthma cough.  States that she is bringing up thick cloudy mucous.  Cough is so bad that her chest hurts. She denies fever.  She denies nasal drainage.   Health Maintenance Due  Topic Date Due   Hepatitis C Screening  Never done   Zoster Vaccines- Shingrix (1 of 2) Never done   COVID-19 Vaccine (4 - Booster for Pfizer series) 01/30/2020   TETANUS/TDAP  10/31/2020    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   Essential hypertension 10/08/2007   Qualifier: Diagnosis of  By: Arnoldo Morale MD, Balinda Quails    GERD (gastroesophageal reflux disease)    History of kidney stones    per 09-02-2018 pre-op eval , suspected kidney stone , to [facility] CT abd on 7-9- for further eval , passive   Hypertension    Infectious disease 05/23/2015   Low back pain    Osteoarthritis    Pneumonia    Pre-diabetes    Prediabetes 07/09/2016   Referred otalgia of left ear 03/26/2016   Sinus trouble     Past Surgical History:  Procedure Laterality Date   CHOLECYSTECTOMY     COLONOSCOPY     COLONOSCOPY W/ BIOPSIES  10/06/2003   LUMBAR FUSION  2008   LUMBAR FUSION     2020   PARTIAL  HIP ARTHROPLASTY Right 2009    hip replacement   TONSILLECTOMY     TOTAL HIP ARTHROPLASTY Left 09/10/2018   Procedure: TOTAL HIP ARTHROPLASTY ANTERIOR APPROACH;  Surgeon: Gaynelle Arabian, MD;  Location: [facility];  Service: Orthopedics;  Laterality: Left;   TOTAL KNEE ARTHROPLASTY Right 11/16/2013   Procedure: RIGHT TOTAL KNEE ARTHROPLASTY;  Surgeon: Vickey Huger, MD;  Location: [place];  Service: Orthopedics;  Laterality: Right;   VIDEO BRONCHOSCOPY Bilateral 12/05/2012   Procedure: VIDEO BRONCHOSCOPY WITHOUT FLUORO;  Surgeon: Collene Gobble, MD;  Location: [facility];  Service: Cardiopulmonary;  Laterality: Bilateral;    Family History  Problem Relation Age of Onset   COPD Mother    Heart disease Mother    Ovarian cancer Mother    Emphysema Mother    Hypertension Mother    Hyperlipidemia Mother    Heart Problems Mother    Stroke Mother    Thyroid disease Mother    Cancer Mother    COPD Father        died at age 68   Emphysema Father    Heart Problems Father    Heart disease  Brother        stent with 90%   Breast cancer Maternal Aunt    Colon cancer Neg Hx    Esophageal cancer Neg Hx    Rectal cancer Neg Hx    Stomach cancer Neg Hx    Colon polyps Neg Hx     Social History   Socioeconomic History   Marital status: Single    Spouse name: Not on file   Number of children: 1   Years of education: Not on file   Highest education level: Not on file  Occupational History   Occupation: Product manager: [employer]  Tobacco Use   Smoking status: Never   Smokeless tobacco: Never  Vaping Use   Vaping Use: Never used  Substance and Sexual Activity   Alcohol use: No   Drug use: No   Sexual activity: Not Currently    Partners: Male    Birth control/protection: Post-menopausal  Other Topics Concern   Not on file  Social History Narrative   Divorced   One daughter- lives locally and one grandson   Retired Pharmacist, hospital,  Has masters degree   Enjoys reading,  spending time with her grandson   Social Determinants of Radio broadcast assistant Strain: Not on file  Food Insecurity: Not on file  Transportation Needs: Not on file  Physical Activity: Not on file  Stress: Not on file  Social Connections: Not on file  Intimate Partner Violence: Not on file    Outpatient Medications Prior to Visit  Medication Sig Dispense Refill   amLODipine (NORVASC) 5 MG tablet TAKE 1 TABLET (5 MG TOTAL) BY MOUTH DAILY. 90 tablet 0   budesonide-formoterol (SYMBICORT) 160-4.5 MCG/ACT inhaler Inhale 2 puffs into the lungs 2 (two) times daily.     dexlansoprazole (DEXILANT) 60 MG capsule TAKE 1 CAPSULE BY MOUTH EVERY DAY (Patient taking differently: Take 60 mg by mouth daily.) 90 capsule 1   diclofenac sodium (VOLTAREN) 1 % GEL Apply 2 g topically 4 (four) times daily as needed. (Patient taking differently: Apply 2 g topically 4 (four) times daily as needed (L knee pain).) 100 g 3   EPINEPHrine 0.3 mg/0.3 mL IJ SOAJ injection Inject 0.3 mg into the muscle as needed for anaphylaxis.     omalizumab (XOLAIR) 150 MG injection Inject 150 mg into the skin every 14 (fourteen) days.     traMADol (ULTRAM) 50 MG tablet Take 50-100 mg by mouth every 6 (six) hours as needed for pain.     Cholecalciferol (VITAMIN D3) 75 MCG (3000 UT) TABS Take 1 tablet by mouth once daily. (Patient not taking: Reported on 02/03/2021) 90 tablet 1   HYDROcodone bit-homatropine (HYCODAN) 5-1.5 MG/5ML syrup Take 5 mLs by mouth at bedtime as needed for cough. (Patient not taking: Reported on 02/03/2021) 90 mL 0   No facility-administered medications prior to visit.    Allergies  Allergen Reactions   Pantoprazole Sodium Palpitations    Review of Systems  Respiratory:  Positive for cough.       Objective:    Physical Exam Constitutional:      Appearance: She is well-developed.  HENT:     Right Ear: Tympanic membrane and ear canal normal.     Left Ear: Tympanic membrane and ear canal normal.   Cardiovascular:     Rate and Rhythm: Normal rate and regular rhythm.     Heart sounds: Normal heart sounds. No murmur heard. Pulmonary:  Effort: Pulmonary effort is normal. No respiratory distress.     Breath sounds: Wheezing present.     Comments: Coarse cough noted Psychiatric:        Behavior: Behavior normal.        Thought Content: Thought content normal.        Judgment: Judgment normal.    BP (!) 142/70 (BP Location: Right Arm, Patient Position: Sitting, Cuff Size: Small)   Pulse 79   Temp 98.6 F (37 C) (Oral)   Resp 16   Ht 5\' 4"  (1.626 m)   Wt 188 lb 6.4 oz (85.5 kg)   SpO2 94%   BMI 32.34 kg/m  Wt Readings from Last 3 Encounters:  02/03/21 188 lb 6.4 oz (85.5 kg)  01/24/21 186 lb 12.8 oz (84.7 kg)  12/29/20 186 lb (84.4 kg)       Assessment & Plan:   Problem List Items Addressed This Visit       Unprioritized   Bronchitis with bronchospasm - Primary    New. Pt is advised as follows: Continue symbicort. Please begin azithromycin for bronchitis. Start prednisone for asthma. You may use albuterol as needed for wheezing. You may use cheratussin cough syrup as needed for cough- do not drive after taking this medication.  Call if symptoms worsen or if symptoms do not improve.        I have discontinued Lisbeth Renshaw. Asberry's HYDROcodone bit-homatropine and benzonatate. I am also having her start on albuterol, predniSONE, azithromycin, and Cheratussin AC. Additionally, I am having her maintain her omalizumab, budesonide-formoterol, Vitamin D3, diclofenac sodium, traMADol, EPINEPHrine, Dexilant, and amLODipine.  Meds ordered this encounter  Medications   albuterol (VENTOLIN HFA) 108 (90 Base) MCG/ACT inhaler    Sig: Inhale 2 puffs into the lungs every 6 (six) hours as needed for wheezing or shortness of breath.    Dispense:  8 g    Refill:  0    Order Specific Question:   Supervising Provider    Answer:   Penni Homans A [[id number]]   predniSONE (DELTASONE) 10  MG tablet    Sig: 4 tabs by mouth once daily for 2 days, then 3 tabs daily x 2 days, then 2 tabs daily x 2 days, then 1 tab daily x 2 days    Dispense:  20 tablet    Refill:  0    Order Specific Question:   Supervising Provider    Answer:   Penni Homans A [[id number]]   azithromycin (ZITHROMAX) 250 MG tablet    Sig: Take 2 tablets on day 1, then 1 tablet daily on days 2 through 5    Dispense:  6 tablet    Refill:  0    Order Specific Question:   Supervising Provider    Answer:   Penni Homans A [[id number]]   DISCONTD: benzonatate (TESSALON) 100 MG capsule    Sig: Take 1 capsule (100 mg total) by mouth 3 (three) times daily as needed.    Dispense:  20 capsule    Refill:  0    Order Specific Question:   Supervising Provider    Answer:   Penni Homans A [[id number]]   guaiFENesin-codeine (CHERATUSSIN AC) 100-10 MG/5ML syrup    Sig: Take 5 mLs by mouth 3 (three) times daily as needed for cough.    Dispense:  75 mL    Refill:  0    Order Specific Question:   Supervising Provider    Answer:   Penni Homans A [  [id number]]    

## 2021-02-03 NOTE — Patient Instructions (Signed)
Please begin azithromycin for bronchitis. Start prednisone for asthma. You may use albuterol as needed for wheezing. You may use cheratussin cough syrup as needed for cough- do not drive after taking this medication.

## 2021-02-03 NOTE — Assessment & Plan Note (Signed)
New. Pt is advised as follows: Continue symbicort. Please begin azithromycin for bronchitis. Start prednisone for asthma. You may use albuterol as needed for wheezing. You may use cheratussin cough syrup as needed for cough- do not drive after taking this medication.  Call if symptoms worsen or if symptoms do not improve.

## 2021-02-06 ENCOUNTER — Ambulatory Visit: Payer: Medicare PPO | Admitting: Family

## 2021-02-09 DIAGNOSIS — J4540 Moderate persistent asthma, uncomplicated: Secondary | ICD-10-CM | POA: Diagnosis not present

## 2021-02-23 ENCOUNTER — Other Ambulatory Visit: Payer: Self-pay | Admitting: Family

## 2021-02-23 DIAGNOSIS — J4540 Moderate persistent asthma, uncomplicated: Secondary | ICD-10-CM | POA: Diagnosis not present

## 2021-03-10 ENCOUNTER — Ambulatory Visit (HOSPITAL_BASED_OUTPATIENT_CLINIC_OR_DEPARTMENT_OTHER)
Admission: RE | Admit: 2021-03-10 | Discharge: 2021-03-10 | Disposition: A | Discharge status: Home / Self Care | Payer: Medicare PPO | Source: Ambulatory Visit | Attending: Family | Admitting: Family

## 2021-03-10 ENCOUNTER — Encounter: Payer: Self-pay | Admitting: Family

## 2021-03-10 ENCOUNTER — Ambulatory Visit: Payer: Medicare PPO | Admitting: Family

## 2021-03-10 ENCOUNTER — Other Ambulatory Visit: Payer: Self-pay

## 2021-03-10 VITALS — BP 135/68 | HR 70 | Temp 99.0°F | Resp 16 | Wt 189.0 lb

## 2021-03-10 DIAGNOSIS — R053 Chronic cough: Secondary | ICD-10-CM | POA: Diagnosis not present

## 2021-03-10 DIAGNOSIS — R350 Frequency of micturition: Secondary | ICD-10-CM | POA: Diagnosis not present

## 2021-03-10 DIAGNOSIS — J4540 Moderate persistent asthma, uncomplicated: Secondary | ICD-10-CM | POA: Diagnosis not present

## 2021-03-10 LAB — POC URINALSYSI DIPSTICK (AUTOMATED)
Bilirubin, UA: NEGATIVE
Glucose, UA: NEGATIVE
Ketones, UA: NEGATIVE
Leukocytes, UA: NEGATIVE
Nitrite, UA: NEGATIVE
Protein, UA: NEGATIVE
Spec Grav, UA: 1.02 (ref 1.010–1.025)
Urobilinogen, UA: 0.2 E.U./dL
pH, UA: 5.5 (ref 5.0–8.0)

## 2021-03-10 MED ORDER — PREDNISONE 10 MG PO TABS: ORAL_TABLET | ORAL | 0 refills | Status: DC

## 2021-03-10 MED ORDER — MONTELUKAST SODIUM 10 MG PO TABS: 10.0000 mg | ORAL_TABLET | Freq: Every day | ORAL | 3 refills | Status: DC

## 2021-03-10 NOTE — Assessment & Plan Note (Signed)
Trace blood on urine dip. Will send for culture. Plan to treat if + for UTI.

## 2021-03-10 NOTE — Assessment & Plan Note (Signed)
Suspect asthma related.  Continue current meds. Will also check cxr and repeat a longer prednisone taper. She will keep her upcoming allergist appointment. If cough persists despite these measures, will plan a referral to pulmonology.

## 2021-03-10 NOTE — Patient Instructions (Addendum)
Please start prednisone taper. Complete x-ray on the first floor.   Keep your upcoming appointment with Dr. Harold Hedge.  You can use delsym as needed for cough.

## 2021-03-10 NOTE — Progress Notes (Signed)
u

## 2021-03-10 NOTE — Progress Notes (Signed)
Subjective:     Patient ID: Brandy Dunlap, female    DOB: 10-08-1950, 71 y.o.   MRN: [id number]  Chief Complaint  Patient presents with   Cough    Complains of persistent cough   Urinary Frequency    Complains of urinary frequency with urgency    HPI  Had the flu in November, then sinusitis, then bronchitis.  Reports that her cough is "so much worse."  She reports that her cough improved with the prednisone until she got down to the final day of prednisone and then her cough comes back.  She takes dexilant every morning. She is also maintained on symbicort and singulair. She has an upcoming appointment with her allergist.   Health Maintenance Due  Topic Date Due   Hepatitis C Screening  Never done   Zoster Vaccines- Shingrix (1 of 2) Never done   COVID-19 Vaccine (4 - Booster for Pfizer series) 01/30/2020   TETANUS/TDAP  10/31/2020    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   Essential hypertension 10/08/2007   Qualifier: Diagnosis of  By: Arnoldo Morale MD, Balinda Quails    GERD (gastroesophageal reflux disease)    History of kidney stones    per 09-02-2018 pre-op eval , suspected kidney stone , to [facility] CT abd on 7-9- for further eval , passive   Hypertension    Infectious disease 05/23/2015   Low back pain    Osteoarthritis    Pneumonia    Pre-diabetes    Prediabetes 07/09/2016   Referred otalgia of left ear 03/26/2016   Sinus trouble     Past  Surgical History:  Procedure Laterality Date   CHOLECYSTECTOMY     COLONOSCOPY     COLONOSCOPY W/ BIOPSIES  10/06/2003   LUMBAR FUSION  2008   LUMBAR FUSION     2020   PARTIAL HIP ARTHROPLASTY Right 2009    hip replacement   TONSILLECTOMY     TOTAL HIP ARTHROPLASTY Left 09/10/2018   Procedure: TOTAL HIP ARTHROPLASTY ANTERIOR APPROACH;  Surgeon: Gaynelle Arabian, MD;  Location: [facility];  Service: Orthopedics;  Laterality: Left;   TOTAL KNEE ARTHROPLASTY Right 11/16/2013   Procedure: RIGHT TOTAL KNEE ARTHROPLASTY;  Surgeon: Vickey Huger, MD;  Location: [place];  Service: Orthopedics;  Laterality: Right;   VIDEO BRONCHOSCOPY Bilateral 12/05/2012   Procedure: VIDEO BRONCHOSCOPY WITHOUT FLUORO;  Surgeon: Collene Gobble, MD;  Location[facility];  Service: Cardiopulmonary;  Laterality: Bilateral;    Family History  Problem Relation Age of Onset   COPD Mother    Heart disease Mother    Ovarian cancer Mother    Emphysema Mother    Hypertension Mother    Hyperlipidemia Mother    Heart Problems Mother    Stroke Mother    Thyroid disease Mother    Cancer Mother    COPD Father  died at age 61   Emphysema Father    Heart Problems Father    Heart disease Brother        stent with 90%   Breast cancer Maternal Aunt    Colon cancer Neg Hx    Esophageal cancer Neg Hx    Rectal cancer Neg Hx    Stomach cancer Neg Hx    Colon polyps Neg Hx     Social History   Socioeconomic History   Marital status: Single    Spouse name: Not on file   Number of children: 1   Years of education: Not on file   Highest education level: Not on file  Occupational History   Occupation: Product manager: [employer]  Tobacco Use   Smoking status: Never   Smokeless tobacco: Never  Vaping Use   Vaping Use: Never used  Substance and Sexual Activity   Alcohol use: No   Drug use: No   Sexual activity: Not Currently    Partners: Male    Birth control/protection: Post-menopausal   Other Topics Concern   Not on file  Social History Narrative   Divorced   One daughter- lives locally and one grandson   Retired Pharmacist, hospital,  Has masters degree   Enjoys reading, spending time with her grandson   Social Determinants of Radio broadcast assistant Strain: Not on file  Food Insecurity: Not on file  Transportation Needs: Not on file  Physical Activity: Not on file  Stress: Not on file  Social Connections: Not on file  Intimate Partner Violence: Not on file    Outpatient Medications Prior to Visit  Medication Sig Dispense Refill   Albuterol Sulfate, sensor, (PROAIR DIGIHALER) 108 (90 Base) MCG/ACT AEPB Inhale into the lungs.     amLODipine (NORVASC) 5 MG tablet TAKE 1 TABLET (5 MG TOTAL) BY MOUTH DAILY. 90 tablet 0   budesonide-formoterol (SYMBICORT) 160-4.5 MCG/ACT inhaler Inhale 2 puffs into the lungs 2 (two) times daily.     DEXILANT 60 MG capsule TAKE 1 CAPSULE BY MOUTH EVERY DAY 90 capsule 1   diclofenac sodium (VOLTAREN) 1 % GEL Apply 2 g topically 4 (four) times daily as needed. (Patient taking differently: Apply 2 g topically 4 (four) times daily as needed (L knee pain).) 100 g 3   EPINEPHrine 0.3 mg/0.3 mL IJ SOAJ injection Inject 0.3 mg into the muscle as needed for anaphylaxis.     omalizumab (XOLAIR) 150 MG injection Inject 150 mg into the skin every 14 (fourteen) days.     traMADol (ULTRAM) 50 MG tablet Take 50-100 mg by mouth every 6 (six) hours as needed for pain.     albuterol (VENTOLIN HFA) 108 (90 Base) MCG/ACT inhaler Inhale 2 puffs into the lungs every 6 (six) hours as needed for wheezing or shortness of breath. 8 g 0   Cholecalciferol (VITAMIN D3) 75 MCG (3000 UT) TABS Take 1 tablet by mouth once daily. (Patient not taking: Reported on 02/03/2021) 90 tablet 1   guaiFENesin-codeine (CHERATUSSIN AC) 100-10 MG/5ML syrup Take 5 mLs by mouth 3 (three) times daily as needed for cough. 75 mL 0   predniSONE (DELTASONE) 10 MG tablet 4 tabs by mouth once daily  for 2 days, then 3 tabs daily x 2 days, then 2 tabs daily x 2 days, then 1 tab daily x 2 days 20 tablet 0   No facility-administered medications prior to visit.    Allergies  Allergen Reactions  Pantoprazole Sodium Palpitations    ROS     Objective:    Physical Exam Constitutional:      General: She is not in acute distress.    Appearance: Normal appearance. She is well-developed.  HENT:     Head: Normocephalic and atraumatic.     Right Ear: External ear normal.     Left Ear: External ear normal.  Eyes:     General: No scleral icterus. Neck:     Thyroid: No thyromegaly.  Cardiovascular:     Rate and Rhythm: Normal rate and regular rhythm.     Heart sounds: Normal heart sounds. No murmur heard. Pulmonary:     Effort: Pulmonary effort is normal. No respiratory distress.     Breath sounds: Wheezing (expiratory wheeze) present.  Musculoskeletal:     Cervical back: Neck supple.  Skin:    General: Skin is warm and dry.  Neurological:     Mental Status: She is alert and oriented to person, place, and time.  Psychiatric:        Mood and Affect: Mood normal.        Behavior: Behavior normal.        Thought Content: Thought content normal.        Judgment: Judgment normal.    BP 135/68 (BP Location: Right Arm, Patient Position: Sitting, Cuff Size: Small)    Pulse 70    Temp 99 F (37.2 C) (Oral)    Resp 16    Wt 189 lb (85.7 kg)    SpO2 95%    BMI 32.44 kg/m  Wt Readings from Last 3 Encounters:  03/10/21 189 lb (85.7 kg)  02/03/21 188 lb 6.4 oz (85.5 kg)  01/24/21 186 lb 12.8 oz (84.7 kg)       Assessment & Plan:   Problem List Items Addressed This Visit       Unprioritized   Urine frequency - Primary    Trace blood on urine dip. Will send for culture. Plan to treat if + for UTI.       Relevant Orders   POCT Urinalysis Dipstick (Automated) (Completed)   Urine Culture   Cough    Suspect asthma related.  Continue current meds. Will also check cxr and repeat  a longer prednisone taper. She will keep her upcoming allergist appointment. If cough persists despite these measures, will plan a referral to pulmonology.       Relevant Orders   DG Chest 2 View    I have discontinued Shene Maxfield. Archibald's Vitamin D3, albuterol, predniSONE, and Cheratussin AC. I am also having her start on montelukast and predniSONE. Additionally, I am having her maintain her omalizumab, budesonide-formoterol, diclofenac sodium, traMADol, EPINEPHrine, amLODipine, Dexilant, and ProAir Digihaler.  Meds ordered this encounter  Medications   montelukast (SINGULAIR) 10 MG tablet    Sig: Take 1 tablet (10 mg total) by mouth at bedtime.    Dispense:  30 tablet    Refill:  3    Order Specific Question:   Supervising Provider    Answer:   Penni Homans A [[id number]]   predniSONE (DELTASONE) 10 MG tablet    Sig: 4 tabs by mouth once daily for 3 days, then 3 tabs daily x 3 days, then 2 tabs daily x 3 days, then 1 tab daily x 3 days    Dispense:  30 tablet    Refill:  0    Order Specific Question:   Supervising Provider    Answer:  [place], Spelter [[id number]]

## 2021-03-12 LAB — URINE CULTURE
MICRO NUMBER:: 12868471
SPECIMEN QUALITY:: ADEQUATE

## 2021-03-13 ENCOUNTER — Telehealth: Payer: Self-pay | Admitting: Family

## 2021-03-13 MED ORDER — AMOXICILLIN-POT CLAVULANATE 875-125 MG PO TABS: 1.0000 | ORAL_TABLET | Freq: Two times a day (BID) | ORAL | 0 refills | Status: AC

## 2021-03-13 NOTE — Telephone Encounter (Signed)
Patient called back and advised of results and new rx

## 2021-03-13 NOTE — Telephone Encounter (Signed)
Urine culture grew bacteria.  Rx sent for augmentin.

## 2021-03-13 NOTE — Addendum Note (Signed)
Addended by: [name] on: 03/13/2021 12:13 PM   Modules accepted: Orders

## 2021-03-13 NOTE — Telephone Encounter (Signed)
Lvm  for patient to call back about results. 

## 2021-03-21 DIAGNOSIS — M1712 Unilateral primary osteoarthritis, left knee: Secondary | ICD-10-CM | POA: Diagnosis not present

## 2021-03-23 ENCOUNTER — Telehealth: Payer: Self-pay | Admitting: Family

## 2021-03-23 DIAGNOSIS — J4540 Moderate persistent asthma, uncomplicated: Secondary | ICD-10-CM | POA: Diagnosis not present

## 2021-03-23 NOTE — Telephone Encounter (Signed)
Pt dropped off document to be filled out (Sports Medicine and Joint Replacement document- 1 page Pre operative) Pt would like document to be faxed when ready to [phone]. Document put at front office tray under providers name.

## 2021-03-27 ENCOUNTER — Other Ambulatory Visit: Payer: Self-pay | Admitting: Family

## 2021-03-28 ENCOUNTER — Telehealth: Payer: Self-pay

## 2021-03-28 NOTE — Telephone Encounter (Signed)
Form received for medical clearance for TKR. Left voice mail for patient to call back, she will need office visit with Melissa to complete the medical clearance and get EKG, per PCP.

## 2021-03-28 NOTE — Telephone Encounter (Signed)
Patient advised she needs ov to complete the medical clearance (per PCP). Patient was scheduled for 04-03-2021

## 2021-04-03 ENCOUNTER — Ambulatory Visit: Payer: Medicare PPO | Admitting: Family

## 2021-04-03 VITALS — BP 135/66 | HR 82 | Temp 98.6°F | Resp 16 | Ht 63.0 in | Wt 191.6 lb

## 2021-04-03 DIAGNOSIS — I10 Essential (primary) hypertension: Secondary | ICD-10-CM | POA: Diagnosis not present

## 2021-04-03 DIAGNOSIS — J4540 Moderate persistent asthma, uncomplicated: Secondary | ICD-10-CM | POA: Diagnosis not present

## 2021-04-03 DIAGNOSIS — Z01818 Encounter for other preprocedural examination: Secondary | ICD-10-CM | POA: Diagnosis not present

## 2021-04-03 LAB — COMPREHENSIVE METABOLIC PANEL
ALT: 39 U/L — ABNORMAL HIGH (ref 0–35)
AST: 34 U/L (ref 0–37)
Albumin: 4.2 g/dL (ref 3.5–5.2)
Alkaline Phosphatase: 61 U/L (ref 39–117)
BUN: 10 mg/dL (ref 6–23)
CO2: 30 mEq/L (ref 19–32)
Calcium: 9.7 mg/dL (ref 8.4–10.5)
Chloride: 101 mEq/L (ref 96–112)
Creatinine, Ser: 0.72 mg/dL (ref 0.40–1.20)
GFR: 84.71 mL/min (ref >60.00)
Glucose, Bld: 104 mg/dL — ABNORMAL HIGH (ref 70–99)
Potassium: 4.1 mEq/L (ref 3.5–5.1)
Sodium: 138 mEq/L (ref 135–145)
Total Bilirubin: 0.6 mg/dL (ref 0.2–1.2)
Total Protein: 6.7 g/dL (ref 6.0–8.3)

## 2021-04-03 NOTE — Progress Notes (Signed)
Subjective:     Brandy Dunlap ID: Brandy Dunlap, female    DOB: 23-Feb-1951, 71 y.o.   MRN: [id number]  Chief Complaint  Brandy Dunlap presents with   Medical Clearance    Here for evaluation prior to TKR    HPI  Brandy Dunlap is in today for pre-operative evaluation for upcoming left Total Knee Replacement. Brandy Dunlap had a negative stress test 07/20/20.   Asthma- Reports that cough comes and goes.  Following with Allergist.  Believes that they will do a PFT.  Brandy Dunlap continues symbicort, prn albuterol and amlodipine.   HTN- maintained on amlodipine 29m once daily.   BP Readings from Last 3 Encounters:  04/03/21 135/66  03/10/21 135/68  02/03/21 (!) 142/70     Health Maintenance Due  Topic Date Due   Hepatitis C Screening  Never done   Zoster Vaccines- Shingrix (1 of 2) Never done   COVID-19 Vaccine (4 - Booster for Pfizer series) 01/30/2020   TETANUS/TDAP  10/31/2020    Past Medical History:  Diagnosis Date   Acute pain of left knee 10/02/2019   Allergic rhinitis 06/29/2009   Qualifier: Diagnosis of  By: TJoyce Gross    Allergic rhinitis due to animal (cat) (dog) hair and dander 07/07/2020   Allergy    Asthma    Atypical chest pain 04/18/2017   B12 deficiency    Back pain    Body mass index (BMI) 35.0-35.9, adult 05/01/2019   Bronchitis    hx   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   Chronic neck pain 03/01/2015   Colon polyp 10/06/2003   Depressive disorder 10/01/2016   PT DENIES   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   Essential hypertension 10/08/2007   Qualifier: Diagnosis of  By: JArnoldo MoraleMD, JBalinda Quails   GERD (gastroesophageal reflux disease)    History of kidney stones    per 09-02-2018 pre-op eval , suspected kidney stone , [facility] further eval , passive   Hypertension    Infectious disease 05/23/2015   Low back pain    Osteoarthritis    Pneumonia    Pre-diabetes    Prediabetes 07/09/2016   Referred otalgia of left ear 03/26/2016   Sinus trouble      Past Surgical History:  Procedure Laterality Date   CHOLECYSTECTOMY     COLONOSCOPY     COLONOSCOPY W/ BIOPSIES  10/06/2003   LUMBAR FUSION  2008   LUMBAR FUSION     2020   PARTIAL HIP ARTHROPLASTY Right 2009    hip replacement   TONSILLECTOMY     TOTAL HIP ARTHROPLASTY Left 09/10/2018   Procedure: TOTAL HIP ARTHROPLASTY ANTERIOR APPROACH;  Surgeon: AGaynelle Arabian MD;  Location: [facility];  Service: Orthopedics;  Laterality: Left;   TOTAL KNEE ARTHROPLASTY Right 11/16/2013   Procedure: RIGHT TOTAL KNEE ARTHROPLASTY;  Surgeon: SVickey Huger MD;  Location: [facility]  Service: Orthopedics;  Laterality: Right;   VIDEO BRONCHOSCOPY Bilateral 12/05/2012   Procedure: VIDEO BRONCHOSCOPY WITHOUT FLUORO;  Surgeon: RCollene Gobble MD;  Location: [facility];  Service: Cardiopulmonary;  Laterality: Bilateral;    Family History  Problem Relation Age of Onset   COPD Mother    Heart disease Mother    Ovarian cancer Mother    Emphysema Mother    Hypertension Mother    Hyperlipidemia Mother    Heart Problems Mother    Stroke Mother    Thyroid disease Mother    Cancer Mother    COPD  Father        died at age 28   Emphysema Father    Heart Problems Father    Heart disease Brother        stent with 90%   Breast cancer Maternal Aunt    Colon cancer Neg Hx    Esophageal cancer Neg Hx    Rectal cancer Neg Hx    Stomach cancer Neg Hx    Colon polyps Neg Hx     Social History   Socioeconomic History   Marital status: Single    Spouse name: Not on file   Number of children: 1   Years of education: Not on file   Highest education level: Not on file  Occupational History   Occupation: Product manager: [employer]  Tobacco Use   Smoking status: Never   Smokeless tobacco: Never  Vaping Use   Vaping Use: Never used  Substance and Sexual Activity   Alcohol use: No   Drug use: No   Sexual activity: Not Currently    Partners: Male    Birth control/protection:  Post-menopausal  Other Topics Concern   Not on file  Social History Narrative   Divorced   One daughter- lives locally and one grandson   Retired Pharmacist, hospital,  Has masters degree   Enjoys reading, spending time with Brandy Dunlap grandson   Social Determinants of Radio broadcast assistant Strain: Not on file  Food Insecurity: Not on file  Transportation Needs: Not on file  Physical Activity: Not on file  Stress: Not on file  Social Connections: Not on file  Intimate Partner Violence: Not on file    Outpatient Medications Prior to Visit  Medication Sig Dispense Refill   Albuterol Sulfate, sensor, (PROAIR DIGIHALER) 108 (90 Base) MCG/ACT AEPB Inhale into the lungs.     amLODipine (NORVASC) 5 MG tablet TAKE 1 TABLET (5 MG TOTAL) BY MOUTH DAILY. 90 tablet 1   budesonide-formoterol (SYMBICORT) 160-4.5 MCG/ACT inhaler Inhale 2 puffs into the lungs 2 (two) times daily.     DEXILANT 60 MG capsule TAKE 1 CAPSULE BY MOUTH EVERY DAY 90 capsule 1   diclofenac sodium (VOLTAREN) 1 % GEL Apply 2 g topically 4 (four) times daily as needed. (Brandy Dunlap taking differently: Apply 2 g topically 4 (four) times daily as needed (L knee pain).) 100 g 3   EPINEPHrine 0.3 mg/0.3 mL IJ SOAJ injection Inject 0.3 mg into the muscle as needed for anaphylaxis.     montelukast (SINGULAIR) 10 MG tablet Take 1 tablet (10 mg total) by mouth at bedtime. 30 tablet 3   omalizumab (XOLAIR) 150 MG injection Inject 150 mg into the skin every 14 (fourteen) days.     traMADol (ULTRAM) 50 MG tablet Take 50-100 mg by mouth every 6 (six) hours as needed for pain.     predniSONE (DELTASONE) 10 MG tablet 4 tabs by mouth once daily for 3 days, then 3 tabs daily x 3 days, then 2 tabs daily x 3 days, then 1 tab daily x 3 days 30 tablet 0   No facility-administered medications prior to visit.    Allergies  Allergen Reactions   Augmentin [Amoxicillin-Pot Clavulanate] Diarrhea   Pantoprazole Sodium Palpitations    Review of Systems   Constitutional:  Negative for fever.  HENT:  Positive for hearing loss (wears a right sided hearing aid). Negative for congestion.   Eyes:  Negative for blurred vision.  Respiratory:  Positive for cough (chronic  intermittent).   Cardiovascular:  Negative for chest pain.  Gastrointestinal:  Negative for constipation and diarrhea.  Genitourinary:  Negative for dysuria and frequency.  Musculoskeletal:  Positive for joint pain.  Skin:  Negative for rash.  Neurological:  Negative for headaches.  Psychiatric/Behavioral:         Denies depression/anxiety      Objective:    Physical Exam Constitutional:      General: Brandy Dunlap is not in acute distress.    Appearance: Normal appearance. Brandy Dunlap is well-developed.  HENT:     Head: Normocephalic and atraumatic.     Right Ear: External ear normal.     Left Ear: External ear normal.  Eyes:     General: No scleral icterus. Neck:     Thyroid: No thyromegaly.  Cardiovascular:     Rate and Rhythm: Normal rate and regular rhythm.     Heart sounds: Normal heart sounds. No murmur heard. Pulmonary:     Effort: Pulmonary effort is normal. No respiratory distress.     Breath sounds: Normal breath sounds. No wheezing.  Musculoskeletal:        General: No swelling.     Cervical back: Neck supple.  Skin:    General: Skin is warm and dry.  Neurological:     Mental Status: Brandy Dunlap is alert and oriented to person, place, and time.  Psychiatric:        Mood and Affect: Mood normal.        Behavior: Behavior normal.        Thought Content: Thought content normal.        Judgment: Judgment normal.    BP 135/66 (BP Location: Right Arm, Brandy Dunlap Position: Sitting, Cuff Size: Small)    Pulse 82    Temp 98.6 F (37 C) (Oral)    Resp 16    Ht '5\' 3"'  (1.6 m)    Wt 191 lb 9.6 oz (86.9 kg)    SpO2 97%    BMI 33.94 kg/m  Wt Readings from Last 3 Encounters:  04/03/21 191 lb 9.6 oz (86.9 kg)  03/10/21 189 lb (85.7 kg)  02/03/21 188 lb 6.4 oz (85.5 kg)        Assessment & Plan:   Problem List Items Addressed This Visit       Unprioritized   Preoperative examination - Primary    Labs/CXR as ordered.  EKG tracing is personally reviewed.  EKG notes NSR.  No acute changes. I also reviewed Brandy Dunlap EKG with Brandy Dunlap cardiologist- he feels that EKG is stable.         Relevant Orders   EKG 12-Lead (Completed)   Comp Met (CMET) (Completed)   CBC with Differential/Platelet (Completed)   Urine Culture (Completed)   Protime-INR (Completed)   Moderate persistent asthma, uncomplicated    Stable. Continue singulair, symbicort, albuterol prn.       Essential hypertension    BP Readings from Last 3 Encounters:  04/03/21 135/66  03/10/21 135/68  02/03/21 (!) 142/70  BP stable. Continue amlodipine 67monce daily.       Relevant Orders   Comp Met (CMET) (Completed)    Addendum:  reviewed labs/CXR/EKG.  Pt is acceptable medical risk to proceed with planned surger.    I have discontinued JLisbeth Renshaw Strange's predniSONE. I am also having Brandy Dunlap maintain Brandy Dunlap omalizumab, budesonide-formoterol, diclofenac sodium, traMADol, EPINEPHrine, Dexilant, ProAir Digihaler, montelukast, and amLODipine.  No orders of the defined types were placed in this encounter.

## 2021-04-03 NOTE — Patient Instructions (Signed)
Please complete lab work prior to leaving.   

## 2021-04-04 ENCOUNTER — Telehealth: Payer: Self-pay | Admitting: *Deleted

## 2021-04-04 LAB — CBC WITH DIFFERENTIAL/PLATELET
Basophils Absolute: 0.1 K/uL (ref 0.0–0.1)
Basophils Relative: 1.4 % (ref 0.0–3.0)
Eosinophils Absolute: 0.4 K/uL (ref 0.0–0.7)
Eosinophils Relative: 4.9 % (ref 0.0–5.0)
HCT: 42.8 % (ref 36.0–46.0)
Hemoglobin: 13.8 g/dL (ref 12.0–15.0)
Lymphocytes Relative: 34.7 % (ref 12.0–46.0)
Lymphs Abs: 2.7 K/uL (ref 0.7–4.0)
MCHC: 32.4 g/dL (ref 30.0–36.0)
MCV: 92.7 fl (ref 78.0–100.0)
Monocytes Absolute: 0.5 K/uL (ref 0.1–1.0)
Monocytes Relative: 6.3 % (ref 3.0–12.0)
Neutro Abs: 4.1 K/uL (ref 1.4–7.7)
Neutrophils Relative %: 52.7 % (ref 43.0–77.0)
Platelets: 274 K/uL (ref 150.0–400.0)
RBC: 4.62 Mil/uL (ref 3.87–5.11)
RDW: 14.7 % (ref 11.5–15.5)
WBC: 7.7 K/uL (ref 4.0–10.5)

## 2021-04-04 LAB — URINE CULTURE
MICRO NUMBER:: 12967810
SPECIMEN QUALITY:: ADEQUATE

## 2021-04-04 NOTE — Telephone Encounter (Signed)
PT / INR was not drawn at her visit yesterday.  I have spoken with pt and she will return on Thursday to complete the PT/INR.

## 2021-04-05 DIAGNOSIS — J4540 Moderate persistent asthma, uncomplicated: Secondary | ICD-10-CM | POA: Diagnosis not present

## 2021-04-05 DIAGNOSIS — Z01818 Encounter for other preprocedural examination: Secondary | ICD-10-CM | POA: Insufficient documentation

## 2021-04-05 NOTE — Assessment & Plan Note (Addendum)
Labs/CXR as ordered.  EKG tracing is personally reviewed.  EKG notes NSR.  No acute changes. I also reviewed her EKG with her cardiologist- he feels that EKG is stable.

## 2021-04-06 ENCOUNTER — Other Ambulatory Visit (INDEPENDENT_AMBULATORY_CARE_PROVIDER_SITE_OTHER): Payer: Medicare PPO

## 2021-04-06 DIAGNOSIS — Z01818 Encounter for other preprocedural examination: Secondary | ICD-10-CM | POA: Diagnosis not present

## 2021-04-06 LAB — PROTIME-INR
INR: 0.9 ratio (ref 0.8–1.0)
Prothrombin Time: 10.4 s (ref 9.6–13.1)

## 2021-04-07 ENCOUNTER — Telehealth: Payer: Self-pay | Admitting: Family

## 2021-04-07 DIAGNOSIS — J3089 Other allergic rhinitis: Secondary | ICD-10-CM | POA: Diagnosis not present

## 2021-04-07 DIAGNOSIS — J3081 Allergic rhinitis due to animal (cat) (dog) hair and dander: Secondary | ICD-10-CM | POA: Diagnosis not present

## 2021-04-07 DIAGNOSIS — J301 Allergic rhinitis due to pollen: Secondary | ICD-10-CM | POA: Diagnosis not present

## 2021-04-07 DIAGNOSIS — J4540 Moderate persistent asthma, uncomplicated: Secondary | ICD-10-CM | POA: Diagnosis not present

## 2021-04-07 NOTE — Telephone Encounter (Signed)
-----   Message from Park Liter, MD sent at 04/07/2021 10:52 AM EST ----- EKG looks good, she did have a stress test done in May of last year if she does not have any new symptoms should be fine to pursue surgical intervention ----- Message ----- From: [name], NP Sent: 04/05/2021  12:54 PM EST To: Park Liter, MD  Dr. Raliegh Ip,  Can you please review her most recent EKG on this mutual patient. She had a neg stress test last year. I am getting her cleared for TKA and just wanted to make sure that you are OK with this EKG.  I wasn't planning a formal cardiology evaluation unless you feel that it is necessary.  Thank you,  Lenna Sciara

## 2021-04-09 NOTE — Assessment & Plan Note (Signed)
Stable. Continue singulair, symbicort, albuterol prn.

## 2021-04-09 NOTE — Assessment & Plan Note (Signed)
BP Readings from Last 3 Encounters:  04/03/21 135/66  03/10/21 135/68  02/03/21 (!) 142/70   BP stable. Continue amlodipine 34m once daily.

## 2021-04-11 ENCOUNTER — Ambulatory Visit (INDEPENDENT_AMBULATORY_CARE_PROVIDER_SITE_OTHER): Payer: Medicare PPO

## 2021-04-11 ENCOUNTER — Other Ambulatory Visit: Payer: Self-pay

## 2021-04-11 DIAGNOSIS — Z0000 Encounter for general adult medical examination without abnormal findings: Secondary | ICD-10-CM | POA: Diagnosis not present

## 2021-04-11 NOTE — Patient Instructions (Signed)
Ms. Worthington , Thank you for taking time to come for your Medicare Wellness Visit. I appreciate your ongoing commitment to your health goals. Please review the following plan we discussed and let me know if I can assist you in the future.   Screening recommendations/referrals: Colonoscopy: No longer required  Mammogram: Done 12/30/20 repeat every year  Bone Density: Done 07/08/19 repeat every 2 years Recommended yearly ophthalmology/optometry visit for glaucoma screening and checkup Recommended yearly dental visit for hygiene and checkup  Vaccinations: Influenza vaccine: Done 04/07/21 repeat every year  Pneumococcal vaccine: Up to date Tdap vaccine: Due and discussed  Shingles vaccine: Shingrix discussed. Please contact your pharmacy for coverage information.    Covid-19:Completed 1/20, 2/16, & 12/17/19  Advanced directives: Advance directive discussed with you today. I have provided a copy for you to complete at home and have notarized. Once this is complete please bring a copy in to our office so we can scan it into your chart.   Conditions/risks identified: None at this time  Next appointment: Follow up in one year for your annual wellness visit    Preventive Care 65 Years and Older, Female Preventive care refers to lifestyle choices and visits with your health care provider that can promote health and wellness. What does preventive care include? A yearly physical exam. This is also called an annual well check. Dental exams once or twice a year. Routine eye exams. Ask your health care provider how often you should have your eyes checked. Personal lifestyle choices, including: Daily care of your teeth and gums. Regular physical activity. Eating a healthy diet. Avoiding tobacco and drug use. Limiting alcohol use. Practicing safe sex. Taking low-dose aspirin every day. Taking vitamin and mineral supplements as recommended by your health care provider. What happens during an  annual well check? The services and screenings done by your health care provider during your annual well check will depend on your age, overall health, lifestyle risk factors, and family history of disease. Counseling  Your health care provider may ask you questions about your: Alcohol use. Tobacco use. Drug use. Emotional well-being. Home and relationship well-being. Sexual activity. Eating habits. History of falls. Memory and ability to understand (cognition). Work and work Statistician. Reproductive health. Screening  You may have the following tests or measurements: Height, weight, and BMI. Blood pressure. Lipid and cholesterol levels. These may be checked every 5 years, or more frequently if you are over 23 years old. Skin check. Lung cancer screening. You may have this screening every year starting at age 41 if you have a 30-pack-year history of smoking and currently smoke or have quit within the past 15 years. Fecal occult blood test (FOBT) of the stool. You may have this test every year starting at age 63. Flexible sigmoidoscopy or colonoscopy. You may have a sigmoidoscopy every 5 years or a colonoscopy every 10 years starting at age 31. Hepatitis C blood test. Hepatitis B blood test. Sexually transmitted disease (STD) testing. Diabetes screening. This is done by checking your blood sugar (glucose) after you have not eaten for a while (fasting). You may have this done every 1-3 years. Bone density scan. This is done to screen for osteoporosis. You may have this done starting at age 69. Mammogram. This may be done every 1-2 years. Talk to your health care provider about how often you should have regular mammograms. Talk with your health care provider about your test results, treatment options, and if necessary, the need for more tests. Vaccines  Your health care provider may recommend certain vaccines, such as: Influenza vaccine. This is recommended every year. Tetanus,  diphtheria, and acellular pertussis (Tdap, Td) vaccine. You may need a Td booster every 10 years. Zoster vaccine. You may need this after age 66. Pneumococcal 13-valent conjugate (PCV13) vaccine. One dose is recommended after age 35. Pneumococcal polysaccharide (PPSV23) vaccine. One dose is recommended after age 56. Talk to your health care provider about which screenings and vaccines you need and how often you need them. This information is not intended to replace advice given to you by your health care provider. Make sure you discuss any questions you have with your health care provider. Document Released: 03/11/2015 Document Revised: 11/02/2015 Document Reviewed: 12/14/2014 Elsevier Interactive Patient Education  2017 [employer] Prevention in the Home Falls can cause injuries. They can happen to people of all ages. There are many things you can do to make your home safe and to help prevent falls. What can I do on the outside of my home? Regularly fix the edges of walkways and driveways and fix any cracks. Remove anything that might make you trip as you walk through a door, such as a raised step or threshold. Trim any bushes or trees on the path to your home. Use bright outdoor lighting. Clear any walking paths of anything that might make someone trip, such as rocks or tools. Regularly check to see if handrails are loose or broken. Make sure that both sides of any steps have handrails. Any raised decks and porches should have guardrails on the edges. Have any leaves, snow, or ice cleared regularly. Use sand or salt on walking paths during winter. Clean up any spills in your garage right away. This includes oil or grease spills. What can I do in the bathroom? Use night lights. Install grab bars by the toilet and in the tub and shower. Do not use towel bars as grab bars. Use non-skid mats or decals in the tub or shower. If you need to sit down in the shower, use a plastic,  non-slip stool. Keep the floor dry. Clean up any water that spills on the floor as soon as it happens. Remove soap buildup in the tub or shower regularly. Attach bath mats securely with double-sided non-slip rug tape. Do not have throw rugs and other things on the floor that can make you trip. What can I do in the bedroom? Use night lights. Make sure that you have a light by your bed that is easy to reach. Do not use any sheets or blankets that are too big for your bed. They should not hang down onto the floor. Have a firm chair that has side arms. You can use this for support while you get dressed. Do not have throw rugs and other things on the floor that can make you trip. What can I do in the kitchen? Clean up any spills right away. Avoid walking on wet floors. Keep items that you use a lot in easy-to-reach places. If you need to reach something above you, use a strong step stool that has a grab bar. Keep electrical cords out of the way. Do not use floor polish or wax that makes floors slippery. If you must use wax, use non-skid floor wax. Do not have throw rugs and other things on the floor that can make you trip. What can I do with my stairs? Do not leave any items on the stairs. Make sure that there are  handrails on both sides of the stairs and use them. Fix handrails that are broken or loose. Make sure that handrails are as long as the stairways. Check any carpeting to make sure that it is firmly attached to the stairs. Fix any carpet that is loose or worn. Avoid having throw rugs at the top or bottom of the stairs. If you do have throw rugs, attach them to the floor with carpet tape. Make sure that you have a light switch at the top of the stairs and the bottom of the stairs. If you do not have them, ask someone to add them for you. What else can I do to help prevent falls? Wear shoes that: Do not have high heels. Have rubber bottoms. Are comfortable and fit you well. Are closed  at the toe. Do not wear sandals. If you use a stepladder: Make sure that it is fully opened. Do not climb a closed stepladder. Make sure that both sides of the stepladder are locked into place. Ask someone to hold it for you, if possible. Clearly mark and make sure that you can see: Any grab bars or handrails. First and last steps. Where the edge of each step is. Use tools that help you move around (mobility aids) if they are needed. These include: Canes. Walkers. Scooters. Crutches. Turn on the lights when you go into a dark area. Replace any light bulbs as soon as they burn out. Set up your furniture so you have a clear path. Avoid moving your furniture around. If any of your floors are uneven, fix them. If there are any pets around you, be aware of where they are. Review your medicines with your doctor. Some medicines can make you feel dizzy. This can increase your chance of falling. Ask your doctor what other things that you can do to help prevent falls. This information is not intended to replace advice given to you by your health care provider. Make sure you discuss any questions you have with your health care provider. Document Released: 12/09/2008 Document Revised: 07/21/2015 Document Reviewed: 03/19/2014 Elsevier Interactive Patient Education  2017 [employer].

## 2021-04-11 NOTE — Progress Notes (Signed)
Virtual Visit via Telephone Note  I connected with  Breslyn Abdo Ellinwood on 04/11/21 at  1:00 PM EST by telephone and verified that I am speaking with the correct person using two identifiers.  Medicare Annual Wellness visit completed telephonically due to Covid-19 pandemic.   Persons participating in this call: This Health Coach and this patient.   Location: Patient: home Provider: office   I discussed the limitations, risks, security and privacy concerns of performing an evaluation and management service by telephone and the availability of in person appointments. The patient expressed understanding and agreed to proceed.  Unable to perform video visit due to video visit attempted and failed and/or patient does not have video capability.   Some vital signs may be absent or patient reported.   Willette Brace, LPN   Subjective:   Gracynn Rajewski is a 71 y.o. female who presents for Medicare Annual (Subsequent) preventive examination.  Review of Systems     Cardiac Risk Factors include: advanced age (>30men, >71 women);dyslipidemia;hypertension;obesity (BMI >30kg/m2)     Objective:    There were no vitals filed for this visit. There is no height or weight on file to calculate BMI.  Advanced Directives 04/11/2021 09/17/2019 09/14/2019 04/23/2019 09/10/2018 09/04/2018 03/05/2016  Does Patient Have a Medical Advance Directive? Yes No No No No No No  Does patient want to make changes to medical advance directive? Yes (MAU/Ambulatory/Procedural Areas - Information given) - - - - - -  Would patient like information on creating a medical advance directive? - No - Patient declined - No - Patient declined No - Patient declined No - Patient declined -  Pre-existing out of facility DNR order (yellow form or pink MOST form) - - - - - - -    Current Medications (verified) Outpatient Encounter Medications as of 04/11/2021  Medication Sig   Albuterol Sulfate, sensor, (PROAIR DIGIHALER) 108 (90  Base) MCG/ACT AEPB Inhale into the lungs.   amLODipine (NORVASC) 5 MG tablet TAKE 1 TABLET (5 MG TOTAL) BY MOUTH DAILY.   budesonide-formoterol (SYMBICORT) 160-4.5 MCG/ACT inhaler Inhale 2 puffs into the lungs 2 (two) times daily.   DEXILANT 60 MG capsule TAKE 1 CAPSULE BY MOUTH EVERY DAY   diclofenac sodium (VOLTAREN) 1 % GEL Apply 2 g topically 4 (four) times daily as needed. (Patient taking differently: Apply 2 g topically 4 (four) times daily as needed (L knee pain).)   EPINEPHrine 0.3 mg/0.3 mL IJ SOAJ injection Inject 0.3 mg into the muscle as needed for anaphylaxis.   montelukast (SINGULAIR) 10 MG tablet Take 1 tablet (10 mg total) by mouth at bedtime.   omalizumab (XOLAIR) 150 MG injection Inject 150 mg into the skin every 14 (fourteen) days.   traMADol (ULTRAM) 50 MG tablet Take 50-100 mg by mouth every 6 (six) hours as needed for pain.   No facility-administered encounter medications on file as of 04/11/2021.    Allergies (verified) Augmentin [amoxicillin-pot clavulanate] and Pantoprazole sodium   History: Past Medical History:  Diagnosis Date   Acute pain of left knee 10/02/2019   Allergic rhinitis 06/29/2009   Qualifier: Diagnosis of  By: Joyce Gross     Allergic rhinitis due to animal (cat) (dog) hair and dander 07/07/2020   Allergy    Asthma    Atypical chest pain 04/18/2017   B12 deficiency    Back pain    Body mass index (BMI) 35.0-35.9, adult 05/01/2019   Bronchitis    hx   Chronic allergic  conjunctivitis 07/07/2020   Chronic neck pain 03/01/2015   Colon polyp 10/06/2003   Depressive disorder 10/01/2016   PT DENIES   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   Essential hypertension 10/08/2007   Qualifier: Diagnosis of  By: Arnoldo Morale MD, Balinda Quails    GERD (gastroesophageal reflux disease)    History of kidney stones    per 09-02-2018 pre-op eval , suspected kidney stone , to [facility] abd on 7-9- for further eval , passive   Hypertension    Infectious disease 05/23/2015    Low back pain    Osteoarthritis    Pneumonia    Pre-diabetes    Prediabetes 07/09/2016   Referred otalgia of left ear 03/26/2016   Sinus trouble    Past Surgical History:  Procedure Laterality Date   CHOLECYSTECTOMY     COLONOSCOPY     COLONOSCOPY W/ BIOPSIES  10/06/2003   LUMBAR FUSION  2008   LUMBAR FUSION     2020   PARTIAL HIP ARTHROPLASTY Right 2009    hip replacement   TONSILLECTOMY     TOTAL HIP ARTHROPLASTY Left 09/10/2018   Procedure: TOTAL HIP ARTHROPLASTY ANTERIOR APPROACH;  Surgeon: Gaynelle Arabian, MD;  Location: [facility];  Service: Orthopedics;  Laterality: Left;   TOTAL KNEE ARTHROPLASTY Right 11/16/2013   Procedure: RIGHT TOTAL KNEE ARTHROPLASTY;  Surgeon: Vickey Huger, MD;  Location: [place];  Service: Orthopedics;  Laterality: Right;   VIDEO BRONCHOSCOPY Bilateral 12/05/2012   Procedure: VIDEO BRONCHOSCOPY WITHOUT FLUORO;  Surgeon: Collene Gobble, MD;  [facility];  Service: Cardiopulmonary;  Laterality: Bilateral;   Family History  Problem Relation Age of Onset   COPD Mother    Heart disease Mother    Ovarian cancer Mother    Emphysema Mother    Hypertension Mother    Hyperlipidemia Mother    Heart Problems Mother    Stroke Mother    Thyroid disease Mother    Cancer Mother    COPD Father        died at age 80   Emphysema Father    Heart Problems Father    Heart disease Brother        stent with 90%   Breast cancer Maternal Aunt    Colon cancer Neg Hx    Esophageal cancer Neg Hx    Rectal cancer Neg Hx    Stomach cancer Neg Hx    Colon polyps Neg Hx    Social History   Socioeconomic History   Marital status: Single    Spouse name: Not on file   Number of children: 1   Years of education: Not on file   Highest education level: Not on file  Occupational History   Occupation: Product manager: [employer]  Tobacco Use   Smoking status: Never   Smokeless tobacco: Never  Vaping Use   Vaping Use: Never used  Substance  and Sexual Activity   Alcohol use: No   Drug use: No   Sexual activity: Not Currently    Partners: Male    Birth control/protection: Post-menopausal  Other Topics Concern   Not on file  Social History Narrative   Divorced   One daughter- lives locally and one grandson   Retired Pharmacist, hospital,  Has masters degree   Enjoys reading, spending time with her grandson   Social Determinants of Health   Financial Resource Strain: Low Risk    Difficulty of Paying Living Expenses: Not hard at all  Food Insecurity: No Food Insecurity   Worried About Charity fundraiser in the Last Year: Never true   Ran Out of Food in the Last Year: Never true  Transportation Needs: No Transportation Needs   Lack of Transportation (Medical): No   Lack of Transportation (Non-Medical): No  Physical Activity: Insufficiently Active   Days of Exercise per Week: 3 days   Minutes of Exercise per Session: 40 min  Stress: No Stress Concern Present   Feeling of Stress : Not at all  Social Connections: Moderately Isolated   Frequency of Communication with Friends and Family: More than three times a week   Frequency of Social Gatherings with Friends and Family: More than three times a week   Attends Religious Services: More than 4 times per year   Active Member of [employer] or Organizations: No   Attends Music therapist: Never   Marital Status: Never married    Tobacco Counseling Counseling given: Not Answered   Clinical Intake:  Pre-visit preparation completed: Yes  Pain : No/denies pain     BMI - recorded: 32.44 Nutritional Status: BMI > 30  Obese Nutritional Risks: None Diabetes: No  How often do you need to have someone help you when you read instructions, pamphlets, or other written materials from your doctor or pharmacy?: 1 - Never  Diabetic?No  Interpreter Needed?: No  Information entered by :: Charlott Rakes, LPN   Activities of Daily Living In your present state of health, do you  have any difficulty performing the following activities: 04/11/2021 12/20/2020  Hearing? Y N  Comment right ear hearing aid -  Vision? N N  Difficulty concentrating or making decisions? N N  Walking or climbing stairs? N N  Dressing or bathing? N N  Doing errands, shopping? N N  Preparing Food and eating ? N -  Using the Toilet? N -  In the past six months, have you accidently leaked urine? N -  Do you have problems with loss of bowel control? N -  Managing your Medications? N -  Managing your Finances? N -  Housekeeping or managing your Housekeeping? N -  Some recent data might be hidden    Patient Care Team: [name], NP as PCP - General (Internal Medicine)  Indicate any recent Medical Services you may have received from other than [facility] providers in the past year (date may be approximate).     Assessment:   This is a routine wellness examination for L[place].  Hearing/Vision screen Hearing Screening - Comments:: Right ear hearing aid  Vision Screening - Comments:: [Pt follows up with Dr Sherlean Foot for annual eye exams   Dietary issues and exercise activities discussed: Current Exercise Habits: Structured exercise class, Type of exercise: Other - see comments, Time (Minutes): 45, Frequency (Times/Week): 3, Weekly Exercise (Minutes/Week): 135   Goals Addressed             This Visit's Progress    Patient Stated       None at this time        Depression Screen PHQ 2/9 Scores 04/11/2021 01/24/2021 12/20/2020 05/16/2020 04/23/2019 04/28/2018 04/17/2016  PHQ - 2 Score 0 0 0 0 0 0 1  PHQ- 9 Score - - 0 3 - 0 7    Fall Risk Fall Risk  04/11/2021 01/24/2021 12/20/2020 05/16/2020 04/23/2019  Falls in the past year? 0 0 0 0 0  Number falls in past yr: 0 0 0 0 0  Injury with  Fall? 0 0 0 0 0  Risk for fall due to : Impaired vision - - - -  Follow up Falls prevention discussed - Falls evaluation completed - Education provided;Falls prevention discussed    FALL RISK PREVENTION  PERTAINING TO THE HOME:  Any stairs in or around the home? Yes  If so, are there any without handrails? No  Home free of loose throw rugs in walkways, pet beds, electrical cords, etc? Yes  Adequate lighting in your home to reduce risk of falls? Yes   ASSISTIVE DEVICES UTILIZED TO PREVENT FALLS:  Life alert? No  Use of a cane, walker or w/c? No  Grab bars in the bathroom? Yes  Shower chair or bench in shower? Yes  Elevated toilet seat or a handicapped toilet? No   TIMED UP AND GO:  Was the test performed? No .   Cognitive Function:     6CIT Screen 04/11/2021  What Year? 0 points  What month? 0 points  What time? 0 points  Count back from 20 0 points  Months in reverse 0 points  Repeat phrase 2 points  Total Score 2    Immunizations Immunization History  Administered Date(s) Administered   Fluad Quad(high Dose 65+) 12/19/2018, 12/31/2019, 01/24/2021   Influenza Split 11/27/2011, 11/26/2012   Influenza, High Dose Seasonal PF 04/07/2021   PFIZER(Purple Top)SARS-COV-2 Vaccination 03/18/2019, 04/14/2019, 12/05/2019   PPD Test 11/16/2016   Pneumococcal Conjugate-13 08/28/2017   Pneumococcal Polysaccharide-23 10/25/2011, 01/24/2021   Td 09/06/2008   Tdap 11/01/2010   Zoster, Live 10/03/2011    TDAP status: Due, Education has been provided regarding the importance of this vaccine. Advised may receive this vaccine at local pharmacy or Health Dept. Aware to provide a copy of the vaccination record if obtained from local pharmacy or Health Dept. Verbalized acceptance and understanding.  Flu Vaccine status: Up to date  Pneumococcal vaccine status: Up to date  Covid-19 vaccine status: Completed vaccines  Qualifies for Shingles Vaccine? Yes   Zostavax completed Yes   Shingrix Completed?: No.    Education has been provided regarding the importance of this vaccine. Patient has been advised to call insurance company to determine out of pocket expense if they have not yet  received this vaccine. Advised may also receive vaccine at local pharmacy or Health Dept. Verbalized acceptance and understanding.  Screening Tests Health Maintenance  Topic Date Due   Hepatitis C Screening  Never done   Zoster Vaccines- Shingrix (1 of 2) Never done   COVID-19 Vaccine (4 - Booster for Pfizer series) 01/30/2020   TETANUS/TDAP  10/31/2020   MAMMOGRAM  12/30/2021   Pneumonia Vaccine 15+ Years old  Completed   INFLUENZA VACCINE  Completed   DEXA SCAN  Completed   HPV VACCINES  Aged Out   COLONOSCOPY (Pts 45-21yrs Insurance coverage will need to be confirmed)  Discontinued    Health Maintenance  Health Maintenance Due  Topic Date Due   Hepatitis C Screening  Never done   Zoster Vaccines- Shingrix (1 of 2) Never done   COVID-19 Vaccine (4 - Booster for Pfizer series) 01/30/2020   TETANUS/TDAP  10/31/2020    Colorectal cancer screening: No longer required.   Mammogram status: Completed 12/30/20. Repeat every year  Bone Density status: Completed 07/08/19. Results reflect: Bone density results: NORMAL. Repeat every 2 years.   Additional Screening:  Hepatitis C Screening: does qualify;  Vision Screening: Recommended annual ophthalmology exams for early detection of glaucoma and other disorders of the eye.  Is the patient up to date with their annual eye exam?  Yes  Who is the provider or what is the name of the office in which the patient attends annual eye exams? Dr Sherlean Foot If pt is not established with a provider, would they like to be referred to a provider to establish care? No .   Dental Screening: Recommended annual dental exams for proper oral hygiene  Community Resource Referral / Chronic Care Management: CRR required this visit?  No   CCM required this visit?  No      Plan:     I have personally reviewed and noted the following in the patients chart:   Medical and social history Use of alcohol, tobacco or illicit drugs  Current medications and  supplements including opioid prescriptions.  Functional ability and status Nutritional status Physical activity Advanced directives List of other physicians Hospitalizations, surgeries, and ER visits in previous 12 months Vitals Screenings to include cognitive, depression, and falls Referrals and appointments  In addition, I have reviewed and discussed with patient certain preventive protocols, quality metrics, and best practice recommendations. A written personalized care plan for preventive services as well as general preventive health recommendations were provided to patient.     Willette Brace, LPN   11/11/3844   Nurse Notes: None

## 2021-04-18 ENCOUNTER — Other Ambulatory Visit: Payer: Self-pay

## 2021-04-18 ENCOUNTER — Ambulatory Visit: Payer: Medicare PPO | Admitting: Cardiology

## 2021-04-18 ENCOUNTER — Encounter: Payer: Self-pay | Admitting: Cardiology

## 2021-04-18 VITALS — BP 144/76 | HR 82 | Ht 63.0 in | Wt 189.0 lb

## 2021-04-18 DIAGNOSIS — Z01818 Encounter for other preprocedural examination: Secondary | ICD-10-CM

## 2021-04-18 DIAGNOSIS — Z01810 Encounter for preprocedural cardiovascular examination: Secondary | ICD-10-CM | POA: Diagnosis not present

## 2021-04-18 DIAGNOSIS — I10 Essential (primary) hypertension: Secondary | ICD-10-CM | POA: Diagnosis not present

## 2021-04-18 DIAGNOSIS — E785 Hyperlipidemia, unspecified: Secondary | ICD-10-CM | POA: Diagnosis not present

## 2021-04-18 NOTE — Patient Instructions (Signed)

## 2021-04-18 NOTE — Progress Notes (Signed)
Cardiology Office Note:    Date:  04/18/2021   ID:  Amarah Brossman, DOB [birth year], MRN [id number]  PCP:  [name], NP  Cardiologist:  Jenne Campus, MD    Referring MD: [name], NP   No chief complaint on file. I need to have a knee surgery  History of Present Illness:    Cinda Hara is a 71 y.o. female with past medical history significant for asthma, iron deficiency anemia, essential hypertension, dyslipidemia, atypical chest pain.  She does have chronic arthritis of her knee.  She did have 2 injections with partial relief but now she stopped about potentially doing surgery.  Denies have any chest pain tightness squeezing pressure burning chest no palpitations dizziness swelling of lower extremities.  Overall clinically seems to be doing well.  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   Essential hypertension 10/08/2007   Qualifier: Diagnosis of  By: Arnoldo Morale MD, Balinda Quails    GERD (gastroesophageal reflux disease)    History of kidney stones    per 09-02-2018 pre-op eval , suspected kidney stone , to [facility] CT abd on 7-9- for further eval , passive   Hypertension    Infectious disease 05/23/2015   Low back pain    Osteoarthritis    Pneumonia    Pre-diabetes    Prediabetes 07/09/2016   Referred otalgia of left ear 03/26/2016   Sinus trouble     Past Surgical History:  Procedure Laterality Date   CHOLECYSTECTOMY      COLONOSCOPY     COLONOSCOPY W/ BIOPSIES  10/06/2003   LUMBAR FUSION  2008   LUMBAR FUSION     2020   PARTIAL HIP ARTHROPLASTY Right 2009    hip replacement   TONSILLECTOMY     TOTAL HIP ARTHROPLASTY Left 09/10/2018   Procedure: TOTAL HIP ARTHROPLASTY ANTERIOR APPROACH;  Surgeon: Gaynelle Arabian, MD;  [facility];  Service: Orthopedics;  Laterality: Left;   TOTAL KNEE ARTHROPLASTY Right 11/16/2013   Procedure: RIGHT TOTAL KNEE ARTHROPLASTY;  Surgeon: Vickey Huger, MD;  Location: [place];  Service: Orthopedics;  Laterality: Right;   VIDEO BRONCHOSCOPY Bilateral 12/05/2012   Procedure: VIDEO BRONCHOSCOPY WITHOUT FLUORO;  Surgeon: Collene Gobble, MD;  [facility];  Service: Cardiopulmonary;  Laterality: Bilateral;    Current Medications: Current Meds  Medication Sig   Albuterol Sulfate, sensor, (PROAIR DIGIHALER) 108 (90 Base) MCG/ACT AEPB Inhale into the lungs.   amLODipine (NORVASC) 5 MG tablet TAKE 1 TABLET (5 MG TOTAL) BY MOUTH DAILY.   budesonide-formoterol (SYMBICORT) 160-4.5 MCG/ACT inhaler Inhale 2 puffs into the lungs 2 (two) times daily.   DEXILANT 60 MG capsule TAKE 1 CAPSULE BY MOUTH EVERY DAY   diclofenac sodium (VOLTAREN) 1 % GEL Apply 2 g topically 4 (four) times daily as needed. (Patient taking differently: Apply 2 g topically 4 (four) times daily  as needed (L knee pain).)   EPINEPHrine 0.3 mg/0.3 mL IJ SOAJ injection Inject 0.3 mg into the muscle as needed for anaphylaxis.   montelukast (SINGULAIR) 10 MG tablet Take 1 tablet (10 mg total) by mouth at bedtime.   omalizumab (XOLAIR) 150 MG injection Inject 150 mg into the skin every 14 (fourteen) days.   traMADol (ULTRAM) 50 MG tablet Take 50-100 mg by mouth every 6 (six) hours as needed for pain.     Allergies:   Augmentin [amoxicillin-pot clavulanate] and Pantoprazole sodium   Social History   Socioeconomic History   Marital status: Single    Spouse name: Not on file   Number of children: 1   Years of  education: Not on file   Highest education level: Not on file  Occupational History   Occupation: Product manager: [employer]  Tobacco Use   Smoking status: Never    Passive exposure: Never   Smokeless tobacco: Never  Vaping Use   Vaping Use: Never used  Substance and Sexual Activity   Alcohol use: No   Drug use: No   Sexual activity: Not Currently    Partners: Male    Birth control/protection: Post-menopausal  Other Topics Concern   Not on file  Social History Narrative   Divorced   One daughter- lives locally and one grandson   Retired Pharmacist, hospital,  Has masters degree   Enjoys reading, spending time with her grandson   Social Determinants of Radio broadcast assistant Strain: Low Risk    Difficulty of Paying Living Expenses: Not hard at all  Food Insecurity: No Food Insecurity   Worried About Charity fundraiser in the Last Year: Never true   Arboriculturist in the Last Year: Never true  Transportation Needs: No Transportation Needs   Lack of Transportation (Medical): No   Lack of Transportation (Non-Medical): No  Physical Activity: Insufficiently Active   Days of Exercise per Week: 3 days   Minutes of Exercise per Session: 40 min  Stress: No Stress Concern Present   Feeling of Stress : Not at all  Social Connections: Moderately Isolated   Frequency of Communication with Friends and Family: More than three times a week   Frequency of Social Gatherings with Friends and Family: More than three times a week   Attends Religious Services: More than 4 times per year   Active Member of [employer] or Organizations: No   Attends Music therapist: Never   Marital Status: Never married     Family History: The patient's family history includes Breast cancer in her maternal aunt; COPD in her father and mother; Cancer in her mother; Emphysema in her father and mother; Heart Problems in her father and mother; Heart disease in her brother and mother;  Hyperlipidemia in her mother; Hypertension in her mother; Ovarian cancer in her mother; Stroke in her mother; Thyroid disease in her mother. There is no history of Colon cancer, Esophageal cancer, Rectal cancer, Stomach cancer, or Colon polyps. ROS:   Please see the history of present illness.    All 14 point review of systems negative except as described per history of present illness  EKGs/Labs/Other Studies Reviewed:      Recent Labs: 04/03/2021: ALT 39; BUN 10; Creatinine, Ser 0.72; Hemoglobin 13.8; Platelets 274.0; Potassium 4.1; Sodium 138  Recent Lipid Panel    Component Value Date/Time   CHOL 192 01/24/2021 0939   CHOL 204 (H) 04/04/2017  3762   TRIG 151.0 (H) 01/24/2021 0939   HDL 48.10 01/24/2021 0939   HDL 62 04/04/2017 0838   CHOLHDL 4 01/24/2021 0939   VLDL 30.2 01/24/2021 0939   LDLCALC 113 (H) 01/24/2021 0939   LDLCALC 116 (H) 04/04/2017 0838   LDLDIRECT 149.9 09/11/2010 0908    Physical Exam:    VS:  BP (!) 144/76 (BP Location: Left Arm)    Pulse 82    Ht 5\' 3"  (1.6 m)    Wt 189 lb (85.7 kg)    SpO2 96%    BMI 33.48 kg/m     Wt Readings from Last 3 Encounters:  04/18/21 189 lb (85.7 kg)  04/03/21 191 lb 9.6 oz (86.9 kg)  03/10/21 189 lb (85.7 kg)     GEN:  Well nourished, well developed in no acute distress HEENT: Normal NECK: No JVD; No carotid bruits LYMPHATICS: No lymphadenopathy CARDIAC: RRR, no murmurs, no rubs, no gallops RESPIRATORY:  Clear to auscultation without rales, wheezing or rhonchi  ABDOMEN: Soft, non-tender, non-distended MUSCULOSKELETAL:  No edema; No deformity  SKIN: Warm and dry LOWER EXTREMITIES: no swelling NEUROLOGIC:  Alert and oriented x 3 PSYCHIATRIC:  Normal affect   ASSESSMENT:    1. Dyslipidemia   2. Essential (primary) hypertension   3. Preoperative examination    PLAN:    In order of problems listed above:  Preop evaluation she did have evaluation of her heart had echocardiogram done 3 years ago showed preserved  left ventricle ejection fraction, she does not have signs and symptoms of congestive heart failure.  Stress test done in summer of last year showed no evidence of ischemia.  We blame her symptomatology of anemia and iron deficiency anemia after that was corrected she started doing dramatically better.  Chronic problem with her knee with arthritis she still very active goes to gym 3 times a week.  The form from cardiac standpoint review should be no problem for her pursuing surgical intervention for the knee. Essential hypertension blood pressure slightly elevated we discussed the issue of potentially increasing dose of amlodipine, she does not want to do it I propose to switch her amlodipine from evening time to morning. Dyslipidemia I did review her K PN which only had LDL of 113 HDL 48 this is from 01/24/2021 I propose starting medication however she is reluctant.  She said she prefers to do it with exercise and diet fine with this for about 6 months especially since that is something made her exercise surgery but she insist on trying that approach.  We will recheck her fasting lipid profile in 6 months from now   Medication Adjustments/Labs and Tests Ordered: Current medicines are reviewed at length with the patient today.  Concerns regarding medicines are outlined above.  No orders of the defined types were placed in this encounter.  Medication changes: No orders of the defined types were placed in this encounter.   Signed, Park Liter, MD, [facility] 04/18/2021 11:11 AM    [place]

## 2021-04-20 DIAGNOSIS — J4540 Moderate persistent asthma, uncomplicated: Secondary | ICD-10-CM | POA: Diagnosis not present

## 2021-05-04 ENCOUNTER — Ambulatory Visit: Payer: Medicare PPO | Admitting: Podiatry

## 2021-05-04 ENCOUNTER — Encounter: Payer: Self-pay | Admitting: Podiatry

## 2021-05-04 ENCOUNTER — Other Ambulatory Visit: Payer: Self-pay

## 2021-05-04 DIAGNOSIS — M778 Other enthesopathies, not elsewhere classified: Secondary | ICD-10-CM | POA: Diagnosis not present

## 2021-05-04 DIAGNOSIS — J4540 Moderate persistent asthma, uncomplicated: Secondary | ICD-10-CM | POA: Diagnosis not present

## 2021-05-04 MED ORDER — TRIAMCINOLONE ACETONIDE 40 MG/ML IJ SUSP
20.0000 mg | Freq: Once | INTRAMUSCULAR | Status: AC
  Administered 2021-05-04: 10:00:00 20 mg

## 2021-05-04 NOTE — Progress Notes (Signed)
She presents today for follow-up of her capsulitis she states that it seems to be doing better. ? ?Objective: Vital signs are stable alert oriented x3 there is no erythema edema salines drainage odor she has pain across the dorsum of the foot at the tarsometatarsal joints. ? ?Assessment: Dorsal tarsal capsulitis.  Osteoarthritis dorsal foot. ? ?Plan: Injected the area today with 10 mg Kenalog 5 mg Marcaine.  She tolerated procedure well without complications.  Follow-up with her as needed. ?

## 2021-05-08 ENCOUNTER — Ambulatory Visit: Payer: Medicare PPO | Admitting: Family

## 2021-05-08 VITALS — BP 135/65 | HR 65 | Temp 98.4°F | Resp 16 | Wt 188.0 lb

## 2021-05-08 DIAGNOSIS — H8110 Benign paroxysmal vertigo, unspecified ear: Secondary | ICD-10-CM | POA: Insufficient documentation

## 2021-05-08 DIAGNOSIS — H6502 Acute serous otitis media, left ear: Secondary | ICD-10-CM | POA: Diagnosis not present

## 2021-05-08 MED ORDER — MECLIZINE HCL 12.5 MG PO TABS: 12.5000 mg | ORAL_TABLET | Freq: Three times a day (TID) | ORAL | 0 refills | Status: DC | PRN

## 2021-05-08 MED ORDER — AZITHROMYCIN 250 MG PO TABS
ORAL_TABLET | ORAL | 0 refills | Status: AC
Start: 2021-05-08 — End: 2021-05-13

## 2021-05-08 NOTE — Assessment & Plan Note (Signed)
Rx with azithromycin, continue antihistamine, add flonase.  ?

## 2021-05-08 NOTE — Patient Instructions (Addendum)
Please start flonase 2 sprays each nostril once daily.  ?Start azithromycin to help with possible left sided ear infection. ?You may use meclizine as needed for dizziness.  ?

## 2021-05-08 NOTE — Assessment & Plan Note (Signed)
New.  Will give trial of low dose meclizine, refer for PT vestibular rehab.   ?

## 2021-05-08 NOTE — Progress Notes (Signed)
Subjective:   By signing my name below, I, Zite Okoli, attest that this documentation has been prepared under the direction and in the presence of [name], NP 05/08/2021        Patient ID: Brandy Dunlap, female    DOB: 03-05-1950, 70 y.o.   MRN: [id number]  Chief Complaint  Patient presents with   Dizziness    Complains of dizziness since Thursday last week, mostly during the night.    Headache    Complains of headache this morning   Ear Pain    Complains of pain behind the ears since Saturday    HPI Patient is in today for an office visit. She is accompanied by her daughter  Dizzy spells- She reports that on Thursday, she had an episode of dizziness in the morning. She did not feel good that night but thought it was due to the asthma shot. Friday night was worse and she was experiencing occasional nausea, dizziness. She took dramamine on Saturday which provided little relief. Today, she reports positional dizziness, headaches, ear pain localized to the back of her head, terrible sleeping and she feels like she is in a roller coaster. Her daughter reports she has been sleeping more than frequently and when she turns, she often has to hold on to something. She endorses sinus congestion and denies vision changes and stroke symptoms. Adds that the nausea often happens when she lays down and often feels better after she turns her head.  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   Essential hypertension 10/08/2007   Qualifier: Diagnosis of  By: Arnoldo Morale MD, Balinda Quails    GERD (gastroesophageal reflux disease)    History of kidney stones    per 09-02-2018 pre-op eval , suspected kidney stone , to [facility] CT abd on 7-9- for further eval , passive   Hypertension    Infectious disease 05/23/2015   Low back pain    Osteoarthritis    Pneumonia    Pre-diabetes    Prediabetes 07/09/2016   Referred otalgia of left ear 03/26/2016   Sinus trouble     Past Surgical History:  Procedure Laterality Date   CHOLECYSTECTOMY     COLONOSCOPY     COLONOSCOPY W/ BIOPSIES  10/06/2003   LUMBAR FUSION  2008   LUMBAR FUSION     2020   PARTIAL HIP ARTHROPLASTY Right 2009    hip replacement   TONSILLECTOMY     TOTAL HIP ARTHROPLASTY Left 09/10/2018   Procedure: TOTAL HIP ARTHROPLASTY ANTERIOR APPROACH;  Surgeon: Gaynelle Arabian, MD;  Location: [facility];  Service: Orthopedics;  Laterality: Left;   TOTAL KNEE ARTHROPLASTY Right 11/16/2013   Procedure: RIGHT TOTAL KNEE ARTHROPLASTY;  Surgeon: Vickey Huger, MD;  Location: [place];  Service: Orthopedics;  Laterality: Right;   VIDEO BRONCHOSCOPY Bilateral 12/05/2012   Procedure: VIDEO BRONCHOSCOPY WITHOUT  FLUORO;  Surgeon: Collene Gobble, MD;  Location: [facility];  Service: Cardiopulmonary;  Laterality: Bilateral;    Family History  Problem Relation Age of Onset   COPD Mother    Heart disease Mother    Ovarian cancer Mother    Emphysema Mother    Hypertension Mother    Hyperlipidemia Mother    Heart Problems Mother    Stroke Mother    Thyroid disease Mother    Cancer Mother    COPD Father        died at age 53   Emphysema Father    Heart Problems Father    Heart disease Brother        stent with 90%   Breast cancer Maternal Aunt    Colon cancer Neg Hx    Esophageal cancer Neg Hx    Rectal cancer Neg Hx    Stomach cancer Neg Hx    Colon polyps Neg Hx     Social History   Socioeconomic History    Marital status: Single    Spouse name: Not on file   Number of children: 1   Years of education: Not on file   Highest education level: Not on file  Occupational History   Occupation: Product manager: [employer]  Tobacco Use   Smoking status: Never    Passive exposure: Never   Smokeless tobacco: Never  Vaping Use   Vaping Use: Never used  Substance and Sexual Activity   Alcohol use: No   Drug use: No   Sexual activity: Not Currently    Partners: Male    Birth control/protection: Post-menopausal  Other Topics Concern   Not on file  Social History Narrative   Divorced   One daughter- lives locally and one grandson   Retired Pharmacist, hospital,  Has masters degree   Enjoys reading, spending time with her grandson   Social Determinants of Radio broadcast assistant Strain: Low Risk    Difficulty of Paying Living Expenses: Not hard at all  Food Insecurity: No Food Insecurity   Worried About Charity fundraiser in the Last Year: Never true   Arboriculturist in the Last Year: Never true  Transportation Needs: No Transportation Needs   Lack of Transportation (Medical): No   Lack of Transportation (Non-Medical): No  Physical Activity: Insufficiently Active   Days of Exercise per Week: 3 days   Minutes of Exercise per Session: 40 min  Stress: No Stress Concern Present   Feeling of Stress : Not at all  Social Connections: Moderately Isolated   Frequency of Communication with Friends and Family: More than three times a week   Frequency of Social Gatherings with Friends and Family: More than three times a week   Attends Religious Services: More than 4 times per year   Active Member of [employer] or Organizations: No   Attends Archivist Meetings: Never   Marital Status: Never married  Human resources officer Violence: Not At Risk   Fear of Current or Ex-Partner: No   Emotionally Abused: No   Physically Abused: No   Sexually Abused: No    Outpatient Medications  Prior to Visit  Medication Sig Dispense Refill   Albuterol Sulfate, sensor, (PROAIR DIGIHALER) 108 (90 Base) MCG/ACT AEPB Inhale into the lungs.     amLODipine (NORVASC) 5 MG tablet TAKE 1 TABLET (5 MG TOTAL) BY MOUTH DAILY. 90 tablet 1   budesonide-formoterol (SYMBICORT) 160-4.5 MCG/ACT inhaler  Inhale 2 puffs into the lungs 2 (two) times daily.     DEXILANT 60 MG capsule TAKE 1 CAPSULE BY MOUTH EVERY DAY 90 capsule 1   diclofenac sodium (VOLTAREN) 1 % GEL Apply 2 g topically 4 (four) times daily as needed. (Patient taking differently: Apply 2 g topically 4 (four) times daily as needed (L knee pain).) 100 g 3   EPINEPHrine 0.3 mg/0.3 mL IJ SOAJ injection Inject 0.3 mg into the muscle as needed for anaphylaxis.     montelukast (SINGULAIR) 10 MG tablet Take 1 tablet (10 mg total) by mouth at bedtime. 30 tablet 3   omalizumab (XOLAIR) 150 MG injection Inject 150 mg into the skin every 14 (fourteen) days.     traMADol (ULTRAM) 50 MG tablet Take 50-100 mg by mouth every 6 (six) hours as needed for pain.     No facility-administered medications prior to visit.    Allergies  Allergen Reactions   Augmentin [Amoxicillin-Pot Clavulanate] Diarrhea   Pantoprazole Sodium Palpitations    Review of Systems  Constitutional:  Negative for fever.  HENT:  Positive for congestion and ear pain. Negative for hearing loss.        (-)nystagmus (-)adenopathy  Eyes:  Negative for blurred vision and double vision.  Respiratory:  Negative for cough, shortness of breath and wheezing.   Cardiovascular:  Negative for chest pain and leg swelling.  Gastrointestinal:  Positive for nausea. Negative for blood in stool, diarrhea and vomiting.  Genitourinary:  Negative for dysuria and frequency.  Musculoskeletal:  Negative for joint pain and myalgias.  Skin:  Negative for rash.  Neurological:  Positive for dizziness and headaches.  Psychiatric/Behavioral:  Negative for depression. The patient is not nervous/anxious.        Objective:    Physical Exam Constitutional:      General: She is not in acute distress.    Appearance: Normal appearance. She is not ill-appearing.  HENT:     Head: Normocephalic and atraumatic.     Right Ear: External ear normal. Tympanic membrane is not erythematous.     Left Ear: External ear normal. Tympanic membrane is not erythematous.     Ears:     Comments: Dull tympanic membrane in left ear  Eyes:     Extraocular Movements: Extraocular movements intact.     Pupils: Pupils are equal, round, and reactive to light.  Cardiovascular:     Rate and Rhythm: Normal rate and regular rhythm.     Pulses: Normal pulses.     Heart sounds: Normal heart sounds. No murmur heard. Pulmonary:     Effort: Pulmonary effort is normal. No respiratory distress.     Breath sounds: Normal breath sounds. No wheezing or rhonchi.  Abdominal:     General: Bowel sounds are normal. There is no distension.     Palpations: Abdomen is soft.     Tenderness: There is no abdominal tenderness. There is no guarding or rebound.  Musculoskeletal:     Cervical back: Neck supple.  Lymphadenopathy:     Cervical: No cervical adenopathy.  Skin:    General: Skin is warm and dry.  Neurological:     Mental Status: She is alert and oriented to person, place, and time.     Cranial Nerves: Cranial nerves 2-12 are intact.     Comments: + dizziness with mild turning of head  Psychiatric:        Behavior: Behavior normal.        Judgment: Judgment  normal.    BP 135/65 (BP Location: Right Arm, Patient Position: Sitting, Cuff Size: Small)    Pulse 65    Temp 98.4 F (36.9 C) (Oral)    Resp 16    Wt 188 lb (85.3 kg)    SpO2 97%    BMI 33.30 kg/m  Wt Readings from Last 3 Encounters:  05/08/21 188 lb (85.3 kg)  04/18/21 189 lb (85.7 kg)  04/03/21 191 lb 9.6 oz (86.9 kg)    Diabetic Foot Exam - Simple   No data filed    Lab Results  Component Value Date   WBC 7.7 04/03/2021   HGB 13.8 04/03/2021   HCT  42.8 04/03/2021   PLT 274.0 04/03/2021   GLUCOSE 104 (H) 04/03/2021   CHOL 192 01/24/2021   TRIG 151.0 (H) 01/24/2021   HDL 48.10 01/24/2021   LDLDIRECT 149.9 09/11/2010   LDLCALC 113 (H) 01/24/2021   ALT 39 (H) 04/03/2021   AST 34 04/03/2021   NA 138 04/03/2021   K 4.1 04/03/2021   CL 101 04/03/2021   CREATININE 0.72 04/03/2021   BUN 10 04/03/2021   CO2 30 04/03/2021   TSH 1.24 04/04/2020   INR 0.9 04/06/2021   HGBA1C 6.3 01/24/2021    Lab Results  Component Value Date   TSH 1.24 04/04/2020   Lab Results  Component Value Date   WBC 7.7 04/03/2021   HGB 13.8 04/03/2021   HCT 42.8 04/03/2021   MCV 92.7 04/03/2021   PLT 274.0 04/03/2021   Lab Results  Component Value Date   NA 138 04/03/2021   K 4.1 04/03/2021   CO2 30 04/03/2021   GLUCOSE 104 (H) 04/03/2021   BUN 10 04/03/2021   CREATININE 0.72 04/03/2021   BILITOT 0.6 04/03/2021   ALKPHOS 61 04/03/2021   AST 34 04/03/2021   ALT 39 (H) 04/03/2021   PROT 6.7 04/03/2021   ALBUMIN 4.2 04/03/2021   CALCIUM 9.7 04/03/2021   ANIONGAP 7 12/08/2020   GFR 84.71 04/03/2021   Lab Results  Component Value Date   CHOL 192 01/24/2021   Lab Results  Component Value Date   HDL 48.10 01/24/2021   Lab Results  Component Value Date   LDLCALC 113 (H) 01/24/2021   Lab Results  Component Value Date   TRIG 151.0 (H) 01/24/2021   Lab Results  Component Value Date   CHOLHDL 4 01/24/2021   Lab Results  Component Value Date   HGBA1C 6.3 01/24/2021       Assessment & Plan:   Problem List Items Addressed This Visit       Unprioritized   Benign paroxysmal positional vertigo - Primary    New.  Will give trial of low dose meclizine, refer for PT vestibular rehab.        Relevant Orders   Ambulatory referral to Physical Therapy   Acute serous otitis media of left ear    Rx with azithromycin, continue antihistamine, add flonase.       Relevant Medications   azithromycin (ZITHROMAX) 250 MG tablet    Meds  ordered this encounter  Medications   meclizine (ANTIVERT) 12.5 MG tablet    Sig: Take 1 tablet (12.5 mg total) by mouth 3 (three) times daily as needed for dizziness.    Dispense:  30 tablet    Refill:  0    Order Specific Question:   Supervising Provider    Answer:   Penni Homans A [[id number]]   azithromycin (ZITHROMAX) 250 MG  tablet    Sig: Take 2 tablets on day 1, then 1 tablet daily on days 2 through 5    Dispense:  6 tablet    Refill:  0    Order Specific Question:   Supervising Provider    Answer:   Penni Homans A [[id number]]    I,Zite Okoli,acting as a scribe for Nance Pear, NP.,have documented all relevant documentation on the behalf of Nance Pear, NP,as directed by  Nance Pear, NP while in the presence of Nance Pear, NP.   I, [name], NP, personally preformed the services described in this documentation.  All medical record entries made by the scribe were at my direction and in my presence.  I have reviewed the chart and discharge instructions (if applicable) and agree that the record reflects my personal performance and is accurate and complete. 05/08/2021

## 2021-05-15 ENCOUNTER — Encounter: Payer: Self-pay | Admitting: Physical Therapy

## 2021-05-15 ENCOUNTER — Other Ambulatory Visit: Payer: Self-pay

## 2021-05-15 ENCOUNTER — Ambulatory Visit: Payer: Medicare PPO | Attending: Family | Admitting: Physical Therapy

## 2021-05-15 DIAGNOSIS — M542 Cervicalgia: Secondary | ICD-10-CM | POA: Insufficient documentation

## 2021-05-15 DIAGNOSIS — H8111 Benign paroxysmal vertigo, right ear: Secondary | ICD-10-CM | POA: Insufficient documentation

## 2021-05-15 DIAGNOSIS — H8110 Benign paroxysmal vertigo, unspecified ear: Secondary | ICD-10-CM | POA: Insufficient documentation

## 2021-05-15 DIAGNOSIS — R42 Dizziness and giddiness: Secondary | ICD-10-CM | POA: Insufficient documentation

## 2021-05-15 NOTE — Therapy (Signed)
[facility] ?[address] ?[place], [place], [address] ?Phone: [phone]   Fax:  [phone] ? ?Physical Therapy Evaluation ? ?Patient Details  ?Name: Brandy Dunlap ?MRN: [id number] ?Date of Birth: 03/13/1950 ?Referring Provider (PT): [name] NP ? ? ?Encounter Date: 05/15/2021 ? ? PT End of Session - 05/15/21 1532   ? ? Visit Number 1   ? Number of Visits 12   ? Date for PT Re-Evaluation 06/26/21   ? Authorization Type [employer]   ? Progress Note Due on Visit 10   ? PT Start Time 1535   ? PT Stop Time 1620   ? PT Time Calculation (min) 45 min   ? Activity Tolerance Patient tolerated treatment well   ? Behavior During Therapy [facility] for tasks assessed/performed   ? ?  ?  ? ?  ? ? ?Past Medical History:  ?Diagnosis Date  ? Acute pain of left knee 10/02/2019  ? Allergic rhinitis 06/29/2009  ? Qualifier: Diagnosis of  By: Joyce Gross    ? Allergic rhinitis due to animal (cat) (dog) hair and dander 07/07/2020  ? Allergy   ? Asthma   ? Atypical chest pain 04/18/2017  ? B12 deficiency   ? Back pain   ? Body mass index (BMI) 35.0-35.9, adult 05/01/2019  ? Bronchitis   ? hx  ? 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  ? Chronic neck pain 03/01/2015  ? Colon polyp 10/06/2003  ? Depressive disorder 10/01/2016  ? PT DENIES  ? 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 Essential hypertension 10/08/2007  ? Qualifier: Diagnosis of  By: Arnoldo Morale MD, Balinda Quails   ? GERD (gastroesophageal reflux disease)   ? History of kidney stones   ? per 09-02-2018 pre-op eval , suspected kidney stone , to [facility] CT abd on 7-9- for further eval , passive  ? Hypertension   ? Infectious disease 05/23/2015  ? Low back pain   ? Osteoarthritis   ? Pneumonia   ? Pre-diabetes   ? Prediabetes 07/09/2016  ? Referred otalgia of left ear 03/26/2016  ? Sinus trouble   ? ? ?Past Surgical History:  ?Procedure Laterality Date  ? CHOLECYSTECTOMY    ? COLONOSCOPY    ? COLONOSCOPY W/  BIOPSIES  10/06/2003  ? LUMBAR FUSION  2008  ? LUMBAR FUSION    ? 2020  ? PARTIAL HIP ARTHROPLASTY Right 2009  ?  hip replacement  ? TONSILLECTOMY    ? TOTAL HIP ARTHROPLASTY Left 09/10/2018  ? Procedure: TOTAL HIP ARTHROPLASTY ANTERIOR APPROACH;  Surgeon: Gaynelle Arabian, MD;  Location: [facility];  Service: Orthopedics;  Laterality: Left;  ? TOTAL KNEE ARTHROPLASTY Right 11/16/2013  ? Procedure: RIGHT TOTAL KNEE ARTHROPLASTY;  Surgeon: Vickey Huger, MD;  Location: [facility];  Service: Orthopedics;  Laterality: Right;  ? VIDEO BRONCHOSCOPY Bilateral 12/05/2012  ? Procedure: VIDEO BRONCHOSCOPY WITHOUT FLUORO;  Surgeon: Collene Gobble, MD;  Location: [facility];  Service: Cardiopulmonary;  Laterality: Bilateral;  ? ? ?There were no vitals filed for this visit. ? ? ? Subjective Assessment - 05/15/21 1545   ? ? Subjective Patient is here for vestibular evaluation.   She had severe dizziness start on 05/05/21, saw her primary care provider who put her on antibiotics for L ear infection.  She reports symptoms have improved but still has pain in L ear and throbbing, although has finished antibiotics.  She is worried that vertigo will interfere with planned knee surgery in  April.  She is walking today with cane due to L knee pain.   ? 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 Limitations Other (comment)   bed mobility  ? Patient Stated Goals decrease dizziness so can have surgery.   ? Currently in Pain? Yes   ? Pain Score 7    ? Pain Location Knee   ? Pain Orientation Left   ? Multiple Pain Sites Yes   ? Pain Score 2   ? Pain Location Ear   ? Pain Orientation Left   ? Pain Descriptors / Indicators Throbbing   ? Pain Type Acute pain   ? Pain Radiating Towards also has headache with ear pain   ? Pain Onset 1 to 4 weeks ago   ? ?  ?  ? ?  ? ? ? ? ? [facility] PT Assessment - 05/15/21 0001   ? ?  ? Assessment  ? Medical Diagnosis H81.10 (ICD-10-CM) - Benign paroxysmal positional  vertigo, unspecified laterality   ? Referring Provider (PT) [name] NP   ? Onset Date/Surgical Date 05/05/21   ?  ? Precautions  ? Precautions None   ?  ? Restrictions  ? Weight Bearing Restrictions No   ?  ? Balance Screen  ? Has the patient fallen in the past 6 months No   ? Has the patient had a decrease in activity level because of a fear of falling?  No   ? Is the patient reluctant to leave their home because of a fear of falling?  No   ?  ? Prior Function  ? Level of Independence Independent   ?  ? Cognition  ? Overall Cognitive Status Within Functional Limits for tasks assessed   ?  ? Observation/Other Assessments  ? Observations guarded head movements   ?  ? ROM / Strength  ? AROM / PROM / Strength AROM   ?  ? AROM  ? Overall AROM  Within functional limits for tasks performed   ? Overall AROM Comments tested in sitting   ? AROM Assessment Site Cervical   ? Cervical Flexion 30   ? Cervical Extension 30   ? Cervical - Right Rotation 70   ? Cervical - Left Rotation 70   ? ?  ?  ? ?  ? ? ? ? ? ? ? ? ? Vestibular Assessment - 05/15/21 0001   ? ?  ? Vestibular Assessment  ? General Observation patient enters with SPC, due to knee pain.  No apparent distress.   ?  ? Symptom Behavior  ? Subjective history of current problem sudden onset on 05/05/21, severe dizziness.  She was given antibiotic for L ear infection and meclizine.  symptoms have improved but still has difficulty at night rolling over in bed.   ? Type of Dizziness  Lightheadedness;Spinning   spinning at night in bed  ? Frequency of Dizziness daily, worse in afternoon and night   ? Duration of Dizziness 30 sec to 1 min   ? Symptom Nature Motion provoked;Positional   ? Aggravating Factors Rolling to right;Rolling to left;Forward bending   ? Relieving Factors Head stationary;Closing eyes   ? Progression of Symptoms Better   ? History of similar episodes none   ?  ? Oculomotor Exam  ? Oculomotor Alignment Normal   ? Ocular ROM no convergence   ?  Spontaneous --   ?  Gaze-induced  Right beating nystagmus with R gaze   ? Head shaking Horizontal Absent   ? Head Shaking Vertical Absent   ? Smooth Pursuits Saccades   ? Saccades Poor trajectory;Undershoots   ?  ? Positional Testing  ? Dix-Hallpike Dix-Hallpike Right;Dix-Hallpike Left   ? Horizontal Canal Testing Horizontal Canal Right;Horizontal Canal Left   ?  ? Dix-Hallpike Right  ? Dix-Hallpike Right Duration 0   ? Dix-Hallpike Right Symptoms Right nystagmus   ?  ? Dix-Hallpike Left  ? Dix-Hallpike Left Duration 0   ? Dix-Hallpike Left Symptoms Right nystagmus   ?  ? Horizontal Canal Right  ? Horizontal Canal Right Duration 20 seconds   ? Horizontal Canal Right Symptoms Ageotrophic;Nystagmus   ?  ? Horizontal Canal Left  ? Horizontal Canal Left Duration 0   ? Horizontal Canal Left Symptoms Normal   ?  ? Positional Sensitivities  ? Sit to Supine No dizziness   ? Supine to Left Side No dizziness   ? Supine to Right Side Severe dizziness   ? Supine to Sitting No dizziness   ? Right Hallpike No dizziness   ? Up from Right Hallpike No dizziness   ? Up from Left Hallpike No dizziness   ? Nose to Right Knee No dizziness   ? Right Knee to Sitting No dizziness   ? Nose to Left Knee No dizziness   ? Left Knee to Sitting No dizziness   ? Head Turning x 5 Mild dizziness   ? Head Nodding x 5 No dizziness   ? Pivot Right in Standing Mild dizziness   ? Pivot Left in Standing Mild dizziness   ? Rolling Right Moderate dizziness   ? Rolling Left No dizziness   ? ?  ?  ? ?  ? ? ? ? ? ?Objective measurements completed on examination: See above findings.  ? ? ? ? ? ? ? ? ? ? ? ? ? ? ? ? PT Short Term Goals - 05/15/21 1900   ? ?  ? PT SHORT TERM GOAL #1  ? Title Complete FOTO for dizziness   ? Time 1   ? Period Weeks   ? Status New   ? Target Date 05/22/21   ? ?  ?  ? ?  ? ? ? ? PT Long Term Goals - 05/15/21 1900   ? ?  ? PT LONG TERM GOAL #1  ? Title Ind with progressed HEP for vestibular exercises   ? Time 6   ? Period Weeks   ?  Status New   ? Target Date 06/26/21   ?  ? PT LONG TERM GOAL #2  ? Title Report no dizziness/vertigo symptoms rolling over in bed.   ? Baseline severe dizziness rolling over in bed   ? Time 6   ? Period Weeks

## 2021-05-18 ENCOUNTER — Ambulatory Visit: Payer: Medicare PPO | Admitting: Physical Therapy

## 2021-05-18 ENCOUNTER — Other Ambulatory Visit: Payer: Self-pay

## 2021-05-18 DIAGNOSIS — H8111 Benign paroxysmal vertigo, right ear: Secondary | ICD-10-CM

## 2021-05-18 DIAGNOSIS — M542 Cervicalgia: Secondary | ICD-10-CM | POA: Diagnosis not present

## 2021-05-18 DIAGNOSIS — R42 Dizziness and giddiness: Secondary | ICD-10-CM

## 2021-05-18 DIAGNOSIS — H8110 Benign paroxysmal vertigo, unspecified ear: Secondary | ICD-10-CM | POA: Diagnosis not present

## 2021-05-18 NOTE — Therapy (Addendum)
[place] [facility] ?[address] ?[place], [place], [address] ?Phone: [phone]   Fax:  [phone] ? ?Physical Therapy Treatment ? ?Patient Details  ?Name: Brandy Dunlap [place] ?MRN: [id number] ?Date of Birth: 04/07/1950 ?Referring Provider (PT): [name] NP ? ? ?Encounter Date: 05/18/2021 ? ? PT End of Session - 05/18/21 0847   ? ? Visit Number 2   ? Number of Visits 12   ? Date for PT Re-Evaluation 06/26/21   ? Authorization Type [employer]   ? Progress Note Due on Visit 10   ? PT Start Time 0848   ? PT Stop Time 7591   ? PT Time Calculation (min) 39 min   ? Activity Tolerance Patient tolerated treatment well   ? Behavior During Therapy [facility] for tasks assessed/performed   ? ?  ?  ? ?  ? ? ?Past Medical History:  ?Diagnosis Date  ? Acute pain of left knee 10/02/2019  ? Allergic rhinitis 06/29/2009  ? Qualifier: Diagnosis of  By: Joyce Gross    ? Allergic rhinitis due to animal (cat) (dog) hair and dander 07/07/2020  ? Allergy   ? Asthma   ? Atypical chest pain 04/18/2017  ? B12 deficiency   ? Back pain   ? Body mass index (BMI) 35.0-35.9, adult 05/01/2019  ? Bronchitis   ? hx  ? 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  ? Chronic neck pain 03/01/2015  ? Colon polyp 10/06/2003  ? Depressive disorder 10/01/2016  ? PT DENIES  ? 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 Essential hypertension 10/08/2007  ? Qualifier: Diagnosis of  By: Arnoldo Morale MD, Balinda Quails   ? GERD (gastroesophageal reflux disease)   ? History of kidney stones   ? per 09-02-2018 pre-op eval , suspected kidney stone , to [facility] CT abd on 7-9- for further eval , passive  ? Hypertension   ? Infectious disease 05/23/2015  ? Low back pain   ? Osteoarthritis   ? Pneumonia   ? Pre-diabetes   ? Prediabetes 07/09/2016  ? Referred otalgia of left ear 03/26/2016  ? Sinus trouble   ? ? ?Past Surgical History:  ?Procedure Laterality Date  ? CHOLECYSTECTOMY    ? COLONOSCOPY    ? COLONOSCOPY W/  BIOPSIES  10/06/2003  ? LUMBAR FUSION  2008  ? LUMBAR FUSION    ? 2020  ? PARTIAL HIP ARTHROPLASTY Right 2009  ?  hip replacement  ? TONSILLECTOMY    ? TOTAL HIP ARTHROPLASTY Left 09/10/2018  ? Procedure: TOTAL HIP ARTHROPLASTY ANTERIOR APPROACH;  Surgeon: Gaynelle Arabian, MD;  Location: [facility];  Service: Orthopedics;  Laterality: Left;  ? TOTAL KNEE ARTHROPLASTY Right 11/16/2013  ? Procedure: RIGHT TOTAL KNEE ARTHROPLASTY;  Surgeon: Vickey Huger, MD;  Location: [facility];  Service: Orthopedics;  Laterality: Right;  ? VIDEO BRONCHOSCOPY Bilateral 12/05/2012  ? Procedure: VIDEO BRONCHOSCOPY WITHOUT FLUORO;  Surgeon: Collene Gobble, MD;  Location: [facility];  Service: Cardiopulmonary;  Laterality: Bilateral;  ? ? ?There were no vitals filed for this visit. ? ? Subjective Assessment - 05/18/21 0849   ? ? Subjective Patient reports she had a rough couple of days, more dizziness.  Not spinning but feels very off balance.   ? 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 Limitations Other (comment)   bed mobility  ? Patient Stated Goals decrease dizziness so can have surgery.   ?  Currently in Pain? No/denies   ? Pain Onset 1 to 4 weeks ago   ? ?  ?  ? ?  ? ? ? ? ? ? ? ? ? ? Vestibular Assessment - 05/18/21 0001   ? ?  ? Positional Testing  ? Dix-Hallpike Dix-Hallpike Right   ? Sidelying Test Sidelying Right   ?  ? Dix-Hallpike Right  ? Dix-Hallpike Right Duration 10   ? Dix-Hallpike Right Symptoms Upbeat, left rotatory nystagmus   ?  ? Horizontal Canal Right  ? Horizontal Canal Right Duration 10   ? Horizontal Canal Right Symptoms Geotrophic   ? ?  ?  ? ?  ? ? ? ? ? ? ? ? ? ? ? ? Vestibular Treatment/Exercise - 05/18/21 0001   ? ?  ? Vestibular Treatment/Exercise  ? Vestibular Treatment Provided Canalith Repositioning   ? Canalith Repositioning Epley Manuever Right;Canal Roll Right   ?  ?  EPLEY MANUEVER RIGHT  ? Number of Reps  2   ? Overall Response Improved  Symptoms   ? Response Details  no nystagmus/spinning second repetition. No spinning rolling over afterwards.   ?  ? Canal Roll Right  ? Number of Reps  2   ? Overall Response  Improved Symptoms   ? Response Details  Gufoni maneuover for R horizontal canal, decreased spinning/dizziness afterwards and negative roll test   ? ?  ?  ? ?  ? ? ? ? ? ? ? ? ? PT Education - 05/18/21 0940   ? ? Education Details education on precautions following CRP   ? Person(s) Educated Patient;Child(ren)   ? Methods Explanation;Demonstration   ? Comprehension Verbalized understanding   ? ?  ?  ? ?  ? ? ? PT Short Term Goals - 05/18/21 0939   ? ?  ? PT SHORT TERM GOAL #1  ? Title Complete FOTO for dizziness   ? Time 1   ? Period Weeks   ? Status New   ? Target Date 05/22/21   ?  ? PT SHORT TERM GOAL #2  ? Status On-going   ? ?  ?  ? ?  ? ? ? ? PT Long Term Goals - 05/18/21 0939   ? ?  ? PT LONG TERM GOAL #1  ? Title Ind with progressed HEP for vestibular exercises   ? Time 6   ? Period Weeks   ? Status On-going   ? Target Date 06/26/21   ?  ? PT LONG TERM GOAL #2  ? Title Report no dizziness/vertigo symptoms rolling over in bed.   ? Baseline severe dizziness rolling over in bed   ? Time 6   ? Period Weeks   ? Status On-going   ? Target Date 06/26/21   ?  ? PT LONG TERM GOAL #3  ? Title Report no dizziness/lightheadedness with position changes   ? Baseline dizzy standing quickly, pivot turning, and bending over   ? Time 6   ? Period Weeks   ? Status On-going   ? Target Date 06/26/21   ?  ? PT LONG TERM GOAL #4  ? Title Demonstrate no balance deficits with goal TBD by appropriate balance test.   ? Time 6   ? Period Weeks   ? Status On-going   ? Target Date 06/26/21   ? ?  ?  ? ?  ? ? ? ? ? ? ? ? Plan - 05/18/21 0935   ? ?  Clinical Impression Statement Patient reports today still spinning when rolling over in bed, but also feeling unsteady/off balance last few days after evaluation.  She is here today with driver and took meclizine.  She had  positive roll test to R again, performed Gufoni 2x, after which she did not have dizziness rolling to R, however, when repositioned back to midline after letting head drop, noted rotational nystagmus and dizziness.  Retested [employer] which was positive today, performed modified Epley to R 2x, reporting less dizziness with each repetition, afterwards no dizziness rolling and repositioning self in supine.  She was lightheaded afterwards, had daughter accompany her out of clinic for safety and given recommendations for safety and self care following canalith repositioning.  Will recheck next visit, if negative for BPPV will give VOR exercises if needed.   ? Personal Factors and Comorbidities Age;Comorbidity 3+   ? Comorbidities hearing loss R, L ear infection, osteoarthritis, HTN, asthma, LBP   ? Examination-Activity Limitations Bed Mobility;Bend;Transfers   ? Examination-Participation Restrictions Driving   ? Stability/Clinical Decision Making Evolving/Moderate complexity   ? Rehab Potential Excellent   ? PT Frequency 2x / week   ? PT Duration 6 weeks   ? PT Treatment/Interventions ADLs/Self Care Home Management;Canalith Repostioning;Moist Heat;Traction;Electrical Stimulation;Cryotherapy;Functional mobility training;Therapeutic activities;Therapeutic exercise;Balance training;Neuromuscular re-education;Passive range of motion;Manual techniques;Dry needling;Vestibular;Visual/perceptual remediation/compensation   ? PT Next Visit Plan CRP for horizontal canal, VOR exercises   ? Consulted and Agree with Plan of Care Patient   ? ?  ?  ? ?  ? ? ?Patient will benefit from skilled therapeutic intervention in order to improve the following deficits and impairments:  Decreased range of motion, Dizziness, Increased fascial restricitons, Pain, Impaired flexibility, Postural dysfunction, Decreased mobility ? ?Visit Diagnosis: ?BPPV (benign paroxysmal positional vertigo), right ? ?Dizziness and  giddiness ? ?Cervicalgia ? ? ? ? ?Problem List ?Patient Active Problem List  ? Diagnosis Date Noted  ? Benign paroxysmal positional vertigo 05/08/2021  ? Acute serous otitis media of left ear 05/08/2021  ? Preoperative examination 04/05/2021  ?

## 2021-05-22 ENCOUNTER — Other Ambulatory Visit: Payer: Self-pay

## 2021-05-22 ENCOUNTER — Ambulatory Visit: Payer: Medicare PPO | Admitting: Physical Therapy

## 2021-05-22 DIAGNOSIS — R42 Dizziness and giddiness: Secondary | ICD-10-CM

## 2021-05-22 DIAGNOSIS — M542 Cervicalgia: Secondary | ICD-10-CM

## 2021-05-22 DIAGNOSIS — H8111 Benign paroxysmal vertigo, right ear: Secondary | ICD-10-CM | POA: Diagnosis not present

## 2021-05-22 DIAGNOSIS — H8110 Benign paroxysmal vertigo, unspecified ear: Secondary | ICD-10-CM | POA: Diagnosis not present

## 2021-05-22 NOTE — Patient Instructions (Signed)
Access Code: [email or web address] ?URL: [email or web address] ?Date: 05/22/2021 ?Prepared by: Glenetta Hew ? ?Exercises ?- Seated Gaze Stabilization with Head Rotation  - 2 x daily - 7 x weekly - 3 sets - 15 reps ?- Seated Gaze Stabilization with Head Nod  - 2 x daily - 7 x weekly - 3 sets - 15 reps ?

## 2021-05-22 NOTE — Therapy (Addendum)
PHYSICAL THERAPY DISCHARGE SUMMARY ? ?Visits from Start of Care: 3 ? ?Current functional level related to goals / functional outcomes: ?From note on 05/22/21 "Patient reported significant improvement in dizziness, with no incidentes of dizziness rolling over in bed since last visit.  She did report headache and still demonstrated very guarded neck movments.  Retested all canalas - noted brief  (1 second) nystagmus and dizziness with R posterior canal, after CRP no nystagmus or symptoms... Based on progress, patient is being placed on 30 day hold, discharge if symptoms do not return.  ?  ?Remaining deficits: ?Decreased vertical tracking, given VOR exercises.   ?  ?Education / Equipment: ?HEP  ?Plan: ?Patient agrees to discharge. Patient is being discharged due to meeting the stated rehab goals.    ? ?Rennie Natter, PT, DPT 06/26/2021 8:41AM ? ? ?[place] [facility] ?[address] ?[place], [place], [address] ?Phone: [phone]   Fax:  [phone] ? ?Physical Therapy Treatment ? ?Patient Details  ?Name: Brandy Dunlap ?MRN: [id number] ?Date of Birth: 14-Jun-1950 ?Referring Provider (PT): [name] NP ? ? ?Encounter Date: 05/22/2021 ? ? PT End of Session - 05/22/21 1609   ? ? Visit Number 3   ? Number of Visits 12   ? Date for PT Re-Evaluation 06/26/21   ? Authorization Type [employer]   ? Progress Note Due on Visit 10   ? PT Start Time 1535   ? PT Stop Time 3474   ? PT Time Calculation (min) 34 min   ? Activity Tolerance Patient tolerated treatment well   ? Behavior During Therapy [facility] for tasks assessed/performed   ? ?  ?  ? ?  ? ? ?Past Medical History:  ?Diagnosis Date  ? Acute pain of left knee 10/02/2019  ? Allergic rhinitis 06/29/2009  ? Qualifier: Diagnosis of  By: Joyce Gross    ? Allergic rhinitis due to animal (cat) (dog) hair and dander 07/07/2020  ? Allergy   ? Asthma   ? Atypical chest pain 04/18/2017  ? B12 deficiency   ? Back  pain   ? Body mass index (BMI) 35.0-35.9, adult 05/01/2019  ? Bronchitis   ? hx  ? 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  ? Chronic neck pain 03/01/2015  ? Colon polyp 10/06/2003  ? Depressive disorder 10/01/2016  ? PT DENIES  ? 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 Essential hypertension 10/08/2007  ? Qualifier: Diagnosis of  By: Arnoldo Morale MD, Balinda Quails   ? GERD (gastroesophageal reflux disease)   ? History of kidney stones   ? per 09-02-2018 pre-op eval , suspected kidney stone , to [facility] CT abd on 7-9- for further eval , passive  ? Hypertension   ? Infectious disease 05/23/2015  ? Low back pain   ? Osteoarthritis   ? Pneumonia   ? Pre-diabetes   ? Prediabetes 07/09/2016  ? Referred otalgia of left ear 03/26/2016  ? Sinus trouble   ? ? ?Past Surgical History:  ?Procedure Laterality Date  ? CHOLECYSTECTOMY    ? COLONOSCOPY    ? COLONOSCOPY W/ BIOPSIES  10/06/2003  ? LUMBAR FUSION  2008  ? LUMBAR FUSION    ? 2020  ? PARTIAL HIP ARTHROPLASTY Right 2009  ?  hip replacement  ? TONSILLECTOMY    ? TOTAL HIP ARTHROPLASTY Left 09/10/2018  ? Procedure: TOTAL HIP ARTHROPLASTY ANTERIOR APPROACH;  Surgeon: Gaynelle Arabian, MD;  Location: [facility];  Service: Orthopedics;  Laterality:  Left;  ? TOTAL KNEE ARTHROPLASTY Right 11/16/2013  ? Procedure: RIGHT TOTAL KNEE ARTHROPLASTY;  Surgeon: Vickey Huger, MD;  Location: [place];  Service: Orthopedics;  Laterality: Right;  ? VIDEO BRONCHOSCOPY Bilateral 12/05/2012  ? Procedure: VIDEO BRONCHOSCOPY WITHOUT FLUORO;  Surgeon: Collene Gobble, MD;  Location: [facility];  Service: Cardiopulmonary;  Laterality: Bilateral;  ? ? ?There were no vitals filed for this visit. ? ? ? ? ? ? ? ? ? ? ? Vestibular Assessment - 05/22/21 0001   ? ?  ? Positional Testing  ? Dix-Hallpike Dix-Hallpike Right;Dix-Hallpike Left   ? Sidelying Test Sidelying Right;Sidelying Left   ?  ? Dix-Hallpike Right  ? Dix-Hallpike Right Duration 1   ? Dix-Hallpike Right Symptoms Upbeat, left rotatory nystagmus   ?  ? Dix-Hallpike  Left  ? Dix-Hallpike Left Duration 0   ? Dix-Hallpike Left Symptoms No nystagmus   ?  ? Sidelying Right  ? Sidelying Right Duration 0   ? Sidelying Right Symptoms No nystagmus   ?  ? Horizontal Canal Right  ? Horizontal Canal Right Duration 0   ? Horizontal Canal Right Symptoms Normal   ?  ? Horizontal Canal Left  ? Horizontal Canal Left Duration 0   ? Horizontal Canal Left Symptoms Normal   ? ?  ?  ? ?  ? ? ? ? ? ? ? ? ? ? ? [place] Adult PT Treatment/Exercise - 05/22/21 0001   ? ?  ? Neuro Re-ed   ? Neuro Re-ed Details  see vestibular Tx   ?  ? Manual Therapy  ? Manual Therapy Soft tissue mobilization   ? Manual therapy comments in sitting since post CRP   ? Soft tissue mobilization STM to cervical paraspinals, levator, UT bil   ? ?  ?  ? ?  ? ? Vestibular Treatment/Exercise - 05/22/21 0001   ? ?  ? Vestibular Treatment/Exercise  ? Vestibular Treatment Provided Canalith Repositioning;Gaze   ? Canalith Repositioning Epley Manuever Right   ? Habituation Exercises Seated Horizontal Head Turns;Seated Vertical Head Turns   ?  ?  EPLEY MANUEVER RIGHT  ? Number of Reps  1   ? Overall Response Symptoms Resolved   ? Response Details  no nystagmus/spinning .  Negative dix hallpike   ?  ? Seated Horizontal Head Turns  ? Number of Reps  15   ? Symptom Description  no dizziness, headache worsened   ?  ? Seated Vertical Head Turns  ? Number of Reps  15   ? Symptom Description  poor tracking, no dizziness   ? ?  ?  ? ?  ? ? ? ? ? ? ? ? ? PT Education - 05/22/21 1610   ? ? Education Details VOR exercises Access Code: [email or web address]   ? Person(s) Educated Patient   ? Methods Explanation;Demonstration;Verbal cues;Handout   ? Comprehension Verbalized understanding;Returned demonstration   ? ?  ?  ? ?  ? ? ? PT Short Term Goals - 05/22/21 1613   ? ?  ? PT SHORT TERM GOAL #1  ? Title Complete FOTO for dizziness   ? Time 1   ? Period Weeks   ? Status Not Met   ? Target Date 05/22/21   ? ?  ?  ? ?  ? ? ? ? PT Long Term Goals - 05/22/21 1614    ? ?  ? PT LONG TERM GOAL #1  ? Title Ind with progressed HEP for vestibular  exercises   ? Time 6   ? Period Weeks   ? Status On-going   05/22/21- VOR exercises given  ? Target Date 06/26/21   ?  ? PT LONG TERM GOAL #2  ? Title Report no dizziness/vertigo symptoms rolling over in bed.   ? Baseline severe dizziness rolling over in bed   ? Time 6   ? Period Weeks   ? Status Achieved   ? Target Date 06/26/21   ?  ? PT LONG TERM GOAL #3  ? Title Report no dizziness/lightheadedness with position changes   ? Baseline dizzy standing quickly, pivot turning, and bending over   ? Time 6   ? Period Weeks   ? Status Partially Met   05/22/21- occasional lightheadedness from bending over still  ? Target Date 06/26/21   ?  ? PT LONG TERM GOAL #4  ? Title Demonstrate no balance deficits with goal TBD by appropriate balance test.   ? Time 6   ? Period Weeks   ? Status Deferred   05/22/21- patient reports no unsteadiness, being placed on hold as having TKA in 2 weeks.  ? Target Date 06/26/21   ? ?  ?  ? ?  ? ? ? ? ? ? ? ? Plan - 05/22/21 1611   ? ? Clinical Impression Statement Patient reported significant improvement in dizziness, with no incidentes of dizziness rolling over in bed since last visit.  She did report headache and still demonstrated very guarded neck movments.  Retested all canalas - noted brief  (1 second) nystagmus and dizziness with R posterior canal, after CRP no nystagmus or symptoms.  Still noted poor tracking especially with vertical eye movements, so given VOR exercises today in sitting.  End of session manual therapy in sitting, patient reported decreased headache following.  Based on progress, patient is being placed on 30 day hold, discharge if symptoms do not return.   ? Personal Factors and Comorbidities Age;Comorbidity 3+   ? Comorbidities hearing loss R, L ear infection, osteoarthritis, HTN, asthma, LBP   ? Examination-Activity Limitations Bed Mobility;Bend;Transfers   ? Examination-Participation  Restrictions Driving   ? Stability/Clinical Decision Making Evolving/Moderate complexity   ? Rehab Potential Excellent   ? PT Frequency 2x / week   ? PT Duration 6 weeks   ? PT Treatment/Interventions ADLs/Self Care

## 2021-05-23 DIAGNOSIS — J4540 Moderate persistent asthma, uncomplicated: Secondary | ICD-10-CM | POA: Diagnosis not present

## 2021-05-27 HISTORY — PX: OTHER SURGICAL HISTORY: SHX169

## 2021-05-31 ENCOUNTER — Encounter: Payer: Medicare PPO | Admitting: Physical Therapy

## 2021-06-05 ENCOUNTER — Encounter: Payer: Medicare PPO | Admitting: Physical Therapy

## 2021-06-07 ENCOUNTER — Encounter: Payer: Medicare PPO | Admitting: Physical Therapy

## 2021-06-07 ENCOUNTER — Other Ambulatory Visit: Payer: Self-pay

## 2021-06-07 DIAGNOSIS — D509 Iron deficiency anemia, unspecified: Secondary | ICD-10-CM

## 2021-06-08 ENCOUNTER — Inpatient Hospital Stay: Payer: Medicare PPO | Admitting: Hematology and Oncology

## 2021-06-08 ENCOUNTER — Inpatient Hospital Stay: Payer: Medicare PPO | Attending: Hematology and Oncology

## 2021-06-08 ENCOUNTER — Encounter: Payer: Self-pay | Admitting: Hematology and Oncology

## 2021-06-08 ENCOUNTER — Other Ambulatory Visit: Payer: Self-pay

## 2021-06-08 VITALS — BP 144/61 | HR 83 | Temp 97.5°F | Resp 19 | Ht 63.0 in | Wt 190.8 lb

## 2021-06-08 DIAGNOSIS — J4540 Moderate persistent asthma, uncomplicated: Secondary | ICD-10-CM | POA: Diagnosis not present

## 2021-06-08 DIAGNOSIS — D509 Iron deficiency anemia, unspecified: Secondary | ICD-10-CM | POA: Diagnosis not present

## 2021-06-08 DIAGNOSIS — Z79899 Other long term (current) drug therapy: Secondary | ICD-10-CM | POA: Insufficient documentation

## 2021-06-08 LAB — CMP (CANCER CENTER ONLY)
ALT: 44 U/L (ref 0–44)
AST: 44 U/L — ABNORMAL HIGH (ref 15–41)
Albumin: 4.2 g/dL (ref 3.5–5.0)
Alkaline Phosphatase: 67 U/L (ref 38–126)
Anion gap: 9 (ref 5–15)
BUN: 10 mg/dL (ref 8–23)
CO2: 25 mmol/L (ref 22–32)
Calcium: 9.5 mg/dL (ref 8.9–10.3)
Chloride: 104 mmol/L (ref 98–111)
Creatinine: 0.71 mg/dL (ref 0.44–1.00)
GFR, Estimated: >60 mL/min (ref >60)
Glucose, Bld: 118 mg/dL — ABNORMAL HIGH (ref 70–99)
Potassium: 3.7 mmol/L (ref 3.5–5.1)
Sodium: 138 mmol/L (ref 135–145)
Total Bilirubin: 0.6 mg/dL (ref 0.3–1.2)
Total Protein: 7.3 g/dL (ref 6.5–8.1)

## 2021-06-08 LAB — IRON AND IRON BINDING CAPACITY (CC-WL,HP ONLY)
Iron: 127 ug/dL (ref 28–170)
Saturation Ratios: 29 % (ref 10.4–31.8)
TIBC: 441 ug/dL (ref 250–450)
UIBC: 314 ug/dL (ref 148–442)

## 2021-06-08 LAB — CBC WITH DIFFERENTIAL (CANCER CENTER ONLY)
Abs Immature Granulocytes: 0.03 10*3/uL (ref 0.00–0.07)
Basophils Absolute: 0.1 10*3/uL (ref 0.0–0.1)
Basophils Relative: 2 %
Eosinophils Absolute: 0.5 10*3/uL (ref 0.0–0.5)
Eosinophils Relative: 6 %
HCT: 43.2 % (ref 36.0–46.0)
Hemoglobin: 14.5 g/dL (ref 12.0–15.0)
Immature Granulocytes: 0 %
Lymphocytes Relative: 44 %
Lymphs Abs: 3.5 10*3/uL (ref 0.7–4.0)
MCH: 30 pg (ref 26.0–34.0)
MCHC: 33.6 g/dL (ref 30.0–36.0)
MCV: 89.4 fL (ref 80.0–100.0)
Monocytes Absolute: 0.5 10*3/uL (ref 0.1–1.0)
Monocytes Relative: 7 %
Neutro Abs: 3.3 10*3/uL (ref 1.7–7.7)
Neutrophils Relative %: 41 %
Platelet Count: 275 10*3/uL (ref 150–400)
RBC: 4.83 MIL/uL (ref 3.87–5.11)
RDW: 13.3 % (ref 11.5–15.5)
WBC Count: 8 10*3/uL (ref 4.0–10.5)
nRBC: 0 % (ref 0.0–0.2)

## 2021-06-08 LAB — SAVE SMEAR(SSMR), FOR PROVIDER SLIDE REVIEW

## 2021-06-08 LAB — FERRITIN: Ferritin: 183 ng/mL (ref 11–307)

## 2021-06-08 NOTE — Progress Notes (Signed)
[place] ?CONSULT NOTE ? ?Patient Care Team: ?[name], NP as PCP - General (Internal Medicine) ? ?CHIEF COMPLAINTS/PURPOSE OF CONSULTATION:  ?IDA ? ?ASSESSMENT & PLAN:  ? ?This is a very pleasant 71 year old female patient with hypertension, iron deficiency who was referred to hematology for the same.  Since last visit, she received intravenous Venofer which she tolerated well except for severe nausea.  After addition of premedication, she tolerated rest of the cycles well.  She also had endoscopy and colonoscopy which did not show any evidence for malignancy.   ? ? ?HISTORY OF PRESENTING ILLNESS:  ?Brandy Dunlap 71 y.o. female is here because of IDA. ? ?This is a very pleasant 71 year old female patient with past medical history significant for hip replacement, iron deficiency anemia referred to hematology for evaluation of iron deficiency anemia.  ?She is feeling well, back to exercise. No PICA ?Around 3 weeks ago, she had BPPV, did PT to reposition the crystals. This has resolved. ?She had left knee replacement coming up on Monday. ?Rest of the pertinent 10 point ROS reviewed and negative. ? ? ?MEDICAL HISTORY:  ?Past Medical History:  ?Diagnosis Date  ? Acute pain of left knee 10/02/2019  ? Allergic rhinitis 06/29/2009  ? Qualifier: Diagnosis of  By: Joyce Gross    ? Allergic rhinitis due to animal (cat) (dog) hair and dander 07/07/2020  ? Allergy   ? Asthma   ? Atypical chest pain 04/18/2017  ? B12 deficiency   ? Back pain   ? Body mass index (BMI) 35.0-35.9, adult 05/01/2019  ? Bronchitis   ? hx  ? 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  ? Chronic neck pain 03/01/2015  ? Colon polyp 10/06/2003  ? Depressive disorder 10/01/2016  ? PT DENIES  ? 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 Essential hypertension 10/08/2007  ? Qualifier: Diagnosis of  By: Arnoldo Morale MD, Balinda Quails   ? GERD (gastroesophageal reflux disease)   ? History of kidney stones   ? per 09-02-2018 pre-op eval ,  suspected kidney stone , to [facility] CT abd on 7-9- for further eval , passive  ? Hypertension   ? Infectious disease 05/23/2015  ? Low back pain   ? Osteoarthritis   ? Pneumonia   ? Pre-diabetes   ? Prediabetes 07/09/2016  ? Referred otalgia of left ear 03/26/2016  ? Sinus trouble   ? ? ?SURGICAL HISTORY: ?Past Surgical History:  ?Procedure Laterality Date  ? CHOLECYSTECTOMY    ? COLONOSCOPY    ? COLONOSCOPY W/ BIOPSIES  10/06/2003  ? LUMBAR FUSION  2008  ? LUMBAR FUSION    ? 2020  ? PARTIAL HIP ARTHROPLASTY Right 2009  ?  hip replacement  ? TONSILLECTOMY    ? TOTAL HIP ARTHROPLASTY Left 09/10/2018  ? Procedure: TOTAL HIP ARTHROPLASTY ANTERIOR APPROACH;  Surgeon: Gaynelle Arabian, MD;  Location: [facility];  Service: Orthopedics;  Laterality: Left;  ? TOTAL KNEE ARTHROPLASTY Right 11/16/2013  ? Procedure: RIGHT TOTAL KNEE ARTHROPLASTY;  Surgeon: Vickey Huger, MD;  Location: [place];  Service: Orthopedics;  Laterality: Right;  ? VIDEO BRONCHOSCOPY Bilateral 12/05/2012  ? Procedure: VIDEO BRONCHOSCOPY WITHOUT FLUORO;  Surgeon: Collene Gobble, MD;  Location: [facility];  Service: Cardiopulmonary;  Laterality: Bilateral;  ? ? ?SOCIAL HISTORY: ?Social History  ? ?Socioeconomic History  ? Marital status: Single  ?  Spouse name: Not on file  ? Number of children: 1  ? Years of education: Not on file  ? Highest education level: Not  on file  ?Occupational History  ? Occupation: Pharmacist, hospital  ?  Employer: [employer]  ?Tobacco Use  ? Smoking status: Never  ?  Passive exposure: Never  ? Smokeless tobacco: Never  ?Vaping Use  ? Vaping Use: Never used  ?Substance and Sexual Activity  ? Alcohol use: No  ? Drug use: No  ? Sexual activity: Not Currently  ?  Partners: Male  ?  Birth control/protection: Post-menopausal  ?Other Topics Concern  ? Not on file  ?Social History Narrative  ? Divorced  ? One daughter- lives locally and one grandson  ? Retired Pharmacist, hospital,  Has masters degree  ? Enjoys reading, spending time with her grandson   ? ?Social Determinants of Health  ? ?Financial Resource Strain: Low Risk   ? Difficulty of Paying Living Expenses: Not hard at all  ?Food Insecurity: No Food Insecurity  ? Worried About Charity fundraiser in the Last Year: Never true  ? Ran Out of Food in the Last Year: Never true  ?Transportation Needs: No Transportation Needs  ? Lack of Transportation (Medical): No  ? Lack of Transportation (Non-Medical): No  ?Physical Activity: Insufficiently Active  ? Days of Exercise per Week: 3 days  ? Minutes of Exercise per Session: 40 min  ?Stress: No Stress Concern Present  ? Feeling of Stress : Not at all  ?Social Connections: Moderately Isolated  ? Frequency of Communication with Friends and Family: More than three times a week  ? Frequency of Social Gatherings with Friends and Family: More than three times a week  ? Attends Religious Services: More than 4 times per year  ? Active Member of Clubs or Organizations: No  ? Attends Archivist Meetings: Never  ? Marital Status: Never married  ?Intimate Partner Violence: Not At Risk  ? Fear of Current or Ex-Partner: No  ? Emotionally Abused: No  ? Physically Abused: No  ? Sexually Abused: No  ? ? ?FAMILY HISTORY: ?Family History  ?Problem Relation Age of Onset  ? COPD Mother   ? Heart disease Mother   ? Ovarian cancer Mother   ? Emphysema Mother   ? Hypertension Mother   ? Hyperlipidemia Mother   ? Heart Problems Mother   ? Stroke Mother   ? Thyroid disease Mother   ? Cancer Mother   ? COPD Father   ?     died at age 33  ? Emphysema Father   ? Heart Problems Father   ? Heart disease Brother   ?     stent with 90%  ? Breast cancer Maternal Aunt   ? Colon cancer Neg Hx   ? Esophageal cancer Neg Hx   ? Rectal cancer Neg Hx   ? Stomach cancer Neg Hx   ? Colon polyps Neg Hx   ? ? ?ALLERGIES:  is allergic to augmentin [amoxicillin-pot clavulanate] and pantoprazole sodium. ? ?MEDICATIONS:  ?Current Outpatient Medications  ?Medication Sig Dispense Refill  ? montelukast  (SINGULAIR) 10 MG tablet Take 10 mg by mouth once.    ? Albuterol Sulfate, sensor, (PROAIR DIGIHALER) 108 (90 Base) MCG/ACT AEPB Inhale into the lungs.    ? amLODipine (NORVASC) 5 MG tablet TAKE 1 TABLET (5 MG TOTAL) BY MOUTH DAILY. 90 tablet 1  ? budesonide-formoterol (SYMBICORT) 160-4.5 MCG/ACT inhaler Inhale 2 puffs into the lungs 2 (two) times daily.    ? DEXILANT 60 MG capsule TAKE 1 CAPSULE BY MOUTH EVERY DAY 90 capsule 1  ?  diclofenac sodium (VOLTAREN) 1 % GEL Apply 2 g topically 4 (four) times daily as needed. (Patient taking differently: Apply 2 g topically 4 (four) times daily as needed (L knee pain).) 100 g 3  ? EPINEPHrine 0.3 mg/0.3 mL IJ SOAJ injection Inject 0.3 mg into the muscle as needed for anaphylaxis.    ? meclizine (ANTIVERT) 12.5 MG tablet Take 1 tablet (12.5 mg total) by mouth 3 (three) times daily as needed for dizziness. 30 tablet 0  ? montelukast (SINGULAIR) 10 MG tablet Take 1 tablet (10 mg total) by mouth at bedtime. 30 tablet 3  ? omalizumab (XOLAIR) 150 MG injection Inject 150 mg into the skin every 14 (fourteen) days.    ? traMADol (ULTRAM) 50 MG tablet Take 50-100 mg by mouth every 6 (six) hours as needed for pain.    ? ?No current facility-administered medications for this visit.  ? ? ?PHYSICAL EXAMINATION: ? ?ECOG PERFORMANCE STATUS: 0 - Asymptomatic ? ?Vitals:  ? 06/08/21 1352  ?BP: (!) 144/61  ?Pulse: 83  ?Resp: 19  ?Temp: (!) 97.5 ?F (36.4 ?C)  ?SpO2: 97%  ? ?Filed Weights  ? 06/08/21 1352  ?Weight: 190 lb 12.8 oz (86.5 kg)  ? ? ?GENERAL:alert, no distress and comfortable ?SKIN: skin color, texture, turgor are normal, no rashes or significant lesions ?EYES: normal, conjunctiva are pink and non-injected, sclera clear ?OROPHARYNX:no exudate, no erythema and lips, buccal mucosa, and tongue normal  ?NECK: supple, thyroid normal size, non-tender, without nodularity ?LYMPH:  no palpable lymphadenopathy in the cervical, axillary or inguinal ?LUNGS: clear to auscultation and percussion  with normal breathing effort ?HEART: regular rate & rhythm and no murmurs and no lower extremity edema ?ABDOMEN:abdomen soft, non-tender and normal bowel sounds ?Musculoskeletal:no cyanosis of digits and no clubbin

## 2021-06-08 NOTE — Progress Notes (Signed)
[place] ?CONSULT NOTE ? ?Patient Care Team: ?[name], NP as PCP - General (Internal Medicine) ? ?CHIEF COMPLAINTS/PURPOSE OF CONSULTATION:  ?IDA ? ?ASSESSMENT & PLAN:  ? ?This is a very pleasant 71 year old female patient with hypertension, iron deficiency who is here for follow-up on iron deficiency anemia since last visit, she received intravenous Venofer which she tolerated well except for severe nausea.  After addition of premedication, she tolerated rest of the cycles well.  She also had endoscopy and colonoscopy which did not show any evidence for malignancy.   ?She continues to feel very well, no complaints. ?Physical examination today unremarkable.  CBC from today showed significant improvement in hemoglobin, iron panel pending. ?Final labs from today are unremarkable, she can continue follow-up with [name] in 1 year. ?Last mammogram negative for malignancy ?She was encouraged to contact us with any new questions or concerns.  Age-appropriate cancer screening discussed.  Thank you for consulting [name] in the care of this patient.  Please do not hesitate to contact us with any additional questions or concerns. ? ?HISTORY OF PRESENTING ILLNESS:  ?Brandy Dunlap 71 y.o. female is here because of IDA. ? ?This is a very pleasant 71 year old female patient with past medical history significant for hip replacement, iron deficiency anemia referred to hematology for evaluation of iron deficiency anemia.  ?She feels very well, well exercising regularly, anticipating left knee replacement on Monday.  No pica no hematochezia or melena ?Rest of the pertinent 10 point ROS reviewed and negative. ? ? ?MEDICAL HISTORY:  ?Past Medical History:  ?Diagnosis Date  ? Acute pain of left knee 10/02/2019  ? Allergic rhinitis 06/29/2009  ? Qualifier: Diagnosis of  By: Joyce Gross    ? Allergic rhinitis due to animal (cat) (dog) hair and dander 07/07/2020  ? Allergy   ? Asthma   ? Atypical chest pain  04/18/2017  ? B12 deficiency   ? Back pain   ? Body mass index (BMI) 35.0-35.9, adult 05/01/2019  ? Bronchitis   ? hx  ? 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  ? Chronic neck pain 03/01/2015  ? Colon polyp 10/06/2003  ? Depressive disorder 10/01/2016  ? PT DENIES  ? 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 Essential hypertension 10/08/2007  ? Qualifier: Diagnosis of  By: Arnoldo Morale MD, Balinda Quails   ? GERD (gastroesophageal reflux disease)   ? History of kidney stones   ? per 09-02-2018 pre-op eval , suspected kidney stone , to [facility] abd on 7-9- for further eval , passive  ? Hypertension   ? Infectious disease 05/23/2015  ? Low back pain   ? Osteoarthritis   ? Pneumonia   ? Pre-diabetes   ? Prediabetes 07/09/2016  ? Referred otalgia of left ear 03/26/2016  ? Sinus trouble   ? ? ?SURGICAL HISTORY: ?Past Surgical History:  ?Procedure Laterality Date  ? CHOLECYSTECTOMY    ? COLONOSCOPY    ? COLONOSCOPY W/ BIOPSIES  10/06/2003  ? LUMBAR FUSION  2008  ? LUMBAR FUSION    ? 2020  ? PARTIAL HIP ARTHROPLASTY Right 2009  ?  hip replacement  ? TONSILLECTOMY    ? TOTAL HIP ARTHROPLASTY Left 09/10/2018  ? Procedure: TOTAL HIP ARTHROPLASTY ANTERIOR APPROACH;  Surgeon: Gaynelle Arabian, MD;  Location: [facility];  Service: Orthopedics;  Laterality: Left;  ? TOTAL KNEE ARTHROPLASTY Right 11/16/2013  ? Procedure: RIGHT TOTAL KNEE ARTHROPLASTY;  Surgeon: Vickey Huger, MD;  Location: [place];  Service: Orthopedics;  Laterality: Right;  ?  VIDEO BRONCHOSCOPY Bilateral 12/05/2012  ? Procedure: VIDEO BRONCHOSCOPY WITHOUT FLUORO;  Surgeon: Collene Gobble, MD;  Location: [facility];  Service: Cardiopulmonary;  Laterality: Bilateral;  ? ? ?SOCIAL HISTORY: ?Social History  ? ?Socioeconomic History  ? Marital status: Single  ?  Spouse name: Not on file  ? Number of children: 1  ? Years of education: Not on file  ? Highest education level: Not on file  ?Occupational History  ? Occupation: Pharmacist, hospital  ?  Employer: [employer]  ?Tobacco Use  ? Smoking  status: Never  ?  Passive exposure: Never  ? Smokeless tobacco: Never  ?Vaping Use  ? Vaping Use: Never used  ?Substance and Sexual Activity  ? Alcohol use: No  ? Drug use: No  ? Sexual activity: Not Currently  ?  Partners: Male  ?  Birth control/protection: Post-menopausal  ?Other Topics Concern  ? Not on file  ?Social History Narrative  ? Divorced  ? One daughter- lives locally and one grandson  ? Retired Pharmacist, hospital,  Has masters degree  ? Enjoys reading, spending time with her grandson  ? ?Social Determinants of Health  ? ?Financial Resource Strain: Low Risk   ? Difficulty of Paying Living Expenses: Not hard at all  ?Food Insecurity: No Food Insecurity  ? Worried About Charity fundraiser in the Last Year: Never true  ? Ran Out of Food in the Last Year: Never true  ?Transportation Needs: No Transportation Needs  ? Lack of Transportation (Medical): No  ? Lack of Transportation (Non-Medical): No  ?Physical Activity: Insufficiently Active  ? Days of Exercise per Week: 3 days  ? Minutes of Exercise per Session: 40 min  ?Stress: No Stress Concern Present  ? Feeling of Stress : Not at all  ?Social Connections: Moderately Isolated  ? Frequency of Communication with Friends and Family: More than three times a week  ? Frequency of Social Gatherings with Friends and Family: More than three times a week  ? Attends Religious Services: More than 4 times per year  ? Active Member of Clubs or Organizations: No  ? Attends Archivist Meetings: Never  ? Marital Status: Never married  ?Intimate Partner Violence: Not At Risk  ? Fear of Current or Ex-Partner: No  ? Emotionally Abused: No  ? Physically Abused: No  ? Sexually Abused: No  ? ? ?FAMILY HISTORY: ?Family History  ?Problem Relation Age of Onset  ? COPD Mother   ? Heart disease Mother   ? Ovarian cancer Mother   ? Emphysema Mother   ? Hypertension Mother   ? Hyperlipidemia Mother   ? Heart Problems Mother   ? Stroke Mother   ? Thyroid disease Mother   ? Cancer Mother    ? COPD Father   ?     died at age 8  ? Emphysema Father   ? Heart Problems Father   ? Heart disease Brother   ?     stent with 90%  ? Breast cancer Maternal Aunt   ? Colon cancer Neg Hx   ? Esophageal cancer Neg Hx   ? Rectal cancer Neg Hx   ? Stomach cancer Neg Hx   ? Colon polyps Neg Hx   ? ? ?ALLERGIES:  is allergic to augmentin [amoxicillin-pot clavulanate] and pantoprazole sodium. ? ?MEDICATIONS:  ?Current Outpatient Medications  ?Medication Sig Dispense Refill  ? montelukast (SINGULAIR) 10 MG tablet Take 10 mg by mouth once.    ? Albuterol  Sulfate, sensor, (PROAIR DIGIHALER) 108 (90 Base) MCG/ACT AEPB Inhale into the lungs.    ? amLODipine (NORVASC) 5 MG tablet TAKE 1 TABLET (5 MG TOTAL) BY MOUTH DAILY. 90 tablet 1  ? budesonide-formoterol (SYMBICORT) 160-4.5 MCG/ACT inhaler Inhale 2 puffs into the lungs 2 (two) times daily.    ? DEXILANT 60 MG capsule TAKE 1 CAPSULE BY MOUTH EVERY DAY 90 capsule 1  ? diclofenac sodium (VOLTAREN) 1 % GEL Apply 2 g topically 4 (four) times daily as needed. (Patient taking differently: Apply 2 g topically 4 (four) times daily as needed (L knee pain).) 100 g 3  ? EPINEPHrine 0.3 mg/0.3 mL IJ SOAJ injection Inject 0.3 mg into the muscle as needed for anaphylaxis.    ? meclizine (ANTIVERT) 12.5 MG tablet Take 1 tablet (12.5 mg total) by mouth 3 (three) times daily as needed for dizziness. 30 tablet 0  ? montelukast (SINGULAIR) 10 MG tablet Take 1 tablet (10 mg total) by mouth at bedtime. 30 tablet 3  ? omalizumab (XOLAIR) 150 MG injection Inject 150 mg into the skin every 14 (fourteen) days.    ? traMADol (ULTRAM) 50 MG tablet Take 50-100 mg by mouth every 6 (six) hours as needed for pain.    ? ?No current facility-administered medications for this visit.  ? ? ?PHYSICAL EXAMINATION: ? ?ECOG PERFORMANCE STATUS: 0 - Asymptomatic ? ?Vitals:  ? 06/08/21 1352  ?BP: (!) 144/61  ?Pulse: 83  ?Resp: 19  ?Temp: (!) 97.5 ?F (36.4 ?C)  ?SpO2: 97%  ? ?Filed Weights  ? 06/08/21 1352  ?Weight:  190 lb 12.8 oz (86.5 kg)  ? ? ?GENERAL:alert, no distress and comfortable ?SKIN: skin color, texture, turgor are normal, no rashes or significant lesions ?EYES: normal, conjunctiva are pink and non-injec

## 2021-06-12 ENCOUNTER — Encounter: Payer: Medicare PPO | Admitting: Physical Therapy

## 2021-06-12 DIAGNOSIS — G8918 Other acute postprocedural pain: Secondary | ICD-10-CM | POA: Diagnosis not present

## 2021-06-12 DIAGNOSIS — M1712 Unilateral primary osteoarthritis, left knee: Secondary | ICD-10-CM | POA: Diagnosis not present

## 2021-06-22 DIAGNOSIS — M25462 Effusion, left knee: Secondary | ICD-10-CM | POA: Diagnosis not present

## 2021-06-22 DIAGNOSIS — Z7409 Other reduced mobility: Secondary | ICD-10-CM | POA: Diagnosis not present

## 2021-06-22 DIAGNOSIS — Z96652 Presence of left artificial knee joint: Secondary | ICD-10-CM | POA: Diagnosis not present

## 2021-06-22 DIAGNOSIS — M25662 Stiffness of left knee, not elsewhere classified: Secondary | ICD-10-CM | POA: Diagnosis not present

## 2021-06-22 DIAGNOSIS — M25562 Pain in left knee: Secondary | ICD-10-CM | POA: Diagnosis not present

## 2021-06-22 DIAGNOSIS — R29898 Other symptoms and signs involving the musculoskeletal system: Secondary | ICD-10-CM | POA: Diagnosis not present

## 2021-06-26 DIAGNOSIS — R29898 Other symptoms and signs involving the musculoskeletal system: Secondary | ICD-10-CM | POA: Diagnosis not present

## 2021-06-26 DIAGNOSIS — M25662 Stiffness of left knee, not elsewhere classified: Secondary | ICD-10-CM | POA: Diagnosis not present

## 2021-06-26 DIAGNOSIS — M25562 Pain in left knee: Secondary | ICD-10-CM | POA: Diagnosis not present

## 2021-06-26 DIAGNOSIS — Z96652 Presence of left artificial knee joint: Secondary | ICD-10-CM | POA: Diagnosis not present

## 2021-06-26 DIAGNOSIS — Z7409 Other reduced mobility: Secondary | ICD-10-CM | POA: Diagnosis not present

## 2021-06-26 DIAGNOSIS — M25462 Effusion, left knee: Secondary | ICD-10-CM | POA: Diagnosis not present

## 2021-06-29 DIAGNOSIS — M25562 Pain in left knee: Secondary | ICD-10-CM | POA: Diagnosis not present

## 2021-06-29 DIAGNOSIS — Z96652 Presence of left artificial knee joint: Secondary | ICD-10-CM | POA: Diagnosis not present

## 2021-06-29 DIAGNOSIS — M25662 Stiffness of left knee, not elsewhere classified: Secondary | ICD-10-CM | POA: Diagnosis not present

## 2021-06-29 DIAGNOSIS — Z7409 Other reduced mobility: Secondary | ICD-10-CM | POA: Diagnosis not present

## 2021-06-29 DIAGNOSIS — J4540 Moderate persistent asthma, uncomplicated: Secondary | ICD-10-CM | POA: Diagnosis not present

## 2021-06-29 DIAGNOSIS — R29898 Other symptoms and signs involving the musculoskeletal system: Secondary | ICD-10-CM | POA: Diagnosis not present

## 2021-06-29 DIAGNOSIS — M25462 Effusion, left knee: Secondary | ICD-10-CM | POA: Diagnosis not present

## 2021-07-03 DIAGNOSIS — Z96652 Presence of left artificial knee joint: Secondary | ICD-10-CM | POA: Diagnosis not present

## 2021-07-03 DIAGNOSIS — M25462 Effusion, left knee: Secondary | ICD-10-CM | POA: Diagnosis not present

## 2021-07-03 DIAGNOSIS — Z7409 Other reduced mobility: Secondary | ICD-10-CM | POA: Diagnosis not present

## 2021-07-03 DIAGNOSIS — M25562 Pain in left knee: Secondary | ICD-10-CM | POA: Diagnosis not present

## 2021-07-03 DIAGNOSIS — M25662 Stiffness of left knee, not elsewhere classified: Secondary | ICD-10-CM | POA: Diagnosis not present

## 2021-07-03 DIAGNOSIS — R29898 Other symptoms and signs involving the musculoskeletal system: Secondary | ICD-10-CM | POA: Diagnosis not present

## 2021-07-05 DIAGNOSIS — Z96652 Presence of left artificial knee joint: Secondary | ICD-10-CM | POA: Diagnosis not present

## 2021-07-05 DIAGNOSIS — R29898 Other symptoms and signs involving the musculoskeletal system: Secondary | ICD-10-CM | POA: Diagnosis not present

## 2021-07-05 DIAGNOSIS — Z7409 Other reduced mobility: Secondary | ICD-10-CM | POA: Diagnosis not present

## 2021-07-05 DIAGNOSIS — M25662 Stiffness of left knee, not elsewhere classified: Secondary | ICD-10-CM | POA: Diagnosis not present

## 2021-07-05 DIAGNOSIS — M25462 Effusion, left knee: Secondary | ICD-10-CM | POA: Diagnosis not present

## 2021-07-05 DIAGNOSIS — M25562 Pain in left knee: Secondary | ICD-10-CM | POA: Diagnosis not present

## 2021-07-10 DIAGNOSIS — Z7409 Other reduced mobility: Secondary | ICD-10-CM | POA: Diagnosis not present

## 2021-07-10 DIAGNOSIS — Z96652 Presence of left artificial knee joint: Secondary | ICD-10-CM | POA: Diagnosis not present

## 2021-07-10 DIAGNOSIS — R29898 Other symptoms and signs involving the musculoskeletal system: Secondary | ICD-10-CM | POA: Diagnosis not present

## 2021-07-10 DIAGNOSIS — M25462 Effusion, left knee: Secondary | ICD-10-CM | POA: Diagnosis not present

## 2021-07-10 DIAGNOSIS — M25562 Pain in left knee: Secondary | ICD-10-CM | POA: Diagnosis not present

## 2021-07-10 DIAGNOSIS — M25662 Stiffness of left knee, not elsewhere classified: Secondary | ICD-10-CM | POA: Diagnosis not present

## 2021-07-13 DIAGNOSIS — R29898 Other symptoms and signs involving the musculoskeletal system: Secondary | ICD-10-CM | POA: Diagnosis not present

## 2021-07-13 DIAGNOSIS — J4540 Moderate persistent asthma, uncomplicated: Secondary | ICD-10-CM | POA: Diagnosis not present

## 2021-07-13 DIAGNOSIS — M25562 Pain in left knee: Secondary | ICD-10-CM | POA: Diagnosis not present

## 2021-07-13 DIAGNOSIS — Z7409 Other reduced mobility: Secondary | ICD-10-CM | POA: Diagnosis not present

## 2021-07-13 DIAGNOSIS — M25662 Stiffness of left knee, not elsewhere classified: Secondary | ICD-10-CM | POA: Diagnosis not present

## 2021-07-13 DIAGNOSIS — M25462 Effusion, left knee: Secondary | ICD-10-CM | POA: Diagnosis not present

## 2021-07-13 DIAGNOSIS — Z96652 Presence of left artificial knee joint: Secondary | ICD-10-CM | POA: Diagnosis not present

## 2021-07-20 ENCOUNTER — Other Ambulatory Visit: Payer: Self-pay | Admitting: Orthopedic Surgery

## 2021-07-20 DIAGNOSIS — Z96652 Presence of left artificial knee joint: Secondary | ICD-10-CM

## 2021-07-21 DIAGNOSIS — R29898 Other symptoms and signs involving the musculoskeletal system: Secondary | ICD-10-CM | POA: Diagnosis not present

## 2021-07-21 DIAGNOSIS — M25562 Pain in left knee: Secondary | ICD-10-CM | POA: Diagnosis not present

## 2021-07-21 DIAGNOSIS — Z7409 Other reduced mobility: Secondary | ICD-10-CM | POA: Diagnosis not present

## 2021-07-21 DIAGNOSIS — M25662 Stiffness of left knee, not elsewhere classified: Secondary | ICD-10-CM | POA: Diagnosis not present

## 2021-07-21 DIAGNOSIS — M25462 Effusion, left knee: Secondary | ICD-10-CM | POA: Diagnosis not present

## 2021-07-21 DIAGNOSIS — Z96652 Presence of left artificial knee joint: Secondary | ICD-10-CM | POA: Diagnosis not present

## 2021-07-25 ENCOUNTER — Ambulatory Visit
Admission: RE | Admit: 2021-07-25 | Discharge: 2021-07-25 | Disposition: A | Discharge status: Home / Self Care | Payer: Medicare PPO | Source: Ambulatory Visit | Attending: Orthopedic Surgery | Admitting: Orthopedic Surgery

## 2021-07-25 DIAGNOSIS — Z96652 Presence of left artificial knee joint: Secondary | ICD-10-CM | POA: Diagnosis not present

## 2021-07-25 DIAGNOSIS — M79605 Pain in left leg: Secondary | ICD-10-CM | POA: Diagnosis not present

## 2021-07-25 DIAGNOSIS — R600 Localized edema: Secondary | ICD-10-CM | POA: Diagnosis not present

## 2021-07-25 DIAGNOSIS — M25662 Stiffness of left knee, not elsewhere classified: Secondary | ICD-10-CM | POA: Diagnosis not present

## 2021-07-25 DIAGNOSIS — R29898 Other symptoms and signs involving the musculoskeletal system: Secondary | ICD-10-CM | POA: Diagnosis not present

## 2021-07-25 DIAGNOSIS — Z7409 Other reduced mobility: Secondary | ICD-10-CM | POA: Diagnosis not present

## 2021-07-28 DIAGNOSIS — J4540 Moderate persistent asthma, uncomplicated: Secondary | ICD-10-CM | POA: Diagnosis not present

## 2021-07-28 DIAGNOSIS — M25462 Effusion, left knee: Secondary | ICD-10-CM | POA: Diagnosis not present

## 2021-07-28 DIAGNOSIS — M25562 Pain in left knee: Secondary | ICD-10-CM | POA: Diagnosis not present

## 2021-07-28 DIAGNOSIS — Z7409 Other reduced mobility: Secondary | ICD-10-CM | POA: Diagnosis not present

## 2021-07-28 DIAGNOSIS — Z96652 Presence of left artificial knee joint: Secondary | ICD-10-CM | POA: Diagnosis not present

## 2021-07-28 DIAGNOSIS — R29898 Other symptoms and signs involving the musculoskeletal system: Secondary | ICD-10-CM | POA: Diagnosis not present

## 2021-07-28 DIAGNOSIS — M25662 Stiffness of left knee, not elsewhere classified: Secondary | ICD-10-CM | POA: Diagnosis not present

## 2021-07-31 DIAGNOSIS — M25662 Stiffness of left knee, not elsewhere classified: Secondary | ICD-10-CM | POA: Diagnosis not present

## 2021-07-31 DIAGNOSIS — Z96652 Presence of left artificial knee joint: Secondary | ICD-10-CM | POA: Diagnosis not present

## 2021-07-31 DIAGNOSIS — R29898 Other symptoms and signs involving the musculoskeletal system: Secondary | ICD-10-CM | POA: Diagnosis not present

## 2021-07-31 DIAGNOSIS — M25562 Pain in left knee: Secondary | ICD-10-CM | POA: Diagnosis not present

## 2021-07-31 DIAGNOSIS — M25462 Effusion, left knee: Secondary | ICD-10-CM | POA: Diagnosis not present

## 2021-07-31 DIAGNOSIS — Z7409 Other reduced mobility: Secondary | ICD-10-CM | POA: Diagnosis not present

## 2021-08-01 ENCOUNTER — Ambulatory Visit: Payer: Medicare PPO | Admitting: Family

## 2021-08-01 VITALS — BP 126/64 | HR 73 | Temp 98.9°F | Resp 16 | Wt 184.0 lb

## 2021-08-01 DIAGNOSIS — R3915 Urgency of urination: Secondary | ICD-10-CM | POA: Diagnosis not present

## 2021-08-01 LAB — POC URINALSYSI DIPSTICK (AUTOMATED)
Bilirubin, UA: NEGATIVE
Glucose, UA: NEGATIVE
Ketones, UA: NEGATIVE
Leukocytes, UA: NEGATIVE
Nitrite, UA: NEGATIVE
Protein, UA: NEGATIVE
Spec Grav, UA: 1.01 (ref 1.010–1.025)
Urobilinogen, UA: 0.2 E.U./dL
pH, UA: 6 (ref 5.0–8.0)

## 2021-08-01 MED ORDER — CEFUROXIME AXETIL 500 MG PO TABS: 500.0000 mg | ORAL_TABLET | Freq: Two times a day (BID) | ORAL | 0 refills | Status: AC

## 2021-08-01 NOTE — Assessment & Plan Note (Signed)
Right sided flank pain/CVAT. Trace blood in urine. Will initiate ceftin to cover her for UTI and sinusitis.  If symptoms do not improve, recommend that she complete abdominal imaging to evaluate for kidney stone. She notes that she has had a kidney stone in the past and it felt different.

## 2021-08-01 NOTE — Progress Notes (Signed)
Subjective:   By signing my name below, I, Brandy Dunlap, attest that this documentation has been prepared under the direction and in the presence of [name] NP, 08/01/2021   Patient ID: Brandy Dunlap, female    DOB: 02-01-1951, 71 y.o.   MRN: [id number]  Chief Complaint  Patient presents with   Urinary Urgency    Patient complains of urgency to urinate and pressure while urinating     HPI Patient is in today for an office visit.  Knee Pain - She reports that she went to an orthopedic specialist for knee pain and was prescribed 5 Mg of Eliquis for possible blood clots in the knee. She states that she is still in pain. This is being managed by her orthopedist- Dr. Lorre Nick.  Kidney Infection -  She complains of a possible kidney infection. She states that she has back pain, urinary frequency, and warm urine. She also denies of dysuria.   She reports increased sinus pain/pressure for several days.    Health Maintenance Due  Topic Date Due   Hepatitis C Screening  Never done   Zoster Vaccines- Shingrix (1 of 2) Never done   COVID-19 Vaccine (4 - Booster for Pfizer series) 01/30/2020   TETANUS/TDAP  10/31/2020    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   Essential hypertension 10/08/2007   Qualifier: Diagnosis of  By: Arnoldo Morale MD, Balinda Quails    GERD (gastroesophageal reflux disease)    History of kidney stones    per 09-02-2018 pre-op eval ,  suspected kidney stone , to [facility] CT abd on 7-9- for further eval , passive   Hypertension    Infectious disease 05/23/2015   Low back pain    Osteoarthritis    Pneumonia    Pre-diabetes    Prediabetes 07/09/2016   Referred otalgia of left ear 03/26/2016   Sinus trouble     Past Surgical History:  Procedure Laterality Date   CHOLECYSTECTOMY     COLONOSCOPY     COLONOSCOPY W/ BIOPSIES  10/06/2003   LUMBAR FUSION  2008   LUMBAR FUSION     2020   PARTIAL HIP ARTHROPLASTY Right 2009    hip replacement   TONSILLECTOMY     TOTAL HIP ARTHROPLASTY Left 09/10/2018   Procedure: TOTAL HIP ARTHROPLASTY ANTERIOR APPROACH;  Surgeon: Gaynelle Arabian, MD;  Location: [facility];  Service: Orthopedics;  Laterality: Left;   TOTAL KNEE ARTHROPLASTY Right 11/16/2013   Procedure: RIGHT TOTAL KNEE ARTHROPLASTY;  Surgeon: Vickey Huger, MD;  Location: [place];  Service: Orthopedics;  Laterality: Right;   VIDEO BRONCHOSCOPY Bilateral 12/05/2012   Procedure: VIDEO BRONCHOSCOPY WITHOUT FLUORO;  Surgeon: Collene Gobble, MD;  [facility];  Service: Cardiopulmonary;  Laterality: Bilateral;    Family History  Problem Relation Age of Onset   COPD Mother    Heart disease  Mother    Ovarian cancer Mother    Emphysema Mother    Hypertension Mother    Hyperlipidemia Mother    Heart Problems Mother    Stroke Mother    Thyroid disease Mother    Cancer Mother    COPD Father        died at age 13   Emphysema Father    Heart Problems Father    Heart disease Brother        stent with 90%   Breast cancer Maternal Aunt    Colon cancer Neg Hx    Esophageal cancer Neg Hx    Rectal cancer Neg Hx    Stomach cancer Neg Hx    Colon polyps Neg Hx     Social History   Socioeconomic History   Marital status: Single    Spouse name: Not on file   Number of children: 1   Years of education: Not on file   Highest education level: Not on file  Occupational History   Occupation: Product manager:  [employer]  Tobacco Use   Smoking status: Never    Passive exposure: Never   Smokeless tobacco: Never  Vaping Use   Vaping Use: Never used  Substance and Sexual Activity   Alcohol use: No   Drug use: No   Sexual activity: Not Currently    Partners: Male    Birth control/protection: Post-menopausal  Other Topics Concern   Not on file  Social History Narrative   Divorced   One daughter- lives locally and one grandson   Retired Pharmacist, hospital,  Has masters degree   Enjoys reading, spending time with her grandson   Social Determinants of Radio broadcast assistant Strain: Low Risk    Difficulty of Paying Living Expenses: Not hard at all  Food Insecurity: No Food Insecurity   Worried About Charity fundraiser in the Last Year: Never true   Arboriculturist in the Last Year: Never true  Transportation Needs: No Transportation Needs   Lack of Transportation (Medical): No   Lack of Transportation (Non-Medical): No  Physical Activity: Insufficiently Active   Days of Exercise per Week: 3 days   Minutes of Exercise per Session: 40 min  Stress: No Stress Concern Present   Feeling of Stress : Not at all  Social Connections: Moderately Isolated   Frequency of Communication with Friends and Family: More than three times a week   Frequency of Social Gatherings with Friends and Family: More than three times a week   Attends Religious Services: More than 4 times per year   Active Member of [employer] or Organizations: No   Attends Archivist Meetings: Never   Marital Status: Never married  Human resources officer Violence: Not At Risk   Fear of Current or Ex-Partner: No   Emotionally Abused: No   Physically Abused: No   Sexually Abused: No    Outpatient Medications Prior to Visit  Medication Sig Dispense Refill   Albuterol Sulfate, sensor, (PROAIR DIGIHALER) 108 (90 Base) MCG/ACT AEPB Inhale into the lungs.     amLODipine (NORVASC) 5 MG tablet TAKE 1 TABLET (5 MG TOTAL) BY  MOUTH DAILY. 90 tablet 1   budesonide-formoterol (SYMBICORT) 160-4.5 MCG/ACT inhaler Inhale 2 puffs into the lungs 2 (two) times daily.     DEXILANT 60 MG capsule TAKE 1 CAPSULE BY MOUTH EVERY DAY 90 capsule 1   diclofenac sodium (VOLTAREN) 1 % GEL Apply  2 g topically 4 (four) times daily as needed. (Patient taking differently: Apply 2 g topically 4 (four) times daily as needed (L knee pain).) 100 g 3   ELIQUIS 5 MG TABS tablet Take 5 mg by mouth 2 (two) times daily.     EPINEPHrine 0.3 mg/0.3 mL IJ SOAJ injection Inject 0.3 mg into the muscle as needed for anaphylaxis.     meclizine (ANTIVERT) 12.5 MG tablet Take 1 tablet (12.5 mg total) by mouth 3 (three) times daily as needed for dizziness. 30 tablet 0   montelukast (SINGULAIR) 10 MG tablet Take 1 tablet (10 mg total) by mouth at bedtime. 30 tablet 3   omalizumab (XOLAIR) 150 MG injection Inject 150 mg into the skin every 14 (fourteen) days.     traMADol (ULTRAM) 50 MG tablet Take 50-100 mg by mouth every 6 (six) hours as needed for pain.     montelukast (SINGULAIR) 10 MG tablet Take 10 mg by mouth once.     No facility-administered medications prior to visit.    Allergies  Allergen Reactions   Augmentin [Amoxicillin-Pot Clavulanate] Diarrhea   Pantoprazole Sodium Palpitations    Review of Systems  Genitourinary:  Positive for frequency. Negative for dysuria.       (+) Abnormally warm urine  Musculoskeletal:  Positive for back pain.      Objective:    Physical Exam Constitutional:      General: She is not in acute distress.    Appearance: Normal appearance. She is not ill-appearing.  HENT:     Head: Normocephalic and atraumatic.     Right Ear: External ear normal.     Left Ear: External ear normal.  Eyes:     Extraocular Movements: Extraocular movements intact.     Pupils: Pupils are equal, round, and reactive to light.  Cardiovascular:     Rate and Rhythm: Normal rate and regular rhythm.     Heart sounds: Normal heart  sounds. No murmur heard.   No gallop.  Pulmonary:     Effort: Pulmonary effort is normal. No respiratory distress.     Breath sounds: Normal breath sounds. No wheezing or rales.  Musculoskeletal:     Comments: Right CVA tenderness  Skin:    General: Skin is warm and dry.  Neurological:     Mental Status: She is alert and oriented to person, place, and time.  Psychiatric:        Mood and Affect: Mood normal.        Behavior: Behavior normal.        Judgment: Judgment normal.    BP 126/64 (BP Location: Right Arm, Patient Position: Sitting, Cuff Size: Large)   Pulse 73   Temp 98.9 F (37.2 C) (Oral)   Resp 16   Wt 184 lb (83.5 kg)   SpO2 96%   BMI 32.59 kg/m  Wt Readings from Last 3 Encounters:  08/01/21 184 lb (83.5 kg)  06/08/21 190 lb 12.8 oz (86.5 kg)  05/08/21 188 lb (85.3 kg)       Assessment & Plan:   Problem List Items Addressed This Visit       Unprioritized   Urinary urgency - Primary    Right sided flank pain/CVAT. Trace blood in urine. Will initiate ceftin to cover her for UTI and sinusitis.  If symptoms do not improve, recommend that she complete abdominal imaging to evaluate for kidney stone. She notes that she has had a kidney stone in the past and it  felt different.        Relevant Orders   POCT Urinalysis Dipstick (Automated) (Completed)   Urine Culture    Meds ordered this encounter  Medications   cefUROXime (CEFTIN) 500 MG tablet    Sig: Take 1 tablet (500 mg total) by mouth 2 (two) times daily with a meal for 10 days.    Dispense:  20 tablet    Refill:  0    Order Specific Question:   Supervising Provider    Answer:   Penni Homans A [[id number]]    I, Nance Pear, NP, personally preformed the services described in this documentation.  All medical record entries made by the scribe were at my direction and in my presence.  I have reviewed the chart and discharge instructions (if applicable) and agree that the record reflects my personal  performance and is accurate and complete. 08/01/2021   I,Amber Collins,acting as a scribe for Nance Pear, NP.,have documented all relevant documentation on the behalf of Nance Pear, NP,as directed by  Nance Pear, NP while in the presence of Nance Pear, NP.  Nance Pear, NP

## 2021-08-02 DIAGNOSIS — M25662 Stiffness of left knee, not elsewhere classified: Secondary | ICD-10-CM | POA: Diagnosis not present

## 2021-08-02 DIAGNOSIS — Z7409 Other reduced mobility: Secondary | ICD-10-CM | POA: Diagnosis not present

## 2021-08-02 DIAGNOSIS — M25462 Effusion, left knee: Secondary | ICD-10-CM | POA: Diagnosis not present

## 2021-08-02 DIAGNOSIS — Z96652 Presence of left artificial knee joint: Secondary | ICD-10-CM | POA: Diagnosis not present

## 2021-08-02 DIAGNOSIS — R29898 Other symptoms and signs involving the musculoskeletal system: Secondary | ICD-10-CM | POA: Diagnosis not present

## 2021-08-02 DIAGNOSIS — M25562 Pain in left knee: Secondary | ICD-10-CM | POA: Diagnosis not present

## 2021-08-02 LAB — URINE CULTURE
MICRO NUMBER:: 13489231
SPECIMEN QUALITY:: ADEQUATE

## 2021-08-03 ENCOUNTER — Telehealth: Payer: Self-pay | Admitting: Family

## 2021-08-03 DIAGNOSIS — R3129 Other microscopic hematuria: Secondary | ICD-10-CM

## 2021-08-03 NOTE — Telephone Encounter (Signed)
See mychart.  

## 2021-08-04 NOTE — Addendum Note (Signed)
Addended by: [name] on: 08/04/2021 08:09 AM   Modules accepted: Orders

## 2021-08-08 DIAGNOSIS — M25662 Stiffness of left knee, not elsewhere classified: Secondary | ICD-10-CM | POA: Diagnosis not present

## 2021-08-08 DIAGNOSIS — Z7409 Other reduced mobility: Secondary | ICD-10-CM | POA: Diagnosis not present

## 2021-08-08 DIAGNOSIS — M25562 Pain in left knee: Secondary | ICD-10-CM | POA: Diagnosis not present

## 2021-08-08 DIAGNOSIS — M25462 Effusion, left knee: Secondary | ICD-10-CM | POA: Diagnosis not present

## 2021-08-08 DIAGNOSIS — R29898 Other symptoms and signs involving the musculoskeletal system: Secondary | ICD-10-CM | POA: Diagnosis not present

## 2021-08-08 DIAGNOSIS — Z96652 Presence of left artificial knee joint: Secondary | ICD-10-CM | POA: Diagnosis not present

## 2021-08-10 DIAGNOSIS — M25462 Effusion, left knee: Secondary | ICD-10-CM | POA: Diagnosis not present

## 2021-08-10 DIAGNOSIS — Z7409 Other reduced mobility: Secondary | ICD-10-CM | POA: Diagnosis not present

## 2021-08-10 DIAGNOSIS — M25562 Pain in left knee: Secondary | ICD-10-CM | POA: Diagnosis not present

## 2021-08-10 DIAGNOSIS — Z96652 Presence of left artificial knee joint: Secondary | ICD-10-CM | POA: Diagnosis not present

## 2021-08-10 DIAGNOSIS — M25662 Stiffness of left knee, not elsewhere classified: Secondary | ICD-10-CM | POA: Diagnosis not present

## 2021-08-10 DIAGNOSIS — R29898 Other symptoms and signs involving the musculoskeletal system: Secondary | ICD-10-CM | POA: Diagnosis not present

## 2021-08-10 DIAGNOSIS — J4540 Moderate persistent asthma, uncomplicated: Secondary | ICD-10-CM | POA: Diagnosis not present

## 2021-08-14 ENCOUNTER — Other Ambulatory Visit: Payer: Self-pay | Admitting: Family

## 2021-08-15 DIAGNOSIS — M25662 Stiffness of left knee, not elsewhere classified: Secondary | ICD-10-CM | POA: Diagnosis not present

## 2021-08-15 DIAGNOSIS — Z96652 Presence of left artificial knee joint: Secondary | ICD-10-CM | POA: Diagnosis not present

## 2021-08-15 DIAGNOSIS — R29898 Other symptoms and signs involving the musculoskeletal system: Secondary | ICD-10-CM | POA: Diagnosis not present

## 2021-08-15 DIAGNOSIS — M25462 Effusion, left knee: Secondary | ICD-10-CM | POA: Diagnosis not present

## 2021-08-15 DIAGNOSIS — M25562 Pain in left knee: Secondary | ICD-10-CM | POA: Diagnosis not present

## 2021-08-15 DIAGNOSIS — Z7409 Other reduced mobility: Secondary | ICD-10-CM | POA: Diagnosis not present

## 2021-08-25 DIAGNOSIS — Z96652 Presence of left artificial knee joint: Secondary | ICD-10-CM | POA: Diagnosis not present

## 2021-08-25 DIAGNOSIS — J4540 Moderate persistent asthma, uncomplicated: Secondary | ICD-10-CM | POA: Diagnosis not present

## 2021-08-25 DIAGNOSIS — M25662 Stiffness of left knee, not elsewhere classified: Secondary | ICD-10-CM | POA: Diagnosis not present

## 2021-08-25 DIAGNOSIS — Z7409 Other reduced mobility: Secondary | ICD-10-CM | POA: Diagnosis not present

## 2021-08-25 DIAGNOSIS — M25562 Pain in left knee: Secondary | ICD-10-CM | POA: Diagnosis not present

## 2021-08-25 DIAGNOSIS — M25462 Effusion, left knee: Secondary | ICD-10-CM | POA: Diagnosis not present

## 2021-08-25 DIAGNOSIS — R29898 Other symptoms and signs involving the musculoskeletal system: Secondary | ICD-10-CM | POA: Diagnosis not present

## 2021-08-28 ENCOUNTER — Other Ambulatory Visit: Payer: Self-pay | Admitting: Orthopedic Surgery

## 2021-08-28 DIAGNOSIS — M79605 Pain in left leg: Secondary | ICD-10-CM

## 2021-08-28 DIAGNOSIS — Z96652 Presence of left artificial knee joint: Secondary | ICD-10-CM

## 2021-08-30 DIAGNOSIS — M25462 Effusion, left knee: Secondary | ICD-10-CM | POA: Diagnosis not present

## 2021-08-30 DIAGNOSIS — M25662 Stiffness of left knee, not elsewhere classified: Secondary | ICD-10-CM | POA: Diagnosis not present

## 2021-08-30 DIAGNOSIS — M25562 Pain in left knee: Secondary | ICD-10-CM | POA: Diagnosis not present

## 2021-08-30 DIAGNOSIS — R29898 Other symptoms and signs involving the musculoskeletal system: Secondary | ICD-10-CM | POA: Diagnosis not present

## 2021-08-30 DIAGNOSIS — Z7409 Other reduced mobility: Secondary | ICD-10-CM | POA: Diagnosis not present

## 2021-08-30 DIAGNOSIS — Z96652 Presence of left artificial knee joint: Secondary | ICD-10-CM | POA: Diagnosis not present

## 2021-09-01 ENCOUNTER — Other Ambulatory Visit: Payer: Medicare PPO

## 2021-09-06 DIAGNOSIS — M25462 Effusion, left knee: Secondary | ICD-10-CM | POA: Diagnosis not present

## 2021-09-06 DIAGNOSIS — Z96652 Presence of left artificial knee joint: Secondary | ICD-10-CM | POA: Diagnosis not present

## 2021-09-06 DIAGNOSIS — M25662 Stiffness of left knee, not elsewhere classified: Secondary | ICD-10-CM | POA: Diagnosis not present

## 2021-09-06 DIAGNOSIS — M25562 Pain in left knee: Secondary | ICD-10-CM | POA: Diagnosis not present

## 2021-09-06 DIAGNOSIS — Z7409 Other reduced mobility: Secondary | ICD-10-CM | POA: Diagnosis not present

## 2021-09-06 DIAGNOSIS — R29898 Other symptoms and signs involving the musculoskeletal system: Secondary | ICD-10-CM | POA: Diagnosis not present

## 2021-09-07 DIAGNOSIS — J4540 Moderate persistent asthma, uncomplicated: Secondary | ICD-10-CM | POA: Diagnosis not present

## 2021-09-11 ENCOUNTER — Ambulatory Visit
Admission: RE | Admit: 2021-09-11 | Discharge: 2021-09-11 | Disposition: A | Discharge status: Home / Self Care | Payer: Medicare PPO | Source: Ambulatory Visit | Attending: Orthopedic Surgery | Admitting: Orthopedic Surgery

## 2021-09-11 DIAGNOSIS — I82532 Chronic embolism and thrombosis of left popliteal vein: Secondary | ICD-10-CM | POA: Diagnosis not present

## 2021-09-11 DIAGNOSIS — M79605 Pain in left leg: Secondary | ICD-10-CM

## 2021-09-11 DIAGNOSIS — Z96642 Presence of left artificial hip joint: Secondary | ICD-10-CM | POA: Diagnosis not present

## 2021-09-11 DIAGNOSIS — T8484XA Pain due to internal orthopedic prosthetic devices, implants and grafts, initial encounter: Secondary | ICD-10-CM

## 2021-09-12 DIAGNOSIS — M25662 Stiffness of left knee, not elsewhere classified: Secondary | ICD-10-CM | POA: Diagnosis not present

## 2021-09-12 DIAGNOSIS — M25562 Pain in left knee: Secondary | ICD-10-CM | POA: Diagnosis not present

## 2021-09-12 DIAGNOSIS — Z96652 Presence of left artificial knee joint: Secondary | ICD-10-CM | POA: Diagnosis not present

## 2021-09-12 DIAGNOSIS — R29898 Other symptoms and signs involving the musculoskeletal system: Secondary | ICD-10-CM | POA: Diagnosis not present

## 2021-09-12 DIAGNOSIS — M25462 Effusion, left knee: Secondary | ICD-10-CM | POA: Diagnosis not present

## 2021-09-12 DIAGNOSIS — Z7409 Other reduced mobility: Secondary | ICD-10-CM | POA: Diagnosis not present

## 2021-09-14 ENCOUNTER — Other Ambulatory Visit (INDEPENDENT_AMBULATORY_CARE_PROVIDER_SITE_OTHER): Payer: Medicare PPO

## 2021-09-14 DIAGNOSIS — R3129 Other microscopic hematuria: Secondary | ICD-10-CM

## 2021-09-14 LAB — URINALYSIS, ROUTINE W REFLEX MICROSCOPIC
Bilirubin Urine: NEGATIVE
Ketones, ur: NEGATIVE
Leukocytes,Ua: NEGATIVE
Nitrite: NEGATIVE
Specific Gravity, Urine: 1.015 (ref 1.000–1.030)
Total Protein, Urine: NEGATIVE
Urine Glucose: NEGATIVE
Urobilinogen, UA: 0.2 (ref 0.0–1.0)
pH: 7 (ref 5.0–8.0)

## 2021-09-18 ENCOUNTER — Encounter (INDEPENDENT_AMBULATORY_CARE_PROVIDER_SITE_OTHER): Payer: Medicare PPO | Admitting: Family

## 2021-09-18 DIAGNOSIS — I82439 Acute embolism and thrombosis of unspecified popliteal vein: Secondary | ICD-10-CM | POA: Diagnosis not present

## 2021-09-18 NOTE — Telephone Encounter (Signed)
Please see the MyChart message reply(ies) for my assessment and plan.  The patient gave consent for this Medical Advice Message and is aware that it may result in a bill to their insurance company as well as the possibility that this may result in a co-payment or deductible. They are an established patient, but are not seeking medical advice exclusively about a problem treated during an in person or video visit in the last 7 days. I did not recommend an in person or video visit within 7 days of my reply.  I spent a total of 8  minutes cumulative time within 7 days through [place], NP

## 2021-09-18 NOTE — Addendum Note (Signed)
Addended by: [name] on: 09/18/2021 04:38 PM   Modules accepted: Orders

## 2021-09-19 DIAGNOSIS — Z96652 Presence of left artificial knee joint: Secondary | ICD-10-CM | POA: Diagnosis not present

## 2021-09-19 DIAGNOSIS — R29898 Other symptoms and signs involving the musculoskeletal system: Secondary | ICD-10-CM | POA: Diagnosis not present

## 2021-09-19 DIAGNOSIS — Z7409 Other reduced mobility: Secondary | ICD-10-CM | POA: Diagnosis not present

## 2021-09-19 DIAGNOSIS — M25562 Pain in left knee: Secondary | ICD-10-CM | POA: Diagnosis not present

## 2021-09-19 DIAGNOSIS — M25662 Stiffness of left knee, not elsewhere classified: Secondary | ICD-10-CM | POA: Diagnosis not present

## 2021-09-19 DIAGNOSIS — M25462 Effusion, left knee: Secondary | ICD-10-CM | POA: Diagnosis not present

## 2021-09-21 DIAGNOSIS — J4540 Moderate persistent asthma, uncomplicated: Secondary | ICD-10-CM | POA: Diagnosis not present

## 2021-09-25 DIAGNOSIS — M25662 Stiffness of left knee, not elsewhere classified: Secondary | ICD-10-CM | POA: Diagnosis not present

## 2021-09-25 DIAGNOSIS — R29898 Other symptoms and signs involving the musculoskeletal system: Secondary | ICD-10-CM | POA: Diagnosis not present

## 2021-09-25 DIAGNOSIS — M25462 Effusion, left knee: Secondary | ICD-10-CM | POA: Diagnosis not present

## 2021-09-25 DIAGNOSIS — M25562 Pain in left knee: Secondary | ICD-10-CM | POA: Diagnosis not present

## 2021-09-25 DIAGNOSIS — Z96652 Presence of left artificial knee joint: Secondary | ICD-10-CM | POA: Diagnosis not present

## 2021-09-25 DIAGNOSIS — Z7409 Other reduced mobility: Secondary | ICD-10-CM | POA: Diagnosis not present

## 2021-09-26 DIAGNOSIS — M48061 Spinal stenosis, lumbar region without neurogenic claudication: Secondary | ICD-10-CM | POA: Diagnosis not present

## 2021-09-26 DIAGNOSIS — Z981 Arthrodesis status: Secondary | ICD-10-CM | POA: Diagnosis not present

## 2021-09-26 DIAGNOSIS — Z6832 Body mass index (BMI) 32.0-32.9, adult: Secondary | ICD-10-CM | POA: Diagnosis not present

## 2021-10-01 ENCOUNTER — Other Ambulatory Visit: Payer: Self-pay | Admitting: Family

## 2021-10-09 NOTE — Progress Notes (Unsigned)
[facility]  ASSESSMENT / PLAN: 71 y.o. 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 Patient has completed a course of anticoagulation for  uncomplicated, provoked deep venous thrombosis, and does not need further anticoagulation.  I counseled her about the benign nature of her ultrasound findings.  She should compress and elevate the lower extremity.  She should exercise as much as she feels comfortable doing.  She can follow-up with me as needed.    CHIEF COMPLAINT: Bruising and discomfort behind the knee, chronic deep vein thrombosis after knee replacement  HISTORY OF PRESENT ILLNESS: Brandy Dunlap is a 71 y.o. female who underwent knee replacement surgery with Dr. Ronnie Derby several months ago.  After surgery, the patient noticed discomfort and bruising behind the knee tracking down the calf.  Duplex ultrasound performed in late May showed nonocclusive left popliteal vein thrombosis.  She was started on anticoagulation therapy.  A follow-up duplex was performed in mid July which showed chronic deep venous thrombosis in the left popliteal vein.  The patient has had pain behind the knee and discomfort mostly with swelling in the calf and foot since that time.  She has tried compression, but typical compression socks are too uncomfortable for her to wear.  She has been using Ace wraps for compression.  She has not really elevated her leg yet.  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   Essential hypertension 10/08/2007   Qualifier: Diagnosis of  By: Arnoldo Morale MD, Balinda Quails    GERD (gastroesophageal reflux disease)    History of kidney stones    per 09-02-2018 pre-op eval , suspected kidney stone , to [facility] CT abd on 7-9- for further eval , passive   Hypertension    Infectious disease 05/23/2015   Low back pain    Osteoarthritis    Pneumonia    Pre-diabetes    Prediabetes 07/09/2016   Referred otalgia of left ear 03/26/2016   Sinus trouble     Past Surgical History:  Procedure Laterality Date   CHOLECYSTECTOMY     COLONOSCOPY     COLONOSCOPY W/ BIOPSIES  10/06/2003   LUMBAR FUSION  2008   LUMBAR FUSION     2020   PARTIAL HIP ARTHROPLASTY Right 2009    hip replacement   TONSILLECTOMY     TOTAL HIP ARTHROPLASTY Left 09/10/2018   Procedure: TOTAL HIP ARTHROPLASTY ANTERIOR APPROACH;  Surgeon: Gaynelle Arabian, MD;  Location: [facility];  Service: Orthopedics;  Laterality: Left;   TOTAL KNEE ARTHROPLASTY Right 11/16/2013   Procedure: RIGHT TOTAL KNEE ARTHROPLASTY;  Surgeon: Vickey Huger, MD;  Location: [place];  Service: Orthopedics;  Laterality: Right;   VIDEO BRONCHOSCOPY Bilateral 12/05/2012   Procedure: VIDEO BRONCHOSCOPY WITHOUT FLUORO;  Surgeon: Collene Gobble, MD;  [facility];  Service: Cardiopulmonary;  Laterality: Bilateral;    Family  History  Problem Relation Age of Onset   COPD Mother    Heart disease Mother    Ovarian cancer Mother    Emphysema Mother    Hypertension Mother    Hyperlipidemia Mother    Heart Problems Mother    Stroke Mother    Thyroid disease Mother    Cancer Mother    COPD Father        died at age 73   Emphysema Father    Heart Problems Father    Heart disease Brother        stent with 90%   Breast cancer Maternal Aunt    Colon cancer Neg Hx    Esophageal cancer Neg Hx    Rectal cancer Neg Hx    Stomach cancer Neg Hx    Colon polyps Neg Hx      Social History   Socioeconomic History   Marital status: Single    Spouse name: Not on file   Number of children: 1   Years of education: Not on file   Highest education level: Not on file  Occupational History   Occupation: Product manager: [employer]  Tobacco Use   Smoking status: Never    Passive exposure: Never   Smokeless tobacco: Never  Vaping Use   Vaping Use: Never used  Substance and Sexual Activity   Alcohol use: No   Drug use: No   Sexual activity: Not Currently    Partners: Male    Birth control/protection: Post-menopausal  Other Topics Concern   Not on file  Social History Narrative   Divorced   One daughter- lives locally and one grandson   Retired Pharmacist, hospital,  Has masters degree   Enjoys reading, spending time with her grandson   Social Determinants of Health   Financial Resource Strain: Low Risk  (04/11/2021)   Overall Financial Resource Strain (CARDIA)    Difficulty of Paying Living Expenses: Not hard at all  Food Insecurity: No Food Insecurity (04/11/2021)   Hunger Vital Sign    Worried About Running Out of Food in the Last Year: Never true    [place] in the Last Year: Never true  Transportation Needs: No Transportation Needs (04/11/2021)   PRAPARE - Hydrologist (Medical): No    Lack of Transportation (Non-Medical): No  Physical Activity: Insufficiently Active (04/11/2021)   Exercise Vital Sign    Days of Exercise per Week: 3 days    Minutes of Exercise per Session: 40 min  Stress: No Stress Concern Present (04/11/2021)   [place]    Feeling of Stress : Not at all  Social Connections: Moderately Isolated (04/11/2021)   Social Connection and Isolation Panel [NHANES]    Frequency of Communication with Friends and Family: More than three times a week    Frequency of Social Gatherings with Friends and Family: More than three times  a week    Attends Religious Services: More than 4 times per year    Active Member of [employer] or Organizations: No    Attends Archivist Meetings: Never    Marital Status: Never married  Intimate Partner Violence: Not At Risk (04/11/2021)   Humiliation, Afraid, Rape, and Kick questionnaire    Fear of Current or Ex-Partner: No    Emotionally Abused: No    Physically Abused: No    Sexually Abused: No    Allergies  Allergen Reactions   Augmentin [Amoxicillin-Pot Clavulanate] Diarrhea   Pantoprazole Sodium Palpitations    Current Outpatient Medications  Medication Sig Dispense Refill   Albuterol Sulfate, sensor, (PROAIR DIGIHALER) 108 (90 Base) MCG/ACT AEPB Inhale into the lungs.     amLODipine (NORVASC) 5 MG tablet TAKE 1 TABLET (5 MG TOTAL) BY MOUTH DAILY. 90 tablet 1   budesonide-formoterol (SYMBICORT) 160-4.5 MCG/ACT inhaler Inhale 2 puffs into the lungs 2 (two) times daily.     DEXILANT 60 MG capsule TAKE 1 CAPSULE BY MOUTH EVERY DAY 90 capsule 1   diclofenac sodium (VOLTAREN) 1 % GEL Apply 2 g topically 4 (four) times daily as needed. (Patient taking differently: Apply 2 g topically 4 (four) times daily as needed (L knee pain).) 100 g 3   EPINEPHrine 0.3 mg/0.3 mL IJ SOAJ injection Inject 0.3 mg into the muscle as needed for anaphylaxis.     montelukast (SINGULAIR) 10 MG tablet Take 1 tablet (10 mg total) by mouth at bedtime. 30 tablet 3   omalizumab (XOLAIR) 150 MG injection Inject 150 mg into the skin every 14 (fourteen) days.     traMADol (ULTRAM) 50 MG tablet Take 50-100 mg by mouth every 6 (six) hours as needed for pain.     No current facility-administered medications for this visit.    PHYSICAL EXAM Vitals:   10/10/21 1024  BP: (!) 146/83  Pulse: 71  Resp: 20  Temp: 97.9 F (36.6 C)  SpO2: 93%  Weight: 82.1 kg  Height: '5\' 3"'$  (1.6 m)   Well-appearing elderly woman in no acute distress Regular rate and rhythm Unlabored breathing Left lower extremity total  knee arthroplasty incision healing well.  There is trace edema in the left lower extremity from the distal thigh to the foot.  Pedal pulses are easily palpable.  PERTINENT LABORATORY AND RADIOLOGIC DATA  Most recent CBC    Latest Ref Rng & Units 06/08/2021    1:38 PM 04/03/2021   12:20 PM 12/08/2020   12:33 PM  CBC  WBC 4.0 - 10.5 K/uL 8.0  7.7  7.9   Hemoglobin 12.0 - 15.0 g/dL 14.5  13.8  13.3   Hematocrit 36.0 - 46.0 % 43.2  42.8  39.6   Platelets 150 - 400 K/uL 275  274.0  255      Most recent CMP    Latest Ref Rng & Units 06/08/2021    1:38 PM 04/03/2021   12:20 PM 01/24/2021    9:39 AM  CMP  Glucose 70 - 99 mg/dL 118  104  110   BUN 8 - 23 mg/dL '10  10  11   '$ Creatinine 0.44 - 1.00 mg/dL 0.71  0.72  0.72   Sodium 135 - 145 mmol/L 138  138  138   Potassium 3.5 - 5.1 mmol/L 3.7  4.1  3.8   Chloride 98 - 111 mmol/L 104  101  105   CO2 22 - 32 mmol/L '25  30  24   '$ Calcium 8.9 - 10.3 mg/dL 9.5  9.7  9.6   Total Protein 6.5 - 8.1 g/dL 7.3  6.7  6.7   Total Bilirubin 0.3 - 1.2 mg/dL 0.6  0.6  0.7   Alkaline Phos 38 - 126 U/L 67  61  64   AST 15 - 41 U/L 44  34  43   ALT 0 - 44 U/L 44  39  48     Renal function CrCl cannot be calculated (Patient's most recent  lab result is older than the maximum 21 days allowed.).  Hgb A1c MFr Bld (%)  Date Value  01/24/2021 6.3    LDL Calculated  Date Value Ref Range Status  04/04/2017 116 (H) 0 - 99 mg/dL Final   LDL Cholesterol  Date Value Ref Range Status  01/24/2021 113 (H) 0 - 99 mg/dL Final   Direct LDL  Date Value Ref Range Status  09/11/2010 149.9 mg/dL Final    Comment:    Optimal:  <100 mg/dLNear or Above Optimal:  100-129 mg/dLBorderline High:  130-159 mg/dLHigh:  160-189 mg/dLVery High:  >190 mg/dL    Venous duplex of 07/25/2021 and 09/11/2021 personally reviewed.  Acute thrombus noted in initial scans in the popliteal vein.  Chronic thrombus noted on follow-up exam.  Both are nonocclusive.  Yevonne Aline. Stanford Breed,  MD [facility] Phone Number: [phone] 10/10/2021 12:01 PM  Total time spent on preparing this encounter including chart review, data review, collecting history, examining the patient, coordinating care for this new patient, 45 minutes.  Portions of this report may have been transcribed using voice recognition software.  Every effort has been made to ensure accuracy; however, inadvertent computerized transcription errors may still be present.

## 2021-10-10 ENCOUNTER — Ambulatory Visit: Payer: Medicare PPO | Admitting: Vascular Surgery

## 2021-10-10 ENCOUNTER — Encounter: Payer: Self-pay | Admitting: Vascular Surgery

## 2021-10-10 VITALS — BP 146/83 | HR 71 | Temp 97.9°F | Resp 20 | Ht 63.0 in | Wt 181.0 lb

## 2021-10-10 DIAGNOSIS — J4540 Moderate persistent asthma, uncomplicated: Secondary | ICD-10-CM | POA: Diagnosis not present

## 2021-10-10 DIAGNOSIS — I82532 Chronic embolism and thrombosis of left popliteal vein: Secondary | ICD-10-CM | POA: Diagnosis not present

## 2021-10-25 DIAGNOSIS — J4540 Moderate persistent asthma, uncomplicated: Secondary | ICD-10-CM | POA: Diagnosis not present

## 2021-10-31 ENCOUNTER — Ambulatory Visit: Payer: Medicare PPO | Attending: Cardiology | Admitting: Cardiology

## 2021-10-31 ENCOUNTER — Encounter: Payer: Self-pay | Admitting: Cardiology

## 2021-10-31 VITALS — BP 134/76 | HR 86 | Ht 63.0 in | Wt 181.0 lb

## 2021-10-31 DIAGNOSIS — I10 Essential (primary) hypertension: Secondary | ICD-10-CM

## 2021-10-31 DIAGNOSIS — I82432 Acute embolism and thrombosis of left popliteal vein: Secondary | ICD-10-CM

## 2021-10-31 DIAGNOSIS — R7303 Prediabetes: Secondary | ICD-10-CM | POA: Diagnosis not present

## 2021-10-31 DIAGNOSIS — E785 Hyperlipidemia, unspecified: Secondary | ICD-10-CM | POA: Diagnosis not present

## 2021-10-31 NOTE — Addendum Note (Signed)
Addended by: Jacobo Forest D on: 10/31/2021 10:48 AM   Modules accepted: Orders

## 2021-10-31 NOTE — Patient Instructions (Signed)
Medication Instructions:  Your physician recommends that you continue on your current medications as directed. Please refer to the Current Medication list given to you today.  *If you need a refill on your cardiac medications before your next appointment, please call your pharmacy*   Lab Work: Lipid Profile- Today 2nd [address] If you have labs (blood work) drawn today and your tests are completely normal, you will receive your results only by: [place] (if you have MyChart) OR A paper copy in the mail If you have any lab test that is abnormal or we need to change your treatment, we will call you to review the results.   Testing/Procedures: We will order CT coronary calcium score. It will cost $99.00 and is not covered by insurance.  Please call to schedule.     [facility] [address] [place], [place] [address] [phone]     Follow-Up: At [facility], you and your health needs are our priority.  As part of our continuing mission to provide you with exceptional heart care, we have created designated Provider Care Teams.  These Care Teams include your primary Cardiologist (physician) and Advanced Practice Providers (APPs -  Physician Assistants and Nurse Practitioners) who all work together to provide you with the care you need, when you need it.  We recommend signing up for the patient portal called "MyChart".  Sign up information is provided on this After Visit Summary.  MyChart is used to connect with patients for Virtual Visits (Telemedicine).  Patients are able to view lab/test results, encounter notes, upcoming appointments, etc.  Non-urgent messages can be sent to your provider as well.   To learn more about what you can do with MyChart, go to [email or web address].    Your next appointment:   1 year(s)  The format for your next appointment:   In Person  Provider:   Jenne Campus, MD    Other Instructions NA

## 2021-10-31 NOTE — Progress Notes (Signed)
Cardiology Office Note:    Date:  10/31/2021   ID:  Brandy Dunlap, DOB [birth year], MRN [id number]  PCP:  [name], NP  Cardiologist:  Jenne Campus, MD    Referring MD: [name], NP     History of Present Illness:    Brandy Dunlap is a 71 y.o. female with past medical history significant for asthma, iron deficiency anemia, essential hypertension, dyslipidemia, atypical chest pain.  She also got chronic arthritis in both knees 5 months ago she got left knee surgery done course was however complicated and she is unhappy with surgery.  She regrets she had abdominal some outpatient.  She end up having some popliteal vein thrombosis which gave her a lot of symptoms.  Cardiac wise seems to be doing well.  She denies have any chest pain tightness squeezing pressure burning chest no palpitation dizziness swelling of lower extremities  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   Essential hypertension 10/08/2007   Qualifier: Diagnosis of  By: Arnoldo Morale MD, Balinda Quails    GERD (gastroesophageal reflux disease)    History of kidney stones    per 09-02-2018 pre-op eval , suspected kidney stone , to [facility] CT abd on 7-9- for further eval , passive   Hypertension    Infectious disease 05/23/2015   Low back pain    Osteoarthritis    Pneumonia    Pre-diabetes    Prediabetes 07/09/2016   Referred otalgia of left ear 03/26/2016   Sinus trouble      Past Surgical History:  Procedure Laterality Date   CHOLECYSTECTOMY     COLONOSCOPY     COLONOSCOPY W/ BIOPSIES  10/06/2003   left knee replacement Left 05/2021   LUMBAR FUSION  2008   LUMBAR FUSION     2020   PARTIAL HIP ARTHROPLASTY Right 2009    hip replacement   TONSILLECTOMY     TOTAL HIP ARTHROPLASTY Left 09/10/2018   Procedure: TOTAL HIP ARTHROPLASTY ANTERIOR APPROACH;  Surgeon: Gaynelle Arabian, MD;  Location: [facility];  Service: Orthopedics;  Laterality: Left;   TOTAL KNEE ARTHROPLASTY Right 11/16/2013   Procedure: RIGHT TOTAL KNEE ARTHROPLASTY;  Surgeon: Vickey Huger, MD;  Location: [place];  Service: Orthopedics;  Laterality: Right;   VIDEO BRONCHOSCOPY Bilateral 12/05/2012   Procedure: VIDEO BRONCHOSCOPY WITHOUT FLUORO;  Surgeon: Collene Gobble, MD;  Location: [facility];  Service: Cardiopulmonary;  Laterality: Bilateral;    Current Medications: Current Meds  Medication Sig   Albuterol Sulfate, sensor, (PROAIR DIGIHALER) 108 (90 Base) MCG/ACT AEPB Inhale 1 puff into the lungs daily.   amLODipine (NORVASC) 5 MG tablet TAKE 1 TABLET (5 MG TOTAL) BY MOUTH DAILY.   budesonide-formoterol (SYMBICORT) 160-4.5 MCG/ACT inhaler Inhale 2 puffs into the lungs 2 (two) times daily.   DEXILANT 60 MG capsule TAKE 1 CAPSULE BY MOUTH EVERY DAY (Patient taking differently:  Take 60 mg by mouth daily.)   diclofenac sodium (VOLTAREN) 1 % GEL Apply 2 g topically 4 (four) times daily as needed. (Patient taking differently: Apply 2 g topically 4 (four) times daily as needed (L knee pain).)   EPINEPHrine 0.3 mg/0.3 mL IJ SOAJ injection Inject 0.3 mg into the muscle as needed for anaphylaxis.   montelukast (SINGULAIR) 10 MG tablet Take 1 tablet (10 mg total) by mouth at bedtime.   omalizumab (XOLAIR) 150 MG injection Inject 150 mg into the skin every 14 (fourteen) days.   traMADol (ULTRAM) 50 MG tablet Take 50-100 mg by mouth every 6 (six) hours as needed for pain.     Allergies:   Augmentin  [amoxicillin-pot clavulanate] and Pantoprazole sodium   Social History   Socioeconomic History   Marital status: Single    Spouse name: Not on file   Number of children: 1   Years of education: Not on file   Highest education level: Not on file  Occupational History   Occupation: Product manager: [employer]  Tobacco Use   Smoking status: Never    Passive exposure: Never   Smokeless tobacco: Never  Vaping Use   Vaping Use: Never used  Substance and Sexual Activity   Alcohol use: No   Drug use: No   Sexual activity: Not Currently    Partners: Male    Birth control/protection: Post-menopausal  Other Topics Concern   Not on file  Social History Narrative   Divorced   One daughter- lives locally and one grandson   Retired Pharmacist, hospital,  Has masters degree   Enjoys reading, spending time with her grandson   Social Determinants of Health   Financial Resource Strain: Low Risk  (04/11/2021)   Overall Financial Resource Strain (CARDIA)    Difficulty of Paying Living Expenses: Not hard at all  Food Insecurity: No Food Insecurity (04/11/2021)   Hunger Vital Sign    Worried About Running Out of Food in the Last Year: Never true    [place] in the Last Year: Never true  Transportation Needs: No Transportation Needs (04/11/2021)   PRAPARE - Hydrologist (Medical): No    Lack of Transportation (Non-Medical): No  Physical Activity: Insufficiently Active (04/11/2021)   Exercise Vital Sign    Days of Exercise per Week: 3 days    Minutes of Exercise per Session: 40 min  Stress: No Stress Concern Present (04/11/2021)   [place]    Feeling of Stress : Not at all  Social Connections: Moderately Isolated (04/11/2021)   Social Connection and Isolation Panel [NHANES]    Frequency of Communication with Friends and Family: More than three times a week    Frequency of Social  Gatherings with Friends and Family: More than three times a week    Attends Religious Services: More than 4 times per year    Active Member of [employer] or Organizations: No    Attends Music therapist: Never    Marital Status: Never married     Family History: The patient's family history includes Breast cancer in her maternal aunt; COPD in her father and mother; Cancer in her mother; Emphysema in her father and mother; Heart Problems in her father and mother; Heart disease in her brother and mother; Hyperlipidemia in her mother; Hypertension in her mother; Ovarian cancer in her mother; Stroke in her mother; Thyroid disease  in her mother. There is no history of Colon cancer, Esophageal cancer, Rectal cancer, Stomach cancer, or Colon polyps. ROS:   Please see the history of present illness.    All 14 point review of systems negative except as described per history of present illness  EKGs/Labs/Other Studies Reviewed:      Recent Labs: 06/08/2021: ALT 44; BUN 10; Creatinine 0.71; Hemoglobin 14.5; Platelet Count 275; Potassium 3.7; Sodium 138  Recent Lipid Panel    Component Value Date/Time   CHOL 192 01/24/2021 0939   CHOL 204 (H) 04/04/2017 0838   TRIG 151.0 (H) 01/24/2021 0939   HDL 48.10 01/24/2021 0939   HDL 62 04/04/2017 0838   CHOLHDL 4 01/24/2021 0939   VLDL 30.2 01/24/2021 0939   LDLCALC 113 (H) 01/24/2021 0939   LDLCALC 116 (H) 04/04/2017 0838   LDLDIRECT 149.9 09/11/2010 0908    Physical Exam:    VS:  BP 134/76   Pulse 86   Ht '5\' 3"'$  (1.6 m)   Wt 181 lb (82.1 kg)   SpO2 95%   BMI 32.06 kg/m     Wt Readings from Last 3 Encounters:  10/31/21 181 lb (82.1 kg)  10/10/21 181 lb (82.1 kg)  08/01/21 184 lb (83.5 kg)     GEN:  Well nourished, well developed in no acute distress HEENT: Normal NECK: No JVD; No carotid bruits LYMPHATICS: No lymphadenopathy CARDIAC: RRR, no murmurs, no rubs, no gallops RESPIRATORY:  Clear to auscultation without rales,  wheezing or rhonchi  ABDOMEN: Soft, non-tender, non-distended MUSCULOSKELETAL:  No edema; No deformity  SKIN: Warm and dry LOWER EXTREMITIES: no swelling NEUROLOGIC:  Alert and oriented x 3 PSYCHIATRIC:  Normal affect   ASSESSMENT:    1. Essential (primary) hypertension   2. Thrombosis of left popliteal vein (HCC)   3. Pre-diabetes   4. Dyslipidemia    PLAN:    In order of problems listed above:  Essential hypertension blood pressure well controlled we will continue present management. Dyslipidemia I did review K PN which show LDL of 113 HDL 48 this is from 01/24/2021.  I calculated her predicted 10 years risk which was 15.6% which is intermediate.  We initiated conversation about potentially taking statin which she is reluctant to do.  I think there is some value of doing a calcium score in her to help [name] determine need to aggressively treat her cholesterol.  Therefore will repeat her fasting lipid profile today, will do calcium score Prediabetes hemoglobin A1c 6.3 this is from January 24, 2021 data from K PN. Dyslipidemia plan as described above Popliteal vein thrombosis that being taken care of by [employer] [facility].   Medication Adjustments/Labs and Tests Ordered: Current medicines are reviewed at length with the patient today.  Concerns regarding medicines are outlined above.  No orders of the defined types were placed in this encounter.  Medication changes: No orders of the defined types were placed in this encounter.   Signed, Park Liter, MD, [facility] 10/31/2021 10:37 AM    [place]

## 2021-11-01 LAB — LIPID PANEL
Chol/HDL Ratio: 3.2 ratio (ref 0.0–4.4)
Cholesterol, Total: 187 mg/dL (ref 100–199)
HDL: 59 mg/dL (ref >39)
LDL Chol Calc (NIH): 107 mg/dL — ABNORMAL HIGH (ref 0–99)
Triglycerides: 116 mg/dL (ref 0–149)
VLDL Cholesterol Cal: 21 mg/dL (ref 5–40)

## 2021-11-08 ENCOUNTER — Ambulatory Visit: Payer: Medicare PPO | Admitting: Internal Medicine

## 2021-11-08 ENCOUNTER — Ambulatory Visit (HOSPITAL_BASED_OUTPATIENT_CLINIC_OR_DEPARTMENT_OTHER)
Admission: RE | Admit: 2021-11-08 | Discharge: 2021-11-08 | Disposition: A | Discharge status: Home / Self Care | Payer: Medicare PPO | Source: Ambulatory Visit | Attending: Cardiology | Admitting: Cardiology

## 2021-11-08 ENCOUNTER — Encounter: Payer: Self-pay | Admitting: Internal Medicine

## 2021-11-08 ENCOUNTER — Ambulatory Visit (HOSPITAL_BASED_OUTPATIENT_CLINIC_OR_DEPARTMENT_OTHER)
Admission: RE | Admit: 2021-11-08 | Discharge: 2021-11-08 | Disposition: A | Discharge status: Home / Self Care | Payer: Medicare PPO | Source: Ambulatory Visit | Attending: Internal Medicine | Admitting: Internal Medicine

## 2021-11-08 VITALS — BP 134/84 | HR 79 | Temp 98.4°F | Resp 16 | Ht 63.0 in | Wt 183.5 lb

## 2021-11-08 DIAGNOSIS — R3129 Other microscopic hematuria: Secondary | ICD-10-CM

## 2021-11-08 DIAGNOSIS — R399 Unspecified symptoms and signs involving the genitourinary system: Secondary | ICD-10-CM | POA: Diagnosis not present

## 2021-11-08 DIAGNOSIS — R109 Unspecified abdominal pain: Secondary | ICD-10-CM | POA: Insufficient documentation

## 2021-11-08 DIAGNOSIS — E785 Hyperlipidemia, unspecified: Secondary | ICD-10-CM | POA: Diagnosis not present

## 2021-11-08 LAB — POCT URINALYSIS DIPSTICK (MANUAL)
Leukocytes, UA: NEGATIVE
Nitrite, UA: NEGATIVE
Poct Bilirubin: NEGATIVE
Poct Glucose: NORMAL mg/dL
Poct Ketones: NEGATIVE
Poct Protein: NEGATIVE mg/dL
Poct Urobilinogen: NORMAL mg/dL
Spec Grav, UA: <=1.005 — AB (ref 1.010–1.025)
pH, UA: 6 (ref 5.0–8.0)

## 2021-11-08 IMAGING — RF DG SWALLOWING FUNCTION
12 of 15 series · 12 of 24 positions shown · non-contrast
Comparison: None.

CLINICAL DATA: Dysphagia. Chronic cough.

EXAM:
MODIFIED BARIUM SWALLOW
TECHNIQUE: Different consistencies of barium were administered orally to the
patient by the Speech Pathologist. Imaging of the pharynx was
performed in the lateral projection. The radiologist was present in
the fluoroscopy room for this study, providing personal supervision.
FLUOROSCOPY TIME:  Fluoroscopy Time:  50 seconds
Radiation Exposure Index (if provided by the fluoroscopic device):
14 mGy
Number of Acquired Spot Images: 0

[Series 1: run · 1 of 12 frames shown (1 of 12)]
[frame 11/12]
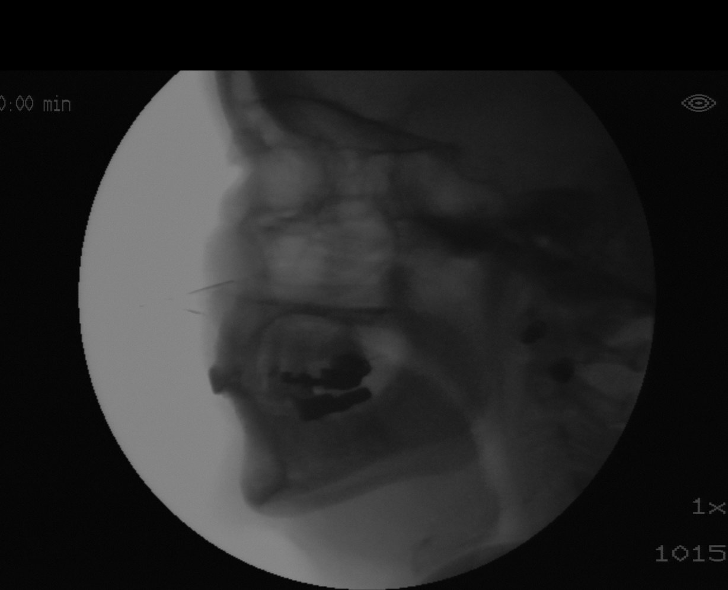

[Series 3: run · 1 of 65 frames shown (2 of 12)]
[frame 2/65]
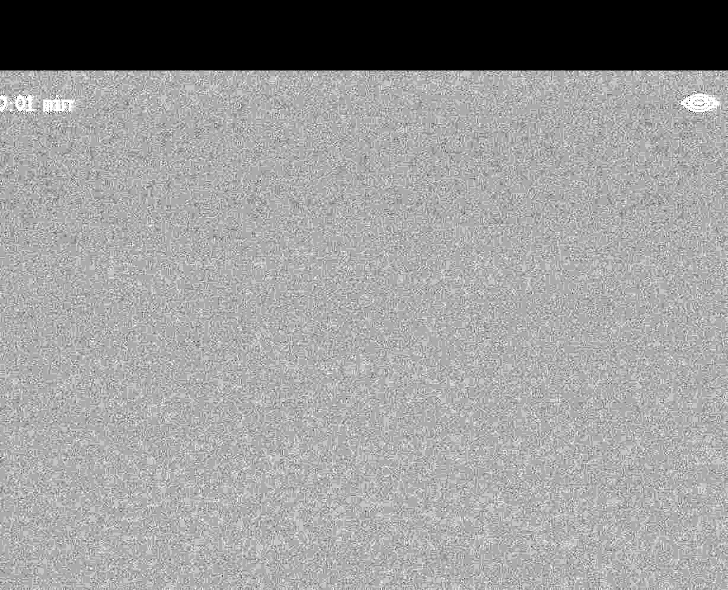

[Series 4: run · 1 of 101 frames shown (3 of 12)]
[frame 51/101]
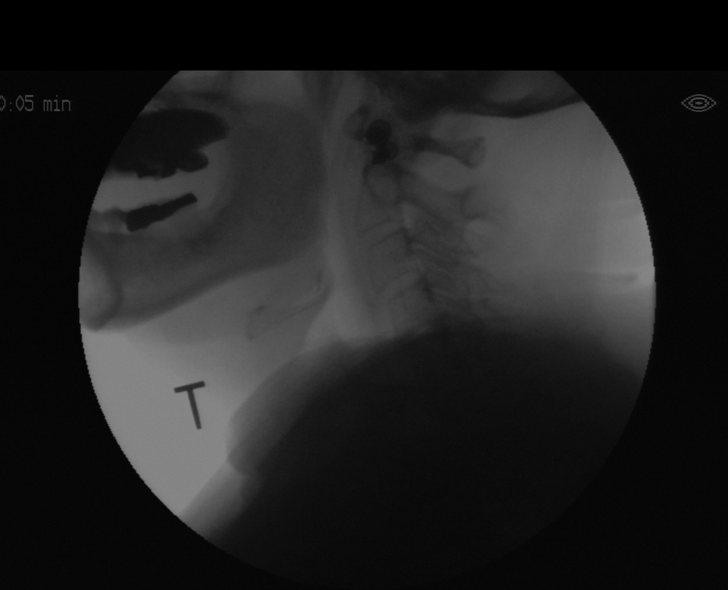

[Series 5: run · 1 of 81 frames shown (4 of 12)]
[frame 41/81]
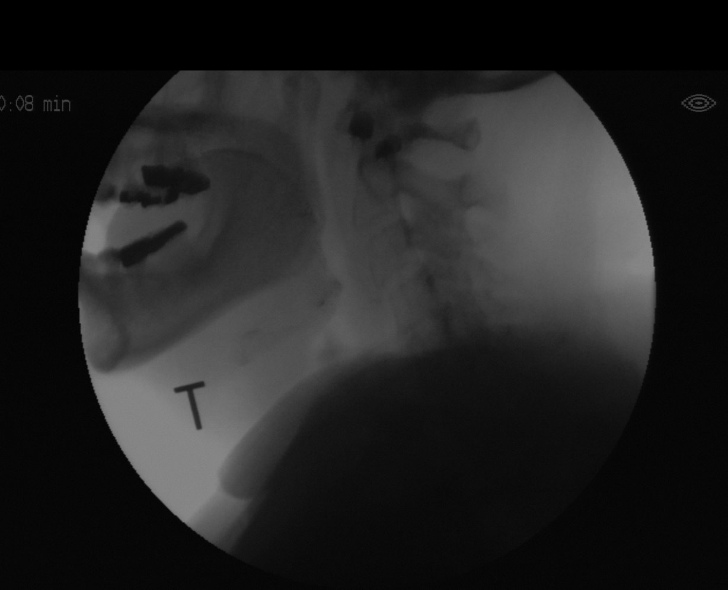

[Series 6: run · 1 of 90 frames shown (5 of 12)]
[frame 77/90]
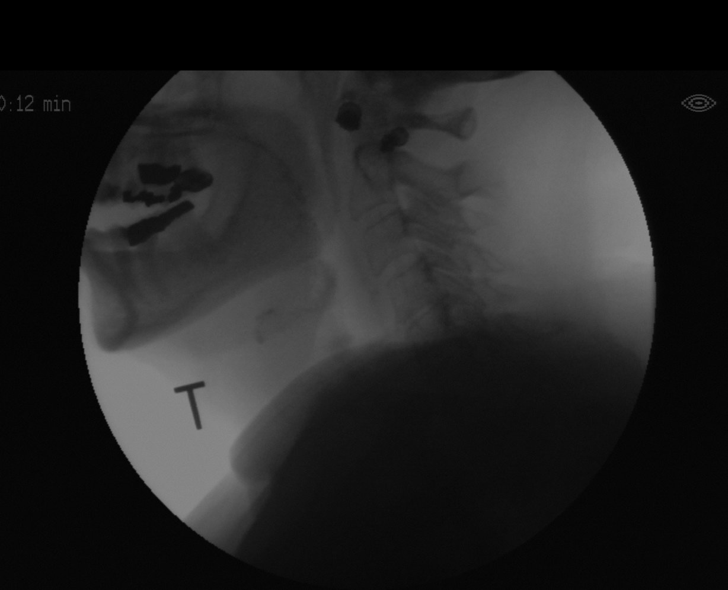

[Series 8: run · 1 of 81 frames shown (6 of 12)]
[frame 13/81]
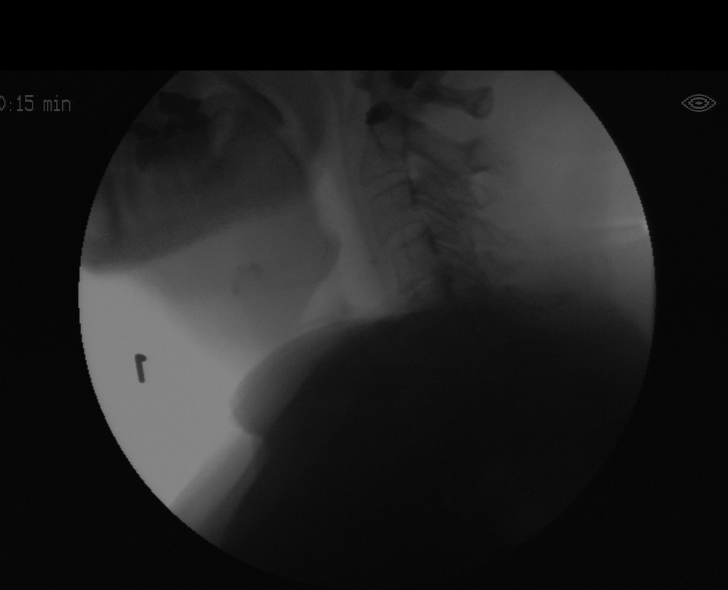

[Series 9: run · 1 of 96 frames shown (7 of 12)]
[frame 30/96]
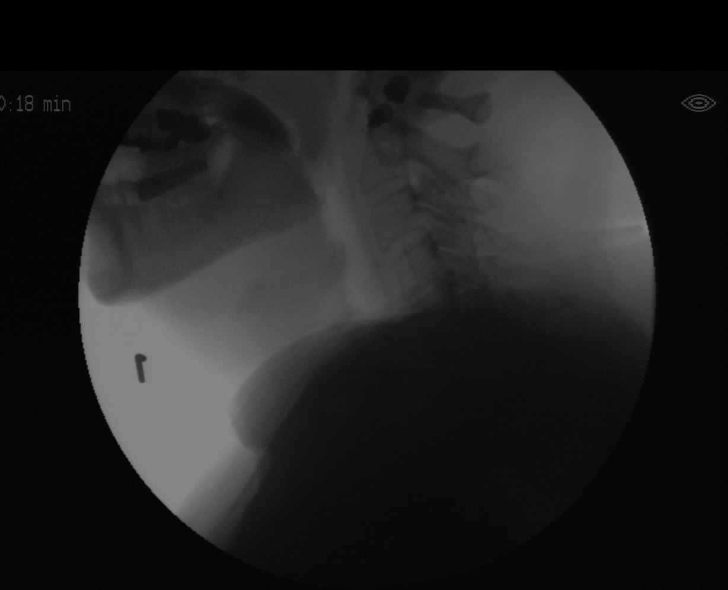

[Series 10: run · 1 of 72 frames shown (8 of 12)]
[frame 37/72]
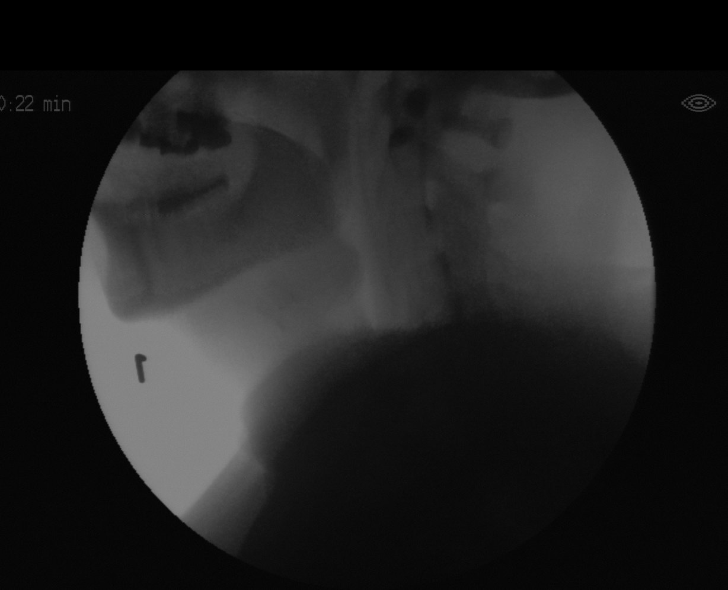

[Series 12: run · 1 of 203 frames shown (9 of 12)]
[frame 17/203]
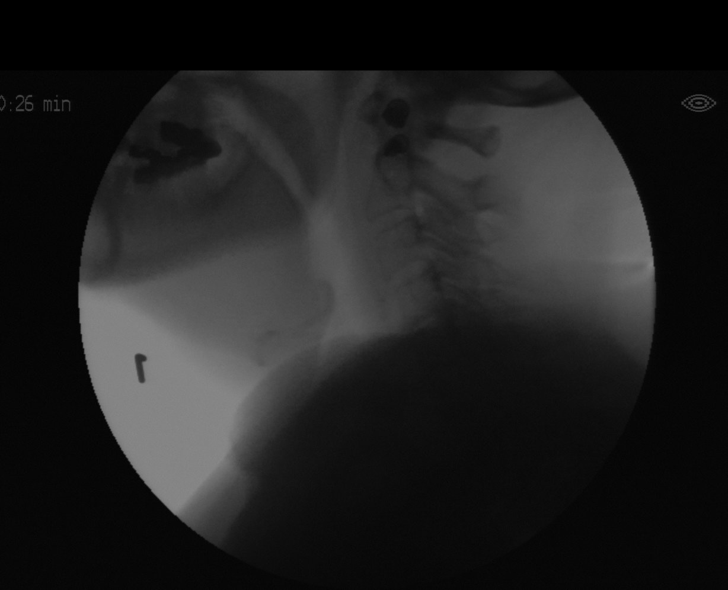

[Series 13: run · 1 of 145 frames shown (10 of 12)]
[frame 22/145]
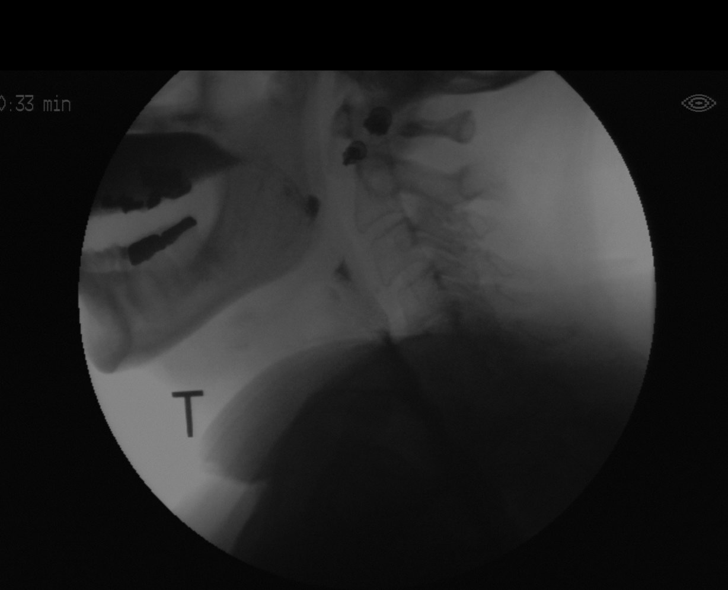

[Series 14: run · 1 of 324 frames shown (11 of 12)]
[frame 163/324]
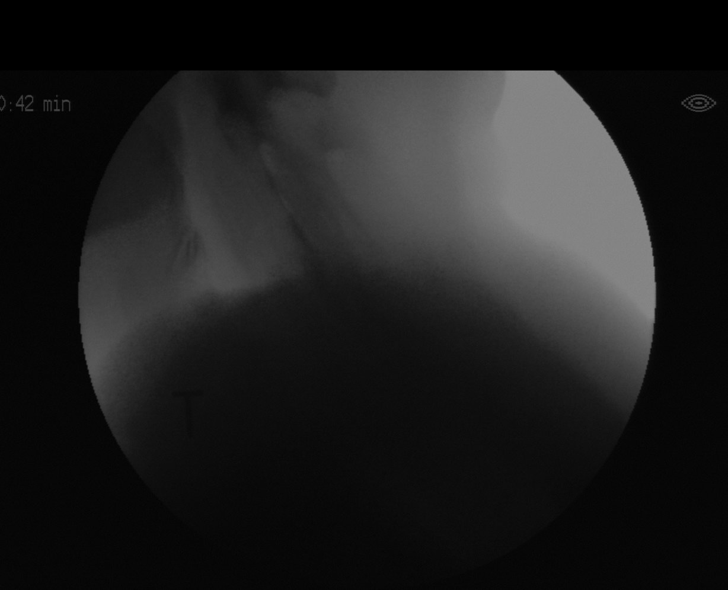

[Series 15: run · 1 of 68 frames shown (12 of 12)]
[frame 61/68]
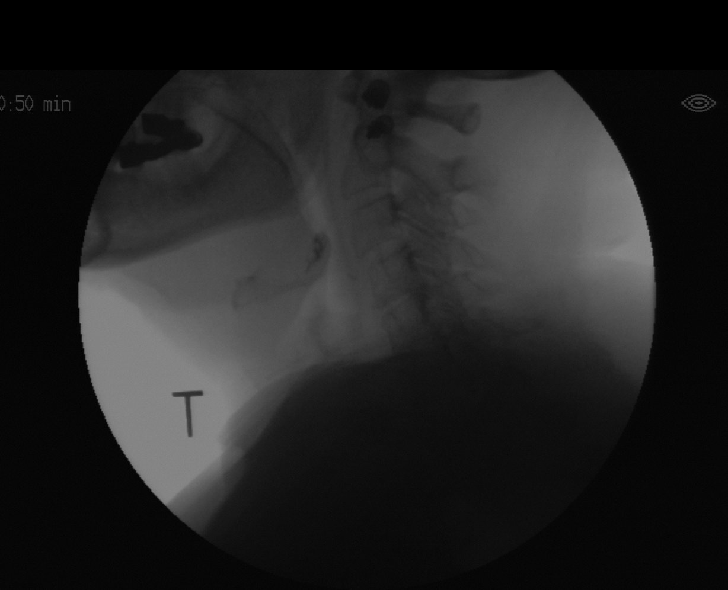

[12 of 24 positions shown; findings below may reference images not displayed]

FINDINGS: No laryngeal penetration or aspiration was demonstrated with any
consistency. The swallowing mechanism appears within normal limits.
Barium tablet was administered and passed without delay to the
gastroesophageal junction.
IMPRESSION: Unremarkable modified barium swallow.

Please refer to the Speech Pathologists report for complete details
and recommendations.

## 2021-11-08 MED ORDER — SULFAMETHOXAZOLE-TRIMETHOPRIM 800-160 MG PO TABS: 1.0000 | ORAL_TABLET | Freq: Two times a day (BID) | ORAL | 0 refills | Status: DC

## 2021-11-08 NOTE — Progress Notes (Signed)
Subjective:    Patient ID: Brandy Dunlap, female    DOB: September 05, 1950, 71 y.o.   MRN: [id number]  DOS:  11/08/2021 Type of visit - description: Acute  Symptoms started 3 days ago. Pain is located at the right flank.  No radiation. She thinks is a UTI, admits to some urinary frequency but no dysuria or gross hematuria. Denies nausea or vomiting No diarrhea or abdominal pain No vaginal discharge or bleeding Pain is worse with certain movements.   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   Essential hypertension 10/08/2007   Qualifier: Diagnosis of  By: Arnoldo Morale MD, Balinda Quails    GERD (gastroesophageal reflux disease)    History of kidney stones    per 09-02-2018 pre-op eval , suspected kidney stone , to [facility] CT abd on 7-9- for further eval , passive   Hypertension    Infectious disease 05/23/2015   Low back pain    Osteoarthritis    Pneumonia    Pre-diabetes    Prediabetes 07/09/2016   Referred otalgia of left ear 03/26/2016   Sinus trouble     Past Surgical History:  Procedure Laterality Date   CHOLECYSTECTOMY     COLONOSCOPY     COLONOSCOPY W/ BIOPSIES  10/06/2003   left knee replacement Left 05/2021   LUMBAR FUSION  2008   LUMBAR FUSION     2020   PARTIAL HIP ARTHROPLASTY Right 2009    hip replacement   TONSILLECTOMY     TOTAL HIP ARTHROPLASTY Left 09/10/2018   Procedure: TOTAL HIP ARTHROPLASTY ANTERIOR  APPROACH;  Surgeon: Gaynelle Arabian, MD;  Location: [facility];  Service: Orthopedics;  Laterality: Left;   TOTAL KNEE ARTHROPLASTY Right 11/16/2013   Procedure: RIGHT TOTAL KNEE ARTHROPLASTY;  Surgeon: Vickey Huger, MD;  Location: [place];  Service: Orthopedics;  Laterality: Right;   VIDEO BRONCHOSCOPY Bilateral 12/05/2012   Procedure: VIDEO BRONCHOSCOPY WITHOUT FLUORO;  Surgeon: Collene Gobble, MD;  [facility];  Service: Cardiopulmonary;  Laterality: Bilateral;    Current Outpatient Medications  Medication Instructions   Albuterol Sulfate, sensor, (PROAIR DIGIHALER) 108 (90 Base) MCG/ACT AEPB 1 puff, Inhalation, Daily   amLODipine (NORVASC) 5 mg, Oral, Daily   budesonide-formoterol (SYMBICORT) 160-4.5 MCG/ACT inhaler 2 puffs, Inhalation, 2 times daily   DEXILANT 60 MG capsule TAKE 1 CAPSULE BY MOUTH EVERY DAY   diclofenac sodium (VOLTAREN) 2 g, Topical, 4 times daily PRN   EPINEPHrine (EPI-PEN) 0.3 mg, As needed   montelukast (SINGULAIR) 10 mg, Oral, Daily at bedtime   omalizumab (XOLAIR) 150 mg, Subcutaneous, Every 14 days   traMADol (ULTRAM) 50-100 mg, Oral, Every 6 hours PRN       Objective:   Physical Exam BP 134/84   Pulse 79   Temp 98.4 F (36.9 C) (Oral)   Resp 16  Ht '5\' 3"'$  (1.6 m)   Wt 183 lb 8 oz (83.2 kg)   SpO2 93%   BMI 32.51 kg/m  General:   Well developed, NAD, BMI noted.  HEENT:  Normocephalic . Face symmetric, atraumatic Lungs:  CTA B Normal respiratory effort, no intercostal retractions, no accessory muscle use. Heart: RRR,  no murmur.  Abdomen:  Not distended, soft, non-tender. No rebound or rigidity. Flank: L normal R: No rash, slightly TTP, question of CVA tenderness Skin: Not pale. Not jaundice Lower extremities: no pretibial edema bilaterally  Neurologic:  alert & oriented X3.  Speech normal, gait appropriate for age and unassisted Psych--  Cognition and judgment appear intact.  Cooperative with normal attention span and concentration.   Behavior appropriate. No anxious or depressed appearing.     Assessment    71 year old female, PMH includes hypertension, asthma, DJD, presents with:  Flank pain: Symptoms started 3 days ago, as described above, Udip trace blood, no history of gross hematuria. DDx include MSK pain versus UTI versus stone (h/o urolithiasis). Plan: Push fluids, UA, urine culture, Bactrim DS, will get a renal ultrasound to rule out obstruction or possible stone. She did have trace blood in the urine, if she has microscopic hematuria and the UCX is negative we will have to consider further eval. We will call if no better, see AVS

## 2021-11-08 NOTE — Patient Instructions (Signed)
Start taking the antibiotic called Bactrim  Take Tylenol as needed for pain  Drink plenty of fluids  Call anytime if you have severe pain, high fever, rash, blood in the urine or if you are not gradually improving.  After treatment we may need to ask her to come back to have additional urine tests

## 2021-11-09 ENCOUNTER — Encounter: Payer: Self-pay | Admitting: Internal Medicine

## 2021-11-09 DIAGNOSIS — J4540 Moderate persistent asthma, uncomplicated: Secondary | ICD-10-CM | POA: Diagnosis not present

## 2021-11-09 LAB — URINALYSIS, ROUTINE W REFLEX MICROSCOPIC
Bilirubin Urine: NEGATIVE
Ketones, ur: NEGATIVE
Leukocytes,Ua: NEGATIVE
Nitrite: NEGATIVE
Specific Gravity, Urine: <=1.005 — AB (ref 1.000–1.030)
Total Protein, Urine: NEGATIVE
Urine Glucose: NEGATIVE
Urobilinogen, UA: 0.2 (ref 0.0–1.0)
pH: 6 (ref 5.0–8.0)

## 2021-11-09 LAB — URINE CULTURE
MICRO NUMBER:: 13911901
SPECIMEN QUALITY:: ADEQUATE

## 2021-11-10 ENCOUNTER — Ambulatory Visit: Payer: Medicare PPO | Admitting: Family

## 2021-11-14 ENCOUNTER — Telehealth: Payer: Self-pay

## 2021-11-14 NOTE — Telephone Encounter (Signed)
Left My Chart message per Dr. Wendy Poet note regarding CA score. Encouraged to call with any questions or concerns.

## 2021-11-21 ENCOUNTER — Encounter: Payer: Self-pay | Admitting: Podiatry

## 2021-11-21 ENCOUNTER — Ambulatory Visit: Payer: Medicare PPO | Admitting: Podiatry

## 2021-11-21 DIAGNOSIS — M778 Other enthesopathies, not elsewhere classified: Secondary | ICD-10-CM | POA: Diagnosis not present

## 2021-11-21 MED ORDER — TRIAMCINOLONE ACETONIDE 40 MG/ML IJ SUSP
20.0000 mg | Freq: Once | INTRAMUSCULAR | Status: AC
  Administered 2021-11-21: 20 mg

## 2021-11-21 NOTE — Progress Notes (Signed)
She presents today after having not seen her since March with a chief concern of pain across the dorsal aspect of the right foot.  States that she is really had a hard time with her left leg status post total knee replacement.  States that she had a blood clot behind the left knee and states that she feels like she is putting a lot more pressure on the right foot which is now resulting in tenderness to the right foot.  Objective: Vital signs stable tellurian x3.  Pulses are palpable.  Still has pain on palpation of the midfoot joints tarsometatarsal joints in particular.  Assessment: Capsulitis osteoarthritis dorsal aspect right foot.  Plan: I injected dorsal aspect of the right foot today 20 mg Kenalog 5 mg Marcaine point maximal tenderness.  Tolerated procedure well without complications follow-up with me as needed.

## 2021-11-22 ENCOUNTER — Ambulatory Visit: Payer: Medicare PPO | Admitting: Family

## 2021-11-22 VITALS — BP 135/58 | HR 74 | Temp 98.4°F | Resp 16 | Wt 183.0 lb

## 2021-11-22 DIAGNOSIS — H8112 Benign paroxysmal vertigo, left ear: Secondary | ICD-10-CM | POA: Diagnosis not present

## 2021-11-22 DIAGNOSIS — J209 Acute bronchitis, unspecified: Secondary | ICD-10-CM | POA: Diagnosis not present

## 2021-11-22 MED ORDER — PREDNISONE 10 MG PO TABS: ORAL_TABLET | ORAL | 0 refills | Status: DC

## 2021-11-22 MED ORDER — MECLIZINE HCL 12.5 MG PO TABS: 12.5000 mg | ORAL_TABLET | Freq: Three times a day (TID) | ORAL | 0 refills | Status: DC | PRN

## 2021-11-22 MED ORDER — SHINGRIX 50 MCG/0.5ML IM SUSR: INTRAMUSCULAR | 1 refills | Status: DC

## 2021-11-22 MED ORDER — AZITHROMYCIN 250 MG PO TABS: ORAL_TABLET | ORAL | 0 refills | Status: AC

## 2021-11-22 NOTE — Progress Notes (Signed)
Subjective:   By signing my name below, I, Brandy Dunlap, attest that this documentation has been prepared under the direction and in the presence of [name], NP 11/22/2021.   Patient ID: Brandy Dunlap, female    DOB: [birth year], 71 y.o.   MRN: [id number]  Chief Complaint  Patient presents with   Dizziness    Complains of dizziness that started 10 days ago. Started taking Meclizine 12.5    Cough    Complains of persistent cough x 6 days    HPI Patient is in today for office visit.  Vertigo: She complains about vertigo, which started 11/18/2021. She is taking 12.5 mg Meclizine, which has been helping her. Her symptoms worsen while bending down while standing. She reports she feels dizziness only on the left side.   Cold symptoms: She complains about cough, wheezing, and headache. She took OTC cough medicine. She denies nasal congestion or fever. She is still taking albuterol as needed.   Immunization: She is not interested in receiving the influenza vaccine during today's visit.    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   Essential hypertension 10/08/2007   Qualifier: Diagnosis of  By: Arnoldo Morale MD, Balinda Quails    GERD (gastroesophageal reflux disease)    History of kidney stones    per 09-02-2018 pre-op eval , suspected kidney stone , to [facility] CT abd on 7-9- for further eval , passive   Hypertension    Infectious disease 05/23/2015   Low  back pain    Osteoarthritis    Pneumonia    Pre-diabetes    Prediabetes 07/09/2016   Referred otalgia of left ear 03/26/2016   Sinus trouble     Past Surgical History:  Procedure Laterality Date   CHOLECYSTECTOMY     COLONOSCOPY     COLONOSCOPY W/ BIOPSIES  10/06/2003   left knee replacement Left 05/2021   LUMBAR FUSION  2008   LUMBAR FUSION     2020   PARTIAL HIP ARTHROPLASTY Right 2009    hip replacement   TONSILLECTOMY     TOTAL HIP ARTHROPLASTY Left 09/10/2018   Procedure: TOTAL HIP ARTHROPLASTY ANTERIOR APPROACH;  Surgeon: Brandy Arabian, MD;  Location: [facility];  Service: Orthopedics;  Laterality: Left;   TOTAL KNEE ARTHROPLASTY Right 11/16/2013   Procedure: RIGHT TOTAL KNEE ARTHROPLASTY;  Surgeon: Brandy Huger, MD;  Location: [place];  Service: Orthopedics;  Laterality: Right;   VIDEO BRONCHOSCOPY Bilateral 12/05/2012   Procedure: VIDEO BRONCHOSCOPY WITHOUT FLUORO;  Surgeon: Brandy Gobble, MD;  Location: [facility];  Service: Cardiopulmonary;  Laterality: Bilateral;    Family History  Problem Relation Age of Onset   COPD Mother    Heart disease Mother    Ovarian cancer Mother    Emphysema Mother    Hypertension Mother    Hyperlipidemia Mother    Heart Problems  Mother    Stroke Mother    Thyroid disease Mother    Cancer Mother    COPD Father        died at age 100   Emphysema Father    Heart Problems Father    Heart disease Brother        stent with 90%   Breast cancer Maternal Aunt    Colon cancer Neg Hx    Esophageal cancer Neg Hx    Rectal cancer Neg Hx    Stomach cancer Neg Hx    Colon polyps Neg Hx     Social History   Socioeconomic History   Marital status: Single    Spouse name: Not on file   Number of children: 1   Years of education: Not on file   Highest education level: Not on file  Occupational History   Occupation: Product manager: [employer]  Tobacco Use   Smoking status: Never    Passive exposure: Never    Smokeless tobacco: Never  Vaping Use   Vaping Use: Never used  Substance and Sexual Activity   Alcohol use: No   Drug use: No   Sexual activity: Not Currently    Partners: Male    Birth control/protection: Post-menopausal  Other Topics Concern   Not on file  Social History Narrative   Divorced   One daughter- lives locally and one grandson   Retired Pharmacist, hospital,  Has masters degree   Enjoys reading, spending time with her grandson   Social Determinants of Health   Financial Resource Strain: Low Risk  (04/11/2021)   Overall Financial Resource Strain (CARDIA)    Difficulty of Paying Living Expenses: Not hard at all  Food Insecurity: No Food Insecurity (04/11/2021)   Hunger Vital Sign    Worried About Running Out of Food in the Last Year: Never true    [place] in the Last Year: Never true  Transportation Needs: No Transportation Needs (04/11/2021)   PRAPARE - Hydrologist (Medical): No    Lack of Transportation (Non-Medical): No  Physical Activity: Insufficiently Active (04/11/2021)   Exercise Vital Sign    Days of Exercise per Week: 3 days    Minutes of Exercise per Session: 40 min  Stress: No Stress Concern Present (04/11/2021)   [place]    Feeling of Stress : Not at all  Social Connections: Moderately Isolated (04/11/2021)   Social Connection and Isolation Panel [NHANES]    Frequency of Communication with Friends and Family: More than three times a week    Frequency of Social Gatherings with Friends and Family: More than three times a week    Attends Religious Services: More than 4 times per year    Active Member of [employer] or Organizations: No    Attends Archivist Meetings: Never    Marital Status: Never married  Intimate Partner Violence: Not At Risk (04/11/2021)   Humiliation, Afraid, Rape, and Kick questionnaire    Fear of Current or Ex-Partner: No    Emotionally  Abused: No    Physically Abused: No    Sexually Abused: No    Outpatient Medications Prior to Visit  Medication Sig Dispense Refill   Albuterol Sulfate, sensor, (PROAIR DIGIHALER) 108 (90 Base) MCG/ACT AEPB Inhale 1 puff into the lungs daily.     amLODipine (NORVASC) 5 MG tablet TAKE 1 TABLET (5 MG TOTAL)  BY MOUTH DAILY. 90 tablet 1   budesonide-formoterol (SYMBICORT) 160-4.5 MCG/ACT inhaler Inhale 2 puffs into the lungs 2 (two) times daily.     DEXILANT 60 MG capsule TAKE 1 CAPSULE BY MOUTH EVERY DAY (Patient taking differently: Take 60 mg by mouth daily.) 90 capsule 1   diclofenac sodium (VOLTAREN) 1 % GEL Apply 2 g topically 4 (four) times daily as needed. (Patient taking differently: Apply 2 g topically 4 (four) times daily as needed (L knee pain).) 100 g 3   EPINEPHrine 0.3 mg/0.3 mL IJ SOAJ injection Inject 0.3 mg into the muscle as needed for anaphylaxis.     montelukast (SINGULAIR) 10 MG tablet Take 1 tablet (10 mg total) by mouth at bedtime. 30 tablet 3   omalizumab (XOLAIR) 150 MG injection Inject 150 mg into the skin every 14 (fourteen) days.     traMADol (ULTRAM) 50 MG tablet Take 50-100 mg by mouth every 6 (six) hours as needed for pain.     sulfamethoxazole-trimethoprim (BACTRIM DS) 800-160 MG tablet Take 1 tablet by mouth 2 (two) times daily. 14 tablet 0   No facility-administered medications prior to visit.    Allergies  Allergen Reactions   Augmentin [Amoxicillin-Pot Clavulanate] Diarrhea   Pantoprazole Sodium Palpitations    Review of Systems  Respiratory:  Positive for cough and wheezing.   Neurological:  Positive for dizziness and headaches.       Objective:    Physical Exam Constitutional:      General: She is not in acute distress.    Appearance: Normal appearance. She is not ill-appearing.  HENT:     Head: Normocephalic and atraumatic.     Right Ear: External ear normal.     Left Ear: External ear normal.  Eyes:     Extraocular Movements: Extraocular  movements intact.     Pupils: Pupils are equal, round, and reactive to light.  Cardiovascular:     Rate and Rhythm: Normal rate and regular rhythm.     Heart sounds: Normal heart sounds. No murmur heard.    No gallop.  Pulmonary:     Effort: Pulmonary effort is normal. No respiratory distress.     Breath sounds: Examination of the right-upper field reveals wheezing. Examination of the right-middle field reveals wheezing. Examination of the right-lower field reveals wheezing. Wheezing present. No rales.  Skin:    General: Skin is warm and dry.  Neurological:     Mental Status: She is alert and oriented to person, place, and time.  Psychiatric:        Judgment: Judgment normal.     BP (!) 135/58 (BP Location: Left Arm, Patient Position: Sitting, Cuff Size: Small)   Pulse 74   Temp 98.4 F (36.9 C) (Oral)   Resp 16   Wt 183 lb (83 kg)   SpO2 97%   BMI 32.42 kg/m  Wt Readings from Last 3 Encounters:  11/22/21 183 lb (83 kg)  11/08/21 183 lb 8 oz (83.2 kg)  10/31/21 181 lb (82.1 kg)     Assessment & Plan:   Problem List Items Addressed This Visit       Unprioritized   Bronchitis with bronchospasm    New. Will treat with prednisone taper, zpak. Continue albuterol and symbicort.       Benign paroxysmal positional vertigo - Primary    Uncontrolled, but notes some improvement with meclizine. Will continue meclizine prn and refer to PT for vestibular rehab.       Relevant  Medications   meclizine (ANTIVERT) 12.5 MG tablet   Other Relevant Orders   Ambulatory referral to Physical Therapy     Meds ordered this encounter  Medications   meclizine (ANTIVERT) 12.5 MG tablet    Sig: Take 1 tablet (12.5 mg total) by mouth 3 (three) times daily as needed for dizziness.    Dispense:  30 tablet    Refill:  0    Order Specific Question:   Supervising Provider    Answer:   Penni Homans A [[id number]]   predniSONE (DELTASONE) 10 MG tablet    Sig: 4 tabs by mouth once daily for 2 days,  then 3 tabs daily x 2 days, then 2 tabs daily x 2 days, then 1 tab daily x 2 days    Dispense:  20 tablet    Refill:  0    Order Specific Question:   Supervising Provider    Answer:   Penni Homans A [[id number]]   azithromycin (ZITHROMAX) 250 MG tablet    Sig: Take 2 tablets on day 1, then 1 tablet daily on days 2 through 5    Dispense:  6 tablet    Refill:  0    Order Specific Question:   Supervising Provider    Answer:   Penni Homans A [[id number]]   Zoster Vaccine Adjuvanted [facility]) injection    Sig: Inject 0.5 mcg IM now and again in 2-6 months.    Dispense:  0.5 mL    Refill:  1    Order Specific Question:   Supervising Provider    Answer:   Penni Homans A [[id number]]    I, Nance Pear, NP, personally preformed the services described in this documentation.  All medical record entries made by the scribe were at my direction and in my presence.  I have reviewed the chart and discharge instructions (if applicable) and agree that the record reflects my personal performance and is accurate and complete. 11/22/2021.  Jana Half O'Sullivan,acting as a Education administrator for Nance Pear, NP.,have documented all relevant documentation on the behalf of Nance Pear, NP,as directed by  Nance Pear, NP while in the presence of Nance Pear, NP.    Nance Pear, NP

## 2021-11-22 NOTE — Assessment & Plan Note (Signed)
Uncontrolled, but notes some improvement with meclizine. Will continue meclizine prn and refer to PT for vestibular rehab.

## 2021-11-22 NOTE — Assessment & Plan Note (Signed)
New. Will treat with prednisone taper, zpak. Continue albuterol and symbicort.

## 2021-11-23 ENCOUNTER — Encounter: Payer: Self-pay | Admitting: Physical Therapy

## 2021-11-23 ENCOUNTER — Ambulatory Visit: Payer: Medicare PPO | Attending: Family | Admitting: Physical Therapy

## 2021-11-23 DIAGNOSIS — H8111 Benign paroxysmal vertigo, right ear: Secondary | ICD-10-CM | POA: Diagnosis not present

## 2021-11-23 DIAGNOSIS — R42 Dizziness and giddiness: Secondary | ICD-10-CM | POA: Insufficient documentation

## 2021-11-23 DIAGNOSIS — H8112 Benign paroxysmal vertigo, left ear: Secondary | ICD-10-CM | POA: Insufficient documentation

## 2021-11-23 DIAGNOSIS — M542 Cervicalgia: Secondary | ICD-10-CM | POA: Diagnosis not present

## 2021-11-23 NOTE — Therapy (Signed)
OUTPATIENT PHYSICAL THERAPY VESTIBULAR EVALUATION     Patient Name: Brandy Dunlap MRN: [id number] DOB:September 29, 1950, 71 y.o., female Today's Date: 11/23/2021  PCP: [name], NP REFERRING PROVIDER: [name], NP   PT End of Session - 11/23/21 1323     Visit Number 1    Number of Visits 8    Date for PT Re-Evaluation 12/21/21    Authorization Type [employer] MCR    Progress Note Due on Visit 8    PT Start Time 1443    PT Stop Time 1415    PT Time Calculation (min) 57 min             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   Essential hypertension 10/08/2007   Qualifier: Diagnosis of  By: Arnoldo Morale MD, Balinda Quails    GERD (gastroesophageal reflux disease)    History of kidney stones    per 09-02-2018 pre-op eval , suspected kidney stone , to [facility] CT abd on 7-9- for further eval , passive   Hypertension    Infectious disease 05/23/2015   Low back pain    Osteoarthritis    Pneumonia    Pre-diabetes    Prediabetes 07/09/2016   Referred otalgia of left ear 03/26/2016   Sinus trouble    Past Surgical History:  Procedure Laterality Date   CHOLECYSTECTOMY     COLONOSCOPY     COLONOSCOPY W/ BIOPSIES  10/06/2003   left knee replacement Left 05/2021   LUMBAR FUSION  2008   LUMBAR FUSION     2020   PARTIAL HIP ARTHROPLASTY Right 2009    hip replacement   TONSILLECTOMY     TOTAL HIP ARTHROPLASTY Left 09/10/2018   Procedure: TOTAL  HIP ARTHROPLASTY ANTERIOR APPROACH;  Surgeon: Gaynelle Arabian, MD;  Location: [facility];  Service: Orthopedics;  Laterality: Left;   TOTAL KNEE ARTHROPLASTY Right 11/16/2013   Procedure: RIGHT TOTAL KNEE ARTHROPLASTY;  Surgeon: Vickey Huger, MD;  Location: [place];  Service: Orthopedics;  Laterality: Right;   VIDEO BRONCHOSCOPY Bilateral 12/05/2012   Procedure: VIDEO BRONCHOSCOPY WITHOUT FLUORO;  Surgeon: Collene Gobble, MD;  Location[facility];  Service: Cardiopulmonary;  Laterality: Bilateral;   Patient Active Problem List   Diagnosis Date Noted   Thrombosis of popliteal vein ([place]) 09/18/2021   Urinary urgency 08/01/2021   Benign paroxysmal positional vertigo 05/08/2021   Acute serous otitis media of left ear 05/08/2021   Preoperative examination 04/05/2021   Urine frequency 03/10/2021   Dyslipidemia 10/04/2020   Allergic rhinitis due to animal (cat) (dog) hair and dander 07/07/2020   Allergic rhinitis due to pollen 07/07/2020   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   Moderate persistent asthma, uncomplicated 15/40/0867   Pre-diabetes    History of kidney stones    Bronchitis with bronchospasm    IDA (iron deficiency anemia) 07/06/2020   Spinal stenosis, lumbar region  without neurogenic claudication 06/04/2019   Essential (primary) hypertension 05/01/2019   Body mass index (BMI) 35.0-35.9, adult 05/01/2019   Degenerative scoliosis in adult patient 05/01/2019   Asthma 04/18/2017   Cervical spondylosis without myelopathy 09/23/2015   Infectious disease 05/23/2015   Morbid obesity ([place]) 04/25/2015   Preop cardiovascular exam 10/25/2014   History of total knee arthroplasty 11/16/2013   Cough 06/26/2012   Cobalamin deficiency 09/21/2009   Allergic rhinitis 06/29/2009   Arthralgia of left temporomandibular joint 03/19/2008   Essential hypertension 10/08/2007   OSTEOARTHRITIS 10/08/2007   Osteoarthritis of hip 10/08/2007   Low back pain 07/24/2007   Gastroesophageal reflux  disease 07/24/2007   Colon polyp 10/06/2003    ONSET DATE: 2 weeks  REFERRING DIAG: H81.12 (ICD-10-CM) - Benign paroxysmal positional vertigo of left ear  THERAPY DIAG:  BPPV (benign paroxysmal positional vertigo), right  Dizziness and giddiness  Cervicalgia  Rationale for Evaluation and Treatment Rehabilitation  SUBJECTIVE:   SUBJECTIVE STATEMENT: Patient reports that vertigo returned about a week ago "almost ruining my beach vacation."  She reports symptoms rolling to her left and bending down.  She also reports more headache this time.   No other changes in medical history  since last seen in April for R sided BPPV other than L knee replacement and recent bough of Bronchitis - started last Thursday at beach, on Z-pack and prednisone.  She had a complicated course recovering from L TKA, with blood clot, has not regained full extension in L knee but pain has improved recently.   PERTINENT HISTORY: R TKA, L TKA, R hearing loss, arthritis neck, LBP, lumbar fusions  PAIN:  Are you having pain?  Slight headache  PRECAUTIONS: None  WEIGHT BEARING RESTRICTIONS No  FALLS: Has patient fallen in last 6 months? No  LIVING ENVIRONMENT:   PLOF: Independent  PATIENT GOALS get rid of vertigo  OBJECTIVE:   DIAGNOSTIC FINDINGS: NA  COGNITION: Overall cognitive status: Within functional limits for tasks assessed   SENSATION: Not tested  EDEMA:  NA  POSTURE: rounded shoulders and forward head   Cervical ROM:    Active AROM (deg) eval  Flexion 30  Extension 30  Right lateral flexion   Left lateral flexion   Right rotation 80  Left rotation 55  (Blank rows = not tested)  STRENGTH: 5/5 UE strength  LOWER EXTREMITY MMT:   MMT Right eval Left eval  Hip flexion 5 4  Hip abduction 5 5  Hip adduction 5 5  Knee flexion    Knee extension 5 4- lacking 15 deg extension  Ankle dorsiflexion    Ankle plantarflexion    (Blank rows = not tested)   GAIT: Gait  pattern: knee flexed in stance- Left Distance walked: 50' Assistive device utilized: Single point cane Level of assistance: Modified independence Comments:   FUNCTIONAL TESTs:    PATIENT SURVEYS:  DHI 24/100 = mild disability    VESTIBULAR ASSESSMENT   GENERAL OBSERVATION: enters independent with SPC, no apparent distress.     SYMPTOM BEHAVIOR:   Subjective history: symptoms started last week, also developed bronchitis around the same time.  Notices when rolling over and bending down to tie shoes.    Non-Vestibular symptoms: headaches   Type of dizziness: Spinning/Vertigo   Frequency: daily   Duration: 10 seconds   Aggravating factors: Induced by position change: rolling to the left and bending over   Relieving factors: head stationary   Progression of symptoms: unchanged   OCULOMOTOR EXAM:  Ocular Alignment: normal   Ocular ROM: No Limitations   Spontaneous Nystagmus: absent   Gaze-Induced Nystagmus: age appropriate nystagmus at end range   Smooth Pursuits: saccades especially with horizontal eye movements.    Saccades: hypometric/undershoots       VESTIBULAR - OCULAR REFLEX:    Slow VOR: Normal   VOR Cancellation: Normal   Head-Impulse Test: HIT Right: negative HIT Left: negative   Dynamic Visual Acuity:  NT    POSITIONAL TESTING: Right Dix-Hallpike: no nystagmus Left Dix-Hallpike: no nystagmus Right Roll Test: no nystagmus Left Roll Test: apogeotropic nystagmus and Duration: 15 secpmds    MOTION SENSITIVITY:    Motion Sensitivity Quotient  Intensity: 0 = none, 1 = Lightheaded, 2 = Mild, 3 = Moderate, 4 = Severe, 5 = Vomiting  Intensity  1. Sitting to supine 0  2. Supine to L side 3  3. Supine to R side 0  4. Supine to sitting 1  5. L Hallpike-Dix 0  6. Up from L  0  7. R Hallpike-Dix 0  8. Up from R  0  9. Sitting, head  tipped to L knee   10. Head up from L  knee   11. Sitting, head  tipped to R knee   12. Head up from R  knee   13. Sitting  head turns x5 1  14.Sitting head nods x5 0  15. In stance, 180  turn to L    16. In stance, 180  turn to R     OTHOSTATICS: NT    VESTIBULAR TREATMENT:  Canalith Repositioning:   BBQ Roll Left: Number of Reps: 1 and Response to Treatment: symptoms improved and Appiani/Gufoni Horizontal Apogeotropic: Number of Reps: 3 and Response to Treatment: comment: no change   PATIENT EDUCATION: Education details: findings, POC Person educated: Patient Education method: Explanation Education comprehension: verbalized understanding   GOALS: Goals reviewed with patient? Yes  SHORT TERM GOALS: Target date: 12/07/2021   Patient will reports solution of dizziness symptoms when rolling in bed.  Baseline: dizziness rolling to L side Goal status: INITIAL  2.  Patient will demonstrate independence with VOR exercises.  Baseline:  given previously Goal status: INITIAL   LONG TERM GOALS: Target date: 12/21/2021    Patient will report complete resolution of vertigo.  Baseline:  Goal status: INITIAL  2.  Patient will report 6 points or less on DHI Baseline: 24/100 = mild handicap Goal status: INITIAL  3.  Patient will demonstrate safety with gait and balance.  Baseline: using SPC Goal status: INITIAL  4. Patient will demonstrate improved cervical ROM for safety with driving. Baseline: see objective, L rotation restricted compared to R and previous episode.  Goal status: INITIAL  ASSESSMENT:  CLINICAL IMPRESSION: Shontell Prosser  is a 71 y.o. female who was seen today for physical therapy evaluation and treatment for BPPV.  She was treated previous April for R sided horizontal canalithiasis and returns today with similar symptoms on left.  She demonstrate ageotropic nystagmus x 15 seconds with L head roll today indicating left horizontal canalithiasis.   All other canals were negative.  Performed Gufoni x 3, symptoms improved minimally, so performed BBQ roll, this improved her  symptoms significantly, but she did not feel up to performing again today.  She still demonstrates increased saccades with horizontal eye movement, recommended resumption of VOR exercises, will review next session.  Bing Plume Law would benefit from skilled therapy to resolve BPPV, decrease dizziness, and decrease risk  of falls.   .    OBJECTIVE IMPAIRMENTS decreased activity tolerance, decreased balance, decreased ROM, dizziness, postural dysfunction, and pain.   ACTIVITY LIMITATIONS bending, squatting, bed mobility, and dressing  PARTICIPATION LIMITATIONS: cleaning and community activity  PERSONAL FACTORS Past/current experiences and 1-2 comorbidities: recent R knee surgery with complication, bronchitis, R hearing loss, cervical arthritis, lumbar fusion, LBP  are also affecting patient's functional outcome.   REHAB POTENTIAL: Excellent  CLINICAL DECISION MAKING: Evolving/moderate complexity  EVALUATION COMPLEXITY: Moderate   PLAN: PT FREQUENCY: 1-2x/week  PT DURATION: 4 weeks  PLANNED INTERVENTIONS: Therapeutic exercises, Therapeutic activity, Neuromuscular re-education, Balance training, Gait training, Patient/Family education, Self Care, Joint mobilization, Vestibular training, Canalith repositioning, Dry Needling, Electrical stimulation, Spinal mobilization, Cryotherapy, Moist heat, Ultrasound, Manual therapy, and Re-evaluation  PLAN FOR NEXT SESSION: reassess canals, BBQ roll L, review VOR, postural exercises for neck pain, balance testing.    Rennie Natter, PT, DPT  11/23/2021, 2:49 PM

## 2021-11-24 DIAGNOSIS — J4540 Moderate persistent asthma, uncomplicated: Secondary | ICD-10-CM | POA: Diagnosis not present

## 2021-11-27 ENCOUNTER — Encounter: Payer: Self-pay | Admitting: Physical Therapy

## 2021-11-27 ENCOUNTER — Ambulatory Visit: Payer: Medicare PPO | Attending: Family | Admitting: Physical Therapy

## 2021-11-27 DIAGNOSIS — H8112 Benign paroxysmal vertigo, left ear: Secondary | ICD-10-CM | POA: Diagnosis not present

## 2021-11-27 DIAGNOSIS — R42 Dizziness and giddiness: Secondary | ICD-10-CM | POA: Diagnosis not present

## 2021-11-27 DIAGNOSIS — M542 Cervicalgia: Secondary | ICD-10-CM | POA: Insufficient documentation

## 2021-11-27 NOTE — Therapy (Signed)
OUTPATIENT PHYSICAL THERAPY TREATMENT     Patient Name: Brandy Dunlap MRN: [id number] DOB:[birth year], 71 y.o., female Today's Date: 11/27/2021  PCP: [name], NP REFERRING PROVIDER: [name], NP   PT End of Session - 11/27/21 1008     Visit Number 2    Number of Visits 8    Date for PT Re-Evaluation 12/21/21    Authorization Type [employer] MCR    Progress Note Due on Visit 8    PT Start Time 1010    PT Stop Time 1051    PT Time Calculation (min) 41 min    Activity Tolerance Patient tolerated treatment well    Behavior During Therapy [facility] for tasks assessed/performed             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   Essential hypertension 10/08/2007   Qualifier: Diagnosis of  By: Arnoldo Morale MD, Balinda Quails    GERD (gastroesophageal reflux disease)    History of kidney stones    per 09-02-2018 pre-op eval , suspected kidney stone , to [facility] CT abd on 7-9- for further eval , passive   Hypertension    Infectious disease 05/23/2015   Low back pain    Osteoarthritis    Pneumonia    Pre-diabetes    Prediabetes 07/09/2016   Referred otalgia of left ear 03/26/2016   Sinus trouble    Past Surgical History:  Procedure Laterality Date   CHOLECYSTECTOMY     COLONOSCOPY     COLONOSCOPY W/ BIOPSIES  10/06/2003   left knee replacement Left 05/2021   LUMBAR FUSION  2008   LUMBAR FUSION     2020   PARTIAL HIP ARTHROPLASTY Right  2009    hip replacement   TONSILLECTOMY     TOTAL HIP ARTHROPLASTY Left 09/10/2018   Procedure: TOTAL HIP ARTHROPLASTY ANTERIOR APPROACH;  Surgeon: Gaynelle Arabian, MD;  Location: [facility];  Service: Orthopedics;  Laterality: Left;   TOTAL KNEE ARTHROPLASTY Right 11/16/2013   Procedure: RIGHT TOTAL KNEE ARTHROPLASTY;  Surgeon: Vickey Huger, MD;  Location: [place];  Service: Orthopedics;  Laterality: Right;   VIDEO BRONCHOSCOPY Bilateral 12/05/2012   Procedure: VIDEO BRONCHOSCOPY WITHOUT FLUORO;  Surgeon: Collene Gobble, MD;  [facility];  Service: Cardiopulmonary;  Laterality: Bilateral;   Patient Active Problem List   Diagnosis Date Noted   Thrombosis of popliteal vein ([place]) 09/18/2021   Urinary urgency 08/01/2021   Benign paroxysmal positional vertigo 05/08/2021   Acute serous otitis media of left ear 05/08/2021   Preoperative examination 04/05/2021   Urine frequency 03/10/2021   Dyslipidemia 10/04/2020   Allergic rhinitis due to animal (cat) (dog) hair and dander 07/07/2020   Allergic rhinitis due to pollen 07/07/2020   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   Moderate persistent asthma, uncomplicated 57/84/6962   Pre-diabetes    History of kidney stones  Bronchitis with bronchospasm    IDA (iron deficiency anemia) 07/06/2020   Spinal stenosis, lumbar region without neurogenic claudication 06/04/2019   Essential (primary) hypertension 05/01/2019   Body mass index (BMI) 35.0-35.9, adult 05/01/2019   Degenerative scoliosis in adult patient 05/01/2019   Asthma 04/18/2017   Cervical spondylosis without myelopathy 09/23/2015   Infectious disease 05/23/2015   Morbid obesity ([place]) 04/25/2015   Preop cardiovascular exam 10/25/2014   History of total knee arthroplasty 11/16/2013   Cough 06/26/2012   Cobalamin deficiency 09/21/2009   Allergic rhinitis 06/29/2009   Arthralgia of left temporomandibular joint 03/19/2008   Essential hypertension 10/08/2007   OSTEOARTHRITIS  10/08/2007   Osteoarthritis of hip 10/08/2007   Low back pain 07/24/2007   Gastroesophageal reflux disease 07/24/2007   Colon polyp 10/06/2003    ONSET DATE: 2 weeks  REFERRING DIAG: H81.12 (ICD-10-CM) - Benign paroxysmal positional vertigo of left ear  THERAPY DIAG:  BPPV (benign paroxysmal positional vertigo), left  Dizziness and giddiness  Cervicalgia  Rationale for Evaluation and Treatment Rehabilitation  SUBJECTIVE:   SUBJECTIVE STATEMENT: The spinning improved, but she had a rough weekend, because she woke up with a severe headache, then started spinning on the right side, but its a very slow spin.   Both are improved today 4/10 dizziness today with slight headache, yesterday was 8/10 with severe headache.   PERTINENT HISTORY: R TKA, L TKA, R hearing loss, arthritis neck, LBP, lumbar fusions  PAIN:  Are you having pain?  Slight headache    PRECAUTIONS: None  WEIGHT BEARING RESTRICTIONS No  FALLS: Has patient fallen in last 6 months? No  LIVING ENVIRONMENT:   PLOF: Independent  PATIENT GOALS get rid of vertigo  OBJECTIVE:   DIAGNOSTIC FINDINGS: NA  COGNITION: Overall cognitive status: Within functional limits for tasks assessed   SENSATION: Not tested  EDEMA:  NA  POSTURE: rounded shoulders and forward head   Cervical ROM:    Active AROM (deg) eval  Flexion 30  Extension 30  Right lateral flexion   Left lateral flexion   Right rotation 80  Left rotation 55  (Blank rows = not tested)  STRENGTH: 5/5 UE strength  LOWER EXTREMITY MMT:   MMT Right eval Left eval  Hip flexion 5 4  Hip abduction 5 5  Hip adduction 5 5  Knee flexion    Knee extension 5 4- lacking 15 deg extension  Ankle dorsiflexion    Ankle plantarflexion    (Blank rows = not tested)   GAIT: Gait pattern: knee flexed in stance- Left Distance walked: 50' Assistive device utilized: Single point cane Level of assistance: Modified independence Comments:    FUNCTIONAL TESTs:    PATIENT SURVEYS:  DHI 24/100 = mild disability    VESTIBULAR ASSESSMENT   GENERAL OBSERVATION: enters independent with SPC, no apparent distress.     SYMPTOM BEHAVIOR:   Subjective history: symptoms started last week, also developed bronchitis around the same time.  Notices when rolling over and bending down to tie shoes.    Non-Vestibular symptoms: headaches   Type of dizziness: Spinning/Vertigo   Frequency: daily   Duration: 10 seconds   Aggravating factors: Induced by position change: rolling to the left and bending over   Relieving factors: head stationary   Progression of symptoms: unchanged   OCULOMOTOR EXAM:   Ocular Alignment: normal   Ocular ROM: No Limitations   Spontaneous Nystagmus: absent   Gaze-Induced Nystagmus: age appropriate nystagmus at end range   Smooth Pursuits: saccades  especially with horizontal eye movements.    Saccades: hypometric/undershoots       VESTIBULAR - OCULAR REFLEX:    Slow VOR: Normal   VOR Cancellation: Normal   Head-Impulse Test: HIT Right: negative HIT Left: negative   Dynamic Visual Acuity:  NT    POSITIONAL TESTING: Right Dix-Hallpike: no nystagmus Left Dix-Hallpike: no nystagmus Right Roll Test: no nystagmus Left Roll Test: apogeotropic nystagmus and Duration: 15 secpmds    MOTION SENSITIVITY:    Motion Sensitivity Quotient  Intensity: 0 = none, 1 = Lightheaded, 2 = Mild, 3 = Moderate, 4 = Severe, 5 = Vomiting  Intensity  1. Sitting to supine 0  2. Supine to L side 3  3. Supine to R side 0  4. Supine to sitting 1  5. L Hallpike-Dix 0  6. Up from L  0  7. R Hallpike-Dix 0  8. Up from R  0  9. Sitting, head  tipped to L knee   10. Head up from L  knee   11. Sitting, head  tipped to R knee   12. Head up from R  knee   13. Sitting head turns x5 1  14.Sitting head nods x5 0  15. In stance, 180  turn to L    16. In stance, 180  turn to R     OTHOSTATICS: NT    VESTIBULAR  TREATMENT:  Canalith Repositioning:   BBQ Roll Left: Number of Reps: 1, Response to Treatment: symptoms improved, and Comment: no nystagmus, slight dizziness.   Manual Therapy: to decrease muscle spasm and headache and improve mobility STM to cervical paraspinals, PA mobs cervical spine grade 1-2, NAGS into rotations, gentle cervical traction   PATIENT EDUCATION: Education details: causes of dizziness Person educated: Patient Education method: Explanation Education comprehension: verbalized understanding   GOALS: Goals reviewed with patient? Yes  SHORT TERM GOALS: Target date: 12/11/2021   Patient will reports solution of dizziness symptoms when rolling in bed.  Baseline: dizziness rolling to L side Goal status: IN PROGRESS 11/27/21- no nystagmus or fast spin rolling to L  2.  Patient will demonstrate independence with VOR exercises.  Baseline:  given previously Goal status: IN PROGRESS   LONG TERM GOALS: Target date: 12/25/2021    Patient will report complete resolution of vertigo.  Baseline:  Goal status: IN PROGRESS  2.  Patient will report 6 points or less on DHI Baseline: 24/100 = mild handicap Goal status: IN PROGRESS  3.  Patient will demonstrate safety with gait and balance.  Baseline: using SPC Goal status: IN PROGRESS  4. Patient will demonstrate improved cervical ROM for safety with driving. Baseline: see objective, L rotation restricted compared to R and previous episode.  Goal status: IN PROGRESS  ASSESSMENT:  CLINICAL IMPRESSION: Naidelin Gugliotta Czajkowski reports worsened headache and dizzines this weekend, but no more spinning.  She reported more dizziness with L horizontal canalithiasis, but no nystagmus, but reported "feels like there is still something there trying to start" so performed L BBQ roll again to ensure canal fully cleared, followed by manual therapy to decrease possible cervigogenic component of dizziness and educated on different causes of  dizziness.  She reported headache resolved after manual therapy, but still had slight dizziness with supine to sit transfer, but roll test was negative bil.  Bing Plume Yost would benefit from continued skilled therapy to resolve BPPV, decrease dizziness, and decrease risk of falls.   .    OBJECTIVE IMPAIRMENTS decreased activity  tolerance, decreased balance, decreased ROM, dizziness, postural dysfunction, and pain.   ACTIVITY LIMITATIONS bending, squatting, bed mobility, and dressing  PARTICIPATION LIMITATIONS: cleaning and community activity  PERSONAL FACTORS Past/current experiences and 1-2 comorbidities: recent R knee surgery with complication, bronchitis, R hearing loss, cervical arthritis, lumbar fusion, LBP  are also affecting patient's functional outcome.   REHAB POTENTIAL: Excellent  CLINICAL DECISION MAKING: Evolving/moderate complexity  EVALUATION COMPLEXITY: Moderate   PLAN: PT FREQUENCY: 1-2x/week  PT DURATION: 4 weeks  PLANNED INTERVENTIONS: Therapeutic exercises, Therapeutic activity, Neuromuscular re-education, Balance training, Gait training, Patient/Family education, Self Care, Joint mobilization, Vestibular training, Canalith repositioning, Dry Needling, Electrical stimulation, Spinal mobilization, Cryotherapy, Moist heat, Ultrasound, Manual therapy, and Re-evaluation  PLAN FOR NEXT SESSION: reassess canals, BBQ roll L, review VOR, postural exercises for neck pain, balance testing.    Rennie Natter, PT, DPT  11/27/2021, 11:02 AM

## 2021-11-29 ENCOUNTER — Ambulatory Visit: Payer: Medicare PPO | Admitting: Physical Therapy

## 2021-11-29 ENCOUNTER — Encounter: Payer: Self-pay | Admitting: Physical Therapy

## 2021-11-29 ENCOUNTER — Ambulatory Visit (INDEPENDENT_AMBULATORY_CARE_PROVIDER_SITE_OTHER): Payer: Medicare PPO | Admitting: *Deleted

## 2021-11-29 DIAGNOSIS — M542 Cervicalgia: Secondary | ICD-10-CM | POA: Diagnosis not present

## 2021-11-29 DIAGNOSIS — H8112 Benign paroxysmal vertigo, left ear: Secondary | ICD-10-CM | POA: Diagnosis not present

## 2021-11-29 DIAGNOSIS — R42 Dizziness and giddiness: Secondary | ICD-10-CM | POA: Diagnosis not present

## 2021-11-29 DIAGNOSIS — Z23 Encounter for immunization: Secondary | ICD-10-CM | POA: Diagnosis not present

## 2021-11-29 NOTE — Therapy (Signed)
OUTPATIENT PHYSICAL THERAPY TREATMENT     Patient Name: Brandy Dunlap MRN: [id number] DOB:18-Aug-1950, 71 y.o., female Today's Date: 11/29/2021  PCP: [name], NP REFERRING PROVIDER: [name], NP   PT End of Session - 11/29/21 1455     Visit Number 3    Number of Visits 8    Date for PT Re-Evaluation 12/21/21    Authorization Type [employer] MCR    Progress Note Due on Visit 8    PT Start Time 1450    PT Stop Time 1527    PT Time Calculation (min) 37 min    Activity Tolerance Patient tolerated treatment well    Behavior During Therapy [facility] for tasks assessed/performed             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 abd on 7-9- for further eval , passive   Hypertension    Infectious disease 05/23/2015   Low back pain    Osteoarthritis    Pneumonia    Pre-diabetes    Prediabetes 07/09/2016   Referred otalgia of left ear 03/26/2016   Sinus trouble    Past Surgical History:  Procedure Laterality Date   CHOLECYSTECTOMY     COLONOSCOPY     COLONOSCOPY W/ BIOPSIES  10/06/2003   left knee replacement Left 05/2021   LUMBAR FUSION  2008   LUMBAR FUSION     2020   PARTIAL HIP ARTHROPLASTY Right  2009    hip replacement   TONSILLECTOMY     TOTAL HIP ARTHROPLASTY Left 09/10/2018   Procedure: TOTAL HIP ARTHROPLASTY ANTERIOR APPROACH;  Surgeon: Gaynelle Arabian, MD;  Location[facility];  Service: Orthopedics;  Laterality: Left;   TOTAL KNEE ARTHROPLASTY Right 11/16/2013   Procedure: RIGHT TOTAL KNEE ARTHROPLASTY;  Surgeon: Vickey Huger, MD;  Location: [place];  Service: Orthopedics;  Laterality: Right;   VIDEO BRONCHOSCOPY Bilateral 12/05/2012   Procedure: VIDEO BRONCHOSCOPY WITHOUT FLUORO;  Surgeon: Collene Gobble, MD;  [facility];  Service: Cardiopulmonary;  Laterality: Bilateral;   Patient Active Problem List   Diagnosis Date Noted   Thrombosis of popliteal vein ([place]) 09/18/2021   Urinary urgency 08/01/2021   Benign paroxysmal positional vertigo 05/08/2021   Acute serous otitis media of left ear 05/08/2021   Preoperative examination 04/05/2021   Urine frequency 03/10/2021   Dyslipidemia 10/04/2020   Allergic rhinitis due to animal (cat) (dog) hair and dander 07/07/2020   Allergic rhinitis due to pollen 07/07/2020   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   Moderate persistent asthma, uncomplicated 09/98/3382   Pre-diabetes    History of kidney stones  Bronchitis with bronchospasm    IDA (iron deficiency anemia) 07/06/2020   Spinal stenosis, lumbar region without neurogenic claudication 06/04/2019   Essential (primary) hypertension 05/01/2019   Body mass index (BMI) 35.0-35.9, adult 05/01/2019   Degenerative scoliosis in adult patient 05/01/2019   Asthma 04/18/2017   Cervical spondylosis without myelopathy 09/23/2015   Infectious disease 05/23/2015   Morbid obesity ([place]) 04/25/2015   Preop cardiovascular exam 10/25/2014   History of total knee arthroplasty 11/16/2013   Cough 06/26/2012   Cobalamin deficiency 09/21/2009   Allergic rhinitis 06/29/2009   Arthralgia of left temporomandibular joint 03/19/2008   Essential hypertension 10/08/2007   OSTEOARTHRITIS  10/08/2007   Osteoarthritis of hip 10/08/2007   Low back pain 07/24/2007   Gastroesophageal reflux disease 07/24/2007   Colon polyp 10/06/2003    ONSET DATE: 2 weeks  REFERRING DIAG: H81.12 (ICD-10-CM) - Benign paroxysmal positional vertigo of left ear  THERAPY DIAG:  BPPV (benign paroxysmal positional vertigo), left  Dizziness and giddiness  Cervicalgia  Rationale for Evaluation and Treatment Rehabilitation  SUBJECTIVE:   SUBJECTIVE STATEMENT: Patient reports she has been miserable last 2 days, the spinning is much worse when she lays down, and her neck was sore after worked on it, but then also reports she did a lot yesterday so may have overdone things.    PERTINENT HISTORY: R TKA, L TKA, R hearing loss, arthritis neck, LBP, lumbar fusions  PAIN:  Are you having pain?  Slight headache    PRECAUTIONS: None  WEIGHT BEARING RESTRICTIONS No  FALLS: Has patient fallen in last 6 months? No  LIVING ENVIRONMENT:   PLOF: Independent  PATIENT GOALS get rid of vertigo  OBJECTIVE:   DIAGNOSTIC FINDINGS: NA  COGNITION: Overall cognitive status: Within functional limits for tasks assessed   SENSATION: Not tested  EDEMA:  NA  POSTURE: rounded shoulders and forward head   Cervical ROM:    Active AROM (deg) eval  Flexion 30  Extension 30  Right lateral flexion   Left lateral flexion   Right rotation 80  Left rotation 55  (Blank rows = not tested)  STRENGTH: 5/5 UE strength  LOWER EXTREMITY MMT:   MMT Right eval Left eval  Hip flexion 5 4  Hip abduction 5 5  Hip adduction 5 5  Knee flexion    Knee extension 5 4- lacking 15 deg extension  Ankle dorsiflexion    Ankle plantarflexion    (Blank rows = not tested)   GAIT: Gait pattern: knee flexed in stance- Left Distance walked: 50' Assistive device utilized: Single point cane Level of assistance: Modified independence Comments:   FUNCTIONAL TESTs:    PATIENT SURVEYS:  DHI 24/100 = mild  disability    VESTIBULAR ASSESSMENT   GENERAL OBSERVATION: enters independent with SPC, no apparent distress.     SYMPTOM BEHAVIOR:   Subjective history: symptoms started last week, also developed bronchitis around the same time.  Notices when rolling over and bending down to tie shoes.    Non-Vestibular symptoms: headaches   Type of dizziness: Spinning/Vertigo   Frequency: daily   Duration: 10 seconds   Aggravating factors: Induced by position change: rolling to the left and bending over   Relieving factors: head stationary   Progression of symptoms: unchanged   OCULOMOTOR EXAM:   Ocular Alignment: normal   Ocular ROM: No Limitations   Spontaneous Nystagmus: absent   Gaze-Induced Nystagmus: age appropriate nystagmus at end range   Smooth Pursuits: saccades especially with horizontal eye movements.  Saccades: hypometric/undershoots       VESTIBULAR - OCULAR REFLEX:    Slow VOR: Normal   VOR Cancellation: Normal   Head-Impulse Test: HIT Right: negative HIT Left: negative   Dynamic Visual Acuity:  NT    POSITIONAL TESTING: Right Dix-Hallpike: no nystagmus Left Dix-Hallpike: no nystagmus Right Roll Test: no nystagmus Left Roll Test: apogeotropic nystagmus and Duration: 15 secpmds    MOTION SENSITIVITY:    Motion Sensitivity Quotient  Intensity: 0 = none, 1 = Lightheaded, 2 = Mild, 3 = Moderate, 4 = Severe, 5 = Vomiting  Intensity  1. Sitting to supine 0  2. Supine to L side 3  3. Supine to R side 0  4. Supine to sitting 1  5. L Hallpike-Dix 0  6. Up from L  0  7. R Hallpike-Dix 0  8. Up from R  0  9. Sitting, head  tipped to L knee   10. Head up from L  knee   11. Sitting, head  tipped to R knee   12. Head up from R  knee   13. Sitting head turns x5 1  14.Sitting head nods x5 0  15. In stance, 180  turn to L    16. In stance, 180  turn to R     OTHOSTATICS: NT   TREATMENT: 11/29/21 Canalith Repositioning:   Epley Left: Number of Reps: 2 and  Response to Treatment: symptoms resolved   Manual Therapy: to decrease muscle spasm and pain and improve mobility.  STM/TPR to bil UT, cervical paraspaneals, levator scapulae.    11/27/21 Canalith Repositioning:   BBQ Roll Left: Number of Reps: 1, Response to Treatment: symptoms improved, and Comment: no nystagmus, slight dizziness.   Manual Therapy: to decrease muscle spasm and headache and improve mobility STM to cervical paraspinals, PA mobs cervical spine grade 1-2, NAGS into rotations, gentle cervical traction   PATIENT EDUCATION: Education details: precautions after CRP Person educated: Patient Education method: Explanation Education comprehension: verbalized understanding   GOALS: Goals reviewed with patient? Yes  SHORT TERM GOALS: Target date: 12/13/2021   Patient will reports solution of dizziness symptoms when rolling in bed.  Baseline: dizziness rolling to L side Goal status: IN PROGRESS   2.  Patient will demonstrate independence with VOR exercises.  Baseline:  given previously Goal status: IN PROGRESS   LONG TERM GOALS: Target date: 12/27/2021    Patient will report complete resolution of vertigo.  Baseline:  Goal status: IN PROGRESS  2.  Patient will report 6 points or less on DHI Baseline: 24/100 = mild handicap Goal status: IN PROGRESS  3.  Patient will demonstrate safety with gait and balance.  Baseline: using SPC Goal status: IN PROGRESS  4. Patient will demonstrate improved cervical ROM for safety with driving. Baseline: see objective, L rotation restricted compared to R and previous episode.  Goal status: IN PROGRESS  ASSESSMENT:  CLINICAL IMPRESSION: Thelma Lorenzetti Brouwer reported increased dizziness after last session.  She had positive dix hallpike on L with upward beating rotary nystagmus, indicating conversion to L posterior canalithiasis, after 2 rounds of Epley maneuver retest of all canals was negative and reported decreased dizziness.   Noted tightness throughout cervical musculature, palpation of L suboccipitals still produced some dizziness indicating continued cervicogenic dizziness as well.  Bing Plume Kassim would benefit from continued skilled therapy to resolve BPPV, decrease dizziness, and decrease risk of falls.   .    OBJECTIVE IMPAIRMENTS decreased activity tolerance, decreased balance,  decreased ROM, dizziness, postural dysfunction, and pain.   ACTIVITY LIMITATIONS bending, squatting, bed mobility, and dressing  PARTICIPATION LIMITATIONS: cleaning and community activity  PERSONAL FACTORS Past/current experiences and 1-2 comorbidities: recent R knee surgery with complication, bronchitis, R hearing loss, cervical arthritis, lumbar fusion, LBP  are also affecting patient's functional outcome.   REHAB POTENTIAL: Excellent  CLINICAL DECISION MAKING: Evolving/moderate complexity  EVALUATION COMPLEXITY: Moderate   PLAN: PT FREQUENCY: 1-2x/week  PT DURATION: 4 weeks  PLANNED INTERVENTIONS: Therapeutic exercises, Therapeutic activity, Neuromuscular re-education, Balance training, Gait training, Patient/Family education, Self Care, Joint mobilization, Vestibular training, Canalith repositioning, Dry Needling, Electrical stimulation, Spinal mobilization, Cryotherapy, Moist heat, Ultrasound, Manual therapy, and Re-evaluation  PLAN FOR NEXT SESSION: reassess canals, postural exercises for neck pain, balance testing.    Rennie Natter, PT, DPT  11/29/2021, 6:11 PM

## 2021-11-29 NOTE — Progress Notes (Signed)
Patient here for flu vaccine.  Vaccine given in left deltoid and patient tolerated well. 

## 2021-12-01 DIAGNOSIS — Z96641 Presence of right artificial hip joint: Secondary | ICD-10-CM | POA: Diagnosis not present

## 2021-12-01 DIAGNOSIS — Z96642 Presence of left artificial hip joint: Secondary | ICD-10-CM | POA: Diagnosis not present

## 2021-12-04 ENCOUNTER — Encounter: Payer: Self-pay | Admitting: Physical Therapy

## 2021-12-04 ENCOUNTER — Ambulatory Visit: Payer: Medicare PPO | Admitting: Physical Therapy

## 2021-12-04 DIAGNOSIS — M542 Cervicalgia: Secondary | ICD-10-CM

## 2021-12-04 DIAGNOSIS — R42 Dizziness and giddiness: Secondary | ICD-10-CM | POA: Diagnosis not present

## 2021-12-04 DIAGNOSIS — H8112 Benign paroxysmal vertigo, left ear: Secondary | ICD-10-CM | POA: Diagnosis not present

## 2021-12-04 NOTE — Therapy (Addendum)
OUTPATIENT PHYSICAL THERAPY TREATMENT PHYSICAL THERAPY DISCHARGE SUMMARY  Visits from Start of Care: 4  Current functional level related to goals / functional outcomes: Resolution of BPPV   Remaining deficits: Decreased cervical ROM, possible cervicogenic dizzines   Education / Equipment: HEP for habituation and cervical stretches  Plan: Patient agrees to discharge.  Patient is being discharged due to not needing to return to therapy after 12/04/2021 visit.  She will ask for new referral if symptoms return.    Brandy Dunlap, PT, DPT 10:01 AM 01/05/2022      Patient Name: Brandy Dunlap MRN: [id number] DOB:February 13, 1951, 71 y.o., female Today's Date: 12/04/2021  PCP: [name], NP REFERRING PROVIDER: [name], NP   PT End of Session - 12/04/21 1731     Visit Number 4    Number of Visits 8    Date for PT Re-Evaluation 12/21/21    Authorization Type [employer] MCR    Progress Note Due on Visit 8    PT Start Time 1700    PT Stop Time 1740    PT Time Calculation (min) 40 min    Activity Tolerance Patient tolerated treatment well    Behavior During Therapy [facility] for tasks assessed/performed             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   Essential hypertension 10/08/2007   Qualifier: Diagnosis of  By: Arnoldo Morale MD, Balinda Quails    GERD (gastroesophageal reflux disease)    History of kidney stones    per 09-02-2018 pre-op  eval , suspected kidney stone , to [facility] CT abd on 7-9- for further eval , passive   Hypertension    Infectious disease 05/23/2015   Low back pain    Osteoarthritis    Pneumonia    Pre-diabetes    Prediabetes 07/09/2016   Referred otalgia of left ear 03/26/2016   Sinus trouble    Past Surgical History:  Procedure Laterality Date   CHOLECYSTECTOMY     COLONOSCOPY     COLONOSCOPY W/ BIOPSIES  10/06/2003   left knee replacement Left 05/2021   LUMBAR FUSION  2008   LUMBAR FUSION     2020   PARTIAL HIP ARTHROPLASTY Right 2009    hip replacement   TONSILLECTOMY     TOTAL HIP ARTHROPLASTY Left 09/10/2018   Procedure: TOTAL HIP ARTHROPLASTY ANTERIOR APPROACH;  Surgeon: Gaynelle Arabian, MD;  Location: [facility];  Service: Orthopedics;  Laterality: Left;   TOTAL KNEE ARTHROPLASTY Right 11/16/2013   Procedure: RIGHT TOTAL KNEE ARTHROPLASTY;  Surgeon: Vickey Huger, MD;  Location: [place];  Service: Orthopedics;  Laterality: Right;   VIDEO BRONCHOSCOPY Bilateral 12/05/2012   Procedure: VIDEO BRONCHOSCOPY WITHOUT FLUORO;  Surgeon: Collene Gobble, MD;  [facility];  Service: Cardiopulmonary;  Laterality: Bilateral;   Patient Active Problem List   Diagnosis Date Noted  Thrombosis of popliteal vein (HCC) 09/18/2021   Urinary urgency 08/01/2021   Benign paroxysmal positional vertigo 05/08/2021   Acute serous otitis media of left ear 05/08/2021   Preoperative examination 04/05/2021   Urine frequency 03/10/2021   Dyslipidemia 10/04/2020   Allergic rhinitis due to animal (cat) (dog) hair and dander 07/07/2020   Allergic rhinitis due to pollen 07/07/2020   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   Moderate persistent asthma, uncomplicated 76/73/4193   Pre-diabetes    History of kidney stones    Bronchitis with bronchospasm    IDA (iron deficiency anemia) 07/06/2020   Spinal stenosis, lumbar region without neurogenic claudication 06/04/2019   Essential (primary) hypertension  05/01/2019   Body mass index (BMI) 35.0-35.9, adult 05/01/2019   Degenerative scoliosis in adult patient 05/01/2019   Asthma 04/18/2017   Cervical spondylosis without myelopathy 09/23/2015   Infectious disease 05/23/2015   Morbid obesity ([place]) 04/25/2015   Preop cardiovascular exam 10/25/2014   History of total knee arthroplasty 11/16/2013   Cough 06/26/2012   Cobalamin deficiency 09/21/2009   Allergic rhinitis 06/29/2009   Arthralgia of left temporomandibular joint 03/19/2008   Essential hypertension 10/08/2007   OSTEOARTHRITIS 10/08/2007   Osteoarthritis of hip 10/08/2007   Low back pain 07/24/2007   Gastroesophageal reflux disease 07/24/2007   Colon polyp 10/06/2003    ONSET DATE: 2 weeks  REFERRING DIAG: H81.12 (ICD-10-CM) - Benign paroxysmal positional vertigo of left ear  THERAPY DIAG:  BPPV (benign paroxysmal positional vertigo), left  Dizziness and giddiness  Cervicalgia  Rationale for Evaluation and Treatment Rehabilitation  SUBJECTIVE:   SUBJECTIVE STATEMENT: Patient reports no spinning since last session, but still feels dizzy with position changes.  Has headache and more neck pain today.     PERTINENT HISTORY: R TKA, L TKA, R hearing loss, arthritis neck, LBP, lumbar fusions  PAIN:  Are you having pain? Yes: NPRS scale: 4/10 Pain location: neck Pain description: headache    PRECAUTIONS: None  WEIGHT BEARING RESTRICTIONS No  FALLS: Has patient fallen in last 6 months? No  LIVING ENVIRONMENT:   PLOF: Independent  PATIENT GOALS get rid of vertigo  OBJECTIVE:   DIAGNOSTIC FINDINGS: NA  COGNITION: Overall cognitive status: Within functional limits for tasks assessed   SENSATION: Not tested  EDEMA:  NA  POSTURE: rounded shoulders and forward head   Cervical ROM:    Active AROM (deg) eval  Flexion 30  Extension 30  Right lateral flexion   Left lateral flexion   Right rotation 80  Left rotation 55  (Blank rows = not  tested)  STRENGTH: 5/5 UE strength  LOWER EXTREMITY MMT:   MMT Right eval Left eval  Hip flexion 5 4  Hip abduction 5 5  Hip adduction 5 5  Knee flexion    Knee extension 5 4- lacking 15 deg extension  Ankle dorsiflexion    Ankle plantarflexion    (Blank rows = not tested)   GAIT: Gait pattern: knee flexed in stance- Left Distance walked: 50' Assistive device utilized: Single point cane Level of assistance: Modified independence Comments:   FUNCTIONAL TESTs:    PATIENT SURVEYS:  DHI 24/100 = mild disability    VESTIBULAR ASSESSMENT   GENERAL OBSERVATION: enters independent with SPC, no apparent distress.     SYMPTOM BEHAVIOR:   Subjective history: symptoms started last week, also developed bronchitis around the same time.  Notices when rolling over and bending down to tie shoes.    Non-Vestibular symptoms: headaches   Type of dizziness:  Spinning/Vertigo   Frequency: daily   Duration: 10 seconds   Aggravating factors: Induced by position change: rolling to the left and bending over   Relieving factors: head stationary   Progression of symptoms: unchanged   OCULOMOTOR EXAM:   Ocular Alignment: normal   Ocular ROM: No Limitations   Spontaneous Nystagmus: absent   Gaze-Induced Nystagmus: age appropriate nystagmus at end range   Smooth Pursuits: saccades especially with horizontal eye movements.    Saccades: hypometric/undershoots       VESTIBULAR - OCULAR REFLEX:    Slow VOR: Normal   VOR Cancellation: Normal   Head-Impulse Test: HIT Right: negative HIT Left: negative   Dynamic Visual Acuity:  NT    POSITIONAL TESTING: Right Dix-Hallpike: no nystagmus Left Dix-Hallpike: no nystagmus Right Roll Test: no nystagmus Left Roll Test: apogeotropic nystagmus and Duration: 15 secpmds    MOTION SENSITIVITY:    Motion Sensitivity Quotient  Intensity: 0 = none, 1 = Lightheaded, 2 = Mild, 3 = Moderate, 4 = Severe, 5 = Vomiting  Intensity  1. Sitting to supine  0  2. Supine to L side 3  3. Supine to R side 0  4. Supine to sitting 1  5. L Hallpike-Dix 0  6. Up from L  0  7. R Hallpike-Dix 0  8. Up from R  0  9. Sitting, head  tipped to L knee   10. Head up from L  knee   11. Sitting, head  tipped to R knee   12. Head up from R  knee   13. Sitting head turns x5 1  14.Sitting head nods x5 0  15. In stance, 180  turn to L    16. In stance, 180  turn to R     OTHOSTATICS: NT   TREATMENT: 12/04/2021 Therapeutic Activity:  Delight Stare pike and roll testing for horizontal and posterior canalithiasis.  Manual Therapy: to decrease muscle spasm and pain and improve mobility.  STM/TPR to bil UT, cervical paraspaneals, levator scapulae.  Self Care: education on HEP for habituation exercises - VOR, brandt-dardoff, cervical stretches, also given handouts for self CRP.    11/29/21 Canalith Repositioning:   Epley Left: Number of Reps: 2 and Response to Treatment: symptoms resolved   Manual Therapy: to decrease muscle spasm and pain and improve mobility.  STM/TPR to bil UT, cervical paraspaneals, levator scapulae.    11/27/21 Canalith Repositioning:   BBQ Roll Left: Number of Reps: 1, Response to Treatment: symptoms improved, and Comment: no nystagmus, slight dizziness.   Manual Therapy: to decrease muscle spasm and headache and improve mobility STM to cervical paraspinals, PA mobs cervical spine grade 1-2, NAGS into rotations, gentle cervical traction   PATIENT EDUCATION: Education details: HEP for VOR and habituation exercises, neck stretches Person educated: Patient Education method: Explanation, Demonstration, and Handouts Education comprehension: verbalized understanding  HOME EXERCISE PROGRAM:  Access Code: [email or web address] URL: [email or web address] Date: 12/04/2021 Prepared by: Glenetta Hew  Exercises - Seated Gaze Stabilization with Head Rotation  - 3 x daily - 7 x weekly - 3 sets - 15 reps - Seated Gaze Stabilization  with Head Nod  - 3 x daily - 7 x weekly - 3 sets - 15 reps - Brandt-Daroff Vestibular Exercise  - 1 x daily - 7 x weekly - 1 sets - 5 reps - Seated to Fold Over Vestibular Habituation  - 3 x daily - 7 x weekly - 1 sets - 10 reps - Gentle Levator Scapulae Stretch  -  1 x daily - 7 x weekly - 1 sets - 3 reps - 15 sec  hold - Seated Gentle Upper Trapezius Stretch  - 1 x daily - 7 x weekly - 1 sets - 3 reps - 15 sec  hold - Self-Epley Maneuver Left Ear  - 1 x daily - 7 x weekly - 3 sets - 10 reps  GOALS: Goals reviewed with patient? Yes  SHORT TERM GOALS: Target date: 12/18/2021   Patient will reports solution of dizziness symptoms when rolling in bed.  Baseline: dizziness rolling to L side Goal status: MET   2.  Patient will demonstrate independence with VOR exercises.  Baseline:  given previously Goal status: MET   LONG TERM GOALS: Target date: 01/01/2022    Patient will report complete resolution of vertigo.  Baseline:  Goal status: IN PROGRESS  2.  Patient will report 6 points or less on DHI Baseline: 24/100 = mild handicap Goal status: IN PROGRESS  3.  Patient will demonstrate safety with gait and balance.  Baseline: using SPC Goal status: IN PROGRESS  4. Patient will demonstrate improved cervical ROM for safety with driving. Baseline: see objective, L rotation restricted compared to R and previous episode.  Goal status: IN PROGRESS  ASSESSMENT:  CLINICAL IMPRESSION: Brandy Dunlap had no symptoms or nystagmus with BPPV testing today of bil posterior and horizontal canals, and reports no spinning rolling over in bed, demonstrating resolution of BPPV, but continues to report sensation of dizziness, discussed today that this may be from the neck pain/tightness as well as sensitization of vestibular system, recommended restarting VOR exercises for habituation, also explained other habituation exercises for larger head movements, declined to try today.  She responded well to  manual therapy with no headache afterwards, demonstrates a lot of tightness in cervical musculature and UT/LS, added stretches to HEP as well.  Brandy Dunlap would benefit from continued skilled therapy to resolve BPPV, decrease dizziness, and decrease risk of falls.   .    OBJECTIVE IMPAIRMENTS decreased activity tolerance, decreased balance, decreased ROM, dizziness, postural dysfunction, and pain.   ACTIVITY LIMITATIONS bending, squatting, bed mobility, and dressing  PARTICIPATION LIMITATIONS: cleaning and community activity  PERSONAL FACTORS Past/current experiences and 1-2 comorbidities: recent R knee surgery with complication, bronchitis, R hearing loss, cervical arthritis, lumbar fusion, LBP  are also affecting patient's functional outcome.   REHAB POTENTIAL: Excellent  CLINICAL DECISION MAKING: Evolving/moderate complexity  EVALUATION COMPLEXITY: Moderate   PLAN: PT FREQUENCY: 1-2x/week  PT DURATION: 4 weeks  PLANNED INTERVENTIONS: Therapeutic exercises, Therapeutic activity, Neuromuscular re-education, Balance training, Gait training, Patient/Family education, Self Care, Joint mobilization, Vestibular training, Canalith repositioning, Dry Needling, Electrical stimulation, Spinal mobilization, Cryotherapy, Moist heat, Ultrasound, Manual therapy, and Re-evaluation  PLAN FOR NEXT SESSION: postural exercises for neck pain, balance testing.    Brandy Dunlap, PT, DPT  12/04/2021, 5:56 PM

## 2021-12-06 DIAGNOSIS — J4540 Moderate persistent asthma, uncomplicated: Secondary | ICD-10-CM | POA: Diagnosis not present

## 2021-12-21 DIAGNOSIS — Z96652 Presence of left artificial knee joint: Secondary | ICD-10-CM | POA: Diagnosis not present

## 2021-12-21 DIAGNOSIS — J4540 Moderate persistent asthma, uncomplicated: Secondary | ICD-10-CM | POA: Diagnosis not present

## 2022-01-04 DIAGNOSIS — J301 Allergic rhinitis due to pollen: Secondary | ICD-10-CM | POA: Diagnosis not present

## 2022-01-04 DIAGNOSIS — J4550 Severe persistent asthma, uncomplicated: Secondary | ICD-10-CM | POA: Diagnosis not present

## 2022-01-04 DIAGNOSIS — H1045 Other chronic allergic conjunctivitis: Secondary | ICD-10-CM | POA: Diagnosis not present

## 2022-01-04 DIAGNOSIS — J3081 Allergic rhinitis due to animal (cat) (dog) hair and dander: Secondary | ICD-10-CM | POA: Diagnosis not present

## 2022-01-04 DIAGNOSIS — J3089 Other allergic rhinitis: Secondary | ICD-10-CM | POA: Diagnosis not present

## 2022-01-11 DIAGNOSIS — J4540 Moderate persistent asthma, uncomplicated: Secondary | ICD-10-CM | POA: Diagnosis not present

## 2022-01-12 DIAGNOSIS — J4550 Severe persistent asthma, uncomplicated: Secondary | ICD-10-CM | POA: Diagnosis not present

## 2022-01-16 ENCOUNTER — Encounter: Payer: Self-pay | Admitting: Podiatry

## 2022-01-16 ENCOUNTER — Ambulatory Visit: Payer: Medicare PPO | Admitting: Podiatry

## 2022-01-16 ENCOUNTER — Ambulatory Visit (INDEPENDENT_AMBULATORY_CARE_PROVIDER_SITE_OTHER): Payer: Medicare PPO

## 2022-01-16 DIAGNOSIS — M778 Other enthesopathies, not elsewhere classified: Secondary | ICD-10-CM

## 2022-01-16 DIAGNOSIS — M109 Gout, unspecified: Secondary | ICD-10-CM

## 2022-01-16 MED ORDER — METHYLPREDNISOLONE 4 MG PO TBPK: ORAL_TABLET | ORAL | 0 refills | Status: DC

## 2022-01-16 MED ORDER — TRIAMCINOLONE ACETONIDE 40 MG/ML IJ SUSP
20.0000 mg | Freq: Once | INTRAMUSCULAR | Status: AC
  Administered 2022-01-16: 20 mg

## 2022-01-21 NOTE — Progress Notes (Signed)
She presents today chief complaint of pain in her left foot which started Saturday morning early.  She states that she woke up with it.  She states that she has been icing the foot and keeping it elevated since Sunday she has noticed some swelling she has had sharp shooting pains when she tried to walk on it but mainly just feels sore.  Patient goes on to say that she did not fall have any trauma to the foot drop anything on the foot that she knows of.  Denies history of gout or any seropositive arthropathies.  Objective: Vital signs are stable alert and oriented x 3.  Pulses are palpable.  Left foot does demonstrate swelling over the lesser metatarsals and pain on palpation with mild erythema no cellulitis but exquisite pain on deep palpation.  Patient does state that the swelling was worse the heat was worse and the redness was worse on Saturday and Sunday.  Radiographs taken today do not demonstrate any osseous abnormalities all the margins appear to be congruous and intact.  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  Possible gouty capsulitis.  Plan: Discussed etiology pathology conservative versus surgical therapies.  Placed her on methylprednisolone and requested blood work.  Will follow-up with her once this comes in.  Otherwise I would like to see her in 4 weeks

## 2022-01-22 ENCOUNTER — Other Ambulatory Visit: Payer: Self-pay | Admitting: Family

## 2022-01-22 DIAGNOSIS — M109 Gout, unspecified: Secondary | ICD-10-CM | POA: Diagnosis not present

## 2022-01-22 DIAGNOSIS — M778 Other enthesopathies, not elsewhere classified: Secondary | ICD-10-CM | POA: Diagnosis not present

## 2022-01-23 LAB — CBC WITH DIFFERENTIAL/PLATELET
Absolute Monocytes: 425 cells/uL (ref 200–950)
Basophils Absolute: 55 cells/uL (ref 0–200)
Basophils Relative: 0.4 %
Eosinophils Absolute: 14 cells/uL — ABNORMAL LOW (ref 15–500)
Eosinophils Relative: 0.1 %
HCT: 42.1 % (ref 35.0–45.0)
Hemoglobin: 14.3 g/dL (ref 11.7–15.5)
Lymphs Abs: 1247 cells/uL (ref 850–3900)
MCH: 30.7 pg (ref 27.0–33.0)
MCHC: 34 g/dL (ref 32.0–36.0)
MCV: 90.3 fL (ref 80.0–100.0)
MPV: 11.3 fL (ref 7.5–12.5)
Monocytes Relative: 3.1 %
Neutro Abs: 11960 cells/uL — ABNORMAL HIGH (ref 1500–7800)
Neutrophils Relative %: 87.3 %
Platelets: 239 10*3/uL (ref 140–400)
RBC: 4.66 10*6/uL (ref 3.80–5.10)
RDW: 13.4 % (ref 11.0–15.0)
Total Lymphocyte: 9.1 %
WBC: 13.7 10*3/uL — ABNORMAL HIGH (ref 3.8–10.8)

## 2022-01-23 LAB — URIC ACID: Uric Acid, Serum: 5.4 mg/dL (ref 2.5–7.0)

## 2022-01-23 LAB — ANA: Anti Nuclear Antibody (ANA): NEGATIVE

## 2022-01-23 LAB — C-REACTIVE PROTEIN: CRP: 0.6 mg/L (ref <8.0)

## 2022-01-23 LAB — RHEUMATOID FACTOR: Rheumatoid fact SerPl-aCnc: <14 IU/mL (ref <14)

## 2022-01-23 LAB — SEDIMENTATION RATE: Sed Rate: 6 mm/h (ref 0–30)

## 2022-01-25 ENCOUNTER — Encounter: Payer: Self-pay | Admitting: Podiatry

## 2022-01-25 DIAGNOSIS — J4540 Moderate persistent asthma, uncomplicated: Secondary | ICD-10-CM | POA: Diagnosis not present

## 2022-01-30 ENCOUNTER — Encounter: Payer: Self-pay | Admitting: Family

## 2022-01-30 ENCOUNTER — Ambulatory Visit: Payer: Medicare PPO | Admitting: Family

## 2022-01-30 VITALS — BP 132/56 | HR 75 | Temp 98.3°F | Resp 16 | Wt 184.0 lb

## 2022-01-30 DIAGNOSIS — U071 COVID-19: Secondary | ICD-10-CM | POA: Diagnosis not present

## 2022-01-30 MED ORDER — METHYLPREDNISOLONE 4 MG PO TBPK: ORAL_TABLET | ORAL | 0 refills | Status: DC

## 2022-01-30 MED ORDER — CHERATUSSIN AC 100-10 MG/5ML PO SOLN: 5.0000 mL | Freq: Three times a day (TID) | ORAL | 0 refills | Status: DC | PRN

## 2022-01-30 NOTE — Progress Notes (Shared)
Subjective:   By signing my name below, I, Brandy Dunlap, attest that this documentation has been prepared under the direction and in the presence of Karie Chimera, NP 01/30/2022   Patient ID: Brandy Dunlap, female    DOB: 02/07/1951, 71 y.o.   MRN: [id number]  No chief complaint on file.   HPI Patient is in today for an office visit  Cough: She complains of an intermittent productive cough that appeared about a week ago. She describes the mucus as clear in color. She was previously seen for similar coughing symptoms by Dr. Harold Hedge and was given a breathing treatment which alleviated her symptoms. She states that as of today she feels sick. She has not completed a Covid test for her symptoms. She also notes of associated symptoms of a sore throat which also appeared last week. However, she states that symptoms are improving. She is currently taking Mucinex and Flonase.   Abdominal Pain: She complains of an "upset stomach." She has been using the restroom but has not been eating as much as she is used to. She is staying hydrated.   Health Maintenance Due  Topic Date Due   Hepatitis C Screening  Never done   Zoster Vaccines- Shingrix (1 of 2) Never done   DTaP/Tdap/Td (3 - Td or Tdap) 10/31/2020   COVID-19 Vaccine (4 - 2023-24 season) 10/27/2021   MAMMOGRAM  12/30/2021    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   Essential hypertension  10/08/2007   Qualifier: Diagnosis of  By: Arnoldo Morale MD, Balinda Quails    GERD (gastroesophageal reflux disease)    History of kidney stones    per 09-02-2018 pre-op eval , suspected kidney stone , to [facility] CT abd on 7-9- for further eval , passive   Hypertension    Infectious disease 05/23/2015   Low back pain    Osteoarthritis    Pneumonia    Pre-diabetes    Prediabetes 07/09/2016   Referred otalgia of left ear 03/26/2016   Sinus trouble     Past Surgical History:  Procedure Laterality Date   CHOLECYSTECTOMY     COLONOSCOPY     COLONOSCOPY W/ BIOPSIES  10/06/2003   left knee replacement Left 05/2021   LUMBAR FUSION  2008   LUMBAR FUSION     2020   PARTIAL HIP ARTHROPLASTY Right 2009    hip replacement   TONSILLECTOMY     TOTAL HIP ARTHROPLASTY Left 09/10/2018   Procedure: TOTAL HIP ARTHROPLASTY ANTERIOR APPROACH;  Surgeon: Gaynelle Arabian, MD;  [facility];  Service: Orthopedics;  Laterality: Left;   TOTAL KNEE ARTHROPLASTY Right 11/16/2013   Procedure: RIGHT TOTAL KNEE ARTHROPLASTY;  Surgeon: Vickey Huger, MD;  Location: [place];  Service: Orthopedics;  Laterality: Right;   VIDEO BRONCHOSCOPY Bilateral 12/05/2012   Procedure: VIDEO BRONCHOSCOPY WITHOUT FLUORO;  Surgeon: Herbie Baltimore  Agustina Caroli, MD;  [facility];  Service: Cardiopulmonary;  Laterality: Bilateral;    Family History  Problem Relation Age of Onset   COPD Mother    Heart disease Mother    Ovarian cancer Mother    Emphysema Mother    Hypertension Mother    Hyperlipidemia Mother    Heart Problems Mother    Stroke Mother    Thyroid disease Mother    Cancer Mother    COPD Father        died at age 81   Emphysema Father    Heart Problems Father    Heart disease Brother        stent with 90%   Breast cancer Maternal Aunt    Colon cancer Neg Hx    Esophageal cancer Neg Hx    Rectal cancer Neg Hx    Stomach cancer Neg Hx    Colon polyps Neg Hx     Social History   Socioeconomic History   Marital  status: Single    Spouse name: Not on file   Number of children: 1   Years of education: Not on file   Highest education level: Not on file  Occupational History   Occupation: Product manager: [employer]  Tobacco Use   Smoking status: Never    Passive exposure: Never   Smokeless tobacco: Never  Vaping Use   Vaping Use: Never used  Substance and Sexual Activity   Alcohol use: No   Drug use: No   Sexual activity: Not Currently    Partners: Male    Birth control/protection: Post-menopausal  Other Topics Concern   Not on file  Social History Narrative   Divorced   One daughter- lives locally and one grandson   Retired Pharmacist, hospital,  Has masters degree   Enjoys reading, spending time with her grandson   Social Determinants of Health   Financial Resource Strain: Low Risk  (04/11/2021)   Overall Financial Resource Strain (CARDIA)    Difficulty of Paying Living Expenses: Not hard at all  Food Insecurity: No Food Insecurity (04/11/2021)   Hunger Vital Sign    Worried About Running Out of Food in the Last Year: Never true    [place] in the Last Year: Never true  Transportation Needs: No Transportation Needs (04/11/2021)   PRAPARE - Hydrologist (Medical): No    Lack of Transportation (Non-Medical): No  Physical Activity: Insufficiently Active (04/11/2021)   Exercise Vital Sign    Days of Exercise per Week: 3 days    Minutes of Exercise per Session: 40 min  Stress: No Stress Concern Present (04/11/2021)   [place]    Feeling of Stress : Not at all  Social Connections: Moderately Isolated (04/11/2021)   Social Connection and Isolation Panel [NHANES]    Frequency of Communication with Friends and Family: More than three times a week    Frequency of Social Gatherings with Friends and Family: More than three times a week    Attends Religious Services: More than 4 times  per year    Active Member of [employer] or Organizations: No    Attends Archivist Meetings: Never    Marital Status: Never married  Intimate Partner Violence: Not At Risk (04/11/2021)   Humiliation, Afraid, Rape, and Kick questionnaire    Fear of Current or Ex-Partner: No    Emotionally Abused:  No    Physically Abused: No    Sexually Abused: No    Outpatient Medications Prior to Visit  Medication Sig Dispense Refill   methylPREDNISolone (MEDROL DOSEPAK) 4 MG TBPK tablet 6 day dose pack - take as directed 21 tablet 0   Albuterol Sulfate, sensor, (PROAIR DIGIHALER) 108 (90 Base) MCG/ACT AEPB Inhale 1 puff into the lungs daily.     amLODipine (NORVASC) 5 MG tablet TAKE 1 TABLET (5 MG TOTAL) BY MOUTH DAILY. 90 tablet 1   budesonide-formoterol (SYMBICORT) 160-4.5 MCG/ACT inhaler Inhale 2 puffs into the lungs 2 (two) times daily.     DEXILANT 60 MG capsule TAKE 1 CAPSULE BY MOUTH EVERY DAY 90 capsule 1   diclofenac sodium (VOLTAREN) 1 % GEL Apply 2 g topically 4 (four) times daily as needed. (Patient taking differently: Apply 2 g topically 4 (four) times daily as needed (L knee pain).) 100 g 3   EPINEPHrine 0.3 mg/0.3 mL IJ SOAJ injection Inject 0.3 mg into the muscle as needed for anaphylaxis.     meclizine (ANTIVERT) 12.5 MG tablet Take 1 tablet (12.5 mg total) by mouth 3 (three) times daily as needed for dizziness. 30 tablet 0   montelukast (SINGULAIR) 10 MG tablet Take 1 tablet (10 mg total) by mouth at bedtime. 30 tablet 3   omalizumab (XOLAIR) 150 MG injection Inject 150 mg into the skin every 14 (fourteen) days.     predniSONE (DELTASONE) 10 MG tablet 4 tabs by mouth once daily for 2 days, then 3 tabs daily x 2 days, then 2 tabs daily x 2 days, then 1 tab daily x 2 days 20 tablet 0   traMADol (ULTRAM) 50 MG tablet Take 50-100 mg by mouth every 6 (six) hours as needed for pain.     Zoster Vaccine Adjuvanted [facility]) injection Inject 0.5 mcg IM now and again in 2-6 months. 0.5 mL 1    No facility-administered medications prior to visit.    Allergies  Allergen Reactions   Augmentin [Amoxicillin-Pot Clavulanate] Diarrhea   Pantoprazole Sodium Palpitations    Review of Systems  Respiratory:  Positive for cough and sputum production (Clear).   Gastrointestinal:  Positive for abdominal pain.       Objective:    Physical Exam Constitutional:      General: She is not in acute distress.    Appearance: Normal appearance. She is not ill-appearing.  HENT:     Head: Normocephalic and atraumatic.     Right Ear: Tympanic membrane, ear canal and external ear normal.     Left Ear: Tympanic membrane, ear canal and external ear normal.  Eyes:     Extraocular Movements: Extraocular movements intact.     Pupils: Pupils are equal, round, and reactive to light.  Cardiovascular:     Rate and Rhythm: Normal rate and regular rhythm.     Heart sounds: Normal heart sounds. No murmur heard.    No gallop.  Pulmonary:     Effort: Pulmonary effort is normal. No respiratory distress.     Breath sounds: Normal breath sounds. No wheezing or rales.  Skin:    General: Skin is warm and dry.  Neurological:     Mental Status: She is alert and oriented to person, place, and time.  Psychiatric:        Mood and Affect: Mood normal.        Behavior: Behavior normal.        Judgment: Judgment normal.     There  were no vitals taken for this visit. Wt Readings from Last 3 Encounters:  11/22/21 183 lb (83 kg)  11/08/21 183 lb 8 oz (83.2 kg)  10/31/21 181 lb (82.1 kg)       Assessment & Plan:   Problem List Items Addressed This Visit   None  No orders of the defined types were placed in this encounter.   I, Brandy Dunlap, personally preformed the services described in this documentation.  All medical record entries made by the scribe were at my direction and in my presence.  I have reviewed the chart and discharge instructions (if applicable) and agree that the record reflects  my personal performance and is accurate and complete. 01/30/2022   I,Amber Collins,acting as a scribe for Nance Pear, NP.,have documented all relevant documentation on the behalf of Nance Pear, NP,as directed by  Nance Pear, NP while in the presence of Nance Pear, NP.    [employer]

## 2022-01-31 DIAGNOSIS — U071 COVID-19: Secondary | ICD-10-CM

## 2022-01-31 HISTORY — DX: COVID-19: U07.1

## 2022-01-31 LAB — POCT INFLUENZA A/B
Influenza A, POC: NEGATIVE
Influenza B, POC: NEGATIVE

## 2022-01-31 LAB — POC COVID19 BINAXNOW: SARS Coronavirus 2 Ag: POSITIVE — AB

## 2022-01-31 NOTE — Assessment & Plan Note (Signed)
Pt is outside of the window for antiviral rx.  Discussed supportive measures including mucinex, hydration, tylenol prn, rest. Advised of CDC guidelines for self isolation/ ending isolation.  Advised of safe practice guidelines. Symptom Tier reviewed.  Encouraged to monitor for any worsening symptoms; watch for increased shortness of breath, weakness, and signs of dehydration. Advised when to seek emergency care.  Instructed to rest and hydrate well.  Advised to leave the house during recommended isolation period, only if it is necessary to seek medical care. Pt verbalizes understanding.

## 2022-02-08 DIAGNOSIS — J4540 Moderate persistent asthma, uncomplicated: Secondary | ICD-10-CM | POA: Diagnosis not present

## 2022-02-28 DIAGNOSIS — J4540 Moderate persistent asthma, uncomplicated: Secondary | ICD-10-CM | POA: Diagnosis not present

## 2022-03-13 ENCOUNTER — Encounter: Payer: Self-pay | Admitting: Podiatry

## 2022-03-13 ENCOUNTER — Ambulatory Visit: Payer: Medicare PPO | Admitting: Podiatry

## 2022-03-13 VITALS — BP 154/76 | HR 82

## 2022-03-13 DIAGNOSIS — M778 Other enthesopathies, not elsewhere classified: Secondary | ICD-10-CM

## 2022-03-13 MED ORDER — DEXAMETHASONE SODIUM PHOSPHATE 120 MG/30ML IJ SOLN
2.0000 mg | Freq: Once | INTRAMUSCULAR | Status: AC
  Administered 2022-03-13: 2 mg via INTRA_ARTICULAR

## 2022-03-13 MED ORDER — MELOXICAM 15 MG PO TABS: 15.0000 mg | ORAL_TABLET | Freq: Every day | ORAL | 3 refills | Status: DC

## 2022-03-13 NOTE — Progress Notes (Signed)
She presents today for follow-up of her capsulitis left foot.  States that the shot helped but it did not get rid of the inflammation completely.  Now it seems like it is right and here she points to the second metatarsal phalangeal joint.  Objective: Vital signs are stable alert and oriented x 3.  Pulses are palpable.  There is no erythema edema salines drainage odor she does have pain on end range of motion of the second metatarsal phalangeal joint she does have some tenderness on palpation to the third interdigital space for the likely compensatory from the first and second metatarsal phalangeal joint capsulitis.  Assessment: Capsulitis second metatarsophalangeal joint lateral compensatory type neuroma symptomatology.  Plan: Injected dexamethasone around the second metatarsal phalangeal joint today did not inject directly into the joint.  She tolerated this procedure well and then we did start her on meloxicam.  We also discussed appropriate shoe gear.  Shoe gear to consist of hard soled shoes not very flexible with a good heel counter.  We did discuss with her that she would need to watch her blood pressure make sure that she is not exceeding normal limits she understands this and is amenable to it we will follow-up with me on an as-needed basis.

## 2022-03-16 DIAGNOSIS — J4540 Moderate persistent asthma, uncomplicated: Secondary | ICD-10-CM | POA: Diagnosis not present

## 2022-03-29 ENCOUNTER — Encounter: Payer: Self-pay | Admitting: Podiatry

## 2022-03-29 DIAGNOSIS — J4541 Moderate persistent asthma with (acute) exacerbation: Secondary | ICD-10-CM | POA: Diagnosis not present

## 2022-04-04 ENCOUNTER — Telehealth: Payer: Self-pay | Admitting: *Deleted

## 2022-04-04 MED ORDER — CELECOXIB 100 MG PO CAPS: 100.0000 mg | ORAL_CAPSULE | Freq: Two times a day (BID) | ORAL | 3 refills | Status: DC

## 2022-04-04 NOTE — Telephone Encounter (Signed)
Patient has been updated that medication (celebrex)has been sent to pharmacy on file.

## 2022-04-06 ENCOUNTER — Other Ambulatory Visit: Payer: Self-pay | Admitting: Family

## 2022-04-10 ENCOUNTER — Ambulatory Visit: Payer: Medicare PPO | Admitting: Family

## 2022-04-10 VITALS — BP 150/80 | HR 81 | Temp 98.2°F | Resp 16 | Wt 188.0 lb

## 2022-04-10 DIAGNOSIS — M545 Low back pain, unspecified: Secondary | ICD-10-CM

## 2022-04-10 DIAGNOSIS — I10 Essential (primary) hypertension: Secondary | ICD-10-CM

## 2022-04-10 DIAGNOSIS — J45901 Unspecified asthma with (acute) exacerbation: Secondary | ICD-10-CM

## 2022-04-10 LAB — POC URINALSYSI DIPSTICK (AUTOMATED)
Bilirubin, UA: NEGATIVE
Blood, UA: NEGATIVE
Glucose, UA: NEGATIVE
Ketones, UA: NEGATIVE
Nitrite, UA: NEGATIVE
Protein, UA: NEGATIVE
Spec Grav, UA: 1.02 (ref 1.010–1.025)
Urobilinogen, UA: 0.2 E.U./dL
pH, UA: 6.5 (ref 5.0–8.0)

## 2022-04-10 MED ORDER — PREDNISONE 10 MG PO TABS: ORAL_TABLET | ORAL | 0 refills | Status: DC

## 2022-04-10 MED ORDER — CEFDINIR 300 MG PO CAPS: 300.0000 mg | ORAL_CAPSULE | Freq: Two times a day (BID) | ORAL | 0 refills | Status: DC

## 2022-04-10 MED ORDER — AMLODIPINE BESYLATE 5 MG PO TABS: 7.5000 mg | ORAL_TABLET | Freq: Every day | ORAL | 1 refills | Status: DC

## 2022-04-10 NOTE — Assessment & Plan Note (Signed)
Continue symbicort, albuterol. Rx with prednisone. She is having sinus pain/pressure as well as flank pain and urinary frequency.  Rx with cefdinir (intolerant to augmentin).

## 2022-04-10 NOTE — Progress Notes (Addendum)
Subjective:   By signing my name below, I, Shehryar Baig, attest that this documentation has been prepared under the direction and in the presence of [name], NP. 04/10/2022   Patient ID: Brandy Dunlap, female    DOB: 09/19/1950, 72 y.o.   MRN: [id number]  Chief Complaint  Patient presents with   Cough    Complains of persistent cough for about 2 weeks   Back Pain    Complains of back pain, "feels like a kidney infection"     Patient is in today for a office visit.   Cough: She complains of cough and congestion for the past 2 weeks. Her symptoms started with cough and she's taken her Symbicort inhaler and OTC medication but found no relief. Her cough is worse at night. She tested negative for Covid-19 last week. Reports + sinus pain and pressure.  Back pain: She complains of right lower back pain since this morning. Has had similar symptoms in the past with UTI.She has urinary frequency but denies having any dysuria.   Blood pressure: Her blood pressure is elevated during this visit. She continues taking 5 mg amlodipine daily PO and reports no new issues while taking it.  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   Essential hypertension 10/08/2007    Qualifier: Diagnosis of  By: Arnoldo Morale MD, Balinda Quails    GERD (gastroesophageal reflux disease)    History of kidney stones    per 09-02-2018 pre-op eval , suspected kidney stone , to [facility] abd on 7-9- for further eval , passive   Hypertension    Infectious disease 05/23/2015   Low back pain    Osteoarthritis    Pneumonia    Pre-diabetes    Prediabetes 07/09/2016   Referred otalgia of left ear 03/26/2016   Sinus trouble     Past Surgical History:  Procedure Laterality Date   CHOLECYSTECTOMY     COLONOSCOPY     COLONOSCOPY W/ BIOPSIES  10/06/2003   left knee replacement Left 05/2021   LUMBAR FUSION  2008   LUMBAR FUSION     2020   PARTIAL HIP ARTHROPLASTY Right 2009    hip replacement   TONSILLECTOMY     TOTAL HIP ARTHROPLASTY Left 09/10/2018   Procedure: TOTAL HIP ARTHROPLASTY ANTERIOR APPROACH;  Surgeon: Gaynelle Arabian, MD;  Location: [facility];  Service: Orthopedics;  Laterality: Left;   TOTAL KNEE ARTHROPLASTY Right 11/16/2013   Procedure: RIGHT TOTAL KNEE ARTHROPLASTY;  Surgeon: Vickey Huger, MD;  Location: [place];  Service: Orthopedics;  Laterality: Right;   VIDEO BRONCHOSCOPY Bilateral 12/05/2012   Procedure: VIDEO BRONCHOSCOPY WITHOUT FLUORO;  Surgeon:  Collene Gobble, MD;  Location: [facility];  Service: Cardiopulmonary;  Laterality: Bilateral;    Family History  Problem Relation Age of Onset   COPD Mother    Heart disease Mother    Ovarian cancer Mother    Emphysema Mother    Hypertension Mother    Hyperlipidemia Mother    Heart Problems Mother    Stroke Mother    Thyroid disease Mother    Cancer Mother    COPD Father        died at age 44   Emphysema Father    Heart Problems Father    Heart disease Brother        stent with 90%   Breast cancer Maternal Aunt    Colon cancer Neg Hx    Esophageal cancer Neg Hx    Rectal cancer Neg Hx    Stomach cancer Neg Hx    Colon polyps Neg Hx     Social History   Socioeconomic History   Marital status: Single     Spouse name: Not on file   Number of children: 1   Years of education: Not on file   Highest education level: Not on file  Occupational History   Occupation: Product manager: [employer]  Tobacco Use   Smoking status: Never    Passive exposure: Never   Smokeless tobacco: Never  Vaping Use   Vaping Use: Never used  Substance and Sexual Activity   Alcohol use: No   Drug use: No   Sexual activity: Not Currently    Partners: Male    Birth control/protection: Post-menopausal  Other Topics Concern   Not on file  Social History Narrative   Divorced   One daughter- lives locally and one grandson   Retired Pharmacist, hospital,  Has masters degree   Enjoys reading, spending time with her grandson   Social Determinants of Health   Financial Resource Strain: Low Risk  (04/11/2021)   Overall Financial Resource Strain (CARDIA)    Difficulty of Paying Living Expenses: Not hard at all  Food Insecurity: No Food Insecurity (04/11/2021)   Hunger Vital Sign    Worried About Running Out of Food in the Last Year: Never true    [place] in the Last Year: Never true  Transportation Needs: No Transportation Needs (04/11/2021)   PRAPARE - Hydrologist (Medical): No    Lack of Transportation (Non-Medical): No  Physical Activity: Insufficiently Active (04/11/2021)   Exercise Vital Sign    Days of Exercise per Week: 3 days    Minutes of Exercise per Session: 40 min  Stress: No Stress Concern Present (04/11/2021)   [place]    Feeling of Stress : Not at all  Social Connections: Moderately Isolated (04/11/2021)   Social Connection and Isolation Panel [NHANES]    Frequency of Communication with Friends and Family: More than three times a week    Frequency of Social Gatherings with Friends and Family: More than three times a week    Attends Religious Services: More than 4 times per year    Active  Member of [employer] or Organizations: No    Attends Archivist Meetings: Never    Marital Status: Never married  Intimate Partner Violence: Not At Risk (04/11/2021)   Humiliation, Afraid, Rape, and Kick questionnaire    Fear of Current or Ex-Partner: No    Emotionally  Abused: No    Physically Abused: No    Sexually Abused: No    Outpatient Medications Prior to Visit  Medication Sig Dispense Refill   budesonide-formoterol (SYMBICORT) 160-4.5 MCG/ACT inhaler Inhale 2 puffs into the lungs 2 (two) times daily.     celecoxib (CELEBREX) 100 MG capsule Take 1 capsule (100 mg total) by mouth 2 (two) times daily. 60 capsule 3   DEXILANT 60 MG capsule TAKE 1 CAPSULE BY MOUTH EVERY DAY 90 capsule 1   diclofenac sodium (VOLTAREN) 1 % GEL Apply 2 g topically 4 (four) times daily as needed. (Patient taking differently: Apply 2 g topically 4 (four) times daily as needed (L knee pain).) 100 g 3   EPINEPHrine 0.3 mg/0.3 mL IJ SOAJ injection Inject 0.3 mg into the muscle as needed for anaphylaxis.     meclizine (ANTIVERT) 12.5 MG tablet Take 1 tablet (12.5 mg total) by mouth 3 (three) times daily as needed for dizziness. 30 tablet 0   montelukast (SINGULAIR) 10 MG tablet Take 1 tablet (10 mg total) by mouth at bedtime. 30 tablet 3   omalizumab (XOLAIR) 150 MG injection Inject 150 mg into the skin every 14 (fourteen) days.     traMADol (ULTRAM) 50 MG tablet Take 50-100 mg by mouth every 6 (six) hours as needed for pain.     amLODipine (NORVASC) 5 MG tablet TAKE 1 TABLET (5 MG TOTAL) BY MOUTH DAILY. 90 tablet 1   guaiFENesin-codeine (CHERATUSSIN AC) 100-10 MG/5ML syrup Take 5 mLs by mouth 3 (three) times daily as needed for cough. 75 mL 0   meloxicam (MOBIC) 15 MG tablet Take 1 tablet (15 mg total) by mouth daily. 90 tablet 3   No facility-administered medications prior to visit.    Allergies  Allergen Reactions   Augmentin [Amoxicillin-Pot Clavulanate] Diarrhea   Pantoprazole Sodium Palpitations     Review of Systems  HENT:  Positive for congestion.   Respiratory:  Positive for cough.   Musculoskeletal:  Positive for back pain (lower right back pain).       Objective:    Physical Exam Constitutional:      General: She is not in acute distress.    Appearance: Normal appearance. She is not ill-appearing.  HENT:     Head: Normocephalic and atraumatic.     Right Ear: External ear normal.     Left Ear: External ear normal.  Eyes:     Extraocular Movements: Extraocular movements intact.     Pupils: Pupils are equal, round, and reactive to light.  Cardiovascular:     Rate and Rhythm: Normal rate and regular rhythm.     Heart sounds: Normal heart sounds. No murmur heard.    No gallop.     Comments: Blood pressure measured 150/84 during manual recheck Pulmonary:     Effort: Pulmonary effort is normal. No respiratory distress.     Breath sounds: Wheezing (right sided) present. No rales.  Skin:    General: Skin is warm and dry.  Neurological:     Mental Status: She is alert and oriented to person, place, and time.  Psychiatric:        Judgment: Judgment normal.     BP (!) 150/80   Pulse 81   Temp 98.2 F (36.8 C) (Oral)   Resp 16   Wt 188 lb (85.3 kg)   SpO2 96%   BMI 33.30 kg/m  Wt Readings from Last 3 Encounters:  04/10/22 188 lb (85.3 kg)  01/30/22 184  lb (83.5 kg)  11/22/21 183 lb (83 kg)       Assessment & Plan:  Low back pain without sciatica, unspecified back pain laterality, unspecified chronicity Assessment & Plan: UA trace leuks, will send urine for culture. Begin cefdinir for coverage of possible bronchitis/sinusitis and UTI.   Orders: -     POCT Urinalysis Dipstick (Automated) -     Urine Culture  Essential hypertension Assessment & Plan: >>ASSESSMENT AND PLAN FOR ESSENTIAL HYPERTENSION WRITTEN ON 04/10/2022 10:25 AM BY O'SULLIVAN, Alter Moss, NP  BP Readings from Last 3 Encounters:  04/10/22 (!) 163/64  03/13/22 (!) 154/76  01/30/22 (!)  132/56     >>ASSESSMENT AND PLAN FOR ESSENTIAL (PRIMARY) HYPERTENSION WRITTEN ON 04/10/2022 10:29 AM BY O'SULLIVAN, Adynn Caseres, NP  BP above goal. Will increase amlodipine from 5 to 7.44m once daily.    Asthma with acute exacerbation, unspecified asthma severity, unspecified whether persistent Assessment & Plan: Continue symbicort, albuterol. Rx with prednisone. She is having sinus pain/pressure as well as flank pain and urinary frequency.  Rx with cefdinir (intolerant to augmentin).    Essential (primary) hypertension  Other orders -     predniSONE; 4 tabs by mouth once daily for 2 days, then 3 tabs daily x 2 days, then 2 tabs daily x 2 days, then 1 tab daily x 2 days  Dispense: 20 tablet; Refill: 0 -     Cefdinir; Take 1 capsule (300 mg total) by mouth 2 (two) times daily.  Dispense: 20 capsule; Refill: 0 -     amLODIPine Besylate; Take 1.5 tablets (7.5 mg total) by mouth daily.  Dispense: 90 tablet; Refill: 1    I, MNance Pear NP, personally preformed the services described in this documentation.  All medical record entries made by the scribe were at my direction and in my presence.  I have reviewed the chart and discharge instructions (if applicable) and agree that the record reflects my personal performance and is accurate and complete. 04/10/2022   I,Shehryar Baig,acting as a scribe for MNance Pear NP.,have documented all relevant documentation on the behalf of MNance Pear NP,as directed by  MNance Pear NP while in the presence of MNance Pear NP.   MNance Pear NP

## 2022-04-10 NOTE — Assessment & Plan Note (Signed)
BP above goal. Will increase amlodipine from 5 to 7.55m once daily.

## 2022-04-10 NOTE — Assessment & Plan Note (Signed)
>>  ASSESSMENT AND PLAN FOR ASTHMA WRITTEN ON 04/10/2022 10:27 AM BY O'SULLIVAN, Vickye Astorino, NP  Continue symbicort , albuterol . Rx with prednisone . She is having sinus pain/pressure as well as flank pain and urinary frequency.  Rx with cefdinir  (intolerant to augmentin ).

## 2022-04-10 NOTE — Assessment & Plan Note (Signed)
UA trace leuks, will send urine for culture. Begin cefdinir for coverage of possible bronchitis/sinusitis and UTI.

## 2022-04-10 NOTE — Assessment & Plan Note (Addendum)
>>  ASSESSMENT AND PLAN FOR ESSENTIAL HYPERTENSION WRITTEN ON 04/10/2022 10:25 AM BY O'SULLIVAN, Soren Pigman, NP  BP Readings from Last 3 Encounters:  04/10/22 (!) 163/64  03/13/22 (!) 154/76  01/30/22 (!) 132/56     >>ASSESSMENT AND PLAN FOR ESSENTIAL (PRIMARY) HYPERTENSION WRITTEN ON 04/10/2022 10:29 AM BY O'SULLIVAN, Annaliza Zia, NP  BP above goal. Will increase amlodipine from 5 to 7.'5mg'$  once daily.

## 2022-04-11 ENCOUNTER — Other Ambulatory Visit: Payer: Self-pay | Admitting: Family

## 2022-04-11 DIAGNOSIS — Z1231 Encounter for screening mammogram for malignant neoplasm of breast: Secondary | ICD-10-CM

## 2022-04-12 ENCOUNTER — Ambulatory Visit (INDEPENDENT_AMBULATORY_CARE_PROVIDER_SITE_OTHER): Payer: Medicare PPO | Admitting: *Deleted

## 2022-04-12 DIAGNOSIS — Z0000 Encounter for general adult medical examination without abnormal findings: Secondary | ICD-10-CM | POA: Diagnosis not present

## 2022-04-12 DIAGNOSIS — J4540 Moderate persistent asthma, uncomplicated: Secondary | ICD-10-CM | POA: Diagnosis not present

## 2022-04-12 NOTE — Progress Notes (Signed)
Subjective:   Brandy Dunlap is a 72 y.o. female who presents for Medicare Annual (Subsequent) preventive examination.  I connected with  Audrene Metzel Holzheimer on 04/12/22 by a audio enabled telemedicine application and verified that I am speaking with the correct person using two identifiers.  Patient Location: Home  Provider Location: Office/Clinic  I discussed the limitations of evaluation and management by telemedicine. The patient expressed understanding and agreed to proceed.   Review of Systems    Defer to PCP Cardiac Risk Factors include: advanced age (>42mn, >>61women);dyslipidemia;hypertension     Objective:    There were no vitals filed for this visit. There is no height or weight on file to calculate BMI.     04/12/2022    2:23 PM 11/23/2021    1:22 PM 06/07/2021    2:01 PM 05/15/2021    3:42 PM 04/11/2021    1:01 PM 09/17/2019    8:00 AM 09/14/2019   11:30 AM  Advanced Directives  Does Patient Have a Medical Advance Directive? No No No No Yes No No  Does patient want to make changes to medical advance directive?     Yes (MAU/Ambulatory/Procedural Areas - Information given)    Would patient like information on creating a medical advance directive? No - Patient declined Yes (MAU/Ambulatory/Procedural Areas - Information given) No - Patient declined Yes (MAU/Ambulatory/Procedural Areas - Information given)  No - Patient declined     Current Medications (verified) Outpatient Encounter Medications as of 04/12/2022  Medication Sig   amLODipine (NORVASC) 5 MG tablet Take 1.5 tablets (7.5 mg total) by mouth daily.   budesonide-formoterol (SYMBICORT) 160-4.5 MCG/ACT inhaler Inhale 2 puffs into the lungs 2 (two) times daily.   cefdinir (OMNICEF) 300 MG capsule Take 1 capsule (300 mg total) by mouth 2 (two) times daily.   celecoxib (CELEBREX) 100 MG capsule Take 1 capsule (100 mg total) by mouth 2 (two) times daily.   DEXILANT 60 MG capsule TAKE 1 CAPSULE BY MOUTH EVERY DAY    diclofenac sodium (VOLTAREN) 1 % GEL Apply 2 g topically 4 (four) times daily as needed. (Patient taking differently: Apply 2 g topically 4 (four) times daily as needed (L knee pain).)   EPINEPHrine 0.3 mg/0.3 mL IJ SOAJ injection Inject 0.3 mg into the muscle as needed for anaphylaxis.   meclizine (ANTIVERT) 12.5 MG tablet Take 1 tablet (12.5 mg total) by mouth 3 (three) times daily as needed for dizziness.   montelukast (SINGULAIR) 10 MG tablet Take 1 tablet (10 mg total) by mouth at bedtime.   omalizumab (XOLAIR) 150 MG injection Inject 150 mg into the skin every 14 (fourteen) days.   predniSONE (DELTASONE) 10 MG tablet 4 tabs by mouth once daily for 2 days, then 3 tabs daily x 2 days, then 2 tabs daily x 2 days, then 1 tab daily x 2 days   traMADol (ULTRAM) 50 MG tablet Take 50-100 mg by mouth every 6 (six) hours as needed for pain.   No facility-administered encounter medications on file as of 04/12/2022.    Allergies (verified) Augmentin [amoxicillin-pot clavulanate] and Pantoprazole sodium   History: Past Medical History:  Diagnosis Date   Acute pain of left knee 10/02/2019   Allergic rhinitis 06/29/2009   Qualifier: Diagnosis of  By: TJoyce Gross    Allergic rhinitis due to animal (cat) (dog) hair and dander 07/07/2020   Allergy    Asthma    Atypical chest pain 04/18/2017   B12 deficiency  Back pain    Body mass index (BMI) 35.0-35.9, adult 05/01/2019   Bronchitis    hx   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   Chronic neck pain 03/01/2015   Colon polyp 10/06/2003   Depressive disorder 10/01/2016   PT DENIES   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   Essential hypertension 10/08/2007   Qualifier: Diagnosis of  By: Arnoldo Morale MD, Balinda Quails    GERD (gastroesophageal reflux disease)    History of kidney stones    per 09-02-2018 pre-op eval , suspected kidney stone , to [facility] CT abd on 7-9- for further eval , passive   Hypertension    Infectious disease 05/23/2015   Low  back pain    Osteoarthritis    Pneumonia    Pre-diabetes    Prediabetes 07/09/2016   Referred otalgia of left ear 03/26/2016   Sinus trouble    Past Surgical History:  Procedure Laterality Date   CHOLECYSTECTOMY     COLONOSCOPY     COLONOSCOPY W/ BIOPSIES  10/06/2003   left knee replacement Left 05/2021   LUMBAR FUSION  2008   LUMBAR FUSION     2020   PARTIAL HIP ARTHROPLASTY Right 2009    hip replacement   TONSILLECTOMY     TOTAL HIP ARTHROPLASTY Left 09/10/2018   Procedure: TOTAL HIP ARTHROPLASTY ANTERIOR APPROACH;  Surgeon: Gaynelle Arabian, MD;  Location: [facility];  Service: Orthopedics;  Laterality: Left;   TOTAL KNEE ARTHROPLASTY Right 11/16/2013   Procedure: RIGHT TOTAL KNEE ARTHROPLASTY;  Surgeon: Vickey Huger, MD;  Location: [facility];  Service: Orthopedics;  Laterality: Right;   VIDEO BRONCHOSCOPY Bilateral 12/05/2012   Procedure: VIDEO BRONCHOSCOPY WITHOUT FLUORO;  Surgeon: Collene Gobble, MD;  Location: [facility];  Service: Cardiopulmonary;  Laterality: Bilateral;   Family History  Problem Relation Age of Onset   COPD Mother    Heart disease Mother    Ovarian cancer Mother    Emphysema Mother    Hypertension Mother    Hyperlipidemia Mother    Heart Problems Mother    Stroke Mother    Thyroid disease Mother    Cancer Mother    COPD Father        died at age 34   Emphysema Father    Heart Problems Father    Heart disease Brother        stent with 90%   Breast cancer Maternal Aunt    Colon cancer Neg Hx    Esophageal cancer Neg Hx    Rectal cancer Neg Hx    Stomach cancer Neg Hx    Colon polyps Neg Hx    Social History   Socioeconomic History   Marital status: Single    Spouse name: Not on file   Number of children: 1   Years of education: Not on file   Highest education level: Not on file  Occupational History   Occupation: Product manager: [employer]  Tobacco Use   Smoking status: Never    Passive exposure: Never   Smokeless  tobacco: Never  Vaping Use   Vaping Use: Never used  Substance and Sexual Activity   Alcohol use: No   Drug use: No   Sexual activity: Not Currently    Partners: Male    Birth control/protection: Post-menopausal  Other Topics Concern   Not on file  Social History Narrative   Divorced   One daughter- lives locally and one grandson   Retired Pharmacist, hospital,  Has masters  degree   Enjoys reading, spending time with her grandson   Social Determinants of Health   Financial Resource Strain: Low Risk  (04/11/2021)   Overall Financial Resource Strain (CARDIA)    Difficulty of Paying Living Expenses: Not hard at all  Food Insecurity: No Food Insecurity (04/12/2022)   Hunger Vital Sign    Worried About Running Out of Food in the Last Year: Never true    Ran Out of Food in the Last Year: Never true  Transportation Needs: No Transportation Needs (04/12/2022)   PRAPARE - Hydrologist (Medical): No    Lack of Transportation (Non-Medical): No  Physical Activity: Insufficiently Active (04/11/2021)   Exercise Vital Sign    Days of Exercise per Week: 3 days    Minutes of Exercise per Session: 40 min  Stress: No Stress Concern Present (04/11/2021)   [place]    Feeling of Stress : Not at all  Social Connections: Moderately Isolated (04/11/2021)   Social Connection and Isolation Panel [NHANES]    Frequency of Communication with Friends and Family: More than three times a week    Frequency of Social Gatherings with Friends and Family: More than three times a week    Attends Religious Services: More than 4 times per year    Active Member of [employer] or Organizations: No    Attends Music therapist: Never    Marital Status: Never married    Tobacco Counseling Counseling given: Not Answered   Clinical Intake:  Pre-visit preparation completed: Yes  Pain : No/denies pain  Diabetes: No  How  often do you need to have someone help you when you read instructions, pamphlets, or other written materials from your doctor or pharmacy?: 1 - Never  Activities of Daily Living    04/12/2022    2:24 PM  In your present state of health, do you have any difficulty performing the following activities:  Hearing? 1  Comment wears hearing aid in right ear  Vision? 1  Difficulty concentrating or making decisions? 0  Walking or climbing stairs? 0  Dressing or bathing? 0  Doing errands, shopping? 0  Preparing Food and eating ? N  Using the Toilet? N  In the past six months, have you accidently leaked urine? Y  Comment occasionally  Do you have problems with loss of bowel control? N  Managing your Medications? N  Managing your Finances? N  Housekeeping or managing your Housekeeping? N    Patient Care Team: [name], NP as PCP - General (Internal Medicine)  Indicate any recent Medical Services you may have received from other than [facility] providers in the past year (date may be approximate).     Assessment:   This is a routine wellness examination for Tascha.  Hearing/Vision screen No results found.  Dietary issues and exercise activities discussed: Current Exercise Habits: Home exercise routine, Type of exercise: yoga;calisthenics, Time (Minutes): 45, Frequency (Times/Week): 4, Weekly Exercise (Minutes/Week): 180, Intensity: Mild, Exercise limited by: None identified   Goals Addressed   None    Depression Screen    04/12/2022    2:26 PM 11/08/2021    2:56 PM 04/11/2021    1:00 PM 01/24/2021    9:04 AM 12/20/2020    2:08 PM 05/16/2020   10:16 AM 04/23/2019   10:14 AM  PHQ 2/9 Scores  PHQ - 2 Score 0 0 0 0 0 0 0  PHQ- 9  Score     0 3     Fall Risk    04/12/2022    2:24 PM 11/08/2021    2:56 PM 04/11/2021    1:02 PM 01/24/2021    9:04 AM 12/20/2020    2:08 PM  Fall Risk   Falls in the past year? 0 0 0 0 0  Number falls in past yr: 0 0 0 0 0  Injury with Fall? 0 0  0 0 0  Risk for fall due to : No Fall Risks  Impaired vision    Follow up Falls evaluation completed Falls evaluation completed Falls prevention discussed  Falls evaluation completed    FALL RISK PREVENTION PERTAINING TO THE HOME:  Any stairs in or around the home? Yes  If so, are there any without handrails? Yes  Home free of loose throw rugs in walkways, pet beds, electrical cords, etc? Yes  Adequate lighting in your home to reduce risk of falls? Yes   ASSISTIVE DEVICES UTILIZED TO PREVENT FALLS:  Life alert? No  Use of a cane, walker or w/c? No  Grab bars in the bathroom? Yes  Shower chair or bench in shower? Yes  Elevated toilet seat or a handicapped toilet? No   TIMED UP AND GO:  Was the test performed?  No, audio visit .   Cognitive Function:        04/12/2022    2:30 PM 04/11/2021    1:06 PM  6CIT Screen  What Year? 0 points 0 points  What month? 0 points 0 points  What time? 0 points 0 points  Count back from 20 0 points 0 points  Months in reverse 0 points 0 points  Repeat phrase 0 points 2 points  Total Score 0 points 2 points    Immunizations Immunization History  Administered Date(s) Administered   Fluad Quad(high Dose 65+) 12/19/2018, 12/31/2019, 01/24/2021, 11/29/2021   Influenza Split 11/27/2011, 11/26/2012   Influenza, High Dose Seasonal PF 04/07/2021   PFIZER(Purple Top)SARS-COV-2 Vaccination 03/18/2019, 04/14/2019, 12/05/2019   PPD Test 11/16/2016   Pneumococcal Conjugate-13 08/28/2017   Pneumococcal Polysaccharide-23 10/25/2011, 01/24/2021   Td 09/06/2008   Tdap 11/01/2010   Zoster, Live 10/03/2011    TDAP status: Due, Education has been provided regarding the importance of this vaccine. Advised may receive this vaccine at local pharmacy or Health Dept. Aware to provide a copy of the vaccination record if obtained from local pharmacy or Health Dept. Verbalized acceptance and understanding.  Flu Vaccine status: Up to date  Pneumococcal  vaccine status: Up to date  Covid-19 vaccine status: Information provided on how to obtain vaccines.   Qualifies for Shingles Vaccine? Yes   Zostavax completed Yes   Shingrix Completed?: No.    Education has been provided regarding the importance of this vaccine. Patient has been advised to call insurance company to determine out of pocket expense if they have not yet received this vaccine. Advised may also receive vaccine at local pharmacy or Health Dept. Verbalized acceptance and understanding.  Screening Tests Health Maintenance  Topic Date Due   Hepatitis C Screening  Never done   Zoster Vaccines- Shingrix (1 of 2) Never done   DTaP/Tdap/Td (3 - Td or Tdap) 10/31/2020   COVID-19 Vaccine (4 - 2023-24 season) 10/27/2021   MAMMOGRAM  12/30/2021   Medicare Annual Wellness (AWV)  04/11/2022   Pneumonia Vaccine 77+ Years old  Completed   INFLUENZA VACCINE  Completed   DEXA SCAN  Completed  HPV VACCINES  Aged Out   COLONOSCOPY (Pts 45-95yr Insurance coverage will need to be confirmed)  Discontinued    Health Maintenance  Health Maintenance Due  Topic Date Due   Hepatitis C Screening  Never done   Zoster Vaccines- Shingrix (1 of 2) Never done   DTaP/Tdap/Td (3 - Td or Tdap) 10/31/2020   COVID-19 Vaccine (4 - 2023-24 season) 10/27/2021   MAMMOGRAM  12/30/2021   Medicare Annual Wellness (AWV)  04/11/2022    Colorectal cancer screening: Type of screening: Colonoscopy. Completed 08/30/20. Repeat every N/a years  Mammogram status: Completed 12/30/20. Repeat every year scheduled for 05/25/22  Bone Density status: Completed 07/08/19. Results reflect: Bone density results: NORMAL. Repeat every 2 years.  Lung Cancer Screening: (Low Dose CT Chest recommended if Age 72-80years, 30 pack-year currently smoking OR have quit w/in 15years.) does not qualify.   Additional Screening:  Hepatitis C Screening: does qualify; Completed N/a  Vision Screening: Recommended annual ophthalmology exams  for early detection of glaucoma and other disorders of the eye. Is the patient up to date with their annual eye exam?  Yes  Who is the provider or what is the name of the office in which the patient attends annual eye exams? [facility] If pt is not established with a provider, would they like to be referred to a provider to establish care? No .   Dental Screening: Recommended annual dental exams for proper oral hygiene  Community Resource Referral / Chronic Care Management: CRR required this visit?  No   CCM required this visit?  No      Plan:     I have personally reviewed and noted the following in the patient's chart:   Medical and social history Use of alcohol, tobacco or illicit drugs  Current medications and supplements including opioid prescriptions. Patient is currently taking opioid prescriptions. Information provided to patient regarding non-opioid alternatives. Patient advised to discuss non-opioid treatment plan with their provider. Functional ability and status Nutritional status Physical activity Advanced directives List of other physicians Hospitalizations, surgeries, and ER visits in previous 12 months Vitals Screenings to include cognitive, depression, and falls Referrals and appointments  In addition, I have reviewed and discussed with patient certain preventive protocols, quality metrics, and best practice recommendations. A written personalized care plan for preventive services as well as general preventive health recommendations were provided to patient.   Due to this being a telephonic visit, the after visit summary with patients personalized plan was offered to patient via mail or my-chart. Patient would like to access on my-chart.  BBeatris Ship [place]  04/12/2022   Nurse Notes: None

## 2022-04-12 NOTE — Patient Instructions (Signed)
Brandy Dunlap , Thank you for taking time to come for your Medicare Wellness Visit. I appreciate your ongoing commitment to your health goals. Please review the following plan we discussed and let me know if I can assist you in the future.   These are the goals we discussed:  Goals      Maintain active lifestyle.     Patient Stated     None at this time         This is a list of the screening recommended for you and due dates:  Health Maintenance  Topic Date Due   Hepatitis C Screening: USPSTF Recommendation to screen - Ages 34-79 yo.  Never done   Zoster (Shingles) Vaccine (1 of 2) Never done   DTaP/Tdap/Td vaccine (3 - Td or Tdap) 10/31/2020   COVID-19 Vaccine (4 - 2023-24 season) 10/27/2021   Mammogram  12/30/2021   Medicare Annual Wellness Visit  04/13/2023   Pneumonia Vaccine  Completed   Flu Shot  Completed   DEXA scan (bone density measurement)  Completed   HPV Vaccine  Aged Out   Colon Cancer Screening  Discontinued     Next appointment: Follow up in one year for your annual wellness visit.   Preventive Care 29 Years and Older, Female Preventive care refers to lifestyle choices and visits with your health care provider that can promote health and wellness. What does preventive care include? A yearly physical exam. This is also called an annual well check. Dental exams once or twice a year. Routine eye exams. Ask your health care provider how often you should have your eyes checked. Personal lifestyle choices, including: Daily care of your teeth and gums. Regular physical activity. Eating a healthy diet. Avoiding tobacco and drug use. Limiting alcohol use. Practicing safe sex. Taking low-dose aspirin every day. Taking vitamin and mineral supplements as recommended by your health care provider. What happens during an annual well check? The services and screenings done by your health care provider during your annual well check will depend on your age, overall  health, lifestyle risk factors, and family history of disease. Counseling  Your health care provider may ask you questions about your: Alcohol use. Tobacco use. Drug use. Emotional well-being. Home and relationship well-being. Sexual activity. Eating habits. History of falls. Memory and ability to understand (cognition). Work and work Statistician. Reproductive health. Screening  You may have the following tests or measurements: Height, weight, and BMI. Blood pressure. Lipid and cholesterol levels. These may be checked every 5 years, or more frequently if you are over 101 years old. Skin check. Lung cancer screening. You may have this screening every year starting at age 7 if you have a 30-pack-year history of smoking and currently smoke or have quit within the past 15 years. Fecal occult blood test (FOBT) of the stool. You may have this test every year starting at age 40. Flexible sigmoidoscopy or colonoscopy. You may have a sigmoidoscopy every 5 years or a colonoscopy every 10 years starting at age 3. Hepatitis C blood test. Hepatitis B blood test. Sexually transmitted disease (STD) testing. Diabetes screening. This is done by checking your blood sugar (glucose) after you have not eaten for a while (fasting). You may have this done every 1-3 years. Bone density scan. This is done to screen for osteoporosis. You may have this done starting at age 37. Mammogram. This may be done every 1-2 years. Talk to your health care provider about how often you should have  regular mammograms. Talk with your health care provider about your test results, treatment options, and if necessary, the need for more tests. Vaccines  Your health care provider may recommend certain vaccines, such as: Influenza vaccine. This is recommended every year. Tetanus, diphtheria, and acellular pertussis (Tdap, Td) vaccine. You may need a Td booster every 10 years. Zoster vaccine. You may need this after age  62. Pneumococcal 13-valent conjugate (PCV13) vaccine. One dose is recommended after age 32. Pneumococcal polysaccharide (PPSV23) vaccine. One dose is recommended after age 78. Talk to your health care provider about which screenings and vaccines you need and how often you need them. This information is not intended to replace advice given to you by your health care provider. Make sure you discuss any questions you have with your health care provider. Document Released: 03/11/2015 Document Revised: 11/02/2015 Document Reviewed: 12/14/2014 Elsevier Interactive Patient Education  2017 [place] Prevention in the Home Falls can cause injuries. They can happen to people of all ages. There are many things you can do to make your home safe and to help prevent falls. What can I do on the outside of my home? Regularly fix the edges of walkways and driveways and fix any cracks. Remove anything that might make you trip as you walk through a door, such as a raised step or threshold. Trim any bushes or trees on the path to your home. Use bright outdoor lighting. Clear any walking paths of anything that might make someone trip, such as rocks or tools. Regularly check to see if handrails are loose or broken. Make sure that both sides of any steps have handrails. Any raised decks and porches should have guardrails on the edges. Have any leaves, snow, or ice cleared regularly. Use sand or salt on walking paths during winter. Clean up any spills in your garage right away. This includes oil or grease spills. What can I do in the bathroom? Use night lights. Install grab bars by the toilet and in the tub and shower. Do not use towel bars as grab bars. Use non-skid mats or decals in the tub or shower. If you need to sit down in the shower, use a plastic, non-slip stool. Keep the floor dry. Clean up any water that spills on the floor as soon as it happens. Remove soap buildup in the tub or shower  regularly. Attach bath mats securely with double-sided non-slip rug tape. Do not have throw rugs and other things on the floor that can make you trip. What can I do in the bedroom? Use night lights. Make sure that you have a light by your bed that is easy to reach. Do not use any sheets or blankets that are too big for your bed. They should not hang down onto the floor. Have a firm chair that has side arms. You can use this for support while you get dressed. Do not have throw rugs and other things on the floor that can make you trip. What can I do in the kitchen? Clean up any spills right away. Avoid walking on wet floors. Keep items that you use a lot in easy-to-reach places. If you need to reach something above you, use a strong step stool that has a grab bar. Keep electrical cords out of the way. Do not use floor polish or wax that makes floors slippery. If you must use wax, use non-skid floor wax. Do not have throw rugs and other things on the floor that  can make you trip. What can I do with my stairs? Do not leave any items on the stairs. Make sure that there are handrails on both sides of the stairs and use them. Fix handrails that are broken or loose. Make sure that handrails are as long as the stairways. Check any carpeting to make sure that it is firmly attached to the stairs. Fix any carpet that is loose or worn. Avoid having throw rugs at the top or bottom of the stairs. If you do have throw rugs, attach them to the floor with carpet tape. Make sure that you have a light switch at the top of the stairs and the bottom of the stairs. If you do not have them, ask someone to add them for you. What else can I do to help prevent falls? Wear shoes that: Do not have high heels. Have rubber bottoms. Are comfortable and fit you well. Are closed at the toe. Do not wear sandals. If you use a stepladder: Make sure that it is fully opened. Do not climb a closed stepladder. Make sure that  both sides of the stepladder are locked into place. Ask someone to hold it for you, if possible. Clearly mark and make sure that you can see: Any grab bars or handrails. First and last steps. Where the edge of each step is. Use tools that help you move around (mobility aids) if they are needed. These include: Canes. Walkers. Scooters. Crutches. Turn on the lights when you go into a dark area. Replace any light bulbs as soon as they burn out. Set up your furniture so you have a clear path. Avoid moving your furniture around. If any of your floors are uneven, fix them. If there are any pets around you, be aware of where they are. Review your medicines with your doctor. Some medicines can make you feel dizzy. This can increase your chance of falling. Ask your doctor what other things that you can do to help prevent falls. This information is not intended to replace advice given to you by your health care provider. Make sure you discuss any questions you have with your health care provider. Document Released: 12/09/2008 Document Revised: 07/21/2015 Document Reviewed: 03/19/2014 Elsevier Interactive Patient Education  2017 [employer].

## 2022-04-14 LAB — URINE CULTURE
MICRO NUMBER:: 14558167
SPECIMEN QUALITY:: ADEQUATE

## 2022-04-26 DIAGNOSIS — J4540 Moderate persistent asthma, uncomplicated: Secondary | ICD-10-CM | POA: Diagnosis not present

## 2022-05-03 ENCOUNTER — Encounter: Payer: Self-pay | Admitting: Family

## 2022-05-03 ENCOUNTER — Ambulatory Visit: Payer: Medicare PPO | Admitting: Podiatry

## 2022-05-03 ENCOUNTER — Encounter: Payer: Self-pay | Admitting: Podiatry

## 2022-05-03 DIAGNOSIS — M778 Other enthesopathies, not elsewhere classified: Secondary | ICD-10-CM

## 2022-05-03 DIAGNOSIS — G5782 Other specified mononeuropathies of left lower limb: Secondary | ICD-10-CM

## 2022-05-03 DIAGNOSIS — N3000 Acute cystitis without hematuria: Secondary | ICD-10-CM

## 2022-05-03 DIAGNOSIS — I10 Essential (primary) hypertension: Secondary | ICD-10-CM

## 2022-05-03 NOTE — Progress Notes (Signed)
She presents today for follow-up of her capsulitis bilaterally states the left one hurts so bad and the right one just swells all the time I just cannot take anti-inflammatories they bother my stomach.  She states this been going on so long I feel like there is no into this.  Starting to affect her ability perform her daily activities and maintain good health.  Objective: Vital signs are stable she is alert and oriented x 3.  Has severe pain on palpation and attempted range of motion of the second metatarsal phalangeal joint of the left foot.  The toe is slightly dislocated medially.  Possible tear of the plantar capsule forefoot left.  Bilateral pitting edema left greater than right.  She also has a possible neuroma to the third interdigital space exquisitely tender on palpation with a palpable Mulder's click.  Assessment: Capsulitis forefoot possible tear of the plantar plate or capsule itself as well as the neuroma left foot with edema bilateral feet.  Plan: Requesting MRI of the forefoot left to rule out a plantar plate tear and neuroma.  This is for differential diagnosis as well as surgical planning.  I recommended that she follow-up with her primary care provider to help with her edema.

## 2022-05-04 MED ORDER — VALSARTAN 160 MG PO TABS: 160.0000 mg | ORAL_TABLET | Freq: Every day | ORAL | 0 refills | Status: DC

## 2022-05-04 NOTE — Addendum Note (Signed)
Addended by: [name] on: 05/04/2022 10:33 AM   Modules accepted: Orders

## 2022-05-05 ENCOUNTER — Encounter: Payer: Self-pay | Admitting: Cardiology

## 2022-05-05 DIAGNOSIS — I10 Essential (primary) hypertension: Secondary | ICD-10-CM

## 2022-05-08 ENCOUNTER — Telehealth: Payer: Self-pay | Admitting: *Deleted

## 2022-05-08 ENCOUNTER — Other Ambulatory Visit (INDEPENDENT_AMBULATORY_CARE_PROVIDER_SITE_OTHER): Payer: Medicare PPO

## 2022-05-08 DIAGNOSIS — N3000 Acute cystitis without hematuria: Secondary | ICD-10-CM | POA: Diagnosis not present

## 2022-05-08 DIAGNOSIS — I10 Essential (primary) hypertension: Secondary | ICD-10-CM

## 2022-05-08 NOTE — Telephone Encounter (Signed)
Pt came in for lab appt for urine culture and bmp.  Pt states she has not started the new blood medication yet.  Just picked it up and plans to start it tonight.  States she has a follow up with PCP on Friday of next week and will wait and do bmet at that visit.

## 2022-05-08 NOTE — Telephone Encounter (Signed)
Opened in error

## 2022-05-09 LAB — URINE CULTURE
MICRO NUMBER:: 14681323
SPECIMEN QUALITY:: ADEQUATE

## 2022-05-09 IMAGING — DX DG CHEST 2V
2 series · 2 of 2 positions shown · non-contrast
Comparison: June 19, 2020.

CLINICAL DATA: Chronic cough.

EXAM:
CHEST - 2 VIEW

[chest pa]
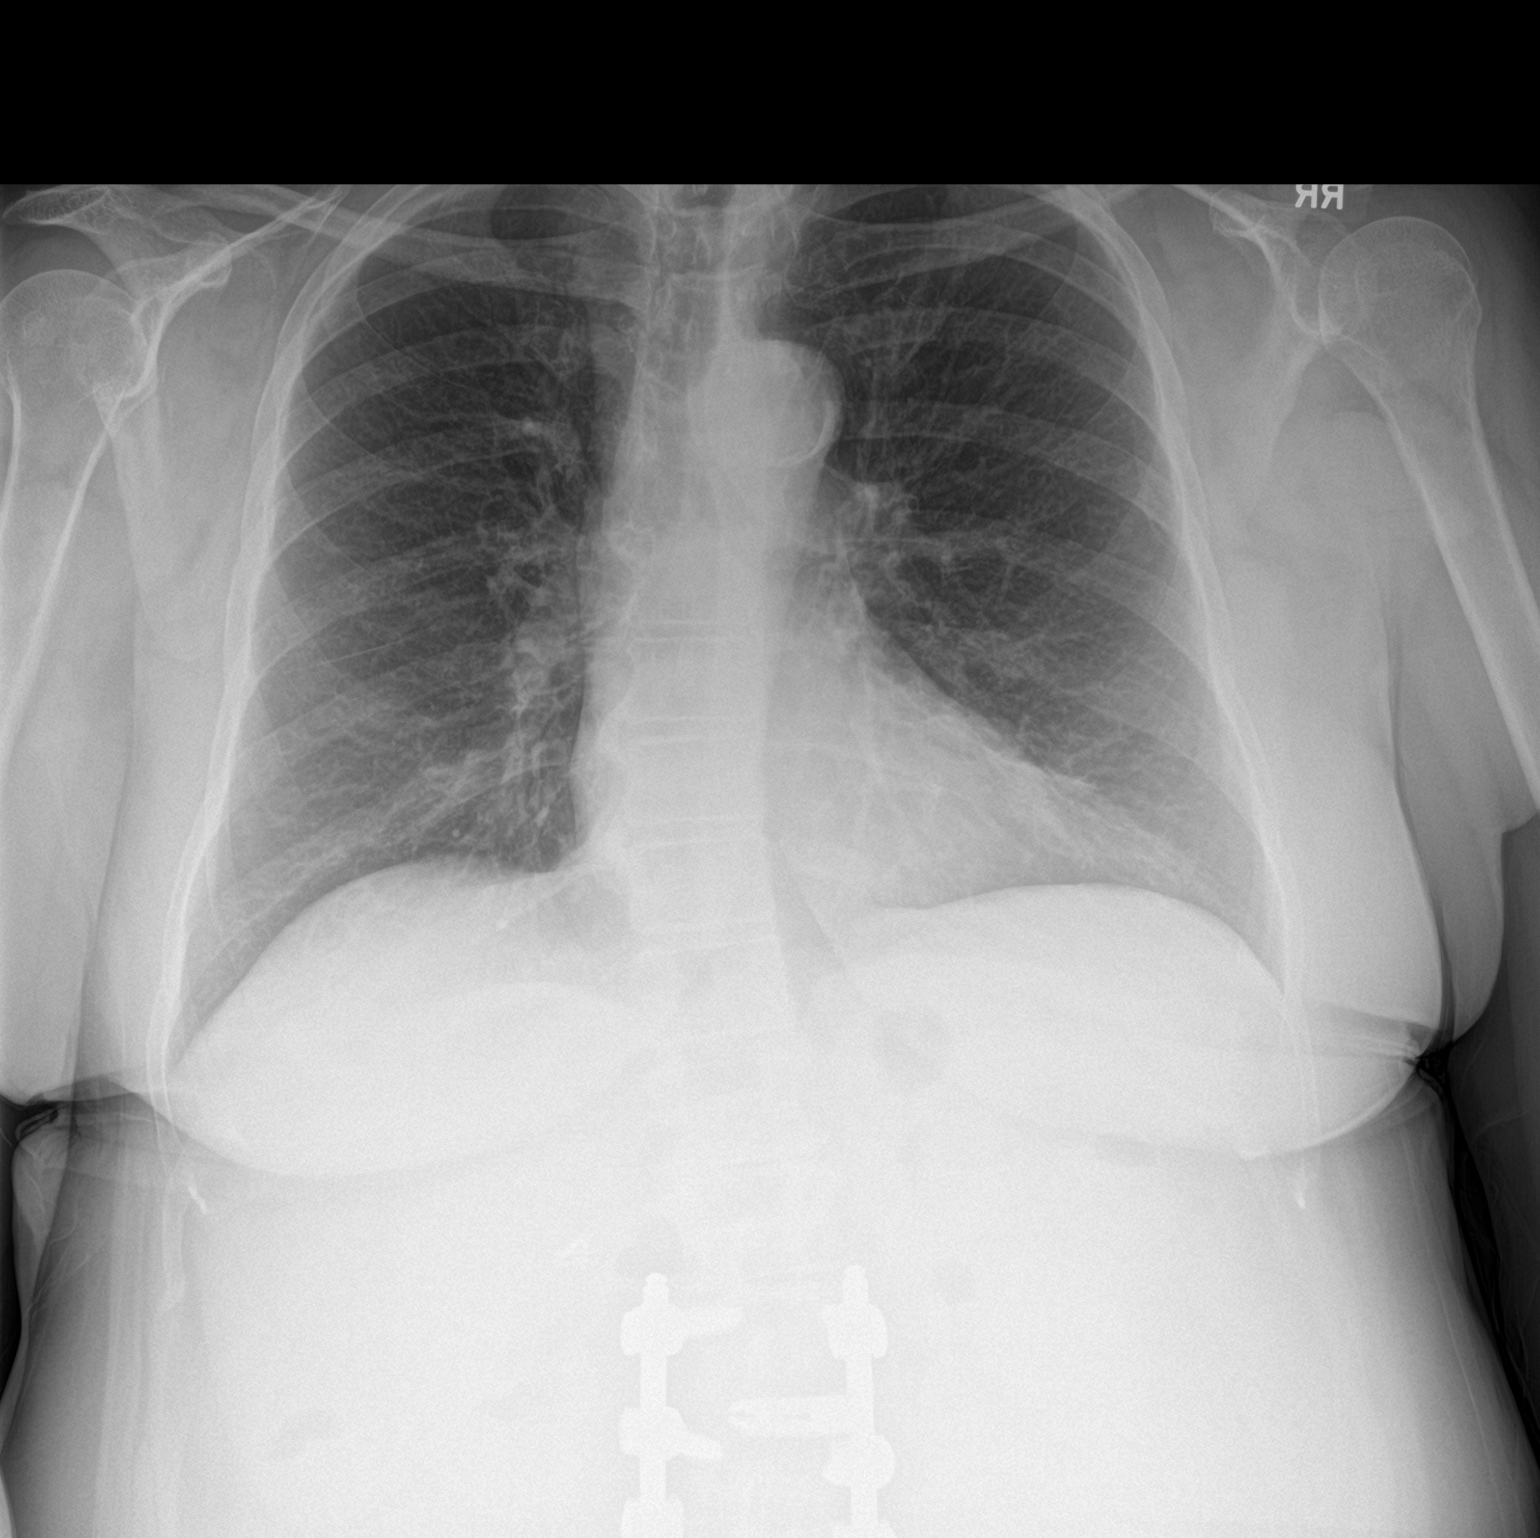

[chest lat]
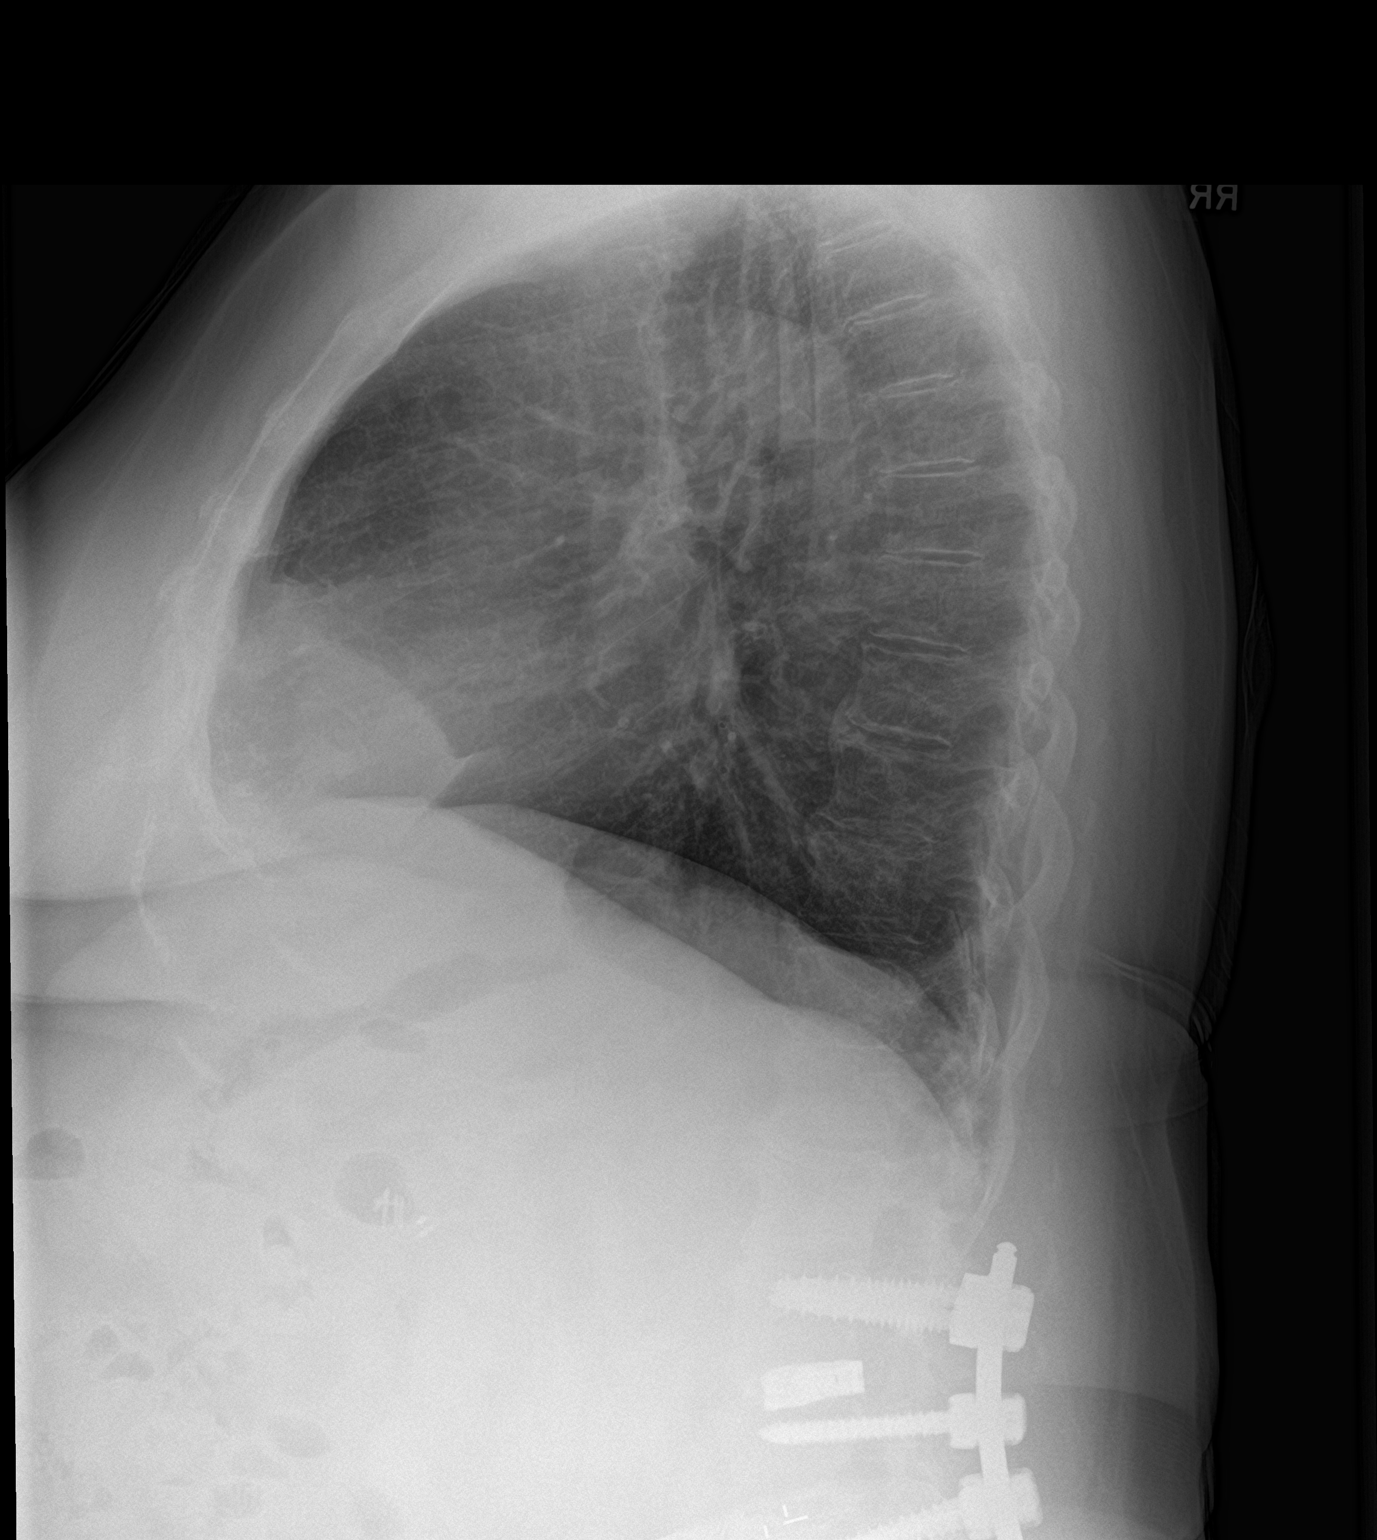

[2 of 2 positions shown; findings below may reference images not displayed]

FINDINGS: The heart size and mediastinal contours are within normal limits.
Both lungs are clear. The visualized skeletal structures are
unremarkable.
IMPRESSION: No active cardiopulmonary disease.

## 2022-05-10 DIAGNOSIS — J4540 Moderate persistent asthma, uncomplicated: Secondary | ICD-10-CM | POA: Diagnosis not present

## 2022-05-10 NOTE — Telephone Encounter (Signed)
Patient is following up due to not receiving any feedback yet. Please advise when able.

## 2022-05-10 NOTE — Addendum Note (Signed)
Addended by: Kelle Darting A on: 05/10/2022 10:49 AM   Modules accepted: Orders

## 2022-05-18 ENCOUNTER — Ambulatory Visit: Payer: Medicare PPO | Admitting: Family

## 2022-05-18 ENCOUNTER — Other Ambulatory Visit (INDEPENDENT_AMBULATORY_CARE_PROVIDER_SITE_OTHER): Payer: Medicare PPO

## 2022-05-18 VITALS — BP 138/86 | HR 74 | Temp 98.3°F | Resp 16 | Ht 63.0 in | Wt 192.8 lb

## 2022-05-18 DIAGNOSIS — J020 Streptococcal pharyngitis: Secondary | ICD-10-CM

## 2022-05-18 DIAGNOSIS — I10 Essential (primary) hypertension: Secondary | ICD-10-CM

## 2022-05-18 LAB — BASIC METABOLIC PANEL
BUN: 5 mg/dL — ABNORMAL LOW (ref 6–23)
CO2: 25 mEq/L (ref 19–32)
Calcium: 9.1 mg/dL (ref 8.4–10.5)
Chloride: 106 mEq/L (ref 96–112)
Creatinine, Ser: 0.74 mg/dL (ref 0.40–1.20)
GFR: 81.33 mL/min (ref >60.00)
Glucose, Bld: 98 mg/dL (ref 70–99)
Potassium: 4.1 mEq/L (ref 3.5–5.1)
Sodium: 141 mEq/L (ref 135–145)

## 2022-05-18 LAB — POCT RAPID STREP A (OFFICE): Rapid Strep A Screen: POSITIVE — AB

## 2022-05-18 MED ORDER — AMOXICILLIN 500 MG PO CAPS: 500.0000 mg | ORAL_CAPSULE | Freq: Three times a day (TID) | ORAL | 0 refills | Status: AC

## 2022-05-18 NOTE — Assessment & Plan Note (Signed)
New.  Will rx with amoxicillin. Tylenol, chloraseptic spray as needed for pain.

## 2022-05-18 NOTE — Assessment & Plan Note (Signed)
BP is controlled on Valsartan. Continue same obtain follow up bmet.

## 2022-05-18 NOTE — Progress Notes (Signed)
Subjective:   By signing my name below, I, Madelin Rear, attest that this documentation has been prepared under the direction and in the presence of [name], NP. 05/18/2022.   Patient ID: Brandy Dunlap, female    DOB: [birth year], 72 y.o.   MRN: [id number]  Chief Complaint  Patient presents with   Follow-up    Recheck BP    HPI Patient is in today for an office visit.  Right ear pain:  She complains of right ear pain with radiation inferiorly to her throat. Onset was a couple days ago. We will test for strep today.  Blood Pressure:  At her last visit, we increased her amlodipine from 5 mg to 7.5 mg daily. Due to swelling, her amlodipine was stopped and switched to valsartan. Last Friday she started taking the 160 mg valsartan, which she takes at night. She complains of side effects of lightheadedness in the mornings. BP Readings from Last 3 Encounters:  05/18/22 138/86  04/10/22 (!) 150/80  03/13/22 (!) 154/76    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   Essential hypertension 10/08/2007   Qualifier: Diagnosis of  By: Arnoldo Morale MD, Balinda Quails    GERD (gastroesophageal reflux disease)    History of kidney stones    per 09-02-2018 pre-op eval , suspected kidney stone , to [facility] CT abd on 7-9- for further eval , passive   Hypertension    Infectious disease 05/23/2015   Low back pain    Osteoarthritis    Pneumonia    Pre-diabetes     Prediabetes 07/09/2016   Referred otalgia of left ear 03/26/2016   Sinus trouble     Past Surgical History:  Procedure Laterality Date   CHOLECYSTECTOMY     COLONOSCOPY     COLONOSCOPY W/ BIOPSIES  10/06/2003   left knee replacement Left 05/2021   LUMBAR FUSION  2008   LUMBAR FUSION     2020   PARTIAL HIP ARTHROPLASTY Right 2009    hip replacement   TONSILLECTOMY     TOTAL HIP ARTHROPLASTY Left 09/10/2018   Procedure: TOTAL HIP ARTHROPLASTY ANTERIOR APPROACH;  Surgeon: Gaynelle Arabian, MD;  Location: [facility];  Service: Orthopedics;  Laterality: Left;   TOTAL KNEE ARTHROPLASTY Right 11/16/2013   Procedure: RIGHT TOTAL KNEE ARTHROPLASTY;  Surgeon: Vickey Huger, MD;  Location: [place];  Service: Orthopedics;  Laterality: Right;   VIDEO BRONCHOSCOPY Bilateral 12/05/2012   Procedure: VIDEO BRONCHOSCOPY WITHOUT FLUORO;  Surgeon: Collene Gobble, MD;  Location: [facility];  Service: Cardiopulmonary;  Laterality: Bilateral;    Family History  Problem Relation Age of Onset   COPD Mother    Heart disease Mother    Ovarian cancer Mother    Emphysema Mother    Hypertension Mother    Hyperlipidemia Mother    Heart Problems Mother    Stroke Mother  Thyroid disease Mother    Cancer Mother    COPD Father        died at age 43   Emphysema Father    Heart Problems Father    Heart disease Brother        stent with 90%   Breast cancer Maternal Aunt    Colon cancer Neg Hx    Esophageal cancer Neg Hx    Rectal cancer Neg Hx    Stomach cancer Neg Hx    Colon polyps Neg Hx     Social History   Socioeconomic History   Marital status: Single    Spouse name: Not on file   Number of children: 1   Years of education: Not on file   Highest education level: Bachelor's degree (e.g., BA, AB, BS)  Occupational History   Occupation: Product manager: [employer]  Tobacco Use   Smoking status: Never    Passive exposure: Never   Smokeless tobacco: Never  Vaping Use    Vaping Use: Never used  Substance and Sexual Activity   Alcohol use: No   Drug use: No   Sexual activity: Not Currently    Partners: Male    Birth control/protection: Post-menopausal  Other Topics Concern   Not on file  Social History Narrative   Divorced   One daughter- lives locally and one grandson   Retired Pharmacist, hospital,  Has masters degree   Enjoys reading, spending time with her grandson   Social Determinants of Health   Financial Resource Strain: [place]  (05/15/2022)   Overall Financial Resource Strain (CARDIA)    Difficulty of Paying Living Expenses: Not hard at all  Food Insecurity: No Food Insecurity (05/15/2022)   Hunger Vital Sign    Worried About Running Out of Food in the Last Year: Never true    [place] in the Last Year: Never true  Transportation Needs: No Transportation Needs (05/15/2022)   PRAPARE - Hydrologist (Medical): No    Lack of Transportation (Non-Medical): No  Physical Activity: Insufficiently Active (05/15/2022)   Exercise Vital Sign    Days of Exercise per Week: 3 days    Minutes of Exercise per Session: 40 min  Stress: No Stress Concern Present (05/15/2022)   [place]    Feeling of Stress : Not at all  Social Connections: Unknown (05/15/2022)   Social Connection and Isolation Panel [NHANES]    Frequency of Communication with Friends and Family: More than three times a week    Frequency of Social Gatherings with Friends and Family: Twice a week    Attends Religious Services: Patient declined    Marine scientist or Organizations: Patient declined    Attends Archivist Meetings: Not on file    Marital Status: Divorced  Intimate Partner Violence: Not At Risk (04/12/2022)   Humiliation, Afraid, Rape, and Kick questionnaire    Fear of Current or Ex-Partner: No    Emotionally Abused: No    Physically Abused: No    Sexually Abused: No     Outpatient Medications Prior to Visit  Medication Sig Dispense Refill   budesonide-formoterol (SYMBICORT) 160-4.5 MCG/ACT inhaler Inhale 2 puffs into the lungs 2 (two) times daily.     DEXILANT 60 MG capsule TAKE 1 CAPSULE BY MOUTH EVERY DAY 90 capsule 1   EPINEPHrine 0.3 mg/0.3 mL IJ SOAJ injection Inject 0.3  mg into the muscle as needed for anaphylaxis.     meclizine (ANTIVERT) 12.5 MG tablet Take 1 tablet (12.5 mg total) by mouth 3 (three) times daily as needed for dizziness. 30 tablet 0   montelukast (SINGULAIR) 10 MG tablet Take 1 tablet (10 mg total) by mouth at bedtime. 30 tablet 3   omalizumab (XOLAIR) 150 MG injection Inject 150 mg into the skin every 14 (fourteen) days.     traMADol (ULTRAM) 50 MG tablet Take 50-100 mg by mouth every 6 (six) hours as needed for pain.     valsartan (DIOVAN) 160 MG tablet Take 1 tablet (160 mg total) by mouth daily. 30 tablet 0   diclofenac sodium (VOLTAREN) 1 % GEL Apply 2 g topically 4 (four) times daily as needed. (Patient taking differently: Apply 2 g topically 4 (four) times daily as needed (L knee pain).) 100 g 3   celecoxib (CELEBREX) 100 MG capsule Take 1 capsule (100 mg total) by mouth 2 (two) times daily. 60 capsule 3   No facility-administered medications prior to visit.    Allergies  Allergen Reactions   Augmentin [Amoxicillin-Pot Clavulanate] Diarrhea   Pantoprazole Sodium Palpitations    Review of Systems  HENT:  Positive for ear pain (Right, with radiation to pharynx).   Neurological:  Positive for dizziness (Lightheadedness in the AM).       Objective:    Physical Exam Constitutional:      Appearance: Normal appearance.  HENT:     Head: Normocephalic and atraumatic.     Right Ear: Tympanic membrane, ear canal and external ear normal.     Left Ear: Tympanic membrane, ear canal and external ear normal.     Mouth/Throat:     Comments: Pharynx not well visualized due to large tongue  Eyes:     Extraocular Movements:  Extraocular movements intact.     Pupils: Pupils are equal, round, and reactive to light.  Cardiovascular:     Rate and Rhythm: Normal rate and regular rhythm.     Heart sounds: Normal heart sounds. No murmur heard.    No gallop.  Pulmonary:     Effort: Pulmonary effort is normal. No respiratory distress.     Breath sounds: Normal breath sounds. No wheezing or rales.  Musculoskeletal:     Cervical back: Neck supple.  Lymphadenopathy:     Cervical: No cervical adenopathy.  Skin:    General: Skin is warm and dry.  Neurological:     General: No focal deficit present.     Mental Status: She is alert and oriented to person, place, and time.  Psychiatric:        Mood and Affect: Mood normal.        Behavior: Behavior normal.     BP 138/86 (BP Location: Right Arm, Patient Position: Sitting, Cuff Size: Normal)   Pulse 74   Temp 98.3 F (36.8 C) (Oral)   Resp 16   Ht 5\' 3"  (1.6 m)   Wt 192 lb 12.8 oz (87.5 kg)   SpO2 98%   BMI 34.15 kg/m  Wt Readings from Last 3 Encounters:  05/18/22 192 lb 12.8 oz (87.5 kg)  04/10/22 188 lb (85.3 kg)  01/30/22 184 lb (83.5 kg)       Assessment & Plan:   Problem List Items Addressed This Visit       Unprioritized   Strep throat    New.  Will rx with amoxicillin. Tylenol, chloraseptic spray as needed for pain.  Essential hypertension - Primary    BP is controlled on Valsartan. Continue same obtain follow up bmet.       Relevant Orders   Basic Metabolic Panel (BMET)     Meds ordered this encounter  Medications   amoxicillin (AMOXIL) 500 MG capsule    Sig: Take 1 capsule (500 mg total) by mouth 3 (three) times daily for 10 days.    Dispense:  30 capsule    Refill:  0    Order Specific Question:   Supervising Provider    Answer:   Penni Homans A [[id number]]    I, Nance Pear, NP, personally preformed the services described in this documentation.  All medical record entries made by the scribe were at my direction and  in my presence.  I have reviewed the chart and discharge instructions (if applicable) and agree that the record reflects my personal performance and is accurate and complete. 05/18/2022.  I,Mathew Stumpf,acting as a Education administrator for [employer], NP.,have documented all relevant documentation on the behalf of Nance Pear, NP,as directed by  Nance Pear, NP while in the presence of Nance Pear, NP.   Nance Pear, NP

## 2022-05-25 ENCOUNTER — Encounter: Payer: Self-pay | Admitting: Podiatry

## 2022-05-25 ENCOUNTER — Ambulatory Visit
Admission: RE | Admit: 2022-05-25 | Discharge: 2022-05-25 | Disposition: A | Discharge status: Home / Self Care | Payer: Medicare PPO | Source: Ambulatory Visit | Attending: Family | Admitting: Family

## 2022-05-25 DIAGNOSIS — Z1231 Encounter for screening mammogram for malignant neoplasm of breast: Secondary | ICD-10-CM

## 2022-05-28 ENCOUNTER — Ambulatory Visit
Admission: RE | Admit: 2022-05-28 | Discharge: 2022-05-28 | Disposition: A | Discharge status: Home / Self Care | Payer: Medicare PPO | Source: Ambulatory Visit | Attending: Podiatry | Admitting: Podiatry

## 2022-05-28 ENCOUNTER — Other Ambulatory Visit: Payer: Self-pay | Admitting: Family

## 2022-05-28 DIAGNOSIS — M25475 Effusion, left foot: Secondary | ICD-10-CM | POA: Diagnosis not present

## 2022-05-28 DIAGNOSIS — S99922A Unspecified injury of left foot, initial encounter: Secondary | ICD-10-CM | POA: Diagnosis not present

## 2022-05-28 DIAGNOSIS — M778 Other enthesopathies, not elsewhere classified: Secondary | ICD-10-CM

## 2022-05-28 DIAGNOSIS — G5782 Other specified mononeuropathies of left lower limb: Secondary | ICD-10-CM

## 2022-05-29 ENCOUNTER — Encounter: Payer: Self-pay | Admitting: Family

## 2022-05-29 MED ORDER — VALSARTAN 160 MG PO TABS: 160.0000 mg | ORAL_TABLET | Freq: Every day | ORAL | 0 refills | Status: DC

## 2022-05-29 NOTE — Telephone Encounter (Signed)
Patient called to make PCP aware that she will need a referral. Please advise.

## 2022-05-30 ENCOUNTER — Other Ambulatory Visit: Payer: Self-pay | Admitting: Family

## 2022-05-30 ENCOUNTER — Other Ambulatory Visit: Payer: Self-pay

## 2022-05-30 DIAGNOSIS — R42 Dizziness and giddiness: Secondary | ICD-10-CM

## 2022-05-30 NOTE — Telephone Encounter (Signed)
[facility] (down the hall from [name]) called stating the patient has never been to that office and they have no documentation of ever seeing the patient. They did see through care everywhere, that the patient saw someone at [facility] on April 2023 and she might be getting confused on who she saw. They requested a call to be made to the patient to figure out where she wants to go. Please advise.

## 2022-05-30 NOTE — Telephone Encounter (Signed)
Pt called to advise that [facility] still has not received the referral. Located an order under Active Requests by Dutch Quint, NP. Called [facility] medicine office to leave vm requesting call back if there was an issue with how the order was placed for pt's Physical therapy. Patient aware a message was left requesting them to call back.   Manuela Schwartz with [facility] walked over to advise that the referral has to be placed as a referral and not as an order. Please send referral for patient so that her appt is not canceled for tomorrow.

## 2022-05-30 NOTE — Therapy (Signed)
OUTPATIENT PHYSICAL THERAPY VESTIBULAR EVALUATION     Patient Name: Brandy Dunlap MRN: [id number] DOB:August 03, 1950, 72 y.o., female Today's Date: 05/31/2022  PCP: [name], NP REFERRING PROVIDER: [name], NP   PT End of Session - 05/31/22 0805     Visit Number 1    Number of Visits 8    Date for PT Re-Evaluation 06/28/22    Authorization Type [employer] MCR    PT Start Time 0805    PT Stop Time 0845    PT Time Calculation (min) 40 min              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   Essential hypertension 10/08/2007   Qualifier: Diagnosis of  By: Arnoldo Morale MD, Balinda Quails    GERD (gastroesophageal reflux disease)    History of kidney stones    per 09-02-2018 pre-op eval , suspected kidney stone , to [facility] CT abd on 7-9- for further eval , passive   Hypertension    Infectious disease 05/23/2015   Low back pain    Osteoarthritis    Pneumonia    Pre-diabetes    Prediabetes 07/09/2016   Referred otalgia of left ear 03/26/2016   Sinus trouble    Past Surgical History:  Procedure Laterality Date   CHOLECYSTECTOMY     COLONOSCOPY     COLONOSCOPY W/ BIOPSIES  10/06/2003   left knee replacement Left 05/2021   LUMBAR FUSION  2008   LUMBAR FUSION     2020   PARTIAL HIP ARTHROPLASTY Right 2009    hip replacement   TONSILLECTOMY     TOTAL HIP ARTHROPLASTY Left 09/10/2018   Procedure: TOTAL HIP ARTHROPLASTY ANTERIOR  APPROACH;  Surgeon: Gaynelle Arabian, MD;  Location: [facility];  Service: Orthopedics;  Laterality: Left;   TOTAL KNEE ARTHROPLASTY Right 11/16/2013   Procedure: RIGHT TOTAL KNEE ARTHROPLASTY;  Surgeon: Vickey Huger, MD;  Location: [place];  Service: Orthopedics;  Laterality: Right;   VIDEO BRONCHOSCOPY Bilateral 12/05/2012   Procedure: VIDEO BRONCHOSCOPY WITHOUT FLUORO;  Surgeon: Collene Gobble, MD;  [facility];  Service: Cardiopulmonary;  Laterality: Bilateral;   Patient Active Problem List   Diagnosis Date Noted   Strep throat 05/18/2022   COVID-19 virus infection 01/31/2022   Thrombosis of popliteal vein 09/18/2021   Urinary urgency 08/01/2021   Benign paroxysmal positional vertigo 05/08/2021   Acute serous otitis media of left ear 05/08/2021   Preoperative examination 04/05/2021   Urine frequency 03/10/2021   Dyslipidemia 10/04/2020   Allergic rhinitis due to animal (cat) (dog) hair and dander 07/07/2020   Allergic rhinitis due to pollen 07/07/2020   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   Moderate persistent asthma, uncomplicated [id number]   Pre-diabetes    History of kidney stones    Bronchitis with bronchospasm    IDA (iron deficiency anemia) 07/06/2020   Spinal stenosis,  lumbar region without neurogenic claudication 06/04/2019   Body mass index (BMI) 35.0-35.9, adult 05/01/2019   Degenerative scoliosis in adult patient 05/01/2019   Asthma 04/18/2017   Cervical spondylosis without myelopathy 09/23/2015   Infectious disease 05/23/2015   Morbid obesity 04/25/2015   Preop cardiovascular exam 10/25/2014   History of total knee arthroplasty 11/16/2013   Cough 06/26/2012   Cobalamin deficiency 09/21/2009   Allergic rhinitis 06/29/2009   Arthralgia of left temporomandibular joint 03/19/2008   Essential hypertension 10/08/2007   OSTEOARTHRITIS 10/08/2007   Osteoarthritis of hip 10/08/2007   Low back pain 07/24/2007   Gastroesophageal reflux disease 07/24/2007    Colon polyp 10/06/2003    ONSET DATE: 05/28/22  REFERRING DIAG: R42 (ICD-10-CM) - Recurrent vertigo   THERAPY DIAG:  BPPV (benign paroxysmal positional vertigo), left  Dizziness and giddiness  BPPV (benign paroxysmal positional vertigo), right  Rationale for Evaluation and Treatment Rehabilitation  SUBJECTIVE:   SUBJECTIVE STATEMENT: Its been happening since Monday, but its both sides, worse on the left.  This time my head feels heavy.  I had strep throat for the 1st time in 30 years, went to bed Monday, rolled over on L and was spinning.   Last night rolled over on R and was spinning.  A little nauseated, this is new, but it may be because coming off of the antibiotic from strep.  Very tired too.   PERTINENT HISTORY: R TKA, L TKA, R hearing loss, arthritis neck, LBP, lumbar fusions.  Recurrent vertigo, last seen September 2023 and March 2023 for BPPV.   PAIN:  Are you having pain? No  PRECAUTIONS: None  WEIGHT BEARING RESTRICTIONS No  FALLS: Has patient fallen in last 6 months? No  LIVING ENVIRONMENT:  PLOF: Independent  PATIENT GOALS stop the spinning  OBJECTIVE:   DIAGNOSTIC FINDINGS: NA  COGNITION: Overall cognitive status: Within functional limits for tasks assessed   SENSATION: Not tested  EDEMA:  NA  POSTURE: rounded shoulders and forward head   Cervical ROM:    Active AROM (Deg) 05/30/22  Flexion 30  Extension 30  Right lateral flexion   Left lateral flexion   Right rotation 70  Left rotation 70  (Blank rows = not tested)  STRENGTH: 5/5 UE strength   LOWER EXTREMITY MMT:  5/5 LE strength  GAIT: Gait pattern: WFL Distance walked: 59' Assistive device utilized: Single point cane Level of assistance: Modified independence Comments: no significant deviation  FUNCTIONAL TESTs:    PATIENT SURVEYS:  DHI 18%   VESTIBULAR ASSESSMENT   GENERAL OBSERVATION: enters independent in no apparent distress.      SYMPTOM  BEHAVIOR:   Subjective history: see above   Non-Vestibular symptoms:  nausea   Type of dizziness: Spinning/Vertigo   Frequency: daily at night when rolling over   Duration: R side 10 sec, L side 20 sec    Aggravating factors:  rolling over   Relieving factors:  holding still   Progression of symptoms:  no change since start   OCULOMOTOR EXAM:   Ocular Alignment: normal   Ocular ROM: No Limitations   Spontaneous Nystagmus: absent   Gaze-Induced Nystagmus: age appropriate nystagmus at end range   Smooth Pursuits: saccades especially with horizontal eye movements.    Saccades: hypometric/undershoots       VESTIBULAR - OCULAR REFLEX:    Slow VOR: Normal   VOR Cancellation: Normal   Head-Impulse Test: HIT Right: negative HIT Left: negative   Dynamic Visual Acuity:  NT  POSITIONAL TESTING: Right Dix-Hallpike: no nystagmus Left Dix-Hallpike: no nystagmus Left Roll Test: apogeotropic nystagmus and Duration: 10 seconds    MOTION SENSITIVITY:    Motion Sensitivity Quotient  Intensity: 0 = none, 1 = Lightheaded, 2 = Mild, 3 = Moderate, 4 = Severe, 5 = Vomiting  Intensity  1. Sitting to supine   2. Supine to L side 5  3. Supine to R side   4. Supine to sitting   5. L Hallpike-Dix 1  6. Up from L  0  7. R Hallpike-Dix 1  8. Up from R  0  9. Sitting, head  tipped to L knee   10. Head up from L  knee   11. Sitting, head  tipped to R knee   12. Head up from R  knee   13. Sitting head turns x5 0  14.Sitting head nods x5 0  15. In stance, 180  turn to L    16. In stance, 180  turn to R     OTHOSTATICS: NT    VESTIBULAR TREATMENT:  Canalith Repositioning: Left Barbeque Roll 2 x    Comment: decreased symptoms, no dizziness rolling to left side, decreased feeling of heaviness in head   PATIENT EDUCATION: Education details: findings, POC, L BBQ roll Person educated: Patient Education method: Explanation, Demonstration, and Handouts Education comprehension:  verbalized understanding   GOALS: Goals reviewed with patient? Yes  SHORT TERM GOALS: Target date: 06/14/2022   Patient will reports solution of dizziness symptoms when rolling in bed.  Baseline:  Goal status: INITIAL  2.  Patient will demonstrate independence with VOR exercises.  Baseline:   Goal status: INITIAL   LONG TERM GOALS: Target date: 06/28/2022    Patient will report complete resolution of vertigo.  Baseline:  Goal status: INITIAL  2.  Patient will report 6 points or less on DHI Baseline:  18% Goal status: INITIAL  3.  Patient will demonstrate safety with gait and balance.  Baseline:  Goal status: INITIAL    ASSESSMENT:  CLINICAL IMPRESSION: Brandy Dunlap  is a 72 y.o. female who was seen today for physical therapy evaluation and treatment for recurraent vertigo.  She has been treated for BPPV 2 previous episodes, last episode was about 6 months ago.  Today she reports dizziness rolling both sides.  She was negative for posterior canal on both sides, but positive for horizontal canalithiasis on left side.  Left horizontal canalithiasis treated with left BBQ roll, after 1st time reported dizziness rolling back to left side at end of first maneuver, repeated, each position held 1 min, after second repetition no dizziness rolling to left side.  Right side not tested today as left side worse, will check on Monday if left side has resolved. Hand out provided for left BBQ roll if symptoms return this weekend.  Brandy Dunlap would benefit from skilled therapy to resolve BPPV, decrease dizziness, and decrease risk of falls.   .    OBJECTIVE IMPAIRMENTS decreased activity tolerance, decreased balance, decreased ROM, dizziness, postural dysfunction, and pain.   ACTIVITY LIMITATIONS bending, squatting, bed mobility, and dressing  PARTICIPATION LIMITATIONS: cleaning and community activity  PERSONAL FACTORS Past/current experiences and 1-2 comorbidities: recurrent  vertigo, R hearing loss, cervical arthritis, lumbar fusion, LBP  are also affecting patient's functional outcome.   REHAB POTENTIAL: Excellent  CLINICAL DECISION MAKING: Stable/uncomplicated  EVALUATION COMPLEXITY: Low   PLAN: PT FREQUENCY: 1-2x/week  PT DURATION: 4 weeks  PLANNED INTERVENTIONS: Therapeutic exercises, Therapeutic  activity, Neuromuscular re-education, Balance training, Gait training, Patient/Family education, Self Care, Joint mobilization, Vestibular training, Canalith repositioning, Dry Needling, Electrical stimulation, Spinal mobilization, Cryotherapy, Moist heat, Ultrasound, Manual therapy, and Re-evaluation  PLAN FOR NEXT SESSION: reassess canals, especially R side if L has resolved, balance testing.    Rennie Natter, PT, DPT  05/31/2022, 10:25 AM

## 2022-05-31 ENCOUNTER — Encounter: Payer: Self-pay | Admitting: Physical Therapy

## 2022-05-31 ENCOUNTER — Ambulatory Visit: Payer: Medicare PPO | Attending: Family | Admitting: Physical Therapy

## 2022-05-31 DIAGNOSIS — H8112 Benign paroxysmal vertigo, left ear: Secondary | ICD-10-CM | POA: Diagnosis not present

## 2022-05-31 DIAGNOSIS — R42 Dizziness and giddiness: Secondary | ICD-10-CM | POA: Insufficient documentation

## 2022-05-31 DIAGNOSIS — H8111 Benign paroxysmal vertigo, right ear: Secondary | ICD-10-CM | POA: Insufficient documentation

## 2022-05-31 DIAGNOSIS — J4540 Moderate persistent asthma, uncomplicated: Secondary | ICD-10-CM | POA: Diagnosis not present

## 2022-06-01 ENCOUNTER — Telehealth: Payer: Self-pay | Admitting: Podiatry

## 2022-06-01 NOTE — Telephone Encounter (Signed)
Lvm to sched appt for MRI results & treatment

## 2022-06-01 NOTE — Telephone Encounter (Signed)
-----   Message from Kristian Covey, [facility] sent at 06/01/2022  7:07 AM EDT -----  ----- Message ----- From: Elinor Parkinson, DPM Sent: 05/31/2022   5:10 PM EDT To: Kristian Covey, PMAC  Have her in.

## 2022-06-04 ENCOUNTER — Encounter: Payer: Self-pay | Admitting: Physical Therapy

## 2022-06-04 ENCOUNTER — Ambulatory Visit: Payer: Medicare PPO | Admitting: Physical Therapy

## 2022-06-04 DIAGNOSIS — R42 Dizziness and giddiness: Secondary | ICD-10-CM

## 2022-06-04 DIAGNOSIS — H8111 Benign paroxysmal vertigo, right ear: Secondary | ICD-10-CM | POA: Diagnosis not present

## 2022-06-04 DIAGNOSIS — H8112 Benign paroxysmal vertigo, left ear: Secondary | ICD-10-CM

## 2022-06-04 NOTE — Therapy (Addendum)
OUTPATIENT PHYSICAL THERAPY TREATMENT     Patient Name: Brandy Dunlap MRN: [id number] DOB:09/27/1950, 72 y.o., female Today's Date: 06/04/2022  PCP: Sandford Craze'sullivan, Melissa, NP REFERRING PROVIDER: Sandford Craze'sullivan, Melissa, NP   PT End of Session - 06/04/22 1613     Visit Number 2    Number of Visits 8    Date for PT Re-Evaluation 06/28/22    Authorization Type [employer] MCR    Authorization Time Period 05/31/22-06/28/22    PT Start Time 1610    PT Stop Time 1645    PT Time Calculation (min) 35 min              Past Medical History:  Diagnosis Date   Acute pain of left knee 10/02/2019   Allergic rhinitis 06/29/2009   Qualifier: Diagnosis of  By: Rita Oharahrasher, Gwenn     Allergic rhinitis due to animal (cat) (dog) hair and dander 07/07/2020   Allergy    Asthma    Atypical chest pain 04/18/2017   B12 deficiency    Back pain    Body mass index (BMI) 35.0-35.9, adult 05/01/2019   Bronchitis    hx   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   Chronic neck pain 03/01/2015   Colon polyp 10/06/2003   Depressive disorder 10/01/2016   PT DENIES   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   Essential hypertension 10/08/2007   Qualifier: Diagnosis of  By: Lovell SheehanJenkins MD, Balinda QuailsJohn E    GERD (gastroesophageal reflux disease)    History of kidney stones    per 09-02-2018 pre-op eval , suspected kidney stone [facility] for further eval , passive   Hypertension    Infectious disease 05/23/2015   Low back pain    Osteoarthritis    Pneumonia    Pre-diabetes    Prediabetes 07/09/2016   Referred otalgia of left ear 03/26/2016   Sinus trouble    Past Surgical History:  Procedure Laterality Date   CHOLECYSTECTOMY     COLONOSCOPY     COLONOSCOPY W/ BIOPSIES  10/06/2003   left knee replacement Left 05/2021   LUMBAR FUSION  2008   LUMBAR FUSION     2020   PARTIAL HIP ARTHROPLASTY Right 2009    hip replacement   TONSILLECTOMY     TOTAL HIP ARTHROPLASTY Left 09/10/2018   Procedure: TOTAL  HIP ARTHROPLASTY ANTERIOR APPROACH;  Surgeon: Ollen GrossAluisio, Frank, MD;  Location: [facility];  Service: Orthopedics;  Laterality: Left;   TOTAL KNEE ARTHROPLASTY Right 11/16/2013   Procedure: RIGHT TOTAL KNEE ARTHROPLASTY;  Surgeon: Dannielle HuhSteve Lucey, MD;  Location: [facility];  Service: Orthopedics;  Laterality: Right;   VIDEO BRONCHOSCOPY Bilateral 12/05/2012   Procedure: VIDEO BRONCHOSCOPY WITHOUT FLUORO;  Surgeon: Leslye Peerobert S Byrum, MD;  Location: [facility];  Service: Cardiopulmonary;  Laterality: Bilateral;   Patient Active Problem List   Diagnosis Date Noted   Strep throat 05/18/2022   COVID-19 virus infection 01/31/2022   Thrombosis of popliteal vein 09/18/2021   Urinary urgency 08/01/2021   Benign paroxysmal positional vertigo 05/08/2021   Acute serous otitis media of left ear 05/08/2021   Preoperative examination 04/05/2021   Urine frequency 03/10/2021   Dyslipidemia 10/04/2020   Allergic rhinitis due to animal (cat) (dog) hair and dander 07/07/2020   Allergic rhinitis due to pollen 07/07/2020   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   Moderate persistent asthma, uncomplicated 07/07/2020   Pre-diabetes    History of kidney stones    Bronchitis with bronchospasm    IDA (iron deficiency  anemia) 07/06/2020   Spinal stenosis, lumbar region without neurogenic claudication 06/04/2019   Body mass index (BMI) 35.0-35.9, adult 05/01/2019   Degenerative scoliosis in adult patient 05/01/2019   Asthma 04/18/2017   Cervical spondylosis without myelopathy 09/23/2015   Infectious disease 05/23/2015   Morbid obesity 04/25/2015   Preop cardiovascular exam 10/25/2014   History of total knee arthroplasty 11/16/2013   Cough 06/26/2012   Cobalamin deficiency 09/21/2009   Allergic rhinitis 06/29/2009   Arthralgia of left temporomandibular joint 03/19/2008   Essential hypertension 10/08/2007   OSTEOARTHRITIS 10/08/2007   Osteoarthritis of hip 10/08/2007   Low back pain 07/24/2007   Gastroesophageal  reflux disease 07/24/2007   Colon polyp 10/06/2003    ONSET DATE: 05/28/22  REFERRING DIAG: R42 (ICD-10-CM) - Recurrent vertigo   THERAPY DIAG:  BPPV (benign paroxysmal positional vertigo), left  Dizziness and giddiness  Rationale for Evaluation and Treatment Rehabilitation  SUBJECTIVE:   SUBJECTIVE STATEMENT: Its been happening since Monday, but its both sides, worse on the left.  This time my head feels heavy.  I had strep throat for the 1st time in 30 years, went to bed Monday, rolled over on L and was spinning.   Last night rolled over on R and was spinning.  A little nauseated, this is new, but it may be because coming off of the antibiotic from strep.  Very tired too.   PERTINENT HISTORY: R TKA, L TKA, R hearing loss, arthritis neck, LBP, lumbar fusions.  Recurrent vertigo, last seen September 2023 and March 2023 for BPPV.   PAIN:  Are you having pain? No  PRECAUTIONS: None  WEIGHT BEARING RESTRICTIONS No  FALLS: Has patient fallen in last 6 months? No  LIVING ENVIRONMENT:  PLOF: Independent  PATIENT GOALS stop the spinning  OBJECTIVE:   DIAGNOSTIC FINDINGS: NA  COGNITION: Overall cognitive status: Within functional limits for tasks assessed   SENSATION: Not tested  EDEMA:  NA  POSTURE: rounded shoulders and forward head   Cervical ROM:    Active AROM (Deg) 05/30/22  Flexion 30  Extension 30  Right lateral flexion   Left lateral flexion   Right rotation 70  Left rotation 70  (Blank rows = not tested)  STRENGTH: 5/5 UE strength   LOWER EXTREMITY MMT:  5/5 LE strength  GAIT: Gait pattern: WFL Distance walked: 15' Assistive device utilized: Single point cane Level of assistance: Modified independence Comments: no significant deviation  FUNCTIONAL TESTs:    PATIENT SURVEYS:  DHI 18%   VESTIBULAR ASSESSMENT   GENERAL OBSERVATION: enters independent in no apparent distress.      SYMPTOM BEHAVIOR:   Subjective history: see  above   Non-Vestibular symptoms:  nausea   Type of dizziness: Spinning/Vertigo   Frequency: daily at night when rolling over   Duration: R side 10 sec, L side 20 sec    Aggravating factors:  rolling over   Relieving factors:  holding still   Progression of symptoms:  no change since start   OCULOMOTOR EXAM:   Ocular Alignment: normal   Ocular ROM: No Limitations   Spontaneous Nystagmus: absent   Gaze-Induced Nystagmus: age appropriate nystagmus at end range   Smooth Pursuits: saccades especially with horizontal eye movements.    Saccades: hypometric/undershoots       VESTIBULAR - OCULAR REFLEX:    Slow VOR: Normal   VOR Cancellation: Normal   Head-Impulse Test: HIT Right: negative HIT Left: negative   Dynamic Visual Acuity:  NT    POSITIONAL  TESTING: Right Dix-Hallpike: no nystagmus Left Dix-Hallpike: no nystagmus Left Roll Test: apogeotropic nystagmus and Duration: 10 seconds    MOTION SENSITIVITY:    Motion Sensitivity Quotient  Intensity: 0 = none, 1 = Lightheaded, 2 = Mild, 3 = Moderate, 4 = Severe, 5 = Vomiting  Intensity  1. Sitting to supine   2. Supine to L side 5  3. Supine to R side   4. Supine to sitting   5. L Hallpike-Dix 1  6. Up from L  0  7. R Hallpike-Dix 1  8. Up from R  0  9. Sitting, head  tipped to L knee   10. Head up from L  knee   11. Sitting, head  tipped to R knee   12. Head up from R  knee   13. Sitting head turns x5 0  14.Sitting head nods x5 0  15. In stance, 180  turn to L    16. In stance, 180  turn to R     OTHOSTATICS: NT    VESTIBULAR TREATMENT: 06/04/22 Neuromuscular Reeducation: to decrease dizziness Recheck of all canals (dix hallpike R & L, R & L roll test x 2)  VOR x 1 exercises - standing 3 x 10 horizontal Manual Therapy: to decrease muscle spasm and pain and improve mobility STM/TPR to cervical paraspinals, gentle cervical traction, NAGS into rotation.    05/31/22 Canalith Repositioning: Left Barbeque Roll 2  x    Comment: decreased symptoms, no dizziness rolling to left side, decreased feeling of heaviness in head   PATIENT EDUCATION: Education details: VOR exercises.  Person educated: Patient Education method: Medical illustrator Education comprehension: verbalized understanding and returned demonstration   GOALS: Goals reviewed with patient? Yes  SHORT TERM GOALS: Target date: 06/14/2022   Patient will reports solution of dizziness symptoms when rolling in bed.  Baseline:  Goal status: MET 06/04/22 - met 2.  Patient will demonstrate independence with VOR exercises.  Baseline:   Goal status: MET 06/04/22- reviewed in standing, has copies still at home of exercise, completed correctly.    LONG TERM GOALS: Target date: 06/28/2022    Patient will report complete resolution of vertigo.  Baseline:  Goal status: IN PROGRESS  2.  Patient will report 6 points or less on DHI Baseline:  18% Goal status: IN PROGRESS  3.  Patient will demonstrate safety with gait and balance.  Baseline:  Goal status: IN PROGRESS    ASSESSMENT:  CLINICAL IMPRESSION: Brandy Dunlap  reports no episodes of spinning since IE.  She reports some feeling of dizziness still with head movements in standing.  Rechecked all canals, all were clear, with no dizziness, nystagmus or symptoms, including both horizontal and both posterior.  She did have some tightness in her neck, which was addressed with manual therapy today.  Afterwards reviewed VOR exercises, did report a little dizziness with horizontal VOR but very quickly resolved.  Brandy Dunlap plans on requesting referral to ENT due to frequency of these episodes of BPPV, but is very happy that we were able to address her BPPV so quickly this time.  She requests 30 day hold since her spinning has resolved and she feels confident with VOR exercises reviewed today.     OBJECTIVE IMPAIRMENTS decreased activity tolerance, decreased balance, decreased ROM, dizziness,  postural dysfunction, and pain.   ACTIVITY LIMITATIONS bending, squatting, bed mobility, and dressing  PARTICIPATION LIMITATIONS: cleaning and community activity  PERSONAL FACTORS Past/current experiences and 1-2 comorbidities: recurrent  vertigo, R hearing loss, cervical arthritis, lumbar fusion, LBP  are also affecting patient's functional outcome.   REHAB POTENTIAL: Excellent  CLINICAL DECISION MAKING: Stable/uncomplicated  EVALUATION COMPLEXITY: Low   PLAN: PT FREQUENCY: 1-2x/week  PT DURATION: 4 weeks  PLANNED INTERVENTIONS: Therapeutic exercises, Therapeutic activity, Neuromuscular re-education, Balance training, Gait training, Patient/Family education, Self Care, Joint mobilization, Vestibular training, Canalith repositioning, Dry Needling, Electrical stimulation, Spinal mobilization, Cryotherapy, Moist heat, Ultrasound, Manual therapy, and Re-evaluation  PLAN FOR NEXT SESSION: 30 day hold  Jena Gauss, PT, DPT  06/04/2022, 4:59 PM   PHYSICAL THERAPY DISCHARGE SUMMARY  Visits from Start of Care: 2  Current functional level related to goals / functional outcomes: Resolution of vertigo symptoms.    Remaining deficits: None    Education / Equipment: HEP   Plan: Patient agrees to discharge. Patient is being discharged due to meeting the stated rehab goals.  She was placed on 30 day hold on 06/04/2022 in case vertigo symptoms returned but has not needed therapy since that date so is now discharged.    Jena Gauss, PT  07/12/2022 3:25 PM

## 2022-06-07 ENCOUNTER — Ambulatory Visit: Payer: Medicare PPO | Admitting: Physical Therapy

## 2022-06-11 ENCOUNTER — Inpatient Hospital Stay: Payer: Medicare PPO | Attending: Hematology and Oncology | Admitting: Hematology and Oncology

## 2022-06-11 ENCOUNTER — Encounter: Payer: Self-pay | Admitting: Hematology and Oncology

## 2022-06-11 ENCOUNTER — Inpatient Hospital Stay: Payer: Medicare PPO

## 2022-06-11 VITALS — BP 150/59 | HR 70 | Temp 97.9°F | Resp 16 | Ht 63.0 in | Wt 191.5 lb

## 2022-06-11 DIAGNOSIS — D509 Iron deficiency anemia, unspecified: Secondary | ICD-10-CM | POA: Insufficient documentation

## 2022-06-11 DIAGNOSIS — Z79899 Other long term (current) drug therapy: Secondary | ICD-10-CM | POA: Insufficient documentation

## 2022-06-11 DIAGNOSIS — Z96649 Presence of unspecified artificial hip joint: Secondary | ICD-10-CM | POA: Insufficient documentation

## 2022-06-11 LAB — CBC WITH DIFFERENTIAL/PLATELET
Abs Immature Granulocytes: 0.01 10*3/uL (ref 0.00–0.07)
Basophils Absolute: 0.1 10*3/uL (ref 0.0–0.1)
Basophils Relative: 1 %
Eosinophils Absolute: 0.4 10*3/uL (ref 0.0–0.5)
Eosinophils Relative: 6 %
HCT: 42.9 % (ref 36.0–46.0)
Hemoglobin: 14.3 g/dL (ref 12.0–15.0)
Immature Granulocytes: 0 %
Lymphocytes Relative: 30 %
Lymphs Abs: 1.9 10*3/uL (ref 0.7–4.0)
MCH: 30 pg (ref 26.0–34.0)
MCHC: 33.3 g/dL (ref 30.0–36.0)
MCV: 89.9 fL (ref 80.0–100.0)
Monocytes Absolute: 0.5 10*3/uL (ref 0.1–1.0)
Monocytes Relative: 7 %
Neutro Abs: 3.5 10*3/uL (ref 1.7–7.7)
Neutrophils Relative %: 56 %
Platelets: 256 10*3/uL (ref 150–400)
RBC: 4.77 MIL/uL (ref 3.87–5.11)
RDW: 13.3 % (ref 11.5–15.5)
WBC: 6.3 10*3/uL (ref 4.0–10.5)
nRBC: 0 % (ref 0.0–0.2)

## 2022-06-11 LAB — FERRITIN: Ferritin: 71 ng/mL (ref 11–307)

## 2022-06-11 LAB — IRON AND IRON BINDING CAPACITY (CC-WL,HP ONLY)
Iron: 90 ug/dL (ref 28–170)
Saturation Ratios: 21 % (ref 10.4–31.8)
TIBC: 434 ug/dL (ref 250–450)
UIBC: 344 ug/dL (ref 148–442)

## 2022-06-11 NOTE — Progress Notes (Signed)
[name] CONSULT NOTE  Patient Care Team: Sandford Craze, NP as PCP - General (Internal Medicine)  CHIEF COMPLAINTS/PURPOSE OF CONSULTATION:  IDA  ASSESSMENT & PLAN:   This is a very pleasant 72 year old female patient with hypertension, iron deficiency who is here for follow-up on iron deficiency anemia since last visit, she received intravenous Venofer which she tolerated well except for severe nausea.  After addition of premedication, she tolerated rest of the cycles well.  She also had endoscopy and colonoscopy which did not show any evidence for malignancy.   She continues to feel very well, except for a couple episodes of vertigo.  She is working with her PCP.  She otherwise feels well, no pica, no shortness of breath or chest pain and hoping that her blood work looks good today. Physical examination today without any findings.  CBC, iron panel and ferritin today.  If her labs are without any iron deficiency anemia she can return to clinic in 1 year or sooner as needed.  HISTORY OF PRESENTING ILLNESS:  Brandy Dunlap 72 y.o. female is here because of IDA.  This is a very pleasant 72 year old female patient with past medical history significant for hip replacement, iron deficiency anemia referred to hematology for evaluation of iron deficiency anemia.  She feels very well, well exercising regularly, anticipating left knee replacement on Monday.  No pica no hematochezia or melena.  Since her last visit here, she denies any new health complaints.  She had a couple episodes of vertigo and was thought to be from benign positional vertigo, takes Antivert as needed.  She also had some strep throat which is what triggered the vertigo she states.  She otherwise feels well, no shortness of breath, no pica. Rest of the pertinent 10 point ROS reviewed and negative.   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   Essential hypertension 10/08/2007   Qualifier: Diagnosis of  By: Lovell Sheehan MD, Balinda Quails    GERD (gastroesophageal reflux disease)    History of kidney stones    per 09-02-2018 pre-op eval , suspected kidney stone , to [facility] abd on 7-9- for further eval , passive   Hypertension    Infectious disease 05/23/2015   Low back pain    Osteoarthritis    Pneumonia    Pre-diabetes    Prediabetes 07/09/2016   Referred otalgia of left ear 03/26/2016   Sinus trouble     SURGICAL HISTORY: Past Surgical History:  Procedure Laterality Date   CHOLECYSTECTOMY     COLONOSCOPY     COLONOSCOPY W/ BIOPSIES  10/06/2003   left knee replacement Left 05/2021   LUMBAR FUSION  2008   LUMBAR FUSION     2020   PARTIAL HIP ARTHROPLASTY Right 2009    hip replacement   TONSILLECTOMY     TOTAL  HIP ARTHROPLASTY Left 09/10/2018   Procedure: TOTAL HIP ARTHROPLASTY ANTERIOR APPROACH;  Surgeon: Ollen Gross, MD;  Location: [facility];  Service: Orthopedics;  Laterality: Left;   TOTAL KNEE ARTHROPLASTY Right 11/16/2013   Procedure: RIGHT TOTAL KNEE ARTHROPLASTY;  Surgeon: Dannielle Huh, MD;  Location: [facility];  Service: Orthopedics;  Laterality: Right;   VIDEO BRONCHOSCOPY Bilateral 12/05/2012   Procedure: VIDEO BRONCHOSCOPY WITHOUT FLUORO;  Surgeon: Leslye Peer, MD;  Location: [facility];  Service: Cardiopulmonary;  Laterality: Bilateral;    SOCIAL HISTORY: Social History   Socioeconomic History   Marital status: Single     Spouse name: Not on file   Number of children: 1   Years of education: Not on file   Highest education level: Bachelor's degree (e.g., BA, AB, BS)  Occupational History   Occupation: Magazine features editor: [employer]  Tobacco Use   Smoking status: Never    Passive exposure: Never   Smokeless tobacco: Never  Vaping Use   Vaping Use: Never used  Substance and Sexual Activity   Alcohol use: No   Drug use: No   Sexual activity: Not Currently    Partners: Male    Birth control/protection: Post-menopausal  Other Topics Concern   Not on file  Social History Narrative   Divorced   One daughter- lives locally and one grandson   Retired Runner, broadcasting/film/video,  Has masters degree   Enjoys reading, spending time with her grandson   Social Determinants of Health   Financial Resource Strain: Low Risk  (05/15/2022)   Overall Financial Resource Strain (CARDIA)    Difficulty of Paying Living Expenses: Not hard at all  Food Insecurity: No Food Insecurity (05/15/2022)   Hunger Vital Sign    Worried About Running Out of Food in the Last Year: Never true    Ran Out of Food in the Last Year: Never true  Transportation Needs: No Transportation Needs (05/15/2022)   PRAPARE - Administrator, Civil Service (Medical): No    Lack of Transportation (Non-Medical): No  Physical Activity: Insufficiently Active (05/15/2022)   Exercise Vital Sign    Days of Exercise per Week: 3 days    Minutes of Exercise per Session: 40 min  Stress: No Stress Concern Present (05/15/2022)   [employer] of Occupational Health - Occupational Stress Questionnaire    Feeling of Stress : Not at all  Social Connections: Unknown (05/15/2022)   Social Connection and Isolation Panel [NHANES]    Frequency of Communication with Friends and Family: More than three times a week    Frequency of Social Gatherings with Friends and Family: Twice a week    Attends Religious Services: Patient declined    Doctor, general practice or Organizations: Patient declined    Attends Banker Meetings: Not on file    Marital Status: Divorced  Intimate Partner Violence: Not At Risk (04/12/2022)   Humiliation, Afraid, Rape, and Kick questionnaire    Fear of Current or Ex-Partner: No    Emotionally Abused: No    Physically Abused: No    Sexually Abused: No    FAMILY HISTORY: Family History  Problem Relation Age of Onset   COPD Mother    Heart disease Mother    Ovarian cancer Mother    Emphysema Mother    Hypertension Mother    Hyperlipidemia Mother    Heart Problems Mother    Stroke Mother    Thyroid disease Mother  Cancer Mother    COPD Father        died at age 76   Emphysema Father    Heart Problems Father    Heart disease Brother        stent with 90%   Breast cancer Maternal Aunt    Colon cancer Neg Hx    Esophageal cancer Neg Hx    Rectal cancer Neg Hx    Stomach cancer Neg Hx    Colon polyps Neg Hx     ALLERGIES:  is allergic to augmentin [amoxicillin-pot clavulanate] and pantoprazole sodium.  MEDICATIONS:  Current Outpatient Medications  Medication Sig Dispense Refill   budesonide-formoterol (SYMBICORT) 160-4.5 MCG/ACT inhaler Inhale 2 puffs into the lungs 2 (two) times daily.     DEXILANT 60 MG capsule TAKE 1 CAPSULE BY MOUTH EVERY DAY 90 capsule 1   diclofenac sodium (VOLTAREN) 1 % GEL Apply 2 g topically 4 (four) times daily as needed. (Patient taking differently: Apply 2 g topically 4 (four) times daily as needed (L knee pain).) 100 g 3   EPINEPHrine 0.3 mg/0.3 mL IJ SOAJ injection Inject 0.3 mg into the muscle as needed for anaphylaxis.     meclizine (ANTIVERT) 12.5 MG tablet Take 1 tablet (12.5 mg total) by mouth 3 (three) times daily as needed for dizziness. 30 tablet 0   montelukast (SINGULAIR) 10 MG tablet Take 1 tablet (10 mg total) by mouth at bedtime. 30 tablet 3   omalizumab (XOLAIR) 150 MG injection Inject 150 mg into the skin every 14 (fourteen) days.      traMADol (ULTRAM) 50 MG tablet Take 50-100 mg by mouth every 6 (six) hours as needed for pain.     valsartan (DIOVAN) 160 MG tablet Take 1 tablet (160 mg total) by mouth daily. 90 tablet 0   No current facility-administered medications for this visit.    PHYSICAL EXAMINATION:  ECOG PERFORMANCE STATUS: 0 - Asymptomatic  Vitals:   06/11/22 1139  BP: (!) 150/59  Pulse: 70  Resp: 16  Temp: 97.9 F (36.6 C)  SpO2: 96%   Filed Weights   06/11/22 1139  Weight: 191 lb 8 oz (86.9 kg)    GENERAL:alert, no distress and comfortable SKIN: skin color, texture, turgor are normal, no rashes or significant lesions EYES: normal, conjunctiva are pink and non-injected, sclera clear OROPHARYNX:no exudate, no erythema and lips, buccal mucosa, and tongue normal  NECK: supple, thyroid normal size, non-tender, without nodularity LYMPH:  no palpable lymphadenopathy in the cervical, axillary  LUNGS: clear to auscultation and percussion with normal breathing effort HEART: regular rate & rhythm and no murmurs and no lower extremity edema ABDOMEN:abdomen soft, non-tender and normal bowel sounds Musculoskeletal:no cyanosis of digits and no clubbing  PSYCH: alert & oriented x 3 with fluent speech NEURO: no focal motor/sensory deficits  LABORATORY DATA:  I have reviewed the data as listed Lab Results  Component Value Date   WBC 13.7 (H) 01/22/2022   HGB 14.3 01/22/2022   HCT 42.1 01/22/2022   MCV 90.3 01/22/2022   PLT 239 01/22/2022     Chemistry      Component Value Date/Time   NA 141 05/18/2022 1104   NA 145 (H) 04/10/2018 1031   K 4.1 05/18/2022 1104   CL 106 05/18/2022 1104   CO2 25 05/18/2022 1104   BUN 5 (L) 05/18/2022 1104   BUN 12 04/10/2018 1031   CREATININE 0.74 05/18/2022 1104   CREATININE 0.71 06/08/2021 1338   CREATININE 0.73  04/24/2019 1557      Component Value Date/Time   CALCIUM 9.1 05/18/2022 1104   ALKPHOS 67 06/08/2021 1338   AST 44 (H) 06/08/2021 1338   ALT 44  06/08/2021 1338   BILITOT 0.6 06/08/2021 1338     Colonoscopy 08/30/2020 normal perianal and digital rectal exams, multiple diverticula found in sigmoid colon and descending colon.  Upper GI endoscopy showed 5 cm hiatal hernia, 2 diminutive sessile polyps with no bleeding, pathology from this polyps were hyperplastic polyps.  Rest of the endoscopy without any concerning findings.    RADIOGRAPHIC STUDIES: I have personally reviewed the radiological images as listed and agreed with the findings in the report. MR FOOT LEFT WO CONTRAST  Result Date: 05/29/2022 CLINICAL DATA:  Foot trauma, tendon injury or dislocation suspected, xray equivocal (Age >= 6y) Evaluate capsulitis and 2nd MPJ capsule tear, also possible neuroma left foot - surgical consideration EXAM: MRI OF THE LEFT FOOT WITHOUT CONTRAST TECHNIQUE: Multiplanar, multisequence MR imaging of the left forefoot was performed. No intravenous contrast was administered. COMPARISON:  Left foot radiograph 01/16/2022 FINDINGS: Motion degraded exam. Bones/Joint/Cartilage There is no evidence of acute fracture. Mild to moderate third and fourth TMT joint osteoarthritis mild subchondral marrow edema. Mild first and second MTP joint osteoarthritis. Ligaments Intact Lisfranc ligament. Intact MTP collateral ligaments. No evidence of plantar plate tear. Muscles and Tendons No acute tendon tear or tenosynovitis. No significant muscle edema or muscle atrophy. Soft tissues Mild generalized soft tissue swelling of the foot most prominent dorsally. Focal subcutaneous fluid collection along the plantar aspect of the second MTP joint, measuring 1.3 x 0.5 x 2.0 cm (axial T2 image 28, sagittal stir image 13). No evidence of intermetatarsal neuroma. IMPRESSION: Focal subcutaneous fluid collection along the plantar aspect of the second MTP joint, fluid measuring 1.3 x 0.5 x 2.0 cm, likely representing adventitial bursitis. No evidence of intermetatarsal neuroma. Intact MTP  collateral ligaments.  No plantar plate tear. Mild first and second MTP joint osteoarthritis. Mild to moderate third and fourth TMT joint osteoarthritis. Electronically Signed   By: Caprice Renshaw M.D.   On: 05/29/2022 16:19   MM 3D SCREEN BREAST BILATERAL  Result Date: 05/28/2022 CLINICAL DATA:  Screening. EXAM: DIGITAL SCREENING BILATERAL MAMMOGRAM WITH TOMOSYNTHESIS AND CAD TECHNIQUE: Bilateral screening digital craniocaudal and mediolateral oblique mammograms were obtained. Bilateral screening digital breast tomosynthesis was performed. The images were evaluated with computer-aided detection. COMPARISON:  Previous exam(s). ACR Breast Density Category b: There are scattered areas of fibroglandular density. FINDINGS: There are no findings suspicious for malignancy. IMPRESSION: No mammographic evidence of malignancy. A result letter of this screening mammogram will be mailed directly to the patient. RECOMMENDATION: Screening mammogram in one year. (Code:SM-B-01Y) BI-RADS CATEGORY  1: Negative. Electronically Signed   By: Sherian Rein M.D.   On: 05/28/2022 13:47    All questions were answered. The patient knows to call the clinic with any problems, questions or concerns. I spent 20 minutes in the care of this patient including H and P, review of records, counseling and coordination of care.     Rachel Moulds, MD 06/11/2022 11:52 AM

## 2022-06-12 ENCOUNTER — Ambulatory Visit: Payer: Medicare PPO | Admitting: Podiatry

## 2022-06-14 ENCOUNTER — Encounter: Payer: Self-pay | Admitting: Family

## 2022-06-14 DIAGNOSIS — R42 Dizziness and giddiness: Secondary | ICD-10-CM

## 2022-06-14 DIAGNOSIS — J4540 Moderate persistent asthma, uncomplicated: Secondary | ICD-10-CM | POA: Diagnosis not present

## 2022-06-15 ENCOUNTER — Encounter: Payer: Self-pay | Admitting: Hematology and Oncology

## 2022-06-21 DIAGNOSIS — M25562 Pain in left knee: Secondary | ICD-10-CM | POA: Diagnosis not present

## 2022-06-26 ENCOUNTER — Ambulatory Visit: Payer: Medicare PPO | Admitting: Podiatry

## 2022-06-26 ENCOUNTER — Encounter: Payer: Self-pay | Admitting: Podiatry

## 2022-06-26 DIAGNOSIS — M7752 Other enthesopathy of left foot: Secondary | ICD-10-CM

## 2022-06-26 MED ORDER — TRIAMCINOLONE ACETONIDE 40 MG/ML IJ SUSP
20.0000 mg | Freq: Once | INTRAMUSCULAR | Status: AC
Start: 2022-06-26 — End: 2022-06-26
  Administered 2022-06-26: 20 mg

## 2022-06-26 NOTE — Progress Notes (Signed)
She presents today for follow-up of her MRI.  States that is unchanged still painful she refers to her left foot beneath the second metatarsal phalangeal joint.  Objective: Vital signs stable alert oriented x 3 palpable mass beneath the head of the second metatarsal of the left foot demonstrated on MRI as a bursitis.  I was able to visualize this as well it measures approximately 1.2 cm in diameter.  Assessment: Plantar bursitis subsecond metatarsophalangeal joint left foot.  Plan: Discussed etiology pathology and surgical therapies at this point we injected 5 mg of Kenalog directly into the bursa from the plantar aspect.  Should this fail to alleviate her symptoms we will consider surgical intervention for removal of breath.

## 2022-06-28 DIAGNOSIS — J4540 Moderate persistent asthma, uncomplicated: Secondary | ICD-10-CM | POA: Diagnosis not present

## 2022-06-29 ENCOUNTER — Other Ambulatory Visit: Payer: Self-pay

## 2022-07-02 ENCOUNTER — Encounter: Payer: Self-pay | Admitting: Cardiology

## 2022-07-02 ENCOUNTER — Ambulatory Visit: Payer: Medicare PPO | Attending: Cardiology | Admitting: Cardiology

## 2022-07-02 VITALS — BP 140/80 | HR 67 | Ht 63.0 in | Wt 191.0 lb

## 2022-07-02 DIAGNOSIS — I10 Essential (primary) hypertension: Secondary | ICD-10-CM | POA: Diagnosis not present

## 2022-07-02 DIAGNOSIS — E785 Hyperlipidemia, unspecified: Secondary | ICD-10-CM

## 2022-07-02 DIAGNOSIS — Z87442 Personal history of urinary calculi: Secondary | ICD-10-CM | POA: Diagnosis not present

## 2022-07-02 MED ORDER — AMLODIPINE BESYLATE 5 MG PO TABS: 5.0000 mg | ORAL_TABLET | Freq: Every day | ORAL | 3 refills | Status: DC

## 2022-07-02 MED ORDER — HYDROCHLOROTHIAZIDE 12.5 MG PO CAPS: 12.5000 mg | ORAL_CAPSULE | Freq: Every day | ORAL | 3 refills | Status: DC

## 2022-07-02 NOTE — Progress Notes (Signed)
Cardiology Office Note:    Date:  07/02/2022   ID:  Brandy Dunlap, DOB 1950-10-17, MRN [id number]  PCP:  Sandford Craze, NP  Cardiologist:  Gypsy Balsam, MD    Referring MD: Sandford Craze, NP   Chief Complaint  Patient presents with   Hypertension   Medication Management    Valsartan causing dizziness    History of Present Illness:    Brandy Dunlap is a 72 y.o. female with past medical history significant for asthma, iron deficiency anemia, essential hypertension, dyslipidemia, atypical chest pain.  She does have chronic arthritis.  Recently with make some changes to her medications.  We did stop amlodipine because swelling of lower extremity and restart valsartan, however, she clearly cannot tolerate this medication.  She feels awful tired exhausted start taking this and evening time but not much help.  She also suffering from bursitis in the left foot.  She did have some injection done.  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   Essential hypertension 10/08/2007   Qualifier: Diagnosis of  By: Lovell Sheehan MD, Balinda Quails    GERD (gastroesophageal reflux disease)    History of kidney stones    per 09-02-2018 pre-op eval , suspected kidney stone , to [facility] CT abd on 7-9- for further eval , passive   Hypertension    Infectious disease 05/23/2015   Low back pain    Osteoarthritis    Pneumonia    Pre-diabetes    Prediabetes 07/09/2016    Referred otalgia of left ear 03/26/2016   Sinus trouble     Past Surgical History:  Procedure Laterality Date   CHOLECYSTECTOMY     COLONOSCOPY     COLONOSCOPY W/ BIOPSIES  10/06/2003   left knee replacement Left 05/2021   LUMBAR FUSION  2008   LUMBAR FUSION     2020   PARTIAL HIP ARTHROPLASTY Right 2009    hip replacement   TONSILLECTOMY     TOTAL HIP ARTHROPLASTY Left 09/10/2018   Procedure: TOTAL HIP ARTHROPLASTY ANTERIOR APPROACH;  Surgeon: Ollen Gross, MD;  Location: [facility];  Service: Orthopedics;  Laterality: Left;   TOTAL KNEE ARTHROPLASTY Right 11/16/2013   Procedure: RIGHT TOTAL KNEE ARTHROPLASTY;  Surgeon: Dannielle Huh, MD;  Location: [facility];  Service: Orthopedics;  Laterality: Right;   VIDEO BRONCHOSCOPY Bilateral 12/05/2012   Procedure: VIDEO BRONCHOSCOPY WITHOUT FLUORO;  Surgeon: Leslye Peer, MD;  Location: [facility];  Service: Cardiopulmonary;  Laterality: Bilateral;    Current Medications: Current Meds  Medication Sig   budesonide-formoterol (SYMBICORT) 160-4.5 MCG/ACT inhaler Inhale 2 puffs into the lungs 2 (two) times daily.   DEXILANT 60 MG capsule TAKE 1 CAPSULE BY MOUTH EVERY DAY (Patient taking differently: Take 60 mg by mouth daily.)   EPINEPHrine 0.3 mg/0.3 mL IJ SOAJ injection Inject 0.3 mg into the muscle as needed for anaphylaxis.  meclizine (ANTIVERT) 12.5 MG tablet Take 1 tablet (12.5 mg total) by mouth 3 (three) times daily as needed for dizziness.   montelukast (SINGULAIR) 10 MG tablet Take 1 tablet (10 mg total) by mouth at bedtime.   omalizumab (XOLAIR) 150 MG injection Inject 150 mg into the skin every 14 (fourteen) days.   traMADol (ULTRAM) 50 MG tablet Take 50-100 mg by mouth every 6 (six) hours as needed for pain.   valsartan (DIOVAN) 160 MG tablet Take 1 tablet (160 mg total) by mouth daily.     Allergies:   Augmentin [amoxicillin-pot clavulanate] and Pantoprazole sodium   Social History   Socioeconomic History   Marital status:  Single    Spouse name: Not on file   Number of children: 1   Years of education: Not on file   Highest education level: Bachelor's degree (e.g., BA, AB, BS)  Occupational History   Occupation: Magazine features editor: [employer]  Tobacco Use   Smoking status: Never    Passive exposure: Never   Smokeless tobacco: Never  Vaping Use   Vaping Use: Never used  Substance and Sexual Activity   Alcohol use: No   Drug use: No   Sexual activity: Not Currently    Partners: Male    Birth control/protection: Post-menopausal  Other Topics Concern   Not on file  Social History Narrative   Divorced   One daughter- lives locally and one grandson   Retired Runner, broadcasting/film/video,  Has masters degree   Enjoys reading, spending time with her grandson   Social Determinants of Health   Financial Resource Strain: Low Risk  (05/15/2022)   Overall Financial Resource Strain (CARDIA)    Difficulty of Paying Living Expenses: Not hard at all  Food Insecurity: No Food Insecurity (05/15/2022)   Hunger Vital Sign    Worried About Running Out of Food in the Last Year: Never true    Ran Out of Food in the Last Year: Never true  Transportation Needs: No Transportation Needs (05/15/2022)   PRAPARE - Administrator, Civil Service (Medical): No    Lack of Transportation (Non-Medical): No  Physical Activity: Insufficiently Active (05/15/2022)   Exercise Vital Sign    Days of Exercise per Week: 3 days    Minutes of Exercise per Session: 40 min  Stress: No Stress Concern Present (05/15/2022)   [employer] of Occupational Health - Occupational Stress Questionnaire    Feeling of Stress : Not at all  Social Connections: Unknown (05/15/2022)   Social Connection and Isolation Panel [NHANES]    Frequency of Communication with Friends and Family: More than three times a week    Frequency of Social Gatherings with Friends and Family: Twice a week    Attends Religious Services: Patient declined    Automotive engineer or Organizations: Patient declined    Attends Engineer, structural: Not on file    Marital Status: Divorced     Family History: The patient's family history includes Breast cancer in her maternal aunt; COPD in her father and mother; Cancer in her mother; Emphysema in her father and mother; Heart Problems in her father and mother; Heart disease in her brother and mother; Hyperlipidemia in her mother; Hypertension in her mother; Ovarian cancer in her mother; Stroke in her mother; Thyroid disease in her mother. There is no history of Colon cancer, Esophageal cancer, Rectal cancer, Stomach cancer, or Colon polyps. ROS:   Please see the  history of present illness.    All 14 point review of systems negative except as described per history of present illness  EKGs/Labs/Other Studies Reviewed:      Recent Labs: 05/18/2022: BUN 5; Creatinine, Ser 0.74; Potassium 4.1; Sodium 141 06/11/2022: Hemoglobin 14.3; Platelets 256  Recent Lipid Panel    Component Value Date/Time   CHOL 187 10/31/2021 1101   TRIG 116 10/31/2021 1101   HDL 59 10/31/2021 1101   CHOLHDL 3.2 10/31/2021 1101   CHOLHDL 4 01/24/2021 0939   VLDL 30.2 01/24/2021 0939   LDLCALC 107 (H) 10/31/2021 1101   LDLDIRECT 149.9 09/11/2010 0908    Physical Exam:    VS:  BP (!) 140/80 (BP Location: Left Arm, Patient Position: Sitting)   Pulse 67   Ht 5\' 3"  (1.6 m)   Wt 191 lb (86.6 kg)   SpO2 94%   BMI 33.83 kg/m     Wt Readings from Last 3 Encounters:  07/02/22 191 lb (86.6 kg)  06/11/22 191 lb 8 oz (86.9 kg)  05/18/22 192 lb 12.8 oz (87.5 kg)     GEN:  Well nourished, well developed in no acute distress HEENT: Normal NECK: No JVD; No carotid bruits LYMPHATICS: No lymphadenopathy CARDIAC: RRR, no murmurs, no rubs, no gallops RESPIRATORY:  Clear to auscultation without rales, wheezing or rhonchi  ABDOMEN: Soft, non-tender, non-distended MUSCULOSKELETAL:  No edema; No deformity  SKIN: Warm and  dry LOWER EXTREMITIES: no swelling NEUROLOGIC:  Alert and oriented x 3 PSYCHIATRIC:  Normal affect   ASSESSMENT:    1. Essential hypertension   2. Dyslipidemia   3. History of kidney stones    PLAN:    In order of problems listed above:  Essential hypertension will stop her valsartan will go back to amlodipine with small dose of hydrochlorothiazide only 12.5 daily, Chem-7 need to be checked next week. Dyslipidemia did review K PN which show me LDL of 107 HDL 59 but her calcium score is 0, conservative approach with diet and exercises. History of kidney stones stable.   Medication Adjustments/Labs and Tests Ordered: Current medicines are reviewed at length with the patient today.  Concerns regarding medicines are outlined above.  No orders of the defined types were placed in this encounter.  Medication changes: No orders of the defined types were placed in this encounter.   Signed, Georgeanna Lea, MD, [facility] 07/02/2022 11:01 AM    [facility]

## 2022-07-02 NOTE — Addendum Note (Signed)
Addended by: Baldo Ash D on: 07/02/2022 11:10 AM   Modules accepted: Orders

## 2022-07-02 NOTE — Patient Instructions (Signed)
Medication Instructions:   STOP: Valsartan  START: Amlodipine 5mg /HCTZ 12.5mg  daily   Lab Work: Your physician recommends that you return for lab work in: 1 week You need to have labs done when you are fasting.  You can come Monday through Friday 8:00 am to 11:30 am and 1:00 to 4:00. You do not need to make an appointment as the order has already been placed. The labs you are going to have done are BMET.    Testing/Procedures: None Ordered   Follow-Up: At [facility], you and your health needs are our priority.  As part of our continuing mission to provide you with exceptional heart care, we have created designated Provider Care Teams.  These Care Teams include your primary Cardiologist (physician) and Advanced Practice Providers (APPs -  Physician Assistants and Nurse Practitioners) who all work together to provide you with the care you need, when you need it.  We recommend signing up for the patient portal called "MyChart".  Sign up information is provided on this After Visit Summary.  MyChart is used to connect with patients for Virtual Visits (Telemedicine).  Patients are able to view lab/test results, encounter notes, upcoming appointments, etc.  Non-urgent messages can be sent to your provider as well.   To learn more about what you can do with MyChart, go to [email or web address].    Your next appointment:   6 month(s)  The format for your next appointment:   In Person  Provider:   Gypsy Balsam, MD    Other Instructions NA

## 2022-07-12 DIAGNOSIS — J4540 Moderate persistent asthma, uncomplicated: Secondary | ICD-10-CM | POA: Diagnosis not present

## 2022-07-23 ENCOUNTER — Other Ambulatory Visit: Payer: Self-pay | Admitting: Family

## 2022-07-24 DIAGNOSIS — I10 Essential (primary) hypertension: Secondary | ICD-10-CM | POA: Diagnosis not present

## 2022-07-25 ENCOUNTER — Encounter: Payer: Self-pay | Admitting: Cardiology

## 2022-07-25 LAB — BASIC METABOLIC PANEL
BUN/Creatinine Ratio: 12 (ref 12–28)
BUN: 9 mg/dL (ref 8–27)
CO2: 24 mmol/L (ref 20–29)
Calcium: 9.3 mg/dL (ref 8.7–10.3)
Chloride: 101 mmol/L (ref 96–106)
Creatinine, Ser: 0.77 mg/dL (ref 0.57–1.00)
Glucose: 106 mg/dL — ABNORMAL HIGH (ref 70–99)
Potassium: 4.3 mmol/L (ref 3.5–5.2)
Sodium: 140 mmol/L (ref 134–144)
eGFR: 82 mL/min/{1.73_m2} (ref >59)

## 2022-07-26 ENCOUNTER — Encounter: Payer: Self-pay | Admitting: Family

## 2022-07-26 ENCOUNTER — Telehealth: Payer: Self-pay

## 2022-07-26 DIAGNOSIS — J4540 Moderate persistent asthma, uncomplicated: Secondary | ICD-10-CM | POA: Diagnosis not present

## 2022-07-26 NOTE — Telephone Encounter (Signed)
Patient notified through my chart.

## 2022-07-26 NOTE — Telephone Encounter (Signed)
-----   Message from Georgeanna Lea, MD sent at 07/26/2022 10:03 AM EDT ----- Chem-7 looks good, continue present management

## 2022-07-27 DIAGNOSIS — J3081 Allergic rhinitis due to animal (cat) (dog) hair and dander: Secondary | ICD-10-CM | POA: Diagnosis not present

## 2022-07-27 DIAGNOSIS — J3089 Other allergic rhinitis: Secondary | ICD-10-CM | POA: Diagnosis not present

## 2022-07-27 DIAGNOSIS — J301 Allergic rhinitis due to pollen: Secondary | ICD-10-CM | POA: Diagnosis not present

## 2022-07-27 DIAGNOSIS — J4550 Severe persistent asthma, uncomplicated: Secondary | ICD-10-CM | POA: Diagnosis not present

## 2022-08-07 ENCOUNTER — Encounter: Payer: Self-pay | Admitting: Podiatry

## 2022-08-07 ENCOUNTER — Ambulatory Visit: Payer: Medicare PPO | Admitting: Podiatry

## 2022-08-07 DIAGNOSIS — M7752 Other enthesopathy of left foot: Secondary | ICD-10-CM | POA: Diagnosis not present

## 2022-08-07 NOTE — Progress Notes (Signed)
She presents today for follow-up of her bursitis subsecond metatarsal phalangeal joint left foot.  She states that on 100% better after the plantar injection.  She went to the beach.  She would like to consider getting some orthotics to help prevent this from coming back.  Objective: No pain on physical exam no pain on palpation to the second metatarsophalangeal joint.  There is no erythema edema cellulitis drainage or odor.  Assessment: Well-healing capsulitis bursitis second metatarsal phalangeal joint left foot.  Plan: We will get her scheduled for orthotics.

## 2022-08-09 DIAGNOSIS — J4540 Moderate persistent asthma, uncomplicated: Secondary | ICD-10-CM | POA: Diagnosis not present

## 2022-08-23 DIAGNOSIS — J4540 Moderate persistent asthma, uncomplicated: Secondary | ICD-10-CM | POA: Diagnosis not present

## 2022-08-25 ENCOUNTER — Other Ambulatory Visit: Payer: Self-pay | Admitting: Family

## 2022-08-25 ENCOUNTER — Encounter: Payer: Self-pay | Admitting: Family

## 2022-09-05 ENCOUNTER — Ambulatory Visit (INDEPENDENT_AMBULATORY_CARE_PROVIDER_SITE_OTHER): Payer: Medicare PPO | Admitting: Podiatry

## 2022-09-05 DIAGNOSIS — M778 Other enthesopathies, not elsewhere classified: Secondary | ICD-10-CM

## 2022-09-05 DIAGNOSIS — G5782 Other specified mononeuropathies of left lower limb: Secondary | ICD-10-CM

## 2022-09-05 DIAGNOSIS — M109 Gout, unspecified: Secondary | ICD-10-CM

## 2022-09-05 DIAGNOSIS — M7752 Other enthesopathy of left foot: Secondary | ICD-10-CM

## 2022-09-05 NOTE — Progress Notes (Signed)
Patient presents today to be measured for custom orthotics   Patient was scanned for  1 pair of custom orthotics with the footmaxx scanner  Financial form signed

## 2022-09-06 DIAGNOSIS — J4540 Moderate persistent asthma, uncomplicated: Secondary | ICD-10-CM | POA: Diagnosis not present

## 2022-09-13 ENCOUNTER — Telehealth: Payer: Self-pay | Admitting: Podiatry

## 2022-09-13 NOTE — Telephone Encounter (Signed)
Lmom for pt to call back to schedule picking up orthotics    Charges need posted

## 2022-09-17 ENCOUNTER — Ambulatory Visit: Payer: Medicare PPO | Admitting: Family

## 2022-09-20 DIAGNOSIS — J4540 Moderate persistent asthma, uncomplicated: Secondary | ICD-10-CM | POA: Diagnosis not present

## 2022-09-25 ENCOUNTER — Ambulatory Visit: Payer: Medicare PPO | Admitting: Family

## 2022-09-25 ENCOUNTER — Telehealth (HOSPITAL_BASED_OUTPATIENT_CLINIC_OR_DEPARTMENT_OTHER): Payer: Self-pay

## 2022-09-25 VITALS — BP 151/66 | HR 81 | Temp 98.8°F | Resp 16 | Wt 194.0 lb

## 2022-09-25 DIAGNOSIS — R1013 Epigastric pain: Secondary | ICD-10-CM

## 2022-09-25 DIAGNOSIS — N390 Urinary tract infection, site not specified: Secondary | ICD-10-CM

## 2022-09-25 DIAGNOSIS — R109 Unspecified abdominal pain: Secondary | ICD-10-CM | POA: Diagnosis not present

## 2022-09-25 DIAGNOSIS — M549 Dorsalgia, unspecified: Secondary | ICD-10-CM | POA: Diagnosis not present

## 2022-09-25 DIAGNOSIS — R300 Dysuria: Secondary | ICD-10-CM | POA: Diagnosis not present

## 2022-09-25 DIAGNOSIS — R10A1 Flank pain, right side: Secondary | ICD-10-CM | POA: Insufficient documentation

## 2022-09-25 LAB — POC URINALSYSI DIPSTICK (AUTOMATED)
Bilirubin, UA: NEGATIVE
Blood, UA: NEGATIVE
Glucose, UA: NEGATIVE
Ketones, UA: NEGATIVE
Leukocytes, UA: NEGATIVE
Nitrite, UA: NEGATIVE
Protein, UA: NEGATIVE
Spec Grav, UA: <=1.005 — AB (ref 1.010–1.025)
Urobilinogen, UA: 0.2 E.U./dL
pH, UA: 7.5 (ref 5.0–8.0)

## 2022-09-25 MED ORDER — CEPHALEXIN 500 MG PO CAPS: 500.0000 mg | ORAL_CAPSULE | Freq: Two times a day (BID) | ORAL | 0 refills | Status: AC

## 2022-09-25 NOTE — Assessment & Plan Note (Signed)
Symptomatic UTI, though dip looks ok. Will begin empiric keflex and send urine for culture.  She has R sided CVAT which is concerning for pyelonephritis.  Check labs as ordered. She is also interested in a referral to a specialist for her recurrent UTI's.  Since she has had right sided flank pain with both uti's I will also get a renal US to evaluate for underlying structural abnormality that may be contributing. Refer to Urology for further evaluation.

## 2022-09-25 NOTE — Patient Instructions (Signed)
VISIT SUMMARY:  During your visit, we discussed your symptoms of upper abdominal pain, warm urine, and back discomfort. We suspect you may have a urinary tract infection (UTI) and possibly some inflammation in your kidney. We also discussed your ongoing asthma and your history of vertigo.  YOUR PLAN:  -SUSPECTED URINARY TRACT INFECTION: A urinary tract infection is an infection in any part of your urinary system. We've started you on Keflex, an antibiotic, to treat this. We're also sending your urine for further testing.  -RIGHT FLANK PAIN: Your back discomfort may be due to inflammation in your kidney. We're ordering blood tests to check your kidney function and to rule out other causes of your abdominal pain. We're also testing for pancreatitis, which is inflammation of the pancreas.  -ASTHMA: Asthma is a condition in which your airways narrow and swell and produce extra mucus. This can make breathing difficult and trigger coughing. We'll continue with your current management plan for this.  INSTRUCTIONS:  We're ordering a renal ultrasound to check for any structural abnormalities or drainage issues in your kidney. We also recommend that you get the Shingrix vaccine at the pharmacy. If your symptoms worsen or you don't improve in a week, please return for a follow-up. If your blood pressure remains high at your next visit, we may need to adjust your blood pressure medication.

## 2022-09-25 NOTE — Progress Notes (Signed)
Subjective:     Patient ID: Brandy Dunlap, female    DOB: [birth year], 72 y.o.   MRN: [id number]  Chief Complaint  Patient presents with   Back Pain   Abdominal Pain     Complains abdominal pain with nausea and some diarrhea    Back Pain Associated symptoms include abdominal pain. Pertinent negatives include no chest pain, fever, tingling, weakness or weight loss.  Abdominal Pain Associated symptoms include diarrhea and nausea. Pertinent negatives include no fever or weight loss.    Discussed the use of AI scribe software for clinical note transcription with the patient, who gave verbal consent to proceed.   72 year old female comes in today with complaints of lower back pain, epigastric abdominal paindiarrhea. Patient states that she is thinking that it is a kidney infection again and it is her right kidney. The diarrhea started today per patient. Patient does states that sitting helps with the pain but any movement does make the abdominal pain and back pain worse. Denies any pain during urination. Back pain is lower lumbar region and does not radiate. Denies blood in urine. Decreased appitate noted from patient due to pain. No changes to diet other wise.   The patient, with a history of recurrent urinary tract infections (UTIs), presents with new onset upper abdominal pain. The pain is described as a hard sensation, particularly noticeable at night, and is located in the epigastric area. The patient denies lower abdominal pain or constipation. She reports that her urine is warm, a new symptom for her, and she has a history of UTIs. The patient also reports right sided back discomfort, which she associates with previous kidney infections. She feels generally unwell, almost as if she has a virus. The patient has a history of asthma and reports a persistent cough that varies in severity, often worsening at night. She also has a history of vertigo, for which she is due to see an ENT  specialist. States that she has had diarrhea x 1 day.   Patient does states that sitting helps with the pain but any movement does make the abdominal pain and back pain worse.  Denies blood in urine. Decreased appitate noted from patient due to pain. No changes to diet other wise.    Health Maintenance Due  Topic Date Due   Hepatitis C Screening  Never done   Zoster Vaccines- Shingrix (1 of 2) 11/01/2000   DTaP/Tdap/Td (3 - Td or Tdap) 10/31/2020   COVID-19 Vaccine (4 - 2023-24 season) 10/27/2021    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   Essential hypertension 10/08/2007   Qualifier: Diagnosis of  By: Lovell Sheehan MD, Balinda Quails    GERD (gastroesophageal reflux disease)    History of kidney stones    per 09-02-2018 pre-op eval , suspected kidney stone , to [facility] CT abd on  7-9- for further eval , passive   Hypertension    Infectious disease 05/23/2015   Low back pain    Osteoarthritis    Pneumonia    Pre-diabetes    Prediabetes 07/09/2016   Referred otalgia of left ear 03/26/2016   Sinus trouble     Past Surgical History:  Procedure Laterality Date   CHOLECYSTECTOMY     COLONOSCOPY     COLONOSCOPY W/ BIOPSIES  10/06/2003   left knee replacement Left 05/2021   LUMBAR FUSION  2008   LUMBAR FUSION     2020   PARTIAL HIP ARTHROPLASTY Right 2009    hip replacement   TONSILLECTOMY     TOTAL HIP ARTHROPLASTY Left 09/10/2018   Procedure: TOTAL HIP  ARTHROPLASTY ANTERIOR APPROACH;  Surgeon: Ollen Gross, MD;  Location: [facility];  Service: Orthopedics;  Laterality: Left;   TOTAL KNEE ARTHROPLASTY Right 11/16/2013   Procedure: RIGHT TOTAL KNEE ARTHROPLASTY;  Surgeon: Dannielle Huh, MD;  Location: [facility];  Service: Orthopedics;  Laterality: Right;   VIDEO BRONCHOSCOPY Bilateral 12/05/2012   Procedure: VIDEO BRONCHOSCOPY WITHOUT FLUORO;  Surgeon: Leslye Peer, MD;  Location: [facility];  Service: Cardiopulmonary;  Laterality: Bilateral;    Family History  Problem Relation Age of Onset   COPD Mother    Heart disease Mother    Ovarian cancer Mother    Emphysema Mother    Hypertension Mother    Hyperlipidemia Mother    Heart Problems Mother    Stroke Mother    Thyroid disease Mother    Cancer Mother    COPD Father        died at age 39   Emphysema Father    Heart Problems Father    Heart disease Brother        stent with 90%   Breast cancer Maternal Aunt    Colon cancer Neg Hx    Esophageal cancer Neg Hx    Rectal cancer Neg Hx    Stomach cancer Neg Hx    Colon polyps Neg Hx     Social History   Socioeconomic History   Marital status: Single    Spouse name: Not on file   Number of children: 1   Years of education: Not on file   Highest education level: Bachelor's degree (e.g., BA, AB, BS)  Occupational History   Occupation: Magazine features editor: [employer]  Tobacco Use   Smoking status: Never    Passive exposure: Never   Smokeless tobacco: Never  Vaping Use   Vaping status: Never Used  Substance and Sexual Activity   Alcohol use: No   Drug use: No   Sexual activity: Not Currently    Partners: Male    Birth control/protection: Post-menopausal  Other Topics Concern   Not on file  Social History Narrative   Divorced   One daughter- lives locally and one grandson   Retired Runner, broadcasting/film/video,  Has masters degree   Enjoys reading, spending time with her grandson   Social Determinants of Health   Financial  Resource Strain: Low Risk  (05/15/2022)   Overall Financial Resource Strain (CARDIA)    Difficulty of Paying Living Expenses: Not hard at all  Food Insecurity: No Food Insecurity (05/15/2022)   Hunger Vital Sign    Worried About Running Out of Food in the Last Year: Never true    Ran Out of Food in the Last Year: Never true  Transportation Needs: No Transportation Needs (05/15/2022)  PRAPARE - Administrator, Civil Service (Medical): No    Lack of Transportation (Non-Medical): No  Physical Activity: Insufficiently Active (05/15/2022)   Exercise Vital Sign    Days of Exercise per Week: 3 days    Minutes of Exercise per Session: 40 min  Stress: No Stress Concern Present (05/15/2022)   [employer] of Occupational Health - Occupational Stress Questionnaire    Feeling of Stress : Not at all  Social Connections: Unknown (05/15/2022)   Social Connection and Isolation Panel [NHANES]    Frequency of Communication with Friends and Family: More than three times a week    Frequency of Social Gatherings with Friends and Family: Twice a week    Attends Religious Services: Patient declined    Database administrator or Organizations: Patient declined    Attends Banker Meetings: Not on file    Marital Status: Divorced  Intimate Partner Violence: Not At Risk (04/12/2022)   Humiliation, Afraid, Rape, and Kick questionnaire    Fear of Current or Ex-Partner: No    Emotionally Abused: No    Physically Abused: No    Sexually Abused: No    Outpatient Medications Prior to Visit  Medication Sig Dispense Refill   AIRSUPRA 90-80 MCG/ACT AERO SMARTSIG:2 Puff(s) Via Inhaler Daily PRN     amLODipine (NORVASC) 5 MG tablet Take 1 tablet (5 mg total) by mouth daily. 90 tablet 3   budesonide-formoterol (SYMBICORT) 160-4.5 MCG/ACT inhaler Inhale 2 puffs into the lungs 2 (two) times daily.     DEXILANT 60 MG capsule TAKE 1 CAPSULE BY MOUTH EVERY DAY 90 capsule 1   EPINEPHrine 0.3 mg/0.3  mL IJ SOAJ injection Inject 0.3 mg into the muscle as needed for anaphylaxis.     hydrochlorothiazide (MICROZIDE) 12.5 MG capsule Take 1 capsule (12.5 mg total) by mouth daily. 90 capsule 3   meclizine (ANTIVERT) 12.5 MG tablet Take 1 tablet (12.5 mg total) by mouth 3 (three) times daily as needed for dizziness. 30 tablet 0   montelukast (SINGULAIR) 10 MG tablet Take 1 tablet (10 mg total) by mouth at bedtime. 30 tablet 3   omalizumab (XOLAIR) 150 MG injection Inject 150 mg into the skin every 14 (fourteen) days.     traMADol (ULTRAM) 50 MG tablet Take 50-100 mg by mouth every 6 (six) hours as needed for pain.     No facility-administered medications prior to visit.    Allergies  Allergen Reactions   Augmentin [Amoxicillin-Pot Clavulanate] Diarrhea   Pantoprazole Sodium Palpitations    Review of Systems  Constitutional:  Negative for chills, fever and weight loss.  HENT:  Negative for congestion, ear pain and hearing loss.   Eyes:  Negative for pain and discharge.  Respiratory:  Positive for cough.   Cardiovascular:  Negative for chest pain and palpitations.  Gastrointestinal:  Positive for abdominal pain, diarrhea and nausea.       Diarrhea started today. Abdominal pain started 2-3 weeks ago. Pain is upper epi gastric area.   Genitourinary:  Positive for flank pain.       Right sided flank pain   Musculoskeletal:  Positive for back pain.  Skin:  Negative for itching and rash.  Neurological:  Negative for dizziness, tingling and weakness.  Psychiatric/Behavioral:  Negative for depression.        Objective:    Physical Exam Constitutional:      Appearance: Normal appearance.  HENT:     Mouth/Throat:  Mouth: Mucous membranes are moist.     Pharynx: Oropharynx is clear.  Cardiovascular:     Rate and Rhythm: Normal rate and regular rhythm.  Abdominal:     General: Bowel sounds are increased.     Tenderness: There is abdominal tenderness in the right upper quadrant,  epigastric area and left upper quadrant. There is right CVA tenderness. There is no left CVA tenderness.     Comments: Pain noted in the center upper epi gastri area with some tenderness noted on left and right side.   Skin:    General: Skin is warm and dry.  Neurological:     General: No focal deficit present.     Mental Status: She is alert.      BP (!) 151/66 (BP Location: Right Arm, Patient Position: Sitting, Cuff Size: Small)   Pulse 81   Temp 98.8 F (37.1 C) (Oral)   Resp 16   Wt 194 lb (88 kg)   SpO2 97%   BMI 34.37 kg/m  Wt Readings from Last 3 Encounters:  09/25/22 194 lb (88 kg)  07/02/22 191 lb (86.6 kg)  06/11/22 191 lb 8 oz (86.9 kg)       Assessment & Plan:   Problem List Items Addressed This Visit       Unprioritized   Right flank pain   Relevant Orders   US Renal   Recurrent UTI    Symptomatic UTI, though dip looks ok. Will begin empiric keflex and send urine for culture.  She has R sided CVAT which is concerning for pyelonephritis.  Check labs as ordered. She is also interested in a referral to a specialist for her recurrent UTI's.  Since she has had right sided flank pain with both uti's I will also get a renal US to evaluate for underlying structural abnormality that may be contributing. Refer to Urology for further evaluation.       Relevant Medications   cephALEXin (KEFLEX) 500 MG capsule   Other Relevant Orders   Ambulatory referral to Urology   Other Visit Diagnoses     Back pain, unspecified back location, unspecified back pain laterality, unspecified chronicity    -  Primary   Relevant Orders   POCT Urinalysis Dipstick (Automated) (Completed)   Dysuria       Relevant Orders   CBC w/Diff   Urine Culture   Epigastric pain       Relevant Orders   Comp Met (CMET)   Lipase       I am having Larry Sierras. Yuille start on cephALEXin. I am also having her maintain her omalizumab, budesonide-formoterol, traMADol, EPINEPHrine, montelukast,  meclizine, amLODipine, hydrochlorothiazide, Dexilant, and Airsupra.  Meds ordered this encounter  Medications   cephALEXin (KEFLEX) 500 MG capsule    Sig: Take 1 capsule (500 mg total) by mouth 2 (two) times daily for 7 days.    Dispense:  14 capsule    Refill:  0    Order Specific Question:   Supervising Provider    Answer:   Danise Edge A [[id number]]

## 2022-09-26 ENCOUNTER — Ambulatory Visit (HOSPITAL_BASED_OUTPATIENT_CLINIC_OR_DEPARTMENT_OTHER): Payer: Medicare PPO

## 2022-09-28 ENCOUNTER — Ambulatory Visit (HOSPITAL_BASED_OUTPATIENT_CLINIC_OR_DEPARTMENT_OTHER): Payer: Medicare PPO

## 2022-10-02 DIAGNOSIS — M48061 Spinal stenosis, lumbar region without neurogenic claudication: Secondary | ICD-10-CM | POA: Diagnosis not present

## 2022-10-03 ENCOUNTER — Ambulatory Visit (HOSPITAL_BASED_OUTPATIENT_CLINIC_OR_DEPARTMENT_OTHER)
Admission: RE | Admit: 2022-10-03 | Discharge: 2022-10-03 | Disposition: A | Discharge status: Home / Self Care | Payer: Medicare PPO | Source: Ambulatory Visit | Attending: Family | Admitting: Family

## 2022-10-03 DIAGNOSIS — R109 Unspecified abdominal pain: Secondary | ICD-10-CM | POA: Diagnosis not present

## 2022-10-03 DIAGNOSIS — N390 Urinary tract infection, site not specified: Secondary | ICD-10-CM | POA: Diagnosis not present

## 2022-10-05 DIAGNOSIS — J4540 Moderate persistent asthma, uncomplicated: Secondary | ICD-10-CM | POA: Diagnosis not present

## 2022-10-09 ENCOUNTER — Ambulatory Visit: Payer: Medicare PPO | Admitting: Urology

## 2022-10-09 ENCOUNTER — Ambulatory Visit (INDEPENDENT_AMBULATORY_CARE_PROVIDER_SITE_OTHER): Payer: Medicare PPO

## 2022-10-09 ENCOUNTER — Encounter: Payer: Self-pay | Admitting: Urology

## 2022-10-09 VITALS — BP 154/74 | HR 83 | Ht 63.0 in | Wt 190.0 lb

## 2022-10-09 DIAGNOSIS — N393 Stress incontinence (female) (male): Secondary | ICD-10-CM | POA: Diagnosis not present

## 2022-10-09 DIAGNOSIS — M778 Other enthesopathies, not elsewhere classified: Secondary | ICD-10-CM | POA: Diagnosis not present

## 2022-10-09 DIAGNOSIS — Z87440 Personal history of urinary (tract) infections: Secondary | ICD-10-CM

## 2022-10-09 DIAGNOSIS — N390 Urinary tract infection, site not specified: Secondary | ICD-10-CM | POA: Diagnosis not present

## 2022-10-09 DIAGNOSIS — G5782 Other specified mononeuropathies of left lower limb: Secondary | ICD-10-CM

## 2022-10-09 DIAGNOSIS — M7752 Other enthesopathy of left foot: Secondary | ICD-10-CM

## 2022-10-09 MED ORDER — SULFAMETHOXAZOLE-TRIMETHOPRIM 400-80 MG PO TABS: 1.0000 | ORAL_TABLET | Freq: Every evening | ORAL | 1 refills | Status: DC

## 2022-10-09 NOTE — Progress Notes (Signed)
Assessment: 1. Recurrent UTI   2. SUI (stress urinary incontinence, female)      Plan: Urine for culture today-will hold treatment pending culture results patient without current active UTI symptoms.  UTI prevention measures reviewed in detail today.  Patient was also given educational materials outlining recommendations. will begin low-dose antibiotic suppression with ss-bactrim FU 3-4 weeks  Female exam and cysto on FU  Chief Complaint: recurrent UTI  History of Present Illness:  Brandy Dunlap is a 72 y.o. female who is seen in consultation from Sandford Craze, NP for evaluation of recurrent UTI. Patient has had several documented E. coli UTIs over the last 18 months.  She has also had multiple other cultures that were negative.  These have been uncomplicated UTIs and not associated with any fever chills or other constitutional complaints.  No history of gross hematuria. Patient also reports that over the last year or so she has started having some stress urinary incontinence.  She is very active does a lot of exercise and yoga and notices some small-volume leakage with coughing and straining.  Patient had a normal renal ultrasound 10/03/2022. 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   Essential hypertension 10/08/2007   Qualifier: Diagnosis of  By: Lovell Sheehan MD, Balinda Quails    GERD (gastroesophageal reflux  disease)    History of kidney stones    per 09-02-2018 pre-op eval , suspected kidney stone , to [facility] CT abd on 7-9- for further eval , passive   Hypertension    Infectious disease 05/23/2015   Low back pain    Osteoarthritis    Pneumonia    Pre-diabetes    Prediabetes 07/09/2016   Referred otalgia of left ear 03/26/2016   Sinus trouble     Past Surgical History:  Past Surgical History:  Procedure Laterality Date   CHOLECYSTECTOMY     COLONOSCOPY     COLONOSCOPY W/ BIOPSIES  10/06/2003   left knee replacement Left 05/2021   LUMBAR FUSION  2008   LUMBAR FUSION     2020   PARTIAL HIP ARTHROPLASTY Right 2009    hip replacement   TONSILLECTOMY     TOTAL HIP ARTHROPLASTY Left 09/10/2018   Procedure: TOTAL HIP ARTHROPLASTY ANTERIOR APPROACH;  Surgeon: Ollen Gross, MD;  Location: [facility];  Service: Orthopedics;  Laterality: Left;   TOTAL KNEE ARTHROPLASTY Right 11/16/2013   Procedure: RIGHT TOTAL KNEE ARTHROPLASTY;  Surgeon: Dannielle Huh, MD;  Location: [facility];  Service: Orthopedics;  Laterality: Right;   VIDEO BRONCHOSCOPY Bilateral 12/05/2012   Procedure: VIDEO BRONCHOSCOPY WITHOUT FLUORO;  Surgeon: Leslye Peer, MD;  Location: [facility];  Service: Cardiopulmonary;  Laterality: Bilateral;    Allergies:  Allergies  Allergen Reactions   Augmentin [Amoxicillin-Pot Clavulanate] Diarrhea   Pantoprazole Sodium Palpitations    Family History:  Family History  Problem Relation Age of Onset   COPD Mother    Heart disease Mother    Ovarian cancer Mother    Emphysema Mother    Hypertension Mother    Hyperlipidemia Mother    Heart Problems Mother    Stroke Mother    Thyroid disease Mother    Cancer Mother    COPD Father        died at age 61   Emphysema Father    Heart Problems Father    Heart disease Brother        stent with 90%   Breast cancer Maternal Aunt    Colon cancer Neg Hx    Esophageal cancer Neg Hx    Rectal cancer Neg Hx    Stomach cancer Neg Hx     Colon polyps Neg Hx     Social History:  Social History   Tobacco Use   Smoking status: Never    Passive exposure: Never   Smokeless tobacco: Never  Vaping Use   Vaping status: Never Used  Substance Use Topics   Alcohol use: No   Drug use: No    Review of symptoms:  Constitutional:  Negative for unexplained weight loss, night sweats, fever, chills ENT:  Negative for nose bleeds, sinus pain, painful swallowing CV:  Negative for chest pain, shortness of breath, exercise intolerance, palpitations, loss of consciousness Resp:  Negative for cough, wheezing, shortness of breath GI:  Negative for nausea, vomiting, diarrhea, bloody stools GU:  Positives noted in HPI; otherwise negative for gross hematuria, dysuria, urinary incontinence Neuro:  Negative for seizures, poor balance, limb weakness, slurred speech Psych:  Negative for lack of energy, depression, anxiety Endocrine:  Negative for polydipsia, polyuria, symptoms of hypoglycemia (dizziness, hunger, sweating) Hematologic:  Negative for anemia, purpura, petechia, prolonged or excessive bleeding, use of anticoagulants  Allergic:  Negative for difficulty breathing or choking as a result of exposure to anything; no shellfish allergy; no allergic response (rash/itch) to materials, foods  Physical exam: BP (!) 154/74   Pulse 83   Ht 5\' 3"  (1.6 m)   Wt 190 lb (86.2 kg)   BMI 33.66 kg/m  GENERAL APPEARANCE:  Well appearing, well developed, well nourished, NAD   Results: UA is remarkable for microscopic hematuria and bacteriuria

## 2022-10-09 NOTE — Progress Notes (Signed)
Patient presents today to pick up custom molded foot orthotics, diagnosed with Capsulitis, Bursitis by Dr. Al Corpus.   Orthotics were dispensed and fit was satisfactory. Reviewed instructions for break-in and wear. Written instructions given to patient.  Patient will follow up as needed.   Brandy Dunlap Cped, CFo, CFm

## 2022-10-11 ENCOUNTER — Encounter: Payer: Self-pay | Admitting: Urology

## 2022-10-12 ENCOUNTER — Encounter: Payer: Self-pay | Admitting: Cardiology

## 2022-10-12 ENCOUNTER — Telehealth: Payer: Self-pay | Admitting: Urology

## 2022-10-12 NOTE — Telephone Encounter (Signed)
Pt saw the results in mychart and wanted to know if she still has an infection or not

## 2022-10-15 ENCOUNTER — Other Ambulatory Visit: Payer: Medicare PPO

## 2022-10-15 ENCOUNTER — Encounter: Payer: Self-pay | Admitting: Urology

## 2022-10-16 DIAGNOSIS — H8112 Benign paroxysmal vertigo, left ear: Secondary | ICD-10-CM | POA: Diagnosis not present

## 2022-10-18 ENCOUNTER — Telehealth: Payer: Self-pay | Admitting: Urology

## 2022-10-18 ENCOUNTER — Encounter: Payer: Self-pay | Admitting: Urology

## 2022-10-18 DIAGNOSIS — J4540 Moderate persistent asthma, uncomplicated: Secondary | ICD-10-CM | POA: Diagnosis not present

## 2022-10-18 MED ORDER — LEVOFLOXACIN 500 MG PO TABS: 500.0000 mg | ORAL_TABLET | Freq: Every day | ORAL | 0 refills | Status: AC

## 2022-10-18 NOTE — Telephone Encounter (Signed)
[facility] MDX reviewed. Will treat with Levaquin x 3 days then have patient begin daily SS Bactrim as previously prescribed.

## 2022-10-18 NOTE — Telephone Encounter (Signed)
Spoke with patient, she is aware of the results and the need for antibiotics. Verified that levaquin had been sent to pharmacy.

## 2022-10-18 NOTE — Telephone Encounter (Signed)
Due to no DPR pertaining to our office, no results were left on VM.   Msg for pt to call back to get results and the phone number was provided.

## 2022-10-25 ENCOUNTER — Other Ambulatory Visit: Payer: Self-pay | Admitting: Family

## 2022-10-25 DIAGNOSIS — H8112 Benign paroxysmal vertigo, left ear: Secondary | ICD-10-CM

## 2022-10-25 MED ORDER — MECLIZINE HCL 12.5 MG PO TABS
12.5000 mg | ORAL_TABLET | Freq: Three times a day (TID) | ORAL | 0 refills | Status: DC | PRN
Start: 2022-10-25 — End: 2023-11-13

## 2022-10-30 ENCOUNTER — Other Ambulatory Visit: Payer: Medicare PPO | Admitting: Urology

## 2022-10-31 ENCOUNTER — Other Ambulatory Visit: Payer: Medicare PPO | Admitting: Urology

## 2022-11-01 DIAGNOSIS — J3089 Other allergic rhinitis: Secondary | ICD-10-CM | POA: Diagnosis not present

## 2022-11-01 DIAGNOSIS — J3081 Allergic rhinitis due to animal (cat) (dog) hair and dander: Secondary | ICD-10-CM | POA: Diagnosis not present

## 2022-11-01 DIAGNOSIS — J019 Acute sinusitis, unspecified: Secondary | ICD-10-CM | POA: Diagnosis not present

## 2022-11-01 DIAGNOSIS — J4550 Severe persistent asthma, uncomplicated: Secondary | ICD-10-CM | POA: Diagnosis not present

## 2022-11-15 DIAGNOSIS — J4540 Moderate persistent asthma, uncomplicated: Secondary | ICD-10-CM | POA: Diagnosis not present

## 2022-11-21 ENCOUNTER — Ambulatory Visit: Payer: Medicare PPO | Admitting: Urology

## 2022-11-21 ENCOUNTER — Encounter: Payer: Self-pay | Admitting: Urology

## 2022-11-21 VITALS — BP 135/74 | HR 78 | Ht 63.0 in | Wt 189.0 lb

## 2022-11-21 DIAGNOSIS — N390 Urinary tract infection, site not specified: Secondary | ICD-10-CM

## 2022-11-21 DIAGNOSIS — Z87440 Personal history of urinary (tract) infections: Secondary | ICD-10-CM | POA: Diagnosis not present

## 2022-11-21 DIAGNOSIS — N393 Stress incontinence (female) (male): Secondary | ICD-10-CM

## 2022-11-21 DIAGNOSIS — N952 Postmenopausal atrophic vaginitis: Secondary | ICD-10-CM | POA: Diagnosis not present

## 2022-11-21 MED ORDER — ESTRADIOL 0.1 MG/GM VA CREA: TOPICAL_CREAM | VAGINAL | 5 refills | Status: DC

## 2022-11-21 MED ORDER — SULFAMETHOXAZOLE-TRIMETHOPRIM 400-80 MG PO TABS: 1.0000 | ORAL_TABLET | Freq: Every evening | ORAL | 0 refills | Status: DC

## 2022-11-21 NOTE — Addendum Note (Signed)
Addended by: Carolin Coy on: 11/21/2022 02:20 PM   Modules accepted: Orders

## 2022-11-21 NOTE — Progress Notes (Signed)
Assessment: 1. Recurrent UTI   2. SUI (stress urinary incontinence, female)   3. Atrophic vaginitis     Plan: Today had a long discussion with the patient regarding her recurrent UTI and mild stress urinary incontinence.  She does have significant postmenopausal atrophic vaginitis. I have recommended starting vaginal estrogen cream--Rx Estrace cream  Patient will also use urinary probiotic with lactobacillus  Continue low-dose suppression with Bactrim for the next few months  Follow-up 4 months for recheck  Total time spent providing care today = 35 minutes  Chief Complaint: No chief complaint on file.   HPI: Brandy Dunlap is a 72 y.o. female who presents for continued evaluation of recurrent urinary tract infection. Patient also has mild stress urinary incontinence.  She has some small-volume leakage with coughing and when she exercises.  She does not wear pads except during exercise. No current UTI symptoms  Please see my note 10/09/2022 at the time of initial visit for detailed history. At that time she was relatively asymptomatic and her urine culture grew multiple organisms.  She had a follow-up resolve MDX that did grow Enterococcus and E. coli for which she was treated with Levaquin and started on low-dose Bactrim suppression.  Portions of the above documentation were copied from a prior visit for review purposes only.  Allergies: Allergies  Allergen Reactions   Augmentin [Amoxicillin-Pot Clavulanate] Diarrhea   Pantoprazole Sodium Palpitations    PMH: Past Medical History:  Diagnosis Date   Acute pain of left knee 10/02/2019   Allergic rhinitis 06/29/2009   Qualifier: Diagnosis of  By: Rita Ohara     Allergic rhinitis due to animal (cat) (dog) hair and dander 07/07/2020   Allergy    Asthma    Atypical chest pain 04/18/2017   B12 deficiency    Back pain    Body mass index (BMI) 35.0-35.9, adult 05/01/2019   Bronchitis    hx   Chronic allergic  conjunctivitis 07/07/2020   Chronic neck pain 03/01/2015   Colon polyp 10/06/2003   Depressive disorder 10/01/2016   PT DENIES   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   Essential hypertension 10/08/2007   Qualifier: Diagnosis of  By: Lovell Sheehan MD, Balinda Quails    GERD (gastroesophageal reflux disease)    History of kidney stones    per 09-02-2018 pre-op eval , suspected kidney stone , to [facility] CT abd on 7-9- for further eval , passive   Hypertension    Infectious disease 05/23/2015   Low back pain    Osteoarthritis    Pneumonia    Pre-diabetes    Prediabetes 07/09/2016   Referred otalgia of left ear 03/26/2016   Sinus trouble     PSH: Past Surgical History:  Procedure Laterality Date   CHOLECYSTECTOMY     COLONOSCOPY     COLONOSCOPY W/ BIOPSIES  10/06/2003   left knee replacement Left 05/2021   LUMBAR FUSION  2008   LUMBAR FUSION     2020   PARTIAL HIP ARTHROPLASTY Right 2009    hip replacement   TONSILLECTOMY     TOTAL HIP ARTHROPLASTY Left 09/10/2018   Procedure: TOTAL HIP ARTHROPLASTY ANTERIOR APPROACH;  Surgeon: Ollen Gross, MD;  Location: [facility];  Service: Orthopedics;  Laterality: Left;   TOTAL KNEE ARTHROPLASTY Right 11/16/2013   Procedure: RIGHT TOTAL KNEE ARTHROPLASTY;  Surgeon: Dannielle Huh, MD;  Location: [facility];  Service: Orthopedics;  Laterality: Right;   VIDEO BRONCHOSCOPY Bilateral 12/05/2012   Procedure: VIDEO BRONCHOSCOPY WITHOUT FLUORO;  Surgeon: Leslye Peer, MD;  Location: [facility];  Service: Cardiopulmonary;  Laterality: Bilateral;    SH: Social History   Tobacco Use   Smoking status: Never    Passive exposure: Never   Smokeless tobacco: Never  Vaping Use   Vaping status: Never Used  Substance Use Topics   Alcohol use: No   Drug use: No    ROS: Constitutional:  Negative for fever, chills, weight loss CV: Negative for chest pain, previous MI, hypertension Respiratory:  Negative for shortness of breath, wheezing, sleep apnea, frequent cough GI:   Negative for nausea, vomiting, bloody stool, GERD  PE: BP 135/74   Pulse 78   Ht 5\' 3"  (1.6 m)   Wt 189 lb (85.7 kg)   BMI 33.48 kg/m  GENERAL APPEARANCE:  Well appearing, well developed, well nourished, NAD  GU: Prominent atrophic vaginitis.  Small urethral carbuncle.  No palpable mass.  No evidence of prolapse.  Patient does have some urethral hypermobility and small-volume leakage with coughing

## 2022-11-26 ENCOUNTER — Encounter: Payer: Self-pay | Admitting: Family

## 2022-11-26 DIAGNOSIS — H8110 Benign paroxysmal vertigo, unspecified ear: Secondary | ICD-10-CM

## 2022-11-28 ENCOUNTER — Encounter: Payer: Self-pay | Admitting: Physical Therapy

## 2022-11-28 ENCOUNTER — Other Ambulatory Visit: Payer: Self-pay

## 2022-11-28 ENCOUNTER — Ambulatory Visit (INDEPENDENT_AMBULATORY_CARE_PROVIDER_SITE_OTHER): Payer: Medicare PPO

## 2022-11-28 ENCOUNTER — Ambulatory Visit: Payer: Medicare PPO | Attending: Family | Admitting: Physical Therapy

## 2022-11-28 ENCOUNTER — Ambulatory Visit: Payer: Medicare PPO | Admitting: Physical Therapy

## 2022-11-28 DIAGNOSIS — H8111 Benign paroxysmal vertigo, right ear: Secondary | ICD-10-CM | POA: Diagnosis not present

## 2022-11-28 DIAGNOSIS — Z23 Encounter for immunization: Secondary | ICD-10-CM

## 2022-11-28 DIAGNOSIS — H8110 Benign paroxysmal vertigo, unspecified ear: Secondary | ICD-10-CM | POA: Insufficient documentation

## 2022-11-28 DIAGNOSIS — R42 Dizziness and giddiness: Secondary | ICD-10-CM | POA: Diagnosis not present

## 2022-11-28 NOTE — Therapy (Addendum)
OUTPATIENT PHYSICAL THERAPY VESTIBULAR EVALUATION/Discharge     Patient Name: Brandy Dunlap MRN: [id number] DOB:03-May-1950, 72 y.o., female Today's Date: 11/28/2022  PCP: Sandford Craze, NP  REFERRING PROVIDER: Sandford Craze, NP   PT End of Session - 11/28/22 1113     Visit Number 1    Date for PT Re-Evaluation 12/26/22    Authorization Type [employer] MCR    PT Start Time 1105    PT Stop Time 1145    PT Time Calculation (min) 40 min    Activity Tolerance Patient tolerated treatment well    Behavior During Therapy WFL for tasks assessed/performed               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   Essential hypertension 10/08/2007   Qualifier: Diagnosis of  By: Lovell Sheehan MD, Balinda Quails    GERD (gastroesophageal reflux disease)    History of kidney stones    per 09-02-2018 pre-op eval , suspected kidney stone , to [facility] CT abd on 7-9- for further eval , passive   Hypertension    Infectious disease 05/23/2015   Low back pain    Osteoarthritis    Pneumonia    Pre-diabetes    Prediabetes 07/09/2016   Referred otalgia of left ear 03/26/2016   Sinus trouble    Past Surgical History:  Procedure Laterality Date   CHOLECYSTECTOMY     COLONOSCOPY     COLONOSCOPY W/ BIOPSIES  10/06/2003   left knee replacement Left 05/2021   LUMBAR FUSION  2008   LUMBAR FUSION     2020   PARTIAL HIP ARTHROPLASTY Right 2009    hip replacement    TONSILLECTOMY     TOTAL HIP ARTHROPLASTY Left 09/10/2018   Procedure: TOTAL HIP ARTHROPLASTY ANTERIOR APPROACH;  Surgeon: Ollen Gross, MD;  Location: [facility];  Service: Orthopedics;  Laterality: Left;   TOTAL KNEE ARTHROPLASTY Right 11/16/2013   Procedure: RIGHT TOTAL KNEE ARTHROPLASTY;  Surgeon: Dannielle Huh, MD;  Location: [facility];  Service: Orthopedics;  Laterality: Right;   VIDEO BRONCHOSCOPY Bilateral 12/05/2012   Procedure: VIDEO BRONCHOSCOPY WITHOUT FLUORO;  Surgeon: Leslye Peer, MD;  Location: [facility];  Service: Cardiopulmonary;  Laterality: Bilateral;   Patient Active Problem List   Diagnosis Date Noted   Recurrent UTI 09/25/2022   Right flank pain 09/25/2022   Strep throat 05/18/2022   COVID-19 virus infection 01/31/2022   Thrombosis of popliteal vein (HCC) 09/18/2021   Urinary urgency 08/01/2021   Benign paroxysmal positional vertigo 05/08/2021   Acute serous otitis media of left ear 05/08/2021   Preoperative examination 04/05/2021   Urine frequency 03/10/2021   Dyslipidemia 10/04/2020   Allergic rhinitis due to animal (cat) (dog) hair and dander 07/07/2020   Allergic rhinitis due to pollen 07/07/2020   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   Moderate persistent asthma, uncomplicated 07/07/2020  Pre-diabetes    History of kidney stones    Bronchitis with bronchospasm    IDA (iron deficiency anemia) 07/06/2020   Spinal stenosis, lumbar region without neurogenic claudication 06/04/2019   Body mass index (BMI) 35.0-35.9, adult 05/01/2019   Degenerative scoliosis in adult patient 05/01/2019   Asthma 04/18/2017   Cervical spondylosis without myelopathy 09/23/2015   Infectious disease 05/23/2015   Morbid obesity (HCC) 04/25/2015   Preop cardiovascular exam 10/25/2014   History of total knee arthroplasty 11/16/2013   Osteoarthrosis, localized, primary, knee, right 11/06/2013   Cough 06/26/2012   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   Cobalamin  deficiency 09/21/2009   Allergic rhinitis 06/29/2009   Arthralgia of left temporomandibular joint 03/19/2008   Essential hypertension 10/08/2007   OSTEOARTHRITIS 10/08/2007   Osteoarthritis of hip 10/08/2007   Low back pain 07/24/2007   Gastroesophageal reflux disease 07/24/2007   Colon polyp 10/06/2003    ONSET DATE: ~ November 14, 2022  REFERRING DIAG: H81.10 (ICD-10-CM) - Benign paroxysmal positional vertigo, unspecified laterality   THERAPY DIAG:  BPPV (benign paroxysmal positional vertigo), right  Dizziness and giddiness  Rationale for Evaluation and Treatment Rehabilitation  SUBJECTIVE:   SUBJECTIVE STATEMENT: Its been different this time, I woke up in September in middle of night spinning,  Spinning on R side this time.  I went to ENT and he didn't think any could do to prevent the vertigo from happening again.  Frustrated it seems to happen every 6 months like clockwork.  Only thing I can think of that could cause this was a sinus infection.    PERTINENT HISTORY: R TKA, L TKA, R hearing loss, arthritis neck, LBP, lumbar fusions.  Recurrent vertigo, last seen September 2023 and March 2023 for BPPV.   PAIN:  Are you having pain? No  PRECAUTIONS: None  WEIGHT BEARING RESTRICTIONS No  FALLS: Has patient fallen in last 6 months? No  LIVING ENVIRONMENT:  PLOF: Independent  PATIENT GOALS stop the spinning  OBJECTIVE:   DIAGNOSTIC FINDINGS: NA  COGNITION: Overall cognitive status: Within functional limits for tasks assessed   SENSATION: Not tested  EDEMA:  NA  POSTURE: rounded shoulders and forward head   Cervical ROM:    Active AROM (Deg) 11/28/22  Flexion 35  Extension 30   Right lateral flexion   Left lateral flexion   Right rotation 70  Left rotation 65  (Blank rows = not tested)  STRENGTH: 5/5 UE strength   LOWER EXTREMITY MMT:  5/5 LE strength  GAIT: Gait pattern: WFL Distance walked: 36' Assistive device utilized: None Level of  assistance: Complete Independence Comments: no deviation  FUNCTIONAL TESTs:    PATIENT SURVEYS:  DHI 18%   VESTIBULAR ASSESSMENT   GENERAL OBSERVATION: enters independently in no apparent distress.      SYMPTOM BEHAVIOR:   Subjective history: see above   Non-Vestibular symptoms:  none   Type of dizziness: Imbalance (Disequilibrium) and Spinning/Vertigo   Frequency: rolling on R, sitting up the very first time   Duration: few seconds    Aggravating factors:  rolling over   Relieving factors:  holding still   Progression of symptoms:  improving   OCULOMOTOR EXAM:   Ocular Alignment: normal   Ocular ROM: No Limitations   Spontaneous Nystagmus: absent   Gaze-Induced Nystagmus: age appropriate nystagmus at end range   Smooth Pursuits: saccades especially with horizontal eye movements.    Saccades: hypometric/undershoots       VESTIBULAR - OCULAR REFLEX:  Slow VOR: Normal   VOR Cancellation: Normal   Head-Impulse Test: HIT Right: negative HIT Left: negative   Dynamic Visual Acuity:  NT    POSITIONAL TESTING: Right Dix-Hallpike: upbeating, right nystagmus Left Dix-Hallpike: no nystagmus    MOTION SENSITIVITY:    Motion Sensitivity Quotient  Intensity: 0 = none, 1 = Lightheaded, 2 = Mild, 3 = Moderate, 4 = Severe, 5 = Vomiting  Intensity  1. Sitting to supine   2. Supine to L side 0  3. Supine to R side 3  4. Supine to sitting   5. L Hallpike-Dix 0  6. Up from L  2  7. R Hallpike-Dix 3  8. Up from R  3  9. Sitting, head  tipped to L knee   10. Head up from L  knee   11. Sitting, head  tipped to R knee   12. Head up from R  knee   13. Sitting head turns x5 0  14.Sitting head nods x5 0  15. In stance, 180  turn to L    16. In stance, 180  turn to R     OTHOSTATICS: NT    VESTIBULAR TREATMENT:  Canalith Repositioning: R Epley x 2   Comment: decreased symptoms, no dizziness rolling to right side, cessation of headache   PATIENT  EDUCATION: Education details: findings POC, post-epley precautions Person educated: Patient Education method: Explanation and Demonstration Education comprehension: verbalized understanding   GOALS: Goals reviewed with patient? Yes  SHORT TERM GOALS: Target date: 12/12/2022   Patient will reports solution of dizziness symptoms when rolling in bed.  Baseline:  Goal status: INITIAL  LONG TERM GOALS: Target date: 12/26/2022    Patient will report complete resolution of vertigo.  Baseline:  Goal status: INITIAL  2.  Patient will report 6 points or less on DHI Baseline:  18% Goal status: INITIAL  3.  Patient will demonstrate safety with gait and balance.  Baseline:  Goal status: INITIAL    ASSESSMENT:  CLINICAL IMPRESSION: Brandy Dunlap  is a 72 y.o. female who was seen today for physical therapy evaluation and treatment for recurraent vertigo.  She has been treated for BPPV 3 previous episodes, last episode was about 6 months ago.  Today she reports dizziness rolling to R side.  She was positive to R posterior canalithiaisis today, but after Epley was performed 2x, symptoms completely resolved.  Cautioned to avoid large head movements, keep head propped up slightly today, and be careful as she would feel slightly off balance due to canalith repositioning today.  She is out of town so will follow up next week and check horizontal canals as well.   Ermalene Searing Liebler would benefit from skilled therapy to resolve BPPV, decrease dizziness, and decrease risk of falls.     OBJECTIVE IMPAIRMENTS decreased activity tolerance, decreased balance, decreased ROM, dizziness, postural dysfunction, and pain.   ACTIVITY LIMITATIONS bending, squatting, bed mobility, and dressing  PARTICIPATION LIMITATIONS: cleaning and community activity  PERSONAL FACTORS Past/current experiences and 1-2 comorbidities: recurrent vertigo, R hearing loss, cervical arthritis, lumbar fusion, LBP  are also  affecting patient's functional outcome.   REHAB POTENTIAL: Excellent  CLINICAL DECISION MAKING: Stable/uncomplicated  EVALUATION COMPLEXITY: Low   PLAN: PT FREQUENCY: 1-2x/week  PT DURATION: 4 weeks  PLANNED INTERVENTIONS: Therapeutic exercises, Therapeutic activity, Neuromuscular re-education, Balance training, Gait training, Patient/Family education, Self Care, Joint mobilization, Vestibular training, Canalith repositioning, Dry Needling, Electrical stimulation, Spinal mobilization, Cryotherapy, Moist heat, Ultrasound, Manual therapy,  and Re-evaluation  PLAN FOR NEXT SESSION: reassess canals, balance testing.    Jena Gauss, PT, DPT  11/28/2022, 12:09 PM   PHYSICAL THERAPY DISCHARGE SUMMARY  Visits from Start of Care: 1  Current functional level related to goals / functional outcomes: Dizziness resolved   Remaining deficits: NA   Education / Equipment: NA  Plan: Patient agrees to discharge.  Patient did not return after evaluation since dizziness resolved.    Jena Gauss, PT  02/06/2023 2:58 PM

## 2022-12-10 ENCOUNTER — Ambulatory Visit: Payer: Medicare PPO | Admitting: Physical Therapy

## 2022-12-13 DIAGNOSIS — J4540 Moderate persistent asthma, uncomplicated: Secondary | ICD-10-CM | POA: Diagnosis not present

## 2022-12-27 DIAGNOSIS — J4540 Moderate persistent asthma, uncomplicated: Secondary | ICD-10-CM | POA: Diagnosis not present

## 2023-01-08 ENCOUNTER — Other Ambulatory Visit: Payer: Self-pay | Admitting: Family

## 2023-01-11 DIAGNOSIS — J4540 Moderate persistent asthma, uncomplicated: Secondary | ICD-10-CM | POA: Diagnosis not present

## 2023-01-16 ENCOUNTER — Telehealth (INDEPENDENT_AMBULATORY_CARE_PROVIDER_SITE_OTHER): Payer: Medicare PPO | Admitting: Family

## 2023-01-16 DIAGNOSIS — J209 Acute bronchitis, unspecified: Secondary | ICD-10-CM | POA: Diagnosis not present

## 2023-01-16 MED ORDER — ALBUTEROL SULFATE HFA 108 (90 BASE) MCG/ACT IN AERS: 2.0000 | INHALATION_SPRAY | Freq: Four times a day (QID) | RESPIRATORY_TRACT | Status: DC | PRN

## 2023-01-16 MED ORDER — BENZONATATE 100 MG PO CAPS: 100.0000 mg | ORAL_CAPSULE | Freq: Three times a day (TID) | ORAL | 0 refills | Status: DC | PRN

## 2023-01-16 MED ORDER — PREDNISONE 10 MG PO TABS: ORAL_TABLET | ORAL | 0 refills | Status: DC

## 2023-01-16 MED ORDER — AZITHROMYCIN 250 MG PO TABS: ORAL_TABLET | ORAL | 0 refills | Status: AC

## 2023-01-16 NOTE — Assessment & Plan Note (Signed)
  Persistent cough with thick clear sputum, wheezing, and fatigue for two weeks. No fever. Current medications include Symbicort and Singulair. -Start Prednisone course. -Continue Albuterol every 6 hours routinely. -Continue Symbicort two puffs twice a day. -Order Z-Pak and send to [facility]. -Order chest x-ray to rule out pneumonia. -Consider Tessalon for cough relief.  -Advise to get COVID-19 booster shot at any major pharmacy or at the med center downstairs.  Follow-up If symptoms worsen, develop a fever, or no improvement in 3-4 days, patient to contact office.

## 2023-01-16 NOTE — Patient Instructions (Signed)
VISIT SUMMARY:  You were seen today for a persistent cough and wheezing that has been troubling you for the past couple of weeks. You have been experiencing severe coughing, especially at night, and producing thick, clear phlegm. You have been using over-the-counter medications and your prescribed asthma medications, but with little relief. You have not had a fever and have not received your COVID booster shot this year.  YOUR PLAN:  -BRONCHITIS: Bronchitis is an inflammation of the bronchial tubes, which carry air to your lungs. This condition can cause coughing, wheezing, and the production of thick mucus. We are starting you on a course of Prednisone to reduce inflammation. Continue using Albuterol every 6 hours, Symbicort two puffs twice a day, and we have ordered a Z-Pak (antibiotic) for you, which will be sent to [facility] [facility]. A chest x-ray has been ordered to rule out pneumonia. We may also consider Tessalon for additional cough relief.  -COVID-19 VACCINATION: You have not yet received your COVID-19 booster shot this year. It is important to stay up-to-date with your vaccinations to protect yourself and others. Please get your COVID-19 booster shot at any major pharmacy or at the medical center downstairs.  INSTRUCTIONS:  If your symptoms worsen, you develop a fever, or there is no improvement in 3-4 days, please contact our office.

## 2023-01-16 NOTE — Progress Notes (Addendum)
Subjective:   Video Visit   Patient ID: Brandy Dunlap, female    DOB: April 03, 1950, 72 y.o.   MRN: [id number]  Chief Complaint  Patient presents with   Cough    Patient reports persistent cough for about 2 weeks. OTC meds not working.    Patient location: Home. Patient and provider in visit Provider location: Home   I discussed the limitations of evaluation and management by telemedicine and the availability of in person appointments. The patient expressed understanding and agreed to proceed.   Visit Date: 01/16/2023   Today's healthcare provider: Lemont Fillers, NP       Discussed the use of AI scribe software for clinical note transcription with the patient, who gave verbal consent to proceed.  History of Present Illness     The patient, with a history of asthma, presents with a persistent cough and wheezing for the past couple of weeks. She describes the cough as severe, to the point of almost inducing vomiting, and it is particularly troublesome at night, disrupting her sleep. She has been producing thick, clear phlegm which she describes as almost choking her when it comes up. She has been managing her symptoms with over-the-counter medications including Mucinex and Advil, but with little relief. She has not had a fever. She has been using her prescribed Symbicort and Singulair, as well as a rescue inhaler. She has not yet received her COVID booster shot this year. Health Maintenance Due  Topic Date Due   Hepatitis C Screening  Never done   Zoster Vaccines- Shingrix (1 of 2) 11/01/2000   DTaP/Tdap/Td (3 - Td or Tdap) 10/31/2020   COVID-19 Vaccine (4 - 2023-24 season) 10/28/2022    Past Medical History:  Diagnosis Date   Acute pain of left knee 10/02/2019   Allergic rhinitis 06/29/2009   Qualifier: Diagnosis of  By: Rita Ohara     Allergic rhinitis due to animal (cat) (dog) hair and dander 07/07/2020   Allergy    Asthma    Atypical chest pain 04/18/2017    B12 deficiency    Back pain    Body mass index (BMI) 35.0-35.9, adult 05/01/2019   Bronchitis    hx   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   Chronic neck pain 03/01/2015   Colon polyp 10/06/2003   Depressive disorder 10/01/2016   PT DENIES   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   Essential hypertension 10/08/2007   Qualifier: Diagnosis of  By: Lovell Sheehan MD, Balinda Quails    GERD (gastroesophageal reflux disease)    History of kidney stones    per 09-02-2018 pre-op eval , suspected kidney stone , to [facility] CT abd on 7-9- for further eval , passive   Hypertension    Infectious disease 05/23/2015   Low back pain    Osteoarthritis    Pneumonia    Pre-diabetes    Prediabetes 07/09/2016   Referred otalgia of left ear 03/26/2016   Sinus trouble     Past Surgical History:  Procedure Laterality Date   CHOLECYSTECTOMY     COLONOSCOPY     COLONOSCOPY W/ BIOPSIES  10/06/2003   left knee replacement Left 05/2021   LUMBAR FUSION  2008   LUMBAR FUSION     2020   PARTIAL HIP ARTHROPLASTY Right 2009    hip replacement   TONSILLECTOMY     TOTAL HIP ARTHROPLASTY Left 09/10/2018   Procedure: TOTAL HIP ARTHROPLASTY ANTERIOR APPROACH;  Surgeon: Ollen Gross, MD;  Location: [facility];  Service:  Orthopedics;  Laterality: Left;   TOTAL KNEE ARTHROPLASTY Right 11/16/2013   Procedure: RIGHT TOTAL KNEE ARTHROPLASTY;  Surgeon: Dannielle Huh, MD;  [facility];  Service: Orthopedics;  Laterality: Right;   VIDEO BRONCHOSCOPY Bilateral 12/05/2012   Procedure: VIDEO BRONCHOSCOPY WITHOUT FLUORO;  Surgeon: Leslye Peer, MD;  [facility];  Service: Cardiopulmonary;  Laterality: Bilateral;    Family History  Problem Relation Age of Onset   COPD Mother    Heart disease Mother    Ovarian cancer Mother    Emphysema Mother    Hypertension Mother    Hyperlipidemia Mother    Heart Problems Mother    Stroke Mother    Thyroid disease Mother    Cancer Mother    COPD Father        died at age 58    Emphysema Father    Heart Problems Father    Heart disease Brother        stent with 90%   Breast cancer Maternal Aunt    Colon cancer Neg Hx    Esophageal cancer Neg Hx    Rectal cancer Neg Hx    Stomach cancer Neg Hx    Colon polyps Neg Hx     Social History   Socioeconomic History   Marital status: Single    Spouse name: Not on file   Number of children: 1   Years of education: Not on file   Highest education level: Bachelor's degree (e.g., BA, AB, BS)  Occupational History   Occupation: Magazine features editor: [employer]  Tobacco Use   Smoking status: Never    Passive exposure: Never   Smokeless tobacco: Never  Vaping Use   Vaping status: Never Used  Substance and Sexual Activity   Alcohol use: No   Drug use: No   Sexual activity: Not Currently    Partners: Male    Birth control/protection: Post-menopausal  Other Topics Concern   Not on file  Social History Narrative   Divorced   One daughter- lives locally and one grandson   Retired Runner, broadcasting/film/video,  Has masters degree   Enjoys reading, spending time with her grandson   Social Determinants of Health   Financial Resource Strain: Low Risk  (01/14/2023)   Overall Financial Resource Strain (CARDIA)    Difficulty of Paying Living Expenses: Not hard at all  Food Insecurity: No Food Insecurity (01/14/2023)   Hunger Vital Sign    Worried About Running Out of Food in the Last Year: Never true    Ran Out of Food in the Last Year: Never true  Transportation Needs: No Transportation Needs (01/14/2023)   PRAPARE - Administrator, Civil Service (Medical): No    Lack of Transportation (Non-Medical): No  Physical Activity: Insufficiently Active (01/14/2023)   Exercise Vital Sign    Days of Exercise per Week: 3 days    Minutes of Exercise per Session: 40 min  Stress: No Stress Concern Present (01/14/2023)   [employer] of Occupational Health - Occupational Stress Questionnaire    Feeling of  Stress : Not at all  Social Connections: Unknown (01/14/2023)   Social Connection and Isolation Panel [NHANES]    Frequency of Communication with Friends and Family: Twice a week    Frequency of Social Gatherings with Friends and Family: Once a week    Attends Religious Services: Patient declined    Database administrator or Organizations: Yes    Attends [employer]  or Organization Meetings: More than 4 times per year    Marital Status: Divorced  Intimate Partner Violence: Not At Risk (04/12/2022)   Humiliation, Afraid, Rape, and Kick questionnaire    Fear of Current or Ex-Partner: No    Emotionally Abused: No    Physically Abused: No    Sexually Abused: No    Outpatient Medications Prior to Visit  Medication Sig Dispense Refill   AIRSUPRA 90-80 MCG/ACT AERO SMARTSIG:2 Puff(s) Via Inhaler Daily PRN     budesonide-formoterol (SYMBICORT) 160-4.5 MCG/ACT inhaler Inhale 2 puffs into the lungs 2 (two) times daily.     DEXILANT 60 MG capsule TAKE 1 CAPSULE BY MOUTH EVERY DAY 90 capsule 1   EPINEPHrine 0.3 mg/0.3 mL IJ SOAJ injection Inject 0.3 mg into the muscle as needed for anaphylaxis.     estradiol (ESTRACE) 0.1 MG/GM vaginal cream Place a pea-sized amount on fingertip and apply 3 times weekly to vaginal region and urethra as directed 42.5 g 5   meclizine (ANTIVERT) 12.5 MG tablet Take 1 tablet (12.5 mg total) by mouth 3 (three) times daily as needed for dizziness. 30 tablet 0   montelukast (SINGULAIR) 10 MG tablet Take 1 tablet (10 mg total) by mouth at bedtime. 30 tablet 3   omalizumab (XOLAIR) 150 MG injection Inject 150 mg into the skin every 14 (fourteen) days.     sulfamethoxazole-trimethoprim (BACTRIM) 400-80 MG tablet Take 1 tablet by mouth at bedtime. 90 tablet 0   traMADol (ULTRAM) 50 MG tablet Take 50-100 mg by mouth every 6 (six) hours as needed for pain.     amLODipine (NORVASC) 5 MG tablet Take 1 tablet (5 mg total) by mouth daily. 90 tablet 3   hydrochlorothiazide (MICROZIDE) 12.5  MG capsule Take 1 capsule (12.5 mg total) by mouth daily. 90 capsule 3   No facility-administered medications prior to visit.    Allergies  Allergen Reactions   Augmentin [Amoxicillin-Pot Clavulanate] Diarrhea   Pantoprazole Sodium Palpitations    Review of Systems  Respiratory:  Positive for cough.    See HPI    Objective:    Physical Exam   There were no vitals taken for this visit. Wt Readings from Last 3 Encounters:  11/21/22 189 lb (85.7 kg)  10/09/22 190 lb (86.2 kg)  09/25/22 194 lb (88 kg)   Gen: Awake, alert, no acute distress Resp: Breathing is even and non-labored Psych: calm/pleasant demeanor Neuro: Alert and Oriented x 3, + facial symmetry, speech is clear.  Assessment & Plan:   Problem List Items Addressed This Visit       Unprioritized   Bronchitis with bronchospasm - Primary     Persistent cough with thick clear sputum, wheezing, and fatigue for two weeks. No fever. Current medications include Symbicort and Singulair. -Start Prednisone course. -Continue Albuterol every 6 hours routinely. -Continue Symbicort two puffs twice a day. -Order Z-Pak and send to [facility]. -Order chest x-ray to rule out pneumonia. -Consider Tessalon for cough relief.  -Advise to get COVID-19 booster shot at any major pharmacy or at the med center downstairs.  Follow-up If symptoms worsen, develop a fever, or no improvement in 3-4 days, patient to contact office.      Relevant Medications   predniSONE (DELTASONE) 10 MG tablet   azithromycin (ZITHROMAX) 250 MG tablet   Other Relevant Orders   DG Chest 2 View    I am having Larry Sierras. Kettles start on predniSONE, azithromycin, benzonatate, and albuterol. I am also  having her maintain her omalizumab, budesonide-formoterol, traMADol, EPINEPHrine, montelukast, amLODipine, hydrochlorothiazide, Airsupra, meclizine, sulfamethoxazole-trimethoprim, estradiol, and Dexilant.  Meds ordered this encounter  Medications    predniSONE (DELTASONE) 10 MG tablet    Sig: 4 tabs by mouth once daily for 2 days, then 3 tabs daily x 2 days, then 2 tabs daily x 2 days, then 1 tab daily x 2 days    Dispense:  20 tablet    Refill:  0    Order Specific Question:   Supervising Provider    Answer:   Danise Edge A [[id number]]   azithromycin (ZITHROMAX) 250 MG tablet    Sig: Take 2 tablets on day 1, then 1 tablet daily on days 2 through 5    Dispense:  6 tablet    Refill:  0    Order Specific Question:   Supervising Provider    Answer:   Danise Edge A [[id number]]   benzonatate (TESSALON) 100 MG capsule    Sig: Take 1 capsule (100 mg total) by mouth 3 (three) times daily as needed.    Dispense:  20 capsule    Refill:  0    Order Specific Question:   Supervising Provider    Answer:   Danise Edge A [[id number]]   albuterol (VENTOLIN HFA) 108 (90 Base) MCG/ACT inhaler    Sig: Inhale 2 puffs into the lungs every 6 (six) hours as needed for wheezing or shortness of breath.    Order Specific Question:   Supervising Provider    Answer:   Bradd Canary [[id number]]   I discussed the assessment and treatment plan with the patient. The patient was provided an opportunity to ask questions and all were answered. The patient agreed with the plan and demonstrated an understanding of the instructions.   The patient was advised to call back or seek an in-person evaluation if the symptoms worsen or if the condition fails to improve as anticipated.    Lemont Fillers, NP [facility] at [facility] [phone] (phone) [phone] (fax)  [facility]

## 2023-01-17 ENCOUNTER — Ambulatory Visit (HOSPITAL_BASED_OUTPATIENT_CLINIC_OR_DEPARTMENT_OTHER)
Admission: RE | Admit: 2023-01-17 | Discharge: 2023-01-17 | Disposition: A | Discharge status: Home / Self Care | Payer: Medicare PPO | Source: Ambulatory Visit | Attending: Family | Admitting: Family

## 2023-01-17 DIAGNOSIS — J40 Bronchitis, not specified as acute or chronic: Secondary | ICD-10-CM | POA: Diagnosis not present

## 2023-01-17 DIAGNOSIS — J209 Acute bronchitis, unspecified: Secondary | ICD-10-CM | POA: Insufficient documentation

## 2023-01-17 DIAGNOSIS — R059 Cough, unspecified: Secondary | ICD-10-CM | POA: Diagnosis not present

## 2023-01-23 NOTE — Progress Notes (Deleted)
MyChart Video Visit    Virtual Visit via Video Note    Patient location: Home. Patient and provider in visit Provider location: Home  I discussed the limitations of evaluation and management by telemedicine and the availability of in person appointments. The patient expressed understanding and agreed to proceed.  Visit Date: 01/16/2023  Today's healthcare provider: Lemont Fillers, NP     Subjective:    Patient ID: Brandy Dunlap, female    DOB: [birth year], 72 y.o.   MRN: [id number]  Patient location: Home. Patient and provider in visit Provider location: Home  I discussed the limitations of evaluation and management by telemedicine and the availability of in person appointments. The patient expressed understanding and agreed to proceed.  Visit Date: 01/16/2023  Today's healthcare provider: Lemont Fillers, NP  Chief Complaint  Patient presents with   Cough    Patient reports persistent cough for about 2 weeks. OTC meds not working.     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   Essential hypertension 10/08/2007   Qualifier: Diagnosis of  By: Lovell Sheehan MD, Balinda Quails    GERD (gastroesophageal reflux disease)    History of kidney stones    per 09-02-2018 pre-op eval , suspected kidney stone , to [facility] CT abd on 7-9- for further eval , passive   Hypertension    Infectious disease 05/23/2015   Low back pain    Osteoarthritis     Pneumonia    Pre-diabetes    Prediabetes 07/09/2016   Referred otalgia of left ear 03/26/2016   Sinus trouble     Past Surgical History:  Procedure Laterality Date   CHOLECYSTECTOMY     COLONOSCOPY     COLONOSCOPY W/ BIOPSIES  10/06/2003   left knee replacement Left 05/2021   LUMBAR FUSION  2008   LUMBAR FUSION     2020   PARTIAL HIP ARTHROPLASTY Right 2009    hip replacement   TONSILLECTOMY     TOTAL HIP ARTHROPLASTY Left 09/10/2018   Procedure: TOTAL HIP ARTHROPLASTY ANTERIOR APPROACH;  Surgeon: Ollen Gross, MD;  Location: [facility];  Service: Orthopedics;  Laterality: Left;   TOTAL KNEE ARTHROPLASTY Right 11/16/2013   Procedure: RIGHT TOTAL KNEE ARTHROPLASTY;  Surgeon: Dannielle Huh, MD;  Location: [facility];  Service: Orthopedics;  Laterality: Right;   VIDEO BRONCHOSCOPY Bilateral 12/05/2012   Procedure: VIDEO BRONCHOSCOPY WITHOUT FLUORO;  Surgeon: Leslye Peer, MD;  [facility]: [facility];  Service: Cardiopulmonary;  Laterality: Bilateral;    Family History  Problem Relation Age of Onset   COPD Mother    Heart disease Mother    Ovarian cancer Mother    Emphysema Mother    Hypertension Mother    Hyperlipidemia Mother    Heart Problems Mother    Stroke Mother    Thyroid disease Mother  Cancer Mother    COPD Father        died at age 35   Emphysema Father    Heart Problems Father    Heart disease Brother        stent with 90%   Breast cancer Maternal Aunt    Colon cancer Neg Hx    Esophageal cancer Neg Hx    Rectal cancer Neg Hx    Stomach cancer Neg Hx    Colon polyps Neg Hx     Social History   Socioeconomic History   Marital status: Single    Spouse name: Not on file   Number of children: 1   Years of education: Not on file   Highest education level: Bachelor's degree (e.g., BA, AB, BS)  Occupational History   Occupation: Magazine features editor: [employer]  Tobacco Use   Smoking status: Never    Passive exposure: Never   Smokeless  tobacco: Never  Vaping Use   Vaping status: Never Used  Substance and Sexual Activity   Alcohol use: No   Drug use: No   Sexual activity: Not Currently    Partners: Male    Birth control/protection: Post-menopausal  Other Topics Concern   Not on file  Social History Narrative   Divorced   One daughter- lives locally and one grandson   Retired Runner, broadcasting/film/video,  Has masters degree   Enjoys reading, spending time with Brandy Dunlap grandson   Social Determinants of Health   Financial Resource Strain: Low Risk  (01/14/2023)   Overall Financial Resource Strain (CARDIA)    Difficulty of Paying Living Expenses: Not hard at all  Food Insecurity: No Food Insecurity (01/14/2023)   Hunger Vital Sign    Worried About Running Out of Food in the Last Year: Never true    Ran Out of Food in the Last Year: Never true  Transportation Needs: No Transportation Needs (01/14/2023)   PRAPARE - Administrator, Civil Service (Medical): No    Lack of Transportation (Non-Medical): No  Physical Activity: Insufficiently Active (01/14/2023)   Exercise Vital Sign    Days of Exercise per Week: 3 days    Minutes of Exercise per Session: 40 min  Stress: No Stress Concern Present (01/14/2023)   [employer] of Occupational Health - Occupational Stress Questionnaire    Feeling of Stress : Not at all  Social Connections: Unknown (01/14/2023)   Social Connection and Isolation Panel [NHANES]    Frequency of Communication with Friends and Family: Twice a week    Frequency of Social Gatherings with Friends and Family: Once a week    Attends Religious Services: Patient declined    Database administrator or Organizations: Yes    Attends Engineer, structural: More than 4 times per year    Marital Status: Divorced  Intimate Partner Violence: Not At Risk (04/12/2022)   Humiliation, Afraid, Rape, and Kick questionnaire    Fear of Current or Ex-Partner: No    Emotionally Abused: No    Physically Abused: No     Sexually Abused: No    Outpatient Medications Prior to Visit  Medication Sig Dispense Refill   AIRSUPRA 90-80 MCG/ACT AERO SMARTSIG:2 Puff(s) Via Inhaler Daily PRN     budesonide-formoterol (SYMBICORT) 160-4.5 MCG/ACT inhaler Inhale 2 puffs into the lungs 2 (two) times daily.     DEXILANT 60 MG capsule TAKE 1 CAPSULE BY MOUTH EVERY DAY 90 capsule 1   EPINEPHrine 0.3  mg/0.3 mL IJ SOAJ injection Inject 0.3 mg into the muscle as needed for anaphylaxis.     estradiol (ESTRACE) 0.1 MG/GM vaginal cream Place a pea-sized amount on fingertip and apply 3 times weekly to vaginal region and urethra as directed 42.5 g 5   meclizine (ANTIVERT) 12.5 MG tablet Take 1 tablet (12.5 mg total) by mouth 3 (three) times daily as needed for dizziness. 30 tablet 0   montelukast (SINGULAIR) 10 MG tablet Take 1 tablet (10 mg total) by mouth at bedtime. 30 tablet 3   omalizumab (XOLAIR) 150 MG injection Inject 150 mg into the skin every 14 (fourteen) days.     sulfamethoxazole-trimethoprim (BACTRIM) 400-80 MG tablet Take 1 tablet by mouth at bedtime. 90 tablet 0   traMADol (ULTRAM) 50 MG tablet Take 50-100 mg by mouth every 6 (six) hours as needed for pain.     amLODipine (NORVASC) 5 MG tablet Take 1 tablet (5 mg total) by mouth daily. 90 tablet 3   hydrochlorothiazide (MICROZIDE) 12.5 MG capsule Take 1 capsule (12.5 mg total) by mouth daily. 90 capsule 3   No facility-administered medications prior to visit.    Allergies  Allergen Reactions   Augmentin [Amoxicillin-Pot Clavulanate] Diarrhea   Pantoprazole Sodium Palpitations    ROS    See HPI Objective:    Physical Exam  There were no vitals taken for this visit. Wt Readings from Last 3 Encounters:  11/21/22 189 lb (85.7 kg)  10/09/22 190 lb (86.2 kg)  09/25/22 194 lb (88 kg)       Assessment & Plan:   Problem List Items Addressed This Visit       Unprioritized   Bronchitis with bronchospasm - Primary     Persistent cough with thick clear  sputum, wheezing, and fatigue for two weeks. No fever. Current medications include Symbicort and Singulair. -Start Prednisone course. -Continue Albuterol every 6 hours routinely. -Continue Symbicort two puffs twice a day. -Order Z-Pak and send to [facility]. -Order chest x-ray to rule out pneumonia. -Consider Tessalon for cough relief.  -Advise to get COVID-19 booster shot at any major pharmacy or at the med center downstairs.  Follow-up If symptoms worsen, develop a fever, or no improvement in 3-4 days, patient to contact office.      Relevant Medications   predniSONE (DELTASONE) 10 MG tablet   Other Relevant Orders   DG Chest 2 View (Completed)    I am having Brandy Dunlap. Knippenberg start on predniSONE, azithromycin, benzonatate, and albuterol. I am also having Brandy Dunlap maintain Brandy Dunlap omalizumab, budesonide-formoterol, traMADol, EPINEPHrine, montelukast, amLODipine, hydrochlorothiazide, Airsupra, meclizine, sulfamethoxazole-trimethoprim, estradiol, and Dexilant.  Meds ordered this encounter  Medications   predniSONE (DELTASONE) 10 MG tablet    Sig: 4 tabs by mouth once daily for 2 days, then 3 tabs daily x 2 days, then 2 tabs daily x 2 days, then 1 tab daily x 2 days    Dispense:  20 tablet    Refill:  0    Order Specific Question:   Supervising Provider    Answer:   Danise Edge A [[id number]]   azithromycin (ZITHROMAX) 250 MG tablet    Sig: Take 2 tablets on day 1, then 1 tablet daily on days 2 through 5    Dispense:  6 tablet    Refill:  0    Order Specific Question:   Supervising Provider    Answer:   Danise Edge A [[id number]]   benzonatate (TESSALON) 100 MG capsule  Sig: Take 1 capsule (100 mg total) by mouth 3 (three) times daily as needed.    Dispense:  20 capsule    Refill:  0    Order Specific Question:   Supervising Provider    Answer:   Danise Edge A [[id number]]   albuterol (VENTOLIN HFA) 108 (90 Base) MCG/ACT inhaler    Sig: Inhale 2 puffs into the lungs every 6 (six) hours  as needed for wheezing or shortness of breath.    Order Specific Question:   Supervising Provider    Answer:   Bradd Canary [[id number]]    I discussed the assessment and treatment plan with the patient. The patient was provided an opportunity to ask questions and all were answered. The patient agreed with the plan and demonstrated an understanding of the instructions.   The patient was advised to call back or seek an in-person evaluation if the symptoms worsen or if the condition fails to improve as anticipated.    Lemont Fillers, NP [facility] at [facility] [phone] (phone) [phone] (fax)  [facility]

## 2023-02-01 DIAGNOSIS — J4540 Moderate persistent asthma, uncomplicated: Secondary | ICD-10-CM | POA: Diagnosis not present

## 2023-02-21 DIAGNOSIS — J4540 Moderate persistent asthma, uncomplicated: Secondary | ICD-10-CM | POA: Diagnosis not present

## 2023-03-06 ENCOUNTER — Telehealth: Payer: Self-pay | Admitting: Family

## 2023-03-06 NOTE — Telephone Encounter (Signed)
 Pt dropped off copy of document from insurance company indicating pt is needing to get approved for her meds, pt wanted PCP to see document and contact them to get approved her meds. Document put at front office tray under providers name.

## 2023-03-07 NOTE — Telephone Encounter (Signed)
 Document will be faxed to PA team

## 2023-03-12 DIAGNOSIS — J45998 Other asthma: Secondary | ICD-10-CM | POA: Diagnosis not present

## 2023-03-12 DIAGNOSIS — J3089 Other allergic rhinitis: Secondary | ICD-10-CM | POA: Diagnosis not present

## 2023-03-12 DIAGNOSIS — J4550 Severe persistent asthma, uncomplicated: Secondary | ICD-10-CM | POA: Diagnosis not present

## 2023-03-12 DIAGNOSIS — J301 Allergic rhinitis due to pollen: Secondary | ICD-10-CM | POA: Diagnosis not present

## 2023-03-12 DIAGNOSIS — J3081 Allergic rhinitis due to animal (cat) (dog) hair and dander: Secondary | ICD-10-CM | POA: Diagnosis not present

## 2023-03-22 ENCOUNTER — Telehealth: Payer: Self-pay | Admitting: Family

## 2023-03-22 NOTE — Telephone Encounter (Signed)
Copied from CRM [id number]. Topic: Medicare AWV >> Mar 22, 2023  3:16 PM Payton Doughty wrote: Reason for CRM: Called LVM 03/22/2023 to schedule AWV. Please schedule Virtual or Telehealth visits ONLY.   Verlee Rossetti; Care Guide Ambulatory Clinical Support [place] [facility] Direct Dial: [phone]

## 2023-03-27 ENCOUNTER — Encounter: Payer: Self-pay | Admitting: Hematology and Oncology

## 2023-03-28 ENCOUNTER — Encounter: Payer: Self-pay | Admitting: Urology

## 2023-03-28 ENCOUNTER — Ambulatory Visit: Payer: Medicare PPO | Admitting: Urology

## 2023-03-28 VITALS — BP 144/73 | HR 76

## 2023-03-28 DIAGNOSIS — Z87440 Personal history of urinary (tract) infections: Secondary | ICD-10-CM | POA: Diagnosis not present

## 2023-03-28 DIAGNOSIS — N393 Stress incontinence (female) (male): Secondary | ICD-10-CM | POA: Diagnosis not present

## 2023-03-28 DIAGNOSIS — N952 Postmenopausal atrophic vaginitis: Secondary | ICD-10-CM | POA: Diagnosis not present

## 2023-03-28 DIAGNOSIS — N390 Urinary tract infection, site not specified: Secondary | ICD-10-CM | POA: Diagnosis not present

## 2023-03-28 NOTE — Progress Notes (Signed)
Assessment: 1. Recurrent UTI   2. SUI (stress urinary incontinence, female)   3. Atrophic vaginitis     Plan: Continue UTI prevention measures Patient will stop LD bactrim suppression Kegel exercises again stressed to patient.  Her SUI has improved significantly Continue vaginal estrogen cream for her recurrent UTIs and atrophic vaginitis Follow-up 66mo or sooner if problems arise  Chief Complaint: Recurrent uti  HPI: Brandy Dunlap is a 73 y.o. female who presents for continued evaluation of recurrent UTI.  Patient also has mild stress urinary incontinence.   Please see my note 10/09/2022 at the time of initial visit for detailed history. At that time she was relatively asymptomatic and her urine culture grew multiple organisms.  She had a follow-up resolve MDX that did grow Enterococcus and E. coli for which she was treated with Levaquin and started on low-dose Bactrim suppression.  She is now on UTI prevention with urinary probiotic as well as vaginal estrogen cream.  She is also taking low-dose suppression for the last few months.  She is doing very well.  She also reports marked improvement in her stress urinary incontinence.  I have discussed Kegel exercises with her in detail.  UA today is entirely clear  Portions of the above documentation were copied from a prior visit for review purposes only.  Allergies: Allergies  Allergen Reactions   Augmentin [Amoxicillin-Pot Clavulanate] Diarrhea   Pantoprazole Sodium Palpitations    PMH: Past Medical History:  Diagnosis Date   Acute pain of left knee 10/02/2019   Allergic rhinitis 06/29/2009   Qualifier: Diagnosis of  By: Rita Ohara     Allergic rhinitis due to animal (cat) (dog) hair and dander 07/07/2020   Allergy    Asthma    Atypical chest pain 04/18/2017   B12 deficiency    Back pain    Body mass index (BMI) 35.0-35.9, adult 05/01/2019   Bronchitis    hx   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    Chronic neck pain 03/01/2015   Colon polyp 10/06/2003   Depressive disorder 10/01/2016   PT DENIES   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   Essential hypertension 10/08/2007   Qualifier: Diagnosis of  By: Lovell Sheehan MD, Balinda Quails    GERD (gastroesophageal reflux disease)    History of kidney stones    per 09-02-2018 pre-op eval , suspected kidney stone , to [facility] CT abd on 7-9- for further eval , passive   Hypertension    Infectious disease 05/23/2015   Low back pain    Osteoarthritis    Pneumonia    Pre-diabetes    Prediabetes 07/09/2016   Referred otalgia of left ear 03/26/2016   Sinus trouble     PSH: Past Surgical History:  Procedure Laterality Date   CHOLECYSTECTOMY     COLONOSCOPY     COLONOSCOPY W/ BIOPSIES  10/06/2003   left knee replacement Left 05/2021   LUMBAR FUSION  2008   LUMBAR FUSION     2020   PARTIAL HIP ARTHROPLASTY Right 2009    hip replacement   TONSILLECTOMY     TOTAL HIP ARTHROPLASTY Left 09/10/2018   Procedure: TOTAL HIP ARTHROPLASTY ANTERIOR APPROACH;  Surgeon: Ollen Gross, MD;  Location: [facility];  Service: Orthopedics;  Laterality: Left;   TOTAL KNEE ARTHROPLASTY Right 11/16/2013   Procedure: RIGHT TOTAL KNEE ARTHROPLASTY;  Surgeon: Dannielle Huh, MD;  Location: [facility];  Service: Orthopedics;  Laterality: Right;   VIDEO BRONCHOSCOPY Bilateral 12/05/2012   Procedure: VIDEO BRONCHOSCOPY WITHOUT  FLUORO;  Surgeon: Leslye Peer, MD;  Location: [facility];  Service: Cardiopulmonary;  Laterality: Bilateral;    SH: Social History   Tobacco Use   Smoking status: Never    Passive exposure: Never   Smokeless tobacco: Never  Vaping Use   Vaping status: Never Used  Substance Use Topics   Alcohol use: No   Drug use: No    ROS: Constitutional:  Negative for fever, chills, weight loss CV: Negative for chest pain, previous MI, hypertension Respiratory:  Negative for shortness of breath, wheezing, sleep apnea, frequent cough GI:  Negative for nausea,  vomiting, bloody stool, GERD  PE: BP (!) 144/73   Pulse 76  GENERAL APPEARANCE:  Well appearing, well developed, well nourished, NAD    Results: UA clear

## 2023-04-01 ENCOUNTER — Encounter: Payer: Self-pay | Admitting: Family

## 2023-04-01 ENCOUNTER — Telehealth: Payer: Self-pay

## 2023-04-01 NOTE — Telephone Encounter (Signed)
Called pharmacy and they were able to run a 30 days refill. Patient was notified, she will bring a letter she received from insurance about medication needing a pa for approval.

## 2023-04-01 NOTE — Telephone Encounter (Signed)
PA initiated via Covermymeds; KEY: [id number]   Available without authorization.

## 2023-04-02 DIAGNOSIS — J8283 Eosinophilic asthma: Secondary | ICD-10-CM | POA: Diagnosis not present

## 2023-04-02 DIAGNOSIS — J3089 Other allergic rhinitis: Secondary | ICD-10-CM | POA: Diagnosis not present

## 2023-04-02 DIAGNOSIS — J4550 Severe persistent asthma, uncomplicated: Secondary | ICD-10-CM | POA: Diagnosis not present

## 2023-04-02 DIAGNOSIS — J3081 Allergic rhinitis due to animal (cat) (dog) hair and dander: Secondary | ICD-10-CM | POA: Diagnosis not present

## 2023-04-02 NOTE — Telephone Encounter (Signed)
Tried re-initiating PA- it is letting me send it today- KEY: [id number].   PA approved 02/27/23 to 02/26/24

## 2023-04-03 ENCOUNTER — Other Ambulatory Visit: Payer: Self-pay

## 2023-04-03 ENCOUNTER — Other Ambulatory Visit: Payer: Self-pay | Admitting: *Deleted

## 2023-04-03 MED ORDER — DEXLANSOPRAZOLE 60 MG PO CPDR: 60.0000 mg | DELAYED_RELEASE_CAPSULE | Freq: Every day | ORAL | 1 refills | Status: DC

## 2023-04-03 MED ORDER — DEXILANT 60 MG PO CPDR: 60.0000 mg | DELAYED_RELEASE_CAPSULE | Freq: Every day | ORAL | 1 refills | Status: DC

## 2023-04-03 NOTE — Telephone Encounter (Signed)
 Spoke with pharmacy and they ran it through for brand name and 90 day and it went through.  They will have to order for patient.  Patient notified that it went through but they have to order.

## 2023-04-05 LAB — URINALYSIS, ROUTINE W REFLEX MICROSCOPIC
Bilirubin, UA: NEGATIVE
Glucose, UA: NEGATIVE
Ketones, UA: NEGATIVE
Leukocytes,UA: NEGATIVE
Nitrite, UA: NEGATIVE
Protein,UA: NEGATIVE
RBC, UA: NEGATIVE
Specific Gravity, UA: 1.02 (ref 1.005–1.030)
Urobilinogen, Ur: 0.2 mg/dL (ref 0.2–1.0)
pH, UA: 6.5 (ref 5.0–7.5)

## 2023-04-09 DIAGNOSIS — M48061 Spinal stenosis, lumbar region without neurogenic claudication: Secondary | ICD-10-CM | POA: Diagnosis not present

## 2023-04-23 ENCOUNTER — Ambulatory Visit: Payer: Medicare PPO | Admitting: Family

## 2023-04-23 ENCOUNTER — Encounter: Payer: Self-pay | Admitting: Family

## 2023-04-23 VITALS — BP 138/64 | HR 80 | Temp 98.7°F | Resp 16 | Ht 63.0 in | Wt 190.0 lb

## 2023-04-23 DIAGNOSIS — R197 Diarrhea, unspecified: Secondary | ICD-10-CM | POA: Diagnosis not present

## 2023-04-23 DIAGNOSIS — J45909 Unspecified asthma, uncomplicated: Secondary | ICD-10-CM

## 2023-04-23 DIAGNOSIS — R1013 Epigastric pain: Secondary | ICD-10-CM | POA: Diagnosis not present

## 2023-04-23 DIAGNOSIS — N390 Urinary tract infection, site not specified: Secondary | ICD-10-CM

## 2023-04-23 LAB — COMPREHENSIVE METABOLIC PANEL
ALT: 65 U/L — ABNORMAL HIGH (ref 0–35)
AST: 65 U/L — ABNORMAL HIGH (ref 0–37)
Albumin: 4.2 g/dL (ref 3.5–5.2)
Alkaline Phosphatase: 61 U/L (ref 39–117)
BUN: 14 mg/dL (ref 6–23)
CO2: 27 meq/L (ref 19–32)
Calcium: 9.2 mg/dL (ref 8.4–10.5)
Chloride: 101 meq/L (ref 96–112)
Creatinine, Ser: 0.82 mg/dL (ref 0.40–1.20)
GFR: 71.44 mL/min (ref >60.00)
Glucose, Bld: 150 mg/dL — ABNORMAL HIGH (ref 70–99)
Potassium: 3.9 meq/L (ref 3.5–5.1)
Sodium: 139 meq/L (ref 135–145)
Total Bilirubin: 0.7 mg/dL (ref 0.2–1.2)
Total Protein: 6.9 g/dL (ref 6.0–8.3)

## 2023-04-23 LAB — CBC WITH DIFFERENTIAL/PLATELET
Basophils Absolute: 0 10*3/uL (ref 0.0–0.1)
Basophils Relative: 0.3 % (ref 0.0–3.0)
Eosinophils Absolute: 0 10*3/uL (ref 0.0–0.7)
Eosinophils Relative: 0 % (ref 0.0–5.0)
HCT: 43.9 % (ref 36.0–46.0)
Hemoglobin: 14.5 g/dL (ref 12.0–15.0)
Lymphocytes Relative: 43 % (ref 12.0–46.0)
Lymphs Abs: 2 10*3/uL (ref 0.7–4.0)
MCHC: 33.1 g/dL (ref 30.0–36.0)
MCV: 90.7 fL (ref 78.0–100.0)
Monocytes Absolute: 0.4 10*3/uL (ref 0.1–1.0)
Monocytes Relative: 8.4 % (ref 3.0–12.0)
Neutro Abs: 2.2 10*3/uL (ref 1.4–7.7)
Neutrophils Relative %: 48.3 % (ref 43.0–77.0)
Platelets: 274 10*3/uL (ref 150.0–400.0)
RBC: 4.84 Mil/uL (ref 3.87–5.11)
RDW: 14 % (ref 11.5–15.5)
WBC: 4.6 10*3/uL (ref 4.0–10.5)

## 2023-04-23 LAB — LIPASE: Lipase: 29 U/L (ref 11.0–59.0)

## 2023-04-23 NOTE — Assessment & Plan Note (Signed)
 Notes that she is now on Fasenra and her asthma is well controlled.

## 2023-04-23 NOTE — Assessment & Plan Note (Addendum)
  Epigastric pain and diarrhea for a couple of weeks, with some relief from belching and flatulence. Differential includes viral etiology, pancreatitis, H.Pylori, gastritis/ulcers. No vomiting or fever. Pain is intermittent and varies in intensity. No recent antibiotic use. Gallbladder has been removed.  -Order labs including a test for pancreatitis.  -Order stool studies to rule out infection.  -If all tests are normal and symptoms persist consider abdominal imaging and a referral to  to GI for possible endoscopy. -Plan to follow up in about three weeks.

## 2023-04-23 NOTE — Progress Notes (Signed)
 Subjective:     Patient ID: Brandy Dunlap, female    DOB: [birth year], 73 y.o.   MRN: [id number]  Chief Complaint  Patient presents with   Abdominal Pain    Patient complains of gastric pain after eating.    Abdominal Pain    Discussed the use of AI scribe software for clinical note transcription with the patient, who gave verbal consent to proceed.  History of Present Illness   Brandy Dunlap is a 73 year old female who presents with abdominal pain and diarrhea.  She has been experiencing severe, intermittent abdominal pain located in the middle of the abdomen for the past few weeks. The pain is not present every day but can be so intense on some days that she limits her food intake to avoid exacerbating it. The pain is relieved by passing gas or belching. No weight loss, vomiting, or fever has been noted. The symptoms began a couple of weeks after starting [facility] Challenger for asthma management on February 4th, but she does not believe this is related as she did not experience side effects initially.  She has a history of gallbladder removal and is currently taking Dexilant for acid reflux, with no recent changes in this medication. She also uses estrogen cream once a month and is no longer on a low-dose antibiotic for urinary tract infection prevention.  In her social history, she mentions that everyone in her household has been sick with flu-like symptoms, but she has been avoiding them to prevent illness. Additionally, her grandson has been diagnosed with a Chiari malformation and is currently under specialist care.  No fever or vomiting. She experiences diarrhea that is not consistent every day, sometimes presenting as frequent bowel movements. No constipation.          Health Maintenance Due  Topic Date Due   Hepatitis C Screening  Never done   Zoster Vaccines- Shingrix (1 of 2) 11/01/2000   DTaP/Tdap/Td (3 - Td or Tdap) 10/31/2020   COVID-19 Vaccine (4 - 2024-25 season)  10/28/2022   Medicare Annual Wellness (AWV)  04/13/2023    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   Essential hypertension 10/08/2007   Qualifier: Diagnosis of  By: Lovell Sheehan MD, Balinda Quails    GERD (gastroesophageal reflux disease)    History of kidney stones    per 09-02-2018 pre-op eval , suspected kidney stone , to [facility] abd on 7-9- for further eval , passive   Hypertension    Infectious disease 05/23/2015   Low back pain    Osteoarthritis    Pneumonia    Pre-diabetes    Prediabetes 07/09/2016   Referred otalgia of left ear 03/26/2016   Sinus trouble     Past Surgical History:  Procedure Laterality Date   CHOLECYSTECTOMY     COLONOSCOPY     COLONOSCOPY W/ BIOPSIES  10/06/2003   left knee replacement Left 05/2021   LUMBAR FUSION  2008   LUMBAR FUSION     2020   PARTIAL HIP ARTHROPLASTY Right 2009    hip replacement   TONSILLECTOMY     TOTAL HIP ARTHROPLASTY Left 09/10/2018   Procedure: TOTAL HIP ARTHROPLASTY ANTERIOR APPROACH;  Surgeon: Ollen Gross, MD;  Location: [facility];  Service: Orthopedics;  Laterality: Left;   TOTAL KNEE ARTHROPLASTY Right 11/16/2013   Procedure: RIGHT TOTAL KNEE ARTHROPLASTY;  Surgeon: Dannielle Huh, MD;  Location: [facility];  Service: Orthopedics;  Laterality: Right;   VIDEO BRONCHOSCOPY Bilateral 12/05/2012   Procedure: VIDEO BRONCHOSCOPY WITHOUT FLUORO;  Surgeon: Leslye Peer, MD;  Location: [facility];  Service:  Cardiopulmonary;  Laterality: Bilateral;    Family History  Problem Relation Age of Onset   COPD Mother    Heart disease Mother    Ovarian cancer Mother    Emphysema Mother    Hypertension Mother    Hyperlipidemia Mother    Heart Problems Mother    Stroke Mother    Thyroid disease Mother    Cancer Mother    COPD Father        died at age 41   Emphysema Father    Heart Problems Father    Heart disease Brother        stent with 90%   Breast cancer Maternal Aunt    Colon cancer Neg Hx    Esophageal cancer Neg Hx    Rectal cancer Neg Hx    Stomach cancer Neg Hx    Colon polyps Neg Hx     Social History   Socioeconomic History   Marital status: Single    Spouse name: Not on file   Number of children: 1   Years of education: Not on file   Highest education level: Bachelor's degree (e.g., BA, AB, BS)  Occupational History   Occupation: Magazine features editor:  [employer]  Tobacco Use   Smoking status: Never    Passive exposure: Never   Smokeless tobacco: Never  Vaping Use   Vaping status: Never Used  Substance and Sexual Activity   Alcohol use: No   Drug use: No   Sexual activity: Not Currently    Partners: Male    Birth control/protection: Post-menopausal  Other Topics Concern   Not on file  Social History Narrative   Divorced   One daughter- lives locally and one grandson   Retired Runner, broadcasting/film/video,  Has masters degree   Enjoys reading, spending time with her grandson   Social Drivers of Corporate investment banker Strain: Low Risk  (04/19/2023)   Overall Financial Resource Strain (CARDIA)    Difficulty of Paying Living Expenses: Not hard at all  Food Insecurity: No Food Insecurity (04/19/2023)   Hunger Vital Sign    Worried About Running Out of Food in the Last Year: Never true    Ran Out of Food in the Last Year: Never true  Transportation Needs: No Transportation Needs (04/19/2023)   PRAPARE - Administrator, Civil Service (Medical): No     Lack of Transportation (Non-Medical): No  Physical Activity: Sufficiently Active (04/19/2023)   Exercise Vital Sign    Days of Exercise per Week: 4 days    Minutes of Exercise per Session: 40 min  Stress: No Stress Concern Present (04/19/2023)   [employer] of Occupational Health - Occupational Stress Questionnaire    Feeling of Stress :  Not at all  Social Connections: Unknown (04/19/2023)   Social Connection and Isolation Panel [NHANES]    Frequency of Communication with Friends and Family: More than three times a week    Frequency of Social Gatherings with Friends and Family: Twice a week    Attends Religious Services: Patient declined    Database administrator or Organizations: Yes    Attends Engineer, structural: More than 4 times per year    Marital Status: Divorced  Intimate Partner Violence: Not At Risk (04/12/2022)   Humiliation, Afraid, Rape, and Kick questionnaire    Fear of Current or Ex-Partner: No    Emotionally Abused: No    Physically Abused: No    Sexually Abused: No    Outpatient Medications Prior to Visit  Medication Sig Dispense Refill   benzonatate (TESSALON) 100 MG capsule Take 1 capsule (100 mg total) by mouth 3 (three) times daily as needed. 20 capsule 0   budesonide-formoterol (SYMBICORT) 160-4.5 MCG/ACT inhaler Inhale 2 puffs into the lungs 2 (two) times daily.     DEXILANT 60 MG capsule Take 1 capsule (60 mg total) by mouth daily. 90 capsule 1   EPINEPHrine 0.3 mg/0.3 mL IJ SOAJ injection Inject 0.3 mg into the muscle as needed for anaphylaxis.     FASENRA PEN 30 MG/ML prefilled autoinjector 1 injection every 4 weeks x3 doses, then q8 weeks thereafter Subcutaneous every 4 weeks x3 doses, then q8 weeks thereafter for 30 days     meclizine (ANTIVERT) 12.5 MG tablet Take 1 tablet (12.5 mg total) by mouth 3 (three) times daily as needed for dizziness. 30 tablet 0   montelukast (SINGULAIR) 10 MG tablet Take 1 tablet (10 mg total) by mouth at bedtime.  30 tablet 3   traMADol (ULTRAM) 50 MG tablet Take 50-100 mg by mouth every 6 (six) hours as needed for pain.     amLODipine (NORVASC) 5 MG tablet Take 1 tablet (5 mg total) by mouth daily. 90 tablet 3   hydrochlorothiazide (MICROZIDE) 12.5 MG capsule Take 1 capsule (12.5 mg total) by mouth daily. 90 capsule 3   AIRSUPRA 90-80 MCG/ACT AERO SMARTSIG:2 Puff(s) Via Inhaler Daily PRN     albuterol (VENTOLIN HFA) 108 (90 Base) MCG/ACT inhaler Inhale 2 puffs into the lungs every 6 (six) hours as needed for wheezing or shortness of breath. (Patient not taking: Reported on 03/28/2023)     estradiol (ESTRACE) 0.1 MG/GM vaginal cream Place a pea-sized amount on fingertip and apply 3 times weekly to vaginal region and urethra as directed 42.5 g 5   sulfamethoxazole-trimethoprim (BACTRIM) 400-80 MG tablet Take 1 tablet by mouth at bedtime. 90 tablet 0   No facility-administered medications prior to visit.    Allergies  Allergen Reactions   Augmentin [Amoxicillin-Pot Clavulanate] Diarrhea   Pantoprazole Sodium Palpitations    Review of Systems  Gastrointestinal:  Positive for abdominal pain.       Objective:    Physical Exam Constitutional:      General: She is not in acute distress.    Appearance: Normal appearance. She is well-developed.  HENT:     Head: Normocephalic and atraumatic.     Right Ear: External ear normal.     Left Ear: External ear normal.  Eyes:     General: No scleral icterus. Neck:     Thyroid: No thyromegaly.  Cardiovascular:     Rate and Rhythm: Normal rate and regular rhythm.     Heart sounds: Normal heart  sounds. No murmur heard. Pulmonary:     Effort: Pulmonary effort is normal. No respiratory distress.     Breath sounds: Normal breath sounds. No wheezing.  Abdominal:     General: Bowel sounds are normal.     Palpations: Abdomen is soft.     Tenderness: There is abdominal tenderness in the epigastric area. There is no guarding.     Comments: Rectus diastasis   Musculoskeletal:     Cervical back: Neck supple.  Skin:    General: Skin is warm and dry.  Neurological:     Mental Status: She is alert and oriented to person, place, and time.  Psychiatric:        Mood and Affect: Mood normal.        Behavior: Behavior normal.        Thought Content: Thought content normal.        Judgment: Judgment normal.      BP 138/64 (BP Location: Right Arm, Patient Position: Sitting, Cuff Size: Normal)   Pulse 80   Temp 98.7 F (37.1 C) (Oral)   Resp 16   Ht 5\' 3"  (1.6 m)   Wt 190 lb (86.2 kg)   SpO2 96%   BMI 33.66 kg/m  Wt Readings from Last 3 Encounters:  04/23/23 190 lb (86.2 kg)  11/21/22 189 lb (85.7 kg)  10/09/22 190 lb (86.2 kg)       Assessment & Plan:   Problem List Items Addressed This Visit       Unprioritized   Recurrent UTI    No recent UTIs since being treated by Dr. Margo Aye. Currently off low dose antibiotic and using estrogen cream once a month. -Continue current regimen as it seems to be effective. -Will continue to prescribe estrogen cream as needed.       Epigastric pain    Epigastric pain and diarrhea for a couple of weeks, with some relief from belching and flatulence. Differential includes viral etiology, pancreatitis, H.Pylori, gastritis/ulcers. No vomiting or fever. Pain is intermittent and varies in intensity. No recent antibiotic use. Gallbladder has been removed.  -Order labs including a test for pancreatitis.  -Order stool studies to rule out infection.  -If all tests are normal and symptoms persist consider abdominal imaging and a referral to  to GI for possible endoscopy. -Plan to follow up in about three weeks.       Relevant Orders   Lipase   Asthma   Notes that she is now on Fasenra and her asthma is well controlled.       Relevant Medications   FASENRA PEN 30 MG/ML prefilled autoinjector   Other Visit Diagnoses       Diarrhea, unspecified type    -  Primary   Relevant Orders   CBC w/Diff    Comp Met (CMET)   Stool Culture   Clostridium Difficile by PCR([facility])       I have discontinued Larry Sierras. Hermans's Airsupra, sulfamethoxazole-trimethoprim, estradiol, and albuterol. I am also having her maintain her budesonide-formoterol, traMADol, EPINEPHrine, montelukast, amLODipine, hydrochlorothiazide, meclizine, benzonatate, Dexilant, and Fasenra Pen.  No orders of the defined types were placed in this encounter.

## 2023-04-23 NOTE — Assessment & Plan Note (Signed)
>>  ASSESSMENT AND PLAN FOR ASTHMA WRITTEN ON 04/23/2023 11:04 AM BY O'SULLIVAN, Ashari Llewellyn, NP  Notes that she is now on Fasenra  and her asthma is well controlled.

## 2023-04-23 NOTE — Patient Instructions (Signed)
 VISIT SUMMARY:  Today, you were seen for abdominal pain and diarrhea that you have been experiencing intermittently for the past few weeks. We discussed your symptoms, including the relief you get from passing gas or belching, and the absence of fever, vomiting, or weight loss. We also reviewed your history of gallbladder removal and current medications. Additionally, we touched on your asthma management and recurrent UTIs.  YOUR PLAN:  -ABDOMINAL PAIN AND DIARRHEA: Abdominal pain and diarrhea can be caused by various factors, including infections, digestive issues, or other underlying conditions. We will conduct lab tests, including a test for pancreatitis and stool studies to rule out infection. If these tests are normal, we may refer you to a gastroenterologist for further evaluation, including a possible endoscopy to check for ulcers and H. pylori. We will follow up in about three weeks.  -RECURRENT UTIS: Recurrent urinary tract infections (UTIs) are repeated infections of the urinary tract. You have not had any recent UTIs since being treated by Dr. Margo Aye and are currently using estrogen cream once a month. We will continue this regimen as it seems to be effective and will prescribe the estrogen cream as needed.  -ASTHMA: Asthma is a condition where your airways narrow and swell, producing extra mucus, which can make breathing difficult. You recently switched from Xolair to [facility] for your asthma management. There are no noted side effects from [place], and it does not appear to be linked to your current gastrointestinal symptoms. Continue using Fasenra as scheduled.  INSTRUCTIONS:  We will follow up in about three weeks to review your lab results and discuss the next steps. If you experience any new or worsening symptoms, please contact our office immediately.

## 2023-04-23 NOTE — Assessment & Plan Note (Signed)
  No recent UTIs since being treated by Dr. Margo Aye. Currently off low dose antibiotic and using estrogen cream once a month. -Continue current regimen as it seems to be effective. -Will continue to prescribe estrogen cream as needed.

## 2023-04-24 ENCOUNTER — Other Ambulatory Visit: Payer: Medicare PPO

## 2023-04-24 ENCOUNTER — Telehealth: Payer: Self-pay | Admitting: Family

## 2023-04-24 DIAGNOSIS — R197 Diarrhea, unspecified: Secondary | ICD-10-CM

## 2023-04-24 DIAGNOSIS — R1013 Epigastric pain: Secondary | ICD-10-CM

## 2023-04-24 NOTE — Addendum Note (Signed)
 Addended by: Mervin Kung A on: 04/24/2023 10:23 AM   Modules accepted: Orders

## 2023-04-24 NOTE — Telephone Encounter (Signed)
 See mychart.

## 2023-04-24 NOTE — Progress Notes (Signed)
 Specimen drop off

## 2023-04-25 ENCOUNTER — Encounter: Payer: Self-pay | Admitting: Family

## 2023-04-25 LAB — CLOSTRIDIUM DIFFICILE BY PCR: Toxigenic C. Difficile by PCR: NEGATIVE

## 2023-04-26 LAB — SALMONELLA/SHIGELLA CULT, CAMPY EIA AND SHIGA TOXIN RFL ECOLI
MICRO NUMBER: 16131946
MICRO NUMBER:: 16131947
MICRO NUMBER:: 16131948
Result:: NOT DETECTED
SHIGA RESULT:: NOT DETECTED
SPECIMEN QUALITY: ADEQUATE
SPECIMEN QUALITY:: ADEQUATE
SPECIMEN QUALITY:: ADEQUATE

## 2023-04-30 ENCOUNTER — Other Ambulatory Visit (HOSPITAL_BASED_OUTPATIENT_CLINIC_OR_DEPARTMENT_OTHER): Payer: Medicare PPO

## 2023-05-02 DIAGNOSIS — J4540 Moderate persistent asthma, uncomplicated: Secondary | ICD-10-CM | POA: Diagnosis not present

## 2023-05-03 ENCOUNTER — Ambulatory Visit (HOSPITAL_BASED_OUTPATIENT_CLINIC_OR_DEPARTMENT_OTHER)
Admission: RE | Admit: 2023-05-03 | Discharge: 2023-05-03 | Disposition: A | Discharge status: Home / Self Care | Payer: Medicare PPO | Source: Ambulatory Visit | Attending: Family | Admitting: Family

## 2023-05-03 DIAGNOSIS — R1013 Epigastric pain: Secondary | ICD-10-CM | POA: Insufficient documentation

## 2023-05-03 DIAGNOSIS — Z9049 Acquired absence of other specified parts of digestive tract: Secondary | ICD-10-CM | POA: Diagnosis not present

## 2023-05-12 ENCOUNTER — Encounter: Payer: Self-pay | Admitting: Family

## 2023-05-21 ENCOUNTER — Ambulatory Visit: Payer: Medicare PPO | Admitting: Family

## 2023-05-23 ENCOUNTER — Encounter: Payer: Self-pay | Admitting: Gastroenterology

## 2023-05-30 DIAGNOSIS — J4540 Moderate persistent asthma, uncomplicated: Secondary | ICD-10-CM | POA: Diagnosis not present

## 2023-06-11 ENCOUNTER — Encounter: Payer: Self-pay | Admitting: Hematology and Oncology

## 2023-06-11 ENCOUNTER — Inpatient Hospital Stay: Payer: Medicare PPO | Admitting: Hematology and Oncology

## 2023-06-11 ENCOUNTER — Inpatient Hospital Stay: Payer: Medicare PPO | Attending: Hematology and Oncology

## 2023-06-11 VITALS — BP 147/61 | HR 84 | Temp 98.0°F | Resp 16 | Wt 189.5 lb

## 2023-06-11 DIAGNOSIS — Z79899 Other long term (current) drug therapy: Secondary | ICD-10-CM | POA: Insufficient documentation

## 2023-06-11 DIAGNOSIS — J45909 Unspecified asthma, uncomplicated: Secondary | ICD-10-CM | POA: Insufficient documentation

## 2023-06-11 DIAGNOSIS — D509 Iron deficiency anemia, unspecified: Secondary | ICD-10-CM | POA: Insufficient documentation

## 2023-06-11 DIAGNOSIS — R194 Change in bowel habit: Secondary | ICD-10-CM | POA: Diagnosis not present

## 2023-06-11 LAB — CBC WITH DIFFERENTIAL/PLATELET
Abs Immature Granulocytes: 0.01 10*3/uL (ref 0.00–0.07)
Basophils Absolute: 0 10*3/uL (ref 0.0–0.1)
Basophils Relative: 0 %
Eosinophils Absolute: 0 10*3/uL (ref 0.0–0.5)
Eosinophils Relative: 0 %
HCT: 42.3 % (ref 36.0–46.0)
Hemoglobin: 14.4 g/dL (ref 12.0–15.0)
Immature Granulocytes: 0 %
Lymphocytes Relative: 44 %
Lymphs Abs: 2.4 10*3/uL (ref 0.7–4.0)
MCH: 29.4 pg (ref 26.0–34.0)
MCHC: 34 g/dL (ref 30.0–36.0)
MCV: 86.5 fL (ref 80.0–100.0)
Monocytes Absolute: 0.4 10*3/uL (ref 0.1–1.0)
Monocytes Relative: 7 %
Neutro Abs: 2.6 10*3/uL (ref 1.7–7.7)
Neutrophils Relative %: 49 %
Platelets: 265 10*3/uL (ref 150–400)
RBC: 4.89 MIL/uL (ref 3.87–5.11)
RDW: 14.1 % (ref 11.5–15.5)
WBC: 5.5 10*3/uL (ref 4.0–10.5)
nRBC: 0 % (ref 0.0–0.2)

## 2023-06-11 NOTE — Progress Notes (Signed)
 [facility] CONSULT NOTE  Patient Care Team: Sandford Craze, NP as PCP - General (Internal Medicine)  CHIEF COMPLAINTS/PURPOSE OF CONSULTATION:  IDA  ASSESSMENT & PLAN:   This is a very pleasant 73 year old female patient with hypertension, iron deficiency who is here for follow-up regarding IDA. Assessment & Plan Iron Deficiency Anemia Hemoglobin normal at 14.4. No recent iron supplementation or gastrointestinal bleeding. Likely related to hiatal hernia affecting absorption. I offered her to go back to the lab and she wanted to defer it today since she feels so well. - I asked her if she can do iron panel and ferritin at her next visit with PCP and share results. - Schedule follow-up in one year unless symptoms return.  Gastrointestinal Issues Frequent bowel movements, up to five times in the morning and postprandially. Abdominal pain improved, frequency persists. Scheduled for gastroenterologist evaluation. - Follow up with gastroenterologist on May 29 for further evaluation.  Asthma On new medication, no further details on medication or symptoms. - Continue current asthma management and medication as prescribed.   HISTORY OF PRESENTING ILLNESS:  Brandy Dunlap 73 y.o. female is here because of IDA.  This is a very pleasant 73 year old female patient with past medical history significant for hip replacement, iron deficiency anemia referred to hematology for evaluation of iron deficiency anemia.   Discussed the use of AI scribe software for clinical note transcription with the patient, who gave verbal consent to proceed.  History of Present Illness A 73 year old female with iron deficiency anemia presents for follow-up.  She feels great and denies symptoms of fatigue or pica, which she experienced previously. She is not currently taking any oral iron supplements, and her last iron infusion was in 2022. Her hemoglobin level is 14.4, and her last ferritin  level in April 2024 was 71.   She is on a new medication for asthma but did not specify the name or dosage. She has an upcoming appointment with a gastroenterologist on May 29th due to ongoing gastrointestinal issues. She experiences frequent bowel movements, sometimes up to five times in the morning without eating, and again 30 minutes after eating. She has a known hiatal hernia, which she acknowledges but states it has not been bothersome. Previous evaluations have included checks of her pancreas, liver, and aorta, all of which were normal. She underwent endoscopy and colonoscopy in 2022, which did not reveal significant findings.  No bleeding, black stools, or other concerning symptoms. She is up to date with her colonoscopy and mammograms. She engages in chair yoga and low-impact aerobics twice a week each, and she enjoys walking, especially in rainy weather.  She retired in 2015 and has not worked for the past two years, which she finds enjoyable.    Rest of the pertinent 10 point ROS reviewed and negative.   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   Essential hypertension 10/08/2007   Qualifier: Diagnosis of  By: Lovell Sheehan MD, Balinda Quails    GERD (gastroesophageal reflux disease)    History of kidney stones    per 09-02-2018 pre-op eval , suspected kidney stone , to [facility] CT abd on 7-9- for further eval , passive   Hypertension    Infectious  disease 05/23/2015   Low back pain    Osteoarthritis    Pneumonia    Pre-diabetes    Prediabetes 07/09/2016   Referred otalgia of left ear 03/26/2016   Sinus trouble     SURGICAL HISTORY: Past Surgical History:  Procedure Laterality Date   CHOLECYSTECTOMY     COLONOSCOPY     COLONOSCOPY W/ BIOPSIES  10/06/2003   left knee replacement Left 05/2021   LUMBAR FUSION  2008   LUMBAR FUSION     2020   PARTIAL HIP ARTHROPLASTY Right 2009    hip replacement   TONSILLECTOMY     TOTAL HIP ARTHROPLASTY Left 09/10/2018   Procedure: TOTAL HIP ARTHROPLASTY ANTERIOR APPROACH;  Surgeon: Ollen Gross, MD;  Location: [facility];  Service: Orthopedics;  Laterality: Left;   TOTAL KNEE ARTHROPLASTY Right 11/16/2013   Procedure: RIGHT TOTAL KNEE ARTHROPLASTY;  Surgeon: Dannielle Huh, MD;  Location: [facility] OR;  Service: Orthopedics;  Laterality: Right;   VIDEO BRONCHOSCOPY Bilateral 12/05/2012   Procedure: VIDEO BRONCHOSCOPY WITHOUT FLUORO;  Surgeon: Leslye Peer, MD;  Location: [facility];  Service: Cardiopulmonary;  Laterality: Bilateral;    SOCIAL HISTORY: Social History   Socioeconomic History   Marital status: Single    Spouse name: Not on file   Number of children: 1   Years of education: Not on file   Highest education level: Bachelor's degree (e.g., BA, AB, BS)  Occupational History   Occupation: Magazine features editor: [employer]  Tobacco Use   Smoking status: Never    Passive exposure: Never   Smokeless tobacco: Never  Vaping Use   Vaping status: Never Used  Substance and Sexual Activity   Alcohol use: No   Drug use: No   Sexual activity: Not Currently    Partners: Male    Birth control/protection: Post-menopausal  Other Topics Concern   Not on file  Social History Narrative   Divorced   One daughter- lives locally and one grandson   Retired Runner, broadcasting/film/video,  Has masters degree   Enjoys reading, spending time with her grandson   Social Drivers of Research scientist (physical sciences) Strain: Low Risk  (04/19/2023)   Overall Financial Resource Strain (CARDIA)    Difficulty of Paying Living Expenses: Not hard at all  Food Insecurity: No Food Insecurity (04/19/2023)   Hunger Vital Sign    Worried About Running Out of Food in the Last Year: Never true    Ran Out of Food in the Last Year: Never true  Transportation Needs: No Transportation Needs (04/19/2023)   PRAPARE - Administrator, Civil Service (Medical): No    Lack of Transportation (Non-Medical): No  Physical Activity: Sufficiently Active (04/19/2023)   Exercise Vital Sign    Days of Exercise per Week: 4 days    Minutes of Exercise per Session: 40 min  Stress: No Stress Concern Present (04/19/2023)   [employer] of Occupational Health - Occupational Stress Questionnaire    Feeling of Stress : Not at all  Social Connections: Unknown (04/19/2023)   Social Connection and Isolation Panel [NHANES]  Frequency of Communication with Friends and Family: More than three times a week    Frequency of Social Gatherings with Friends and Family: Twice a week    Attends Religious Services: Patient declined    Database administrator or Organizations: Yes    Attends Engineer, structural: More than 4 times per year    Marital Status: Divorced  Intimate Partner Violence: Not At Risk (04/12/2022)   Humiliation, Afraid, Rape, and Kick questionnaire    Fear of Current or Ex-Partner: No    Emotionally Abused: No    Physically Abused: No    Sexually Abused: No    FAMILY HISTORY: Family History  Problem Relation Age of Onset   COPD Mother    Heart disease Mother    Ovarian cancer Mother    Emphysema Mother    Hypertension Mother    Hyperlipidemia Mother    Heart Problems Mother    Stroke Mother    Thyroid disease Mother    Cancer Mother    COPD Father        died at age 64   Emphysema Father    Heart Problems Father    Heart disease Brother        stent with 90%   Breast cancer Maternal  Aunt    Colon cancer Neg Hx    Esophageal cancer Neg Hx    Rectal cancer Neg Hx    Stomach cancer Neg Hx    Colon polyps Neg Hx     ALLERGIES:  is allergic to augmentin [amoxicillin-pot clavulanate] and pantoprazole sodium.  MEDICATIONS:  Current Outpatient Medications  Medication Sig Dispense Refill   amLODipine (NORVASC) 5 MG tablet Take 1 tablet (5 mg total) by mouth daily. 90 tablet 3   benzonatate (TESSALON) 100 MG capsule Take 1 capsule (100 mg total) by mouth 3 (three) times daily as needed. 20 capsule 0   budesonide-formoterol (SYMBICORT) 160-4.5 MCG/ACT inhaler Inhale 2 puffs into the lungs 2 (two) times daily.     DEXILANT 60 MG capsule Take 1 capsule (60 mg total) by mouth daily. 90 capsule 1   EPINEPHrine 0.3 mg/0.3 mL IJ SOAJ injection Inject 0.3 mg into the muscle as needed for anaphylaxis.     FASENRA PEN 30 MG/ML prefilled autoinjector 1 injection every 4 weeks x3 doses, then q8 weeks thereafter Subcutaneous every 4 weeks x3 doses, then q8 weeks thereafter for 30 days     hydrochlorothiazide (MICROZIDE) 12.5 MG capsule Take 1 capsule (12.5 mg total) by mouth daily. 90 capsule 3   meclizine (ANTIVERT) 12.5 MG tablet Take 1 tablet (12.5 mg total) by mouth 3 (three) times daily as needed for dizziness. 30 tablet 0   montelukast (SINGULAIR) 10 MG tablet Take 1 tablet (10 mg total) by mouth at bedtime. 30 tablet 3   traMADol (ULTRAM) 50 MG tablet Take 50-100 mg by mouth every 6 (six) hours as needed for pain.     No current facility-administered medications for this visit.    PHYSICAL EXAMINATION:  ECOG PERFORMANCE STATUS: 0 - Asymptomatic  Vitals:   06/11/23 1131  BP: (!) 147/61  Pulse: 84  Resp: 16  Temp: 98 F (36.7 C)  SpO2: 96%   Filed Weights   06/11/23 1131  Weight: 189 lb 8 oz (86 kg)    GENERAL:alert, no distress and comfortable SKIN: skin color, texture, turgor are normal, no rashes or significant lesions EYES: normal, conjunctiva are pink and  non-injected, sclera clear OROPHARYNX:no exudate,  no erythema and lips, buccal mucosa, and tongue normal  NECK: supple, thyroid normal size, non-tender, without nodularity LYMPH:  no palpable lymphadenopathy in the cervical, axillary  LUNGS: clear to auscultation and percussion with normal breathing effort HEART: regular rate & rhythm and no murmurs and no lower extremity edema ABDOMEN:abdomen soft, non-tender and normal bowel sounds Musculoskeletal:no cyanosis of digits and no clubbing  PSYCH: alert & oriented x 3 with fluent speech NEURO: no focal motor/sensory deficits  LABORATORY DATA:  I have reviewed the data as listed Lab Results  Component Value Date   WBC 5.5 06/11/2023   HGB 14.4 06/11/2023   HCT 42.3 06/11/2023   MCV 86.5 06/11/2023   PLT 265 06/11/2023     Chemistry      Component Value Date/Time   NA 139 04/23/2023 1101   NA 140 07/24/2022 1000   K 3.9 04/23/2023 1101   CL 101 04/23/2023 1101   CO2 27 04/23/2023 1101   BUN 14 04/23/2023 1101   BUN 9 07/24/2022 1000   CREATININE 0.82 04/23/2023 1101   CREATININE 0.71 06/08/2021 1338   CREATININE 0.73 04/24/2019 1557      Component Value Date/Time   CALCIUM 9.2 04/23/2023 1101   ALKPHOS 61 04/23/2023 1101   AST 65 (H) 04/23/2023 1101   AST 44 (H) 06/08/2021 1338   ALT 65 (H) 04/23/2023 1101   ALT 44 06/08/2021 1338   BILITOT 0.7 04/23/2023 1101   BILITOT 0.6 06/08/2021 1338     Colonoscopy 08/30/2020 normal perianal and digital rectal exams, multiple diverticula found in sigmoid colon and descending colon.  Upper GI endoscopy showed 5 cm hiatal hernia, 2 diminutive sessile polyps with no bleeding, pathology from this polyps were hyperplastic polyps.  Rest of the endoscopy without any concerning findings.    RADIOGRAPHIC STUDIES: I have personally reviewed the radiological images as listed and agreed with the findings in the report. No results found.  All questions were answered. The patient knows to  call the clinic with any problems, questions or concerns. I spent 20 minutes in the care of this patient including H and P, review of records, counseling and coordination of care.     Murleen Arms, MD 06/11/2023 11:32 AM

## 2023-06-22 ENCOUNTER — Other Ambulatory Visit: Payer: Self-pay | Admitting: Cardiology

## 2023-07-02 ENCOUNTER — Telehealth: Payer: Self-pay | Admitting: Family

## 2023-07-02 NOTE — Telephone Encounter (Signed)
 Copied from CRM [id number]. Topic: Medicare AWV >> Jul 02, 2023  3:21 PM Juliana Ocean wrote: Reason for CRM: LVM 07/02/2023 to schedule AWV. Please schedule Virtual or Telehealth visits ONLY  Brandy Dunlap; Care Guide Ambulatory Clinical Support [place] [facility] Direct Dial: [phone]

## 2023-07-06 ENCOUNTER — Other Ambulatory Visit: Payer: Self-pay | Admitting: Cardiology

## 2023-07-09 DIAGNOSIS — J8283 Eosinophilic asthma: Secondary | ICD-10-CM | POA: Diagnosis not present

## 2023-07-09 DIAGNOSIS — J4550 Severe persistent asthma, uncomplicated: Secondary | ICD-10-CM | POA: Diagnosis not present

## 2023-07-09 DIAGNOSIS — J3081 Allergic rhinitis due to animal (cat) (dog) hair and dander: Secondary | ICD-10-CM | POA: Diagnosis not present

## 2023-07-09 DIAGNOSIS — J3089 Other allergic rhinitis: Secondary | ICD-10-CM | POA: Diagnosis not present

## 2023-07-15 DIAGNOSIS — Z96642 Presence of left artificial hip joint: Secondary | ICD-10-CM | POA: Diagnosis not present

## 2023-07-24 DIAGNOSIS — J4540 Moderate persistent asthma, uncomplicated: Secondary | ICD-10-CM | POA: Diagnosis not present

## 2023-07-25 ENCOUNTER — Ambulatory Visit: Admitting: Gastroenterology

## 2023-07-25 ENCOUNTER — Encounter: Payer: Self-pay | Admitting: Gastroenterology

## 2023-07-25 VITALS — BP 115/60 | HR 91 | Ht 63.0 in | Wt 190.6 lb

## 2023-07-25 DIAGNOSIS — R197 Diarrhea, unspecified: Secondary | ICD-10-CM | POA: Diagnosis not present

## 2023-07-25 DIAGNOSIS — R140 Abdominal distension (gaseous): Secondary | ICD-10-CM | POA: Diagnosis not present

## 2023-07-25 DIAGNOSIS — R143 Flatulence: Secondary | ICD-10-CM | POA: Insufficient documentation

## 2023-07-25 DIAGNOSIS — R109 Unspecified abdominal pain: Secondary | ICD-10-CM

## 2023-07-25 DIAGNOSIS — R1084 Generalized abdominal pain: Secondary | ICD-10-CM | POA: Insufficient documentation

## 2023-07-25 NOTE — Progress Notes (Signed)
 07/25/2023 Pa[place] Dorval Denbleyker [id number] May 21, 1950   HISTORY OF PRESENT ILLNESS:  This is a 73 year old female who is a patient of Dr. Jadene Maxwell.  She is here today with complaints of abdominal pain in the mid abdomen with associated diarrhea and gas/bloating for the past few months, no history of similar symptoms.  Says that when this started a few months ago she felt like it was an old-fashioned stomachache.  She says that the stomach pain was quite severe.  Pain has improved, but still present.  Says that today she has had diarrhea all day, yesterday had normal bowel movement.  Says that she feels like her abdomen just gets pumped up with air, gets very bloated.  Denies any nausea or vomiting.  Is on Dexilant  60 mg daily for reflux.  Uses Gas-X which does help some.  No significant changes in diet other than tried to cut some things out to see if it would help her symptoms.  Only new medication is Fasenra , which does not look like has any GI side effects.  Stool studies for Cdiff negative.  Ultrasound okay except for fatty liver.  She does not have a gallbladder, had that out over 10 years ago.  EGD and colonoscopy 2022 looked good as below.   Colonoscopy 08/2020 with diverticulosis.  EGD 08/2020:  - Normal esophagus. - 5 cm hiatal hernia. - Two gastric polyps. Biopsied. and removed - Normal examined duodenum. - The examination was otherwise normal. 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   Chronic neck  pain 03/01/2015   Colon polyp 10/06/2003   Depressive disorder 10/01/2016   PT DENIES   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   Essential hypertension 10/08/2007   Qualifier: Diagnosis of  By: Larrie Po MD, Wilmon Hashimoto    GERD (gastroesophageal reflux disease)    History of kidney stones    per 09-02-2018 pre-op eval , suspected kidney stone , to [facility] CT abd on 7-9- for further eval , passive   Hypertension    IDA (iron  deficiency anemia)    Infectious disease 05/23/2015   Low back pain    Osteoarthritis    Pneumonia    Pre-diabetes    Prediabetes 07/09/2016   Referred otalgia of left ear 03/26/2016   Sinus trouble    Past Surgical History:  Procedure Laterality Date   CHOLECYSTECTOMY     COLONOSCOPY     COLONOSCOPY W/ BIOPSIES  10/06/2003   left knee replacement Left 05/2021   LUMBAR FUSION  2008   LUMBAR FUSION     2020   PARTIAL HIP ARTHROPLASTY Right 2009    hip replacement   TONSILLECTOMY     TOTAL HIP ARTHROPLASTY Left 09/10/2018   Procedure: TOTAL HIP ARTHROPLASTY ANTERIOR APPROACH;  Surgeon: Liliane Rei, MD;  Location: [facility];  Service: Orthopedics;  Laterality: Left;   TOTAL KNEE ARTHROPLASTY Right 11/16/2013   Procedure: RIGHT  TOTAL KNEE ARTHROPLASTY;  Surgeon: Christie Cox, MD;  Location: [facility] OR;  Service: Orthopedics;  Laterality: Right;   VIDEO BRONCHOSCOPY Bilateral 12/05/2012   Procedure: VIDEO BRONCHOSCOPY WITHOUT FLUORO;  Surgeon: Denson Flake, MD;  Location: [facility];  Service: Cardiopulmonary;  Laterality: Bilateral;    reports that she has never smoked. She has never been exposed to tobacco smoke. She has never used smokeless tobacco. She reports that she does not drink alcohol and does not use drugs. family history includes Breast cancer in her maternal aunt; COPD in her father and mother; Cancer in her mother; Emphysema in her father and mother; Heart Problems in her father and mother; Heart disease in her brother and mother; Hyperlipidemia in her mother;  Hypertension in her mother; Ovarian cancer in her mother; Stroke in her mother; Thyroid  disease in her mother. Allergies  Allergen Reactions   Augmentin  [Amoxicillin -Pot Clavulanate] Diarrhea   Pantoprazole Sodium Palpitations      Outpatient Encounter Medications as of 07/25/2023  Medication Sig   amLODipine  (NORVASC ) 5 MG tablet Take 1 tablet (5 mg total) by mouth daily. Patient needs appointment for further refills. 1 st attempt   budesonide -formoterol  (SYMBICORT ) 160-4.5 MCG/ACT inhaler Inhale 2 puffs into the lungs 2 (two) times daily.   DEXILANT  60 MG capsule Take 1 capsule (60 mg total) by mouth daily.   EPINEPHrine  0.3 mg/0.3 mL IJ SOAJ injection Inject 0.3 mg into the muscle as needed for anaphylaxis.   FASENRA  PEN 30 MG/ML prefilled autoinjector 1 injection every 4 weeks x3 doses, then q8 weeks thereafter Subcutaneous every 4 weeks x3 doses, then q8 weeks thereafter for 30 days   hydrochlorothiazide  (MICROZIDE ) 12.5 MG capsule Take 1 capsule (12.5 mg total) by mouth daily. Patient needs appointment for further refills. 1 st attempt   meclizine  (ANTIVERT ) 12.5 MG tablet Take 1 tablet (12.5 mg total) by mouth 3 (three) times daily as needed for dizziness.   traMADol  (ULTRAM ) 50 MG tablet Take 50-100 mg by mouth every 6 (six) hours as needed for pain. (Patient not taking: Reported on 07/25/2023)   [DISCONTINUED] benzonatate  (TESSALON ) 100 MG capsule Take 1 capsule (100 mg total) by mouth 3 (three) times daily as needed.   [DISCONTINUED] montelukast  (SINGULAIR ) 10 MG tablet Take 1 tablet (10 mg total) by mouth at bedtime.   No facility-administered encounter medications on file as of 07/25/2023.    REVIEW OF SYSTEMS  : All other systems reviewed and negative except where noted in the History of Present Illness.   PHYSICAL EXAM: BP 115/60   Pulse 91   Ht 5\' 3"  (1.6 m)   Wt 190 lb 9.6 oz (86.5 kg)   SpO2 97%   BMI 33.76 kg/m  General: Well developed white female in no acute  distress Head: Normocephalic and atraumatic Eyes:  Sclerae anicteric, conjunctiva pink. Ears: Normal auditory acuity Lungs: Clear throughout to auscultation; no W/R/R. Heart: Regular rate and rhythm; no M/R/G. Abdomen: Soft, non-distended.  BS present.  Epigastric and mid-abdominal TTP. Musculoskeletal: Symmetrical with no gross deformities  Skin: No lesions on visible extremities Neurological: Alert oriented x 4, grossly non-focal Psychological:  Alert and cooperative. Normal mood and affect  ASSESSMENT AND PLAN: 73 year old female with complaints of abdominal pain in the mid abdomen with associated diarrhea and gas/bloating for the past few months, no history of similar symptoms.  No significant changes in diet other than tried to cut some things out to see if it would help her symptoms.  Only new medication  is Fasenra , which does not look like has any GI side effects.  Stool studies for Cdiff negative.  Ultrasound okay.  She does not have a gallbladder.  Some of her diarrhea may be a component of bile salt related diarrhea although gallbladder has been out over 10 years.  EGD and colonoscopy 2022 looked good.  Will have her perform SIBO breath test to rule that out.  Can continue to use Gas-X and will give her some samples of IBgard.  If SIBO breath test is negative her pain continues or worsens consider cross-sectional imaging with CT scan.  CC:  Dorrene Gaucher, NP

## 2023-07-25 NOTE — Patient Instructions (Signed)
 IBgard samples.   You have been given a testing kit to check for small intestine bacterial overgrowth (SIBO) which is completed by a company named [facility]. Make sure to return your test in the mail using the return mailing label given to you along with the kit. The test order, your demographic and insurance information have all already been sent to the company. [facility] will collect an upfront charge of $99.74 for commercial insurance plans and $209.74 if you are paying cash. Make sure to discuss with [facility] PRIOR to having the test to see if they have gotten information from your insurance company as to how much your testing will cost out of pocket, if any. Please contact [facility] at phone number [phone] to get instructions regarding how to perform the test as our office is unable to give specific testing instructions.

## 2023-07-26 ENCOUNTER — Ambulatory Visit: Admitting: Family

## 2023-07-26 VITALS — BP 131/59 | HR 79 | Temp 98.7°F | Resp 16 | Ht 63.0 in | Wt 190.0 lb

## 2023-07-26 DIAGNOSIS — H6122 Impacted cerumen, left ear: Secondary | ICD-10-CM | POA: Diagnosis not present

## 2023-07-26 DIAGNOSIS — H00015 Hordeolum externum left lower eyelid: Secondary | ICD-10-CM | POA: Insufficient documentation

## 2023-07-26 MED ORDER — ERYTHROMYCIN 5 MG/GM OP OINT: 1.0000 | TOPICAL_OINTMENT | Freq: Two times a day (BID) | OPHTHALMIC | 0 refills | Status: AC

## 2023-07-26 NOTE — Progress Notes (Signed)
 e  Subjective:     Patient ID: Brandy Dunlap, female    DOB: 1951-01-03, 73 y.o.   MRN: [id number]  Chief Complaint  Patient presents with   Ear Fullness    Patient complains of bilateral ear fullness   Stye    Patient reports having a stye on the left eye    HPI  Discussed the use of AI scribe software for clinical note transcription with the patient, who gave verbal consent to proceed.  History of Present Illness   udy Brandy Dunlap is a 73 year old female who presents with concerns about earwax buildup and a stye.  She has significant earwax buildup, especially in the left ear, and uses a hearing aid in her right ear that requires regular cleaning. She has a history of ear infections.  She developed a painful stye on her eye yesterday, which was initially swollen but has improved with warm compresses. The pain remains severe, and she is avoiding touching it.  Her asthma is managed with Symbicort , and she recently began using Fasenra  every 56 days. She reports no current breathing difficulties related to her asthma.    Health Maintenance Due  Topic Date Due   Hepatitis C Screening  Never done   Zoster Vaccines- Shingrix  (1 of 2) 11/01/2000   DTaP/Tdap/Td (3 - Td or Tdap) 10/31/2020   COVID-19 Vaccine (4 - 2024-25 season) 10/28/2022   Medicare Annual Wellness (AWV)  04/13/2023   MAMMOGRAM  05/25/2023    Past Medical History:  Diagnosis Date   Acute pain of left knee 10/02/2019   Allergic rhinitis 06/29/2009   Qualifier: Diagnosis of  By: Carlyn Childs     Allergic rhinitis due to animal (cat) (dog) hair and dander 07/07/2020   Allergy     Asthma    Atypical chest pain 04/18/2017   B12 deficiency    Back pain    Body mass index (BMI) 35.0-35.9, adult 05/01/2019   Bronchitis    hx   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   Chronic neck pain 03/01/2015   Colon polyp 10/06/2003   Depressive disorder 10/01/2016   PT DENIES   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   Essential hypertension 10/08/2007   Qualifier: Diagnosis of  By: Larrie Po MD, Wilmon Hashimoto    GERD (gastroesophageal reflux disease)    History of kidney stones    per 09-02-2018 pre-op eval , suspected kidney stone , to [facility] CT abd on 7-9- for further eval , passive   Hypertension    IDA (iron  deficiency anemia)    Infectious disease 05/23/2015   Low back pain    Osteoarthritis    Pneumonia    Pre-diabetes    Prediabetes 07/09/2016   Referred otalgia of left ear 03/26/2016   Sinus trouble     Past Surgical History:  Procedure Laterality Date   CHOLECYSTECTOMY     COLONOSCOPY     COLONOSCOPY W/ BIOPSIES  10/06/2003   left knee replacement Left 05/2021   LUMBAR FUSION  2008   LUMBAR FUSION     2020   PARTIAL HIP ARTHROPLASTY Right 2009    hip replacement   TONSILLECTOMY     TOTAL HIP ARTHROPLASTY Left 09/10/2018   Procedure: TOTAL HIP ARTHROPLASTY ANTERIOR APPROACH;  Surgeon: Liliane Rei, MD;  Location: [facility];  Service: Orthopedics;  Laterality: Left;   TOTAL KNEE ARTHROPLASTY Right 11/16/2013   Procedure: RIGHT TOTAL KNEE ARTHROPLASTY;  Surgeon: Christie Cox, MD;  Location: [facility];  Service: Orthopedics;  Laterality: Right;   VIDEO BRONCHOSCOPY Bilateral 12/05/2012   Procedure: VIDEO BRONCHOSCOPY WITHOUT FLUORO;  Surgeon: Denson Flake, MD;  Location: [facility];  Service: Cardiopulmonary;  Laterality: Bilateral;    Family History  Problem Relation Age of Onset   COPD Mother    Heart disease Mother    Ovarian cancer Mother    Emphysema Mother    Hypertension Mother    Hyperlipidemia Mother    Heart Problems Mother    Stroke Mother    Thyroid  disease Mother    Cancer Mother    COPD Father        died at age 18   Emphysema Father    Heart Problems Father    Heart disease Brother        stent with 90%   Breast cancer Maternal Aunt    Colon cancer Neg Hx    Esophageal cancer Neg Hx    Rectal cancer Neg Hx    Stomach cancer Neg Hx    Colon polyps  Neg Hx     Social History   Socioeconomic History   Marital status: Single    Spouse name: Not on file   Number of children: 1   Years of education: Not on file   Highest education level: Bachelor's degree (e.g., BA, AB, BS)  Occupational History   Occupation: Magazine features editor: [employer]  Tobacco Use   Smoking status: Never    Passive exposure: Never   Smokeless tobacco: Never  Vaping Use   Vaping status: Never Used  Substance and Sexual Activity   Alcohol use: No   Drug use: No   Sexual activity: Not Currently    Partners: Male    Birth control/protection: Post-menopausal  Other Topics Concern   Not on file  Social History Narrative   Divorced   One daughter- lives locally and one grandson   Retired Runner, broadcasting/film/video,  Has masters degree   Enjoys reading, spending time with her grandson   Social Drivers of Corporate investment banker Strain: Low Risk  (04/19/2023)   Overall Financial Resource Strain (CARDIA)    Difficulty of Paying Living Expenses: Not hard at all  Food Insecurity: No Food Insecurity (04/19/2023)   Hunger Vital Sign    Worried About Running Out of Food in the Last Year: Never true    Ran Out of Food in the Last Year: Never true  Transportation Needs: No Transportation Needs (04/19/2023)   PRAPARE - Administrator, Civil Service (Medical): No    Lack of Transportation (Non-Medical): No  Physical Activity: Sufficiently Active (04/19/2023)   Exercise Vital Sign    Days of Exercise per Week: 4 days    Minutes of Exercise per Session: 40 min  Stress: No Stress Concern Present (04/19/2023)   [employer] of Occupational Health - Occupational Stress Questionnaire    Feeling of Stress : Not at all  Social Connections: Unknown (04/19/2023)   Social Connection and Isolation Panel [NHANES]    Frequency of Communication with Friends and Family: More than three times a week    Frequency of Social Gatherings with Friends and Family: Twice  a week    Attends Religious Services: Patient declined    Database administrator or Organizations: Yes    Attends Banker Meetings: More than 4 times per year    Marital Status: Divorced  Intimate Partner Violence: Not At Risk (04/12/2022)   Humiliation, Afraid, Rape,  and Kick questionnaire    Fear of Current or Ex-Partner: No    Emotionally Abused: No    Physically Abused: No    Sexually Abused: No    Outpatient Medications Prior to Visit  Medication Sig Dispense Refill   amLODipine  (NORVASC ) 5 MG tablet Take 1 tablet (5 mg total) by mouth daily. Patient needs appointment for further refills. 1 st attempt 30 tablet 0   budesonide -formoterol  (SYMBICORT ) 160-4.5 MCG/ACT inhaler Inhale 2 puffs into the lungs 2 (two) times daily.     DEXILANT  60 MG capsule Take 1 capsule (60 mg total) by mouth daily. 90 capsule 1   EPINEPHrine  0.3 mg/0.3 mL IJ SOAJ injection Inject 0.3 mg into the muscle as needed for anaphylaxis.     FASENRA  PEN 30 MG/ML prefilled autoinjector 1 injection every 4 weeks x3 doses, then q8 weeks thereafter Subcutaneous every 4 weeks x3 doses, then q8 weeks thereafter for 30 days     hydrochlorothiazide  (MICROZIDE ) 12.5 MG capsule Take 1 capsule (12.5 mg total) by mouth daily. Patient needs appointment for further refills. 1 st attempt 30 capsule 0   meclizine  (ANTIVERT ) 12.5 MG tablet Take 1 tablet (12.5 mg total) by mouth 3 (three) times daily as needed for dizziness. 30 tablet 0   traMADol  (ULTRAM ) 50 MG tablet Take 50-100 mg by mouth every 6 (six) hours as needed for pain. (Patient not taking: Reported on 07/25/2023)     No facility-administered medications prior to visit.    Allergies  Allergen Reactions   Augmentin  [Amoxicillin -Pot Clavulanate] Diarrhea   Pantoprazole Sodium Palpitations    ROS See HPI    Objective:     Physical Exam Constitutional:      General: She is not in acute distress.    Appearance: Normal appearance. She is  well-developed.  HENT:     Head: Normocephalic and atraumatic.     Right Ear: Tympanic membrane, ear canal and external ear normal.     Left Ear: External ear normal. There is impacted cerumen.  Eyes:     General: No scleral icterus. Neck:     Thyroid : No thyromegaly.  Cardiovascular:     Rate and Rhythm: Normal rate and regular rhythm.     Heart sounds: Normal heart sounds. No murmur heard. Pulmonary:     Effort: Pulmonary effort is normal. No respiratory distress.     Breath sounds: Normal breath sounds. No wheezing.  Musculoskeletal:     Cervical back: Neck supple.  Skin:    General: Skin is warm and dry.  Neurological:     Mental Status: She is alert and oriented to person, place, and time.  Psychiatric:        Mood and Affect: Mood normal.        Behavior: Behavior normal.        Thought Content: Thought content normal.        Judgment: Judgment normal.    Hordeolum left lower eyelid   BP (!) 131/59 (BP Location: Right Arm, Patient Position: Sitting, Cuff Size: Normal)   Pulse 79   Temp 98.7 F (37.1 C) (Oral)   Resp 16   Ht 5\' 3"  (1.6 m)   Wt 190 lb (86.2 kg)   SpO2 95%   BMI 33.66 kg/m  Wt Readings from Last 3 Encounters:  07/26/23 190 lb (86.2 kg)  07/25/23 190 lb 9.6 oz (86.5 kg)  06/11/23 189 lb 8 oz (86 kg)       Assessment & Plan:  Problem List Items Addressed This Visit       Unprioritized   Hordeolum externum of left lower eyelid - Primary    Acute stye improving with warm compresses. - Prescribed erythromycin  eye ointment, sent to [facility]. - Instructed on eye ointment application. - Advised warm compresses twice daily.      Relevant Medications   erythromycin  ophthalmic ointment   Ceruminosis, left   L ear flushed by CMA after verbal consent. Pt tolerated well.        I am having Brandy Melody. Dunlap start on erythromycin . I am also having her maintain her budesonide -formoterol , traMADol , EPINEPHrine , meclizine , Dexilant , Fasenra  Pen,  amLODipine , and hydrochlorothiazide .  Meds ordered this encounter  Medications   erythromycin  ophthalmic ointment    Sig: Place 1 Application into the left eye 2 (two) times daily for 5 days.    Dispense:  3.5 g    Refill:  0    Supervising Provider:   Randie Bustle A [[id number]]

## 2023-07-26 NOTE — Addendum Note (Signed)
 Addended by: Dorrene Gaucher on: 07/26/2023 05:25 PM   Modules accepted: Level of Service

## 2023-07-26 NOTE — Assessment & Plan Note (Signed)
  Acute stye improving with warm compresses. - Prescribed erythromycin  eye ointment, sent to [facility]. - Instructed on eye ointment application. - Advised warm compresses twice daily.

## 2023-07-26 NOTE — Assessment & Plan Note (Signed)
 L ear flushed by CMA after verbal consent. Pt tolerated well.

## 2023-07-26 NOTE — Patient Instructions (Signed)
 VISIT SUMMARY:  During your visit, we addressed your concerns about earwax buildup and a stye. We also discussed your asthma management and general health maintenance.  YOUR PLAN:  STYE: You have an acute stye that is improving with warm compresses. -I have prescribed an eye ointment, which has been [facility]. -Please apply the eye ointment as instructed. -Continue using warm compresses twice daily.  IMPACTED EARWAX: You have impacted earwax in your left ear due to hearing aid use. -I flushed your left ear to remove the wax.  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 -We are awaiting test results to determine further treatment.  ASTHMA: Your asthma is managed with Symbicort  and Fasenra , and you are responding well to Fasenra . -Continue using Symbicort  and Fasenra  as prescribed.  GENERAL HEALTH MAINTENANCE: We discussed the importance of the Shingrix  vaccine for shingles protection. -I recommend you get the Shingrix  vaccine, which is a two-dose series available at the pharmacy.

## 2023-07-28 DIAGNOSIS — R140 Abdominal distension (gaseous): Secondary | ICD-10-CM | POA: Diagnosis not present

## 2023-07-29 DIAGNOSIS — M25552 Pain in left hip: Secondary | ICD-10-CM | POA: Diagnosis not present

## 2023-08-02 DIAGNOSIS — M25552 Pain in left hip: Secondary | ICD-10-CM | POA: Diagnosis not present

## 2023-08-04 ENCOUNTER — Other Ambulatory Visit: Payer: Self-pay | Admitting: Cardiology

## 2023-08-09 DIAGNOSIS — Z96642 Presence of left artificial hip joint: Secondary | ICD-10-CM | POA: Diagnosis not present

## 2023-08-14 NOTE — Telephone Encounter (Signed)
 Brandy Dunlap have you seen SIBO test results?

## 2023-08-15 ENCOUNTER — Other Ambulatory Visit: Payer: Self-pay | Admitting: Cardiology

## 2023-09-03 ENCOUNTER — Ambulatory Visit (INDEPENDENT_AMBULATORY_CARE_PROVIDER_SITE_OTHER)

## 2023-09-03 VITALS — Ht 63.0 in | Wt 190.0 lb

## 2023-09-03 DIAGNOSIS — Z0000 Encounter for general adult medical examination without abnormal findings: Secondary | ICD-10-CM | POA: Diagnosis not present

## 2023-09-03 NOTE — Patient Instructions (Addendum)
 Brandy Dunlap , Thank you for taking time out of your busy schedule to complete your Annual Wellness Visit with me. I enjoyed our conversation and look forward to speaking with you again next year. I, as well as your care team,  appreciate your ongoing commitment to your health goals. Please review the following plan we discussed and let me know if I can assist you in the future. Your Game plan/ To Do List    Referrals: If you haven't heard from the office you've been referred to, please reach out to them at the phone provided.   Follow up Visits: Next Medicare AWV with our clinical staff: 09/08/24 @ 1:50p   Have you seen your provider in the last 6 months (3 months if uncontrolled diabetes)? 01/28/24 Next Office Visit with your provider:   Clinician Recommendations:  Aim for 30 minutes of exercise or brisk walking, 6-8 glasses of water , and 5 servings of fruits and vegetables each day.       This is a list of the screening recommended for you and due dates:  Health Maintenance  Topic Date Due   Hepatitis C Screening  Never done   Zoster (Shingles) Vaccine (1 of 2) 11/01/2000   DTaP/Tdap/Td vaccine (3 - Td or Tdap) 10/31/2020   COVID-19 Vaccine (4 - 2024-25 season) 10/28/2022   Mammogram  05/25/2023   Flu Shot  09/27/2023   Medicare Annual Wellness Visit  09/02/2024   Pneumococcal Vaccine for age over 95  Completed   DEXA scan (bone density measurement)  Completed   Hepatitis B Vaccine  Aged Out   HPV Vaccine  Aged Out   Meningitis B Vaccine  Aged Out   Colon Cancer Screening  Discontinued    Advanced directives: (Declined) Advance directive discussed with you today. Even though you declined this today, please call our office should you change your mind, and we can give you the proper paperwork for you to fill out. Advance Care Planning is important because it:  [x]  Makes sure you receive the medical care that is consistent with your values, goals, and preferences  [x]  It provides  guidance to your family and loved ones and reduces their decisional burden about whether or not they are making the right decisions based on your wishes.  Follow the link provided in your after visit summary or read over the paperwork we have mailed to you to help you started getting your Advance Directives in place. If you need assistance in completing these, please reach out to us  so that we can help you!  See attachments for Preventive Care and Fall Prevention Tips.

## 2023-09-03 NOTE — Progress Notes (Signed)
 Subjective:   Brandy Dunlap is a 73 y.o. who presents for a Medicare Wellness preventive visit.  As a reminder, Annual Wellness Visits don't include a physical exam, and some assessments may be limited, especially if this visit is performed virtually. We may recommend an in-person follow-up visit with your provider if needed.  Visit Complete: Virtual I connected with  Brandy Dunlap on 09/03/23 by a audio enabled telemedicine application and verified that I am speaking with the correct person using two identifiers.  Patient Location: Home  Provider Location: Home Office  I discussed the limitations of evaluation and management by telemedicine. The patient expressed understanding and agreed to proceed.  Vital Signs: Because this visit was a virtual/telehealth visit, some criteria may be missing or patient reported. Any vitals not documented were not able to be obtained and vitals that have been documented are patient reported.    Persons Participating in Visit: Patient.  AWV Questionnaire: No: Patient Medicare AWV questionnaire was not completed prior to this visit.  Cardiac Risk Factors include: advanced age (>29men, >79 women);hypertension     Objective:    Today's Vitals   09/03/23 1355  Weight: 190 lb (86.2 kg)  Height: 5' 3 (1.6 m)   Body mass index is 33.66 kg/m.     09/03/2023    2:02 PM 11/28/2022   11:12 AM 05/31/2022    8:13 AM 04/12/2022    2:23 PM 11/23/2021    1:22 PM 06/07/2021    2:01 PM 05/15/2021    3:42 PM  Advanced Directives  Does Patient Have a Medical Advance Directive? No No No No No No No  Would patient like information on creating a medical advance directive? No - Patient declined No - Patient declined No - Patient declined No - Patient declined Yes (MAU/Ambulatory/Procedural Areas - Information given) No - Patient declined Yes (MAU/Ambulatory/Procedural Areas - Information given)    Current Medications (verified) Outpatient Encounter  Medications as of 09/03/2023  Medication Sig   amLODipine  (NORVASC ) 5 MG tablet Take 1 tablet (5 mg total) by mouth daily. Please keep appointment on 8/13 with Dr. Krasowski in order to receive future refills. Thank You.   budesonide -formoterol  (SYMBICORT ) 160-4.5 MCG/ACT inhaler Inhale 2 puffs into the lungs 2 (two) times daily.   DEXILANT  60 MG capsule Take 1 capsule (60 mg total) by mouth daily.   EPINEPHrine  0.3 mg/0.3 mL IJ SOAJ injection Inject 0.3 mg into the muscle as needed for anaphylaxis.   FASENRA  PEN 30 MG/ML prefilled autoinjector 1 injection every 4 weeks x3 doses, then q8 weeks thereafter Subcutaneous every 4 weeks x3 doses, then q8 weeks thereafter for 30 days   hydrochlorothiazide  (MICROZIDE ) 12.5 MG capsule TAKE 1 CAPSULE DAILY. PATIENT NEEDS APPOINTMENT FOR FURTHER REFILLS. 1 ST ATTEMPT   meclizine  (ANTIVERT ) 12.5 MG tablet Take 1 tablet (12.5 mg total) by mouth 3 (three) times daily as needed for dizziness.   traMADol  (ULTRAM ) 50 MG tablet Take 50-100 mg by mouth every 6 (six) hours as needed for pain. (Patient not taking: Reported on 07/25/2023)   No facility-administered encounter medications on file as of 09/03/2023.    Allergies (verified) Augmentin  [amoxicillin -pot clavulanate] and Pantoprazole sodium   History: Past Medical History:  Diagnosis Date   Acute pain of left knee 10/02/2019   Allergic rhinitis 06/29/2009   Qualifier: Diagnosis of  By: Arbutus Noon     Allergic rhinitis due to animal (cat) (dog) hair and dander 07/07/2020   Allergy   Asthma    Atypical chest pain 04/18/2017   B12 deficiency    Back pain    Body mass index (BMI) 35.0-35.9, adult 05/01/2019   Bronchitis    hx   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   Chronic neck pain 03/01/2015   Colon polyp 10/06/2003   Depressive disorder 10/01/2016   PT DENIES   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   Essential hypertension 10/08/2007   Qualifier: Diagnosis of  By: Mavis MD, Norleen BRAVO    GERD  (gastroesophageal reflux disease)    History of kidney stones    per 09-02-2018 pre-op eval , suspected kidney stone , to [facility] CT abd on 7-9- for further eval , passive   Hypertension    IDA (iron  deficiency anemia)    Infectious disease 05/23/2015   Low back pain    Osteoarthritis    Pneumonia    Pre-diabetes    Prediabetes 07/09/2016   Referred otalgia of left ear 03/26/2016   Sinus trouble    Past Surgical History:  Procedure Laterality Date   CHOLECYSTECTOMY     COLONOSCOPY     COLONOSCOPY W/ BIOPSIES  10/06/2003   left knee replacement Left 05/2021   LUMBAR FUSION  2008   LUMBAR FUSION     2020   PARTIAL HIP ARTHROPLASTY Right 2009    hip replacement   TONSILLECTOMY     TOTAL HIP ARTHROPLASTY Left 09/10/2018   Procedure: TOTAL HIP ARTHROPLASTY ANTERIOR APPROACH;  Surgeon: Melodi Lerner, MD;  Location: [facility];  Service: Orthopedics;  Laterality: Left;   TOTAL KNEE ARTHROPLASTY Right 11/16/2013   Procedure: RIGHT TOTAL KNEE ARTHROPLASTY;  Surgeon: Marcey Raman, MD;  Location: [facility];  Service: Orthopedics;  Laterality: Right;   VIDEO BRONCHOSCOPY Bilateral 12/05/2012   Procedure: VIDEO BRONCHOSCOPY WITHOUT FLUORO;  Surgeon: Lamar GORMAN Chris, MD;  Location: [facility];  Service: Cardiopulmonary;  Laterality: Bilateral;   Family History  Problem Relation Age of Onset   COPD Mother    Heart disease Mother    Ovarian cancer Mother    Emphysema Mother    Hypertension Mother    Hyperlipidemia Mother    Heart Problems Mother    Stroke Mother    Thyroid  disease Mother    Cancer Mother    COPD Father        died at age 37   Emphysema Father    Heart Problems Father    Heart disease Brother        stent with 90%   Breast cancer Maternal Aunt    Colon cancer Neg Hx    Esophageal cancer Neg Hx    Rectal cancer Neg Hx    Stomach cancer Neg Hx    Colon polyps Neg Hx    Social History   Socioeconomic History   Marital status: Single    Spouse name: Not on file    Number of children: 1   Years of education: Not on file   Highest education level: Bachelor's degree (e.g., BA, AB, BS)  Occupational History   Occupation: Magazine features editor: [employer]  Tobacco Use   Smoking status: Never    Passive exposure: Never   Smokeless tobacco: Never  Vaping Use   Vaping status: Never Used  Substance and Sexual Activity   Alcohol use: No   Drug use: No   Sexual activity: Not Currently    Partners: Male    Birth control/protection: Post-menopausal  Other Topics Concern   Not  on file  Social History Narrative   Divorced   One daughter- lives locally and one grandson   Retired Runner, broadcasting/film/video,  Has masters degree   Enjoys reading, spending time with her grandson   Social Drivers of Corporate investment banker Strain: Low Risk  (09/03/2023)   Overall Financial Resource Strain (CARDIA)    Difficulty of Paying Living Expenses: Not hard at all  Food Insecurity: No Food Insecurity (09/03/2023)   Hunger Vital Sign    Worried About Running Out of Food in the Last Year: Never true    Ran Out of Food in the Last Year: Never true  Transportation Needs: No Transportation Needs (09/03/2023)   PRAPARE - Administrator, Civil Service (Medical): No    Lack of Transportation (Non-Medical): No  Physical Activity: Insufficiently Active (09/03/2023)   Exercise Vital Sign    Days of Exercise per Week: 3 days    Minutes of Exercise per Session: 40 min  Stress: No Stress Concern Present (09/03/2023)   [employer] of Occupational Health - Occupational Stress Questionnaire    Feeling of Stress: Not at all  Social Connections: Moderately Integrated (09/03/2023)   Social Connection and Isolation Panel    Frequency of Communication with Friends and Family: More than three times a week    Frequency of Social Gatherings with Friends and Family: More than three times a week    Attends Religious Services: More than 4 times per year    Active Member of [employer] or  Organizations: Yes    Attends Engineer, structural: More than 4 times per year    Marital Status: Divorced    Tobacco Counseling Counseling given: Not Answered    Clinical Intake:  Pre-visit preparation completed: Yes  Pain : No/denies pain     BMI - recorded: 33.66 Nutritional Status: BMI > 30  Obese Nutritional Risks: None Diabetes: No  Lab Results  Component Value Date   HGBA1C 6.3 01/24/2021   HGBA1C 6.3 04/04/2020   HGBA1C 6.1 (H) 09/04/2018     How often do you need to have someone help you when you read instructions, pamphlets, or other written materials from your doctor or pharmacy?: 1 - Never  Interpreter Needed?: No  Information entered by :: Rojelio Blush LPN   Activities of Daily Living     09/03/2023    2:01 PM  In your present state of health, do you have any difficulty performing the following activities:  Hearing? 1  Comment Wears Hearing Aids  Vision? 0  Difficulty concentrating or making decisions? 0  Walking or climbing stairs? 0  Dressing or bathing? 0  Doing errands, shopping? 0  Preparing Food and eating ? N  Using the Toilet? N  In the past six months, have you accidently leaked urine? N  Do you have problems with loss of bowel control? N  Managing your Medications? N  Managing your Finances? N  Housekeeping or managing your Housekeeping? N    Patient Care Team: O'Sullivan, Melissa, NP as PCP - General (Internal Medicine)  I have updated your Care Teams any recent Medical Services you may have received from other providers in the past year.     Assessment:   This is a routine wellness examination for Brandy Dunlap.  Hearing/Vision screen Hearing Screening - Comments:: Wears Hearing Aids Vision Screening - Comments:: Wears rx glasses - up to date with routine eye exams with  Eye Specialist   Goals Addressed  This Visit's Progress     Increase physical activity (pt-stated)        Remain active        Depression Screen     09/03/2023    2:00 PM 05/18/2022   10:34 AM 04/12/2022    2:26 PM 11/08/2021    2:56 PM 04/11/2021    1:00 PM 01/24/2021    9:04 AM 12/20/2020    2:08 PM  PHQ 2/9 Scores  PHQ - 2 Score 0 0 0 0 0 0 0  PHQ- 9 Score  0     0    Fall Risk     09/03/2023    2:02 PM 05/18/2022   10:34 AM 04/12/2022    2:24 PM 11/08/2021    2:56 PM 04/11/2021    1:02 PM  Fall Risk   Falls in the past year? 0 0 0 0 0  Number falls in past yr: 0 0 0 0 0  Injury with Fall? 0 0 0 0 0  Risk for fall due to : No Fall Risks  No Fall Risks  Impaired vision  Follow up Falls evaluation completed Falls evaluation completed Falls evaluation completed Falls evaluation completed  Falls prevention discussed      Data saved with a previous flowsheet row definition    MEDICARE RISK AT HOME:  Medicare Risk at Home Any stairs in or around the home?: No If so, are there any without handrails?: No Home free of loose throw rugs in walkways, pet beds, electrical cords, etc?: Yes Adequate lighting in your home to reduce risk of falls?: Yes Life alert?: No Use of a cane, walker or w/c?: No Grab bars in the bathroom?: Yes Shower chair or bench in shower?: No Elevated toilet seat or a handicapped toilet?: No  TIMED UP AND GO:  Was the test performed?  No  Cognitive Function: 6CIT completed        09/03/2023    2:02 PM 04/12/2022    2:30 PM 04/11/2021    1:06 PM  6CIT Screen  What Year? 0 points 0 points 0 points  What month? 0 points 0 points 0 points  What time? 0 points 0 points 0 points  Count back from 20 0 points 0 points 0 points  Months in reverse 0 points 0 points 0 points  Repeat phrase 0 points 0 points 2 points  Total Score 0 points 0 points 2 points    Immunizations Immunization History  Administered Date(s) Administered   Fluad Quad(high Dose 65+) 12/19/2018, 12/31/2019, 01/24/2021, 11/29/2021   Fluad Trivalent(High Dose 65+) 11/28/2022   Influenza Split 11/27/2011,  11/26/2012   Influenza, High Dose Seasonal PF 04/07/2021   PFIZER(Purple Top)SARS-COV-2 Vaccination 03/18/2019, 04/14/2019, 12/05/2019   PPD Test 11/16/2016   Pneumococcal Conjugate-13 08/28/2017   Pneumococcal Polysaccharide-23 10/25/2011, 01/24/2021   Td 09/06/2008   Tdap 11/01/2010   Zoster, Live 10/03/2011    Screening Tests Health Maintenance  Topic Date Due   Hepatitis C Screening  Never done   Zoster Vaccines- Shingrix  (1 of 2) 11/01/2000   DTaP/Tdap/Td (3 - Td or Tdap) 10/31/2020   COVID-19 Vaccine (4 - 2024-25 season) 10/28/2022   MAMMOGRAM  05/25/2023   INFLUENZA VACCINE  09/27/2023   Medicare Annual Wellness (AWV)  09/02/2024   Pneumococcal Vaccine: 50+ Years  Completed   DEXA SCAN  Completed   Hepatitis B Vaccines  Aged Out   HPV VACCINES  Aged Out   Meningococcal B Vaccine  Aged Out  Colonoscopy  Discontinued    Health Maintenance  Health Maintenance Due  Topic Date Due   Hepatitis C Screening  Never done   Zoster Vaccines- Shingrix  (1 of 2) 11/01/2000   DTaP/Tdap/Td (3 - Td or Tdap) 10/31/2020   COVID-19 Vaccine (4 - 2024-25 season) 10/28/2022   MAMMOGRAM  05/25/2023   Health Maintenance Items Addressed: Mammogram deferred  Additional Screening:  Vision Screening: Recommended annual ophthalmology exams for early detection of glaucoma and other disorders of the eye. Would you like a referral to an eye doctor? No    Dental Screening: Recommended annual dental exams for proper oral hygiene  Community Resource Referral / Chronic Care Management: CRR required this visit?  No   CCM required this visit?  No   Plan:    I have personally reviewed and noted the following in the patient's chart:   Medical and social history Use of alcohol, tobacco or illicit drugs  Current medications and supplements including opioid prescriptions. Patient is not currently taking opioid prescriptions. Functional ability and status Nutritional status Physical  activity Advanced directives List of other physicians Hospitalizations, surgeries, and ER visits in previous 12 months Vitals Screenings to include cognitive, depression, and falls Referrals and appointments  In addition, I have reviewed and discussed with patient certain preventive protocols, quality metrics, and best practice recommendations. A written personalized care plan for preventive services as well as general preventive health recommendations were provided to patient.   Rojelio LELON Blush, LPN   03/29/7972   After Visit Summary: (MyChart) Due to this being a telephonic visit, the after visit summary with patients personalized plan was offered to patient via MyChart   Notes: Nothing significant to report at this time.

## 2023-09-06 ENCOUNTER — Other Ambulatory Visit: Payer: Self-pay | Admitting: *Deleted

## 2023-09-06 MED ORDER — RIFAXIMIN 550 MG PO TABS: 550.0000 mg | ORAL_TABLET | Freq: Three times a day (TID) | ORAL | 0 refills | Status: DC

## 2023-09-06 MED ORDER — NEOMYCIN SULFATE 500 MG PO TABS: 500.0000 mg | ORAL_TABLET | Freq: Two times a day (BID) | ORAL | 0 refills | Status: AC

## 2023-09-06 MED ORDER — FLUCONAZOLE 150 MG PO TABS: 150.0000 mg | ORAL_TABLET | Freq: Every day | ORAL | 0 refills | Status: DC

## 2023-09-06 NOTE — Telephone Encounter (Signed)
 Diflucan sent to pharmacy.

## 2023-09-06 NOTE — Telephone Encounter (Signed)
-----   Message from [place] D. Zehr sent at 09/04/2023  1:31 PM EDT ----- Please let the patient know that her SIBO breath test is positive, showing that bacterial overgrowth is suspected.  Combined hydrogen and methane components are elevated so I would like to try to treat her with Xifaxan  550 mg 3 times daily x 14 days and neomycin  500 mg twice daily x 14 days.  Thank you,  Jess

## 2023-09-06 NOTE — Telephone Encounter (Signed)
 Informed patient of results. Patient request something be sent in for a yeast infection as well. Please advise.

## 2023-09-09 ENCOUNTER — Encounter: Payer: Self-pay | Admitting: Gastroenterology

## 2023-09-10 ENCOUNTER — Other Ambulatory Visit (HOSPITAL_COMMUNITY): Payer: Self-pay

## 2023-09-10 ENCOUNTER — Telehealth: Payer: Self-pay

## 2023-09-10 ENCOUNTER — Encounter: Payer: Self-pay | Admitting: Hematology and Oncology

## 2023-09-10 NOTE — Telephone Encounter (Signed)
 Pharmacy Patient Advocate Encounter   Received notification from Pt Calls Messages that prior authorization for Xifaxan  550mg  tablets is required/requested.   Insurance verification completed.   The patient is insured through [employer] .   Per test claim: PA required; PA submitted to above mentioned insurance via Fax Key/confirmation #/EOC [id number] Status is pending

## 2023-09-10 NOTE — Telephone Encounter (Signed)
 Pharmacy Patient Advocate Encounter  Received notification from [employer] that Prior Authorization for Xifaxan  550 mg tablets has been APPROVED from 09-10-2023 to 02-26-2024   PA #/Case ID/Reference #: [id number]

## 2023-09-10 NOTE — Telephone Encounter (Signed)
 PA has been submitted.

## 2023-09-11 NOTE — Telephone Encounter (Signed)
 PA approved. Patient notified

## 2023-09-13 DIAGNOSIS — S76812D Strain of other specified muscles, fascia and tendons at thigh level, left thigh, subsequent encounter: Secondary | ICD-10-CM | POA: Diagnosis not present

## 2023-09-15 ENCOUNTER — Other Ambulatory Visit: Payer: Self-pay | Admitting: Cardiology

## 2023-09-20 DIAGNOSIS — J4540 Moderate persistent asthma, uncomplicated: Secondary | ICD-10-CM | POA: Diagnosis not present

## 2023-09-24 ENCOUNTER — Ambulatory Visit: Payer: Medicare PPO | Admitting: Urology

## 2023-10-04 ENCOUNTER — Other Ambulatory Visit: Payer: Self-pay | Admitting: Family

## 2023-10-09 ENCOUNTER — Encounter: Payer: Self-pay | Admitting: Cardiology

## 2023-10-09 ENCOUNTER — Ambulatory Visit: Attending: Cardiology | Admitting: Cardiology

## 2023-10-09 VITALS — BP 134/76 | HR 77 | Ht 63.0 in | Wt 186.0 lb

## 2023-10-09 DIAGNOSIS — E785 Hyperlipidemia, unspecified: Secondary | ICD-10-CM | POA: Diagnosis not present

## 2023-10-09 DIAGNOSIS — I10 Essential (primary) hypertension: Secondary | ICD-10-CM | POA: Diagnosis not present

## 2023-10-09 DIAGNOSIS — J4540 Moderate persistent asthma, uncomplicated: Secondary | ICD-10-CM

## 2023-10-09 NOTE — Patient Instructions (Signed)

## 2023-10-09 NOTE — Progress Notes (Unsigned)
 Cardiology Office Note:    Date:  10/09/2023   ID:  Brandy Dunlap, DOB [birth year], MRN [id number]  PCP:  Daryl Setter, NP  Cardiologist:  Lamar Fitch, MD    Referring MD: Daryl Setter, NP   No chief complaint on file.   History of Present Illness:    Brandy Dunlap is a 73 y.o. female past medical history significant for asthma, iron  deficiency anemia, essential hypertension, dyslipidemia, comes today to months for follow-up cardiac wise doing well the problem is the fact that she developed some chronic hip problem and did slow her down with exercises, she is very upset about this.  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   Essential hypertension 10/08/2007   Qualifier: Diagnosis of  By: Mavis MD, Norleen BRAVO    GERD (gastroesophageal reflux disease)    History of kidney stones    per 09-02-2018 pre-op eval , suspected kidney stone , to [facility] CT abd on 7-9- for further eval , passive   Hypertension    IDA (iron  deficiency anemia)    Infectious disease 05/23/2015   Low back pain    Osteoarthritis    Pneumonia    Pre-diabetes    Prediabetes 07/09/2016   Referred otalgia of left ear 03/26/2016   Sinus trouble     Past Surgical History:  Procedure Laterality Date   CHOLECYSTECTOMY     COLONOSCOPY     COLONOSCOPY W/ BIOPSIES  10/06/2003   left knee replacement Left 05/2021   LUMBAR FUSION  2008    LUMBAR FUSION     2020   PARTIAL HIP ARTHROPLASTY Right 2009    hip replacement   TONSILLECTOMY     TOTAL HIP ARTHROPLASTY Left 09/10/2018   Procedure: TOTAL HIP ARTHROPLASTY ANTERIOR APPROACH;  Surgeon: Melodi Lerner, MD;  Location: [facility];  Service: Orthopedics;  Laterality: Left;   TOTAL KNEE ARTHROPLASTY Right 11/16/2013   Procedure: RIGHT TOTAL KNEE ARTHROPLASTY;  Surgeon: Marcey Raman, MD;  Location: [facility];  Service: Orthopedics;  Laterality: Right;   VIDEO BRONCHOSCOPY Bilateral 12/05/2012   Procedure: VIDEO BRONCHOSCOPY WITHOUT FLUORO;  Surgeon: Lamar GORMAN Chris, MD;  Location: [facility];  Service: Cardiopulmonary;  Laterality: Bilateral;    Current Medications: Current Meds  Medication Sig   amLODipine  (NORVASC ) 5 MG tablet Take 1 tablet (5 mg total) by mouth daily. Please keep appointment on 8/13 with Dr. Chyrel Taha in order to receive future refills. Thank You.   budesonide -formoterol  (SYMBICORT ) 160-4.5 MCG/ACT inhaler Inhale 2 puffs into the lungs 2 (two) times daily.   DEXILANT  60 MG capsule TAKE 1 CAPSULE BY MOUTH EVERY DAY   EPINEPHrine  0.3 mg/0.3 mL IJ SOAJ injection Inject 0.3 mg into the muscle as needed for anaphylaxis.   FASENRA  PEN 30 MG/ML prefilled autoinjector 1 injection every 4 weeks x3 doses, then q8 weeks  thereafter Subcutaneous every 4 weeks x3 doses, then q8 weeks thereafter for 30 days   hydrochlorothiazide  (MICROZIDE ) 12.5 MG capsule Take 1 capsule (12.5 mg total) by mouth daily.   traMADol  (ULTRAM ) 50 MG tablet Take 50-100 mg by mouth every 6 (six) hours as needed for pain.     Allergies:   Augmentin  [amoxicillin -pot clavulanate] and Pantoprazole sodium   Social History   Socioeconomic History   Marital status: Single    Spouse name: Not on file   Number of children: 1   Years of education: Not on file   Highest education level: Bachelor's degree (e.g., BA, AB, BS)  Occupational History   Occupation: Magazine features editor: [employer]  Tobacco Use   Smoking status: Never    Passive exposure: Never   Smokeless tobacco: Never  Vaping Use   Vaping status: Never Used  Substance and Sexual Activity   Alcohol use: No   Drug use: No   Sexual activity: Not Currently    Partners: Male    Birth control/protection: Post-menopausal  Other Topics Concern   Not on file  Social History Narrative   Divorced   One daughter- lives locally and one grandson   Retired Runner, broadcasting/film/video,  Has masters degree   Enjoys reading, spending time with her grandson   Social Drivers of Corporate investment banker Strain: Low Risk  (09/03/2023)   Overall Financial Resource Strain (CARDIA)    Difficulty of Paying Living Expenses: Not hard at all  Food Insecurity: No Food Insecurity (09/03/2023)   Hunger Vital Sign    Worried About Running Out of Food in the Last Year: Never true    Ran Out of Food in the Last Year: Never true  Transportation Needs: No Transportation Needs (09/03/2023)   PRAPARE - Administrator, Civil Service (Medical): No    Lack of Transportation (Non-Medical): No  Physical Activity: Insufficiently Active (09/03/2023)   Exercise Vital Sign    Days of Exercise per Week: 3 days    Minutes of Exercise per Session: 40 min  Stress: No Stress Concern Present (09/03/2023)   [employer] of Occupational Health - Occupational Stress Questionnaire    Feeling of Stress: Not at all  Social Connections: Moderately Integrated (09/03/2023)   Social Connection and Isolation Panel    Frequency of Communication with Friends and Family: More than three times a week    Frequency of Social Gatherings with Friends and Family: More than three times a week    Attends Religious Services: More than 4 times per year    Active Member of [employer] or Organizations: Yes    Attends Engineer, structural: More than 4 times per year    Marital Status: Divorced     Family History: The patient's family history includes Breast cancer in  her maternal aunt; COPD in her father and mother; Cancer in her mother; Emphysema in her father and mother; Heart Problems in her father and mother; Heart disease in her brother and mother; Hyperlipidemia in her mother; Hypertension in her mother; Ovarian cancer in her mother; Stroke in her mother; Thyroid  disease in her mother. There is no history of Colon cancer, Esophageal cancer, Rectal cancer, Stomach cancer, or Colon polyps. ROS:   Please see the history of present illness.    All 14 point review of systems negative except as described per history of present illness  EKGs/Labs/Other Studies Reviewed:    EKG Interpretation Date/Time:  Wednesday October 09 2023 10:20:22 EDT Ventricular Rate:  77 PR Interval:  138 QRS Duration:  86 QT Interval:  394 QTC Calculation: 445 R Axis:   268  Text Interpretation: Normal sinus rhythm Right superior axis deviation Pulmonary disease pattern Nonspecific ST abnormality When compared with ECG of 19-Jun-2020 10:17, No significant change was found Confirmed by Bernie Charleston [phone]) on 10/09/2023 10:47:24 AM    Recent Labs: 04/23/2023: ALT 65; BUN 14; Creatinine, Ser 0.82; Potassium 3.9; Sodium 139 06/11/2023: Hemoglobin 14.4; Platelets 265  Recent Lipid Panel    Component Value Date/Time   CHOL 187 10/31/2021 1101   TRIG 116 10/31/2021 1101   HDL 59 10/31/2021 1101   CHOLHDL 3.2 10/31/2021 1101   CHOLHDL 4 01/24/2021 0939   VLDL 30.2 01/24/2021 0939   LDLCALC 107 (H) 10/31/2021 1101   LDLDIRECT 149.9 09/11/2010 0908    Physical Exam:    VS:  BP 134/76   Pulse 77   Ht 5' 3 (1.6 m)   Wt 186 lb (84.4 kg)   SpO2 97%   BMI 32.95 kg/m     Wt Readings from Last 3 Encounters:  10/09/23 186 lb (84.4 kg)  09/03/23 190 lb (86.2 kg)  07/26/23 190 lb (86.2 kg)     GEN:  Well nourished, well developed in no acute distress HEENT: Normal NECK: No JVD; No carotid bruits LYMPHATICS: No lymphadenopathy CARDIAC: RRR, no murmurs, no rubs, no  gallops RESPIRATORY:  Clear to auscultation without rales, wheezing or rhonchi  ABDOMEN: Soft, non-tender, non-distended MUSCULOSKELETAL:  No edema; No deformity  SKIN: Warm and dry LOWER EXTREMITIES: no swelling NEUROLOGIC:  Alert and oriented x 3 PSYCHIATRIC:  Normal affect   ASSESSMENT:    1. Essential hypertension   2. Dyslipidemia   3. Moderate persistent asthma, uncomplicated    PLAN:    In order of problems listed above:  Essential hypertension blood pressure excellent control continue present management. Dyslipidemia did review K PN which show me her LDL of 1007 HDL 59 this is from September of last year she will go to see her primary care physician in September of this year next month and she will have fasting lipid profile done will wait for results of it.  She did have a calcium score done before which was 0. Her hemoglobin A1c last time in 2022 was 6.3 that also need to be repeated. We did talk in length about need to exercise on the regular basis which she is very much aware of it and she is very eager to go back to exercise obviously because of her hip issue and she have difficulty doing it hopefully will return to exercise   Medication Adjustments/Labs and Tests Ordered: Current medicines are reviewed at length with the patient today.  Concerns regarding medicines are outlined above.  Orders Placed This Encounter  Procedures   EKG 12-Lead   Medication changes: No orders of the defined types were placed in this encounter.   Signed, [place] DOROTHA Bernie, MD, [facility] 10/09/2023 10:56 AM    [facility]

## 2023-10-30 DIAGNOSIS — M79644 Pain in right finger(s): Secondary | ICD-10-CM | POA: Diagnosis not present

## 2023-10-30 DIAGNOSIS — M65311 Trigger thumb, right thumb: Secondary | ICD-10-CM | POA: Diagnosis not present

## 2023-11-06 ENCOUNTER — Other Ambulatory Visit: Payer: Self-pay | Admitting: Cardiology

## 2023-11-13 ENCOUNTER — Other Ambulatory Visit: Payer: Self-pay | Admitting: Family

## 2023-11-13 DIAGNOSIS — H8112 Benign paroxysmal vertigo, left ear: Secondary | ICD-10-CM

## 2023-11-15 ENCOUNTER — Encounter: Payer: Self-pay | Admitting: Family

## 2023-11-15 ENCOUNTER — Telehealth: Payer: Self-pay | Admitting: Family

## 2023-11-15 DIAGNOSIS — J4540 Moderate persistent asthma, uncomplicated: Secondary | ICD-10-CM | POA: Diagnosis not present

## 2023-11-15 DIAGNOSIS — H8112 Benign paroxysmal vertigo, left ear: Secondary | ICD-10-CM

## 2023-11-15 MED ORDER — MECLIZINE HCL 12.5 MG PO TABS: 12.5000 mg | ORAL_TABLET | Freq: Three times a day (TID) | ORAL | 0 refills | Status: DC | PRN

## 2023-11-15 MED ORDER — ONDANSETRON HCL 4 MG PO TABS: 4.0000 mg | ORAL_TABLET | Freq: Three times a day (TID) | ORAL | 0 refills | Status: DC | PRN

## 2023-11-15 NOTE — Telephone Encounter (Signed)
 Can you please see if you can get this pt in sooner with myself or another provider?

## 2023-11-15 NOTE — Telephone Encounter (Unsigned)
 Copied from CRM [id number]. Topic: Referral - Request for Referral >> Nov 15, 2023  9:03 AM Dawna HERO wrote: Did the patient discuss referral with their provider in the last year? Yes (If No - schedule appointment) (If Yes - send message)  Appointment offered? Yes  Type of order/referral and detailed reason for visit: vertigo  Preference of office, provider, location: no specific provider  If referral order, have you been seen by this specialty before? No (If Yes, this issue or another issue? When? Where?  Can we respond through MyChart? Yes

## 2023-11-18 ENCOUNTER — Telehealth: Payer: Self-pay | Admitting: Family

## 2023-11-18 DIAGNOSIS — H8110 Benign paroxysmal vertigo, unspecified ear: Secondary | ICD-10-CM

## 2023-11-18 NOTE — Telephone Encounter (Signed)
Left a voicemail to schedule

## 2023-11-18 NOTE — Telephone Encounter (Signed)
 Referral placed.

## 2023-11-18 NOTE — Telephone Encounter (Signed)
-----   Message from Devere NOVAK sent at 11/18/2023 10:52 AM EDT ----- Regarding: Vertigo Referral HI Eleanor,  Ms. Pilling scheduled an evaluation for pt for vertigo.  Did she ever contact your office to request the referral  Thanks - Devere

## 2023-11-21 ENCOUNTER — Ambulatory Visit: Admitting: Family

## 2023-11-22 ENCOUNTER — Encounter: Payer: Self-pay | Admitting: Medical

## 2023-11-22 ENCOUNTER — Ambulatory Visit: Admitting: Medical

## 2023-11-22 ENCOUNTER — Ambulatory Visit (HOSPITAL_BASED_OUTPATIENT_CLINIC_OR_DEPARTMENT_OTHER)
Admission: RE | Admit: 2023-11-22 | Discharge: 2023-11-22 | Disposition: A | Discharge status: Home / Self Care | Source: Ambulatory Visit | Attending: Medical | Admitting: Medical

## 2023-11-22 VITALS — BP 148/80 | HR 69 | Temp 98.3°F | Resp 16 | Ht 63.0 in | Wt 184.0 lb

## 2023-11-22 DIAGNOSIS — H814 Vertigo of central origin: Secondary | ICD-10-CM | POA: Insufficient documentation

## 2023-11-22 DIAGNOSIS — I10 Essential (primary) hypertension: Secondary | ICD-10-CM

## 2023-11-22 DIAGNOSIS — J3489 Other specified disorders of nose and nasal sinuses: Secondary | ICD-10-CM | POA: Diagnosis not present

## 2023-11-22 DIAGNOSIS — G4452 New daily persistent headache (NDPH): Secondary | ICD-10-CM

## 2023-11-22 DIAGNOSIS — I6782 Cerebral ischemia: Secondary | ICD-10-CM | POA: Diagnosis not present

## 2023-11-22 DIAGNOSIS — R519 Headache, unspecified: Secondary | ICD-10-CM | POA: Insufficient documentation

## 2023-11-22 DIAGNOSIS — R42 Dizziness and giddiness: Secondary | ICD-10-CM | POA: Diagnosis not present

## 2023-11-22 MED ORDER — AZITHROMYCIN 250 MG PO TABS: ORAL_TABLET | ORAL | 0 refills | Status: AC

## 2023-11-22 MED ORDER — METHYLPREDNISOLONE 4 MG PO TABS: ORAL_TABLET | ORAL | 0 refills | Status: DC

## 2023-11-22 NOTE — Patient Instructions (Addendum)
 Vertigo with associated headache - Order CT head without contrast to rule out acute intracranial pathology. Get done today. - Refer to physical therapy for Epley maneuvers, try to refer/schedule early next week. - Advise meclizine  12.5 mg, increase to 25 mg if needed. - Instruct to use Zofran  for nausea or vomiting. - Recommend Tylenol  500 mg for headache, combine with ibuprofen 200 mg if blood pressure controlled. - Advise monitoring for vision changes, slurred speech, weakness, or severe headache, seek emergency care if these occur.  Hypertension Elevated blood pressure, possibly contributing to headaches. Anti-inflammatory medications may be a factor. - Advise home blood pressure monitoring, report readings on Monday. - Consider increasing amlodipine  to 10 mg if blood pressure remains 140-150.  Follow up date to be determined based on ct, response to PT and BP level update on Monday  Ct head impression  1.  No evidence of an acute intracranial abnormality. 2. Moderate chronic small vessel ischemic changes within the cerebral white matter. 3. Paranasal sinus disease at the imaged levels, as described. Small fluid levels within the bilateral maxillary sinuses. Mild mucosal thickening within the left sphenoid sinus.  Pt has no sinus pressure. No treatment necessary presently for sinus. Talked to pt. She states steroid typically help better with sinus than antihbiotic and she does have allergies. Explained if sinus pressure occurs start 3 day tape medrol  and if that does not help add on zpack antibiotic.

## 2023-11-22 NOTE — Progress Notes (Signed)
 Subjective:    Patient ID: Brandy Dunlap, female    DOB: 1950-10-24, 73 y.o.   MRN: [id number]  HPI Brandy Dunlap is a 73 year old female who presents with increased dizziness and headaches.  She has experienced recurrent vertigo since 2023, with episodes occurring approximately every six months. The last episode was in October 2023. This year had been symptom-free until the current episode began last Friday. The current vertigo is described as different, with dizziness and spinning sensations occurring every other night.  The headaches have been present intermittently since last Friday, rated as 4-5 out of 10 in severity. Tylenol  and Advil provide some relief but do not completely alleviate the pain. The headaches are described as more continuous ha than she ever had with vertigo.  She has been using meclizine , 12.5 mg, which helps with dizziness. She took it this morning and noted improvement in dizziness. She also has Zofran  for nausea but has not experienced nausea during this episode.  She has a history of using exercises to reposition ear crystals, which sometimes partially alleviate symptoms. She last saw a physical therapist in October 2024, where she underwent Epley maneuvers.  No vision changes or vomiting. Her blood pressure was noted to be higher than her baseline. She has not checked her blood pressure at home recently.       Review of Systems  Respiratory:  Negative for chest tightness.   Cardiovascular:  Negative for chest pain and palpitations.  Gastrointestinal:  Negative for abdominal pain, nausea and vomiting.  Musculoskeletal:  Negative for back pain and myalgias.  Neurological:  Positive for dizziness. Negative for tremors, seizures and weakness.       Vertigo combination.  Hematological:  Negative for adenopathy.    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   Essential hypertension 10/08/2007   Qualifier: Diagnosis of  By: Mavis MD, Norleen BRAVO    GERD (gastroesophageal reflux disease)    History of kidney stones    per 09-02-2018 pre-op eval , suspected kidney stone , to [facility] abd on 7-9- for further eval , passive   Hypertension    IDA (iron  deficiency anemia)    Infectious disease 05/23/2015   Low back pain    Osteoarthritis    Pneumonia    Pre-diabetes    Prediabetes 07/09/2016   Referred otalgia of left ear 03/26/2016   Sinus trouble      Social History   Socioeconomic History   Marital status: Single    Spouse name: Not on file   Number of children: 1   Years of education: Not on file   Highest education level: Bachelor's degree (e.g., BA, AB, BS)  Occupational History   Occupation: Magazine features editor: [employer]  Tobacco Use   Smoking status: Never    Passive exposure: Never  Smokeless tobacco: Never  Vaping Use   Vaping status: Never Used  Substance and Sexual Activity   Alcohol use: No   Drug use: No   Sexual activity: Not Currently    Partners: Male    Birth control/protection: Post-menopausal  Other Topics Concern   Not on file  Social History Narrative   Divorced   One daughter- lives locally and one grandson   Retired Runner, broadcasting/film/video,  Has masters degree   Enjoys reading, spending time with her grandson   Social Drivers of Corporate investment banker Strain: Low Risk  (11/21/2023)   Overall Financial Resource Strain (CARDIA)    Difficulty of Paying Living  Expenses: Not hard at all  Food Insecurity: No Food Insecurity (11/21/2023)   Hunger Vital Sign    Worried About Running Out of Food in the Last Year: Never true    Ran Out of Food in the Last Year: Never true  Transportation Needs: No Transportation Needs (11/21/2023)   PRAPARE - Administrator, Civil Service (Medical): No    Lack of Transportation (Non-Medical): No  Physical Activity: Insufficiently Active (11/21/2023)   Exercise Vital Sign    Days of Exercise per Week: 3 days    Minutes of Exercise per Session: 30 min  Stress: No Stress Concern Present (11/21/2023)   [employer] of Occupational Health - Occupational Stress Questionnaire    Feeling of Stress: Not at all  Social Connections: Moderately Isolated (11/21/2023)   Social Connection and Isolation Panel    Frequency of Communication with Friends and Family: More than three times a week    Frequency of Social Gatherings with Friends and Family: Three times a week    Attends Religious Services: Patient declined    Active Member of Clubs or Organizations: Yes    Attends Banker Meetings: More than 4 times per year    Marital Status: Divorced  Intimate Partner Violence: Not At Risk (09/03/2023)   Humiliation, Afraid, Rape, and Kick questionnaire    Fear of Current or Ex-Partner: No    Emotionally Abused: No    Physically Abused: No    Sexually Abused: No    Past Surgical History:  Procedure Laterality Date   CHOLECYSTECTOMY     COLONOSCOPY     COLONOSCOPY W/ BIOPSIES  10/06/2003   left knee replacement Left 05/2021   LUMBAR FUSION  2008   LUMBAR FUSION     2020   PARTIAL HIP ARTHROPLASTY Right 2009    hip replacement   TONSILLECTOMY     TOTAL HIP ARTHROPLASTY Left 09/10/2018   Procedure: TOTAL HIP ARTHROPLASTY ANTERIOR APPROACH;  Surgeon: Melodi Lerner, MD;  Location: [facility];  Service: Orthopedics;  Laterality: Left;   TOTAL KNEE ARTHROPLASTY Right 11/16/2013   Procedure: RIGHT TOTAL KNEE  ARTHROPLASTY;  Surgeon: Marcey Raman, MD;  Location: [facility];  Service: Orthopedics;  Laterality: Right;   VIDEO BRONCHOSCOPY Bilateral 12/05/2012   Procedure: VIDEO BRONCHOSCOPY WITHOUT FLUORO;  Surgeon: Lamar GORMAN Chris, MD;  [facility];  Service: Cardiopulmonary;  Laterality: Bilateral;    Family History  Problem Relation Age of Onset   COPD Mother    Heart disease Mother    Ovarian cancer Mother    Emphysema Mother    Hypertension Mother    Hyperlipidemia Mother    Heart Problems Mother    Stroke Mother    Thyroid  disease Mother    Cancer Mother    COPD Father  died at age 16   Emphysema Father    Heart Problems Father    Heart disease Brother        stent with 90%   Breast cancer Maternal Aunt    Colon cancer Neg Hx    Esophageal cancer Neg Hx    Rectal cancer Neg Hx    Stomach cancer Neg Hx    Colon polyps Neg Hx     Allergies  Allergen Reactions   Augmentin  [Amoxicillin -Pot Clavulanate] Diarrhea   Pantoprazole Sodium Palpitations    Current Outpatient Medications on File Prior to Visit  Medication Sig Dispense Refill   amLODipine  (NORVASC ) 5 MG tablet TAKE 1 TABLET BY MOUTH DAILY. KEEP APPOINTMENT ON 8/13 WITH DR. BERNIE TO RECEIVE FUTURE REFILLS 90 tablet 3   budesonide -formoterol  (SYMBICORT ) 160-4.5 MCG/ACT inhaler Inhale 2 puffs into the lungs 2 (two) times daily.     DEXILANT  60 MG capsule TAKE 1 CAPSULE BY MOUTH EVERY DAY 90 capsule 1   EPINEPHrine  0.3 mg/0.3 mL IJ SOAJ injection Inject 0.3 mg into the muscle as needed for anaphylaxis.     FASENRA  PEN 30 MG/ML prefilled autoinjector 1 injection every 4 weeks x3 doses, then q8 weeks thereafter Subcutaneous every 4 weeks x3 doses, then q8 weeks thereafter for 30 days     hydrochlorothiazide  (MICROZIDE ) 12.5 MG capsule Take 1 capsule (12.5 mg total) by mouth daily. 90 capsule 0   meclizine  (ANTIVERT ) 12.5 MG tablet Take 1 tablet (12.5 mg total) by mouth 3 (three) times daily as needed for  dizziness. 30 tablet 0   ondansetron  (ZOFRAN ) 4 MG tablet Take 1 tablet (4 mg total) by mouth every 8 (eight) hours as needed for nausea or vomiting. 20 tablet 0   traMADol  (ULTRAM ) 50 MG tablet Take 50-100 mg by mouth every 6 (six) hours as needed for pain.     No current facility-administered medications on file prior to visit.    BP (!) 148/80   Pulse 69   Temp 98.3 F (36.8 C) (Oral)   Resp 16   Ht 5' 3 (1.6 m)   Wt 184 lb (83.5 kg)   SpO2 98%   BMI 32.59 kg/m          Objective:   Physical Exam  General Mental Status- Alert. General Appearance- Not in acute distress.   Skin General: Color- Normal Color. Moisture- Normal Moisture.  Neck . No JVD.  Chest and Lung Exam Auscultation: Breath Sounds:-Normal.  Cardiovascular Auscultation:Rythm- Regular. Murmurs & Other Heart Sounds:Auscultation of the heart reveals- No Murmurs.  Abdomen Inspection:-Inspeection Normal. Palpation/Percussion:Note:No mass. Palpation and Percussion of the abdomen reveal- Non Tender, Non Distended + BS, no rebound or guarding.    Neurologic Cranial Nerve exam:- CN III-XII intact(No nystagmus), symmetric smile. Drift Test:- No drift. Finger to Nose:- Normal/Intact Strength:- 5/5 equal and symmetric strength both upper and lower extremities.  On lying supine and turning head to the right vertigo induced but then resolves when head returns midline.       Assessment & Plan:  [phone]  Patient Instructions  Vertigo with associated headache - Order CT head without contrast to rule out acute intracranial pathology. Get done today. - Refer to physical therapy for Epley maneuvers, try to refer/schedule early next week. - Advise meclizine  12.5 mg, increase to 25 mg if needed. - Instruct to use Zofran  for nausea or vomiting. - Recommend Tylenol  500 mg for headache, combine with ibuprofen 200 mg if blood pressure controlled. - Advise monitoring for  vision changes, slurred  speech, weakness, or severe headache, seek emergency care if these occur.  Hypertension Elevated blood pressure, possibly contributing to headaches. Anti-inflammatory medications may be a factor. - Advise home blood pressure monitoring, report readings on Monday. - Consider increasing amlodipine  to 10 mg if blood pressure remains 140-150.  Follow up date to be determined based on ct, response to PT and BP level update on monday   I personally spent a total of 45 minutes in the care of the patient today including performing a medically appropriate exam/evaluation, counseling and educating, placing orders, and documenting clinical information in the EHR.

## 2023-11-23 ENCOUNTER — Ambulatory Visit: Payer: Self-pay | Admitting: Medical

## 2023-11-26 ENCOUNTER — Encounter: Payer: Self-pay | Admitting: Podiatry

## 2023-11-26 ENCOUNTER — Ambulatory Visit: Admitting: Podiatry

## 2023-11-26 DIAGNOSIS — L600 Ingrowing nail: Secondary | ICD-10-CM

## 2023-11-26 DIAGNOSIS — M19071 Primary osteoarthritis, right ankle and foot: Secondary | ICD-10-CM

## 2023-11-26 MED ORDER — NEOMYCIN-POLYMYXIN-HC 1 % OT SOLN: OTIC | 1 refills | Status: DC

## 2023-11-26 NOTE — Progress Notes (Signed)
 She presents today having an episode of vertigo chief concern of painful ingrown toenail medial border of the hallux right is red swollen has been hurting for 2 weeks now.  Objective: Vital signs are stable alert oriented x 3 there is moderate erythema tenderness on palpation with a sharply abraded nail margin along the tibial border of the hallux right.  No purulence no malodor visible.  Assessment: Ingrown toenail tibial border hallux left.  Plan: Chemical matricectomy was performed today after local anesthesia was administered.  She tolerated procedure well as the nail was split from distal to proximal along the tibial border and avulsed exposing the matrix and the nailbed.  Once this was exposed we then applied 3 applications of phenol 30 seconds each to the nail matrix and the nailbed neutralizing with isopropyl alcohol.  Silvadene cream Telfa pad and dressed a compressive dressing was applied.  She was given both oral and written home-going instructions for care and soaking the toe as well as a prescription for Cortisporin Otic to be applied twice daily after soaking.  I will follow-up with her in 2 to 3 weeks.

## 2023-11-26 NOTE — Patient Instructions (Signed)

## 2023-12-02 ENCOUNTER — Encounter: Payer: Self-pay | Admitting: Rehabilitation

## 2023-12-02 ENCOUNTER — Other Ambulatory Visit: Payer: Self-pay

## 2023-12-02 ENCOUNTER — Ambulatory Visit: Attending: Family | Admitting: Rehabilitation

## 2023-12-02 DIAGNOSIS — H814 Vertigo of central origin: Secondary | ICD-10-CM | POA: Diagnosis not present

## 2023-12-02 DIAGNOSIS — R42 Dizziness and giddiness: Secondary | ICD-10-CM | POA: Insufficient documentation

## 2023-12-02 DIAGNOSIS — H8110 Benign paroxysmal vertigo, unspecified ear: Secondary | ICD-10-CM | POA: Diagnosis not present

## 2023-12-02 DIAGNOSIS — H8111 Benign paroxysmal vertigo, right ear: Secondary | ICD-10-CM | POA: Insufficient documentation

## 2023-12-02 DIAGNOSIS — R262 Difficulty in walking, not elsewhere classified: Secondary | ICD-10-CM | POA: Insufficient documentation

## 2023-12-02 NOTE — Therapy (Signed)
 OUTPATIENT PHYSICAL THERAPY VESTIBULAR EVALUATION     Patient Name: Brandy Dunlap MRN: [id number] DOB:11/05/1950, 73 y.o., female Today's Date: 12/02/2023  END OF SESSION:  PT End of Session - 12/02/23 0848     Visit Number 1    Date for Recertification  01/27/24    PT Start Time 0847    PT Stop Time 0928    PT Time Calculation (min) 41 min          Past Medical History:  Diagnosis Date   Acute pain of left knee 10/02/2019   Allergic rhinitis 06/29/2009   Qualifier: Diagnosis of  By: Arbutus Noon     Allergic rhinitis due to animal (cat) (dog) hair and dander 07/07/2020   Allergy     Asthma    Atypical chest pain 04/18/2017   B12 deficiency    Back pain    Body mass index (BMI) 35.0-35.9, adult 05/01/2019   Bronchitis    hx   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   Chronic neck pain 03/01/2015   Colon polyp 10/06/2003   Depressive disorder 10/01/2016   PT DENIES   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   Essential hypertension 10/08/2007   Qualifier: Diagnosis of  By: Mavis MD, Norleen BRAVO    GERD (gastroesophageal reflux disease)    History of kidney stones    per 09-02-2018 pre-op eval , suspected kidney stone , to [facility] CT abd on 7-9- for further eval , passive   Hypertension    IDA (iron  deficiency anemia)    Infectious disease 05/23/2015   Low back pain    Osteoarthritis    Pneumonia    Pre-diabetes    Prediabetes 07/09/2016   Referred otalgia of left ear 03/26/2016   Sinus trouble    Past Surgical History:  Procedure Laterality Date   CHOLECYSTECTOMY     COLONOSCOPY     COLONOSCOPY W/ BIOPSIES  10/06/2003   left knee replacement Left 05/2021   LUMBAR FUSION  2008   LUMBAR FUSION     2020   PARTIAL HIP ARTHROPLASTY Right 2009    hip replacement   TONSILLECTOMY     TOTAL HIP ARTHROPLASTY Left 09/10/2018   Procedure: TOTAL HIP ARTHROPLASTY ANTERIOR APPROACH;  Surgeon: Melodi Lerner, MD;  Location: [facility];  Service: Orthopedics;  Laterality:  Left;   TOTAL KNEE ARTHROPLASTY Right 11/16/2013   Procedure: RIGHT TOTAL KNEE ARTHROPLASTY;  Surgeon: Marcey Raman, MD;  Location: [facility];  Service: Orthopedics;  Laterality: Right;   VIDEO BRONCHOSCOPY Bilateral 12/05/2012   Procedure: VIDEO BRONCHOSCOPY WITHOUT FLUORO;  Surgeon: Lamar GORMAN Chris, MD;  Location: [facility];  Service: Cardiopulmonary;  Laterality: Bilateral;   Patient Active Problem List   Diagnosis Date Noted   Hordeolum externum of left lower eyelid 07/26/2023   Ceruminosis, left 07/26/2023   Generalized abdominal pain 07/25/2023   Flatulence 07/25/2023   Bloating 07/25/2023   Diarrhea 07/25/2023   Epigastric pain 04/23/2023   Recurrent UTI 09/25/2022   Right flank pain 09/25/2022   Strep throat 05/18/2022   COVID-19 virus infection 01/31/2022   Thrombosis of popliteal vein (HCC) 09/18/2021   Urinary urgency 08/01/2021   Benign paroxysmal positional vertigo 05/08/2021   Acute serous otitis media of left ear 05/08/2021   Preoperative examination 04/05/2021   Urine frequency 03/10/2021   Dyslipidemia 10/04/2020   Allergic rhinitis due to animal (cat) (dog) hair and dander 07/07/2020   Allergic rhinitis due to pollen 07/07/2020   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   Moderate persistent  asthma, uncomplicated 07/07/2020   Pre-diabetes    History of kidney stones    Bronchitis with bronchospasm    IDA (iron  deficiency anemia) 07/06/2020   Spinal stenosis, lumbar region without neurogenic claudication 06/04/2019   Body mass index (BMI) 35.0-35.9, adult 05/01/2019   Degenerative scoliosis in adult patient 05/01/2019   Asthma 04/18/2017   Cervical spondylosis without myelopathy 09/23/2015   Infectious disease 05/23/2015   Morbid obesity (HCC) 04/25/2015   Preop cardiovascular exam 10/25/2014   History of total knee arthroplasty 11/16/2013   Osteoarthrosis, localized, primary, knee, right 11/06/2013   Cough 06/26/2012   History of total hip arthroplasty  05/14/2012   Cobalamin deficiency 09/21/2009   Allergic rhinitis 06/29/2009   Arthralgia of left temporomandibular joint 03/19/2008   Essential hypertension 10/08/2007   OSTEOARTHRITIS 10/08/2007   Osteoarthritis of hip 10/08/2007   Low back pain 07/24/2007   Gastroesophageal reflux disease 07/24/2007   Colon polyp 10/06/2003    PCP: [facility] REFERRING PROVIDER: [facility]  REFERRING DIAG: H81.10 (ICD-10-CM) - Benign paroxysmal positional vertigo, unspecified laterality  THERAPY DIAG:  BPPV (benign paroxysmal positional vertigo), right  Dizziness and giddiness  Difficulty in walking, not elsewhere classified  ONSET DATE: last 1-2 months  Rationale for Evaluation and Treatment: Rehabilitation  SUBJECTIVE:   SUBJECTIVE STATEMENT: Patient is referred to PT from PCP for BPPV.  Patient reports she gets BPPV vertigo about every 6 months and has been here in the past for CRT/Epley maneuver with good results and resolution of her symptoms.  She has had her symptoms now for 1-2 months.  They are not as severe as on prior occasions, but she reports room spinning and general dizziness with rolling onto her R side and with getting up from supine positions.   She denies any N/V or falls.   She gets some dizziness thru the day as well with position changes.   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   She is driving without any dizziness per her report.   She lives with her daughter's family and grandchild.  There are no steps to enter the residence.   Pt accompanied by: self  PERTINENT HISTORY: B TKA, L THA, lumbar fusion, HTN, asthma, OA, BPPV  PAIN:  Are you having pain? No  PRECAUTIONS: None  RED FLAGS: None   WEIGHT BEARING RESTRICTIONS: No  FALLS: Has patient fallen in last 6 months? No  LIVING ENVIRONMENT: Lives with: lives with their family Lives in:  House/apartment Stairs: No Has following equipment at home: None  PLOF: Independent with gait  PATIENT GOALS: get the crystals back in the right place so I won't be dizzy  OBJECTIVE:  Note: Objective measures were completed at Evaluation unless otherwise noted.  DIAGNOSTIC FINDINGS: Narrative & Impression CLINICAL DATA:  Provided history: Vertigo of central origin (concern for central cause with recent constant headache). Headache, increasing frequency or severity. Vertigo, central. Vertigo and constant headache.   EXAM: CT HEAD WITHOUT CONTRAST   TECHNIQUE: Contiguous axial images were obtained from the base of the skull through the vertex without intravenous contrast.   RADIATION DOSE REDUCTION: This exam was performed according to the departmental dose-optimization program which includes automated exposure control, adjustment of the mA and/or kV according to patient size and/or use of iterative reconstruction technique.   COMPARISON:  None.   FINDINGS: Brain:   No age-advanced or lobar predominant  cerebral atrophy.   Patchy and ill-defined hypoattenuation within the cerebral white matter, nonspecific but compatible with moderate chronic small vessel ischemic disease.   There is no acute intracranial hemorrhage.   No demarcated cortical infarct.   No extra-axial fluid collection.   No evidence of an intracranial mass.   No midline shift.   Vascular: No hyperdense vessel.  Atherosclerotic calcifications.   Skull: No calvarial fracture or aggressive osseous lesion.   Sinuses/Orbits: No mass or acute finding within the imaged orbits. Small fluid levels within the bilateral maxillary sinuses. Mild mucosal thickening within the left sphenoid sinus.   IMPRESSION: 1.  No evidence of an acute intracranial abnormality. 2. Moderate chronic small vessel ischemic changes within the cerebral white matter. 3. Paranasal sinus disease at the imaged levels, as  described.     Electronically Signed   By: Rockey Childs D.O.   On: 11/22/2023 12:28  COGNITION: Overall cognitive status: Within functional limits for tasks assessed   SENSATION: WFL  EDEMA:  na  MUSCLE TONE:  WNL  DTRs:  NT  POSTURE:  rounded shoulders and forward head  Cervical ROM:    Active A/PROM (deg) eval  Flexion 100%  Extension 50%  Right lateral flexion 50%  Left lateral flexion 50%  Right rotation 70%  Left rotation 70%  (Blank rows = not tested)  STRENGTH: NT BED MOBILITY:  Sit to supine Complete Independence Supine to sit Complete Independence  TRANSFERS: Assistive device utilized: None  Sit to stand: Complete Independence Stand to sit: Complete Independence Chair to chair: Complete Independence Floor: NT GAIT: Gait pattern: WFL Distance walked: Into clinic Assistive device utilized: None Level of assistance: Complete Independence Comments: short steps, little head or trunk movement  FUNCTIONAL TESTS:  TBD PRN  PATIENT SURVEYS:  DHI: THE DIZZINESS HANDICAP INVENTORY (DHI)  P1. Does looking up increase your problem? 2 = Sometimes  E2. Because of your problem, do you feel frustrated? 2 = Sometimes  F3. Because of your problem, do you restrict your travel for business or recreation?  0 = No  P4. Does walking down the aisle of a supermarket increase your problems?  0 = No  F5. Because of your problem, do you have difficulty getting into or out of bed?  0 = No  F6. Does your problem significantly restrict your participation in social activities, such as going out to dinner, going to the movies, dancing, or going to parties? 2 = Sometimes  F7. Because of your problem, do you have difficulty reading?  0 = No  P8. Does performing more ambitious activities such as sports, dancing, household chores (sweeping or putting dishes away) increase your problems?  0 = No  E9. Because of your problem, are you afraid to leave your home without having  without having someone accompany you?  0 = No  E10. Because of your problem have you been embarrassed in front of others?  0 = No  P11. Do quick movements of your head increase your problem?  2 = Sometimes  F12. Because of your problem, do you avoid heights?  0 = No  P13. Does turning over in bed increase your problem?  2 = Sometimes  F14. Because of your problem, is it difficult for you to do strenuous homework or yard work? 0 = No  E15. Because of your problem, are you afraid people may think you are intoxicated? 0 = No  F16. Because of your problem, is it difficult for you to  go for a walk by yourself?  0 = No  P17. Does walking down a sidewalk increase your problem?  0 = No  E18.Because of your problem, is it difficult for you to concentrate 0 = No  F19. Because of your problem, is it difficult for you to walk around your house in the dark? 0 = No  E20. Because of your problem, are you afraid to stay home alone?  0 = No  E21. Because of your problem, do you feel handicapped? 0 = No  E22. Has the problem placed stress on your relationships with members of your family or friends? 0 = No  E23. Because of your problem, are you depressed?  0 = No  F24. Does your problem interfere with your job or household responsibilities?  0 = No  P25. Does bending over increase your problem?  2 = Sometimes  TOTAL 12    DHI Scoring Instructions  The patient is asked to answer each question as it pertains to dizziness or unsteadiness problems, specifically  considering their condition during the last month. Questions are designed to incorporate functional (F), physical  (P), and emotional (E) impacts on disability.   Scores greater than 10 points should be referred to balance specialists for further evaluation.   16-34 Points (mild handicap)  36-52 Points (moderate handicap)  54+ Points (severe handicap)  Minimally Detectable Change: 17 points ([address] [place], 1990)  [place], G. SHAUNNA. and [place], C.  W. (1990). The development of the Dizziness Handicap Inventory. Archives of Otolaryngology - Head and Neck Surgery 116(4): [id number].   VESTIBULAR ASSESSMENT:  GENERAL OBSERVATION: well appearing 73 y/o patient   SYMPTOM BEHAVIOR:  Subjective history: gets dizziness as well as sensation room is spinning  Non-Vestibular symptoms: denies any   Type of dizziness: Spinning/Vertigo and Swimmyheaded  Frequency: multiple times daily feels dizzy for variable time frames;  spinning sensation is with position changes  Duration: variable lasting from only a few seconds to 20-30 min  Aggravating factors: Induced by position change: rolling to the right and Induced by motion: bending down to the ground  Relieving factors: head stationary  Progression of symptoms: unchanged  OCULOMOTOR EXAM:   Ocular Alignment: normal  Spontaneous Nystagmus: absent  Gaze-Induced Nystagmus: absent  Horizontal head shaking - induced nystagmus: negative, but did cause some vertigo  Vertical head shaking - induced nystagmus: negative  Positional tests: Right Dix-Hallpike: pure upbeating Left Dix-Hallpike: no nystagmus Right Roll Test: no nystagmus Left Roll Test: no nystagmus  VESTIBULAR - OCULAR REFLEX:   Slow VOR: Normal  VOR Cancellation: Corrective Saccades loses gaze stability to the R and L saccade to correct  Head-Impulse Test: HIT Right: no nystagmus, but dizziness  Dynamic Visual Acuity: Static: Line 5 (but forgot to bring glasses)    MOTION SENSITIVITY:  Motion Sensitivity Quotient Intensity: 0 = none, 1 = Lightheaded, 2 = Mild, 3 = Moderate, 4 = Severe, 5 = Vomiting  Intensity  1. Sitting to supine   2. Supine to L side   3. Supine to R side   4. Supine to sitting   5. L Hallpike-Dix   6. Up from L    7. R Hallpike-Dix   8. Up from R    9. Sitting, head tipped to L knee   10. Head up from L knee   11. Sitting, head tipped to R knee   12. Head up from R knee   13. Sitting head turns x5  14.Sitting head nods x5   15. In stance, 180 turn to L    16. In stance, 180 turn to R     OTHOSTATICS: nt  FUNCTIONAL GAIT: TBD                                                                                                                             TREATMENT DATE:  12/02/23 SELF CARE: Provided education on PT POC progression/ on proper technique for CRT for R BPPV with head extended off of the bed rather than over 1 or 2 flat pillows as she has been doing;  keeping her head rotated into the proper position with each part of the maneuver and keeping neck extended throughout.   Canalith Repositioning:  Epley Right: Number of Reps: 3  no symptoms by last time   PATIENT EDUCATION: Education details: Quarry manager Person educated: Patient Education method: Explanation, Demonstration, Tactile cues, and Verbal cues Education comprehension: verbalized understanding, verbal cues required, tactile cues required, and needs further education  HOME EXERCISE PROGRAM:  GOALS: Goals reviewed with patient? Yes  SHORT TERM GOALS: Target date: 12/30/2023   Patient will report 50% subjective improvement in her symptoms of vertigo and dizziness Baseline: occurs always when rolling to the R and when sitting up from lying down Goal status: INITIAL  2.  Patient will demo 75% cervical ROM Baseline: see ROM tables above Goal status: INITIAL  3. Patient will be independent with initial HEP Baseline: 100% PT assist required for correct completion Goal status: INITIAL   LONG TERM GOALS: Target date: 01/27/2024   Patient will report 75-100% subjective improvement in her vertigo/dizziness symptoms Baseline: always with rolling R and supine to sit Goal status: INITIAL  2.  Patient will be independent with advanced HEP Baseline: nmo advanced HEP yet Goal status: INITIAL  3.  Patient will exhibit score of >24/30 on FGA Baseline: TBD Goal status: INITIAL  4.  Patient will exhibit score  of >/= 50/56 on BERG balance test Baseline: TBD Goal status: INITIAL  ASSESSMENT:  CLINICAL IMPRESSION: Patient is a 73 y.o. who was seen today for physical therapy evaluation and treatment for vertigo symptoms.   Patient has had these symptoms off/on over the last several years and has had PT here previously with good results from Epley maneuver.  She is not as bad this time around as before, but is still not able to roll to the R in bed or transfer in/out of bed without significant vertigo.   She has + R hallpike Dix test today with upbeating nystagmus, and responds well to initial Epley maneuver for this.  She is advised this may not fully resolve her symptoms, and that we would like to see her again for f/u.   She does also have some nystagmus with gaze stabilization today so there may be some underlying neurological deficits as well, but it is very mild. She would benefit from PT to address  dizziness, vertigo, decreased gaze stability, and some underlying balance deficits.   She is agreeable to the POC.    OBJECTIVE IMPAIRMENTS: difficulty walking and dizziness.   ACTIVITY LIMITATIONS: bending, sleeping, and locomotion level  PARTICIPATION LIMITATIONS: cleaning, laundry, and community activity  PERSONAL FACTORS: Age, Fitness, Time since onset of injury/illness/exacerbation, and 1-2 comorbidities: B TKA, L THA, lumbar fusion, HTN, asthma, OA, BPPV are also affecting patient's functional outcome.   REHAB POTENTIAL: Good  CLINICAL DECISION MAKING: Evolving/moderate complexity  EVALUATION COMPLEXITY: Moderate   PLAN:  PT FREQUENCY: 1-2x/week  PT DURATION: 8 weeks  PLANNED INTERVENTIONS: 97110-Therapeutic exercises, 97530- Therapeutic activity, [id number]- Neuromuscular re-education, 97535- Self Care, [id number]- Manual therapy, [phone]- Canalith repositioning, 20560 (1-2 muscles), [id number] (3+ muscles)- Dry Needling, Patient/Family education, Balance training, and Stair training  PLAN FOR NEXT  SESSION: Reassess hallpike Dix bilaterally w/ epley as indicated.  Add gaze stabilization actrivities.   Further check strength in UE, cervical palpation,    Janine Reller, PT 12/02/2023, 2:49 PM

## 2023-12-04 ENCOUNTER — Ambulatory Visit: Admitting: Family

## 2023-12-04 ENCOUNTER — Encounter: Payer: Self-pay | Admitting: Family

## 2023-12-04 VITALS — BP 123/61 | HR 78 | Temp 99.1°F | Resp 16 | Ht 63.0 in | Wt 185.8 lb

## 2023-12-04 DIAGNOSIS — R739 Hyperglycemia, unspecified: Secondary | ICD-10-CM

## 2023-12-04 DIAGNOSIS — R7303 Prediabetes: Secondary | ICD-10-CM | POA: Diagnosis not present

## 2023-12-04 DIAGNOSIS — Z13220 Encounter for screening for lipoid disorders: Secondary | ICD-10-CM | POA: Diagnosis not present

## 2023-12-04 DIAGNOSIS — H8110 Benign paroxysmal vertigo, unspecified ear: Secondary | ICD-10-CM | POA: Diagnosis not present

## 2023-12-04 DIAGNOSIS — E66811 Obesity, class 1: Secondary | ICD-10-CM

## 2023-12-04 DIAGNOSIS — J4540 Moderate persistent asthma, uncomplicated: Secondary | ICD-10-CM | POA: Diagnosis not present

## 2023-12-04 NOTE — Progress Notes (Signed)
 Subjective:     Patient ID: Brandy Dunlap, female    DOB: Feb 10, 1951, 73 y.o.   MRN: [id number]  Chief Complaint  Patient presents with   Dizziness    Here for follow up    Results    Will like to go over CT results     Dizziness    Discussed the use of AI scribe software for clinical note transcription with the patient, who gave verbal consent to proceed.  History of Present Illness    Brandy Dunlap is a 73 year old female who presents for follow-up on her CT scan and vertigo management.  She experiences ongoing dizziness despite the cessation of the spinning sensation. Physical therapy sometimes results in severe headaches, and she has been unable to exercise for nearly five months due to vertigo. She is scheduled to return to physical therapy tomorrow. A CT scan of her head was performed on September 26th.  She is concerned about her cholesterol levels, as it has been two years since her last test, and she plans to have her cholesterol and A1c levels checked. Her breathing is stable on Symbicort , and she finds the new asthma medication, Fasenra , very effective. She has been losing weight, currently weighing approximately 181 pounds. This year has been particularly challenging for her health.  CT:   1.  No evidence of an acute intracranial abnormality. 2. Moderate chronic small vessel ischemic changes within the cerebral white matter. 3. Paranasal sinus disease at the imaged levels, as described.    Health Maintenance Due  Topic Date Due   Hepatitis C Screening  Never done   DTaP/Tdap/Td (3 - Td or Tdap) 10/31/2020   Mammogram  05/25/2023   COVID-19 Vaccine (4 - 2025-26 season) 10/28/2023    Past Medical History:  Diagnosis Date   Acute pain of left knee 10/02/2019   Allergic rhinitis 06/29/2009   Qualifier: Diagnosis of  By: Arbutus Noon     Allergic rhinitis due to animal (cat) (dog) hair and dander 07/07/2020   Allergy     Asthma    Atypical  chest pain 04/18/2017   B12 deficiency    Back pain    Body mass index (BMI) 35.0-35.9, adult 05/01/2019   Bronchitis    hx   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   Chronic neck pain 03/01/2015   Colon polyp 10/06/2003   COVID-19 virus infection 01/31/2022   Depressive disorder 10/01/2016   PT DENIES   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   Essential hypertension 10/08/2007   Qualifier: Diagnosis of  By: Mavis MD, Norleen BRAVO    GERD (gastroesophageal reflux disease)    History of kidney stones    per 09-02-2018 pre-op eval , suspected kidney stone , to [facility] CT abd on 7-9- for further eval , passive   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   History of total knee arthroplasty 11/16/2013   Hypertension    IDA (iron  deficiency anemia)    Infectious disease 05/23/2015   Low back pain    Osteoarthritis    Pneumonia    Pre-diabetes    Prediabetes 07/09/2016   Referred otalgia of left ear 03/26/2016   Sinus trouble     Past Surgical History:  Procedure Laterality Date   CHOLECYSTECTOMY     COLONOSCOPY     COLONOSCOPY W/ BIOPSIES  10/06/2003   left knee replacement Left 05/2021   LUMBAR FUSION  2008   LUMBAR FUSION     2020   PARTIAL  HIP ARTHROPLASTY Right 2009    hip replacement   TONSILLECTOMY     TOTAL HIP ARTHROPLASTY Left 09/10/2018   Procedure: TOTAL HIP ARTHROPLASTY ANTERIOR APPROACH;  Surgeon: Melodi Lerner, MD;  Location: [facility];  Service: Orthopedics;  Laterality: Left;   TOTAL KNEE ARTHROPLASTY Right 11/16/2013   Procedure: RIGHT TOTAL KNEE ARTHROPLASTY;  Surgeon: Marcey Raman, MD;  Location: [facility];  Service: Orthopedics;  Laterality: Right;   VIDEO BRONCHOSCOPY Bilateral 12/05/2012   Procedure: VIDEO BRONCHOSCOPY WITHOUT FLUORO;  Surgeon: Lamar GORMAN Chris, MD;  Location: [facility];  Service: Cardiopulmonary;  Laterality: Bilateral;    Family History  Problem Relation Age of Onset   COPD Mother    Heart disease Mother    Ovarian cancer Mother     Emphysema Mother    Hypertension Mother    Hyperlipidemia Mother    Heart Problems Mother    Stroke Mother    Thyroid  disease Mother    Cancer Mother    COPD Father        died at age 76   Emphysema Father    Heart Problems Father    Heart disease Brother        stent with 90%   Breast cancer Maternal Aunt    Colon cancer Neg Hx    Esophageal cancer Neg Hx    Rectal cancer Neg Hx    Stomach cancer Neg Hx    Colon polyps Neg Hx     Social History   Socioeconomic History   Marital status: Single    Spouse name: Not on file   Number of children: 1   Years of education: Not on file   Highest education level: Bachelor's degree (e.g., BA, AB, BS)  Occupational History   Occupation: Magazine features editor: [employer]  Tobacco Use   Smoking status: Never    Passive exposure: Never   Smokeless tobacco: Never  Vaping Use   Vaping status: Never Used  Substance and Sexual Activity   Alcohol use: No   Drug use: No   Sexual activity: Not Currently    Partners: Male    Birth control/protection: Post-menopausal  Other Topics Concern   Not on file  Social History Narrative   Divorced   One daughter- lives locally and one grandson   Retired Runner, broadcasting/film/video,  Has masters degree   Enjoys reading, spending time with her grandson   Social Drivers of Corporate investment banker Strain: Low Risk  (11/21/2023)   Overall Financial Resource Strain (CARDIA)    Difficulty of Paying Living Expenses: Not hard at all  Food Insecurity: No Food Insecurity (11/21/2023)   Hunger Vital Sign    Worried About Running Out of Food in the Last Year: Never true    Ran Out of Food in the Last Year: Never true  Transportation Needs: No Transportation Needs (11/21/2023)   PRAPARE - Administrator, Civil Service (Medical): No    Lack of Transportation (Non-Medical): No  Physical Activity: Insufficiently Active (11/21/2023)   Exercise Vital Sign    Days of Exercise per Week: 3 days     Minutes of Exercise per Session: 30 min  Stress: No Stress Concern Present (11/21/2023)   [employer] of Occupational Health - Occupational Stress Questionnaire    Feeling of Stress: Not at all  Social Connections: Moderately Isolated (11/21/2023)   Social Connection and Isolation Panel    Frequency of Communication with Friends and Family: More  than three times a week    Frequency of Social Gatherings with Friends and Family: Three times a week    Attends Religious Services: Patient declined    Active Member of Clubs or Organizations: Yes    Attends Banker Meetings: More than 4 times per year    Marital Status: Divorced  Intimate Partner Violence: Not At Risk (09/03/2023)   Humiliation, Afraid, Rape, and Kick questionnaire    Fear of Current or Ex-Partner: No    Emotionally Abused: No    Physically Abused: No    Sexually Abused: No    Outpatient Medications Prior to Visit  Medication Sig Dispense Refill   AIRSUPRA 90-80 MCG/ACT AERO INHALE 2 PUFFS AS NEEDED UP TO 12 PUFFS A DAY; Duration: 30     amLODipine  (NORVASC ) 5 MG tablet TAKE 1 TABLET BY MOUTH DAILY. KEEP APPOINTMENT ON 8/13 WITH DR. BERNIE TO RECEIVE FUTURE REFILLS 90 tablet 3   budesonide -formoterol  (SYMBICORT ) 160-4.5 MCG/ACT inhaler Inhale 2 puffs into the lungs 2 (two) times daily.     DEXILANT  60 MG capsule TAKE 1 CAPSULE BY MOUTH EVERY DAY 90 capsule 1   EPINEPHrine  0.3 mg/0.3 mL IJ SOAJ injection Inject 0.3 mg into the muscle as needed for anaphylaxis.     FASENRA  PEN 30 MG/ML prefilled autoinjector 1 injection every 4 weeks x3 doses, then q8 weeks thereafter Subcutaneous every 4 weeks x3 doses, then q8 weeks thereafter for 30 days     hydrochlorothiazide  (MICROZIDE ) 12.5 MG capsule Take 1 capsule (12.5 mg total) by mouth daily. 90 capsule 0   meclizine  (ANTIVERT ) 12.5 MG tablet Take 1 tablet (12.5 mg total) by mouth 3 (three) times daily as needed for dizziness. 30 tablet 0   meloxicam  (MOBIC ) 15 MG  tablet Take 15 mg by mouth daily.     montelukast  (SINGULAIR ) 10 MG tablet TAKE 1 TABLET EVERY EVENING Orally Once a day; Duration: 30 days     traMADol  (ULTRAM ) 50 MG tablet Take 50-100 mg by mouth every 6 (six) hours as needed for pain.     methocarbamol  (ROBAXIN ) 500 MG tablet Take 500 mg by mouth 3 (three) times daily as needed.     NEOMYCIN -POLYMYXIN-HYDROCORTISONE (CORTISPORIN) 1 % SOLN OTIC solution Apply 1-2 drops to toe BID after soaking 10 mL 1   ondansetron  (ZOFRAN ) 4 MG tablet Take 1 tablet (4 mg total) by mouth every 8 (eight) hours as needed for nausea or vomiting. 20 tablet 0   No facility-administered medications prior to visit.    Allergies  Allergen Reactions   Augmentin  [Amoxicillin -Pot Clavulanate] Diarrhea   Pantoprazole Sodium Palpitations    Review of Systems  Neurological:  Positive for dizziness.      See HPI Objective:    Physical Exam Constitutional:      General: She is not in acute distress.    Appearance: Normal appearance. She is well-developed.  HENT:     Head: Normocephalic and atraumatic.     Right Ear: External ear normal.     Left Ear: External ear normal.  Eyes:     General: No scleral icterus. Neck:     Thyroid : No thyromegaly.  Cardiovascular:     Rate and Rhythm: Normal rate and regular rhythm.     Heart sounds: Normal heart sounds. No murmur heard. Pulmonary:     Effort: Pulmonary effort is normal. No respiratory distress.     Breath sounds: Normal breath sounds. No wheezing.  Musculoskeletal:     Cervical  back: Neck supple.  Skin:    General: Skin is warm and dry.  Neurological:     Mental Status: She is alert and oriented to person, place, and time.  Psychiatric:        Mood and Affect: Mood normal.        Behavior: Behavior normal.        Thought Content: Thought content normal.        Judgment: Judgment normal.      BP 123/61 (BP Location: Left Arm, Patient Position: Sitting, Cuff Size: Small)   Pulse 78   Temp  99.1 F (37.3 C) (Oral)   Resp 16   Ht 5' 3 (1.6 m)   Wt 185 lb 12.8 oz (84.3 kg)   SpO2 97%   BMI 32.91 kg/m  Wt Readings from Last 3 Encounters:  12/04/23 185 lb 12.8 oz (84.3 kg)  11/22/23 184 lb (83.5 kg)  10/09/23 186 lb (84.4 kg)       Assessment & Plan:   Problem List Items Addressed This Visit       Unprioritized   Pre-diabetes   Will update A1C today.       Obesity (BMI 30.0-34.9)   Wt Readings from Last 3 Encounters:  12/04/23 185 lb 12.8 oz (84.3 kg)  11/22/23 184 lb (83.5 kg)  10/09/23 186 lb (84.4 kg)   She continues to work on weight loss.       Moderate persistent asthma, uncomplicated   Improved on Fasenra , advair- followed by Allergist.        Benign paroxysmal positional vertigo   Noted some improvement following her first Vestibular therapy treatment.  Advised pt to continue to work on her PT and let me know if symptoms do not resolve.       Other Visit Diagnoses       Screening for lipoid disorders    -  Primary   Relevant Orders   Lipid panel     Hyperglycemia       Relevant Orders   HgB A1c   Basic Metabolic Panel (BMET)       I have discontinued Jacquette G. Kubisiak's ondansetron , methocarbamol , and NEOMYCIN -POLYMYXIN-HYDROCORTISONE. I am also having her maintain her budesonide -formoterol , traMADol , EPINEPHrine , Fasenra  Pen, hydrochlorothiazide , Dexilant , amLODipine , meclizine , Airsupra, meloxicam , and montelukast .  No orders of the defined types were placed in this encounter.

## 2023-12-04 NOTE — Assessment & Plan Note (Signed)
 Noted some improvement following her first Vestibular therapy treatment.  Advised pt to continue to work on her PT and let me know if symptoms do not resolve.

## 2023-12-04 NOTE — Assessment & Plan Note (Signed)
Will update A1C today.

## 2023-12-04 NOTE — Assessment & Plan Note (Signed)
 Improved on Fasenra , advair- followed by Allergist.

## 2023-12-04 NOTE — Assessment & Plan Note (Signed)
 Wt Readings from Last 3 Encounters:  12/04/23 185 lb 12.8 oz (84.3 kg)  11/22/23 184 lb (83.5 kg)  10/09/23 186 lb (84.4 kg)   She continues to work on weight loss.

## 2023-12-04 NOTE — Patient Instructions (Addendum)
 VISIT SUMMARY:  You came in for a follow-up on your CT scan and vertigo management. We discussed your ongoing dizziness, asthma management, and concerns about your cholesterol and blood sugar levels.  YOUR PLAN:  BENIGN PAROXYSMAL POSITIONAL VERTIGO (BPPV): You are experiencing persistent dizziness despite reduced spinning from physical therapy. -Continue with your physical therapy sessions. -Report if dizziness persists after completing therapy.  ASTHMA: Your asthma is well-controlled with Symbicort  and FastNorm. -Continue using Symbicort  and FastNorm as prescribed.  CHRONIC SMALL VESSEL DISEASE OF BRAIN: Your CT scan showed chronic small vessel disease, which is common with age, high blood pressure, and high cholesterol. There are no acute concerns. -No immediate action needed. Sugar, blood pressure control are important.   HYPERLIPIDEMIA: Your cholesterol levels have not been checked in two years, and re-evaluation is needed to manage your cardiovascular risk. -A cholesterol panel has been ordered.  SCREENING FOR DIABETES MELLITUS: Due to your weight loss, we need to screen for diabetes. -An A1c test has been ordered. -A kidney function test has been ordered.

## 2023-12-05 ENCOUNTER — Other Ambulatory Visit: Payer: Self-pay | Admitting: Family

## 2023-12-05 ENCOUNTER — Ambulatory Visit: Payer: Self-pay | Admitting: Rehabilitation

## 2023-12-05 ENCOUNTER — Encounter: Payer: Self-pay | Admitting: Rehabilitation

## 2023-12-05 DIAGNOSIS — R262 Difficulty in walking, not elsewhere classified: Secondary | ICD-10-CM | POA: Diagnosis not present

## 2023-12-05 DIAGNOSIS — H8111 Benign paroxysmal vertigo, right ear: Secondary | ICD-10-CM | POA: Diagnosis not present

## 2023-12-05 DIAGNOSIS — H814 Vertigo of central origin: Secondary | ICD-10-CM | POA: Diagnosis not present

## 2023-12-05 DIAGNOSIS — R42 Dizziness and giddiness: Secondary | ICD-10-CM

## 2023-12-05 DIAGNOSIS — E119 Type 2 diabetes mellitus without complications: Secondary | ICD-10-CM | POA: Insufficient documentation

## 2023-12-05 DIAGNOSIS — E785 Hyperlipidemia, unspecified: Secondary | ICD-10-CM

## 2023-12-05 DIAGNOSIS — H8110 Benign paroxysmal vertigo, unspecified ear: Secondary | ICD-10-CM | POA: Diagnosis not present

## 2023-12-05 LAB — LIPID PANEL
Cholesterol: 212 mg/dL — ABNORMAL HIGH (ref 0–200)
HDL: 51.5 mg/dL (ref >39.00)
LDL Cholesterol: 131 mg/dL — ABNORMAL HIGH (ref 0–99)
NonHDL: 160.08
Total CHOL/HDL Ratio: 4
Triglycerides: 147 mg/dL (ref 0.0–149.0)
VLDL: 29.4 mg/dL (ref 0.0–40.0)

## 2023-12-05 LAB — BASIC METABOLIC PANEL WITH GFR
BUN: 7 mg/dL (ref 6–23)
CO2: 28 meq/L (ref 19–32)
Calcium: 9.6 mg/dL (ref 8.4–10.5)
Chloride: 102 meq/L (ref 96–112)
Creatinine, Ser: 0.72 mg/dL (ref 0.40–1.20)
GFR: 83.14 mL/min (ref >60.00)
Glucose, Bld: 66 mg/dL — ABNORMAL LOW (ref 70–99)
Potassium: 4.1 meq/L (ref 3.5–5.1)
Sodium: 140 meq/L (ref 135–145)

## 2023-12-05 LAB — HEMOGLOBIN A1C: Hgb A1c MFr Bld: 6.5 % (ref 4.6–6.5)

## 2023-12-05 MED ORDER — ATORVASTATIN CALCIUM 20 MG PO TABS: 20.0000 mg | ORAL_TABLET | Freq: Every day | ORAL | 1 refills | Status: AC

## 2023-12-05 NOTE — Therapy (Addendum)
 OUTPATIENT PHYSICAL THERAPY VESTIBULAR EVALUATION / DC SUMMARY     Patient Name: Brandy Dunlap MRN: [id number] DOB:1950-10-21, 73 y.o., female Today's Date: 12/07/2023  END OF SESSION:     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   Essential hypertension 10/08/2007   Qualifier: Diagnosis of  By: Mavis MD, Norleen BRAVO    GERD (gastroesophageal reflux disease)    History of kidney stones    per 09-02-2018 pre-op eval , suspected kidney stone , to [facility] CT abd on 7-9- for further eval , passive   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   History of total knee arthroplasty 11/16/2013   Hypertension    IDA (iron  deficiency anemia)    Infectious disease 05/23/2015   Low back pain    Osteoarthritis    Pneumonia    Pre-diabetes    Prediabetes 07/09/2016   Referred otalgia of left ear 03/26/2016   Sinus trouble    Past Surgical History:  Procedure Laterality Date   CHOLECYSTECTOMY     COLONOSCOPY     COLONOSCOPY W/ BIOPSIES  10/06/2003   left knee replacement Left 05/2021   LUMBAR FUSION  2008   LUMBAR FUSION     2020   PARTIAL HIP ARTHROPLASTY Right 2009    hip replacement   TONSILLECTOMY     TOTAL HIP ARTHROPLASTY Left 09/10/2018   Procedure: TOTAL HIP ARTHROPLASTY ANTERIOR APPROACH;  Surgeon: Melodi Lerner, MD;  Location: [facility];  Service: Orthopedics;  Laterality: Left;   TOTAL KNEE ARTHROPLASTY  Right 11/16/2013   Procedure: RIGHT TOTAL KNEE ARTHROPLASTY;  Surgeon: Marcey Raman, MD;  Location: [facility];  Service: Orthopedics;  Laterality: Right;   VIDEO BRONCHOSCOPY Bilateral 12/05/2012   Procedure: VIDEO BRONCHOSCOPY WITHOUT FLUORO;  Surgeon: Lamar GORMAN Chris, MD;  Location: [facility];  Service: Cardiopulmonary;  Laterality: Bilateral;   Patient Active Problem List   Diagnosis Date Noted   Controlled type 2 diabetes mellitus without complication, without long-term current use of insulin  (HCC) 12/05/2023   Recurrent UTI 09/25/2022   Thrombosis of popliteal vein (HCC) 09/18/2021   Benign paroxysmal positional vertigo 05/08/2021   Dyslipidemia 10/04/2020   Allergic rhinitis due to animal (cat) (dog) hair and dander 07/07/2020   Allergic rhinitis due to pollen 07/07/2020   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   Pre-diabetes    History of kidney stones    IDA (iron  deficiency anemia) 07/06/2020   Spinal stenosis, lumbar region without neurogenic claudication 06/04/2019   Degenerative scoliosis in adult patient 05/01/2019   Moderate persistent asthma, uncomplicated 04/18/2017   Cervical spondylosis without myelopathy 09/23/2015   Obesity (BMI 30.0-34.9) 04/25/2015   Cobalamin deficiency 09/21/2009   Arthralgia of left temporomandibular joint 03/19/2008   Essential hypertension 10/08/2007   OSTEOARTHRITIS 10/08/2007   Osteoarthritis of hip 10/08/2007   Gastroesophageal reflux disease 07/24/2007  PCP: [facility] REFERRING PROVIDER: [facility]  REFERRING DIAG: H81.10 (ICD-10-CM) - Benign paroxysmal positional vertigo, unspecified laterality  THERAPY DIAG:  BPPV (benign paroxysmal positional vertigo), right  Dizziness and giddiness  Difficulty in walking, not elsewhere classified  ONSET DATE: last 1-2 months  Rationale for Evaluation and Treatment: Rehabilitation  SUBJECTIVE:   SUBJECTIVE STATEMENT: Patient reports still having vertigo room spinning feeling everytime  she rolls over to the R.   General dizziness without room spinning at other times throughout the day.   She is understandably frustrated  EVAL:  Patient is referred to PT from PCP for BPPV.  Patient reports she gets BPPV vertigo about every 6 months and has been here in the past for CRT/Epley maneuver with good results and resolution of her symptoms.  She has had her symptoms now for 1-2 months.  They are not as severe as on prior occasions, but she reports room spinning and general dizziness with rolling onto her R side and with getting up from supine positions.   She denies any N/V or falls.   She gets some dizziness thru the day as well with position changes.   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   She is driving without any dizziness per her report.   She lives with her daughter's family and grandchild.  There are no steps to enter the residence.   Pt accompanied by: self  PERTINENT HISTORY: B TKA, L THA, lumbar fusion, HTN, asthma, OA, BPPV  PAIN:  Are you having pain? No  PRECAUTIONS: None  RED FLAGS: None   WEIGHT BEARING RESTRICTIONS: No  FALLS: Has patient fallen in last 6 months? No  LIVING ENVIRONMENT: Lives with: lives with their family Lives in: House/apartment Stairs: No Has following equipment at home: None  PLOF: Independent with gait  PATIENT GOALS: get the crystals back in the right place so I won't be dizzy  OBJECTIVE:  Note: Objective measures were completed at Evaluation unless otherwise noted.  DIAGNOSTIC FINDINGS: Narrative & Impression CLINICAL DATA:  Provided history: Vertigo of central origin (concern for central cause with recent constant headache). Headache, increasing frequency or severity. Vertigo, central. Vertigo and constant headache.   EXAM: CT HEAD WITHOUT CONTRAST   TECHNIQUE: Contiguous axial images were obtained from  the base of the skull through the vertex without intravenous contrast.   RADIATION DOSE REDUCTION: This exam was performed according to the departmental dose-optimization program which includes automated exposure control, adjustment of the mA and/or kV according to patient size and/or use of iterative reconstruction technique.   COMPARISON:  None.   FINDINGS: Brain:   No age-advanced or lobar predominant cerebral atrophy.   Patchy and ill-defined hypoattenuation within the cerebral white matter, nonspecific but compatible with moderate chronic small vessel ischemic disease.   There is no acute intracranial hemorrhage.   No demarcated cortical infarct.   No extra-axial fluid collection.   No evidence of an intracranial mass.   No midline shift.   Vascular: No hyperdense vessel.  Atherosclerotic calcifications.   Skull: No calvarial fracture or aggressive osseous lesion.   Sinuses/Orbits: No mass or acute finding within the imaged orbits. Small fluid levels within the bilateral maxillary sinuses. Mild mucosal thickening within the left sphenoid sinus.   IMPRESSION: 1.  No evidence of an acute intracranial abnormality. 2. Moderate chronic small vessel ischemic changes within the cerebral white  matter. 3. Paranasal sinus disease at the imaged levels, as described.     Electronically Signed   By: Rockey Childs D.O.   On: 11/22/2023 12:28  COGNITION: Overall cognitive status: Within functional limits for tasks assessed   SENSATION: WFL  EDEMA:  na  MUSCLE TONE:  WNL  DTRs:  NT  POSTURE:  rounded shoulders and forward head  Cervical ROM:    Active A/PROM (deg) eval  Flexion 100%  Extension 50%  Right lateral flexion 50%  Left lateral flexion 50%  Right rotation 70%  Left rotation 70%  (Blank rows = not tested)  STRENGTH: NT BED MOBILITY:  Sit to supine Complete Independence Supine to sit Complete Independence  TRANSFERS: Assistive device  utilized: None  Sit to stand: Complete Independence Stand to sit: Complete Independence Chair to chair: Complete Independence Floor: NT GAIT: Gait pattern: WFL Distance walked: Into clinic Assistive device utilized: None Level of assistance: Complete Independence Comments: short steps, little head or trunk movement  FUNCTIONAL TESTS:  TBD PRN  PATIENT SURVEYS:  DHI: THE DIZZINESS HANDICAP INVENTORY (DHI)  P1. Does looking up increase your problem? 2 = Sometimes  E2. Because of your problem, do you feel frustrated? 2 = Sometimes  F3. Because of your problem, do you restrict your travel for business or recreation?  0 = No  P4. Does walking down the aisle of a supermarket increase your problems?  0 = No  F5. Because of your problem, do you have difficulty getting into or out of bed?  0 = No  F6. Does your problem significantly restrict your participation in social activities, such as going out to dinner, going to the movies, dancing, or going to parties? 2 = Sometimes  F7. Because of your problem, do you have difficulty reading?  0 = No  P8. Does performing more ambitious activities such as sports, dancing, household chores (sweeping or putting dishes away) increase your problems?  0 = No  E9. Because of your problem, are you afraid to leave your home without having without having someone accompany you?  0 = No  E10. Because of your problem have you been embarrassed in front of others?  0 = No  P11. Do quick movements of your head increase your problem?  2 = Sometimes  F12. Because of your problem, do you avoid heights?  0 = No  P13. Does turning over in bed increase your problem?  2 = Sometimes  F14. Because of your problem, is it difficult for you to do strenuous homework or yard work? 0 = No  E15. Because of your problem, are you afraid people may think you are intoxicated? 0 = No  F16. Because of your problem, is it difficult for you to go for a walk by yourself?  0 = No  P17. Does  walking down a sidewalk increase your problem?  0 = No  E18.Because of your problem, is it difficult for you to concentrate 0 = No  F19. Because of your problem, is it difficult for you to walk around your house in the dark? 0 = No  E20. Because of your problem, are you afraid to stay home alone?  0 = No  E21. Because of your problem, do you feel handicapped? 0 = No  E22. Has the problem placed stress on your relationships with members of your family or friends? 0 = No  E23. Because of your problem, are you depressed?  0 = No  F24.  Does your problem interfere with your job or household responsibilities?  0 = No  P25. Does bending over increase your problem?  2 = Sometimes  TOTAL 12    DHI Scoring Instructions  The patient is asked to answer each question as it pertains to dizziness or unsteadiness problems, specifically  considering their condition during the last month. Questions are designed to incorporate functional (F), physical  (P), and emotional (E) impacts on disability.   Scores greater than 10 points should be referred to balance specialists for further evaluation.   16-34 Points (mild handicap)  36-52 Points (moderate handicap)  54+ Points (severe handicap)  Minimally Detectable Change: 17 points ([address] [place], 1990)  [place], G. SHAUNNA. and [place], C. W. (1990). The development of the Dizziness Handicap Inventory. Archives of Otolaryngology - Head and Neck Surgery 116(4): [id number].   VESTIBULAR ASSESSMENT:  GENERAL OBSERVATION: well appearing 73 y/o patient   SYMPTOM BEHAVIOR:  Subjective history: gets dizziness as well as sensation room is spinning  Non-Vestibular symptoms: denies any   Type of dizziness: Spinning/Vertigo and Swimmyheaded  Frequency: multiple times daily feels dizzy for variable time frames;  spinning sensation is with position changes  Duration: variable lasting from only a few seconds to 20-30 min  Aggravating factors: Induced by position  change: rolling to the right and Induced by motion: bending down to the ground  Relieving factors: head stationary  Progression of symptoms: unchanged  OCULOMOTOR EXAM:   Ocular Alignment: normal  Spontaneous Nystagmus: absent  Gaze-Induced Nystagmus: absent  Horizontal head shaking - induced nystagmus: negative, but did cause some vertigo  Vertical head shaking - induced nystagmus: negative  Positional tests: Right Dix-Hallpike: pure upbeating Left Dix-Hallpike: no nystagmus Right Roll Test: no nystagmus Left Roll Test: no nystagmus  VESTIBULAR - OCULAR REFLEX:   Slow VOR: Normal  VOR Cancellation: Corrective Saccades loses gaze stability to the R and L saccade to correct  Head-Impulse Test: HIT Right: no nystagmus, but dizziness  Dynamic Visual Acuity: Static: Line 5 (but forgot to bring glasses)    MOTION SENSITIVITY:  Motion Sensitivity Quotient Intensity: 0 = none, 1 = Lightheaded, 2 = Mild, 3 = Moderate, 4 = Severe, 5 = Vomiting  Intensity  1. Sitting to supine   2. Supine to L side   3. Supine to R side   4. Supine to sitting   5. L Hallpike-Dix   6. Up from L    7. R Hallpike-Dix   8. Up from R    9. Sitting, head tipped to L knee   10. Head up from L knee   11. Sitting, head tipped to R knee   12. Head up from R knee   13. Sitting head turns x5   14.Sitting head nods x5   15. In stance, 180 turn to L    16. In stance, 180 turn to R     OTHOSTATICS: nt  FUNCTIONAL GAIT: TBD  TREATMENT DATE:  12/05/23 NEUROMUSCULAR RE-EDUCATION: To improve vertigo symptoms and gaze stability. Horizontal smooth pursuits x 20 holding sticky note---multiple losses of focus and corrective saccades Vertical smooth pursuits x 20 sticky note--saccades present Standing Cervical retraction x 10 w/ focus on distant object Wilhelmena Cipro for cupulolithiasis x  10 each direction --pt. Is asymptomatic BBQ roll for R BPPV x 1--pt. Symptomatic on final roll back to R sidelying w/ nystagmus R Hallpike dix + followed by Canalith Repositioning Technique/treatment x 3 trials for R BPPV   MANUAL THERAPY: To promote increased ROM utilizing myofascial release. Suboccipital release x 3; grade 3-4 extension jt mobs C3-7 x 20 reps/level; paracervical MFR  12/02/23 SELF CARE: Provided education on PT POC progression/ on proper technique for CRT for R BPPV with head extended off of the bed rather than over 1 or 2 flat pillows as she has been doing;  keeping her head rotated into the proper position with each part of the maneuver and keeping neck extended throughout.   Canalith Repositioning:  Epley Right: Number of Reps: 3  no symptoms by last time   PATIENT EDUCATION: Education details: Epley R ear Person educated: Patient Education method: Explanation, Demonstration, Tactile cues, and Verbal cues Education comprehension: verbalized understanding, verbal cues required, tactile cues required, and needs further education  HOME EXERCISE PROGRAM:  Access Code: [email or web address] URL: [email or web address] Date: 12/05/2023 Prepared by: Garnette Montclair  Exercises - Standing Cervical Retraction  - 1 x daily - 7 x weekly - 3 sets - 10 reps - Seated Horizontal Smooth Pursuit  - 1 x daily - 7 x weekly - 3 sets - 10 reps - Seated Vertical Smooth Pursuit  - 1 x daily - 7 x weekly - 3 sets - 10 reps  GOALS: Goals reviewed with patient? Yes  SHORT TERM GOALS: Target date: 12/30/2023   Patient will report 50% subjective improvement in her symptoms of vertigo and dizziness Baseline: occurs always when rolling to the R and when sitting up from lying down Goal status: INITIAL  2.  Patient will demo 75% cervical ROM Baseline: see ROM tables above Goal status: INITIAL  3. Patient will be independent with initial HEP Baseline: 100% PT assist required for  correct completion Goal status: INITIAL   LONG TERM GOALS: Target date: 01/27/2024   Patient will report 75-100% subjective improvement in her vertigo/dizziness symptoms Baseline: always with rolling R and supine to sit Goal status: INITIAL  2.  Patient will be independent with advanced HEP Baseline: nmo advanced HEP yet Goal status: INITIAL  3.  Patient will exhibit score of >24/30 on FGA Baseline: TBD Goal status: INITIAL  4.  Patient will exhibit score of >/= 50/56 on BERG balance test Baseline: TBD Goal status: INITIAL  ASSESSMENT:  CLINICAL IMPRESSION: Patient again has + R hallpike Dix testing w/ R BPPV symptoms of upbeating nystagmus.   CRT x 3 seems to improve by the third trial.   She is advised to keep her chin level and avoid bending over for the rmainder of the day.   She does also have some hypofunction present for the R ear as well.  This is impacting her general dizziness throughout the day I think.   Initiated smooth pursuits with plans for gaze stabilization exercises next visit.  She really does not seem to have much cervical spine pain, but has very stiff musculature with postural deficits.  She requires further PT to address her dizziness  EVAL:  Patient is a 73  y.o. who was seen today for physical therapy evaluation and treatment for vertigo symptoms.   Patient has had these symptoms off/on over the last several years and has had PT here previously with good results from Epley maneuver.  She is not as bad this time around as before, but is still not able to roll to the R in bed or transfer in/out of bed without significant vertigo.   She has + R hallpike Dix test today with upbeating nystagmus, and responds well to initial Epley maneuver for this.  She is advised this may not fully resolve her symptoms, and that we would like to see her again for f/u.   She does also have some nystagmus with gaze stabilization today so there may be some underlying neurological deficits  as well, but it is very mild. She would benefit from PT to address dizziness, vertigo, decreased gaze stability, and some underlying balance deficits.   She is agreeable to the POC.    OBJECTIVE IMPAIRMENTS: difficulty walking and dizziness.   ACTIVITY LIMITATIONS: bending, sleeping, and locomotion level  PARTICIPATION LIMITATIONS: cleaning, laundry, and community activity  PERSONAL FACTORS: Age, Fitness, Time since onset of injury/illness/exacerbation, and 1-2 comorbidities: B TKA, L THA, lumbar fusion, HTN, asthma, OA, BPPV are also affecting patient's functional outcome.   REHAB POTENTIAL: Good  CLINICAL DECISION MAKING: Evolving/moderate complexity  EVALUATION COMPLEXITY: Moderate   PLAN:  PT FREQUENCY: 1-2x/week  PT DURATION: 8 weeks  PLANNED INTERVENTIONS: 97110-Therapeutic exercises, 97530- Therapeutic activity, [id number]- Neuromuscular re-education, 97535- Self Care, [id number]- Manual therapy, [phone]- Canalith repositioning, 20560 (1-2 muscles), 20561 (3+ muscles)- Dry Needling, Patient/Family education, Balance training, and Stair training  PLAN FOR NEXT SESSION: Reassess hallpike Dix bilaterally w/ epley as indicated.  Add gaze stabilization actrivities.     PHYSICAL THERAPY DISCHARGE SUMMARY  Visits from Start of Care: 2  Current functional level related to goals / functional outcomes: Patient called to cancel remaining appointments stating she was doing much better   Remaining deficits: Patient reports symptoms resolved   Education / Equipment: Patient is independent with all home exercises and advised to continue daily as tolerated and call us  with any questions   Patient agrees to discharge. Patient goals were met per patient report. Patient is being discharged due to not returning since the last visit.   Jovontae Banko, PT 12/07/2023, 6:02 PM

## 2023-12-05 NOTE — Telephone Encounter (Signed)
 A1C is 6.5 which is officially in diabetes range- we can continue diet/exercise and weight loss efforts.    Cholesterol is above goal.  Given diabetes diagnosis, would recommend that she add atorvastatin daily to decrease risk of heat attack and stroke.   I would like to see her back in 3 months please.

## 2023-12-05 NOTE — Telephone Encounter (Signed)
 Patient notified of results, new prescription, provider's comments and recommendations.  She will like to wait to get the 3 months follow up scheduled

## 2023-12-06 ENCOUNTER — Other Ambulatory Visit: Payer: Self-pay | Admitting: Family

## 2023-12-06 DIAGNOSIS — Z1231 Encounter for screening mammogram for malignant neoplasm of breast: Secondary | ICD-10-CM

## 2023-12-10 ENCOUNTER — Encounter: Payer: Self-pay | Admitting: Podiatry

## 2023-12-10 ENCOUNTER — Ambulatory Visit: Admitting: Podiatry

## 2023-12-10 DIAGNOSIS — L03032 Cellulitis of left toe: Secondary | ICD-10-CM

## 2023-12-10 MED ORDER — DOXYCYCLINE HYCLATE 100 MG PO TABS: 100.0000 mg | ORAL_TABLET | Freq: Two times a day (BID) | ORAL | 0 refills | Status: AC

## 2023-12-10 NOTE — Progress Notes (Signed)
 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  She states that it feels better is just looks a little infected.  Objective: Vital signs are stable alert and oriented x 3.  There is mild erythema not extending to the level of the interphalangeal joint.  There is eschar present along the fibular border.  No purulence no malodor.  Assessment: Well-healing surgical toe with mild cellulitic process.  Plan: Start her on doxycycline .  She will take this twice daily notify me if she is not improved in 1 week.  She will continue to soak covered in the day but leave open at bedtime.

## 2023-12-11 ENCOUNTER — Ambulatory Visit: Admitting: Rehabilitation

## 2023-12-12 DIAGNOSIS — J8283 Eosinophilic asthma: Secondary | ICD-10-CM | POA: Diagnosis not present

## 2023-12-12 DIAGNOSIS — H1045 Other chronic allergic conjunctivitis: Secondary | ICD-10-CM | POA: Diagnosis not present

## 2023-12-12 DIAGNOSIS — J4550 Severe persistent asthma, uncomplicated: Secondary | ICD-10-CM | POA: Diagnosis not present

## 2023-12-12 DIAGNOSIS — J3081 Allergic rhinitis due to animal (cat) (dog) hair and dander: Secondary | ICD-10-CM | POA: Diagnosis not present

## 2023-12-19 ENCOUNTER — Other Ambulatory Visit: Payer: Self-pay | Admitting: Cardiology

## 2023-12-23 ENCOUNTER — Encounter: Payer: Self-pay | Admitting: Gastroenterology

## 2023-12-23 ENCOUNTER — Ambulatory Visit: Admitting: Gastroenterology

## 2023-12-23 VITALS — BP 120/62 | HR 79 | Ht 63.0 in | Wt 186.0 lb

## 2023-12-23 DIAGNOSIS — Z9049 Acquired absence of other specified parts of digestive tract: Secondary | ICD-10-CM

## 2023-12-23 DIAGNOSIS — R140 Abdominal distension (gaseous): Secondary | ICD-10-CM | POA: Diagnosis not present

## 2023-12-23 DIAGNOSIS — K9089 Other intestinal malabsorption: Secondary | ICD-10-CM

## 2023-12-23 DIAGNOSIS — K5909 Other constipation: Secondary | ICD-10-CM

## 2023-12-23 DIAGNOSIS — K638219 Small intestinal bacterial overgrowth, unspecified: Secondary | ICD-10-CM

## 2023-12-23 MED ORDER — CHOLESTYRAMINE 4 G PO PACK: 4.0000 g | PACK | Freq: Every day | ORAL | 5 refills | Status: DC

## 2023-12-23 NOTE — Patient Instructions (Signed)
 We have sent the following medications to your pharmacy for you to pick up at your convenience: Questran 1/2-1 packet daily.   Call or send Mychart message in 3-4 weeks with update.   _______________________________________________________  If your blood pressure at your visit was 140/90 or greater, please contact your primary care physician to follow up on this.  _______________________________________________________  If you are age 73 or older, your body mass index should be between 23-30. Your Body mass index is 32.95 kg/m. If this is out of the aforementioned range listed, please consider follow up with your Primary Care Provider.  If you are age 80 or younger, your body mass index should be between 19-25. Your Body mass index is 32.95 kg/m. If this is out of the aformentioned range listed, please consider follow up with your Primary Care Provider.   ________________________________________________________  The [facility] providers would like to encourage you to use MYCHART to communicate with providers for non-urgent requests or questions.  Due to long hold times on the telephone, sending your provider a message by [facility] may be a faster and more efficient way to get a response.  Please allow 48 business hours for a response.  Please remember that this is for non-urgent requests.  _______________________________________________________  [facility] is using a team-based approach to care.  Your team is made up of your doctor and two to three APPS. Our APPS (Nurse Practitioners and Physician Assistants) work with your physician to ensure care continuity for you. They are fully qualified to address your health concerns and develop a treatment plan. They communicate directly with your gastroenterologist to care for you. Seeing the Advanced Practice Practitioners on your physician's team can help you by facilitating care more promptly, often allowing for earlier appointments,  access to diagnostic testing, procedures, and other specialty referrals.

## 2023-12-23 NOTE — Progress Notes (Signed)
 12/23/2023 Brandy Dunlap [id number] 02/06/1951  Discussed the use of AI scribe software for clinical note transcription with the patient, who gave verbal consent to proceed.  History of Present Illness Brandy Dunlap is a 73 year old female with small intestinal bacterial overgrowth who presents for follow-up after treatment.  Previously diagnosed with small intestinal bacterial overgrowth (SIBO), she underwent a breath test in early June. Treatment with Xifaxan  and Neomycin  has led to significant improvement. Abdominal pain has resolved, and bowel movements have decreased in frequency, although she still experiences multiple bowel movements in the morning, sometimes up to three or four times.  She does not have a gallbladder. She has not tried Questran (cholestyramine) before.  Bloating has improved but is not completely resolved.  She is currently not experiencing any days without bowel movements and notes that the frequency of problematic mornings has decreased since starting the medication. She has not experienced a recurrence of symptoms otherwise since her treatment in June.  EGD and colonoscopy 2022 looked good.     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   Essential hypertension 10/08/2007   Qualifier: Diagnosis of  By: Mavis MD, Norleen BRAVO    GERD (gastroesophageal reflux disease)    History of kidney  stones    per 09-02-2018 pre-op eval , suspected kidney stone , to [facility] CT abd on 7-9- for further eval , passive   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   History of total knee arthroplasty 11/16/2013   Hypertension    IDA (iron  deficiency anemia)    Infectious disease 05/23/2015   Low back pain    Osteoarthritis    Pneumonia    Pre-diabetes    Prediabetes 07/09/2016   Referred otalgia of left ear 03/26/2016   Sinus trouble    Past Surgical History:  Procedure Laterality Date   CHOLECYSTECTOMY     COLONOSCOPY     COLONOSCOPY W/ BIOPSIES  10/06/2003   left knee replacement Left 05/2021   LUMBAR FUSION  2008   LUMBAR FUSION     2020   PARTIAL HIP ARTHROPLASTY Right 2009    hip replacement   TONSILLECTOMY     TOTAL HIP ARTHROPLASTY Left 09/10/2018   Procedure: TOTAL HIP ARTHROPLASTY ANTERIOR APPROACH;  Surgeon: Melodi Lerner, MD;  Location: [facility];  Service: Orthopedics;  Laterality: Left;   TOTAL KNEE ARTHROPLASTY Right 11/16/2013   Procedure: RIGHT TOTAL KNEE ARTHROPLASTY;  Surgeon: Marcey Raman, MD;  Location: [facility];  Service: Orthopedics;  Laterality: Right;   VIDEO BRONCHOSCOPY Bilateral 12/05/2012   Procedure: VIDEO BRONCHOSCOPY WITHOUT FLUORO;  Surgeon: Lamar GORMAN Chris, MD;  Location: [facility];  Service: Cardiopulmonary;  Laterality: Bilateral;    reports that she has never smoked. She has never been exposed to tobacco smoke. She has never used smokeless  tobacco. She reports that she does not drink alcohol and does not use drugs. family history includes Breast cancer in her maternal aunt; COPD in her father and mother; Cancer in her mother; Emphysema in her father and mother; Heart Problems in her father and mother; Heart disease in her brother and mother; Hyperlipidemia in her mother; Hypertension in her mother; Ovarian cancer in her mother; Stroke in her mother; Thyroid  disease in her mother. Allergies  Allergen Reactions   Augmentin  [Amoxicillin -Pot  Clavulanate] Diarrhea   Pantoprazole Sodium Palpitations      Outpatient Encounter Medications as of 12/23/2023  Medication Sig   AIRSUPRA 90-80 MCG/ACT AERO INHALE 2 PUFFS AS NEEDED UP TO 12 PUFFS A DAY; Duration: 30   amLODipine  (NORVASC ) 5 MG tablet TAKE 1 TABLET BY MOUTH DAILY. KEEP APPOINTMENT ON 8/13 WITH DR. BERNIE TO RECEIVE FUTURE REFILLS   atorvastatin (LIPITOR) 20 MG tablet Take 1 tablet (20 mg total) by mouth daily.   benzonatate  (TESSALON ) 100 MG capsule Take 100-200 mg by mouth every 6 (six) hours as needed.   budesonide -formoterol  (SYMBICORT ) 160-4.5 MCG/ACT inhaler Inhale 2 puffs into the lungs 2 (two) times daily.   DEXILANT  60 MG capsule TAKE 1 CAPSULE BY MOUTH EVERY DAY   doxycycline  (VIBRA -TABS) 100 MG tablet Take 1 tablet (100 mg total) by mouth 2 (two) times daily.   EPINEPHrine  0.3 mg/0.3 mL IJ SOAJ injection Inject 0.3 mg into the muscle as needed for anaphylaxis.   FASENRA  PEN 30 MG/ML prefilled autoinjector 1 injection every 4 weeks x3 doses, then q8 weeks thereafter Subcutaneous every 4 weeks x3 doses, then q8 weeks thereafter for 30 days   hydrochlorothiazide  (MICROZIDE ) 12.5 MG capsule TAKE 1 CAPSULE BY MOUTH EVERY DAY   meclizine  (ANTIVERT ) 12.5 MG tablet Take 1 tablet (12.5 mg total) by mouth 3 (three) times daily as needed for dizziness.   meloxicam  (MOBIC ) 15 MG tablet Take 15 mg by mouth daily.   montelukast  (SINGULAIR ) 10 MG tablet TAKE 1 TABLET EVERY EVENING Orally Once a day; Duration: 30 days   traMADol  (ULTRAM ) 50 MG tablet Take 50-100 mg by mouth every 6 (six) hours as needed for pain.   No facility-administered encounter medications on file as of 12/23/2023.    REVIEW OF SYSTEMS  : All other systems reviewed and negative except where noted in the History of Present Illness.   PHYSICAL EXAM: BP 120/62 (BP Location: Right Arm, Patient Position: Sitting, Cuff Size: Normal)   Pulse 79   Ht 5' 3 (1.6 m)   Wt 186 lb (84.4 kg)   BMI 32.95 kg/m   General: Well developed white female in no acute distress Head: Normocephalic and atraumatic Eyes:  Sclerae anicteric, conjunctiva pink. Ears: Normal auditory acuity Lungs: Clear throughout to auscultation; no W/R/R. Heart: Regular rate and rhythm; no M/R/G. Abdomen: Soft, non-distended.  BS present.  Non-tender.  Ventral hernia/diastasis recti noted. Musculoskeletal: Symmetrical with no gross deformities  Skin: No lesions on visible extremities Extremities: No edema  Neurological: Alert oriented x 4, grossly non-focal Psychological:  Alert and cooperative. Normal mood and affect Assessment & Plan Small intestinal bacterial overgrowth (SIBO) SIBO was treated with Xifaxan  and Neomycin , resulting in significant improvement. Symptoms have not recurred since treatment in July. SIBO can be recurrent. - Empirically treat with antibiotics if symptoms recur.  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 Improvement noted, but frequent morning bowel movements continue. - Prescribe Questran (  cholestyramine) to bind bile salts and reduce diarrhea. - Start with half a pack daily, potentially increasing to a full pack if needed. - Take Questran 2-3 hours apart from other medications to avoid binding interactions. - Mix Questran with applesauce for better palatability. - Follow-up via MyChart in 3-4 weeks to report on the effectiveness of Questran.  Abdominal bloating and gas Abdominal bloating and gas have improved with SIBO but are not completely resolved. Symptoms are likely related to dietary intake. - Discuss dietary modifications to reduce bloating and gas, such as avoiding beans and certain raw vegetables.    CC:  Daryl Setter, NP

## 2023-12-24 ENCOUNTER — Ambulatory Visit (INDEPENDENT_AMBULATORY_CARE_PROVIDER_SITE_OTHER)

## 2023-12-24 DIAGNOSIS — Z23 Encounter for immunization: Secondary | ICD-10-CM

## 2023-12-24 NOTE — Progress Notes (Signed)
 Pt was in office today for Flu vaccine, vaccine was given in L deltoid and pt tolerated well.

## 2023-12-31 ENCOUNTER — Ambulatory Visit
Admission: RE | Admit: 2023-12-31 | Discharge: 2023-12-31 | Disposition: A | Discharge status: Home / Self Care | Source: Ambulatory Visit | Attending: Family | Admitting: Family

## 2023-12-31 DIAGNOSIS — Z1231 Encounter for screening mammogram for malignant neoplasm of breast: Secondary | ICD-10-CM

## 2024-01-02 ENCOUNTER — Ambulatory Visit: Payer: Self-pay | Admitting: Family

## 2024-01-07 ENCOUNTER — Encounter: Payer: Self-pay | Admitting: Family

## 2024-01-07 DIAGNOSIS — R10A0 Flank pain, unspecified side: Secondary | ICD-10-CM

## 2024-01-07 DIAGNOSIS — N3000 Acute cystitis without hematuria: Secondary | ICD-10-CM

## 2024-01-07 NOTE — Telephone Encounter (Signed)
 Can you please contact pt and put her on tomorrow's lab schedule for urine testing?

## 2024-01-08 ENCOUNTER — Other Ambulatory Visit (INDEPENDENT_AMBULATORY_CARE_PROVIDER_SITE_OTHER)

## 2024-01-08 DIAGNOSIS — J4540 Moderate persistent asthma, uncomplicated: Secondary | ICD-10-CM | POA: Diagnosis not present

## 2024-01-08 DIAGNOSIS — R10A0 Flank pain, unspecified side: Secondary | ICD-10-CM | POA: Diagnosis not present

## 2024-01-08 DIAGNOSIS — R300 Dysuria: Secondary | ICD-10-CM | POA: Diagnosis not present

## 2024-01-08 MED ORDER — CEPHALEXIN 500 MG PO CAPS: 500.0000 mg | ORAL_CAPSULE | Freq: Two times a day (BID) | ORAL | 0 refills | Status: AC

## 2024-01-09 LAB — URINALYSIS, ROUTINE W REFLEX MICROSCOPIC
Bilirubin Urine: NEGATIVE
Ketones, ur: NEGATIVE
Leukocytes,Ua: NEGATIVE
Nitrite: NEGATIVE
RBC / HPF: NONE SEEN (ref 0–?)
Specific Gravity, Urine: 1.02 (ref 1.000–1.030)
Total Protein, Urine: NEGATIVE
Urine Glucose: NEGATIVE
Urobilinogen, UA: 0.2 (ref 0.0–1.0)
pH: 5.5 (ref 5.0–8.0)

## 2024-01-10 LAB — URINE CULTURE
MICRO NUMBER:: 17225585
SPECIMEN QUALITY:: ADEQUATE

## 2024-01-27 DIAGNOSIS — J3081 Allergic rhinitis due to animal (cat) (dog) hair and dander: Secondary | ICD-10-CM | POA: Diagnosis not present

## 2024-01-27 DIAGNOSIS — J019 Acute sinusitis, unspecified: Secondary | ICD-10-CM | POA: Diagnosis not present

## 2024-01-27 DIAGNOSIS — J8283 Eosinophilic asthma: Secondary | ICD-10-CM | POA: Diagnosis not present

## 2024-01-27 DIAGNOSIS — J4550 Severe persistent asthma, uncomplicated: Secondary | ICD-10-CM | POA: Diagnosis not present

## 2024-01-28 ENCOUNTER — Ambulatory Visit: Admitting: Family

## 2024-02-13 ENCOUNTER — Encounter: Payer: Self-pay | Admitting: Student

## 2024-02-13 ENCOUNTER — Ambulatory Visit: Admitting: Student

## 2024-02-13 VITALS — BP 135/71 | HR 77 | Temp 98.2°F | Ht 63.0 in | Wt 185.8 lb

## 2024-02-13 DIAGNOSIS — J069 Acute upper respiratory infection, unspecified: Secondary | ICD-10-CM

## 2024-02-13 MED ORDER — PROMETHAZINE-DM 6.25-15 MG/5ML PO SYRP: 5.0000 mL | ORAL_SOLUTION | Freq: Four times a day (QID) | ORAL | 0 refills | Status: AC | PRN

## 2024-02-13 NOTE — Progress Notes (Signed)
 Chief Complaint  Patient presents with   Cough   Sore Throat    Started Sunday.  Mucinex , herbal teas, and tylenol  cough and cold.  Neg covid test on Sun.    Discussed the use of AI scribe software for clinical note transcription with the patient, who gave verbal consent to proceed.   HPI  Brandy Dunlap presents  for URI complaints.  Her symptoms began four days ago with a sore throat and cough causing poor sleep and fatigue. The sore throat and cough have improved but persist. Difficulty sleeping at night due to cough.  She has had nasal congestion, sneezing, frequent nose blowing, and transient soreness of the upper mouth, all now decreasing. Denies SOB, wheezing, or fever.She has been using Tylenol  for cough and congestion, Mucinex , hot tea with honey, and Tessalon  Perles, which she feels are not strong enough for her nighttime cough.does not smoke and has asthma that is well controlled with Faserna injections every 56 days.  Denies recent travel. Denies sick contacts. Home Covid test negative.  Patient denies fever, chills, SOB, CP, palpitations, dyspnea, edema, HA, vision changes, N/V/D, abdominal pain, urinary symptoms, rash, weight changes, and recent illness or hospitalizations.   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   Essential hypertension 10/08/2007   Qualifier: Diagnosis of  By: Mavis MD, Norleen BRAVO    GERD  (gastroesophageal reflux disease)    History of kidney stones    per 09-02-2018 pre-op eval , suspected kidney stone , to [facility] abd on 7-9- for further eval , passive   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   History of total knee arthroplasty 11/16/2013   Hypertension    IDA (iron  deficiency anemia)    Infectious disease 05/23/2015   Low back pain    Osteoarthritis    Pneumonia    Pre-diabetes    Prediabetes 07/09/2016   Referred otalgia of left ear 03/26/2016   Sinus trouble     Objective BP 135/71   Pulse 77   Temp 98.2 F (36.8 C)   Ht 5' 3 (1.6 m)   Wt 185 lb 12.8 oz (84.3 kg)   SpO2 98%   BMI 32.91 kg/m  General: Awake, alert, appears stated age HEENT: AT, [place], ears patent b/l and TM's neg, nares patent w/o discharge, pharynx pink and without exudates, MMM Neck: No masses or asymmetry Heart: RRR Lungs: CTAB, no accessory muscle use Psych: Age appropriate judgment and insight, normal mood and affect  Viral URI with cough  Likely viral etiology with expected resolution in 7-10 days.  - Prescribed promethazine - dextromethorphan  syrup for nighttime cough symptoms - Continue Tessalon  Perles during the day. - Use honey and tea, Chloraseptic spray or Cepacol lozenges for soar throat - Report worsening symptoms or if symptoms persist past 10 days. Pt educated on  viral vs bacterial URI  Continue to push fluids, practice good hand hygiene, cover mouth when coughing. F/u prn. If starting to experience fevers, shaking, or shortness of breath, seek immediate care. Pt voiced understanding and agreement to the plan.  Harlene LITTIE Jolly, DNP, AGNP-C 02/13/2024 11:26 AM

## 2024-03-10 ENCOUNTER — Other Ambulatory Visit: Payer: Self-pay | Admitting: Family

## 2024-03-10 DIAGNOSIS — H8112 Benign paroxysmal vertigo, left ear: Secondary | ICD-10-CM

## 2024-03-18 ENCOUNTER — Ambulatory Visit: Admitting: Family

## 2024-03-18 VITALS — BP 127/55 | HR 73 | Temp 97.4°F | Resp 16 | Ht 63.0 in | Wt 187.0 lb

## 2024-03-18 DIAGNOSIS — H8110 Benign paroxysmal vertigo, unspecified ear: Secondary | ICD-10-CM

## 2024-03-18 DIAGNOSIS — J4540 Moderate persistent asthma, uncomplicated: Secondary | ICD-10-CM

## 2024-03-18 DIAGNOSIS — I10 Essential (primary) hypertension: Secondary | ICD-10-CM | POA: Diagnosis not present

## 2024-03-18 DIAGNOSIS — E785 Hyperlipidemia, unspecified: Secondary | ICD-10-CM | POA: Diagnosis not present

## 2024-03-18 DIAGNOSIS — M545 Low back pain, unspecified: Secondary | ICD-10-CM | POA: Diagnosis not present

## 2024-03-18 DIAGNOSIS — E119 Type 2 diabetes mellitus without complications: Secondary | ICD-10-CM | POA: Diagnosis not present

## 2024-03-18 LAB — MICROALBUMIN / CREATININE URINE RATIO
Creatinine,U: 145.3 mg/dL
Microalb Creat Ratio: 6.5 mg/g (ref 0.0–30.0)
Microalb, Ur: 1 mg/dL (ref 0.7–1.9)

## 2024-03-18 LAB — URINALYSIS, ROUTINE W REFLEX MICROSCOPIC
Bilirubin Urine: NEGATIVE
Ketones, ur: NEGATIVE
Leukocytes,Ua: NEGATIVE
Nitrite: NEGATIVE
Specific Gravity, Urine: 1.025 (ref 1.000–1.030)
Total Protein, Urine: NEGATIVE
Urine Glucose: NEGATIVE
Urobilinogen, UA: 0.2 (ref 0.0–1.0)
pH: 6 (ref 5.0–8.0)

## 2024-03-18 LAB — COMPREHENSIVE METABOLIC PANEL WITH GFR
ALT: 35 U/L (ref 3–35)
AST: 40 U/L — ABNORMAL HIGH (ref 5–37)
Albumin: 4.1 g/dL (ref 3.5–5.2)
Alkaline Phosphatase: 45 U/L (ref 39–117)
BUN: 14 mg/dL (ref 6–23)
CO2: 30 meq/L (ref 19–32)
Calcium: 9.2 mg/dL (ref 8.4–10.5)
Chloride: 100 meq/L (ref 96–112)
Creatinine, Ser: 0.8 mg/dL (ref 0.40–1.20)
GFR: 73.12 mL/min
Glucose, Bld: 109 mg/dL — ABNORMAL HIGH (ref 70–99)
Potassium: 3.4 meq/L — ABNORMAL LOW (ref 3.5–5.1)
Sodium: 139 meq/L (ref 135–145)
Total Bilirubin: 0.8 mg/dL (ref 0.2–1.2)
Total Protein: 6.8 g/dL (ref 6.0–8.3)

## 2024-03-18 LAB — HEMOGLOBIN A1C: Hgb A1c MFr Bld: 6.4 % (ref 4.6–6.5)

## 2024-03-18 NOTE — Patient Instructions (Signed)
" °  VISIT SUMMARY: During your visit, we discussed your worsening vertigo, recent start of Lipitor, well-controlled blood pressure, and concerns about a possible urinary tract infection. We also reviewed your asthma management and physical therapy for vertigo.  YOUR PLAN: -BENIGN PAROXYSMAL POSITIONAL VERTIGO: This condition involves brief episodes of dizziness related to changes in head position. You should continue taking Meclizine  as needed for dizziness. We recommend using Flonase  and Claritin  for 1-2 weeks to help with inner ear drainage. You have been referred to a new physical therapist in [place] for further management.  -DYSLIPIDEMIA: This condition involves having high levels of lipids (fats) in your blood. You should continue taking Lipitor as prescribed. We will continue to monitor your lipid levels.  -ESSENTIAL HYPERTENSION: This condition involves high blood pressure. Your blood pressure is well-controlled at 127/55 mmHg. No changes to your current treatment are needed.  -POSSIBLE URINARY TRACT INFECTION: You have reported right-sided back pain, which can be an early sign of a urinary tract infection. We have ordered kidney function tests and blood work to evaluate for a urinary tract infection.  INSTRUCTIONS: Please follow up with the new physical therapist in [place] for your vertigo. Continue taking your medications as prescribed. Use Flonase  and Claritin  for 1-2 weeks for inner ear drainage. We will contact you with the results of your kidney function tests and blood work.    Contains text generated by [employer].   "

## 2024-03-18 NOTE — Assessment & Plan Note (Signed)
 On atorvastatin , tolerating well. Update lipid panel.

## 2024-03-18 NOTE — Assessment & Plan Note (Signed)
 Stable/improved on Fasenra , symbicort , singulair . Continue same.

## 2024-03-18 NOTE — Progress Notes (Signed)
 "  Subjective:     Patient ID: Brandy Dunlap, female    DOB: 07-05-1950, 74 y.o.   MRN: [id number]  Chief Complaint  Patient presents with   Hyperlipidemia    Here for follow up   Dizziness    Patient reports symptoms of vertigo coming back    HPI  Discussed the use of AI scribe software for clinical note transcription with the patient, who gave verbal consent to proceed. \   History of Present Illness Brandy Dunlap is a 74 year old female who presents with worsening vertigo.  She has been experiencing vertigo for the past two weeks, with symptoms fluctuating daily. The vertigo worsened after contracting a viral illness that lasted ten days. Despite performing exercises to manage the vertigo, she finds them ineffective this time. She has been attending physical therapy. Meclizine  has been effective in managing her dizziness without causing drowsiness, and she recently had a refill of this medication.  She experiences vertigo episodes approximately once a year, with increasing frequency over the past year. She started taking Lipitor recently and reports no side effects. This is her first experience with a statin, and she has been on it for a couple of weeks.  She expresses concern about a possible urinary tract infection, noting right-sided back pain as an early sign, although she denies burning sensation during urination. She experienced back pain while antiquing with a friend recently.  Her new asthma medication, Fasenra , has been effective, and she has not experienced any asthma-related problems since starting it.      Health Maintenance Due  Topic Date Due   FOOT EXAM  Never done   Diabetic kidney evaluation - Urine ACR  Never done   Hepatitis C Screening  Never done   Zoster Vaccines- Shingrix  (1 of 2) 11/01/2000   DTaP/Tdap/Td (3 - Td or Tdap) 10/31/2020   OPHTHALMOLOGY EXAM  01/07/2021   COVID-19 Vaccine (4 - 2025-26 season) 10/28/2023    Past Medical  History:  Diagnosis Date   Acute pain of left knee 10/02/2019   Allergic rhinitis 06/29/2009   Qualifier: Diagnosis of  By: Arbutus Noon     Allergic rhinitis due to animal (cat) (dog) hair and dander 07/07/2020   Allergy     Asthma    Atypical chest pain 04/18/2017   B12 deficiency    Back pain    Body mass index (BMI) 35.0-35.9, adult 05/01/2019   Bronchitis    hx   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   Chronic neck pain 03/01/2015   Colon polyp 10/06/2003   COVID-19 virus infection 01/31/2022   Depressive disorder 10/01/2016   PT DENIES   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   Essential hypertension 10/08/2007   Qualifier: Diagnosis of  By: Mavis MD, Norleen BRAVO    GERD (gastroesophageal reflux disease)    History of kidney stones    per 09-02-2018 pre-op eval , suspected kidney stone , to [facility] abd on 7-9- for further eval , passive   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   History of total knee arthroplasty 11/16/2013   Hypertension    IDA (iron  deficiency anemia)    Infectious disease 05/23/2015   Low back pain    Osteoarthritis    Pneumonia    Pre-diabetes    Prediabetes 07/09/2016   Referred otalgia of left ear 03/26/2016   Sinus trouble     Past Surgical History:  Procedure Laterality Date   CHOLECYSTECTOMY     COLONOSCOPY  COLONOSCOPY W/ BIOPSIES  10/06/2003   left knee replacement Left 05/2021   LUMBAR FUSION  2008   LUMBAR FUSION     2020   PARTIAL HIP ARTHROPLASTY Right 2009    hip replacement   TONSILLECTOMY     TOTAL HIP ARTHROPLASTY Left 09/10/2018   Procedure: TOTAL HIP ARTHROPLASTY ANTERIOR APPROACH;  Surgeon: Melodi Lerner, MD;  Location: [facility];  Service: Orthopedics;  Laterality: Left;   TOTAL KNEE ARTHROPLASTY Right 11/16/2013   Procedure: RIGHT TOTAL KNEE ARTHROPLASTY;  Surgeon: Marcey Raman, MD;  Location: [facility];  Service: Orthopedics;  Laterality: Right;   VIDEO BRONCHOSCOPY Bilateral 12/05/2012   Procedure: VIDEO  BRONCHOSCOPY WITHOUT FLUORO;  Surgeon: Lamar GORMAN Chris, MD;  Location: [facility];  Service: Cardiopulmonary;  Laterality: Bilateral;    Family History  Problem Relation Age of Onset   COPD Mother    Heart disease Mother    Ovarian cancer Mother    Emphysema Mother    Hypertension Mother    Hyperlipidemia Mother    Heart Problems Mother    Stroke Mother    Thyroid  disease Mother    Cancer Mother    COPD Father        died at age 49   Emphysema Father    Heart Problems Father    Heart disease Brother        stent with 90%   Breast cancer Maternal Aunt    Colon cancer Neg Hx    Esophageal cancer Neg Hx    Rectal cancer Neg Hx    Stomach cancer Neg Hx    Colon polyps Neg Hx     Social History   Socioeconomic History   Marital status: Single    Spouse name: Not on file   Number of children: 1   Years of education: Not on file   Highest education level: Bachelor's degree (e.g., BA, AB, BS)  Occupational History   Occupation: Magazine Features Editor:  [employer]  Tobacco Use   Smoking status: Never    Passive exposure: Never   Smokeless tobacco: Never  Vaping Use   Vaping status: Never Used  Substance and Sexual Activity   Alcohol use: No   Drug use: No   Sexual activity: Not Currently    Partners: Male    Birth control/protection: Post-menopausal  Other Topics Concern   Not on file  Social History Narrative   Divorced   One daughter- lives locally and one grandson   Retired runner, broadcasting/film/video,  Has masters degree   Enjoys reading, spending time with her grandson   Social Drivers of Health   Tobacco Use: Low Risk (02/13/2024)   Patient History    Smoking Tobacco Use: Never    Smokeless Tobacco Use: Never    Passive Exposure: Never  Financial Resource Strain: Low Risk (02/12/2024)   Overall Financial Resource Strain (CARDIA)    Difficulty of Paying Living Expenses: Not hard at all  Food Insecurity: No Food Insecurity (02/12/2024)   Epic    Worried About  Radiation Protection Practitioner of Food in the Last Year: Never true    Ran Out of Food in the Last Year: Never true  Transportation Needs: No Transportation Needs (02/12/2024)   Epic    Lack of Transportation (Medical): No    Lack of Transportation (Non-Medical): No  Physical Activity: Insufficiently Active (02/12/2024)   Exercise Vital Sign    Days of Exercise per Week: 3 days    Minutes of Exercise  per Session: 40 min  Stress: No Stress Concern Present (02/12/2024)   [employer] of Occupational Health - Occupational Stress Questionnaire    Feeling of Stress: Not at all  Social Connections: Moderately Isolated (02/12/2024)   Social Connection and Isolation Panel    Frequency of Communication with Friends and Family: More than three times a week    Frequency of Social Gatherings with Friends and Family: Twice a week    Attends Religious Services: Patient declined    Active Member of Clubs or Organizations: Yes    Attends Engineer, Structural: More than 4 times per year    Marital Status: Divorced  Intimate Partner Violence: Not At Risk (09/03/2023)   Epic    Fear of Current or Ex-Partner: No    Emotionally Abused: No    Physically Abused: No    Sexually Abused: No  Depression (PHQ2-9): Low Risk (02/13/2024)   Depression (PHQ2-9)    PHQ-2 Score: 0  Alcohol Screen: Low Risk (09/03/2023)   Alcohol Screen    Last Alcohol Screening Score (AUDIT): 0  Housing: Unknown (02/12/2024)   Epic    Unable to Pay for Housing in the Last Year: No    Number of Times Moved in the Last Year: Not on file    Homeless in the Last Year: No  Utilities: Not At Risk (09/03/2023)   Epic    Threatened with loss of utilities: No  Health Literacy: Adequate Health Literacy (09/03/2023)   B1300 Health Literacy    Frequency of need for help with medical instructions: Never    Outpatient Medications Prior to Visit  Medication Sig Dispense Refill   AIRSUPRA 90-80 MCG/ACT AERO INHALE 2 PUFFS AS NEEDED UP TO 12 PUFFS  A DAY; Duration: 30     amLODipine  (NORVASC ) 5 MG tablet TAKE 1 TABLET BY MOUTH DAILY. KEEP APPOINTMENT ON 8/13 WITH DR. BERNIE TO RECEIVE FUTURE REFILLS 90 tablet 3   atorvastatin  (LIPITOR) 20 MG tablet Take 1 tablet (20 mg total) by mouth daily. 90 tablet 1   benzonatate  (TESSALON ) 100 MG capsule Take 100-200 mg by mouth every 6 (six) hours as needed.     budesonide -formoterol  (SYMBICORT ) 160-4.5 MCG/ACT inhaler Inhale 2 puffs into the lungs 2 (two) times daily.     DEXILANT  60 MG capsule TAKE 1 CAPSULE BY MOUTH EVERY DAY 90 capsule 1   doxycycline  (VIBRA -TABS) 100 MG tablet Take 1 tablet (100 mg total) by mouth 2 (two) times daily. 10 tablet 0   EPINEPHrine  0.3 mg/0.3 mL IJ SOAJ injection Inject 0.3 mg into the muscle as needed for anaphylaxis.     FASENRA  PEN 30 MG/ML prefilled autoinjector 1 injection every 4 weeks x3 doses, then q8 weeks thereafter Subcutaneous every 4 weeks x3 doses, then q8 weeks thereafter for 30 days     hydrochlorothiazide  (MICROZIDE ) 12.5 MG capsule TAKE 1 CAPSULE BY MOUTH EVERY DAY 90 capsule 3   meclizine  (ANTIVERT ) 12.5 MG tablet TAKE 1 TABLET BY MOUTH 3 TIMES DAILY AS NEEDED FOR DIZZINESS. 30 tablet 0   meloxicam  (MOBIC ) 15 MG tablet Take 15 mg by mouth daily.     montelukast  (SINGULAIR ) 10 MG tablet TAKE 1 TABLET EVERY EVENING Orally Once a day; Duration: 30 days     promethazine -dextromethorphan  (PROMETHAZINE -DM) 6.25-15 MG/5ML syrup Take 5 mLs by mouth 4 (four) times daily as needed for cough. 180 mL 0   traMADol  (ULTRAM ) 50 MG tablet Take 50-100 mg by mouth every 6 (six) hours as needed for  pain.     cholestyramine  (QUESTRAN ) 4 g packet Take 1 packet (4 g total) by mouth daily. 30 each 5   No facility-administered medications prior to visit.    Allergies[1]  ROS   See HPI Objective:    Physical Exam Constitutional:      General: She is not in acute distress.    Appearance: Normal appearance. She is well-developed.  HENT:     Head: Normocephalic  and atraumatic.     Right Ear: Tympanic membrane, ear canal and external ear normal.     Left Ear: Ear canal and external ear normal.     Ears:     Comments: Clear fluid noted behind left TM, no erythema or bulging. Eyes:     General: No scleral icterus. Neck:     Thyroid : No thyromegaly.  Cardiovascular:     Rate and Rhythm: Normal rate and regular rhythm.     Heart sounds: Normal heart sounds. No murmur heard. Pulmonary:     Effort: Pulmonary effort is normal. No respiratory distress.     Breath sounds: Normal breath sounds. No wheezing.  Musculoskeletal:     Cervical back: Neck supple.  Skin:    General: Skin is warm and dry.  Neurological:     Mental Status: She is alert and oriented to person, place, and time.  Psychiatric:        Mood and Affect: Mood normal.        Behavior: Behavior normal.        Thought Content: Thought content normal.        Judgment: Judgment normal.      BP (!) 127/55 (BP Location: Right Arm, Patient Position: Sitting, Cuff Size: Large)   Pulse 73   Temp (!) 97.4 F (36.3 C) (Oral)   Resp 16   Ht 5' 3 (1.6 m)   Wt 187 lb (84.8 kg)   SpO2 96%   BMI 33.13 kg/m  Wt Readings from Last 3 Encounters:  03/18/24 187 lb (84.8 kg)  02/13/24 185 lb 12.8 oz (84.3 kg)  12/23/23 186 lb (84.4 kg)       Assessment & Plan:   Problem List Items Addressed This Visit       Unprioritized   Moderate persistent asthma, uncomplicated   Stable/improved on Fasenra , symbicort , singulair . Continue same.       Low back pain   Reports that she often has low back pain prior to UTI.  Has hx of kidney stones. Pain is mild. Check UA and culture. Consider imaging if pain worsens or if microscopic hematuria on UA.      Relevant Orders   Urine Culture   Urinalysis, Routine w reflex microscopic   Essential hypertension   BP Readings from Last 3 Encounters:  03/18/24 (!) 127/55  02/13/24 135/71  12/23/23 120/62   BP stable on amlodipine  and  hydrochlorothiazide .       Relevant Orders   Comp Met (CMET)   Dyslipidemia   On atorvastatin , tolerating well. Update lipid panel.       Controlled type 2 diabetes mellitus without complication, without long-term current use of insulin  (HCC)   Relevant Orders   HgB A1c   Urine Microalbumin w/creat. ratio   Benign paroxysmal positional vertigo - Primary   Refer to Vestibular rehab, continue meclizine  prn. Some serous effusion L ear- recommend claritin  flonase  to promote drainage. (Recently had viral URI- suspect this is residual).      Relevant Orders  Ambulatory referral to Physical Therapy    I have discontinued Janashia G. Leard's cholestyramine . I am also having her maintain her budesonide -formoterol , traMADol , EPINEPHrine , Fasenra  Pen, Dexilant , amLODipine , Airsupra, meloxicam , montelukast , atorvastatin , doxycycline , hydrochlorothiazide , benzonatate , promethazine -dextromethorphan , and meclizine .  No orders of the defined types were placed in this encounter.     [1]  Allergies Allergen Reactions   Augmentin  [Amoxicillin -Pot Clavulanate] Diarrhea   Pantoprazole Sodium Palpitations   "

## 2024-03-18 NOTE — Assessment & Plan Note (Addendum)
 Refer to Vestibular rehab, continue meclizine  prn. Some serous effusion L ear- recommend claritin  flonase  to promote drainage. (Recently had viral URI- suspect this is residual).

## 2024-03-18 NOTE — Assessment & Plan Note (Addendum)
 BP Readings from Last 3 Encounters:  03/18/24 (!) 127/55  02/13/24 135/71  12/23/23 120/62   BP stable on amlodipine  and hydrochlorothiazide .

## 2024-03-18 NOTE — Assessment & Plan Note (Signed)
 Reports that she often has low back pain prior to UTI.  Has hx of kidney stones. Pain is mild. Check UA and culture. Consider imaging if pain worsens or if microscopic hematuria on UA.

## 2024-03-19 ENCOUNTER — Encounter: Payer: Self-pay | Admitting: Family

## 2024-03-20 ENCOUNTER — Ambulatory Visit: Payer: Self-pay | Admitting: Family

## 2024-03-20 LAB — URINE CULTURE
MICRO NUMBER:: 17496813
SPECIMEN QUALITY:: ADEQUATE

## 2024-03-23 ENCOUNTER — Encounter: Payer: Self-pay | Admitting: Family

## 2024-03-25 ENCOUNTER — Other Ambulatory Visit: Payer: Self-pay

## 2024-03-25 DIAGNOSIS — E785 Hyperlipidemia, unspecified: Secondary | ICD-10-CM

## 2024-03-27 ENCOUNTER — Ambulatory Visit

## 2024-04-01 ENCOUNTER — Ambulatory Visit: Admitting: Family

## 2024-04-03 ENCOUNTER — Encounter: Payer: Self-pay | Admitting: Family

## 2024-04-06 ENCOUNTER — Other Ambulatory Visit

## 2024-04-15 ENCOUNTER — Ambulatory Visit: Admitting: Physical Therapy

## 2024-06-11 ENCOUNTER — Other Ambulatory Visit

## 2024-06-11 ENCOUNTER — Ambulatory Visit: Admitting: Hematology and Oncology

## 2024-07-17 ENCOUNTER — Ambulatory Visit: Admitting: Family

## 2024-09-08 ENCOUNTER — Ambulatory Visit
